# Patient Record
Sex: Male | Born: 1945
Health system: Southern US, Community
[De-identification: ages and names within clinical notes are randomized; demographics above are authoritative.]

## PROBLEM LIST (undated history)

## (undated) DIAGNOSIS — Z800 Family history of malignant neoplasm of digestive organs: Secondary | ICD-10-CM

## (undated) DIAGNOSIS — E785 Hyperlipidemia, unspecified: Secondary | ICD-10-CM

## (undated) DIAGNOSIS — Z8042 Family history of malignant neoplasm of prostate: Secondary | ICD-10-CM

## (undated) DIAGNOSIS — E119 Type 2 diabetes mellitus without complications: Secondary | ICD-10-CM

## (undated) DIAGNOSIS — G709 Myoneural disorder, unspecified: Secondary | ICD-10-CM

## (undated) DIAGNOSIS — N189 Chronic kidney disease, unspecified: Secondary | ICD-10-CM

## (undated) DIAGNOSIS — Z5189 Encounter for other specified aftercare: Secondary | ICD-10-CM

## (undated) DIAGNOSIS — J439 Emphysema, unspecified: Secondary | ICD-10-CM

## (undated) DIAGNOSIS — T7840XA Allergy, unspecified, initial encounter: Secondary | ICD-10-CM

## (undated) DIAGNOSIS — N400 Enlarged prostate without lower urinary tract symptoms: Secondary | ICD-10-CM

## (undated) DIAGNOSIS — M1990 Unspecified osteoarthritis, unspecified site: Secondary | ICD-10-CM

## (undated) DIAGNOSIS — F419 Anxiety disorder, unspecified: Secondary | ICD-10-CM

## (undated) DIAGNOSIS — N200 Calculus of kidney: Secondary | ICD-10-CM

## (undated) DIAGNOSIS — H269 Unspecified cataract: Secondary | ICD-10-CM

## (undated) DIAGNOSIS — I6501 Occlusion and stenosis of right vertebral artery: Secondary | ICD-10-CM

## (undated) DIAGNOSIS — H409 Unspecified glaucoma: Secondary | ICD-10-CM

## (undated) DIAGNOSIS — R011 Cardiac murmur, unspecified: Secondary | ICD-10-CM

## (undated) DIAGNOSIS — R569 Unspecified convulsions: Secondary | ICD-10-CM

## (undated) DIAGNOSIS — C44310 Basal cell carcinoma of skin of unspecified parts of face: Secondary | ICD-10-CM

## (undated) DIAGNOSIS — Z87442 Personal history of urinary calculi: Secondary | ICD-10-CM

## (undated) DIAGNOSIS — K219 Gastro-esophageal reflux disease without esophagitis: Secondary | ICD-10-CM

## (undated) DIAGNOSIS — I6529 Occlusion and stenosis of unspecified carotid artery: Secondary | ICD-10-CM

## (undated) HISTORY — DX: Unspecified glaucoma: H40.9

## (undated) HISTORY — DX: Hyperlipidemia, unspecified: E78.5

## (undated) HISTORY — DX: Type 2 diabetes mellitus without complications: E11.9

## (undated) HISTORY — DX: Family history of malignant neoplasm of digestive organs: Z80.0

## (undated) HISTORY — PX: MOHS SURGERY: SUR867

## (undated) HISTORY — DX: Unspecified osteoarthritis, unspecified site: M19.90

## (undated) HISTORY — DX: Family history of malignant neoplasm of prostate: Z80.42

## (undated) HISTORY — DX: Benign prostatic hyperplasia without lower urinary tract symptoms: N40.0

## (undated) HISTORY — DX: Unspecified convulsions: R56.9

## (undated) HISTORY — DX: Gastro-esophageal reflux disease without esophagitis: K21.9

## (undated) HISTORY — DX: Encounter for other specified aftercare: Z51.89

## (undated) HISTORY — DX: Unspecified cataract: H26.9

## (undated) HISTORY — DX: Calculus of kidney: N20.0

## (undated) HISTORY — DX: Allergy, unspecified, initial encounter: T78.40XA

## (undated) HISTORY — DX: Cardiac murmur, unspecified: R01.1

## (undated) HISTORY — DX: Basal cell carcinoma of skin of unspecified parts of face: C44.310

## (undated) HISTORY — DX: Chronic kidney disease, unspecified: N18.9

## (undated) HISTORY — DX: Anxiety disorder, unspecified: F41.9

## (undated) HISTORY — PX: COLONOSCOPY: SHX174

## (undated) HISTORY — DX: Myoneural disorder, unspecified: G70.9

## (undated) HISTORY — DX: Emphysema, unspecified: J43.9

---

## 2001-06-29 HISTORY — PX: OTHER SURGICAL HISTORY: SHX169

## 2005-11-27 HISTORY — PX: OTHER SURGICAL HISTORY: SHX169

## 2005-11-27 HISTORY — PX: US ECHOCARDIOGRAPHY: HXRAD669

## 2006-06-29 HISTORY — PX: CATARACT EXTRACTION: SUR2

## 2006-06-29 HISTORY — PX: HERNIA REPAIR: SHX51

## 2011-06-30 HISTORY — PX: CATARACT EXTRACTION: SUR2

## 2012-01-18 LAB — PSA: PSA: 1.41

## 2012-01-20 ENCOUNTER — Encounter: Payer: Self-pay | Admitting: Family Medicine

## 2012-01-26 ENCOUNTER — Ambulatory Visit (INDEPENDENT_AMBULATORY_CARE_PROVIDER_SITE_OTHER): Payer: Medicare Other | Admitting: Family Medicine

## 2012-01-26 ENCOUNTER — Encounter: Payer: Self-pay | Admitting: Family Medicine

## 2012-01-26 VITALS — BP 122/76 | HR 92 | Temp 98.0°F | Ht 69.0 in | Wt 196.2 lb

## 2012-01-26 DIAGNOSIS — Z8042 Family history of malignant neoplasm of prostate: Secondary | ICD-10-CM

## 2012-01-26 DIAGNOSIS — Z800 Family history of malignant neoplasm of digestive organs: Secondary | ICD-10-CM

## 2012-01-26 DIAGNOSIS — N400 Enlarged prostate without lower urinary tract symptoms: Secondary | ICD-10-CM

## 2012-01-26 DIAGNOSIS — E663 Overweight: Secondary | ICD-10-CM

## 2012-01-26 NOTE — Progress Notes (Signed)
Subjective:    Patient ID: Larry Kent, male    DOB: 1946/02/01, 66 y.o.   MRN: [id number]  HPI CC: new pt to establish  Prior saw [facility] as PCP.  Wanted change.  Sees dermatologist for skin sun damage and urologist for BPH.  Nocturia x2-3.  "Medium" stream.  Denies depression/anhedonia/sadness. No falls in last year.  Caffeine: 2-3 cups coffee/day Lives with wife and adult son, 1 dog Occupation: Warden/ranger Edu: college Activity: golf, no regular exercise Diet: good amt water, daily fruits/vegetables  Preventative: Last CPE was 4 yrs ago.  Due for this. Prostate screening - sees Eskridge at [facility].  Normal exam in past.  Earlier this month PSA 1.41. Colon screening - had colonoscopy 2010, told rpt in 5 yrs but no polyps?  [facility]. Tetanus 2002  Wt Readings from Last 3 Encounters:  01/26/12 196 lb 4 oz (89.018 kg)  Body mass index is 28.98 kg/(m^2).  Medications and allergies reviewed and updated in chart.  Past histories reviewed and updated if relevant as below. There is no problem list on file for this patient.  Past Medical History  Diagnosis Date  . BPH (benign prostatic hypertrophy)     Dr. Mena Goes @ [facility]  . FHx: prostate cancer    Past Surgical History  Procedure Date  . Hernia repair 2008    Right  . Testicular biopsy 2003    benign, varicocele  . Cataract extraction 2008    Left  . Cataract extraction 2013    Right   History  Substance Use Topics  . Smoking status: Never Smoker   . Smokeless tobacco: Never Used  . Alcohol Use: Yes     social   Family History  Problem Relation Age of Onset  . Cancer Father 16    prostate  . Cancer Paternal Grandfather 45    prostate  . Coronary artery disease Father 51    MI  . Stroke Mother   . Coronary artery disease Maternal Uncle   . Hypertension Mother   . Diabetes Neg Hx    No Known Allergies No current outpatient prescriptions on file prior to visit.     Review of Systems   Constitutional: Negative for fever, chills, activity change, appetite change, fatigue and unexpected weight change.  HENT: Negative for hearing loss and neck pain.   Eyes: Negative for visual disturbance (recent cataract surgery).  Respiratory: Negative for cough, chest tightness, shortness of breath and wheezing.   Cardiovascular: Negative for chest pain, palpitations and leg swelling.  Gastrointestinal: Negative for nausea, vomiting, abdominal pain, diarrhea, constipation, blood in stool and abdominal distention.  Genitourinary: Negative for hematuria and difficulty urinating.  Musculoskeletal: Negative for myalgias and arthralgias.  Skin: Negative for rash.  Neurological: Negative for dizziness, seizures, syncope and headaches.  Hematological: Does not bruise/bleed easily.  Psychiatric/Behavioral: Negative for dysphoric mood. The patient is not nervous/anxious.        Objective:   Physical Exam  Nursing note and vitals reviewed. Constitutional: Larry Kent is oriented to person, place, and time. Larry Kent appears well-developed and well-nourished. No distress.  HENT:  Head: Normocephalic and atraumatic.  Right Ear: Hearing, tympanic membrane, external ear and ear canal normal.  Left Ear: Hearing, tympanic membrane, external ear and ear canal normal.  Nose: Nose normal.  Mouth/Throat: Oropharynx is clear and moist. No oropharyngeal exudate.  Eyes: Conjunctivae and EOM are normal. Pupils are equal, round, and reactive to light. No scleral icterus.  Neck: Normal range  of motion. Neck supple. No JVD present. Carotid bruit is not present. No thyromegaly present.  Cardiovascular: Normal rate, regular rhythm, normal heart sounds and intact distal pulses.   No murmur heard. Pulses:      Radial pulses are 2+ on the right side, and 2+ on the left side.  Pulmonary/Chest: Effort normal and breath sounds normal. No respiratory distress. Larry Kent has no wheezes. Larry Kent has no rales.  Abdominal: Soft. Bowel sounds are  normal. Larry Kent exhibits no distension and no mass. There is no tenderness. There is no rebound and no guarding.  Musculoskeletal: Normal range of motion. Larry Kent exhibits no edema.  Lymphadenopathy:    Larry Kent has no cervical adenopathy.  Neurological: Larry Kent is alert and oriented to person, place, and time.       CN grossly intact, station and gait intact  Skin: Skin is warm and dry. No rash noted.  Psychiatric: Larry Kent has a normal mood and affect. His behavior is normal. Judgment and thought content normal.      Assessment & Plan:

## 2012-01-26 NOTE — Patient Instructions (Signed)
Call your insurace about the shingles shot to see if it is covered or how much it would cost and where is cheaper (here or pharmacy).  If you want to receive here, call for nurse visit. Return at your convenience fasting for blood work and afterwards for medicare wellness exam. I will request records from Dr. Tiburcio Pea at [facility] to see you today, call us with questions.

## 2012-01-26 NOTE — Assessment & Plan Note (Signed)
Followed by urology. fmhx prostate cancer, last PSA 12/2011 1.41.

## 2012-01-26 NOTE — Assessment & Plan Note (Signed)
Colonoscopy done 2010 per pt recollection, told normal but rpt in 5 yrs given fmhx. Will request records from prior PCP.

## 2012-02-09 ENCOUNTER — Other Ambulatory Visit (INDEPENDENT_AMBULATORY_CARE_PROVIDER_SITE_OTHER): Payer: Medicare Other

## 2012-02-09 DIAGNOSIS — E663 Overweight: Secondary | ICD-10-CM

## 2012-02-09 LAB — LIPID PANEL
Cholesterol: 211 mg/dL — ABNORMAL HIGH (ref 0–200)
HDL: 55.6 mg/dL (ref >39.00)
VLDL: 20.2 mg/dL (ref 0.0–40.0)

## 2012-02-09 LAB — COMPREHENSIVE METABOLIC PANEL
AST: 27 U/L (ref 0–37)
Alkaline Phosphatase: 53 U/L (ref 39–117)
BUN: 14 mg/dL (ref 6–23)
Creatinine, Ser: 0.9 mg/dL (ref 0.4–1.5)
Total Bilirubin: 0.7 mg/dL (ref 0.3–1.2)

## 2012-02-12 ENCOUNTER — Ambulatory Visit (INDEPENDENT_AMBULATORY_CARE_PROVIDER_SITE_OTHER): Payer: Medicare Other | Admitting: Family Medicine

## 2012-02-12 ENCOUNTER — Encounter: Payer: Self-pay | Admitting: Family Medicine

## 2012-02-12 VITALS — BP 130/78 | HR 72 | Temp 97.5°F | Ht 69.0 in | Wt 196.2 lb

## 2012-02-12 DIAGNOSIS — E785 Hyperlipidemia, unspecified: Secondary | ICD-10-CM | POA: Insufficient documentation

## 2012-02-12 DIAGNOSIS — Z23 Encounter for immunization: Secondary | ICD-10-CM

## 2012-02-12 DIAGNOSIS — N400 Enlarged prostate without lower urinary tract symptoms: Secondary | ICD-10-CM

## 2012-02-12 DIAGNOSIS — Z0000 Encounter for general adult medical examination without abnormal findings: Secondary | ICD-10-CM | POA: Insufficient documentation

## 2012-02-12 NOTE — Assessment & Plan Note (Signed)
Followed by urology.   

## 2012-02-12 NOTE — Patient Instructions (Addendum)
Good to see you today, call us with questions. Return in 1 year for next physical. Handout on advanced directives provided today. Tetanus and pneumonia shots today. Work on aerobic exercise and weight loss to help cholesterol levels.  If cholesterol continues creeping up, may discuss cholesterol medicine (statin).

## 2012-02-12 NOTE — Progress Notes (Signed)
Subjective:    Patient ID: Larry Kent, male    DOB: January 12, 1946, 66 y.o.   MRN: [id number]  HPI CC: medicare wellness visit  occasional back pain, resolved with ice. Body mass index is 28.98 kg/(m^2).  Wt Readings from Last 3 Encounters:  02/12/12 196 lb 4 oz (89.018 kg)  01/26/12 196 lb 4 oz (89.018 kg)    Normal hearing today. Vision - failed R eye.  S/p cataract extraction.  R eye - just had surgery.  Hasn't had corrective vision surgery.  R eye up close, L eye used for distance.  Sees Dr. Sherryle Lis ([place]).  Preventative:  No recent medicare wellness visit. Prostate screening - sees Eskridge at [facility]. Normal exam in past. Earlier this month PSA 1.41.  H/o BPH, fmhx prostate cancer. Colon screening - had colonoscopy 2010, told rpt in 5 yrs but no polyps? [facility].  Tetanus 2002 - would like today. Pneumovax - would like today. Shingles shot - would like to wait for this today. Advanced directives: has not discussed in past.  Thinks would want spouse to be HCPOA.  Falls - denies in last year. Denies depression/anhedonia.  Caffeine: 2-3 cups coffee/day Lives with wife and adult son, 1 dog Occupation: Warden/ranger Edu: college Activity: golf, no regular exercise Diet: good amt water, daily fruits/vegetables, fish several times a week  Medications and allergies reviewed and updated in chart.  Past histories reviewed and updated if relevant as below. Patient Active Problem List  Diagnosis  . BPH (benign prostatic hypertrophy)  . FHx: prostate cancer  . FHx: colon cancer   Past Medical History  Diagnosis Date  . BPH (benign prostatic hypertrophy)     Dr. Mena Goes @ [facility]  . FHx: prostate cancer   . FHx: colon cancer    Past Surgical History  Procedure Date  . Hernia repair 2008    Right  . Testicular biopsy 2003    benign, varicocele  . Cataract extraction 2008    Left  . Cataract extraction 2013    Right ([facility])  . Cardiovascular stress test  ~2009    WNL  . Colonoscopy ~2010    WNL per pt, rec rpt 5 yrs given fmhx ([facility])   History  Substance Use Topics  . Smoking status: Never Smoker   . Smokeless tobacco: Never Used  . Alcohol Use: Yes     social   Family History  Problem Relation Age of Onset  . Cancer Father 73    prostate  . Cancer Paternal Grandfather 27    prostate  . Coronary artery disease Father 44    MI  . Stroke Mother   . Coronary artery disease Maternal Uncle   . Hypertension Mother   . Diabetes Neg Hx   . Cancer Mother 82    colon  . Cancer Sister 40    breast   No Known Allergies Current Outpatient Prescriptions on File Prior to Visit  Medication Sig Dispense Refill  . aspirin 81 MG tablet Take 81 mg by mouth daily.      . Multiple Vitamin (MULTIVITAMIN) tablet Take 1 tablet by mouth daily.         Review of Systems  Constitutional: Negative for fever, chills, activity change, appetite change, fatigue and unexpected weight change.  HENT: Negative for hearing loss and neck pain.   Eyes: Negative for visual disturbance.  Respiratory: Negative for cough, chest tightness, shortness of breath and wheezing.   Cardiovascular: Negative for  chest pain, palpitations and leg swelling.  Gastrointestinal: Negative for nausea, vomiting, abdominal pain, diarrhea, constipation, blood in stool and abdominal distention.  Genitourinary: Negative for hematuria and difficulty urinating.  Musculoskeletal: Positive for back pain (lower, chronic). Negative for myalgias and arthralgias.  Skin: Negative for rash.  Neurological: Negative for dizziness, seizures, syncope and headaches.  Hematological: Does not bruise/bleed easily.  Psychiatric/Behavioral: Negative for dysphoric mood. The patient is not nervous/anxious.        Objective:   Physical Exam  Nursing note and vitals reviewed. Constitutional: He is oriented to person, place, and time. He appears well-developed and well-nourished. No distress.    HENT:  Head: Normocephalic and atraumatic.  Right Ear: Hearing, tympanic membrane, external ear and ear canal normal.  Left Ear: Hearing, tympanic membrane, external ear and ear canal normal.  Nose: Nose normal.  Mouth/Throat: Oropharynx is clear and moist. No oropharyngeal exudate.  Eyes: Conjunctivae and EOM are normal. Pupils are equal, round, and reactive to light. No scleral icterus.  Neck: Normal range of motion. Neck supple. Carotid bruit is not present.  Cardiovascular: Normal rate, regular rhythm, normal heart sounds and intact distal pulses.   No murmur heard. Pulses:      Radial pulses are 2+ on the right side, and 2+ on the left side.  Pulmonary/Chest: Effort normal and breath sounds normal. No respiratory distress. He has no wheezes. He has no rales.  Abdominal: Soft. Bowel sounds are normal. He exhibits no distension and no mass. There is no tenderness. There is no rebound and no guarding.  Genitourinary:       deferred  Musculoskeletal: Normal range of motion. He exhibits no edema.  Lymphadenopathy:    He has no cervical adenopathy.  Neurological: He is alert and oriented to person, place, and time.       CN grossly intact, station and gait intact  Neg tinel, phalen on left  Skin: Skin is warm and dry. No rash noted.  Psychiatric: He has a normal mood and affect. His behavior is normal. Judgment and thought content normal.       Assessment & Plan:

## 2012-02-12 NOTE — Assessment & Plan Note (Addendum)
LDL 140s, discussed goal <!30, ideally <100 given fmhx CAD. Discussed healthy diet and lifestyle changes to improve. Recommended increased aerobic exercise. Sounds like has good diet currently, declined low chol diet handout. framingham risk 12%.

## 2012-02-12 NOTE — Addendum Note (Signed)
Addended by: Josph Macho A on: 02/12/2012 09:48 AM   Modules accepted: Orders

## 2012-02-12 NOTE — Assessment & Plan Note (Signed)
I have personally reviewed the Medicare Annual Wellness questionnaire and have noted 1. The patient's medical and social history 2. Their use of alcohol, tobacco or illicit drugs 3. Their current medications and supplements 4. The patient's functional ability including ADL's, fall risks, home safety risks and hearing or visual impairment. 5. Diet and physical activity 6. Evidence for depression or mood disorders The patients weight, height, BMI have been recorded in the chart.  Hearing and vision has been addressed. I have made referrals, counseling and provided education to the patient based review of the above and I have provided the pt with a written personalized care plan for preventive services. See scanned questionairre. Advanced directives discussed: thinks would want wife as HCPOA.  Provided with packet of information this year.  Reviewed preventative protocols and updated unless pt declined.  UTD colonoscopy, prostate screening with uro yearly (last saw last month). Pneumovax and Td today. Followed regularly but ophthalmologist.

## 2012-03-20 ENCOUNTER — Encounter: Payer: Self-pay | Admitting: Family Medicine

## 2012-06-29 DIAGNOSIS — C44310 Basal cell carcinoma of skin of unspecified parts of face: Secondary | ICD-10-CM

## 2012-06-29 HISTORY — DX: Basal cell carcinoma of skin of unspecified parts of face: C44.310

## 2012-09-27 DIAGNOSIS — N200 Calculus of kidney: Secondary | ICD-10-CM

## 2012-09-27 HISTORY — DX: Calculus of kidney: N20.0

## 2012-12-27 ENCOUNTER — Encounter: Payer: Self-pay | Admitting: Family Medicine

## 2013-01-27 ENCOUNTER — Encounter: Payer: Self-pay | Admitting: Family Medicine

## 2013-04-21 ENCOUNTER — Encounter: Payer: Self-pay | Admitting: Family Medicine

## 2013-04-21 ENCOUNTER — Ambulatory Visit (INDEPENDENT_AMBULATORY_CARE_PROVIDER_SITE_OTHER): Payer: Medicare Other | Admitting: Family Medicine

## 2013-04-21 VITALS — BP 118/78 | HR 68 | Temp 97.7°F | Ht 69.0 in | Wt 193.8 lb

## 2013-04-21 DIAGNOSIS — N400 Enlarged prostate without lower urinary tract symptoms: Secondary | ICD-10-CM

## 2013-04-21 DIAGNOSIS — Z125 Encounter for screening for malignant neoplasm of prostate: Secondary | ICD-10-CM

## 2013-04-21 DIAGNOSIS — E785 Hyperlipidemia, unspecified: Secondary | ICD-10-CM

## 2013-04-21 DIAGNOSIS — Z0000 Encounter for general adult medical examination without abnormal findings: Secondary | ICD-10-CM

## 2013-04-21 LAB — BASIC METABOLIC PANEL
BUN: 16 mg/dL (ref 6–23)
CO2: 27 mEq/L (ref 19–32)
Chloride: 103 mEq/L (ref 96–112)
Creatinine, Ser: 1 mg/dL (ref 0.4–1.5)
Glucose, Bld: 97 mg/dL (ref 70–99)

## 2013-04-21 LAB — LIPID PANEL
Cholesterol: 176 mg/dL (ref 0–200)
LDL Cholesterol: 114 mg/dL — ABNORMAL HIGH (ref 0–99)
Triglycerides: 78 mg/dL (ref 0.0–149.0)

## 2013-04-21 LAB — PSA, MEDICARE: PSA: 1.46 ng/ml (ref 0.10–4.00)

## 2013-04-21 NOTE — Assessment & Plan Note (Signed)
I have personally reviewed the Medicare Annual Wellness questionnaire and have noted 1. The patient's medical and social history 2. Their use of alcohol, tobacco or illicit drugs 3. Their current medications and supplements 4. The patient's functional ability including ADL's, fall risks, home safety risks and hearing or visual impairment. 5. Diet and physical activity 6. Evidence for depression or mood disorders The patients weight, height, BMI have been recorded in the chart.  Hearing and vision has been addressed. I have made referrals, counseling and provided education to the patient based review of the above and I have provided the pt with a written personalized care plan for preventive services. See scanned questionairre. Advanced directives discussed: packet provided.  Would want wife to be HCPOA.  Reviewed preventative protocols and updated unless pt declined. UTD colonsocopy - will see urology for prostate screening. Declines flu shot today. Will check on zostavax.

## 2013-04-21 NOTE — Patient Instructions (Signed)
Call your insurance about the shingles shot to see if it is covered or how much it would cost and where is cheaper (here or pharmacy).  If you want to receive here, call for nurse visit.  Advanced directive packet today. Good to see you today, call us with questions Blood work today - we will fax results of prostate test to Dr. Mena Goes.

## 2013-04-21 NOTE — Progress Notes (Signed)
Subjective:    Patient ID: Larry Kent, male    DOB: 06/24/1946, 67 y.o.   MRN: [id number]  HPI CC: medicare wellness visit  H/o basal cell skin cancers. Taking vitamin E 300mg .  Normal hearing today.  Vision - failed R eye. S/p cataract extraction. R eye up close, L eye used for distance. Sees Dr. Sherryle Lis ([place]).  Falls - denies in last year.  Denies depression/anhedonia.   Preventative: Prostate screening - sees Eskridge yearly at [facility]. H/o BPH, fmhx prostate cancer. H/o kidney stones as well.  Colon screening - had colonoscopy 2010, told rpt in 5 yrs given fmhx - [facility].  Flu shot - declines Tetanus - 2013 Pneumovax - 2013 Shingles shot - would like to wait for this today.  Advanced directives: has not discussed in past. Thinks would want spouse to be HCPOA.   Caffeine: 2-3 cups coffee/day Lives with wife and adult son, 1 dog Occupation: Warden/ranger Edu: college Activity: golf, no regular exercise, occasionally walks with wife Diet: good amt water, daily fruits/vegetables, fish several times a week  Medications and allergies reviewed and updated in chart.  Past histories reviewed and updated if relevant as below. Patient Active Problem List   Diagnosis Date Noted  . Medicare annual wellness visit, initial 02/12/2012  . HLD (hyperlipidemia) 02/12/2012  . BPH (benign prostatic hypertrophy)   . FHx: prostate cancer   . FHx: colon cancer    Past Medical History  Diagnosis Date  . BPH (benign prostatic hypertrophy)     Dr. Mena Goes @ [facility]  . FHx: prostate cancer   . FHx: colon cancer   . BCC (basal cell carcinoma), face 2014    L preauricular s/p MOHS   Past Surgical History  Procedure Laterality Date  . Hernia repair  2008    Right  . Testicular biopsy  2003    benign, varicocele  . Cataract extraction  2008    Left  . Cataract extraction  2013    Right ([facility])  . Exercise treadmill  11/2005    WNL [facility])  . Colonoscopy  ~2010   medium int hemorrhoids, o/w WNL, rpt 5 yrs given fmhx (Dr. Randa Evens)  . US echocardiography  11/2005    aortic sclerosis, EF 55-60%, diastolic dysfunction   History  Substance Use Topics  . Smoking status: Never Smoker   . Smokeless tobacco: Never Used  . Alcohol Use: Yes     Comment: social   Family History  Problem Relation Age of Onset  . Cancer Father 32    prostate  . Cancer Paternal Grandfather 48    prostate  . Coronary artery disease Father 32    MI  . Stroke Mother   . Coronary artery disease Maternal Uncle   . Hypertension Mother   . Diabetes Neg Hx   . Cancer Mother 72    colon  . Cancer Sister 22    breast   No Known Allergies Current Outpatient Prescriptions on File Prior to Visit  Medication Sig Dispense Refill  . aspirin 81 MG tablet Take 81 mg by mouth daily.      . Multiple Vitamin (MULTIVITAMIN) tablet Take 1 tablet by mouth daily.       No current facility-administered medications on file prior to visit.      Review of Systems  Constitutional: Negative for fever, chills, activity change, appetite change, fatigue and unexpected weight change.  HENT: Negative for hearing loss.   Eyes: Negative for  visual disturbance.  Respiratory: Negative for cough, chest tightness, shortness of breath and wheezing.   Cardiovascular: Negative for chest pain, palpitations and leg swelling.  Gastrointestinal: Negative for nausea, vomiting, abdominal pain, diarrhea, constipation, blood in stool and abdominal distention.       Gi bug <24 hours 2 wks ago  Genitourinary: Negative for hematuria and difficulty urinating.  Musculoskeletal: Negative for arthralgias, myalgias and neck pain.  Skin: Negative for rash.  Neurological: Negative for dizziness, seizures, syncope and headaches.  Hematological: Negative for adenopathy. Does not bruise/bleed easily.  Psychiatric/Behavioral: Negative for dysphoric mood. The patient is not nervous/anxious.        Objective:   Physical  Exam  Nursing note and vitals reviewed. Constitutional: He is oriented to person, place, and time. He appears well-developed and well-nourished. No distress.  HENT:  Head: Normocephalic and atraumatic.  Right Ear: External ear normal.  Left Ear: External ear normal.  Nose: Nose normal.  Mouth/Throat: Oropharynx is clear and moist. No oropharyngeal exudate.  Cerumen bilaterally  Eyes: Conjunctivae and EOM are normal. Pupils are equal, round, and reactive to light. No scleral icterus.  Neck: Normal range of motion. Neck supple. No thyromegaly present.  Cardiovascular: Normal rate, regular rhythm and intact distal pulses.   Murmur (early 2/6 systolic murmur best at LUSB) heard. Pulses:      Radial pulses are 2+ on the right side, and 2+ on the left side.  Pulmonary/Chest: Effort normal and breath sounds normal. No respiratory distress. He has no wheezes. He has no rales.  Abdominal: Soft. Bowel sounds are normal. He exhibits no distension and no mass. There is no tenderness. There is no rebound and no guarding.  Genitourinary:  deferred  Musculoskeletal: Normal range of motion. He exhibits no edema.  Lymphadenopathy:    He has no cervical adenopathy.  Neurological: He is alert and oriented to person, place, and time.  CN grossly intact, station and gait intact  Skin: Skin is warm and dry. No rash noted.  Psychiatric: He has a normal mood and affect. His behavior is normal. Judgment and thought content normal.       Assessment & Plan:

## 2013-04-21 NOTE — Assessment & Plan Note (Signed)
Recheck FLP today as fasting. 

## 2013-04-21 NOTE — Assessment & Plan Note (Signed)
Followed by urology - pt declines check today - states will schedule appt with Dr. Mena Goes.  We will fax today's PSA reading to urology.

## 2013-04-24 ENCOUNTER — Encounter: Payer: Self-pay | Admitting: *Deleted

## 2013-06-20 ENCOUNTER — Encounter: Payer: Self-pay | Admitting: Family Medicine

## 2014-05-09 ENCOUNTER — Telehealth (HOSPITAL_COMMUNITY): Payer: Self-pay | Admitting: *Deleted

## 2014-05-09 NOTE — Telephone Encounter (Signed)
Spoke with pt and ok to schedule him for AWV due for this year 2015.

## 2014-05-11 NOTE — Telephone Encounter (Signed)
Message left for patient to call the office and schedule appt.

## 2014-05-16 ENCOUNTER — Other Ambulatory Visit: Payer: Self-pay | Admitting: Family Medicine

## 2014-05-16 ENCOUNTER — Other Ambulatory Visit (INDEPENDENT_AMBULATORY_CARE_PROVIDER_SITE_OTHER): Payer: Medicare Other

## 2014-05-16 DIAGNOSIS — E785 Hyperlipidemia, unspecified: Secondary | ICD-10-CM

## 2014-05-16 DIAGNOSIS — N400 Enlarged prostate without lower urinary tract symptoms: Secondary | ICD-10-CM

## 2014-05-16 LAB — BASIC METABOLIC PANEL
BUN: 15 mg/dL (ref 6–23)
CHLORIDE: 103 meq/L (ref 96–112)
CO2: 28 mEq/L (ref 19–32)
Calcium: 9.1 mg/dL (ref 8.4–10.5)
Creatinine, Ser: 1 mg/dL (ref 0.4–1.5)
GFR: 77.04 mL/min (ref >60.00)
GLUCOSE: 102 mg/dL — AB (ref 70–99)
Potassium: 4.5 mEq/L (ref 3.5–5.1)
Sodium: 138 mEq/L (ref 135–145)

## 2014-05-16 LAB — LIPID PANEL
CHOLESTEROL: 234 mg/dL — AB (ref 0–200)
HDL: 53.8 mg/dL (ref >39.00)
LDL Cholesterol: 157 mg/dL — ABNORMAL HIGH (ref 0–99)
NonHDL: 180.2
TRIGLYCERIDES: 114 mg/dL (ref 0.0–149.0)
Total CHOL/HDL Ratio: 4
VLDL: 22.8 mg/dL (ref 0.0–40.0)

## 2014-05-16 LAB — PSA: PSA: 1.19 ng/mL (ref 0.10–4.00)

## 2014-05-23 ENCOUNTER — Ambulatory Visit (INDEPENDENT_AMBULATORY_CARE_PROVIDER_SITE_OTHER): Payer: Medicare Other | Admitting: Family Medicine

## 2014-05-23 ENCOUNTER — Encounter: Payer: Self-pay | Admitting: Family Medicine

## 2014-05-23 VITALS — BP 118/74 | HR 72 | Temp 97.9°F | Ht 69.0 in | Wt 195.5 lb

## 2014-05-23 DIAGNOSIS — E785 Hyperlipidemia, unspecified: Secondary | ICD-10-CM

## 2014-05-23 DIAGNOSIS — M1711 Unilateral primary osteoarthritis, right knee: Secondary | ICD-10-CM | POA: Insufficient documentation

## 2014-05-23 DIAGNOSIS — Z7189 Other specified counseling: Secondary | ICD-10-CM | POA: Insufficient documentation

## 2014-05-23 DIAGNOSIS — R739 Hyperglycemia, unspecified: Secondary | ICD-10-CM

## 2014-05-23 DIAGNOSIS — Z23 Encounter for immunization: Secondary | ICD-10-CM

## 2014-05-23 DIAGNOSIS — R7303 Prediabetes: Secondary | ICD-10-CM | POA: Insufficient documentation

## 2014-05-23 DIAGNOSIS — M2550 Pain in unspecified joint: Secondary | ICD-10-CM

## 2014-05-23 DIAGNOSIS — Z0000 Encounter for general adult medical examination without abnormal findings: Secondary | ICD-10-CM

## 2014-05-23 NOTE — Patient Instructions (Addendum)
Flu shot today. prevnar today. Call your insurance about the shingles shot to see if it is covered or how much it would cost and where is cheaper (here or pharmacy).  If you want to receive here, call for nurse visit. Call to schedule colonoscopy ([phone] with [facility]). Advanced directive packet provided today. Low cholesterol diet handout. Look into mediterranean diet. Return in 6 months for recheck fasting labs.

## 2014-05-23 NOTE — Progress Notes (Signed)
Pre visit review using our clinic review tool, if applicable. No additional management support is needed unless otherwise documented below in the visit note. 

## 2014-05-23 NOTE — Assessment & Plan Note (Signed)
Reviewed FLP with patient and discussed dietary and lifestyle choices to improve cholesterol levels. Low chol diet handout provided. Recheck levels in 6 months and decide on statin at that time. LDL goal given fmhx likely <100.

## 2014-05-23 NOTE — Progress Notes (Signed)
BP 118/74 mmHg  Pulse 72  Temp(Src) 97.9 F (36.6 C) (Oral)  Ht 5\' 9"  (1.753 m)  Wt 195 lb 8 oz (88.678 kg)  BMI 28.86 kg/m2   CC: medicare wellness  Subjective:    Patient ID: Larry Kent, Larry Kent    DOB: [birth year], 68 y.o.   MRN: [id number]  HPI: Larry Kent is a 68 y.o. Larry Kent presenting on 05/23/2014 for Annual Exam   Occasional joint pains treated with naprosyn. Notes worse on days he's more active. Endorses R shoulder pain, lower back, knees. Has seen PT for this 10 yrs ago. Known h/o compacted vertebrae.   Normal hearing today. Vision screen with ophtho done last week S/p cataract surgery with lens implants. R eye for reading, L eye used for distance. Sees Dr. Arnoldo Morale in [place]. [place] - denies in last year. Denies depression/anhedonia.  Preventative: Prostate screening - sees Dr Junious Silk yearly at [place]. H/o BPH, fmhx prostate cancer. H/o kidney stones as well. COLONOSCOPY Date: ~2010 medium int hemorrhoids, o/w WNL, rpt 5 yrs given fmhx (Dr. Oletta Lamas). Flu shot - today Tetanus - 2013 Pneumovax - 2013, prevnar today Shingles shot - will check on this Advanced directives: packet provided today. Would want wife to be HCPOA.  Caffeine: 2-3 cups coffee/day Lives with wife and adult son, 1 dog Occupation: Counsellor Edu: college Activity: golf, no regular exercise, occasionally walks with wife Diet: good amt water, daily fruits/vegetables, fish several times a week  Relevant past medical, surgical, family and social history reviewed and updated as indicated.  Allergies and medications reviewed and updated. Current Outpatient Prescriptions on File Prior to Visit  Medication Sig  . aspirin 81 MG tablet Take 81 mg by mouth daily.  . Multiple Vitamin (MULTIVITAMIN) tablet Take 1 tablet by mouth daily.   No current facility-administered medications on file prior to visit.    Review of Systems  Constitutional: Negative for fever, chills, activity  change, appetite change, fatigue and unexpected weight change.  HENT: Negative for hearing loss.   Eyes: Negative for visual disturbance.  Respiratory: Negative for cough, chest tightness, shortness of breath and wheezing.   Cardiovascular: Negative for chest pain, palpitations and leg swelling.  Gastrointestinal: Negative for nausea, vomiting, abdominal pain, diarrhea, constipation, blood in stool and abdominal distention.  Genitourinary: Negative for hematuria and difficulty urinating.  Musculoskeletal: Negative for myalgias, arthralgias and neck pain.  Skin: Negative for rash.  Neurological: Negative for dizziness, seizures, syncope and headaches.  Hematological: Negative for adenopathy. Does not bruise/bleed easily.  Psychiatric/Behavioral: Negative for dysphoric mood. The patient is not nervous/anxious.    Per HPI unless specifically indicated above    Objective:    BP 118/74 mmHg  Pulse 72  Temp(Src) 97.9 F (36.6 C) (Oral)  Ht 5\' 9"  (1.753 m)  Wt 195 lb 8 oz (88.678 kg)  BMI 28.86 kg/m2  Physical Exam  Constitutional: He is oriented to person, place, and time. He appears well-developed and well-nourished. No distress.  HENT:  Head: Normocephalic and atraumatic.  Right Ear: Hearing, tympanic membrane, external ear and ear canal normal.  Left Ear: Hearing, tympanic membrane, external ear and ear canal normal.  Nose: Nose normal.  Mouth/Throat: Uvula is midline, oropharynx is clear and moist and mucous membranes are normal. No oropharyngeal exudate, posterior oropharyngeal edema or posterior oropharyngeal erythema.  Eyes: Conjunctivae and EOM are normal. Pupils are equal, round, and reactive to light. No scleral icterus.  Neck: Normal range of motion. Neck supple. Carotid  bruit is not present. No thyromegaly present.  Cardiovascular: Normal rate, regular rhythm, normal heart sounds and intact distal pulses.   No murmur heard. Pulses:      Radial pulses are 2+ on the right  side, and 2+ on the left side.  Pulmonary/Chest: Effort normal and breath sounds normal. No respiratory distress. He has no wheezes. He has no rales.  Abdominal: Soft. Bowel sounds are normal. He exhibits no distension and no mass. There is no tenderness. There is no rebound and no guarding.  Genitourinary:  Deferred - per urology  Musculoskeletal: Normal range of motion. He exhibits no edema.  Lymphadenopathy:    He has no cervical adenopathy.  Neurological: He is alert and oriented to person, place, and time.  CN grossly intact, station and gait intact Recall 3/3 Calculation 5/5 serial 7s   Skin: Skin is warm and dry. No rash noted.  Psychiatric: He has a normal mood and affect. His behavior is normal. Judgment and thought content normal.  Nursing note and vitals reviewed.  Results for orders placed or performed in visit on 05/16/14  Lipid panel  Result Value Ref Range   Cholesterol 234 (H) 0 - 200 mg/dL   Triglycerides 114.0 0.0 - 149.0 mg/dL   HDL 53.80 >39.00 mg/dL   VLDL 22.8 0.0 - 40.0 mg/dL   LDL Cholesterol 157 (H) 0 - 99 mg/dL   Total CHOL/HDL Ratio 4    NonHDL 782.95   Basic metabolic panel  Result Value Ref Range   Sodium 138 135 - 145 mEq/L   Potassium 4.5 3.5 - 5.1 mEq/L   Chloride 103 96 - 112 mEq/L   CO2 28 19 - 32 mEq/L   Glucose, Bld 102 (H) 70 - 99 mg/dL   BUN 15 6 - 23 mg/dL   Creatinine, Ser 1.0 0.4 - 1.5 mg/dL   Calcium 9.1 8.4 - 10.5 mg/dL   GFR 77.04 >60.00 mL/min  PSA  Result Value Ref Range   PSA 1.19 0.10 - 4.00 ng/mL      Assessment & Plan:   Problem List Items Addressed This Visit    Medicare annual wellness visit, subsequent - Primary    I have personally reviewed the Medicare Annual Wellness questionnaire and have noted 1. The patient's medical and social history 2. Their use of alcohol, tobacco or illicit drugs 3. Their current medications and supplements 4. The patient's functional ability including ADL's, fall risks, home safety  risks and hearing or visual impairment. 5. Diet and physical activity 6. Evidence for depression or mood disorders The patients weight, height, BMI have been recorded in the chart.  Hearing and vision has been addressed. I have made referrals, counseling and provided education to the patient based review of the above and I have provided the pt with a written personalized care plan for preventive services. Provider list updated - see scanned questionairre.  Reviewed preventative protocols and updated unless pt declined.    Hyperglycemia    Reviewed with patient. rec decreased added sugars.    HLD (hyperlipidemia)    Reviewed FLP with patient and discussed dietary and lifestyle choices to improve cholesterol levels. Low chol diet handout provided. Recheck levels in 6 months and decide on statin at that time. LDL goal given fmhx likely <100.    Health maintenance examination    Preventative protocols reviewed and updated unless pt declined. Discussed healthy diet and lifestyle.    Arthralgia    Discussed vit D and glucosamine use.  Advanced care planning/counseling discussion    Advanced directives: packet provided today. Would want wife to be HCPOA.     Other Visit Diagnoses    Need for influenza vaccination        Relevant Orders       Flu Vaccine QUAD 36+ mos PF IM (Fluarix Quad PF) (Completed)        Follow up plan: Return in about 1 year (around 05/24/2015), or as needed, for medicare wellness.

## 2014-05-23 NOTE — Assessment & Plan Note (Signed)

## 2014-05-23 NOTE — Assessment & Plan Note (Signed)
Reviewed with patient. rec decreased added sugars.

## 2014-05-23 NOTE — Addendum Note (Signed)
Addended by: Royann Shivers A on: 05/23/2014 12:00 PM   Modules accepted: Orders

## 2014-05-23 NOTE — Assessment & Plan Note (Signed)
Preventative protocols reviewed and updated unless pt declined. Discussed healthy diet and lifestyle.  

## 2014-05-23 NOTE — Assessment & Plan Note (Signed)
Advanced directives: packet provided today. Would want wife to be HCPOA.

## 2014-05-23 NOTE — Assessment & Plan Note (Signed)
Discussed vit D and glucosamine use.

## 2014-07-10 DIAGNOSIS — H26491 Other secondary cataract, right eye: Secondary | ICD-10-CM | POA: Diagnosis not present

## 2014-11-20 DIAGNOSIS — M65872 Other synovitis and tenosynovitis, left ankle and foot: Secondary | ICD-10-CM | POA: Diagnosis not present

## 2014-11-20 DIAGNOSIS — M7672 Peroneal tendinitis, left leg: Secondary | ICD-10-CM | POA: Diagnosis not present

## 2014-11-20 DIAGNOSIS — M722 Plantar fascial fibromatosis: Secondary | ICD-10-CM | POA: Diagnosis not present

## 2014-11-20 DIAGNOSIS — M79609 Pain in unspecified limb: Secondary | ICD-10-CM | POA: Diagnosis not present

## 2014-11-20 DIAGNOSIS — M7752 Other enthesopathy of left foot: Secondary | ICD-10-CM | POA: Diagnosis not present

## 2014-11-21 ENCOUNTER — Other Ambulatory Visit (INDEPENDENT_AMBULATORY_CARE_PROVIDER_SITE_OTHER): Payer: Medicare Other

## 2014-11-21 ENCOUNTER — Other Ambulatory Visit: Payer: Self-pay | Admitting: Family Medicine

## 2014-11-21 DIAGNOSIS — E785 Hyperlipidemia, unspecified: Secondary | ICD-10-CM

## 2014-11-21 LAB — LIPID PANEL
Cholesterol: 202 mg/dL — ABNORMAL HIGH (ref 0–200)
HDL: 47.8 mg/dL (ref >39.00)
LDL CALC: 127 mg/dL — AB (ref 0–99)
NonHDL: 154.2
Total CHOL/HDL Ratio: 4
Triglycerides: 135 mg/dL (ref 0.0–149.0)
VLDL: 27 mg/dL (ref 0.0–40.0)

## 2014-11-21 LAB — BASIC METABOLIC PANEL
BUN: 16 mg/dL (ref 6–23)
CO2: 28 mEq/L (ref 19–32)
Calcium: 8.9 mg/dL (ref 8.4–10.5)
Chloride: 104 mEq/L (ref 96–112)
Creatinine, Ser: 1.01 mg/dL (ref 0.40–1.50)
GFR: 77.8 mL/min (ref >60.00)
Glucose, Bld: 104 mg/dL — ABNORMAL HIGH (ref 70–99)
POTASSIUM: 4.1 meq/L (ref 3.5–5.1)
Sodium: 136 mEq/L (ref 135–145)

## 2014-11-21 LAB — TSH: TSH: 2.03 u[IU]/mL (ref 0.35–4.50)

## 2014-11-22 ENCOUNTER — Encounter: Payer: Self-pay | Admitting: *Deleted

## 2015-02-05 DIAGNOSIS — R3912 Poor urinary stream: Secondary | ICD-10-CM | POA: Diagnosis not present

## 2015-02-05 DIAGNOSIS — N138 Other obstructive and reflux uropathy: Secondary | ICD-10-CM | POA: Diagnosis not present

## 2015-02-05 DIAGNOSIS — N401 Enlarged prostate with lower urinary tract symptoms: Secondary | ICD-10-CM | POA: Diagnosis not present

## 2015-05-25 ENCOUNTER — Other Ambulatory Visit: Payer: Self-pay | Admitting: Family Medicine

## 2015-05-25 DIAGNOSIS — E785 Hyperlipidemia, unspecified: Secondary | ICD-10-CM

## 2015-05-25 DIAGNOSIS — Z1159 Encounter for screening for other viral diseases: Secondary | ICD-10-CM

## 2015-05-25 DIAGNOSIS — N400 Enlarged prostate without lower urinary tract symptoms: Secondary | ICD-10-CM

## 2015-05-27 ENCOUNTER — Other Ambulatory Visit: Payer: Self-pay | Admitting: Family Medicine

## 2015-05-27 ENCOUNTER — Other Ambulatory Visit (INDEPENDENT_AMBULATORY_CARE_PROVIDER_SITE_OTHER): Payer: Medicare Other

## 2015-05-27 DIAGNOSIS — Z1159 Encounter for screening for other viral diseases: Secondary | ICD-10-CM | POA: Diagnosis not present

## 2015-05-27 DIAGNOSIS — E785 Hyperlipidemia, unspecified: Secondary | ICD-10-CM | POA: Diagnosis not present

## 2015-05-27 DIAGNOSIS — N400 Enlarged prostate without lower urinary tract symptoms: Secondary | ICD-10-CM | POA: Diagnosis not present

## 2015-05-27 LAB — LIPID PANEL
CHOLESTEROL: 232 mg/dL — AB (ref 0–200)
HDL: 52.9 mg/dL (ref >39.00)
LDL Cholesterol: 152 mg/dL — ABNORMAL HIGH (ref 0–99)
NONHDL: 179.59
Total CHOL/HDL Ratio: 4
Triglycerides: 140 mg/dL (ref 0.0–149.0)
VLDL: 28 mg/dL (ref 0.0–40.0)

## 2015-05-27 LAB — BASIC METABOLIC PANEL
BUN: 14 mg/dL (ref 6–23)
CALCIUM: 9.3 mg/dL (ref 8.4–10.5)
CO2: 30 mEq/L (ref 19–32)
CREATININE: 1.02 mg/dL (ref 0.40–1.50)
Chloride: 104 mEq/L (ref 96–112)
GFR: 76.81 mL/min (ref >60.00)
GLUCOSE: 115 mg/dL — AB (ref 70–99)
POTASSIUM: 4.6 meq/L (ref 3.5–5.1)
Sodium: 139 mEq/L (ref 135–145)

## 2015-05-27 LAB — PSA: PSA: 1.11 ng/mL (ref 0.10–4.00)

## 2015-05-28 LAB — HEPATITIS C ANTIBODY: HCV Ab: NEGATIVE

## 2015-05-29 ENCOUNTER — Encounter: Payer: Medicare Other | Admitting: Family Medicine

## 2015-05-30 DIAGNOSIS — I6502 Occlusion and stenosis of left vertebral artery: Secondary | ICD-10-CM | POA: Insufficient documentation

## 2015-05-30 DIAGNOSIS — I6501 Occlusion and stenosis of right vertebral artery: Secondary | ICD-10-CM

## 2015-05-30 DIAGNOSIS — I6529 Occlusion and stenosis of unspecified carotid artery: Secondary | ICD-10-CM | POA: Insufficient documentation

## 2015-05-30 HISTORY — DX: Occlusion and stenosis of right vertebral artery: I65.01

## 2015-05-30 HISTORY — DX: Occlusion and stenosis of unspecified carotid artery: I65.29

## 2015-06-04 DIAGNOSIS — L812 Freckles: Secondary | ICD-10-CM | POA: Diagnosis not present

## 2015-06-04 DIAGNOSIS — L821 Other seborrheic keratosis: Secondary | ICD-10-CM | POA: Diagnosis not present

## 2015-06-04 DIAGNOSIS — D1801 Hemangioma of skin and subcutaneous tissue: Secondary | ICD-10-CM | POA: Diagnosis not present

## 2015-06-04 DIAGNOSIS — L570 Actinic keratosis: Secondary | ICD-10-CM | POA: Diagnosis not present

## 2015-06-10 ENCOUNTER — Encounter: Payer: Self-pay | Admitting: Family Medicine

## 2015-06-10 ENCOUNTER — Ambulatory Visit (INDEPENDENT_AMBULATORY_CARE_PROVIDER_SITE_OTHER): Payer: Medicare Other | Admitting: Family Medicine

## 2015-06-10 VITALS — BP 128/80 | HR 62 | Temp 97.8°F | Ht 67.5 in | Wt 198.0 lb

## 2015-06-10 DIAGNOSIS — R0989 Other specified symptoms and signs involving the circulatory and respiratory systems: Secondary | ICD-10-CM

## 2015-06-10 DIAGNOSIS — Z23 Encounter for immunization: Secondary | ICD-10-CM

## 2015-06-10 DIAGNOSIS — Z0000 Encounter for general adult medical examination without abnormal findings: Secondary | ICD-10-CM | POA: Diagnosis not present

## 2015-06-10 DIAGNOSIS — E785 Hyperlipidemia, unspecified: Secondary | ICD-10-CM

## 2015-06-10 DIAGNOSIS — M79672 Pain in left foot: Secondary | ICD-10-CM | POA: Insufficient documentation

## 2015-06-10 DIAGNOSIS — Z7189 Other specified counseling: Secondary | ICD-10-CM

## 2015-06-10 DIAGNOSIS — N400 Enlarged prostate without lower urinary tract symptoms: Secondary | ICD-10-CM

## 2015-06-10 DIAGNOSIS — Z1211 Encounter for screening for malignant neoplasm of colon: Secondary | ICD-10-CM

## 2015-06-10 DIAGNOSIS — E669 Obesity, unspecified: Secondary | ICD-10-CM

## 2015-06-10 DIAGNOSIS — R739 Hyperglycemia, unspecified: Secondary | ICD-10-CM

## 2015-06-10 DIAGNOSIS — E663 Overweight: Secondary | ICD-10-CM | POA: Insufficient documentation

## 2015-06-10 DIAGNOSIS — E66811 Obesity, class 1: Secondary | ICD-10-CM

## 2015-06-10 NOTE — Assessment & Plan Note (Signed)
Continue f/u with Dr Junious Silk urologist.

## 2015-06-10 NOTE — Patient Instructions (Addendum)
Flu shot today Pass by lab to pick up stool kit.  Pass by our referral coordinators for carotid ultrasound and sports medicine referral. We will continue to watch cholesterol levels. Return in 6 months for lab visit only to recheck cholesterol levels.  Call your insurance about the shingles shot to see if it is covered or how much it would cost and where is cheaper (here or pharmacy).  If you want to receive here, call for nurse visit.  I recommend mediterranean diet (see below).        Mediterranean Diet  Why follow it? Research shows. . Those who follow the Mediterranean diet have a reduced risk of heart disease  . The diet is associated with a reduced incidence of Parkinson's and Alzheimer's diseases . People following the diet may have longer life expectancies and lower rates of chronic diseases  . The Dietary Guidelines for Americans recommends the Mediterranean diet as an eating plan to promote health and prevent disease  What Is the Mediterranean Diet?  . Healthy eating plan based on typical foods and recipes of Mediterranean-style cooking . The diet is primarily a plant based diet; these foods should make up a majority of meals   Starches - Plant based foods should make up a majority of meals - They are an important sources of vitamins, minerals, energy, antioxidants, and fiber - Choose whole grains, foods high in fiber and minimally processed items  - Typical grain sources include wheat, oats, barley, corn, brown rice, bulgar, farro, millet, polenta, couscous  - Various types of beans include chickpeas, lentils, fava beans, black beans, white beans   Fruits  Veggies - Large quantities of antioxidant rich fruits & veggies; 6 or more servings  - Vegetables can be eaten raw or lightly drizzled with oil and cooked  - Vegetables common to the traditional Mediterranean Diet include: artichokes, arugula, beets, broccoli, brussel sprouts, cabbage, carrots, celery, collard greens,  cucumbers, eggplant, kale, leeks, lemons, lettuce, mushrooms, okra, onions, peas, peppers, potatoes, pumpkin, radishes, rutabaga, shallots, spinach, sweet potatoes, turnips, zucchini - Fruits common to the Mediterranean Diet include: apples, apricots, avocados, cherries, clementines, dates, figs, grapefruits, grapes, melons, nectarines, oranges, peaches, pears, pomegranates, strawberries, tangerines  Fats - Replace butter and margarine with healthy oils, such as olive oil, canola oil, and tahini  - Limit nuts to no more than a handful a day  - Nuts include walnuts, almonds, pecans, pistachios, pine nuts  - Limit or avoid candied, honey roasted or heavily salted nuts - Olives are central to the [employer] - can be eaten whole or used in a variety of dishes   Meats Protein - Limiting red meat: no more than a few times a month - When eating red meat: choose lean cuts and keep the portion to the size of deck of cards - Eggs: approx. 0 to 4 times a week  - Fish and lean poultry: at least 2 a week  - Healthy protein sources include, chicken, [place], lean beef, lamb - Increase intake of seafood such as tuna, salmon, trout, mackerel, shrimp, scallops - Avoid or limit high fat processed meats such as sausage and bacon  Dairy - Include moderate amounts of low fat dairy products  - Focus on healthy dairy such as fat free yogurt, skim milk, low or reduced fat cheese - Limit dairy products higher in fat such as whole or 2% milk, cheese, ice cream  Alcohol - Moderate amounts of red wine is ok  - No  more than 5 oz daily for women (all ages) and men older than age 28  - No more than 10 oz of wine daily for men younger than 78  Other - Limit sweets and other desserts  - Use herbs and spices instead of salt to flavor foods  - Herbs and spices common to the traditional Mediterranean Diet include: basil, bay leaves, chives, cloves, cumin, fennel, garlic, lavender, marjoram, mint, oregano, parsley, pepper,  rosemary, sage, savory, sumac, tarragon, thyme   It's not just a diet, it's a lifestyle:  . The Mediterranean diet includes lifestyle factors typical of those in the region  . Foods, drinks and meals are best eaten with others and savored . Daily physical activity is important for overall good health . This could be strenuous exercise like running and aerobics . This could also be more leisurely activities such as walking, housework, yard-work, or taking the stairs . Moderation is the key; a balanced and healthy diet accommodates most foods and drinks . Consider portion sizes and frequency of consumption of certain foods   Meal Ideas & Options:  . Breakfast:  o Whole wheat toast or whole wheat English muffins with peanut butter & hard boiled egg o Steel cut oats topped with apples & cinnamon and skim milk  o Fresh fruit: banana, strawberries, melon, berries, peaches  o Smoothies: strawberries, bananas, greek yogurt, peanut butter o Low fat greek yogurt with blueberries and granola  o Egg white omelet with spinach and mushrooms o Breakfast couscous: whole wheat couscous, apricots, skim milk, cranberries  . Sandwiches:  o Hummus and grilled vegetables (peppers, zucchini, squash) on whole wheat bread   o Grilled chicken on whole wheat pita with lettuce, tomatoes, cucumbers or tzatziki  o Tuna salad on whole wheat bread: tuna salad made with greek yogurt, olives, red peppers, capers, green onions o Garlic rosemary lamb pita: lamb sauted with garlic, rosemary, salt & pepper; add lettuce, cucumber, greek yogurt to pita - flavor with lemon juice and black pepper  . Seafood:  o Mediterranean grilled salmon, seasoned with garlic, basil, parsley, lemon juice and black pepper o Shrimp, lemon, and spinach whole-grain pasta salad made with low fat greek yogurt  o Seared scallops with lemon orzo  o Seared tuna steaks seasoned salt, pepper, coriander topped with tomato mixture of olives, tomatoes,  olive oil, minced garlic, parsley, green onions and cappers  . Meats:  o Herbed greek chicken salad with kalamata olives, cucumber, feta  o Red bell peppers stuffed with spinach, bulgur, lean ground beef (or lentils) & topped with feta   o Kebabs: skewers of chicken, tomatoes, onions, zucchini, squash  o [place] burgers: made with red onions, mint, dill, lemon juice, feta cheese topped with roasted red peppers . Vegetarian o Cucumber salad: cucumbers, artichoke hearts, celery, red onion, feta cheese, tossed in olive oil & lemon juice  o Hummus and whole grain pita points with a greek salad (lettuce, tomato, feta, olives, cucumbers, red onion) o Lentil soup with celery, carrots made with vegetable broth, garlic, salt and pepper  o Tabouli salad: parsley, bulgur, mint, scallions, cucumbers, tomato, radishes, lemon juice, olive oil, salt and pepper.

## 2015-06-10 NOTE — Assessment & Plan Note (Signed)
Pt states has been diagnosed by podiatrist with foot tendonitis and treated with custom insoles. Requests referral to [facility] for second opinion.

## 2015-06-10 NOTE — Assessment & Plan Note (Signed)
Discussed healthy diet changes to help control this.

## 2015-06-10 NOTE — Progress Notes (Signed)
BP 128/80 mmHg  Pulse 62  Temp(Src) 97.8 F (36.6 C) (Oral)  Ht 5' 7.5" (1.715 m)  Wt 198 lb (89.812 kg)  BMI 30.54 kg/m2  SpO2 97%   CC: medicare wellness visit  Subjective:    Patient ID: Larry Kent, male    DOB: [birth year], 69 y.o.   MRN: [id number]  HPI: Larry Kent is a 69 y.o. male presenting on 06/10/2015 for Annual Exam   Suffering with L foot tendonitis over last 6+ months. Saw podiatrist, recommended temporary insole which helped some but not fully effective. No significant knee pain. Points to lateral foot. Requests SM referral.  Normal hearing today. Vision screen with ophtho done last week  Falls - denies in last year. Denies depression/anhedonia/sadness.  Preventative: Prostate screening - sees Dr Junious Silk yearly at [place]. H/o BPH, fmhx prostate cancer. H/o kidney stones as well. COLONOSCOPY Date: ~2010 medium int hemorrhoids, o/w WNL, rpt 5 yrs given fmhx (Dr. Oletta Lamas) - discussed. Pt requests stool kit today.  Flu shot - today Tetanus - 2013 Pneumovax - 2013, prevnar 2015 Shingles shot - will check on this Advanced directives: packet provided today. Would want wife to be HCPOA.  Seat belt use discussed Sunscreen use discussed. No changing moles on skin. Sees derm.  Caffeine: 2-3 cups coffee/day Lives with wife and adult son, 1 dog Occupation: Counsellor Edu: college Activity: golf, no regular exercise, occasionally walks with wife Diet: good amt water, daily fruits/vegetables, fish several times a week  Relevant past medical, surgical, family and social history reviewed and updated as indicated. Interim medical history since our last visit reviewed. Allergies and medications reviewed and updated. Current Outpatient Prescriptions on File Prior to Visit  Medication Sig  . aspirin 81 MG tablet Take 81 mg by mouth daily.  . Multiple Vitamin (MULTIVITAMIN) tablet Take 1 tablet by mouth daily.  . Omega-3 Fatty Acids (FISH OIL) 1000  MG CAPS Take 1,000 mg by mouth daily.   No current facility-administered medications on file prior to visit.    Review of Systems  Constitutional: Negative for fever, chills, activity change, appetite change, fatigue and unexpected weight change.  HENT: Negative for hearing loss.   Eyes: Negative for visual disturbance.  Respiratory: Negative for cough, chest tightness, shortness of breath and wheezing.   Cardiovascular: Negative for chest pain, palpitations and leg swelling.  Gastrointestinal: Negative for nausea, vomiting, abdominal pain, diarrhea, constipation, blood in stool and abdominal distention.  Genitourinary: Negative for hematuria and difficulty urinating.  Musculoskeletal: Negative for myalgias, arthralgias and neck pain.  Skin: Negative for rash.  Neurological: Negative for dizziness, seizures, syncope and headaches.  Hematological: Negative for adenopathy. Does not bruise/bleed easily.  Psychiatric/Behavioral: Negative for dysphoric mood. The patient is not nervous/anxious.    Per HPI unless specifically indicated in ROS section     Objective:    BP 128/80 mmHg  Pulse 62  Temp(Src) 97.8 F (36.6 C) (Oral)  Ht 5' 7.5" (1.715 m)  Wt 198 lb (89.812 kg)  BMI 30.54 kg/m2  SpO2 97%  Wt Readings from Last 3 Encounters:  06/10/15 198 lb (89.812 kg)  05/23/14 195 lb 8 oz (88.678 kg)  04/21/13 193 lb 12 oz (87.884 kg)    Physical Exam  Constitutional: He is oriented to person, place, and time. He appears well-developed and well-nourished. No distress.  HENT:  Head: Normocephalic and atraumatic.  Right Ear: Hearing, tympanic membrane, external ear and ear canal normal.  Left Ear: Hearing, tympanic  membrane, external ear and ear canal normal.  Nose: Nose normal.  Mouth/Throat: Uvula is midline, oropharynx is clear and moist and mucous membranes are normal. No oropharyngeal exudate, posterior oropharyngeal edema or posterior oropharyngeal erythema.  Eyes: Conjunctivae  and EOM are normal. Pupils are equal, round, and reactive to light. No scleral icterus.  Neck: Normal range of motion. Neck supple. Carotid bruit is present (?faint L carotid bruit). No thyromegaly present.  Cardiovascular: Normal rate, regular rhythm, normal heart sounds and intact distal pulses.   No murmur heard. Pulses:      Radial pulses are 2+ on the right side, and 2+ on the left side.  Pulmonary/Chest: Effort normal and breath sounds normal. No respiratory distress. He has no wheezes. He has no rales.  Abdominal: Soft. Bowel sounds are normal. He exhibits no distension and no mass. There is no tenderness. There is no rebound and no guarding.  Musculoskeletal: Normal range of motion. He exhibits no edema.  2+ DP on left No significant tenderness to palpation lateral left foot. Mild bony prominence lateral left foot.   Lymphadenopathy:    He has no cervical adenopathy.  Neurological: He is alert and oriented to person, place, and time.  CN grossly intact, station and gait intact Recall 3/3  Calculation 5/5 serial 7s  Skin: Skin is warm and dry. No rash noted.  Psychiatric: He has a normal mood and affect. His behavior is normal. Judgment and thought content normal.  Nursing note and vitals reviewed.  Results for orders placed or performed in visit on 05/27/15  PSA  Result Value Ref Range   PSA 1.11 0.10 - 4.00 ng/mL  Basic metabolic panel  Result Value Ref Range   Sodium 139 135 - 145 mEq/L   Potassium 4.6 3.5 - 5.1 mEq/L   Chloride 104 96 - 112 mEq/L   CO2 30 19 - 32 mEq/L   Glucose, Bld 115 (H) 70 - 99 mg/dL   BUN 14 6 - 23 mg/dL   Creatinine, Ser 1.02 0.40 - 1.50 mg/dL   Calcium 9.3 8.4 - 10.5 mg/dL   GFR 76.81 >60.00 mL/min  Lipid panel  Result Value Ref Range   Cholesterol 232 (H) 0 - 200 mg/dL   Triglycerides 140.0 0.0 - 149.0 mg/dL   HDL 52.90 >39.00 mg/dL   VLDL 28.0 0.0 - 40.0 mg/dL   LDL Cholesterol 152 (H) 0 - 99 mg/dL   Total CHOL/HDL Ratio 4    NonHDL  179.59       Assessment & Plan:   Problem List Items Addressed This Visit    Obesity, Class I, BMI 30-34.9    Discussed healthy diet and lifestyle changes to affect sustainable weight loss.      Medicare annual wellness visit, subsequent - Primary    I have personally reviewed the Medicare Annual Wellness questionnaire and have noted 1. The patient's medical and social history 2. Their use of alcohol, tobacco or illicit drugs 3. Their current medications and supplements 4. The patient's functional ability including ADL's, fall risks, home safety risks and hearing or visual impairment. Cognitive function has been assessed and addressed as indicated.  5. Diet and physical activity 6. Evidence for depression or mood disorders The patients weight, height, BMI have been recorded in the chart. I have made referrals, counseling and provided education to the patient based on review of the above and I have provided the pt with a written personalized care plan for preventive services. Provider list updated.Marland Kitchen See  scanned questionairre as needed for further documentation. Reviewed preventative protocols and updated unless pt declined.       Left foot pain    Pt states has been diagnosed by podiatrist with foot tendonitis and treated with custom insoles. Requests referral to [facility] for second opinion.      Relevant Orders   Ambulatory referral to Sports Medicine   Left carotid bruit   Relevant Orders   Carotid   Hyperglycemia    Discussed healthy diet changes to help control this.      HLD (hyperlipidemia)    Discussed. Given fmhx rec goal LDL <100. Continue fish oil. rec mediterranean diet. Discussed possibility of statin. Return in 6 mo for recheck FLP then decide on further chol treatment.      Health maintenance examination    Preventative protocols reviewed and updated unless pt declined. Discussed healthy diet and lifestyle.       BPH (benign prostatic hypertrophy)    Continue  f/u with Dr Junious Silk urologist.       Advanced care planning/counseling discussion    Advanced directives: packet provided again today. Would want wife to be HCPOA.        Other Visit Diagnoses    Special screening for malignant neoplasms, colon        Relevant Orders    Fecal occult blood, imunochemical        Follow up plan: Return in about 1 year (around 06/09/2016), or as needed, for medicare wellness visit.

## 2015-06-10 NOTE — Assessment & Plan Note (Addendum)
Discussed. Given fmhx rec goal LDL <100. Continue fish oil. rec mediterranean diet. Discussed possibility of statin. Return in 6 mo for recheck FLP then decide on further chol treatment.

## 2015-06-10 NOTE — Assessment & Plan Note (Signed)
Discussed healthy diet and lifestyle changes to affect sustainable weight loss  

## 2015-06-10 NOTE — Addendum Note (Signed)
Addended by: Lurlean Nanny on: 06/10/2015 12:55 PM   Modules accepted: Orders

## 2015-06-10 NOTE — Assessment & Plan Note (Signed)
Preventative protocols reviewed and updated unless pt declined. Discussed healthy diet and lifestyle.  

## 2015-06-10 NOTE — Assessment & Plan Note (Signed)
Advanced directives: packet provided again today. Would want wife to be HCPOA.

## 2015-06-10 NOTE — Assessment & Plan Note (Signed)

## 2015-06-10 NOTE — Progress Notes (Signed)
Pre visit review using our clinic review tool, if applicable. No additional management support is needed unless otherwise documented below in the visit note. 

## 2015-06-13 ENCOUNTER — Ambulatory Visit (HOSPITAL_COMMUNITY)
Admission: RE | Admit: 2015-06-13 | Discharge: 2015-06-13 | Disposition: A | Discharge status: Home / Self Care | Payer: Medicare Other | Source: Ambulatory Visit | Attending: Family Medicine | Admitting: Family Medicine

## 2015-06-13 DIAGNOSIS — I6502 Occlusion and stenosis of left vertebral artery: Secondary | ICD-10-CM | POA: Insufficient documentation

## 2015-06-13 DIAGNOSIS — I6523 Occlusion and stenosis of bilateral carotid arteries: Secondary | ICD-10-CM | POA: Insufficient documentation

## 2015-06-13 DIAGNOSIS — R0989 Other specified symptoms and signs involving the circulatory and respiratory systems: Secondary | ICD-10-CM | POA: Diagnosis not present

## 2015-06-13 HISTORY — DX: Occlusion and stenosis of right vertebral artery: I65.01

## 2015-06-13 HISTORY — DX: Occlusion and stenosis of unspecified carotid artery: I65.29

## 2015-06-16 ENCOUNTER — Encounter (HOSPITAL_COMMUNITY): Payer: Self-pay

## 2015-06-17 ENCOUNTER — Ambulatory Visit (INDEPENDENT_AMBULATORY_CARE_PROVIDER_SITE_OTHER): Payer: Medicare Other | Admitting: Family Medicine

## 2015-06-17 ENCOUNTER — Encounter: Payer: Self-pay | Admitting: Family Medicine

## 2015-06-17 ENCOUNTER — Encounter: Payer: Self-pay | Admitting: *Deleted

## 2015-06-17 VITALS — BP 137/80 | HR 63 | Temp 97.5°F | Ht 67.5 in | Wt 199.0 lb

## 2015-06-17 DIAGNOSIS — M7672 Peroneal tendinitis, left leg: Secondary | ICD-10-CM

## 2015-06-17 DIAGNOSIS — M79672 Pain in left foot: Secondary | ICD-10-CM

## 2015-06-17 DIAGNOSIS — M6788 Other specified disorders of synovium and tendon, other site: Secondary | ICD-10-CM

## 2015-06-17 NOTE — Progress Notes (Signed)
Dr. Frederico Hamman T. Abbagayle Zaragoza, MD, [facility] [place], [address] Phone: [phone] Fax: [phone]  06/17/2015  Patient: Larry Kent, MRN: [id number], DOB: 1945/09/22, 69 y.o.  Primary Physician:  Ria Bush, MD   Chief Complaint  Patient presents with  . Foot Pain    Left   Subjective:   Larry Kent is a 69 y.o. very pleasant male patient who presents with the following:  Peroneal tendinopathy on the left: L foot pain: lateral tendon will get inflammed and hurt, and it will hurt more the more that he is on it. No known injury. This has been ongoing for many months. He has not had any specific trauma that he can recall, but this is been bothering him for many months.  It is always on the lateral aspect.  Has improved somewhat since he got some custom orthotics, but he still has quite a bit of pain when he is up on his foot a lot.  He has not been doing any specific peroneal tendinopathy rehabilitation.  Podiatrist - saw him wanted to make sure that there was no bone injury. Made some custom insoles.   Past Medical History, Surgical History, Social History, Family History, Problem List, Medications, and Allergies have been reviewed and updated if relevant.  Patient Active Problem List   Diagnosis Date Noted  . Left foot pain 06/10/2015  . Obesity, Class I, BMI 30-34.9 06/10/2015  . Carotid stenosis 05/30/2015  . Occlusion of right vertebral artery 05/30/2015  . Advanced care planning/counseling discussion 05/23/2014  . Hyperglycemia 05/23/2014  . Arthralgia 05/23/2014  . Health maintenance examination 05/23/2014  . Medicare annual wellness visit, subsequent 02/12/2012  . HLD (hyperlipidemia) 02/12/2012  . BPH (benign prostatic hypertrophy)   . FHx: prostate cancer   . FHx: colon cancer     Past Medical History  Diagnosis Date  . BPH (benign prostatic hypertrophy)     Dr. Junious Silk @ [facility]  .  FHx: prostate cancer   . FHx: colon cancer   . BCC (basal cell carcinoma), face 2014    L preauricular s/p MOHS  . Nephrolithiasis 09/2102  . Carotid stenosis 05/2015    RICA 96-75%, LICA 9-16%, L vertebral occlusion, rpt 1 yr  . Occlusion of right vertebral artery 05/2015    Past Surgical History  Procedure Laterality Date  . Hernia repair  2008    Right  . Testicular biopsy  2003    benign, varicocele  . Cataract extraction  2008    Left  . Cataract extraction  2013    Right ([facility])  . Exercise treadmill  11/2005    WNL [facility])  . Colonoscopy  ~2010    medium int hemorrhoids, o/w WNL, rpt 5 yrs given fmhx (Dr. Oletta Lamas)  . US echocardiography  11/2005    aortic sclerosis, EF 38-46%, diastolic dysfunction  . Mohs surgery Left spring 2014    basal cell face    Social History   Social History  . Marital Status: Married    Spouse Name: N/A  . Number of Children: N/A  . Years of Education: N/A   Occupational History  . Not on file.   Social History Main Topics  . Smoking status: Never Smoker   . Smokeless tobacco: Never Used  . Alcohol Use: 0.0 oz/week    0 Standard drinks or equivalent per week     Comment: rare  . Drug Use: No  .  Sexual Activity: Not on file   Other Topics Concern  . Not on file   Social History Narrative   Caffeine: 2-3 cups coffee/day   Lives with wife and adult son, 1 dog   Occupation: Counsellor   Edu: college   Activity: golf, no regular exercise   Diet: good amt water, daily fruits/vegetables    Family History  Problem Relation Age of Onset  . Cancer Father 84    prostate  . Cancer Paternal Grandfather 30    prostate  . Coronary artery disease Father 35    MI  . Stroke Mother   . Coronary artery disease Maternal Uncle   . Hypertension Mother   . Diabetes Neg Hx   . Cancer Mother 63    colon  . Cancer Sister 39    breast    No Known Allergies  Medication list reviewed and updated in full in [place].  GEN: No fevers, chills. Nontoxic. Primarily MSK c/o today. MSK: Detailed in the HPI GI: tolerating PO intake without difficulty Neuro: No numbness, parasthesias, or tingling associated. Otherwise the pertinent positives of the ROS are noted above.   Objective:   BP 137/80 mmHg  Pulse 63  Temp(Src) 97.5 F (36.4 C) (Oral)  Ht 5' 7.5" (1.715 m)  Wt 199 lb (90.266 kg)  BMI 30.69 kg/m2   GEN: WDWN, NAD, Non-toxic, Alert & Oriented x 3 HEENT: Atraumatic, Normocephalic.  Ears and Nose: No external deformity. EXTR: No clubbing/cyanosis/edema NEURO: Normal gait.  PSYCH: Normally interactive. Conversant. Not depressed or anxious appearing.  Calm demeanor.   FEET: L Echymosis: no Edema: no ROM: full LE B Gait: heel toe, non-antalgic MT pain: no Callus pattern: none Lateral Mall: NT Medial Mall: NT Talus: NT Navicular: NT Cuboid: NT Calcaneous: NT Metatarsals: NT 5th MT: NT Phalanges: NT Achilles: NT Plantar Fascia: NT Fat Pad: NT Peroneals: TTP L at insertion and pain with plantarflexion Post Tib: NT Great Toe: Nml motion Ant Drawer: neg ATFL: NT CFL: NT Deltoid: NT Other foot breakdown: none Long arch: preserved - cavus foot Transverse arch: preserved Hindfoot breakdown: none Sensation: intact   Radiology:  Assessment and Plan:   Peroneal tendinosis, left  Left foot pain  >25 minutes spent in face to face time with patient, >50% spent in counselling or coordination of care  Chronic tendinopathy. I like his orthotics - would add some rehab of this region, which normally helps.   I reviewed with the patient the structures involved and how they related to their diagnosis. The patient indicated that they understood our discussion and the anatomy involved.   Ice massage 2-3 times a day Rehab as described in p/i, toe raises, pidgeon toed and walking pidgeon toed  The patient would benefit from an ASO ankle brace type to unload the PT tendon - he can  use on job sites and if he needs to go to [employer] or [employer] Given one.  Patient Instructions  Peroneal Tendonitis Rehab:  Begin with easy walking, heel, toe and backwards * Try to pick an easy location like a hallway or a room in your house and do one of these each time that you go through this area.  Towel "Scrunch Ups" Use a hand towel or a moderate size towel Foot flat down on the towel Use toes to "scrunch up the towel" straight up and down, and going to the right and left.  3 sets of 20 * Can be done  watching TV, reading, or sitting and relaxing.   Balance Exercises:  While brushing teeth, practice standing on 1 foot. Do for as long as you can, ideally 30 seconds to 1 minute each foot.     DO NOT DO THESE UNTIL YOU ARE MOSTLY BETTER Calf raises on a step - Pidgeon Toes, with toes turned inward Try to do most days of the week If pain persists at 3 sets of 30 - add backpack with 5 lbs Increase by 5 lbs per week to max of 30 lbs      Follow-up: No Follow-up on file.  Signed,  Maud Deed. Sobia Karger, MD   Patient's Medications  New Prescriptions   No medications on file  Previous Medications   ASPIRIN 81 MG TABLET    Take 81 mg by mouth daily.   MULTIPLE VITAMIN (MULTIVITAMIN) TABLET    Take 1 tablet by mouth daily.   OMEGA-3 FATTY ACIDS (FISH OIL) 1000 MG CAPS    Take 1,000 mg by mouth daily.  Modified Medications   No medications on file  Discontinued Medications   No medications on file

## 2015-06-17 NOTE — Progress Notes (Signed)
Pre visit review using our clinic review tool, if applicable. No additional management support is needed unless otherwise documented below in the visit note. 

## 2015-06-17 NOTE — Patient Instructions (Signed)
Peroneal Tendonitis Rehab:  Begin with easy walking, heel, toe and backwards * Try to pick an easy location like a hallway or a room in your house and do one of these each time that you go through this area.  Towel "Scrunch Ups" Use a hand towel or a moderate size towel Foot flat down on the towel Use toes to "scrunch up the towel" straight up and down, and going to the right and left.  3 sets of 20 * Can be done watching TV, reading, or sitting and relaxing.   Balance Exercises:  While brushing teeth, practice standing on 1 foot. Do for as long as you can, ideally 30 seconds to 1 minute each foot.     DO NOT DO THESE UNTIL YOU ARE MOSTLY BETTER Calf raises on a step - Pidgeon Toes, with toes turned inward Try to do most days of the week If pain persists at 3 sets of 30 - add backpack with 5 lbs Increase by 5 lbs per week to max of 30 lbs

## 2015-06-26 ENCOUNTER — Encounter (INDEPENDENT_AMBULATORY_CARE_PROVIDER_SITE_OTHER): Payer: Self-pay

## 2015-07-25 ENCOUNTER — Other Ambulatory Visit: Payer: Medicare Other

## 2015-07-25 DIAGNOSIS — Z1211 Encounter for screening for malignant neoplasm of colon: Secondary | ICD-10-CM

## 2015-07-25 LAB — FECAL OCCULT BLOOD, IMMUNOCHEMICAL: Fecal Occult Bld: NEGATIVE

## 2015-07-25 LAB — FECAL OCCULT BLOOD, GUAIAC: FECAL OCCULT BLD: NEGATIVE

## 2015-07-26 ENCOUNTER — Encounter: Payer: Self-pay | Admitting: *Deleted

## 2015-08-02 ENCOUNTER — Telehealth: Payer: Self-pay | Admitting: Family Medicine

## 2015-08-02 NOTE — Telephone Encounter (Signed)
Spoke with patient and scheduled follow up

## 2015-08-02 NOTE — Telephone Encounter (Signed)
Patient received a letter about the results of his carotid US.  Patient has some questions about the results. Please call patient.

## 2015-08-06 ENCOUNTER — Ambulatory Visit: Payer: Medicare Other | Admitting: Family Medicine

## 2015-08-12 ENCOUNTER — Ambulatory Visit (INDEPENDENT_AMBULATORY_CARE_PROVIDER_SITE_OTHER): Payer: Medicare Other | Admitting: Family Medicine

## 2015-08-12 ENCOUNTER — Encounter: Payer: Self-pay | Admitting: Family Medicine

## 2015-08-12 VITALS — BP 140/84 | HR 84 | Temp 98.1°F | Wt 202.5 lb

## 2015-08-12 DIAGNOSIS — W540XXA Bitten by dog, initial encounter: Secondary | ICD-10-CM | POA: Diagnosis not present

## 2015-08-12 DIAGNOSIS — S61452A Open bite of left hand, initial encounter: Secondary | ICD-10-CM | POA: Diagnosis not present

## 2015-08-12 DIAGNOSIS — I6523 Occlusion and stenosis of bilateral carotid arteries: Secondary | ICD-10-CM

## 2015-08-12 DIAGNOSIS — I6502 Occlusion and stenosis of left vertebral artery: Secondary | ICD-10-CM | POA: Diagnosis not present

## 2015-08-12 DIAGNOSIS — E785 Hyperlipidemia, unspecified: Secondary | ICD-10-CM | POA: Diagnosis not present

## 2015-08-12 DIAGNOSIS — R208 Other disturbances of skin sensation: Secondary | ICD-10-CM

## 2015-08-12 DIAGNOSIS — R200 Anesthesia of skin: Secondary | ICD-10-CM

## 2015-08-12 MED ORDER — CEFTRIAXONE SODIUM 1 G IJ SOLR
1.0000 g | Freq: Once | INTRAMUSCULAR | Status: AC
  Administered 2015-08-12: 1 g via INTRAMUSCULAR

## 2015-08-12 MED ORDER — ATORVASTATIN CALCIUM 40 MG PO TABS: 40.0000 mg | ORAL_TABLET | Freq: Every day | ORAL | Status: DC

## 2015-08-12 MED ORDER — AMOXICILLIN-POT CLAVULANATE 875-125 MG PO TABS: 1.0000 | ORAL_TABLET | Freq: Two times a day (BID) | ORAL | Status: AC

## 2015-08-12 NOTE — Addendum Note (Signed)
Addended by: Royann Shivers A on: 08/12/2015 11:03 AM   Modules accepted: Orders

## 2015-08-12 NOTE — Assessment & Plan Note (Signed)
Reviewed with patient. rec rpt [place] 1 yr.

## 2015-08-12 NOTE — Patient Instructions (Addendum)
Stop red yeast rice. Start lipitor (atorvastatin) '40mg'$  daily. For dog bite - you're up to date on tetanus shot. Shot of ceftriaxone today then start augmentin antibiotic twice daily for 10 days. Return on Wed or Thurs for recheck hand. Elevate hand.  Animal Bite Animal bites can range from mild to serious. An animal bite can result in a scratch on the skin, a deep open cut, a puncture of the skin, a crush injury, or tearing away of the skin or a body part. A small bite from a house pet will usually not cause serious problems. However, some animal bites can become infected or injure a bone or other tissue.  Bites from certain animals can be more dangerous because of the risk of spreading rabies, which is a serious viral infection. This risk is higher with bites from stray animals or wild animals, such as raccoons, foxes, skunks, and bats. Dogs are responsible for most animal bites. Children are bitten more often than adults. SYMPTOMS  Common symptoms of an animal bite include:   Pain.   Bleeding.   Swelling.   Bruising.  DIAGNOSIS  This condition may be diagnosed based on a physical exam and medical history. Your health care provider will examine the wound and ask for details about the animal and how the bite happened. You may also have tests, such as:   Blood tests to check for infection or to determine if surgery is needed.  X-rays to check for damage to bones or joints.  Culture test. This uses a sample of fluid from the wound to check for infection. TREATMENT  Treatment varies depending on the location and type of animal bite and your medical history. Treatment may include:   Wound care. This often includes cleaning the wound, flushing the wound with saline solution, and applying a bandage (dressing). Sometimes, the wound is left open to heal because of the high risk of infection. However, in some cases, the wound may be closed with stitches (sutures), staples, skin glue, or  adhesive strips.   Antibiotic medicine.   Tetanus shot.   Rabies treatment if the animal could have rabies.  In some cases, bites that have become infected may require IV antibiotics and surgical treatment in the hospital.  [place]  Follow instructions from your health care provider about how to take care of your wound. Make sure you:  Wash your hands with soap and water before you change your dressing. If soap and water are not available, use hand sanitizer.  Change your dressing as told by your health care provider.  Leave sutures, skin glue, or adhesive strips in place. These skin closures may need to be in place for 2 weeks or longer. If adhesive strip edges start to loosen and curl up, you may trim the loose edges. Do not remove adhesive strips completely unless your health care provider tells you to do that.  Check your wound every day for signs of infection. Watch for:   Increasing redness, swelling, or pain.   Fluid, blood, or pus.  General Instructions  Take or apply over-the-counter and prescription medicines only as told by your health care provider.   If you were prescribed an antibiotic, take or apply it as told by your health care provider. Do not stop using the antibiotic even if your condition improves.   Keep the injured area raised (elevated) above the level of your heart while you are sitting or lying down, if this is possible.  If directed, apply ice to the injured area.   Put ice in a plastic bag.   Place a towel between your skin and the bag.   Leave the ice on for 20 minutes, 2-3 times per day.   Keep all follow-up visits as told by your health care provider. This is important.  SEEK MEDICAL CARE IF:  You have increasing redness, swelling, or pain at the site of your wound.   You have a general feeling of sickness (malaise).   You feel nauseous or you vomit.   You have pain that does not get  better.  SEEK IMMEDIATE MEDICAL CARE IF:  You have a red streak extending away from your wound.   You have fluid, blood, or pus coming from your wound.   You have a fever or chills.   You have trouble moving your injured area.   You have numbness or tingling extending beyond the wound.   This information is not intended to replace advice given to you by your health care provider. Make sure you discuss any questions you have with your health care provider.   Document Released: 03/03/2011 Document Revised: 03/06/2015 Document Reviewed: 10/31/2014 Elsevier Interactive Patient Education [employer].

## 2015-08-12 NOTE — Assessment & Plan Note (Signed)
Occurred 2 d ago, marked dorsal swelling without significant erythema or warmth. Will cover with 1gm CTX today and then 10d oral augmentin course. Red flags to seek care discussed. Given high risk location of bite, RTC 2-3d f/u visit.  Pt agrees with plan.

## 2015-08-12 NOTE — Progress Notes (Signed)
BP 140/84 mmHg  Pulse 84  Temp(Src) 98.1 F (36.7 C) (Oral)  Wt 202 lb 8 oz (91.853 kg)   CC: f/u visit, wound check  Subjective:    Patient ID: Larry Kent, male    DOB: Jun 08, 1946, 70 y.o.   MRN: [id number]  HPI: Larry Kent is a 70 y.o. male presenting on 08/12/2015 for Follow-up and Wound Check   Recent carotid US showing 41-66% RICA and 0-63% LICA stenosis. Endorses infrequent dizziness, blurry vision with standing, and some intermittent numbness to right face.   Strong fmhx CAD goal LDL <100. rec mediterranean diet.   Also recent dog bite to L hand while cuting his hair - several puncture wounds to L dorsal hand Collene Gobble). Dog is inside dog, UTD on shots. Pt denies chills, nausea, streaking redness. Initially feverish. Dressing changes with neosporin. Has iced hand, taking tylenol for discomfort. Currently taking PCN QID for dental infection (over last 10d).   Td UTD 2013.   Relevant past medical, surgical, family and social history reviewed and updated as indicated. Interim medical history since our last visit reviewed. Allergies and medications reviewed and updated. Current Outpatient Prescriptions on File Prior to Visit  Medication Sig  . aspirin 81 MG tablet Take 81 mg by mouth daily.  . Multiple Vitamin (MULTIVITAMIN) tablet Take 1 tablet by mouth daily.  . Omega-3 Fatty Acids (FISH OIL) 1000 MG CAPS Take 1,000 mg by mouth daily.   No current facility-administered medications on file prior to visit.    Review of Systems Per HPI unless specifically indicated in ROS section     Objective:    BP 140/84 mmHg  Pulse 84  Temp(Src) 98.1 F (36.7 C) (Oral)  Wt 202 lb 8 oz (91.853 kg)  Wt Readings from Last 3 Encounters:  08/12/15 202 lb 8 oz (91.853 kg)  06/17/15 199 lb (90.266 kg)  06/10/15 198 lb (89.812 kg)    Physical Exam  Constitutional: He appears well-developed and well-nourished. No distress.  Musculoskeletal: He exhibits edema (L dorsal  hand).  L dorsal hand edema with 3 small lacerations without streaking redness FROM at wrist without pain to palpation at wrist or at MCP/IP joints Smaller dry abrasions R digits  Skin: Skin is warm and dry. No rash noted. There is erythema (minimal L dorsal hand).  Psychiatric: He has a normal mood and affect.  Nursing note and vitals reviewed.     Assessment & Plan:   Problem List Items Addressed This Visit    Occlusion of left vertebral artery    Reviewed with patient. Given facial numbness - check baseline head CT r/o prior stroke.      Relevant Medications   atorvastatin (LIPITOR) 40 MG tablet   Other Relevant Orders   CT Head Wo Contrast   HLD (hyperlipidemia)    Reviewed recent cardiovascular disease diagnosis. Start atorvastatin '40mg'$  daily. Pt agrees.      Relevant Medications   atorvastatin (LIPITOR) 40 MG tablet   Other Relevant Orders   CT Head Wo Contrast   Dog bite of left hand - Primary    Occurred 2 d ago, marked dorsal swelling without significant erythema or warmth. Will cover with 1gm CTX today and then 10d oral augmentin course. Red flags to seek care discussed. Given high risk location of bite, RTC 2-3d f/u visit.  Pt agrees with plan.       Carotid stenosis    Reviewed with patient. rec rpt [place] 1 yr.  Relevant Medications   atorvastatin (LIPITOR) 40 MG tablet   Other Relevant Orders   CT Head Wo Contrast    Other Visit Diagnoses    Right facial numbness        Relevant Orders    CT Head Wo Contrast        Follow up plan: Return in about 3 days (around 08/15/2015), or if symptoms worsen or fail to improve, for follow up visit.

## 2015-08-12 NOTE — Progress Notes (Signed)
Pre visit review using our clinic review tool, if applicable. No additional management support is needed unless otherwise documented below in the visit note. 

## 2015-08-12 NOTE — Assessment & Plan Note (Signed)
Reviewed recent cardiovascular disease diagnosis. Start atorvastatin '40mg'$  daily. Pt agrees.

## 2015-08-12 NOTE — Assessment & Plan Note (Signed)
Reviewed with patient. Given facial numbness - check baseline head CT r/o prior stroke.

## 2015-08-13 ENCOUNTER — Ambulatory Visit (INDEPENDENT_AMBULATORY_CARE_PROVIDER_SITE_OTHER)
Admission: RE | Admit: 2015-08-13 | Discharge: 2015-08-13 | Disposition: A | Discharge status: Home / Self Care | Payer: Medicare Other | Source: Ambulatory Visit | Attending: Family Medicine | Admitting: Family Medicine

## 2015-08-13 DIAGNOSIS — R200 Anesthesia of skin: Secondary | ICD-10-CM

## 2015-08-13 DIAGNOSIS — E785 Hyperlipidemia, unspecified: Secondary | ICD-10-CM

## 2015-08-13 DIAGNOSIS — R208 Other disturbances of skin sensation: Secondary | ICD-10-CM | POA: Diagnosis not present

## 2015-08-13 DIAGNOSIS — I6523 Occlusion and stenosis of bilateral carotid arteries: Secondary | ICD-10-CM | POA: Diagnosis not present

## 2015-08-13 DIAGNOSIS — I6502 Occlusion and stenosis of left vertebral artery: Secondary | ICD-10-CM

## 2015-08-15 ENCOUNTER — Encounter: Payer: Self-pay | Admitting: Family Medicine

## 2015-08-15 ENCOUNTER — Ambulatory Visit (INDEPENDENT_AMBULATORY_CARE_PROVIDER_SITE_OTHER): Payer: Medicare Other | Admitting: Family Medicine

## 2015-08-15 VITALS — BP 124/80 | HR 60 | Temp 97.6°F | Wt 200.0 lb

## 2015-08-15 DIAGNOSIS — W540XXA Bitten by dog, initial encounter: Secondary | ICD-10-CM

## 2015-08-15 DIAGNOSIS — S61452A Open bite of left hand, initial encounter: Secondary | ICD-10-CM

## 2015-08-15 DIAGNOSIS — W540XXD Bitten by dog, subsequent encounter: Principal | ICD-10-CM

## 2015-08-15 DIAGNOSIS — S61452D Open bite of left hand, subsequent encounter: Secondary | ICD-10-CM

## 2015-08-15 NOTE — Patient Instructions (Signed)
Return Monday afternoon to recheck swelling. If markedly better over weekend, may cancel appointment.  Watch for worsening pain or streaking redness or fever - if this happens seek urgent over weekend.  Finish augmentin antibiotic.  Take aleve 1-2 pills daily until next week.

## 2015-08-15 NOTE — Progress Notes (Signed)
Pre visit review using our clinic review tool, if applicable. No additional management support is needed unless otherwise documented below in the visit note. 

## 2015-08-15 NOTE — Progress Notes (Signed)
   BP 124/80 mmHg  Pulse 60  Temp(Src) 97.6 F (36.4 C) (Oral)  Wt 200 lb (90.719 kg)  SpO2 96%   CC: recheck dog bite Subjective:    Patient ID: Larry Kent, male    DOB: [birth year], 70 y.o.   MRN: [id number]  HPI: Larry Kent is a 70 y.o. male presenting on 08/15/2015 for Follow-up   See prior note for details. Seen here 08/12/2015 after [place] dog bite to L dorsal hand. Treated with 1gm IM CTX + augmentin course. Here for f/u.   Feels slightly better.  Persistent dorsal swelling without erythema. No fevers/chills, nausea. Not needing pain medication. Elevating hand. No more drainage from wound.  Relevant past medical, surgical, family and social history reviewed and updated as indicated. Interim medical history since our last visit reviewed. Allergies and medications reviewed and updated. Current Outpatient Prescriptions on File Prior to Visit  Medication Sig  . amoxicillin-clavulanate (AUGMENTIN) 875-125 MG tablet Take 1 tablet by mouth 2 (two) times daily.  Marland Kitchen aspirin 81 MG tablet Take 81 mg by mouth daily.  Marland Kitchen atorvastatin (LIPITOR) 40 MG tablet Take 1 tablet (40 mg total) by mouth daily.  . Multiple Vitamin (MULTIVITAMIN) tablet Take 1 tablet by mouth daily.  . Omega-3 Fatty Acids (FISH OIL) 1000 MG CAPS Take 1,000 mg by mouth daily.   No current facility-administered medications on file prior to visit.    Review of Systems Per HPI unless specifically indicated in ROS section     Objective:    BP 124/80 mmHg  Pulse 60  Temp(Src) 97.6 F (36.4 C) (Oral)  Wt 200 lb (90.719 kg)  SpO2 96%  Wt Readings from Last 3 Encounters:  08/15/15 200 lb (90.719 kg)  08/12/15 202 lb 8 oz (91.853 kg)  06/17/15 199 lb (90.266 kg)    Physical Exam  Constitutional: He appears well-developed and well-nourished. No distress.  Musculoskeletal: He exhibits edema (L dorsal hand).  L dorsal hand edema with 3 small lacerations without streaking redness or drainage FROM at  wrist without pain to palpation at wrist or at MCP/IP joints  Skin: Skin is warm and dry. No rash noted. No erythema.  Psychiatric: He has a normal mood and affect.  Nursing note and vitals reviewed.      Assessment & Plan:   Problem List Items Addressed This Visit    Dog bite of left hand - Primary    Slowed improvement noted. Continue augmentin course.  Discussed red flags to seek urgent care.  RTC Monday afternoon if not improving as expected. Pt agrees.          Follow up plan: Return in about 4 days (around 08/19/2015), or if symptoms worsen or fail to improve, for follow up visit.

## 2015-08-15 NOTE — Assessment & Plan Note (Signed)
Slowed improvement noted. Continue augmentin course.  Discussed red flags to seek urgent care.  RTC Monday afternoon if not improving as expected. Pt agrees.

## 2015-08-19 ENCOUNTER — Ambulatory Visit (INDEPENDENT_AMBULATORY_CARE_PROVIDER_SITE_OTHER): Payer: Medicare Other | Admitting: Family Medicine

## 2015-08-19 ENCOUNTER — Encounter: Payer: Self-pay | Admitting: Family Medicine

## 2015-08-19 VITALS — BP 122/76 | HR 56 | Temp 97.7°F | Wt 201.0 lb

## 2015-08-19 DIAGNOSIS — S61452A Open bite of left hand, initial encounter: Secondary | ICD-10-CM | POA: Diagnosis not present

## 2015-08-19 DIAGNOSIS — W540XXD Bitten by dog, subsequent encounter: Principal | ICD-10-CM

## 2015-08-19 DIAGNOSIS — S61452D Open bite of left hand, subsequent encounter: Secondary | ICD-10-CM

## 2015-08-19 DIAGNOSIS — W540XXA Bitten by dog, initial encounter: Secondary | ICD-10-CM | POA: Diagnosis not present

## 2015-08-19 NOTE — Patient Instructions (Signed)
Keep wound covered with neosporin until healed. Ok to continue showering then pat dry.

## 2015-08-19 NOTE — Progress Notes (Signed)
Pre visit review using our clinic review tool, if applicable. No additional management support is needed unless otherwise documented below in the visit note. 

## 2015-08-19 NOTE — Assessment & Plan Note (Signed)
Continues slowly healing. Reviewed red flags to seek care. Last day of abx is today. F/u PRN.

## 2015-08-19 NOTE — Progress Notes (Signed)
   BP 122/76 mmHg  Pulse 56  Temp(Src) 97.7 F (36.5 C) (Oral)  Wt 201 lb (91.173 kg)  SpO2 97%   CC: f/u visit  Subjective:    Patient ID: Larry Kent, male    DOB: [birth year], 70 y.o.   MRN: [id number]  HPI: Larry Kent is a 70 y.o. male presenting on 08/19/2015 for Follow-up   See prior note for details.  Continues healing slowly, swelling decreasing.  Relevant past medical, surgical, family and social history reviewed and updated as indicated. Interim medical history since our last visit reviewed. Allergies and medications reviewed and updated. Current Outpatient Prescriptions on File Prior to Visit  Medication Sig  . amoxicillin-clavulanate (AUGMENTIN) 875-125 MG tablet Take 1 tablet by mouth 2 (two) times daily.  Marland Kitchen aspirin 81 MG tablet Take 81 mg by mouth daily.  Marland Kitchen atorvastatin (LIPITOR) 40 MG tablet Take 1 tablet (40 mg total) by mouth daily.  . Multiple Vitamin (MULTIVITAMIN) tablet Take 1 tablet by mouth daily.  . Omega-3 Fatty Acids (FISH OIL) 1000 MG CAPS Take 1,000 mg by mouth daily.   No current facility-administered medications on file prior to visit.    Review of Systems Per HPI unless specifically indicated in ROS section     Objective:    BP 122/76 mmHg  Pulse 56  Temp(Src) 97.7 F (36.5 C) (Oral)  Wt 201 lb (91.173 kg)  SpO2 97%  Wt Readings from Last 3 Encounters:  08/19/15 201 lb (91.173 kg)  08/15/15 200 lb (90.719 kg)  08/12/15 202 lb 8 oz (91.853 kg)    Physical Exam  Constitutional: He appears well-developed and well-nourished. No distress.  Musculoskeletal: He exhibits edema (L dorsal hand).  L dorsal hand swelling remains present but improved from last week.  No streaking or drainage. FROM at wrist without pain to palpation at wrist or at MCP/IP joints  Skin: Skin is warm and dry. No rash noted. No erythema.  Psychiatric: He has a normal mood and affect.  Nursing note and vitals reviewed.  Results for orders placed or  performed in visit on 07/26/15  Fecal Occult Blood, Guaiac  Result Value Ref Range   Fecal Occult Blood Negative       Assessment & Plan:   Problem List Items Addressed This Visit    Dog bite of left hand - Primary    Continues slowly healing. Reviewed red flags to seek care. Last day of abx is today. F/u PRN.          Follow up plan: No Follow-up on file.

## 2015-10-15 ENCOUNTER — Telehealth: Payer: Self-pay

## 2015-10-15 NOTE — Telephone Encounter (Signed)
Pt has been taking atorvastatin and for the last month pt is feeling tired and muscle aches all over body; by 5 pm pt is ready to go to sleep. Pt thinks atorvastatin causing these feelings. Pt wants to know if can try different statin or could atorvastatin dosage be too high.[facility].pt request cb.

## 2015-10-15 NOTE — Telephone Encounter (Signed)
Lets try taking atorvastatin every other day to see if any improvement. If no better, update [place] after 1-2 wks to try lower potency statin.

## 2015-10-16 NOTE — Telephone Encounter (Signed)
Patient notified and verbalized understanding. 

## 2015-12-12 ENCOUNTER — Other Ambulatory Visit (INDEPENDENT_AMBULATORY_CARE_PROVIDER_SITE_OTHER): Payer: Medicare Other

## 2015-12-12 DIAGNOSIS — E785 Hyperlipidemia, unspecified: Secondary | ICD-10-CM | POA: Diagnosis not present

## 2015-12-12 LAB — LIPID PANEL
CHOL/HDL RATIO: 3
CHOLESTEROL: 147 mg/dL (ref 0–200)
HDL: 51.8 mg/dL (ref >39.00)
LDL Cholesterol: 77 mg/dL (ref 0–99)
NonHDL: 94.96
TRIGLYCERIDES: 88 mg/dL (ref 0.0–149.0)
VLDL: 17.6 mg/dL (ref 0.0–40.0)

## 2015-12-16 ENCOUNTER — Encounter: Payer: Self-pay | Admitting: *Deleted

## 2016-03-05 ENCOUNTER — Ambulatory Visit: Payer: Medicare Other | Admitting: Family Medicine

## 2016-03-25 ENCOUNTER — Encounter: Payer: Self-pay | Admitting: Family Medicine

## 2016-03-25 ENCOUNTER — Ambulatory Visit (INDEPENDENT_AMBULATORY_CARE_PROVIDER_SITE_OTHER): Payer: Medicare Other | Admitting: Family Medicine

## 2016-03-25 VITALS — BP 120/78 | HR 63 | Temp 98.5°F | Ht 67.5 in | Wt 189.5 lb

## 2016-03-25 DIAGNOSIS — M25561 Pain in right knee: Secondary | ICD-10-CM | POA: Diagnosis not present

## 2016-03-25 NOTE — Progress Notes (Signed)
Pre visit review using our clinic review tool, if applicable. No additional management support is needed unless otherwise documented below in the visit note. 

## 2016-03-25 NOTE — Progress Notes (Signed)
Dr. Frederico Hamman T. Bellina Tokarczyk, MD, [facility] [place], [address] Phone: [phone] Fax: [phone]  03/25/2016  Patient: Larry Kent, MRN: [id number], DOB: Mar 27, 1946, 70 y.o.  Primary Physician:  Ria Bush, MD   Chief Complaint  Patient presents with  . Knee Pain    Right-Radiates down to ankle   Subjective:   Larry Kent is a 70 y.o. very pleasant male patient who presents with the following:  Off and on for the last 6 months - bothering him towards the end of the day. Working up and down a ladder 4 or 5 days in a row. Back of knee and radiates down in to the ankle / leg. Alternating ice and heat.   R knee Generally all of his symptoms got quite a bit worse and last for 5 days when he was on a ladder, and he also took a trip to the beach. This is all been posterior. It does radiate somewhat down the lateral aspect of his leg, but much of's in the posterior lateral corner region of his knee.  Gradually building over the last couple of weeks.     Past Medical History, Surgical History, Social History, Family History, Problem List, Medications, and Allergies have been reviewed and updated if relevant.  Patient Active Problem List   Diagnosis Date Noted  . Dog bite of left hand 08/12/2015  . Left foot pain 06/10/2015  . Obesity, Class I, BMI 30-34.9 06/10/2015  . Carotid stenosis 05/30/2015  . Occlusion of left vertebral artery 05/30/2015  . Advanced care planning/counseling discussion 05/23/2014  . Hyperglycemia 05/23/2014  . Arthralgia 05/23/2014  . Health maintenance examination 05/23/2014  . Medicare annual wellness visit, subsequent 02/12/2012  . HLD (hyperlipidemia) 02/12/2012  . BPH (benign prostatic hypertrophy)   . FHx: prostate cancer   . FHx: colon cancer     Past Medical History:  Diagnosis Date  . BCC (basal cell carcinoma), face 2014   L preauricular s/p MOHS  . BPH (benign  prostatic hypertrophy)    Dr. Junious Silk @ [facility]  . Carotid stenosis 86/7672   RICA 09-47%, LICA 0-96%, L vertebral occlusion, rpt 1 yr  . FHx: colon cancer   . FHx: prostate cancer   . Nephrolithiasis 09/2102  . Occlusion of right vertebral artery 05/2015    Past Surgical History:  Procedure Laterality Date  . CATARACT EXTRACTION  2008   Left  . CATARACT EXTRACTION  2013   Right ([facility])  . COLONOSCOPY  ~2010   medium int hemorrhoids, o/w WNL, rpt 5 yrs given fmhx (Dr. Oletta Lamas)  . exercise treadmill  11/2005   WNL [facility])  . HERNIA REPAIR  2008   Right  . MOHS SURGERY Left spring 2014   basal cell face  . Testicular Biopsy  2003   benign, varicocele  . US ECHOCARDIOGRAPHY  11/2005   aortic sclerosis, EF 28-36%, diastolic dysfunction    Social History   Social History  . Marital status: Married    Spouse name: N/A  . Number of children: N/A  . Years of education: N/A   Occupational History  . Not on file.   Social History Main Topics  . Smoking status: Never Smoker  . Smokeless tobacco: Never Used  . Alcohol use 0.0 oz/week     Comment: rare  . Drug use: No  . Sexual activity: Not on file   Other Topics Concern  . Not on  file   Social History Narrative   Caffeine: 2-3 cups coffee/day   Lives with wife and adult son, 1 dog   Occupation: Counsellor   Edu: college   Activity: golf, no regular exercise   Diet: good amt water, daily fruits/vegetables    Family History  Problem Relation Age of Onset  . Cancer Father 32    prostate  . Cancer Paternal Grandfather 59    prostate  . Coronary artery disease Father 75    MI  . Stroke Mother   . Coronary artery disease Maternal Uncle   . Hypertension Mother   . Diabetes Neg Hx   . Cancer Mother 24    colon  . Cancer Sister 60    breast    No Known Allergies  Medication list reviewed and updated in full in [place].  GEN: No fevers, chills. Nontoxic. Primarily MSK c/o  today. MSK: Detailed in the HPI GI: tolerating PO intake without difficulty Neuro: No numbness, parasthesias, or tingling associated. Otherwise the pertinent positives of the ROS are noted above.   Objective:   BP 120/78   Pulse 63   Temp 98.5 F (36.9 C) (Oral)   Ht 5' 7.5" (1.715 m)   Wt 189 lb 8 oz (86 kg)   BMI 29.24 kg/m    GEN: WDWN, NAD, Non-toxic, Alert & Oriented x 3 HEENT: Atraumatic, Normocephalic.  Ears and Nose: No external deformity. EXTR: No clubbing/cyanosis/edema NEURO: Normal gait.  PSYCH: Normally interactive. Conversant. Not depressed or anxious appearing.  Calm demeanor.   Knee:  R Gait: Normal heel toe pattern ROM: 0-125 Effusion: neg Echymosis or edema: none Patellar tendon NT Painful PLICA: neg Patellar grind: negative Medial and lateral patellar facet loading: negative medial and lateral joint lines:NT Mcmurray's neg Flexion-pinch neg Varus and valgus stress: stable Lachman: neg Ant and Post drawer: neg Hip abduction, IR, ER: WNL Hip flexion str: 5/5 Hip abd: 5/5 Quad: 5/5 VMO atrophy:No Hamstring concentric and eccentric: 5/5   Radiology: No results found.  Assessment and Plan:   Right knee pain   Right knee pain, by history more likely posterior lateral corner such as lateral gastroc tendon, popliteus, or other structure on the posterior lateral corner. Distal hamstring tendon also possible. Now these are not very aggravated, and difficult to elicit pain.  Reviewed basic rehabilitation with him with a walking protocol on heels, and also recommended scheduled NSAIDs over the next 2 weeks. Continued ice when necessary.   Follow-up: No Follow-up on file.  Signed,  Maud Deed. Mcadoo Muzquiz, MD   Patient's Medications  New Prescriptions   No medications on file  Previous Medications   ASPIRIN 81 MG TABLET    Take 81 mg by mouth daily.   ATORVASTATIN (LIPITOR) 40 MG TABLET    Take 1 tablet (40 mg total) by mouth every other day.    MULTIPLE VITAMIN (MULTIVITAMIN) TABLET    Take 1 tablet by mouth daily.   OMEGA-3 FATTY ACIDS (FISH OIL) 1000 MG CAPS    Take 1,000 mg by mouth daily.  Modified Medications   No medications on file  Discontinued Medications   No medications on file

## 2016-06-02 ENCOUNTER — Other Ambulatory Visit: Payer: Self-pay | Admitting: Family Medicine

## 2016-06-02 DIAGNOSIS — E785 Hyperlipidemia, unspecified: Secondary | ICD-10-CM

## 2016-06-02 DIAGNOSIS — N400 Enlarged prostate without lower urinary tract symptoms: Secondary | ICD-10-CM

## 2016-06-02 DIAGNOSIS — N401 Enlarged prostate with lower urinary tract symptoms: Secondary | ICD-10-CM | POA: Diagnosis not present

## 2016-06-02 DIAGNOSIS — R3912 Poor urinary stream: Secondary | ICD-10-CM | POA: Diagnosis not present

## 2016-06-04 ENCOUNTER — Other Ambulatory Visit (INDEPENDENT_AMBULATORY_CARE_PROVIDER_SITE_OTHER): Payer: Medicare Other

## 2016-06-04 DIAGNOSIS — L578 Other skin changes due to chronic exposure to nonionizing radiation: Secondary | ICD-10-CM | POA: Diagnosis not present

## 2016-06-04 DIAGNOSIS — L820 Inflamed seborrheic keratosis: Secondary | ICD-10-CM | POA: Diagnosis not present

## 2016-06-04 DIAGNOSIS — L814 Other melanin hyperpigmentation: Secondary | ICD-10-CM | POA: Diagnosis not present

## 2016-06-04 DIAGNOSIS — E785 Hyperlipidemia, unspecified: Secondary | ICD-10-CM

## 2016-06-04 DIAGNOSIS — N400 Enlarged prostate without lower urinary tract symptoms: Secondary | ICD-10-CM

## 2016-06-04 DIAGNOSIS — L821 Other seborrheic keratosis: Secondary | ICD-10-CM | POA: Diagnosis not present

## 2016-06-04 DIAGNOSIS — L570 Actinic keratosis: Secondary | ICD-10-CM | POA: Diagnosis not present

## 2016-06-04 DIAGNOSIS — D225 Melanocytic nevi of trunk: Secondary | ICD-10-CM | POA: Diagnosis not present

## 2016-06-04 LAB — COMPREHENSIVE METABOLIC PANEL
ALK PHOS: 64 U/L (ref 39–117)
ALT: 21 U/L (ref 0–53)
AST: 22 U/L (ref 0–37)
Albumin: 3.9 g/dL (ref 3.5–5.2)
BUN: 19 mg/dL (ref 6–23)
CO2: 29 meq/L (ref 19–32)
Calcium: 9 mg/dL (ref 8.4–10.5)
Chloride: 105 mEq/L (ref 96–112)
Creatinine, Ser: 0.93 mg/dL (ref 0.40–1.50)
GFR: 85.19 mL/min (ref >60.00)
GLUCOSE: 96 mg/dL (ref 70–99)
POTASSIUM: 4.4 meq/L (ref 3.5–5.1)
SODIUM: 140 meq/L (ref 135–145)
TOTAL PROTEIN: 6.9 g/dL (ref 6.0–8.3)
Total Bilirubin: 0.8 mg/dL (ref 0.2–1.2)

## 2016-06-04 LAB — LIPID PANEL
CHOL/HDL RATIO: 3
Cholesterol: 165 mg/dL (ref 0–200)
HDL: 53.7 mg/dL (ref >39.00)
LDL Cholesterol: 95 mg/dL (ref 0–99)
NONHDL: 111.08
Triglycerides: 80 mg/dL (ref 0.0–149.0)
VLDL: 16 mg/dL (ref 0.0–40.0)

## 2016-06-04 LAB — PSA: PSA: 1.25 ng/mL (ref 0.10–4.00)

## 2016-06-11 ENCOUNTER — Encounter: Payer: Medicare Other | Admitting: Family Medicine

## 2016-06-12 ENCOUNTER — Other Ambulatory Visit: Payer: Self-pay | Admitting: Family Medicine

## 2016-06-12 DIAGNOSIS — I6523 Occlusion and stenosis of bilateral carotid arteries: Secondary | ICD-10-CM

## 2016-06-25 ENCOUNTER — Ambulatory Visit (HOSPITAL_COMMUNITY)
Admission: RE | Admit: 2016-06-25 | Discharge: 2016-06-25 | Disposition: A | Discharge status: Home / Self Care | Payer: Medicare Other | Source: Ambulatory Visit | Attending: Cardiology | Admitting: Cardiology

## 2016-06-25 DIAGNOSIS — I6523 Occlusion and stenosis of bilateral carotid arteries: Secondary | ICD-10-CM | POA: Insufficient documentation

## 2016-06-25 DIAGNOSIS — E785 Hyperlipidemia, unspecified: Secondary | ICD-10-CM | POA: Insufficient documentation

## 2016-06-26 ENCOUNTER — Encounter: Payer: Self-pay | Admitting: *Deleted

## 2016-06-30 DIAGNOSIS — M25561 Pain in right knee: Secondary | ICD-10-CM | POA: Diagnosis not present

## 2016-07-01 ENCOUNTER — Encounter: Payer: Self-pay | Admitting: Family Medicine

## 2016-07-01 ENCOUNTER — Ambulatory Visit (INDEPENDENT_AMBULATORY_CARE_PROVIDER_SITE_OTHER): Payer: Medicare Other | Admitting: Family Medicine

## 2016-07-01 VITALS — BP 124/80 | HR 72 | Temp 97.8°F | Ht 67.5 in | Wt 194.5 lb

## 2016-07-01 DIAGNOSIS — R011 Cardiac murmur, unspecified: Secondary | ICD-10-CM | POA: Diagnosis not present

## 2016-07-01 DIAGNOSIS — E66811 Obesity, class 1: Secondary | ICD-10-CM

## 2016-07-01 DIAGNOSIS — I6523 Occlusion and stenosis of bilateral carotid arteries: Secondary | ICD-10-CM

## 2016-07-01 DIAGNOSIS — Z0000 Encounter for general adult medical examination without abnormal findings: Secondary | ICD-10-CM

## 2016-07-01 DIAGNOSIS — E669 Obesity, unspecified: Secondary | ICD-10-CM

## 2016-07-01 DIAGNOSIS — I358 Other nonrheumatic aortic valve disorders: Secondary | ICD-10-CM | POA: Insufficient documentation

## 2016-07-01 DIAGNOSIS — Z8249 Family history of ischemic heart disease and other diseases of the circulatory system: Secondary | ICD-10-CM

## 2016-07-01 DIAGNOSIS — R739 Hyperglycemia, unspecified: Secondary | ICD-10-CM

## 2016-07-01 DIAGNOSIS — Z8042 Family history of malignant neoplasm of prostate: Secondary | ICD-10-CM

## 2016-07-01 DIAGNOSIS — Z800 Family history of malignant neoplasm of digestive organs: Secondary | ICD-10-CM

## 2016-07-01 DIAGNOSIS — Z23 Encounter for immunization: Secondary | ICD-10-CM | POA: Diagnosis not present

## 2016-07-01 DIAGNOSIS — N400 Enlarged prostate without lower urinary tract symptoms: Secondary | ICD-10-CM

## 2016-07-01 DIAGNOSIS — I350 Nonrheumatic aortic (valve) stenosis: Secondary | ICD-10-CM | POA: Insufficient documentation

## 2016-07-01 DIAGNOSIS — Z7189 Other specified counseling: Secondary | ICD-10-CM

## 2016-07-01 DIAGNOSIS — R9431 Abnormal electrocardiogram [ECG] [EKG]: Secondary | ICD-10-CM

## 2016-07-01 DIAGNOSIS — I6502 Occlusion and stenosis of left vertebral artery: Secondary | ICD-10-CM

## 2016-07-01 DIAGNOSIS — E785 Hyperlipidemia, unspecified: Secondary | ICD-10-CM

## 2016-07-01 NOTE — Assessment & Plan Note (Signed)
improved

## 2016-07-01 NOTE — Assessment & Plan Note (Signed)
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

## 2016-07-01 NOTE — Assessment & Plan Note (Signed)
Agrees to cologuard today.

## 2016-07-01 NOTE — Assessment & Plan Note (Signed)
Stable, followed by Dr Junious Silk

## 2016-07-01 NOTE — Assessment & Plan Note (Addendum)
Advanced directives: packet provided today. Would want wife to be HCPOA. Will work on this.

## 2016-07-01 NOTE — Assessment & Plan Note (Signed)
Discussed healthy diet and lifestyle changes to affect sustainable weight loss  

## 2016-07-01 NOTE — Assessment & Plan Note (Signed)
Preventative protocols reviewed and updated unless pt declined. Discussed healthy diet and lifestyle.  

## 2016-07-01 NOTE — Assessment & Plan Note (Signed)
Chronic, stable on lipitor and fish oil.

## 2016-07-01 NOTE — Assessment & Plan Note (Signed)
Chronic, stable. Continue to monitor.

## 2016-07-01 NOTE — Patient Instructions (Addendum)
Flu shot today We will sign you up for cologuard. EKG today Call your insurance about the current shingles shot (zostavax) to see if it is covered or how much it would cost and where is cheaper (here or pharmacy).  If you want to receive here, call for nurse visit. Or call us in 3-6 months to ask about new shingles shot (shingrix).  Look into 5 wishes form online.  Aquatic exercise is easy on joints and a good source of aerobic exercise Return as needed or in 1 year for next medicare wellness visit  Health Maintenance, Male A healthy lifestyle and preventative care can promote health and wellness.  Maintain regular health, dental, and eye exams.  Eat a healthy diet. Foods like vegetables, fruits, whole grains, low-fat dairy products, and lean protein foods contain the nutrients you need and are low in calories. Decrease your intake of foods high in solid fats, added sugars, and salt. Get information about a proper diet from your health care provider, if necessary.  Regular physical exercise is one of the most important things you can do for your health. Most adults should get at least 150 minutes of moderate-intensity exercise (any activity that increases your heart rate and causes you to sweat) each week. In addition, most adults need muscle-strengthening exercises on 2 or more days a week.   Maintain a healthy weight. The body mass index (BMI) is a screening tool to identify possible weight problems. It provides an estimate of body fat based on height and weight. Your health care provider can find your BMI and can help you achieve or maintain a healthy weight. For males 20 years and older:  A BMI below 18.5 is considered underweight.  A BMI of 18.5 to 24.9 is normal.  A BMI of 25 to 29.9 is considered overweight.  A BMI of 30 and above is considered obese.  Maintain normal blood lipids and cholesterol by exercising and minimizing your intake of saturated fat. Eat a balanced diet with  plenty of fruits and vegetables. Blood tests for lipids and cholesterol should begin at age 47 and be repeated every 5 years. If your lipid or cholesterol levels are high, you are over age 7, or you are at high risk for heart disease, you may need your cholesterol levels checked more frequently.Ongoing high lipid and cholesterol levels should be treated with medicines if diet and exercise are not working.  If you smoke, find out from your health care provider how to quit. If you do not use tobacco, do not start.  Lung cancer screening is recommended for adults aged 87-80 years who are at high risk for developing lung cancer because of a history of smoking. A yearly low-dose CT scan of the lungs is recommended for people who have at least a 30-pack-year history of smoking and are current smokers or have quit within the past 15 years. A pack year of smoking is smoking an average of 1 pack of cigarettes a day for 1 year (for example, a 30-pack-year history of smoking could mean smoking 1 pack a day for 30 years or 2 packs a day for 15 years). Yearly screening should continue until the smoker has stopped smoking for at least 15 years. Yearly screening should be stopped for people who develop a health problem that would prevent them from having lung cancer treatment.  If you choose to drink alcohol, do not have more than 2 drinks per day. One drink is considered to be 12  oz (360 mL) of beer, 5 oz (150 mL) of wine, or 1.5 oz (45 mL) of liquor.  Avoid the use of street drugs. Do not share needles with anyone. Ask for help if you need support or instructions about stopping the use of drugs.  High blood pressure causes heart disease and increases the risk of stroke. High blood pressure is more likely to develop in:  People who have blood pressure in the end of the normal range (100-139/85-89 mm Hg).  People who are overweight or obese.  People who are African American.  If you are 13-93 years of age, have  your blood pressure checked every 3-5 years. If you are 54 years of age or older, have your blood pressure checked every year. You should have your blood pressure measured twice-once when you are at a hospital or clinic, and once when you are not at a hospital or clinic. Record the average of the two measurements. To check your blood pressure when you are not at a hospital or clinic, you can use:  An automated blood pressure machine at a pharmacy.  A home blood pressure monitor.  If you are 67-33 years old, ask your health care provider if you should take aspirin to prevent heart disease.  Diabetes screening involves taking a blood sample to check your fasting blood sugar level. This should be done once every 3 years after age 28 if you are at a normal weight and without risk factors for diabetes. Testing should be considered at a younger age or be carried out more frequently if you are overweight and have at least 1 risk factor for diabetes.  Colorectal cancer can be detected and often prevented. Most routine colorectal cancer screening begins at the age of 26 and continues through age 24. However, your health care provider may recommend screening at an earlier age if you have risk factors for colon cancer. On a yearly basis, your health care provider may provide home test kits to check for hidden blood in the stool. A small camera at the end of a tube may be used to directly examine the colon (sigmoidoscopy or colonoscopy) to detect the earliest forms of colorectal cancer. Talk to your health care provider about this at age 22 when routine screening begins. A direct exam of the colon should be repeated every 5-10 years through age 34, unless early forms of precancerous polyps or small growths are found.  People who are at an increased risk for hepatitis B should be screened for this virus. You are considered at high risk for hepatitis B if:  You were born in a country where hepatitis B occurs often.  Talk with your health care provider about which countries are considered high risk.  Your parents were born in a high-risk country and you have not received a shot to protect against hepatitis B (hepatitis B vaccine).  You have HIV or AIDS.  You use needles to inject street drugs.  You live with, or have sex with, someone who has hepatitis B.  You are a man who has sex with other men (MSM).  You get hemodialysis treatment.  You take certain medicines for conditions like cancer, organ transplantation, and autoimmune conditions.  Hepatitis C blood testing is recommended for all people born from [birth year] through 1965 and any individual with known risk factors for hepatitis C.  Healthy men should no longer receive prostate-specific antigen (PSA) blood tests as part of routine cancer screening. Talk to your health  care provider about prostate cancer screening.  Testicular cancer screening is not recommended for adolescents or adult males who have no symptoms. Screening includes self-exam, a health care provider exam, and other screening tests. Consult with your health care provider about any symptoms you have or any concerns you have about testicular cancer.  Practice safe sex. Use condoms and avoid high-risk sexual practices to reduce the spread of sexually transmitted infections (STIs).  You should be screened for STIs, including gonorrhea and chlamydia if:  You are sexually active and are younger than 24 years.  You are older than 24 years, and your health care provider tells you that you are at risk for this type of infection.  Your sexual activity has changed since you were last screened, and you are at an increased risk for chlamydia or gonorrhea. Ask your health care provider if you are at risk.  If you are at risk of being infected with HIV, it is recommended that you take a prescription medicine daily to prevent HIV infection. This is called pre-exposure prophylaxis (PrEP). You are  considered at risk if:  You are a man who has sex with other men (MSM).  You are a heterosexual man who is sexually active with multiple partners.  You take drugs by injection.  You are sexually active with a partner who has HIV.  Talk with your health care provider about whether you are at high risk of being infected with HIV. If you choose to begin PrEP, you should first be tested for HIV. You should then be tested every 3 months for as long as you are taking PrEP.  Use sunscreen. Apply sunscreen liberally and repeatedly throughout the day. You should seek shade when your shadow is shorter than you. Protect yourself by wearing long sleeves, pants, a wide-brimmed hat, and sunglasses year round whenever you are outdoors.  Tell your health care provider of new moles or changes in moles, especially if there is a change in shape or color. Also, tell your health care provider if a mole is larger than the size of a pencil eraser.  A one-time screening for abdominal aortic aneurysm (AAA) and surgical repair of large AAAs by ultrasound is recommended for men aged 63-75 years who are current or former smokers.  Stay current with your vaccines (immunizations). This information is not intended to replace advice given to you by your health care provider. Make sure you discuss any questions you have with your health care provider. Document Released: 12/12/2007 Document Revised: 07/06/2014 Document Reviewed: 03/19/2015 Elsevier Interactive Patient Education  2017 [employer].

## 2016-07-01 NOTE — Assessment & Plan Note (Addendum)

## 2016-07-01 NOTE — Assessment & Plan Note (Signed)
Sees urology

## 2016-07-01 NOTE — Progress Notes (Signed)
Pre visit review using our clinic review tool, if applicable. No additional management support is needed unless otherwise documented below in the visit note. 

## 2016-07-01 NOTE — Assessment & Plan Note (Signed)
Reviewed with patient

## 2016-07-01 NOTE — Progress Notes (Addendum)
BP 124/80   Pulse 72   Temp 97.8 F (36.6 C) (Oral)   Ht 5' 7.5" (1.715 m)   Wt 194 lb 8 oz (88.2 kg)   BMI 30.01 kg/m    CC: medicare wellness visit Subjective:    Patient ID: Larry Kent, male    DOB: 1946/06/17, 71 y.o.   MRN: [id number]  HPI: Larry Kent is a 71 y.o. male presenting on 07/01/2016 for Annual Exam   Carotid stenosis - stable by [place] 05/2016.  Recent AKs frozen by Dr Allyson Sabal.  Saw [place] ortho yesterday with xray - told arthritis and baker's cyst.   Normal hearing today. Vision screen - failed [facility] (20/200). Uses [facility] for reading. S/p cataract surgery.  Falls - denies in last year. Denies depression/anhedonia/sadness.  Preventative: COLONOSCOPY Date: ~2010 medium int hemorrhoids, o/w WNL, rpt 5 yrs given fmhx (Dr. Oletta Lamas) - discussed. Requests cologuard. ifob normal last year.  Prostate screening - sees Dr Junious Silk yearly at [facility]. H/o BPH, fmhx prostate cancer. H/o kidney stones as well.  Flu shot - today Td - 2013 Pneumovax - 2013, prevnar 2015 Shingles shot - will check on this  Advanced directives: packet provided today. Would want wife to be HCPOA. Will work on this.  Seat belt use discussed Sunscreen use discussed. No changing moles on skin. Sees Dr Allyson Sabal.  Non smoker  Alcohol - rare  Caffeine: 2-3 cups coffee/day Lives with wife and adult son, 1 dog Occupation: Counsellor Edu: college Activity: golf, no regular exercise, occasionally walks with wife Diet: good amt water, daily fruits/vegetables, fish several times a week  Relevant past medical, surgical, family and social history reviewed and updated as indicated. Interim medical history since our last visit reviewed. Allergies and medications reviewed and updated. Current Outpatient Prescriptions on File Prior to Visit  Medication Sig  . aspirin 81 MG tablet Take 81 mg by mouth daily.  Marland Kitchen atorvastatin (LIPITOR) 40 MG tablet Take 1 tablet (40 mg total) by mouth every  other day.  . Multiple Vitamin (MULTIVITAMIN) tablet Take 1 tablet by mouth daily.  . Omega-3 Fatty Acids (FISH OIL) 1000 MG CAPS Take 1,000 mg by mouth daily.   No current facility-administered medications on file prior to visit.     Review of Systems  Constitutional: Negative for activity change, appetite change, chills, fatigue, fever and unexpected weight change.  HENT: Negative for hearing loss.   Eyes: Negative for visual disturbance.  Respiratory: Negative for cough, chest tightness, shortness of breath and wheezing.   Cardiovascular: Negative for chest pain, palpitations and leg swelling.  Gastrointestinal: Negative for abdominal distention, abdominal pain, blood in stool, constipation, diarrhea, nausea and vomiting.  Genitourinary: Negative for difficulty urinating and hematuria.  Musculoskeletal: Negative for arthralgias, myalgias and neck pain.  Skin: Negative for rash.  Neurological: Negative for dizziness, seizures, syncope and headaches.  Hematological: Negative for adenopathy. Does not bruise/bleed easily.  Psychiatric/Behavioral: Negative for dysphoric mood. The patient is not nervous/anxious.    Per HPI unless specifically indicated in ROS section     Objective:    BP 124/80   Pulse 72   Temp 97.8 F (36.6 C) (Oral)   Ht 5' 7.5" (1.715 m)   Wt 194 lb 8 oz (88.2 kg)   BMI 30.01 kg/m   Wt Readings from Last 3 Encounters:  07/01/16 194 lb 8 oz (88.2 kg)  03/25/16 189 lb 8 oz (86 kg)  08/19/15 201 lb (91.2 kg)  Physical Exam  Constitutional: He is oriented to person, place, and time. He appears well-developed and well-nourished. No distress.  HENT:  Head: Normocephalic and atraumatic.  Right Ear: Hearing, tympanic membrane, external ear and ear canal normal.  Left Ear: Hearing, tympanic membrane, external ear and ear canal normal.  Nose: Nose normal.  Mouth/Throat: Uvula is midline, oropharynx is clear and moist and mucous membranes are normal. No  oropharyngeal exudate, posterior oropharyngeal edema or posterior oropharyngeal erythema.  Eyes: Conjunctivae and EOM are normal. Pupils are equal, round, and reactive to light. No scleral icterus.  Neck: Normal range of motion. Neck supple. Carotid bruit is present (mild bilateral). No thyromegaly present.  Cardiovascular: Normal rate, regular rhythm and intact distal pulses.   Murmur (3/6) heard. Pulses:      Radial pulses are 2+ on the right side, and 2+ on the left side.  Pulmonary/Chest: Effort normal and breath sounds normal. No respiratory distress. He has no wheezes. He has no rales.  Abdominal: Soft. Bowel sounds are normal. He exhibits no distension and no mass. There is no tenderness. There is no rebound and no guarding.  Musculoskeletal: Normal range of motion. He exhibits no edema.  Lymphadenopathy:    He has no cervical adenopathy.  Neurological: He is alert and oriented to person, place, and time.  CN grossly intact, station and gait intact Recall 3/3 Calculation 5/5 serial 7s  Skin: Skin is warm and dry. No rash noted.  Psychiatric: He has a normal mood and affect. His behavior is normal. Judgment and thought content normal.  Nursing note and vitals reviewed.  Results for orders placed or performed in visit on 06/04/16  Lipid panel  Result Value Ref Range   Cholesterol 165 0 - 200 mg/dL   Triglycerides 80.0 0.0 - 149.0 mg/dL   HDL 53.70 >39.00 mg/dL   VLDL 16.0 0.0 - 40.0 mg/dL   LDL Cholesterol 95 0 - 99 mg/dL   Total CHOL/HDL Ratio 3    NonHDL 111.08   Comprehensive metabolic panel  Result Value Ref Range   Sodium 140 135 - 145 mEq/L   Potassium 4.4 3.5 - 5.1 mEq/L   Chloride 105 96 - 112 mEq/L   CO2 29 19 - 32 mEq/L   Glucose, Bld 96 70 - 99 mg/dL   BUN 19 6 - 23 mg/dL   Creatinine, Ser 0.93 0.40 - 1.50 mg/dL   Total Bilirubin 0.8 0.2 - 1.2 mg/dL   Alkaline Phosphatase 64 39 - 117 U/L   AST 22 0 - 37 U/L   ALT 21 0 - 53 U/L   Total Protein 6.9 6.0 - 8.3  g/dL   Albumin 3.9 3.5 - 5.2 g/dL   Calcium 9.0 8.4 - 10.5 mg/dL   GFR 85.19 >60.00 mL/min  PSA  Result Value Ref Range   PSA 1.25 0.10 - 4.00 ng/mL   EKG - NSR rate 60s, normal axis, intervals, no acute ST/T changes, Q waves inferiorly unchanged from prior EKG 2007    Assessment & Plan:   Problem List Items Addressed This Visit    Advanced care planning/counseling discussion    Advanced directives: packet provided today. Would want wife to be HCPOA. Will work on this.      Benign prostatic hyperplasia    Stable, followed by Dr Junious Silk      Carotid stenosis    Chronic, stable.       FHx: colon cancer    Agrees to cologuard today.  FHx: prostate cancer    Sees urology      Health maintenance examination    Preventative protocols reviewed and updated unless pt declined. Discussed healthy diet and lifestyle.       HLD (hyperlipidemia)    Chronic, stable on lipitor and fish oil.       Hyperglycemia    improved      Medicare annual wellness visit, subsequent - Primary    I have personally reviewed the Medicare Annual Wellness questionnaire and have noted 1. The patient's medical and social history 2. Their use of alcohol, tobacco or illicit drugs 3. Their current medications and supplements 4. The patient's functional ability including ADL's, fall risks, home safety risks and hearing or visual impairment. Cognitive function has been assessed and addressed as indicated.  5. Diet and physical activity 6. Evidence for depression or mood disorders The patients weight, height, BMI have been recorded in the chart. I have made referrals, counseling and provided education to the patient based on review of the above and I have provided the pt with a written personalized care plan for preventive services. Provider list updated.. See scanned questionairre as needed for further documentation. Reviewed preventative protocols and updated unless pt declined.         Nonspecific abnormal electrocardiogram (ECG) (EKG)    Signs of old inferior infarct, unchanged from prior EKG dated 2007, s/p normal stress test 2007.       Obesity, Class I, BMI 30-34.9    Discussed healthy diet and lifestyle changes to affect sustainable weight loss.       Occlusion of left vertebral artery    Reviewed with patient.       Systolic murmur    Chronic, stable. Continue to monitor.       Other Visit Diagnoses    Need for influenza vaccination       Relevant Orders   Flu Vaccine QUAD 36+ mos PF IM (Fluarix & Fluzone Quad PF) (Completed)   Family history of coronary artery disease       Relevant Orders   EKG 12-Lead (Completed)       Follow up plan: Return in about 1 year (around 07/01/2017) for medicare wellness visit.  Ria Bush, MD

## 2016-07-01 NOTE — Assessment & Plan Note (Signed)
Chronic, stable 

## 2016-07-17 DIAGNOSIS — H5213 Myopia, bilateral: Secondary | ICD-10-CM | POA: Diagnosis not present

## 2016-07-17 DIAGNOSIS — H04123 Dry eye syndrome of bilateral lacrimal glands: Secondary | ICD-10-CM | POA: Diagnosis not present

## 2016-07-17 DIAGNOSIS — H02831 Dermatochalasis of right upper eyelid: Secondary | ICD-10-CM | POA: Diagnosis not present

## 2016-07-17 DIAGNOSIS — H02834 Dermatochalasis of left upper eyelid: Secondary | ICD-10-CM | POA: Diagnosis not present

## 2016-07-29 DIAGNOSIS — M1711 Unilateral primary osteoarthritis, right knee: Secondary | ICD-10-CM | POA: Diagnosis not present

## 2016-07-29 DIAGNOSIS — M25561 Pain in right knee: Secondary | ICD-10-CM | POA: Diagnosis not present

## 2016-07-31 DIAGNOSIS — Z1212 Encounter for screening for malignant neoplasm of rectum: Secondary | ICD-10-CM | POA: Diagnosis not present

## 2016-07-31 DIAGNOSIS — Z1211 Encounter for screening for malignant neoplasm of colon: Secondary | ICD-10-CM | POA: Diagnosis not present

## 2016-08-05 DIAGNOSIS — H02413 Mechanical ptosis of bilateral eyelids: Secondary | ICD-10-CM | POA: Diagnosis not present

## 2016-08-07 LAB — COLOGUARD: Cologuard: NEGATIVE

## 2016-08-12 ENCOUNTER — Encounter: Payer: Self-pay | Admitting: *Deleted

## 2016-08-17 DIAGNOSIS — L570 Actinic keratosis: Secondary | ICD-10-CM | POA: Diagnosis not present

## 2016-09-23 DIAGNOSIS — H02834 Dermatochalasis of left upper eyelid: Secondary | ICD-10-CM | POA: Diagnosis not present

## 2016-09-23 DIAGNOSIS — H02413 Mechanical ptosis of bilateral eyelids: Secondary | ICD-10-CM | POA: Diagnosis not present

## 2016-09-23 DIAGNOSIS — H02831 Dermatochalasis of right upper eyelid: Secondary | ICD-10-CM | POA: Diagnosis not present

## 2016-09-29 DIAGNOSIS — M25561 Pain in right knee: Secondary | ICD-10-CM | POA: Diagnosis not present

## 2016-09-29 DIAGNOSIS — M1711 Unilateral primary osteoarthritis, right knee: Secondary | ICD-10-CM | POA: Diagnosis not present

## 2016-10-08 DIAGNOSIS — M25561 Pain in right knee: Secondary | ICD-10-CM | POA: Diagnosis not present

## 2016-10-08 DIAGNOSIS — M1711 Unilateral primary osteoarthritis, right knee: Secondary | ICD-10-CM | POA: Diagnosis not present

## 2016-10-16 DIAGNOSIS — M1711 Unilateral primary osteoarthritis, right knee: Secondary | ICD-10-CM | POA: Diagnosis not present

## 2016-10-23 DIAGNOSIS — M1711 Unilateral primary osteoarthritis, right knee: Secondary | ICD-10-CM | POA: Diagnosis not present

## 2016-11-10 ENCOUNTER — Other Ambulatory Visit: Payer: Self-pay | Admitting: Family Medicine

## 2016-11-22 ENCOUNTER — Encounter: Payer: Self-pay | Admitting: Medical Oncology

## 2016-11-22 ENCOUNTER — Emergency Department
Admission: EM | Admit: 2016-11-22 | Discharge: 2016-11-22 | Disposition: A | Discharge status: Home / Self Care | Payer: Medicare Other | Attending: Emergency Medicine | Admitting: Emergency Medicine

## 2016-11-22 DIAGNOSIS — M791 Myalgia: Secondary | ICD-10-CM | POA: Diagnosis not present

## 2016-11-22 DIAGNOSIS — B349 Viral infection, unspecified: Secondary | ICD-10-CM | POA: Diagnosis not present

## 2016-11-22 DIAGNOSIS — Z7982 Long term (current) use of aspirin: Secondary | ICD-10-CM | POA: Insufficient documentation

## 2016-11-22 DIAGNOSIS — R509 Fever, unspecified: Secondary | ICD-10-CM | POA: Diagnosis present

## 2016-11-22 DIAGNOSIS — Z7902 Long term (current) use of antithrombotics/antiplatelets: Secondary | ICD-10-CM | POA: Insufficient documentation

## 2016-11-22 DIAGNOSIS — R001 Bradycardia, unspecified: Secondary | ICD-10-CM | POA: Diagnosis not present

## 2016-11-22 LAB — URINALYSIS, COMPLETE (UACMP) WITH MICROSCOPIC
BACTERIA UA: NONE SEEN
Bilirubin Urine: NEGATIVE
Glucose, UA: NEGATIVE mg/dL
KETONES UR: NEGATIVE mg/dL
LEUKOCYTES UA: NEGATIVE
Nitrite: NEGATIVE
PROTEIN: NEGATIVE mg/dL
Specific Gravity, Urine: 1.009 (ref 1.005–1.030)
Squamous Epithelial / LPF: NONE SEEN
pH: 5 (ref 5.0–8.0)

## 2016-11-22 LAB — CBC
HCT: 43.2 % (ref 40.0–52.0)
HEMOGLOBIN: 14.8 g/dL (ref 13.0–18.0)
MCH: 34 pg (ref 26.0–34.0)
MCHC: 34.1 g/dL (ref 32.0–36.0)
MCV: 99.7 fL (ref 80.0–100.0)
Platelets: 167 10*3/uL (ref 150–440)
RBC: 4.34 MIL/uL — ABNORMAL LOW (ref 4.40–5.90)
RDW: 13.5 % (ref 11.5–14.5)
WBC: 4.8 10*3/uL (ref 3.8–10.6)

## 2016-11-22 LAB — COMPREHENSIVE METABOLIC PANEL
ALK PHOS: 56 U/L (ref 38–126)
ALT: 28 U/L (ref 17–63)
AST: 34 U/L (ref 15–41)
Albumin: 3.8 g/dL (ref 3.5–5.0)
Anion gap: 8 (ref 5–15)
BUN: 13 mg/dL (ref 6–20)
CALCIUM: 9 mg/dL (ref 8.9–10.3)
CO2: 25 mmol/L (ref 22–32)
Chloride: 102 mmol/L (ref 101–111)
Creatinine, Ser: 1 mg/dL (ref 0.61–1.24)
GFR calc non Af Amer: >60 mL/min (ref >60)
Glucose, Bld: 125 mg/dL — ABNORMAL HIGH (ref 65–99)
POTASSIUM: 3.9 mmol/L (ref 3.5–5.1)
SODIUM: 135 mmol/L (ref 135–145)
TOTAL PROTEIN: 7.1 g/dL (ref 6.5–8.1)
Total Bilirubin: 0.8 mg/dL (ref 0.3–1.2)

## 2016-11-22 LAB — INFLUENZA PANEL BY PCR (TYPE A & B)
INFLAPCR: NEGATIVE
INFLBPCR: NEGATIVE

## 2016-11-22 MED ORDER — IBUPROFEN 800 MG PO TABS
800.0000 mg | ORAL_TABLET | Freq: Once | ORAL | Status: AC
  Administered 2016-11-22: 800 mg via ORAL
  Filled 2016-11-22: qty 1

## 2016-11-22 MED ORDER — IBUPROFEN 800 MG PO TABS: 800.0000 mg | ORAL_TABLET | Freq: Three times a day (TID) | ORAL | 0 refills | Status: DC | PRN

## 2016-11-22 NOTE — ED Notes (Signed)
Called lab re: blood work sent around 1615 and is not showing in process.  Lab states they have the blood in the lab.

## 2016-11-22 NOTE — Discharge Instructions (Signed)
Please alternate Tylenol and ibuprofen as needed for fevers, body aches, chills. Make sure you drinking lots of fluids. If any fevers above 101.1 but not responding to Tylenol and ibuprofen return to the ER. Return to the ER for any worsening symptoms urgent changes in her health. Follow-up with primary care provider in 3-5 days if no improvement of symptoms.

## 2016-11-22 NOTE — ED Provider Notes (Signed)
EKG reviewed by me at 1625 Ventricular rate 75 QRS 100 QTc 420 Normal sinus rhythm with  frequent PVCs. There is no noted evidence of ischemia, old Q waves are noted in inferior distribution. In review of EKG from 07/01/2017, Q waves are old. PVCs are now frequent   Larry Kitten, MD 11/22/16 [phone]

## 2016-11-22 NOTE — ED Triage Notes (Signed)
Pt reports last night having body aches and low grade fever. Pt denies others sx's. Reports frequent yard work but no ticks that he is aware of.

## 2016-11-22 NOTE — ED Provider Notes (Signed)
[facility] Provider Note   CSN: [id number] Arrival date & time: 11/22/16  1505     History   Chief Complaint Chief Complaint  Patient presents with  . Fever  . Generalized Body Aches    HPI Larry Kent is a 71 y.o. male presents to the emergency department for evaluation of low-grade fever, body aches. Symptoms have been present times one day. He's had low-grade fevers up to 100.4. He's been taking Aleve and occasional Tylenol which is not helping with fevers and body aches. He denies any coughing, congestion, runny nose, abdominal pain, chest pain, shortness of breath, rashes. He has generalized body aches that are mild. He denies any insect or tick bites. Did have a recent injection into his right knee but knee is feeling significantly better. No chest pain, shortness of breath.  HPI  Past Medical History:  Diagnosis Date  . BCC (basal cell carcinoma), face 2014   L preauricular s/p MOHS  . BPH (benign prostatic hypertrophy)    Dr. Junious Silk @ [facility]  . Carotid stenosis 33/8250   RICA 53-97%, LICA 6-73%, L vertebral occlusion, rpt 1 yr  . FHx: colon cancer   . FHx: prostate cancer   . Nephrolithiasis 09/2102  . Occlusion of right vertebral artery 05/2015    Patient Active Problem List   Diagnosis Date Noted  . Systolic murmur 41/93/7902  . Nonspecific abnormal electrocardiogram (ECG) (EKG) 07/01/2016  . Left foot pain 06/10/2015  . Obesity, Class I, BMI 30-34.9 06/10/2015  . Carotid stenosis 05/30/2015  . Occlusion of left vertebral artery 05/30/2015  . Advanced care planning/counseling discussion 05/23/2014  . Hyperglycemia 05/23/2014  . Arthralgia 05/23/2014  . Health maintenance examination 05/23/2014  . Medicare annual wellness visit, subsequent 02/12/2012  . HLD (hyperlipidemia) 02/12/2012  . Benign prostatic hyperplasia   . FHx: prostate cancer   . FHx: colon cancer     Past Surgical History:  Procedure Laterality Date  . CATARACT  EXTRACTION  2008   Left  . CATARACT EXTRACTION  2013   Right ([facility])  . COLONOSCOPY  ~2010   medium int hemorrhoids, o/w WNL, rpt 5 yrs given fmhx (Dr. Oletta Lamas)  . exercise treadmill  11/2005   WNL [facility])  . HERNIA REPAIR  2008   Right  . MOHS SURGERY Left spring 2014   basal cell face  . Testicular Biopsy  2003   benign, varicocele  . US ECHOCARDIOGRAPHY  11/2005   aortic sclerosis, EF 40-97%, diastolic dysfunction       Home Medications    Prior to Admission medications   Medication Sig Start Date End Date Taking? Authorizing Provider  aspirin 81 MG tablet Take 81 mg by mouth daily.    [provider]  atorvastatin (LIPITOR) 40 MG tablet TAKE 1 TABLET (40 MG TOTAL) BY MOUTH DAILY. 11/10/16   Ria Bush, MD  ibuprofen (ADVIL,MOTRIN) 800 MG tablet Take 1 tablet (800 mg total) by mouth every 8 (eight) hours as needed. 11/22/16   Duanne Guess, PA-C  Multiple Vitamin (MULTIVITAMIN) tablet Take 1 tablet by mouth daily.    [provider]  Omega-3 Fatty Acids (FISH OIL) 1000 MG CAPS Take 1,000 mg by mouth daily.    [provider]    Family History Family History  Problem Relation Age of Onset  . Cancer Father 55       prostate  . Coronary artery disease Father 73       MI  . Cancer Paternal  Grandfather 54       prostate  . Stroke Mother   . Hypertension Mother   . Cancer Mother 47       colon  . Coronary artery disease Maternal Uncle   . Cancer Sister 38       breast  . Diabetes Neg Hx     Social History Social History  Substance Use Topics  . Smoking status: Never Smoker  . Smokeless tobacco: Never Used  . Alcohol use 0.0 oz/week     Comment: rare     Allergies   Patient has no known allergies.   Review of Systems Review of Systems  Constitutional: Positive for fever. Negative for activity change, appetite change and chills.  HENT: Negative for congestion, ear pain, mouth sores, rhinorrhea, sinus pressure, sore  throat and trouble swallowing.   Eyes: Negative for photophobia, pain and discharge.  Respiratory: Negative for cough, chest tightness and shortness of breath.   Cardiovascular: Negative for chest pain and leg swelling.  Gastrointestinal: Negative for abdominal distention, abdominal pain, diarrhea, nausea and vomiting.  Genitourinary: Negative for difficulty urinating and dysuria.  Musculoskeletal: Positive for myalgias. Negative for arthralgias, back pain, gait problem and neck stiffness.  Skin: Negative for color change and rash.  Neurological: Negative for dizziness and headaches.  Hematological: Negative for adenopathy.  Psychiatric/Behavioral: Negative for agitation and behavioral problems.     Physical Exam Updated Vital Signs BP (!) 147/75 (BP Location: Left Arm)   Pulse 62   Temp 99.3 F (37.4 C) (Oral)   Resp 18   Ht 5\' 9"  (1.753 m)   Wt 86.2 kg (190 lb)   SpO2 97%   BMI 28.06 kg/m   Physical Exam  Constitutional: He appears well-developed and well-nourished.  HENT:  Head: Normocephalic and atraumatic.  Right Ear: External ear normal.  Left Ear: External ear normal.  Nose: Nose normal.  Mouth/Throat: Oropharynx is clear and moist. No oropharyngeal exudate.  Eyes: Conjunctivae and EOM are normal. Right eye exhibits no discharge. Left eye exhibits no discharge.  Neck: Normal range of motion. Neck supple.  Cardiovascular: Normal rate, regular rhythm and normal heart sounds.   No murmur heard. Pulmonary/Chest: Effort normal and breath sounds normal. No respiratory distress. He has no wheezes. He has no rales. He exhibits no tenderness.  Abdominal: Soft. Bowel sounds are normal. He exhibits no mass. There is no tenderness. There is no rebound and no guarding.  Musculoskeletal: Normal range of motion. He exhibits no edema.  Lymphadenopathy:    He has cervical adenopathy (mild left posterior).  Neurological: He is alert.  Skin: Skin is warm and dry. No erythema.    Psychiatric: He has a normal mood and affect. His behavior is normal. Judgment and thought content normal.  Nursing note and vitals reviewed.    ED Treatments / Results  Labs (all labs ordered are listed, but only abnormal results are displayed) Labs Reviewed  CBC - Abnormal; Notable for the following:       Result Value   RBC 4.34 (*)    All other components within normal limits  COMPREHENSIVE METABOLIC PANEL - Abnormal; Notable for the following:    Glucose, Bld 125 (*)    All other components within normal limits  URINALYSIS, COMPLETE (UACMP) WITH MICROSCOPIC - Abnormal; Notable for the following:    Color, Urine YELLOW (*)    APPearance CLEAR (*)    Hgb urine dipstick SMALL (*)    All other components within normal  limits  INFLUENZA PANEL BY PCR (TYPE A & B)    EKG  EKG Interpretation None       Radiology No results found.  Procedures Procedures (including critical care time)  Medications Ordered in ED Medications  ibuprofen (ADVIL,MOTRIN) tablet 800 mg (800 mg Oral Given 11/22/16 1609)     Initial Impression / Assessment and Plan / ED Course  I have reviewed the triage vital signs and the nursing notes.  Pertinent labs & imaging results that were available during my care of the patient were reviewed by me and considered in my medical decision making (see chart for details).     71 year old male with low-grade fever, chills, body aches times one day. Concerned about possible tickborne illness but no skin rashes or history of tick bites. He denies any cough cold congestion or runny nose. Symptoms have been improving over the last 24 hours. Ibuprofen here in the emergency department has given the patient additional improvement of his symptoms. Patient will alternate Tylenol and ibuprofen. He is educated on signs and symptoms to return to the ED for. We'll follow up PCP in 3-5 days for recheck.  Final Clinical Impressions(s) / ED Diagnoses   Final diagnoses:   Viral syndrome    New Prescriptions New Prescriptions   IBUPROFEN (ADVIL,MOTRIN) 800 MG TABLET    Take 1 tablet (800 mg total) by mouth every 8 (eight) hours as needed.     Duanne Guess, PA-C 11/22/16 Ether Griffins, MD 11/29/16 [phone]

## 2016-11-22 NOTE — ED Notes (Signed)
Patient denies pain and is resting comfortably.  

## 2017-04-19 ENCOUNTER — Other Ambulatory Visit: Payer: Self-pay | Admitting: *Deleted

## 2017-04-19 DIAGNOSIS — I6523 Occlusion and stenosis of bilateral carotid arteries: Secondary | ICD-10-CM

## 2017-05-31 DIAGNOSIS — M1711 Unilateral primary osteoarthritis, right knee: Secondary | ICD-10-CM | POA: Diagnosis not present

## 2017-05-31 DIAGNOSIS — M25561 Pain in right knee: Secondary | ICD-10-CM | POA: Diagnosis not present

## 2017-06-09 DIAGNOSIS — M25561 Pain in right knee: Secondary | ICD-10-CM | POA: Diagnosis not present

## 2017-06-09 DIAGNOSIS — M1711 Unilateral primary osteoarthritis, right knee: Secondary | ICD-10-CM | POA: Diagnosis not present

## 2017-06-14 DIAGNOSIS — M1711 Unilateral primary osteoarthritis, right knee: Secondary | ICD-10-CM | POA: Diagnosis not present

## 2017-07-04 ENCOUNTER — Other Ambulatory Visit: Payer: Self-pay | Admitting: Family Medicine

## 2017-07-04 DIAGNOSIS — E785 Hyperlipidemia, unspecified: Secondary | ICD-10-CM

## 2017-07-04 DIAGNOSIS — R739 Hyperglycemia, unspecified: Secondary | ICD-10-CM

## 2017-07-04 DIAGNOSIS — N400 Enlarged prostate without lower urinary tract symptoms: Secondary | ICD-10-CM

## 2017-07-06 ENCOUNTER — Ambulatory Visit (INDEPENDENT_AMBULATORY_CARE_PROVIDER_SITE_OTHER): Payer: Medicare Other

## 2017-07-06 VITALS — BP 120/86 | HR 44 | Temp 98.0°F | Ht 67.5 in | Wt 198.0 lb

## 2017-07-06 DIAGNOSIS — Z23 Encounter for immunization: Secondary | ICD-10-CM | POA: Diagnosis not present

## 2017-07-06 DIAGNOSIS — R739 Hyperglycemia, unspecified: Secondary | ICD-10-CM | POA: Diagnosis not present

## 2017-07-06 DIAGNOSIS — E785 Hyperlipidemia, unspecified: Secondary | ICD-10-CM | POA: Diagnosis not present

## 2017-07-06 DIAGNOSIS — N400 Enlarged prostate without lower urinary tract symptoms: Secondary | ICD-10-CM

## 2017-07-06 DIAGNOSIS — Z0000 Encounter for general adult medical examination without abnormal findings: Secondary | ICD-10-CM

## 2017-07-06 LAB — COMPREHENSIVE METABOLIC PANEL
ALBUMIN: 4 g/dL (ref 3.5–5.2)
ALK PHOS: 62 U/L (ref 39–117)
ALT: 21 U/L (ref 0–53)
AST: 21 U/L (ref 0–37)
BUN: 15 mg/dL (ref 6–23)
CALCIUM: 8.9 mg/dL (ref 8.4–10.5)
CHLORIDE: 105 meq/L (ref 96–112)
CO2: 28 mEq/L (ref 19–32)
Creatinine, Ser: 0.99 mg/dL (ref 0.40–1.50)
GFR: 79.02 mL/min (ref >60.00)
Glucose, Bld: 107 mg/dL — ABNORMAL HIGH (ref 70–99)
POTASSIUM: 4.2 meq/L (ref 3.5–5.1)
Sodium: 139 mEq/L (ref 135–145)
TOTAL PROTEIN: 6.7 g/dL (ref 6.0–8.3)
Total Bilirubin: 0.9 mg/dL (ref 0.2–1.2)

## 2017-07-06 LAB — LIPID PANEL
Cholesterol: 144 mg/dL (ref 0–200)
HDL: 54.6 mg/dL (ref >39.00)
LDL Cholesterol: 73 mg/dL (ref 0–99)
NonHDL: 89.7
TRIGLYCERIDES: 83 mg/dL (ref 0.0–149.0)
Total CHOL/HDL Ratio: 3
VLDL: 16.6 mg/dL (ref 0.0–40.0)

## 2017-07-06 LAB — PSA: PSA: 1.03 ng/mL (ref 0.10–4.00)

## 2017-07-06 LAB — HEMOGLOBIN A1C: Hgb A1c MFr Bld: 6.1 % (ref 4.6–6.5)

## 2017-07-06 NOTE — Progress Notes (Signed)
Pre visit review using our clinic review tool, if applicable. No additional management support is needed unless otherwise documented below in the visit note. 

## 2017-07-06 NOTE — Patient Instructions (Signed)
Larry Kent , Thank you for taking time to come for your Medicare Wellness Visit. I appreciate your ongoing commitment to your health goals. Please review the following plan we discussed and let me know if I can assist you in the future.   These are the goals we discussed: Goals    . Increase physical activity     Starting 07/06/2017, I will continue to exercise for 60 minutes 2-3 days per week and to play golf at least once monthly.        This is a list of the screening recommended for you and due dates:  Health Maintenance  Topic Date Due  . DTaP/Tdap/Td vaccine (1 - Tdap) 02/11/2022*  . Cologuard (Stool DNA test)  08/08/2019  . Tetanus Vaccine  02/11/2022  . Flu Shot  Completed  .  Hepatitis C: One time screening is recommended by Center for Disease Control  (CDC) for  adults born from [birth year] through 1965.   Completed  . Pneumonia vaccines  Completed  *Topic was postponed. The date shown is not the original due date.   Preventive Care for Adults  A healthy lifestyle and preventive care can promote health and wellness. Preventive health guidelines for adults include the following key practices.  . A routine yearly physical is a good way to check with your health care provider about your health and preventive screening. It is a chance to share any concerns and updates on your health and to receive a thorough exam.  . Visit your dentist for a routine exam and preventive care every 6 months. Brush your teeth twice a day and floss once a day. Good oral hygiene prevents tooth decay and gum disease.  . The frequency of eye exams is based on your age, health, family medical history, use  of contact lenses, and other factors. Follow your health care provider's recommendations for frequency of eye exams.  . Eat a healthy diet. Foods like vegetables, fruits, whole grains, low-fat dairy products, and lean protein foods contain the nutrients you need without too many calories. Decrease your  intake of foods high in solid fats, added sugars, and salt. Eat the right amount of calories for you. Get information about a proper diet from your health care provider, if necessary.  . Regular physical exercise is one of the most important things you can do for your health. Most adults should get at least 150 minutes of moderate-intensity exercise (any activity that increases your heart rate and causes you to sweat) each week. In addition, most adults need muscle-strengthening exercises on 2 or more days a week.  Silver Sneakers may be a benefit available to you. To determine eligibility, you may visit the website: www.silversneakers.com or contact program at 909-868-1999 Mon-Fri between 8AM-8PM.   . Maintain a healthy weight. The body mass index (BMI) is a screening tool to identify possible weight problems. It provides an estimate of body fat based on height and weight. Your health care provider can find your BMI and can help you achieve or maintain a healthy weight.   For adults 20 years and older: ? A BMI below 18.5 is considered underweight. ? A BMI of 18.5 to 24.9 is normal. ? A BMI of 25 to 29.9 is considered overweight. ? A BMI of 30 and above is considered obese.   . Maintain normal blood lipids and cholesterol levels by exercising and minimizing your intake of saturated fat. Eat a balanced diet with plenty of fruit and vegetables. Blood  tests for lipids and cholesterol should begin at age 51 and be repeated every 5 years. If your lipid or cholesterol levels are high, you are over 50, or you are at high risk for heart disease, you may need your cholesterol levels checked more frequently. Ongoing high lipid and cholesterol levels should be treated with medicines if diet and exercise are not working.  . If you smoke, find out from your health care provider how to quit. If you do not use tobacco, please do not start.  . If you choose to drink alcohol, please do not consume more than 2  drinks per day. One drink is considered to be 12 ounces (355 mL) of beer, 5 ounces (148 mL) of wine, or 1.5 ounces (44 mL) of liquor.  . If you are 49-67 years old, ask your health care provider if you should take aspirin to prevent strokes.  . Use sunscreen. Apply sunscreen liberally and repeatedly throughout the day. You should seek shade when your shadow is shorter than you. Protect yourself by wearing long sleeves, pants, a wide-brimmed hat, and sunglasses year round, whenever you are outdoors.  . Once a month, do a whole body skin exam, using a mirror to look at the skin on your back. Tell your health care provider of new moles, moles that have irregular borders, moles that are larger than a pencil eraser, or moles that have changed in shape or color.

## 2017-07-06 NOTE — Progress Notes (Signed)
PCP notes:   Health maintenance:  Flu vaccine - administered  Abnormal screenings:   None  Patient concerns:   None  Nurse concerns:  None  Next PCP appt:   07/08/17 @ 0930

## 2017-07-06 NOTE — Progress Notes (Signed)
Subjective:   Larry Kent is a 72 y.o. male who presents for Medicare Annual/Subsequent preventive examination.  Review of Systems:  N/A Cardiac Risk Factors include: advanced age (>14men, >64 women);male gender;obesity (BMI >30kg/m2);dyslipidemia     Objective:    Vitals: BP 120/86 (BP Location: Right Arm, Patient Position: Sitting, Cuff Size: Normal)   Pulse (!) 44   Temp 98 F (36.7 C) (Oral)   Ht 5' 7.5" (1.715 m) Comment: NO SHOES  Wt 198 lb (89.8 kg)   SpO2 97%   BMI 30.55 kg/m   Body mass index is 30.55 kg/m.  Advanced Directives 07/06/2017  Does Patient Have a Medical Advance Directive? No  Would patient like information on creating a medical advance directive? No - Patient declined    Tobacco Social History   Tobacco Use  Smoking Status Never Smoker  Smokeless Tobacco Never Used     Counseling given: No   Clinical Intake:  Pre-visit preparation completed: Yes  Pain : No/denies pain Pain Score: 0-No pain     Nutritional Status: BMI > 30  Obese Nutritional Risks: None Diabetes: No  How often do you need to have someone help you when you read instructions, pamphlets, or other written materials from your doctor or pharmacy?: 1 - Never What is the last grade level you completed in school?: Bachelor degree  Interpreter Needed?: No  Comments: pt lives with spouse Information entered by :: LPinson, LPN  Past Medical History:  Diagnosis Date  . BCC (basal cell carcinoma), face 2014   L preauricular s/p MOHS  . BPH (benign prostatic hypertrophy)    Dr. Junious Kent @ [facility]  . Carotid stenosis 99/8338   RICA 25-05%, LICA 3-97%, L vertebral occlusion, rpt 1 yr  . FHx: colon cancer   . FHx: prostate cancer   . Nephrolithiasis 09/2102  . Occlusion of right vertebral artery 05/2015   Past Surgical History:  Procedure Laterality Date  . CATARACT EXTRACTION  2008   Left  . CATARACT EXTRACTION  2013   Right (Larry Kent)  . COLONOSCOPY  ~2010   medium  int hemorrhoids, o/w WNL, rpt 5 yrs given fmhx (Dr. Oletta Lamas)  . exercise treadmill  11/2005   WNL [facility])  . HERNIA REPAIR  2008   Right  . MOHS SURGERY Left spring 2014   basal cell face  . Testicular Biopsy  2003   benign, varicocele  . US ECHOCARDIOGRAPHY  11/2005   aortic sclerosis, EF 67-34%, diastolic dysfunction   Family History  Problem Relation Age of Onset  . Cancer Father 81       prostate  . Coronary artery disease Father 78       MI  . Cancer Paternal Grandfather 76       prostate  . Stroke Mother   . Hypertension Mother   . Cancer Mother 36       colon  . Coronary artery disease Maternal Uncle   . Cancer Sister 74       breast  . Diabetes Neg Hx    Social History   Socioeconomic History  . Marital status: Married    Spouse name: None  . Number of children: None  . Years of education: None  . Highest education level: None  Social Needs  . Financial resource strain: None  . Food insecurity - worry: None  . Food insecurity - inability: None  . Transportation needs - medical: None  . Transportation needs - non-medical: None  Occupational  History  . None  Tobacco Use  . Smoking status: Never Smoker  . Smokeless tobacco: Never Used  Substance and Sexual Activity  . Alcohol use: Yes    Alcohol/week: 0.0 oz    Comment: rare  . Drug use: No  . Sexual activity: None  Other Topics Concern  . None  Social History Narrative   Caffeine: 2-3 cups coffee/day   Lives with wife and adult son, 1 dog   Occupation: Counsellor   Edu: college   Activity: golf, no regular exercise, occasionally walks with wife   Diet: good amt water, daily fruits/vegetables, fish several times a week    Outpatient Encounter Medications as of 07/06/2017  Medication Sig  . aspirin 81 MG tablet Take 81 mg by mouth daily.  Marland Kitchen atorvastatin (LIPITOR) 40 MG tablet TAKE 1 TABLET (40 MG TOTAL) BY MOUTH DAILY. (Patient taking differently: Take 40 mg by mouth every other day.  )  . ibuprofen (ADVIL,MOTRIN) 800 MG tablet Take 1 tablet (800 mg total) by mouth every 8 (eight) hours as needed.  . Multiple Vitamin (MULTIVITAMIN) tablet Take 1 tablet by mouth daily.  . [DISCONTINUED] Omega-3 Fatty Acids (FISH OIL) 1000 MG CAPS Take 1,000 mg by mouth daily.   No facility-administered encounter medications on file as of 07/06/2017.     Activities of Daily Living In your present state of health, do you have any difficulty performing the following activities: 07/06/2017  Hearing? N  Vision? N  Difficulty concentrating or making decisions? N  Walking or climbing stairs? N  Dressing or bathing? N  Doing errands, shopping? N  Preparing Food and eating ? N  Using the Toilet? N  In the past six months, have you accidently leaked urine? N  Do you have problems with loss of bowel control? N  Managing your Medications? N  Managing your Finances? N  Housekeeping or managing your Housekeeping? N  Some recent data might be hidden    Patient Care Team: Larry Bush, MD as PCP - General (Family Medicine)   Assessment:   This is a routine wellness examination for Larry Kent.  Exercise Activities and Dietary recommendations Current Exercise Habits: Home exercise routine, Type of exercise: stretching;strength training/weights, Time (Minutes): 60, Frequency (Times/Week): 3, Weekly Exercise (Minutes/Week): 180, Intensity: Moderate, Exercise limited by: None identified  Goals    . Increase physical activity     Starting 07/06/2017, I will continue to exercise for 60 minutes 2-3 days per week and to play golf at least once monthly.        Fall Risk Fall Risk  07/06/2017 07/01/2016 06/10/2015 05/23/2014 04/21/2013  Falls in the past year? No No No No No    Depression Screen PHQ 2/9 Scores 07/06/2017 07/01/2016 06/10/2015 05/23/2014  PHQ - 2 Score 0 0 0 0  PHQ- 9 Score 0 - - -    Cognitive Function MMSE - Mini Mental State Exam 07/06/2017  Orientation to time 5  Orientation to  Place 5  Registration 3  Attention/ Calculation 0  Recall 3  Language- name 2 objects 0  Language- repeat 1  Language- follow 3 step command 3  Language- read & follow direction 0  Write a sentence 0  Copy design 0  Total score 20     PLEASE NOTE: A Mini-Cog screen was completed. Maximum score is 20. A value of 0 denotes this part of Folstein MMSE was not completed or the patient failed this part of the Mini-Cog screening.  Mini-Cog Screening Orientation to Time - Max 5 pts Orientation to Place - Max 5 pts Registration - Max 3 pts Recall - Max 3 pts Language Repeat - Max 1 pts Language Follow 3 Step Command - Max 3 pts     Immunization History  Administered Date(s) Administered  . Influenza,inj,Quad PF,6+ Mos 05/23/2014, 06/10/2015, 07/01/2016, 07/06/2017  . Pneumococcal Conjugate-13 05/23/2014  . Pneumococcal Polysaccharide-23 02/12/2012  . Td 02/12/2012    Screening Tests Health Maintenance  Topic Date Due  . DTaP/Tdap/Td (1 - Tdap) 02/11/2022 (Originally 02/13/2012)  . Fecal DNA (Cologuard)  08/08/2019  . TETANUS/TDAP  02/11/2022  . INFLUENZA VACCINE  Completed  . Hepatitis C Screening  Completed  . PNA vac Low Risk Adult  Completed     Plan:     I have personally reviewed, addressed, and noted the following in the patient's chart:  A. Medical and social history B. Use of alcohol, tobacco or illicit drugs  C. Current medications and supplements D. Functional ability and status E.  Nutritional status F.  Physical activity G. Advance directives H. List of other physicians I.  Hospitalizations, surgeries, and ER visits in previous 12 months J.  [place] to include hearing, vision, cognitive, depression L. Referrals and appointments - none  In addition, I have reviewed and discussed with patient certain preventive protocols, quality metrics, and best practice recommendations. A written personalized care plan for preventive services as well as  general preventive health recommendations were provided to patient.  See attached scanned questionnaire for additional information.   Signed,   Lindell Noe, MHA, BS, LPN Health Coach

## 2017-07-07 NOTE — Progress Notes (Signed)
I reviewed health advisor's note, was available for consultation, and agree with documentation and plan.  

## 2017-07-08 ENCOUNTER — Ambulatory Visit (INDEPENDENT_AMBULATORY_CARE_PROVIDER_SITE_OTHER): Payer: Medicare Other | Admitting: Family Medicine

## 2017-07-08 ENCOUNTER — Encounter: Payer: Self-pay | Admitting: Family Medicine

## 2017-07-08 VITALS — BP 120/78 | HR 63 | Temp 97.9°F | Ht 68.0 in | Wt 196.0 lb

## 2017-07-08 DIAGNOSIS — Z7189 Other specified counseling: Secondary | ICD-10-CM

## 2017-07-08 DIAGNOSIS — R7303 Prediabetes: Secondary | ICD-10-CM | POA: Diagnosis not present

## 2017-07-08 DIAGNOSIS — I358 Other nonrheumatic aortic valve disorders: Secondary | ICD-10-CM | POA: Diagnosis not present

## 2017-07-08 DIAGNOSIS — E785 Hyperlipidemia, unspecified: Secondary | ICD-10-CM | POA: Diagnosis not present

## 2017-07-08 DIAGNOSIS — I6502 Occlusion and stenosis of left vertebral artery: Secondary | ICD-10-CM

## 2017-07-08 DIAGNOSIS — Z0000 Encounter for general adult medical examination without abnormal findings: Secondary | ICD-10-CM

## 2017-07-08 DIAGNOSIS — I6523 Occlusion and stenosis of bilateral carotid arteries: Secondary | ICD-10-CM

## 2017-07-08 MED ORDER — ATORVASTATIN CALCIUM 40 MG PO TABS: 40.0000 mg | ORAL_TABLET | ORAL | 3 refills | Status: DC

## 2017-07-08 MED ORDER — ACETAMINOPHEN 500 MG PO TABS: 500.0000 mg | ORAL_TABLET | Freq: Four times a day (QID) | ORAL | Status: DC | PRN

## 2017-07-08 NOTE — Assessment & Plan Note (Addendum)
Pending carotid US later this week.

## 2017-07-08 NOTE — Assessment & Plan Note (Signed)
Discussed avoiding added sugars, sweetened beverages.

## 2017-07-08 NOTE — Progress Notes (Signed)
BP 120/78 (BP Location: Left Arm, Patient Position: Sitting, Cuff Size: Normal)   Pulse 63   Temp 97.9 F (36.6 C) (Oral)   Ht 5\' 8"  (1.727 m)   Wt 196 lb (88.9 kg)   SpO2 94%   BMI 29.80 kg/m    CC: CPE Subjective:    Patient ID: Larry Kent, male    DOB: 04-12-1946, 72 y.o.   MRN: [id number]  HPI: Larry Kent is a 72 y.o. male presenting on 07/08/2017 for Annual Exam (Pt 2. Wants to discuss atorvastatin rx.  Since he is only taking 40 mg every other day, should it change to 20 mg tab daily?)   Saw Lesia Tuesday for medicare wellness visit. Note reviewed.   Appt tomorrow for carotid US.  Some ongoing R knee arthritis pain - treats with tylenol PRN. Receiving euflexa shots into knee. Last seen 05/2017.   Preventative: COLONOSCOPY Date: ~2010 medium int hemorrhoids, o/w WNL, rpt 5 yrs given fmhx (Dr. Oletta Lamas) - discussed. cologuard normal 2018.  Prostate screening - sees Dr Junious Silk yearly at [facility]. H/o BPH, fmhx prostate cancer. H/o kidney stones as well.  Flu shot yearly  Td - 2013 Pneumovax - 2013, prevnar 2015 shingrix - discussed Advanced directives: packet provided today. Would want wife to be HCPOA. has at home - needs notarized and will bring [place] copy.  Seat belt use discussed Sunscreen use discussed. No changing moles on skin. Sees Dr Allyson Sabal yearly. Non smoker  Alcohol - rare   Caffeine: 2-3 cups coffee/day Lives with wife and adult son, 1 dog Occupation: Counsellor - still works part time Edu: college Activity: golf, no regular exercise, occasionally walks with wife  Diet: good amt water, daily fruits/vegetables, fish several times a week  Relevant past medical, surgical, family and social history reviewed and updated as indicated. Interim medical history since our last visit reviewed. Allergies and medications reviewed and updated. Outpatient Medications Prior to Visit  Medication Sig Dispense Refill  . aspirin 81 MG tablet Take 81 mg  by mouth daily.    . Multiple Vitamin (MULTIVITAMIN) tablet Take 1 tablet by mouth daily.    Marland Kitchen atorvastatin (LIPITOR) 40 MG tablet TAKE 1 TABLET (40 MG TOTAL) BY MOUTH DAILY. (Patient taking differently: Take 40 mg by mouth every other day. ) 30 tablet 6  . ibuprofen (ADVIL,MOTRIN) 800 MG tablet Take 1 tablet (800 mg total) by mouth every 8 (eight) hours as needed. 30 tablet 0   No facility-administered medications prior to visit.      Per HPI unless specifically indicated in ROS section below Review of Systems  Constitutional: Negative for activity change, appetite change, chills, fatigue, fever and unexpected weight change.  HENT: Negative for hearing loss.   Eyes: Negative for visual disturbance.  Respiratory: Negative for cough, chest tightness, shortness of breath and wheezing.   Cardiovascular: Negative for chest pain, palpitations and leg swelling.  Gastrointestinal: Negative for abdominal distention, abdominal pain, blood in stool, constipation, diarrhea, nausea and vomiting.  Genitourinary: Negative for difficulty urinating and hematuria.  Musculoskeletal: Negative for arthralgias, myalgias and neck pain.  Skin: Negative for rash.  Neurological: Negative for dizziness, seizures, syncope and headaches.  Hematological: Negative for adenopathy. Does not bruise/bleed easily.  Psychiatric/Behavioral: Negative for dysphoric mood. The patient is not nervous/anxious.        Objective:    BP 120/78 (BP Location: Left Arm, Patient Position: Sitting, Cuff Size: Normal)   Pulse 63   Temp 97.9  F (36.6 C) (Oral)   Ht 5\' 8"  (1.727 m)   Wt 196 lb (88.9 kg)   SpO2 94%   BMI 29.80 kg/m   Wt Readings from Last 3 Encounters:  07/08/17 196 lb (88.9 kg)  07/06/17 198 lb (89.8 kg)  11/22/16 190 lb (86.2 kg)    Physical Exam  Constitutional: He is oriented to person, place, and time. He appears well-developed and well-nourished. No distress.  HENT:  Head: Normocephalic and atraumatic.    Right Ear: Hearing, tympanic membrane, external ear and ear canal normal.  Left Ear: Hearing, tympanic membrane, external ear and ear canal normal.  Nose: Nose normal.  Mouth/Throat: Uvula is midline, oropharynx is clear and moist and mucous membranes are normal. No oropharyngeal exudate, posterior oropharyngeal edema or posterior oropharyngeal erythema.  Eyes: Conjunctivae and EOM are normal. Pupils are equal, round, and reactive to light. No scleral icterus.  Neck: Normal range of motion. Neck supple. Carotid bruit is present (L>R). Erythema present. No thyromegaly present.  Cardiovascular: Normal rate, regular rhythm and intact distal pulses.  Murmur (3/6 USB) heard. Pulses:      Radial pulses are 2+ on the right side, and 2+ on the left side.  Pulmonary/Chest: Effort normal and breath sounds normal. No respiratory distress. He has no wheezes. He has no rales.  Abdominal: Soft. Bowel sounds are normal. He exhibits no distension and no mass. There is no tenderness. There is no rebound and no guarding.  Musculoskeletal: Normal range of motion. He exhibits no edema.  Lymphadenopathy:    He has no cervical adenopathy.  Neurological: He is alert and oriented to person, place, and time.  CN grossly intact, station and gait intact  Skin: Skin is warm and dry. No rash noted.  Psychiatric: He has a normal mood and affect. His behavior is normal. Judgment and thought content normal.  Nursing note and vitals reviewed.  Results for orders placed or performed in visit on 07/06/17  Hemoglobin A1c  Result Value Ref Range   Hgb A1c MFr Bld 6.1 4.6 - 6.5 %  PSA  Result Value Ref Range   PSA 1.03 0.10 - 4.00 ng/mL  Comprehensive metabolic panel  Result Value Ref Range   Sodium 139 135 - 145 mEq/L   Potassium 4.2 3.5 - 5.1 mEq/L   Chloride 105 96 - 112 mEq/L   CO2 28 19 - 32 mEq/L   Glucose, Bld 107 (H) 70 - 99 mg/dL   BUN 15 6 - 23 mg/dL   Creatinine, Ser 0.99 0.40 - 1.50 mg/dL   Total  Bilirubin 0.9 0.2 - 1.2 mg/dL   Alkaline Phosphatase 62 39 - 117 U/L   AST 21 0 - 37 U/L   ALT 21 0 - 53 U/L   Total Protein 6.7 6.0 - 8.3 g/dL   Albumin 4.0 3.5 - 5.2 g/dL   Calcium 8.9 8.4 - 10.5 mg/dL   GFR 79.02 >60.00 mL/min  Lipid panel  Result Value Ref Range   Cholesterol 144 0 - 200 mg/dL   Triglycerides 83.0 0.0 - 149.0 mg/dL   HDL 54.60 >39.00 mg/dL   VLDL 16.6 0.0 - 40.0 mg/dL   LDL Cholesterol 73 0 - 99 mg/dL   Total CHOL/HDL Ratio 3    NonHDL 89.70       Assessment & Plan:   Problem List Items Addressed This Visit    Advanced care planning/counseling discussion    Advanced directives: packet provided today. Would want wife to be  HCPOA. has at home - needs notarized and will bring [place] copy.       Aortic valve sclerosis    Chronic, stable. Aortic sclerosis by echo 2007      Relevant Medications   atorvastatin (LIPITOR) 40 MG tablet   Carotid stenosis    Pending carotid US later this week.      Relevant Medications   atorvastatin (LIPITOR) 40 MG tablet   Health maintenance examination - Primary    Preventative protocols reviewed and updated unless pt declined. Discussed healthy diet and lifestyle.       HLD (hyperlipidemia)    Chronic, stable. Continue lipitor, fish oil. He takes lipitor 40mg  QOD.       Relevant Medications   atorvastatin (LIPITOR) 40 MG tablet   Occlusion of left vertebral artery   Relevant Medications   atorvastatin (LIPITOR) 40 MG tablet   Prediabetes    Discussed avoiding added sugars, sweetened beverages.           Follow up plan: Return in about 1 year (around 07/08/2018) for annual exam, prior fasting for blood work, medicare wellness visit.  Ria Bush, MD

## 2017-07-08 NOTE — Assessment & Plan Note (Signed)
Chronic, stable. Continue lipitor, fish oil. He takes lipitor 40mg  QOD.

## 2017-07-08 NOTE — Assessment & Plan Note (Addendum)
Chronic, stable. Aortic sclerosis by echo 2007

## 2017-07-08 NOTE — Patient Instructions (Addendum)
If interested, check with pharmacy about new 2 shot shingles series (shingrix).  Bring [place] copy of your advanced directives at your convenience.  Decrease added sugar in diet.  You are doing well today  We will await carotid ultrasound.  Return as needed or in 1 year for next medicare wellness with Katha Cabal and physical with me.  Health Maintenance, Male A healthy lifestyle and preventive care is important for your health and wellness. Ask your health care provider about what schedule of regular examinations is right for you. What should I know about weight and diet? Eat a Healthy Diet  Eat plenty of vegetables, fruits, whole grains, low-fat dairy products, and lean protein.  Do not eat a lot of foods high in solid fats, added sugars, or salt.  Maintain a Healthy Weight Regular exercise can help you achieve or maintain a healthy weight. You should:  Do at least 150 minutes of exercise each week. The exercise should increase your heart rate and make you sweat (moderate-intensity exercise).  Do strength-training exercises at least twice a week.  Watch Your Levels of Cholesterol and Blood Lipids  Have your blood tested for lipids and cholesterol every 5 years starting at 72 years of age. If you are at high risk for heart disease, you should start having your blood tested when you are 72 years old. You may need to have your cholesterol levels checked more often if: ? Your lipid or cholesterol levels are high. ? You are older than 72 years of age. ? You are at high risk for heart disease.  What should I know about cancer screening? Many types of cancers can be detected early and may often be prevented. Lung Cancer  You should be screened every year for lung cancer if: ? You are a current smoker who has smoked for at least 30 years. ? You are a former smoker who has quit within the past 15 years.  Talk to your health care provider about your screening options, when you should start  screening, and how often you should be screened.  Colorectal Cancer  Routine colorectal cancer screening usually begins at 72 years of age and should be repeated every 5-10 years until you are 72 years old. You may need to be screened more often if early forms of precancerous polyps or small growths are found. Your health care provider may recommend screening at an earlier age if you have risk factors for colon cancer.  Your health care provider may recommend using home test kits to check for hidden blood in the stool.  A small camera at the end of a tube can be used to examine your colon (sigmoidoscopy or colonoscopy). This checks for the earliest forms of colorectal cancer.  Prostate and Testicular Cancer  Depending on your age and overall health, your health care provider may do certain tests to screen for prostate and testicular cancer.  Talk to your health care provider about any symptoms or concerns you have about testicular or prostate cancer.  Skin Cancer  Check your skin from head to toe regularly.  Tell your health care provider about any new moles or changes in moles, especially if: ? There is a change in a mole's size, shape, or color. ? You have a mole that is larger than a pencil eraser.  Always use sunscreen. Apply sunscreen liberally and repeat throughout the day.  Protect yourself by wearing long sleeves, pants, a wide-brimmed hat, and sunglasses when outside.  What should I  know about heart disease, diabetes, and high blood pressure?  If you are 75-55 years of age, have your blood pressure checked every 3-5 years. If you are 39 years of age or older, have your blood pressure checked every year. You should have your blood pressure measured twice-once when you are at a hospital or clinic, and once when you are not at a hospital or clinic. Record the average of the two measurements. To check your blood pressure when you are not at a hospital or clinic, you can use: ? An  automated blood pressure machine at a pharmacy. ? A home blood pressure monitor.  Talk to your health care provider about your target blood pressure.  If you are between 56-96 years old, ask your health care provider if you should take aspirin to prevent heart disease.  Have regular diabetes screenings by checking your fasting blood sugar level. ? If you are at a normal weight and have a low risk for diabetes, have this test once every three years after the age of 41. ? If you are overweight and have a high risk for diabetes, consider being tested at a younger age or more often.  A one-time screening for abdominal aortic aneurysm (AAA) by ultrasound is recommended for men aged 61-75 years who are current or former smokers. What should I know about preventing infection? Hepatitis B If you have a higher risk for hepatitis B, you should be screened for this virus. Talk with your health care provider to find out if you are at risk for hepatitis B infection. Hepatitis C Blood testing is recommended for:  Everyone born from [birth year] through 1965.  Anyone with known risk factors for hepatitis C.  Sexually Transmitted Diseases (STDs)  You should be screened each year for STDs including gonorrhea and chlamydia if: ? You are sexually active and are younger than 72 years of age. ? You are older than 72 years of age and your health care provider tells you that you are at risk for this type of infection. ? Your sexual activity has changed since you were last screened and you are at an increased risk for chlamydia or gonorrhea. Ask your health care provider if you are at risk.  Talk with your health care provider about whether you are at high risk of being infected with HIV. Your health care provider may recommend a prescription medicine to help prevent HIV infection.  What else can I do?  Schedule regular health, dental, and eye exams.  Stay current with your vaccines (immunizations).  Do not use  any tobacco products, such as cigarettes, chewing tobacco, and e-cigarettes. If you need help quitting, ask your health care provider.  Limit alcohol intake to no more than 2 drinks per day. One drink equals 12 ounces of beer, 5 ounces of wine, or 1 ounces of hard liquor.  Do not use street drugs.  Do not share needles.  Ask your health care provider for help if you need support or information about quitting drugs.  Tell your health care provider if you often feel depressed.  Tell your health care provider if you have ever been abused or do not feel safe at home. This information is not intended to replace advice given to you by your health care provider. Make sure you discuss any questions you have with your health care provider. Document Released: 12/12/2007 Document Revised: 02/12/2016 Document Reviewed: 03/19/2015 Elsevier Interactive Patient Education  [employer].

## 2017-07-08 NOTE — Assessment & Plan Note (Signed)
Advanced directives: packet provided today. Would want wife to be HCPOA. has at home - needs notarized and will bring [place] copy.

## 2017-07-08 NOTE — Assessment & Plan Note (Signed)
Preventative protocols reviewed and updated unless pt declined. Discussed healthy diet and lifestyle.  

## 2017-07-09 ENCOUNTER — Ambulatory Visit (HOSPITAL_COMMUNITY)
Admission: RE | Admit: 2017-07-09 | Discharge: 2017-07-09 | Disposition: A | Discharge status: Home / Self Care | Payer: Medicare Other | Source: Ambulatory Visit | Attending: Cardiovascular Disease | Admitting: Cardiovascular Disease

## 2017-07-09 DIAGNOSIS — I6523 Occlusion and stenosis of bilateral carotid arteries: Secondary | ICD-10-CM | POA: Diagnosis not present

## 2017-07-27 DIAGNOSIS — H04123 Dry eye syndrome of bilateral lacrimal glands: Secondary | ICD-10-CM | POA: Diagnosis not present

## 2017-07-28 DIAGNOSIS — N401 Enlarged prostate with lower urinary tract symptoms: Secondary | ICD-10-CM | POA: Diagnosis not present

## 2017-07-30 DIAGNOSIS — D485 Neoplasm of uncertain behavior of skin: Secondary | ICD-10-CM | POA: Diagnosis not present

## 2017-07-30 DIAGNOSIS — L578 Other skin changes due to chronic exposure to nonionizing radiation: Secondary | ICD-10-CM | POA: Diagnosis not present

## 2017-07-30 DIAGNOSIS — L821 Other seborrheic keratosis: Secondary | ICD-10-CM | POA: Diagnosis not present

## 2017-07-30 DIAGNOSIS — L570 Actinic keratosis: Secondary | ICD-10-CM | POA: Diagnosis not present

## 2017-07-30 DIAGNOSIS — D225 Melanocytic nevi of trunk: Secondary | ICD-10-CM | POA: Diagnosis not present

## 2017-09-07 DIAGNOSIS — M722 Plantar fascial fibromatosis: Secondary | ICD-10-CM | POA: Diagnosis not present

## 2017-10-06 DIAGNOSIS — L570 Actinic keratosis: Secondary | ICD-10-CM | POA: Diagnosis not present

## 2017-11-01 ENCOUNTER — Ambulatory Visit: Payer: Self-pay

## 2017-11-01 NOTE — Telephone Encounter (Signed)
Pt. Reports he has had numbness to his left pinky and ring fingers for "a long time, but it would come and go." Over the past week it has "been more constant." Denies any pain, weakness. Appointment made - no availability with Dr. Danise Mina.  Reason for Disposition . [1] Numbness or tingling in one or both hands AND [2] is a chronic symptom (recurrent or ongoing AND present > 4 weeks)  Answer Assessment - Initial Assessment Questions 1. SYMPTOM: "What is the main symptom you are concerned about?" (e.g., weakness, numbness)     Numbness left hand and finger - just pinky and ring finger 2. ONSET: "When did this start?" (minutes, hours, days; while sleeping)     Started on and off 5 months 3. LAST NORMAL: "When was the last time you were normal (no symptoms)?"     6 months ago 4. PATTERN "Does this come and go, or has it been constant since it started?"  "Is it present now?"     Pretty constant 5. CARDIAC SYMPTOMS: "Have you had any of the following symptoms: chest pain, difficulty breathing, palpitations?"     No 6. NEUROLOGIC SYMPTOMS: "Have you had any of the following symptoms: headache, dizziness, vision loss, double vision, changes in speech, unsteady on your feet?"     No 7. OTHER SYMPTOMS: "Do you have any other symptoms?"     No 8. PREGNANCY: "Is there any chance you are pregnant?" "When was your last menstrual period?"     No  Protocols used: NEUROLOGIC DEFICIT-A-AH

## 2017-11-05 ENCOUNTER — Ambulatory Visit (INDEPENDENT_AMBULATORY_CARE_PROVIDER_SITE_OTHER): Payer: Medicare Other | Admitting: Family Medicine

## 2017-11-05 ENCOUNTER — Encounter: Payer: Self-pay | Admitting: Family Medicine

## 2017-11-05 VITALS — BP 132/86 | HR 64 | Temp 97.8°F | Ht 68.0 in | Wt 198.8 lb

## 2017-11-05 DIAGNOSIS — R200 Anesthesia of skin: Secondary | ICD-10-CM | POA: Diagnosis not present

## 2017-11-05 NOTE — Patient Instructions (Addendum)
I think your numbness is caused by inflammation, I would like you to try a course of over the counter naproxyn- take two tablets twice a day for 5-7 days then daily as needed  If not better in 1-2 weeks or if worse, please follow up  Neck Exercises Neck exercises can be important for many reasons:  They can help you to improve and maintain flexibility in your neck. This can be especially important as you age.  They can help to make your neck stronger. This can make movement easier.  They can reduce or prevent neck pain.  They may help your upper back.  Ask your health care provider which neck exercises would be best for you. Exercises Neck Press Repeat this exercise 10 times. Do it first thing in the morning and right before bed or as told by your health care provider. 1. Lie on your back on a firm bed or on the floor with a pillow under your head. 2. Use your neck muscles to push your head down on the pillow and straighten your spine. 3. Hold the position as well as you can. Keep your head facing up and your chin tucked. 4. Slowly count to 5 while holding this position. 5. Relax for a few seconds. Then repeat.  Isometric Strengthening Do a full set of these exercises 2 times a day or as told by your health care provider. 1. Sit in a supportive chair and place your hand on your forehead. 2. Push forward with your head and neck while pushing back with your hand. Hold for 10 seconds. 3. Relax. Then repeat the exercise 3 times. 4. Next, do thesequence again, this time putting your hand against the back of your head. Use your head and neck to push backward against the hand pressure. 5. Finally, do the same exercise on either side of your head, pushing sideways against the pressure of your hand.  Prone Head Lifts Repeat this exercise 5 times. Do this 2 times a day or as told by your health care provider. 1. Lie face-down, resting on your elbows so that your chest and upper back are  raised. 2. Start with your head facing downward, near your chest. Position your chin either on or near your chest. 3. Slowly lift your head upward. Lift until you are looking straight ahead. Then continue lifting your head as far back as you can stretch. 4. Hold your head up for 5 seconds. Then slowly lower it to your starting position.  Supine Head Lifts Repeat this exercise 8-10 times. Do this 2 times a day or as told by your health care provider. 1. Lie on your back, bending your knees to point to the ceiling and keeping your feet flat on the floor. 2. Lift your head slowly off the floor, raising your chin toward your chest. 3. Hold for 5 seconds. 4. Relax and repeat.  Scapular Retraction Repeat this exercise 5 times. Do this 2 times a day or as told by your health care provider. 1. Stand with your arms at your sides. Look straight ahead. 2. Slowly pull both shoulders backward and downward until you feel a stretch between your shoulder blades in your upper back. 3. Hold for 10-30 seconds. 4. Relax and repeat.  Contact a health care provider if:  Your neck pain or discomfort gets much worse when you do an exercise.  Your neck pain or discomfort does not improve within 2 hours after you exercise. If you have any of  these problems, stop exercising right away. Do not do the exercises again unless your health care provider says that you can. Get help right away if:  You develop sudden, severe neck pain. If this happens, stop exercising right away. Do not do the exercises again unless your health care provider says that you can. Exercises Neck Stretch  Repeat this exercise 3-5 times. 1. Do this exercise while standing or while sitting in a chair. 2. Place your feet flat on the floor, shoulder-width apart. 3. Slowly turn your head to the right. Turn it all the way to the right so you can look over your right shoulder. Do not tilt or tip your head. 4. Hold this position for 10-30  seconds. 5. Slowly turn your head to the left, to look over your left shoulder. 6. Hold this position for 10-30 seconds.  Neck Retraction Repeat this exercise 8-10 times. Do this 3-4 times a day or as told by your health care provider. 1. Do this exercise while standing or while sitting in a sturdy chair. 2. Look straight ahead. Do not bend your neck. 3. Use your fingers to push your chin backward. Do not bend your neck for this movement. Continue to face straight ahead. If you are doing the exercise properly, you will feel a slight sensation in your throat and a stretch at the back of your neck. 4. Hold the stretch for 1-2 seconds. Relax and repeat.  This information is not intended to replace advice given to you by your health care provider. Make sure you discuss any questions you have with your health care provider. Document Released: 05/27/2015 Document Revised: 11/21/2015 Document Reviewed: 12/24/2014 Elsevier Interactive Patient Education  [employer].

## 2017-11-05 NOTE — Progress Notes (Signed)
Subjective:    Patient ID: Larry Kent, male    DOB: 1945/11/12, 72 y.o.   MRN: [id number]  HPI This is a 72 yo male who presents today with numbness of his left ring and pinky finger worse for a couple of weeks, has been off and on for about a year. 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 Symptoms also with holding a cell phone to his ear. No radiation of pain into arm. No change in temperature. Numbness has been constant. Does not feel weak. Both hands feel stiff from time to time.   Has had some recent neck stiffness. Changed his pillow with improvement. Has occasional pain and stiffness in left shoulder.   Past Medical History:  Diagnosis Date  . BCC (basal cell carcinoma), face 2014   L preauricular s/p MOHS  . BPH (benign prostatic hypertrophy)    Dr. Junious Silk @ [facility]  . Carotid stenosis 42/6834   RICA 19-62%, LICA 2-29%, L vertebral occlusion, rpt 1 yr  . FHx: colon cancer   . FHx: prostate cancer   . Nephrolithiasis 09/2102  . Occlusion of right vertebral artery 05/2015   Past Surgical History:  Procedure Laterality Date  . CATARACT EXTRACTION  2008   Left  . CATARACT EXTRACTION  2013   Right ([facility])  . COLONOSCOPY  ~2010   medium int hemorrhoids, o/w WNL, rpt 5 yrs given fmhx (Dr. Oletta Lamas)  . exercise treadmill  11/2005   WNL [facility])  . HERNIA REPAIR  2008   Right  . MOHS SURGERY Left spring 2014   basal cell face  . Testicular Biopsy  2003   benign, varicocele  . US ECHOCARDIOGRAPHY  11/2005   aortic sclerosis, EF 79-89%, diastolic dysfunction   Family History  Problem Relation Age of Onset  . Cancer Father 24       prostate  . Coronary artery disease Father 21       MI  . Cancer Paternal Grandfather 25       prostate  . Stroke Mother   . Hypertension Mother   . Cancer Mother 64       colon  . Coronary artery disease Maternal Uncle   . Cancer Sister 39       breast  . Diabetes Neg Hx    Social History    Tobacco Use  . Smoking status: Never Smoker  . Smokeless tobacco: Never Used  Substance Use Topics  . Alcohol use: Yes    Alcohol/week: 0.0 oz    Comment: rare  . Drug use: No      Review of Systems Per HPI    Objective:   Physical Exam  Constitutional: He is oriented to person, place, and time. He appears well-developed and well-nourished.  HENT:  Head: Normocephalic and atraumatic.  Neck: Normal range of motion. Neck supple.  Cardiovascular: Normal rate.  Pulmonary/Chest: Effort normal.  Musculoskeletal:  Neck with full range of motion.  No point tenderness.  Left shoulder with full range of motion no tenderness to palpation.  Left hand/fingers with full range of motion, normal color, 3+ radial pulse, full strength in all fingers.  Sensation to touch and vibration intact.  Neurological: He is alert and oriented to person, place, and time.  Skin: Skin is warm and dry.  Vitals reviewed.     BP 132/86 (BP Location: Right Arm, Patient Position: Sitting, Cuff Size: Normal)   Pulse 64  Temp 97.8 F (36.6 C) (Oral)   Ht 5\' 8"  (1.727 m)   Wt 198 lb 12 oz (90.2 kg)   SpO2 95%   BMI 30.22 kg/m  Wt Readings from Last 3 Encounters:  11/05/17 198 lb 12 oz (90.2 kg)  07/08/17 196 lb (88.9 kg)  07/06/17 198 lb (89.8 kg)        Assessment & Plan:  1. Numbness of fingers - Provided written and verbal information regarding diagnosis and treatment. -Discussed possible causes with patient, will try a course of over-the-counter NSAIDs and neck exercises -Return to clinic precautions reviewed -Follow-up if no improvement in 1 to 2 weeks or sooner if symptoms worsen   Clarene Reamer, FNP-BC  [facility], [place]  11/05/2017 8:36 AM

## 2017-11-10 DIAGNOSIS — L570 Actinic keratosis: Secondary | ICD-10-CM | POA: Diagnosis not present

## 2017-11-12 ENCOUNTER — Other Ambulatory Visit: Payer: Self-pay | Admitting: Family Medicine

## 2017-12-22 DIAGNOSIS — Z85828 Personal history of other malignant neoplasm of skin: Secondary | ICD-10-CM | POA: Diagnosis not present

## 2017-12-22 DIAGNOSIS — L820 Inflamed seborrheic keratosis: Secondary | ICD-10-CM | POA: Diagnosis not present

## 2017-12-22 DIAGNOSIS — L570 Actinic keratosis: Secondary | ICD-10-CM | POA: Diagnosis not present

## 2017-12-22 DIAGNOSIS — D225 Melanocytic nevi of trunk: Secondary | ICD-10-CM | POA: Diagnosis not present

## 2017-12-22 DIAGNOSIS — L821 Other seborrheic keratosis: Secondary | ICD-10-CM | POA: Diagnosis not present

## 2018-01-03 DIAGNOSIS — M5030 Other cervical disc degeneration, unspecified cervical region: Secondary | ICD-10-CM | POA: Diagnosis not present

## 2018-01-03 DIAGNOSIS — M1711 Unilateral primary osteoarthritis, right knee: Secondary | ICD-10-CM | POA: Diagnosis not present

## 2018-01-12 DIAGNOSIS — M5412 Radiculopathy, cervical region: Secondary | ICD-10-CM | POA: Diagnosis not present

## 2018-01-20 DIAGNOSIS — M5412 Radiculopathy, cervical region: Secondary | ICD-10-CM | POA: Diagnosis not present

## 2018-01-24 DIAGNOSIS — M5412 Radiculopathy, cervical region: Secondary | ICD-10-CM | POA: Diagnosis not present

## 2018-01-31 DIAGNOSIS — M5412 Radiculopathy, cervical region: Secondary | ICD-10-CM | POA: Diagnosis not present

## 2018-02-02 DIAGNOSIS — M5412 Radiculopathy, cervical region: Secondary | ICD-10-CM | POA: Diagnosis not present

## 2018-02-07 DIAGNOSIS — M5412 Radiculopathy, cervical region: Secondary | ICD-10-CM | POA: Diagnosis not present

## 2018-02-07 DIAGNOSIS — M5030 Other cervical disc degeneration, unspecified cervical region: Secondary | ICD-10-CM | POA: Diagnosis not present

## 2018-02-08 ENCOUNTER — Encounter: Payer: Self-pay | Admitting: Family Medicine

## 2018-02-08 DIAGNOSIS — M5412 Radiculopathy, cervical region: Secondary | ICD-10-CM | POA: Insufficient documentation

## 2018-02-22 DIAGNOSIS — L570 Actinic keratosis: Secondary | ICD-10-CM | POA: Diagnosis not present

## 2018-02-22 DIAGNOSIS — D485 Neoplasm of uncertain behavior of skin: Secondary | ICD-10-CM | POA: Diagnosis not present

## 2018-02-22 DIAGNOSIS — C4401 Basal cell carcinoma of skin of lip: Secondary | ICD-10-CM | POA: Diagnosis not present

## 2018-02-22 DIAGNOSIS — L820 Inflamed seborrheic keratosis: Secondary | ICD-10-CM | POA: Diagnosis not present

## 2018-03-29 ENCOUNTER — Encounter: Payer: Self-pay | Admitting: Family Medicine

## 2018-05-29 HISTORY — PX: BASAL CELL CARCINOMA EXCISION: SHX1214

## 2018-06-14 DIAGNOSIS — C4401 Basal cell carcinoma of skin of lip: Secondary | ICD-10-CM | POA: Diagnosis not present

## 2018-07-05 ENCOUNTER — Other Ambulatory Visit: Payer: Self-pay | Admitting: Family Medicine

## 2018-07-05 DIAGNOSIS — E785 Hyperlipidemia, unspecified: Secondary | ICD-10-CM

## 2018-07-05 DIAGNOSIS — N400 Enlarged prostate without lower urinary tract symptoms: Secondary | ICD-10-CM

## 2018-07-05 DIAGNOSIS — R7303 Prediabetes: Secondary | ICD-10-CM

## 2018-07-06 ENCOUNTER — Ambulatory Visit (INDEPENDENT_AMBULATORY_CARE_PROVIDER_SITE_OTHER): Payer: Medicare Other

## 2018-07-06 VITALS — BP 122/86 | HR 59 | Temp 98.5°F | Ht 68.5 in | Wt 199.0 lb

## 2018-07-06 DIAGNOSIS — N400 Enlarged prostate without lower urinary tract symptoms: Secondary | ICD-10-CM | POA: Diagnosis not present

## 2018-07-06 DIAGNOSIS — R7303 Prediabetes: Secondary | ICD-10-CM | POA: Diagnosis not present

## 2018-07-06 DIAGNOSIS — E785 Hyperlipidemia, unspecified: Secondary | ICD-10-CM | POA: Diagnosis not present

## 2018-07-06 DIAGNOSIS — Z0000 Encounter for general adult medical examination without abnormal findings: Secondary | ICD-10-CM

## 2018-07-06 LAB — LIPID PANEL
Cholesterol: 159 mg/dL (ref 0–200)
HDL: 53.9 mg/dL (ref >39.00)
LDL Cholesterol: 88 mg/dL (ref 0–99)
NONHDL: 104.72
Total CHOL/HDL Ratio: 3
Triglycerides: 82 mg/dL (ref 0.0–149.0)
VLDL: 16.4 mg/dL (ref 0.0–40.0)

## 2018-07-06 LAB — COMPREHENSIVE METABOLIC PANEL
ALT: 20 U/L (ref 0–53)
AST: 22 U/L (ref 0–37)
Albumin: 3.9 g/dL (ref 3.5–5.2)
Alkaline Phosphatase: 70 U/L (ref 39–117)
BILIRUBIN TOTAL: 0.8 mg/dL (ref 0.2–1.2)
BUN: 17 mg/dL (ref 6–23)
CO2: 30 mEq/L (ref 19–32)
Calcium: 9.1 mg/dL (ref 8.4–10.5)
Chloride: 102 mEq/L (ref 96–112)
Creatinine, Ser: 0.98 mg/dL (ref 0.40–1.50)
GFR: 79.73 mL/min (ref >60.00)
GLUCOSE: 98 mg/dL (ref 70–99)
POTASSIUM: 4.7 meq/L (ref 3.5–5.1)
SODIUM: 137 meq/L (ref 135–145)
TOTAL PROTEIN: 6.9 g/dL (ref 6.0–8.3)

## 2018-07-06 LAB — PSA: PSA: 1.47 ng/mL (ref 0.10–4.00)

## 2018-07-06 LAB — HEMOGLOBIN A1C: HEMOGLOBIN A1C: 6 % (ref 4.6–6.5)

## 2018-07-06 NOTE — Patient Instructions (Signed)
Larry Kent , Thank you for taking time to come for your Medicare Wellness Visit. I appreciate your ongoing commitment to your health goals. Please review the following plan we discussed and let me know if I can assist you in the future.   These are the goals we discussed: Goals    . Increase physical activity     Starting 07/06/2017, I will continue to exercise for 60 minutes 1 day per week and to play golf at least once monthly.        This is a list of the screening recommended for you and due dates:  Health Maintenance  Topic Date Due  . Flu Shot  07/18/2018*  . DTaP/Tdap/Td vaccine (1 - Tdap) 02/11/2022*  . Cologuard (Stool DNA test)  08/08/2019  . Tetanus Vaccine  02/11/2022  .  Hepatitis C: One time screening is recommended by Center for Disease Control  (CDC) for  adults born from [birth year] through 1965.   Completed  . Pneumonia vaccines  Completed  *Topic was postponed. The date shown is not the original due date.   Preventive Care for Adults  A healthy lifestyle and preventive care can promote health and wellness. Preventive health guidelines for adults include the following key practices.  . A routine yearly physical is a good way to check with your health care provider about your health and preventive screening. It is a chance to share any concerns and updates on your health and to receive a thorough exam.  . Visit your dentist for a routine exam and preventive care every 6 months. Brush your teeth twice a day and floss once a day. Good oral hygiene prevents tooth decay and gum disease.  . The frequency of eye exams is based on your age, health, family medical history, use  of contact lenses, and other factors. Follow your health care provider's recommendations for frequency of eye exams.  . Eat a healthy diet. Foods like vegetables, fruits, whole grains, low-fat dairy products, and lean protein foods contain the nutrients you need without too many calories. Decrease your  intake of foods high in solid fats, added sugars, and salt. Eat the right amount of calories for you. Get information about a proper diet from your health care provider, if necessary.  . Regular physical exercise is one of the most important things you can do for your health. Most adults should get at least 150 minutes of moderate-intensity exercise (any activity that increases your heart rate and causes you to sweat) each week. In addition, most adults need muscle-strengthening exercises on 2 or more days a week.  Silver Sneakers may be a benefit available to you. To determine eligibility, you may visit the website: www.silversneakers.com or contact program at (807)201-3034 Mon-Fri between 8AM-8PM.   . Maintain a healthy weight. The body mass index (BMI) is a screening tool to identify possible weight problems. It provides an estimate of body fat based on height and weight. Your health care provider can find your BMI and can help you achieve or maintain a healthy weight.   For adults 20 years and older: ? A BMI below 18.5 is considered underweight. ? A BMI of 18.5 to 24.9 is normal. ? A BMI of 25 to 29.9 is considered overweight. ? A BMI of 30 and above is considered obese.   . Maintain normal blood lipids and cholesterol levels by exercising and minimizing your intake of saturated fat. Eat a balanced diet with plenty of fruit and vegetables. Blood  tests for lipids and cholesterol should begin at age 51 and be repeated every 5 years. If your lipid or cholesterol levels are high, you are over 50, or you are at high risk for heart disease, you may need your cholesterol levels checked more frequently. Ongoing high lipid and cholesterol levels should be treated with medicines if diet and exercise are not working.  . If you smoke, find out from your health care provider how to quit. If you do not use tobacco, please do not start.  . If you choose to drink alcohol, please do not consume more than 2  drinks per day. One drink is considered to be 12 ounces (355 mL) of beer, 5 ounces (148 mL) of wine, or 1.5 ounces (44 mL) of liquor.  . If you are 67-50 years old, ask your health care provider if you should take aspirin to prevent strokes.  . Use sunscreen. Apply sunscreen liberally and repeatedly throughout the day. You should seek shade when your shadow is shorter than you. Protect yourself by wearing long sleeves, pants, a wide-brimmed hat, and sunglasses year round, whenever you are outdoors.  . Once a month, do a whole body skin exam, using a mirror to look at the skin on your back. Tell your health care provider of new moles, moles that have irregular borders, moles that are larger than a pencil eraser, or moles that have changed in shape or color.

## 2018-07-06 NOTE — Progress Notes (Signed)
Subjective:   Larry Kent is a 73 y.o. male who presents for Medicare Annual/Subsequent preventive examination.  Review of Systems:  N/A Cardiac Risk Factors include: advanced age (>46men, >71 women);male gender     Objective:    Vitals: BP 122/86 (BP Location: Right Arm, Patient Position: Sitting, Cuff Size: Normal)   Pulse (!) 59   Temp 98.5 F (36.9 C) (Oral)   Ht 5' 8.5" (1.74 m) Comment: boots  Wt 199 lb (90.3 kg)   SpO2 100%   BMI 29.82 kg/m   Body mass index is 29.82 kg/m.  Advanced Directives 07/06/2018 07/06/2017  Does Patient Have a Medical Advance Directive? No No  Would patient like information on creating a medical advance directive? No - Patient declined No - Patient declined    Tobacco Social History   Tobacco Use  Smoking Status Never Smoker  Smokeless Tobacco Never Used     Counseling given: No   Clinical Intake:  Pre-visit preparation completed: Yes  Pain : No/denies pain Pain Score: 0-No pain     Nutritional Status: BMI 25 -29 Overweight Nutritional Risks: None Diabetes: No  How often do you need to have someone help you when you read instructions, pamphlets, or other written materials from your doctor or pharmacy?: 1 - Never What is the last grade level you completed in school?: Bachelor degree  Interpreter Needed?: No  Comments: pt lives with spouse Information entered by :: LPinson, LPN  Past Medical History:  Diagnosis Date  . BCC (basal cell carcinoma), face 2014   L preauricular s/p MOHS  . BCC (basal cell carcinoma), face 02/2018   L upper lip  . BPH (benign prostatic hypertrophy)    Dr. Junious Silk @ [facility]  . Carotid stenosis 85/0277   RICA 41-28%, LICA 7-86%, L vertebral occlusion, rpt 1 yr  . FHx: colon cancer   . FHx: prostate cancer   . Nephrolithiasis 09/2102  . Occlusion of right vertebral artery 05/2015   Past Surgical History:  Procedure Laterality Date  . BASAL CELL CARCINOMA EXCISION  05/2018   lip  .  CATARACT EXTRACTION  2008   Left  . CATARACT EXTRACTION  2013   Right ([facility])  . COLONOSCOPY  ~2010   medium int hemorrhoids, o/w WNL, rpt 5 yrs given fmhx (Dr. Oletta Lamas)  . exercise treadmill  11/2005   WNL [facility])  . HERNIA REPAIR  2008   Right  . MOHS SURGERY Left spring 2014   basal cell face  . Testicular Biopsy  2003   benign, varicocele  . US ECHOCARDIOGRAPHY  11/2005   aortic sclerosis, EF 76-72%, diastolic dysfunction   Family History  Problem Relation Age of Onset  . Cancer Father 34       prostate  . Coronary artery disease Father 65       MI  . Cancer Paternal Grandfather 49       prostate  . Stroke Mother   . Hypertension Mother   . Cancer Mother 13       colon  . Coronary artery disease Maternal Uncle   . Cancer Sister 76       breast  . Diabetes Neg Hx    Social History   Socioeconomic History  . Marital status: Married    Spouse name: Not on file  . Number of children: Not on file  . Years of education: Not on file  . Highest education level: Not on file  Occupational History  .  Not on file  Social Needs  . Financial resource strain: Not on file  . Food insecurity:    Worry: Not on file    Inability: Not on file  . Transportation needs:    Medical: Not on file    Non-medical: Not on file  Tobacco Use  . Smoking status: Never Smoker  . Smokeless tobacco: Never Used  Substance and Sexual Activity  . Alcohol use: Yes    Alcohol/week: 0.0 standard drinks    Comment: rare  . Drug use: No  . Sexual activity: Not on file  Lifestyle  . Physical activity:    Days per week: Not on file    Minutes per session: Not on file  . Stress: Not on file  Relationships  . Social connections:    Talks on phone: Not on file    Gets together: Not on file    Attends religious service: Not on file    Active member of club or organization: Not on file    Attends meetings of clubs or organizations: Not on file    Relationship status: Not on file  Other  Topics Concern  . Not on file  Social History Narrative   Caffeine: 2-3 cups coffee/day   Lives with wife and adult son, 1 dog   Occupation: Counsellor   Edu: college   Activity: golf, no regular exercise, occasionally walks with wife   Diet: good amt water, daily fruits/vegetables, fish several times a week    Outpatient Encounter Medications as of 07/06/2018  Medication Sig  . atorvastatin (LIPITOR) 40 MG tablet TAKE 1 TABLET (40 MG TOTAL) BY MOUTH DAILY. (Patient taking differently: every other day. )  . Multiple Vitamin (MULTIVITAMIN) tablet Take 1 tablet by mouth daily.  . [DISCONTINUED] acetaminophen (TYLENOL) 500 MG tablet Take 1 tablet (500 mg total) by mouth every 6 (six) hours as needed.  . [DISCONTINUED] aspirin 81 MG tablet Take 81 mg by mouth daily.   No facility-administered encounter medications on file as of 07/06/2018.     Activities of Daily Living In your present state of health, do you have any difficulty performing the following activities: 07/06/2018 07/06/2017  Hearing? N N  Vision? N N  Difficulty concentrating or making decisions? N N  Walking or climbing stairs? N N  Dressing or bathing? N N  Doing errands, shopping? N N  Preparing Food and eating ? N N  Using the Toilet? N N  In the past six months, have you accidently leaked urine? N N  Do you have problems with loss of bowel control? N N  Managing your Medications? N N  Managing your Finances? N N  Housekeeping or managing your Housekeeping? N N  Some recent data might be hidden    Patient Care Team: Ria Bush, MD as PCP - General (Family Medicine)   Assessment:   This is a routine wellness examination for Nikai.   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  Increase physical activity     Starting 07/06/2017, I will continue to exercise for 60 minutes 1 day per week and to play golf at least once monthly.        Fall Risk Fall Risk  07/06/2018 07/06/2017 07/01/2016 06/10/2015 05/23/2014  Falls in the past year? 0 No No No No   Depression Screen PHQ 2/9 Scores 07/06/2018 07/06/2017 07/01/2016 06/10/2015  PHQ - 2 Score 0 0 0 0  PHQ- 9 Score 0 0 - -    Cognitive Function MMSE - Mini Mental State Exam 07/06/2018 07/06/2017  Orientation to time 5 5  Orientation to Place 5 5  Registration 3 3  Attention/ Calculation 0 0  Recall 2 3  Recall-comments unable to recall 1 of 3 words -  Language- name 2 objects 0 0  Language- repeat 1 1  Language- follow 3 step command 3 3  Language- read & follow direction 0 0  Write a sentence 0 0  Copy design 0 0  Total score 19 20     PLEASE NOTE: A Mini-Cog screen was completed. Maximum score is 20. A value of 0 denotes this part of Folstein MMSE was not completed or the patient failed this part of the Mini-Cog screening.   Mini-Cog Screening Orientation to Time - Max 5 pts Orientation to Place - Max 5 pts Registration - Max 3 pts Recall - Max 3 pts Language Repeat - Max 1 pts Language Follow 3 Step Command - Max 3 pts     Immunization History  Administered Date(s) Administered  . Influenza,inj,Quad PF,6+ Mos 05/23/2014, 06/10/2015, 07/01/2016, 07/06/2017  . Pneumococcal Conjugate-13 05/23/2014  . Pneumococcal Polysaccharide-23 02/12/2012  . Td 02/12/2012    Screening Tests Health Maintenance  Topic Date Due  . INFLUENZA VACCINE  07/18/2018 (Originally 01/27/2018)  . DTaP/Tdap/Td (1 - Tdap) 02/11/2022 (Originally 09/09/1956)  .  Fecal DNA (Cologuard)  08/08/2019  . TETANUS/TDAP  02/11/2022  . Hepatitis C Screening  Completed  . PNA vac Low Risk Adult  Completed     Plan:     I have personally reviewed, addressed, and noted the following in the patient's chart:  A. Medical and social history B. Use of alcohol, tobacco or illicit drugs  C. Current medications and supplements D. Functional ability and status E.  Nutritional status F.  Physical activity G. Advance directives H. List of other physicians I.  Hospitalizations, surgeries, and ER visits in previous 12 months J.  [place] to include hearing, vision, cognitive, depression L. Referrals and appointments - none  In addition, I have reviewed and discussed with patient certain preventive protocols, quality metrics, and best practice recommendations. A written personalized care plan for preventive services as well as general preventive health recommendations were provided to patient.  See attached scanned questionnaire for additional information.   Signed,   Lindell Noe, MHA, BS, LPN Health Coach

## 2018-07-06 NOTE — Progress Notes (Signed)
PCP notes:   Health maintenance:  Flu vaccine - patient requests vaccine at CPE  Abnormal screenings:   Mini-Cog score: 19/20 MMSE - Mini Mental State Exam 07/06/2018 07/06/2017  Orientation to time 5 5  Orientation to Place 5 5  Registration 3 3  Attention/ Calculation 0 0  Recall 2 3  Recall-comments unable to recall 1 of 3 words -  Language- name 2 objects 0 0  Language- repeat 1 1  Language- follow 3 step command 3 3  Language- read & follow direction 0 0  Write a sentence 0 0  Copy design 0 0  Total score 19 20    Patient concerns:   None  Nurse concerns:  None  Next PCP appt:   07/18/18 @ 1415

## 2018-07-07 ENCOUNTER — Ambulatory Visit: Payer: Medicare Other

## 2018-07-11 ENCOUNTER — Encounter: Payer: Medicare Other | Admitting: Family Medicine

## 2018-07-11 DIAGNOSIS — M1711 Unilateral primary osteoarthritis, right knee: Secondary | ICD-10-CM | POA: Diagnosis not present

## 2018-07-18 ENCOUNTER — Encounter: Payer: Self-pay | Admitting: Family Medicine

## 2018-07-18 ENCOUNTER — Ambulatory Visit (INDEPENDENT_AMBULATORY_CARE_PROVIDER_SITE_OTHER): Payer: Medicare Other | Admitting: Family Medicine

## 2018-07-18 VITALS — BP 120/80 | HR 71 | Temp 98.0°F | Ht 68.5 in | Wt 198.0 lb

## 2018-07-18 DIAGNOSIS — I358 Other nonrheumatic aortic valve disorders: Secondary | ICD-10-CM

## 2018-07-18 DIAGNOSIS — M1711 Unilateral primary osteoarthritis, right knee: Secondary | ICD-10-CM

## 2018-07-18 DIAGNOSIS — Z0000 Encounter for general adult medical examination without abnormal findings: Secondary | ICD-10-CM

## 2018-07-18 DIAGNOSIS — N401 Enlarged prostate with lower urinary tract symptoms: Secondary | ICD-10-CM

## 2018-07-18 DIAGNOSIS — E669 Obesity, unspecified: Secondary | ICD-10-CM

## 2018-07-18 DIAGNOSIS — I6523 Occlusion and stenosis of bilateral carotid arteries: Secondary | ICD-10-CM

## 2018-07-18 DIAGNOSIS — I6502 Occlusion and stenosis of left vertebral artery: Secondary | ICD-10-CM

## 2018-07-18 DIAGNOSIS — Z7189 Other specified counseling: Secondary | ICD-10-CM | POA: Diagnosis not present

## 2018-07-18 DIAGNOSIS — R351 Nocturia: Secondary | ICD-10-CM

## 2018-07-18 DIAGNOSIS — Z23 Encounter for immunization: Secondary | ICD-10-CM | POA: Diagnosis not present

## 2018-07-18 DIAGNOSIS — R7303 Prediabetes: Secondary | ICD-10-CM

## 2018-07-18 DIAGNOSIS — E785 Hyperlipidemia, unspecified: Secondary | ICD-10-CM | POA: Diagnosis not present

## 2018-07-18 DIAGNOSIS — E66811 Obesity, class 1: Secondary | ICD-10-CM

## 2018-07-18 MED ORDER — ATORVASTATIN CALCIUM 40 MG PO TABS: 40.0000 mg | ORAL_TABLET | ORAL | 3 refills | Status: DC

## 2018-07-18 NOTE — Assessment & Plan Note (Signed)
Followed by urology.   

## 2018-07-18 NOTE — Assessment & Plan Note (Signed)
Mild, stable. [place] from 2007 showing aortic sclerosis. Will continue to watch.

## 2018-07-18 NOTE — Assessment & Plan Note (Signed)
Preventative protocols reviewed and updated unless pt declined. Discussed healthy diet and lifestyle.  

## 2018-07-18 NOTE — Assessment & Plan Note (Signed)
Chronic, stable on lipitor 40mg  qod - continue. The 10-year ASCVD risk score Mikey Bussing DC Brooke Bonito., et al., 2013) is: 16.4%   Values used to calculate the score:     Age: 73 years     Sex: Male     Is Non-Hispanic African American: No     Diabetic: No     Tobacco smoker: No     Systolic Blood Pressure: 974 mmHg     Is BP treated: No     HDL Cholesterol: 53.9 mg/dL     Total Cholesterol: 159 mg/dL

## 2018-07-18 NOTE — Assessment & Plan Note (Signed)
Advanced directives: packet provided today. Would want wife to be HCPOA.has at home - needs notarized and will bring [place] copy.

## 2018-07-18 NOTE — Patient Instructions (Addendum)
Flu shot today If interested, check with pharmacy about new 2 shot shingles series (shingrix).  Work on Scientist, physiological.  We will refer you for carotid ultrasound.  You are doing well today. Return as needed or in 1 year for next physical.  Health Maintenance After Age 73 After age 33, you are at a higher risk for certain long-term diseases and infections as well as injuries from falls. Falls are a major cause of broken bones and head injuries in people who are older than age 76. Getting regular preventive care can help to keep you healthy and well. Preventive care includes getting regular testing and making lifestyle changes as recommended by your health care provider. Talk with your health care provider about:  Which screenings and tests you should have. A screening is a test that checks for a disease when you have no symptoms.  A diet and exercise plan that is right for you. What should I know about screenings and tests to prevent falls? Screening and testing are the best ways to find a health problem early. Early diagnosis and treatment give you the best chance of managing medical conditions that are common after age 44. Certain conditions and lifestyle choices may make you more likely to have a fall. Your health care provider may recommend:  Regular vision checks. Poor vision and conditions such as cataracts can make you more likely to have a fall. If you wear glasses, make sure to get your prescription updated if your vision changes.  Medicine review. Work with your health care provider to regularly review all of the medicines you are taking, including over-the-counter medicines. Ask your health care provider about any side effects that may make you more likely to have a fall. Tell your health care provider if any medicines that you take make you feel dizzy or sleepy.  Osteoporosis screening. Osteoporosis is a condition that causes the bones to get weaker. This can make the bones weak and  cause them to break more easily.  Blood pressure screening. Blood pressure changes and medicines to control blood pressure can make you feel dizzy.  Strength and balance checks. Your health care provider may recommend certain tests to check your strength and balance while standing, walking, or changing positions.  Foot health exam. Foot pain and numbness, as well as not wearing proper footwear, can make you more likely to have a fall.  Depression screening. You may be more likely to have a fall if you have a fear of falling, feel emotionally low, or feel unable to do activities that you used to do.  Alcohol use screening. Using too much alcohol can affect your balance and may make you more likely to have a fall. What actions can I take to lower my risk of falls? General instructions  Talk with your health care provider about your risks for falling. Tell your health care provider if: ? You fall. Be sure to tell your health care provider about all falls, even ones that seem minor. ? You feel dizzy, sleepy, or off-balance.  Take over-the-counter and prescription medicines only as told by your health care provider. These include any supplements.  Eat a healthy diet and maintain a healthy weight. A healthy diet includes low-fat dairy products, low-fat (lean) meats, and fiber from whole grains, beans, and lots of fruits and vegetables. Home safety  Remove any tripping hazards, such as rugs, cords, and clutter.  Install safety equipment such as grab bars in bathrooms and safety rails on  stairs.  Keep rooms and walkways well-lit. Activity   Follow a regular exercise program to stay fit. This will help you maintain your balance. Ask your health care provider what types of exercise are appropriate for you.  If you need a cane or walker, use it as recommended by your health care provider.  Wear supportive shoes that have nonskid soles. Lifestyle  Do not drink alcohol if your health care  provider tells you not to drink.  If you drink alcohol, limit how much you have: ? 0-1 drink a day for women. ? 0-2 drinks a day for men.  Be aware of how much alcohol is in your drink. In the [place] one drink equals one typical bottle of beer (12 oz), one-half glass of wine (5 oz), or one shot of hard liquor (1 oz).  Do not use any products that contain nicotine or tobacco, such as cigarettes and e-cigarettes. If you need help quitting, ask your health care provider. Summary  Having a healthy lifestyle and getting preventive care can help to protect your health and wellness after age 20.  Screening and testing are the best way to find a health problem early and help you avoid having a fall. Early diagnosis and treatment give you the best chance for managing medical conditions that are more common for people who are older than age 58.  Falls are a major cause of broken bones and head injuries in people who are older than age 70. Take precautions to prevent a fall at home.  Work with your health care provider to learn what changes you can make to improve your health and wellness and to prevent falls. This information is not intended to replace advice given to you by your health care provider. Make sure you discuss any questions you have with your health care provider. Document Released: 04/28/2017 Document Revised: 04/28/2017 Document Reviewed: 04/28/2017 Elsevier Interactive Patient Education  2019 [employer].

## 2018-07-18 NOTE — Assessment & Plan Note (Signed)
Anticipate murmur coming from this.

## 2018-07-18 NOTE — Assessment & Plan Note (Addendum)
Followed by [facility]. Receiving Euflexa

## 2018-07-18 NOTE — Progress Notes (Signed)
BP 120/80 (BP Location: Left Arm, Patient Position: Sitting, Cuff Size: Normal)   Pulse 71   Temp 98 F (36.7 C) (Oral)   Ht 5' 8.5" (1.74 m)   Wt 198 lb (89.8 kg)   SpO2 94%   BMI 29.67 kg/m    CC: CPE Subjective:    Patient ID: Larry Kent, male    DOB: 22-Mar-1946, 73 y.o.   MRN: [id number]  HPI: Larry Kent is a 72 y.o. male presenting on 07/18/2018 for Annual Exam (Pt 2. Pt needs new rx for atorvastatin. Takes 40 mg every other day. )   Saw Larry Kent last week for medicare wellness visit. Note reviewed.   HLD - tolerating lipitor without myalgias. Had Mohs surgery this year - lip BCC removal. Receives blue light therapy. Sees derm q6 mo.   Preventative: COLONOSCOPY Date: ~2010 medium int hemorrhoids, o/w WNL, rpt 5 yrs given fmhx (Dr. Oletta Kent) - discussed.cologuard normal 2018.  Prostate screening - sees Dr Larry Kent yearly at [facility]. H/o BPH, fmhx prostate cancer. H/o kidney stones as well.  Flu shot yearly  Td- 2013 Pneumovax - 2013, prevnar 2015 shingrix - discussed  Advanced directives: packet provided today. Would want wife to be HCPOA.has at home - needs notarized and will bring [place] copy.  Seat belt use discussed Sunscreen use discussed. No changing moles on skin. Sees Larry Kent yearly. Non smoker  Alcohol - rare  Dentist yearly  Eye exam yearly  Caffeine: 2-3 cups coffee/day Lives with wife and adult son, 1 dog Occupation: Counsellor - still works part time Edu: college Activity: golf, no regular exercise, occasionally walks with wife  Diet: good amt water, daily fruits/vegetables, fish several times a week     Relevant past medical, surgical, family and social history reviewed and updated as indicated. Interim medical history since our last visit reviewed. Allergies and medications reviewed and updated. Outpatient Medications Prior to Visit  Medication Sig Dispense Refill  . Multiple Vitamin (MULTIVITAMIN) tablet Take 1 tablet by  mouth daily.    Marland Kitchen atorvastatin (LIPITOR) 40 MG tablet TAKE 1 TABLET (40 MG TOTAL) BY MOUTH DAILY. (Patient taking differently: every other day. ) 30 tablet 8   No facility-administered medications prior to visit.      Per HPI unless specifically indicated in ROS section below Review of Systems  Constitutional: Negative for activity change, appetite change, chills, fatigue, fever and unexpected weight change.  HENT: Positive for congestion. Negative for hearing loss.   Eyes: Negative for visual disturbance.  Respiratory: Negative for cough, chest tightness, shortness of breath and wheezing.   Cardiovascular: Negative for chest pain, palpitations and leg swelling.  Gastrointestinal: Negative for abdominal distention, abdominal pain, blood in stool, constipation, diarrhea, nausea and vomiting.  Genitourinary: Negative for difficulty urinating and hematuria.  Musculoskeletal: Negative for arthralgias, myalgias and neck pain.  Skin: Negative for rash.  Neurological: Negative for dizziness, seizures, syncope and headaches.  Hematological: Negative for adenopathy. Does not bruise/bleed easily.  Psychiatric/Behavioral: Negative for dysphoric mood. The patient is not nervous/anxious.    Objective:    BP 120/80 (BP Location: Left Arm, Patient Position: Sitting, Cuff Size: Normal)   Pulse 71   Temp 98 F (36.7 C) (Oral)   Ht 5' 8.5" (1.74 m)   Wt 198 lb (89.8 kg)   SpO2 94%   BMI 29.67 kg/m   Wt Readings from Last 3 Encounters:  07/18/18 198 lb (89.8 kg)  07/06/18 199 lb (90.3 kg)  11/05/17  198 lb 12 oz (90.2 kg)    Physical Exam Vitals signs and nursing note reviewed.  Constitutional:      General: He is not in acute distress.    Appearance: He is well-developed.  HENT:     Head: Normocephalic and atraumatic.     Right Ear: Hearing, tympanic membrane, ear canal and external ear normal.     Left Ear: Hearing, tympanic membrane, ear canal and external ear normal.     Nose: Nose  normal.     Mouth/Throat:     Mouth: Mucous membranes are moist.     Pharynx: Oropharynx is clear. Uvula midline. No oropharyngeal exudate or posterior oropharyngeal erythema.  Eyes:     General: No scleral icterus.    Conjunctiva/sclera: Conjunctivae normal.     Pupils: Pupils are equal, round, and reactive to light.  Neck:     Musculoskeletal: Normal range of motion and neck supple.     Thyroid: No thyromegaly.     Vascular: No carotid bruit.  Cardiovascular:     Rate and Rhythm: Normal rate and regular rhythm.     Pulses: Normal pulses.          Radial pulses are 2+ on the right side and 2+ on the left side.     Heart sounds: Murmur (2/6 systolic best USB) present.  Pulmonary:     Effort: Pulmonary effort is normal. No respiratory distress.     Breath sounds: Normal breath sounds. No wheezing or rales.  Abdominal:     General: Bowel sounds are normal. There is no distension.     Palpations: Abdomen is soft. There is no mass.     Tenderness: There is no abdominal tenderness. There is no guarding or rebound.  Musculoskeletal: Normal range of motion.  Lymphadenopathy:     Cervical: No cervical adenopathy.  Skin:    General: Skin is warm and dry.     Findings: No rash.  Neurological:     Mental Status: He is alert and oriented to person, place, and time.     Comments: CN grossly intact, station and gait intact  Psychiatric:        Behavior: Behavior normal.        Thought Content: Thought content normal.        Judgment: Judgment normal.       Results for orders placed or performed in visit on 07/06/18  PSA  Result Value Ref Range   PSA 1.47 0.10 - 4.00 ng/mL  Hemoglobin A1c  Result Value Ref Range   Hgb A1c MFr Bld 6.0 4.6 - 6.5 %  Comprehensive metabolic panel  Result Value Ref Range   Sodium 137 135 - 145 mEq/L   Potassium 4.7 3.5 - 5.1 mEq/L   Chloride 102 96 - 112 mEq/L   CO2 30 19 - 32 mEq/L   Glucose, Bld 98 70 - 99 mg/dL   BUN 17 6 - 23 mg/dL    Creatinine, Ser 0.98 0.40 - 1.50 mg/dL   Total Bilirubin 0.8 0.2 - 1.2 mg/dL   Alkaline Phosphatase 70 39 - 117 U/L   AST 22 0 - 37 U/L   ALT 20 0 - 53 U/L   Total Protein 6.9 6.0 - 8.3 g/dL   Albumin 3.9 3.5 - 5.2 g/dL   Calcium 9.1 8.4 - 10.5 mg/dL   GFR 79.73 >60.00 mL/min  Lipid panel  Result Value Ref Range   Cholesterol 159 0 - 200 mg/dL  Triglycerides 82.0 0.0 - 149.0 mg/dL   HDL 53.90 >39.00 mg/dL   VLDL 16.4 0.0 - 40.0 mg/dL   LDL Cholesterol 88 0 - 99 mg/dL   Total CHOL/HDL Ratio 3    NonHDL 104.72    Assessment & Plan:   Problem List Items Addressed This Visit    Prediabetes    Encouraged avoiding added sugars in diet.       Osteoarthritis of right knee    Followed by [facility]. Receiving Euflexa      Occlusion of left vertebral artery    Chronic, stable.       Relevant Medications   atorvastatin (LIPITOR) 40 MG tablet   Other Relevant Orders   VAS US CAROTID   Obesity, Class I, BMI 30-34.9    Encouraged healthy diet and lifestyle changes to affect sustainable weight loss.       HLD (hyperlipidemia)    Chronic, stable on lipitor 40mg  qod - continue. The 10-year ASCVD risk score Mikey Bussing DC Brooke Bonito., et al., 2013) is: 16.4%   Values used to calculate the score:     Age: 28 years     Sex: Male     Is Non-Hispanic African American: No     Diabetic: No     Tobacco smoker: No     Systolic Blood Pressure: 737 mmHg     Is BP treated: No     HDL Cholesterol: 53.9 mg/dL     Total Cholesterol: 159 mg/dL       Relevant Medications   atorvastatin (LIPITOR) 40 MG tablet   Health maintenance examination - Primary    Preventative protocols reviewed and updated unless pt declined. Discussed healthy diet and lifestyle.       Carotid stenosis    Update carotid US.       Relevant Medications   atorvastatin (LIPITOR) 40 MG tablet   Other Relevant Orders   VAS US CAROTID   Benign prostatic hyperplasia    Followed by urology.       Aortic valve sclerosis     Anticipate murmur coming from this.       Relevant Medications   atorvastatin (LIPITOR) 40 MG tablet   Advanced care planning/counseling discussion    Advanced directives: packet provided today. Would want wife to be HCPOA.has at home - needs notarized and will bring [place] copy.        Other Visit Diagnoses    Need for influenza vaccination       Relevant Orders   Flu Vaccine QUAD 36+ mos IM (Completed)       Meds ordered this encounter  Medications  . atorvastatin (LIPITOR) 40 MG tablet    Sig: Take 1 tablet (40 mg total) by mouth every other day.    Dispense:  45 tablet    Refill:  3   Orders Placed This Encounter  Procedures  . Flu Vaccine QUAD 36+ mos IM    Follow up plan: Return in about 1 year (around 07/19/2019) for annual exam, prior fasting for blood work, medicare wellness visit.  Ria Bush, MD

## 2018-07-18 NOTE — Assessment & Plan Note (Signed)
Chronic, stable 

## 2018-07-18 NOTE — Assessment & Plan Note (Signed)
Encouraged healthy diet and lifestyle changes to affect sustainable weight loss.  

## 2018-07-18 NOTE — Assessment & Plan Note (Signed)
Update carotid US.  

## 2018-07-18 NOTE — Assessment & Plan Note (Signed)
Encouraged avoiding added sugars in diet.

## 2018-07-22 ENCOUNTER — Ambulatory Visit (HOSPITAL_COMMUNITY)
Admission: RE | Admit: 2018-07-22 | Discharge: 2018-07-22 | Disposition: A | Discharge status: Home / Self Care | Payer: Medicare Other | Source: Ambulatory Visit | Attending: Cardiovascular Disease | Admitting: Cardiovascular Disease

## 2018-07-22 DIAGNOSIS — I6502 Occlusion and stenosis of left vertebral artery: Secondary | ICD-10-CM | POA: Diagnosis not present

## 2018-07-22 DIAGNOSIS — I6523 Occlusion and stenosis of bilateral carotid arteries: Secondary | ICD-10-CM | POA: Diagnosis not present

## 2018-07-23 ENCOUNTER — Other Ambulatory Visit: Payer: Self-pay | Admitting: Family Medicine

## 2018-07-23 DIAGNOSIS — I6523 Occlusion and stenosis of bilateral carotid arteries: Secondary | ICD-10-CM

## 2018-07-28 DIAGNOSIS — M1711 Unilateral primary osteoarthritis, right knee: Secondary | ICD-10-CM | POA: Diagnosis not present

## 2018-07-31 ENCOUNTER — Other Ambulatory Visit: Payer: Self-pay | Admitting: Family Medicine

## 2018-08-02 ENCOUNTER — Ambulatory Visit: Payer: Medicare Other | Admitting: Cardiovascular Disease

## 2018-08-02 ENCOUNTER — Encounter: Payer: Self-pay | Admitting: Cardiovascular Disease

## 2018-08-02 VITALS — BP 140/78 | HR 64 | Ht 68.0 in | Wt 197.2 lb

## 2018-08-02 DIAGNOSIS — I6521 Occlusion and stenosis of right carotid artery: Secondary | ICD-10-CM | POA: Diagnosis not present

## 2018-08-02 DIAGNOSIS — R011 Cardiac murmur, unspecified: Secondary | ICD-10-CM | POA: Diagnosis not present

## 2018-08-02 DIAGNOSIS — E785 Hyperlipidemia, unspecified: Secondary | ICD-10-CM

## 2018-08-02 MED ORDER — ROSUVASTATIN CALCIUM 20 MG PO TABS: 20.0000 mg | ORAL_TABLET | Freq: Every day | ORAL | 3 refills | Status: DC

## 2018-08-02 MED ORDER — ASPIRIN EC 81 MG PO TBEC: 81.0000 mg | DELAYED_RELEASE_TABLET | Freq: Every day | ORAL | 3 refills | Status: DC

## 2018-08-02 NOTE — Progress Notes (Signed)
Cardiology Office Note   Date:  08/02/2018   ID:  Larry Kent, DOB Dec 09, 1945, MRN [id number]  PCP:  Ria Bush, MD  Cardiologist:   Kathlyn Sacramento, MD   Chief Complaint  Patient presents with  . New Patient (Initial Visit)    abnormal carotid doppler, pt denied chest pain      History of Present Illness: Larry Kent is a 73 y.o. male who was referred by Dr. Danise Mina for evaluation of carotid stenosis.  The patient has known history of hyperlipidemia but no previous cardiac history.  He is not a smoker and does not have diabetes.  There is no family history of premature coronary artery disease although his father did have myocardial infarction at the age of 28. The patient was found to have moderate 40 to 59% stenosis in the right carotid artery in 2016.  This has been followed yearly and it has progressed slowly to 60 to 79% range on most recent Doppler.  The patient never had any stroke or TIA.  No vision change. He denies any chest pain or shortness of breath.  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    Past Medical History:  Diagnosis Date  . BCC (basal cell carcinoma), face 2014   L preauricular s/p MOHS  . BCC (basal cell carcinoma), face 02/2018   L upper lip  . BPH (benign prostatic hypertrophy)    Dr. Junious Silk @ [facility]  . Carotid stenosis 03/1750   RICA 02-58%, LICA 5-27%, L vertebral occlusion, rpt 1 yr  . FHx: colon cancer   . FHx: prostate cancer   . Nephrolithiasis 09/2102  . Occlusion of right vertebral artery 05/2015    Past Surgical History:  Procedure Laterality Date  . BASAL CELL CARCINOMA EXCISION  05/2018   lip  . CATARACT EXTRACTION  2008   Left  . CATARACT EXTRACTION  2013   Right ([facility])  . COLONOSCOPY  ~2010   medium int hemorrhoids, o/w WNL, rpt 5 yrs given fmhx (Dr. Oletta Lamas)  . exercise treadmill  11/2005   WNL  [facility])  . HERNIA REPAIR  2008   Right  . MOHS SURGERY Left spring 2014   basal  cell face  . Testicular Biopsy  2003   benign, varicocele  . US ECHOCARDIOGRAPHY  11/2005   aortic sclerosis, EF 78-24%, diastolic dysfunction     Current Outpatient Medications  Medication Sig Dispense Refill  . atorvastatin (LIPITOR) 40 MG tablet Take 40 mg by mouth every other day.    . Multiple Vitamin (MULTIVITAMIN) tablet Take 1 tablet by mouth daily.     No current facility-administered medications for this visit.     Allergies:   Patient has no known allergies.    Social History:  The patient  reports that he has never smoked. He has never used smokeless tobacco. He reports current alcohol use. He reports that he does not use drugs.   Family History:  The patient's family history includes Cancer (age of onset: 28) in his sister; Cancer (age of onset: 27) in his paternal grandfather; Cancer (age of onset: 53) in his father; Cancer (age of onset: 88) in his mother; Coronary artery disease in his maternal uncle; Coronary artery disease (age of onset: 74) in his father; Hypertension in his mother; Stroke in his mother.    ROS:  Please see the history of present illness.   Otherwise, review of systems are positive for none.   All  other systems are reviewed and negative.    PHYSICAL EXAM: VS:  BP 140/78   Pulse 64   Ht 5\' 8"  (1.727 m)   Wt 197 lb 3.2 oz (89.4 kg)   BMI 29.98 kg/m  , BMI Body mass index is 29.98 kg/m. GEN: Well nourished, well developed, in no acute distress  HEENT: normal  Neck: no JVD or masses.  Left carotid bruit Cardiac: RRR; no  rubs, or gallops,no edema .  2 out of 6 systolic murmur in the aortic area. Respiratory:  clear to auscultation bilaterally, normal work of breathing GI: soft, nontender, nondistended, + BS MS: no deformity or atrophy  Skin: warm and dry, no rash Neuro:  Strength and sensation are intact Psych: euthymic mood, full affect   EKG:  EKG is ordered today. The ekg ordered today demonstrates normal sinus rhythm with possible old  inferior infarct.  No significant ST or T wave changes.   Recent Labs: 07/06/2018: ALT 20; BUN 17; Creatinine, Ser 0.98; Potassium 4.7; Sodium 137    Lipid Panel    Component Value Date/Time   CHOL 159 07/06/2018 1031   TRIG 82.0 07/06/2018 1031   HDL 53.90 07/06/2018 1031   CHOLHDL 3 07/06/2018 1031   VLDL 16.4 07/06/2018 1031   LDLCALC 88 07/06/2018 1031   LDLDIRECT 144.8 02/09/2012 0928      Wt Readings from Last 3 Encounters:  08/02/18 197 lb 3.2 oz (89.4 kg)  07/18/18 198 lb (89.8 kg)  07/06/18 199 lb (90.3 kg)       PAD Screen 08/02/2018  Previous PAD dx? No  Previous surgical procedure? No  Pain with walking? No  Feet/toe relief with dangling? No  Painful, non-healing ulcers? No  Extremities discolored? No      ASSESSMENT AND PLAN:  1.  Asymptomatic right carotid stenosis: I explained to him the natural history of carotid disease and the need for aggressive medical therapy.  No indication for revascularization given that he is asymptomatic and the stenosis is less than 80%.  We are going to continue to monitor this on a yearly basis. I advised him to start taking aspirin 81 mg once daily.  2.  Hyperlipidemia: Given his progressive carotid disease, I think we have to be more aggressive with his lipid management.  He has been taking atorvastatin 40 mg every other day mostly due to some fatigue at the end of the day.  I elected to switch him to rosuvastatin 20 mg daily.  Repeat lipid and liver profile in 6 weeks.  If LDL remains above 70, I recommend adding Zetia.  3.  Cardiac murmur: Suggestive of aortic sclerosis or mild aortic stenosis.  I requested an echocardiogram especially that his EKG has inferior Q waves.  The patient never had myocardial infarction in the past and has no anginal symptoms.    Disposition:   FU with me in 6 months  Signed,  Kathlyn Sacramento, MD  08/02/2018 9:12 AM    [place]

## 2018-08-02 NOTE — Patient Instructions (Signed)
Medication Instructions:  START Aspirin 81 mg tablet daily STOP the Atorvastatin START Rosuvastatin 20 mg daily  If you need a refill on your cardiac medications before your next appointment, please call your pharmacy.   Lab work: Your provider would like for you to return in 6 weeks to have the following labs drawn: FASTING Lipid and Liver. You do not need an appointment for the lab. Once in our office lobby there is a podium where you can sign in and ring the doorbell to alert [place] that you are here. The lab is open from 8:00 am to 4:30 pm; closed for lunch from 12:45pm-1:45pm.  If you have labs (blood work) drawn today and your tests are completely normal, you will receive your results only by: Marland Kitchen MyChart Message (if you have MyChart) OR . A paper copy in the mail If you have any lab test that is abnormal or we need to change your treatment, we will call you to review the results.  Testing/Procedures: Your physician has requested that you have an echocardiogram. Echocardiography is a painless test that uses sound waves to create images of your heart. It provides your doctor with information about the size and shape of your heart and how well your heart's chambers and valves are working. You may receive an ultrasound enhancing agent through an IV if needed to better visualize your heart during the echo.This procedure takes approximately one hour. There are no restrictions for this procedure.  Follow-Up: At [facility], you and your health needs are our priority.  As part of our continuing mission to provide you with exceptional heart care, we have created designated Provider Care Teams.  These Care Teams include your primary Cardiologist (physician) and Advanced Practice Providers (APPs -  Physician Assistants and Nurse Practitioners) who all work together to provide you with the care you need, when you need it. You will need a follow up appointment in 6 months.  Please call our office 2 months  in advance to schedule this appointment.  You may see Dr. Fletcher Anon or one of the following Advanced Practice Providers on your designated Care Team:   Kerin Ransom, PA-C Roby Lofts, [place] . Sande Rives, PA-C

## 2018-08-03 NOTE — Addendum Note (Signed)
Addended by: Vennie Homans on: 08/03/2018 11:08 AM   Modules accepted: Orders

## 2018-08-12 ENCOUNTER — Ambulatory Visit (HOSPITAL_COMMUNITY): Payer: Medicare Other | Attending: Cardiology

## 2018-08-12 DIAGNOSIS — R011 Cardiac murmur, unspecified: Secondary | ICD-10-CM | POA: Diagnosis not present

## 2018-08-20 NOTE — Progress Notes (Signed)
I reviewed health advisor's note, was available for consultation, and agree with documentation and plan.  

## 2018-09-12 ENCOUNTER — Other Ambulatory Visit: Payer: Self-pay

## 2018-09-12 DIAGNOSIS — E785 Hyperlipidemia, unspecified: Secondary | ICD-10-CM

## 2018-09-12 LAB — HEPATIC FUNCTION PANEL
ALT: 26 IU/L (ref 0–44)
AST: 32 IU/L (ref 0–40)
Albumin: 4 g/dL (ref 3.7–4.7)
Alkaline Phosphatase: 73 IU/L (ref 39–117)
Bilirubin Total: 0.7 mg/dL (ref 0.0–1.2)
Bilirubin, Direct: 0.24 mg/dL (ref 0.00–0.40)
Total Protein: 6.5 g/dL (ref 6.0–8.5)

## 2018-09-12 LAB — LIPID PANEL
Chol/HDL Ratio: 2.2 ratio (ref 0.0–5.0)
Cholesterol, Total: 128 mg/dL (ref 100–199)
HDL: 57 mg/dL (ref >39)
LDL CALC: 57 mg/dL (ref 0–99)
Triglycerides: 70 mg/dL (ref 0–149)
VLDL Cholesterol Cal: 14 mg/dL (ref 5–40)

## 2018-11-16 ENCOUNTER — Other Ambulatory Visit: Payer: Self-pay | Admitting: Family Medicine

## 2018-11-18 DIAGNOSIS — C44329 Squamous cell carcinoma of skin of other parts of face: Secondary | ICD-10-CM | POA: Diagnosis not present

## 2018-11-18 DIAGNOSIS — D485 Neoplasm of uncertain behavior of skin: Secondary | ICD-10-CM | POA: Diagnosis not present

## 2018-11-18 DIAGNOSIS — D229 Melanocytic nevi, unspecified: Secondary | ICD-10-CM | POA: Diagnosis not present

## 2018-11-18 DIAGNOSIS — L814 Other melanin hyperpigmentation: Secondary | ICD-10-CM | POA: Diagnosis not present

## 2018-11-18 DIAGNOSIS — C4442 Squamous cell carcinoma of skin of scalp and neck: Secondary | ICD-10-CM | POA: Diagnosis not present

## 2018-11-18 DIAGNOSIS — C44319 Basal cell carcinoma of skin of other parts of face: Secondary | ICD-10-CM | POA: Diagnosis not present

## 2018-12-14 DIAGNOSIS — C44319 Basal cell carcinoma of skin of other parts of face: Secondary | ICD-10-CM | POA: Diagnosis not present

## 2018-12-22 ENCOUNTER — Other Ambulatory Visit (HOSPITAL_COMMUNITY): Payer: Self-pay | Admitting: Cardiovascular Disease

## 2018-12-22 DIAGNOSIS — I6523 Occlusion and stenosis of bilateral carotid arteries: Secondary | ICD-10-CM

## 2018-12-27 DIAGNOSIS — C44329 Squamous cell carcinoma of skin of other parts of face: Secondary | ICD-10-CM | POA: Diagnosis not present

## 2019-02-21 ENCOUNTER — Encounter: Payer: Self-pay | Admitting: Cardiovascular Disease

## 2019-02-21 ENCOUNTER — Ambulatory Visit (INDEPENDENT_AMBULATORY_CARE_PROVIDER_SITE_OTHER): Payer: Medicare Other | Admitting: Cardiovascular Disease

## 2019-02-21 ENCOUNTER — Other Ambulatory Visit: Payer: Self-pay

## 2019-02-21 VITALS — BP 118/70 | HR 69 | Ht 68.0 in | Wt 191.0 lb

## 2019-02-21 DIAGNOSIS — E785 Hyperlipidemia, unspecified: Secondary | ICD-10-CM

## 2019-02-21 DIAGNOSIS — I350 Nonrheumatic aortic (valve) stenosis: Secondary | ICD-10-CM

## 2019-02-21 DIAGNOSIS — I6521 Occlusion and stenosis of right carotid artery: Secondary | ICD-10-CM | POA: Diagnosis not present

## 2019-02-21 NOTE — Patient Instructions (Addendum)
Medication Instructions:  Your physician recommends that you continue on your current medications as directed. Please refer to the Current Medication list given to you today.  If you need a refill on your cardiac medications before your next appointment, please call your pharmacy.   Lab work: None ordered If you have labs (blood work) drawn today and your tests are completely normal, you will receive your results only by: [place] (if you have MyChart) OR A paper copy in the mail If you have any lab test that is abnormal or we need to change your treatment, we will call you to review the results.  Testing/Procedures: Carotid Duplex in January 2021. We will call to schedule it.   Follow-Up: At [facility], you and your health needs are our priority.  As part of our continuing mission to provide you with exceptional heart care, we have created designated Provider Care Teams.  These Care Teams include your primary Cardiologist (physician) and Advanced Practice Providers (APPs -  Physician Assistants and Nurse Practitioners) who all work together to provide you with the care you need, when you need it. You will need a follow up appointment in 12 months.  Please call our office 2 months in advance to schedule this appointment.  You may see Kathlyn Sacramento, MD or one of the following Advanced Practice Providers on your designated Care Team:   Kerin Ransom, PA-C [address], PA-C [place], [place]

## 2019-02-21 NOTE — Progress Notes (Signed)
Cardiology Office Note   Date:  02/21/2019   ID:  Abdulrahman, Bracey May 17, 1946, MRN [id number]  PCP:  Ria Bush, MD  Cardiologist:   Kathlyn Sacramento, MD   No chief complaint on file.     History of Present Illness: TIP ATIENZA is a 73 y.o. male who is here today for a follow-up visit regarding asymptomatic carotid stenosis and mild aortic stenosis.    The patient has known history of hyperlipidemia but no previous cardiac history.  He is not a smoker and does not have diabetes.  There is no family history of premature coronary artery disease although his father did have myocardial infarction at the age of 22. The patient was found to have moderate 40 to 59% stenosis in the right carotid artery in 2016.  This has been followed yearly and it has progressed slowly to 60 to 79% range on most recent Doppler.  The patient never had any stroke or TIA.  No vision change.  During initial evaluation, he was found to have a murmur.  An echocardiogram was done which showed normal LV systolic function.  There was mild aortic stenosis with a mean gradient of 8 mmHg.  The valve was tricuspid.  He was switched from atorvastatin to rosuvastatin 20 mg daily.  He has been doing well with no recent chest pain, shortness of breath or palpitations.  No side effects with medications.  Past Medical History:  Diagnosis Date  . BCC (basal cell carcinoma), face 2014   L preauricular s/p MOHS  . BCC (basal cell carcinoma), face 02/2018   L upper lip  . BPH (benign prostatic hypertrophy)    Dr. Junious Silk @ [facility]  . Carotid stenosis 22/6333   RICA 54-56%, LICA 2-56%, L vertebral occlusion, rpt 1 yr  . FHx: colon cancer   . FHx: prostate cancer   . Nephrolithiasis 09/2102  . Occlusion of right vertebral artery 05/2015    Past Surgical History:  Procedure Laterality Date  . BASAL CELL CARCINOMA EXCISION  05/2018   lip  . CATARACT EXTRACTION  2008   Left  . CATARACT EXTRACTION  2013    Right ([facility])  . COLONOSCOPY  ~2010   medium int hemorrhoids, o/w WNL, rpt 5 yrs given fmhx (Dr. Oletta Lamas)  . exercise treadmill  11/2005   WNL [facility])  . HERNIA REPAIR  2008   Right  . MOHS SURGERY Left spring 2014   basal cell face  . Testicular Biopsy  2003   benign, varicocele  . US ECHOCARDIOGRAPHY  11/2005   aortic sclerosis, EF 38-93%, diastolic dysfunction     Current Outpatient Medications  Medication Sig Dispense Refill  . aspirin EC 81 MG tablet Take 1 tablet (81 mg total) by mouth daily. 90 tablet 3  . Multiple Vitamin (MULTIVITAMIN) tablet Take 1 tablet by mouth daily.    . tamsulosin (FLOMAX) 0.4 MG CAPS capsule Take 1 capsule (0.4 mg total) by mouth daily.    . rosuvastatin (CRESTOR) 20 MG tablet Take 1 tablet (20 mg total) by mouth daily. 90 tablet 3   No current facility-administered medications for this visit.     Allergies:   Patient has no known allergies.    Social History:  The patient  reports that he has never smoked. He has never used smokeless tobacco. He reports current alcohol use. He reports that he does not use drugs.   Family History:  The patient's family history includes Cancer (age of  onset: 63) in his sister; Cancer (age of onset: 22) in his paternal grandfather; Cancer (age of onset: 41) in his father; Cancer (age of onset: 73) in his mother; Coronary artery disease in his maternal uncle; Coronary artery disease (age of onset: 35) in his father; Hypertension in his mother; Stroke in his mother.    ROS:  Please see the history of present illness.   Otherwise, review of systems are positive for none.   All other systems are reviewed and negative.    PHYSICAL EXAM: VS:  BP 118/70   Pulse 69   Ht 5\' 8"  (1.727 m)   Wt 191 lb (86.6 kg)   SpO2 98%   BMI 29.04 kg/m  , BMI Body mass index is 29.04 kg/m. GEN: Well nourished, well developed, in no acute distress  HEENT: normal  Neck: no JVD or masses.  Left carotid bruit Cardiac: RRR; no   rubs, or gallops,no edema .  2 / 6 systolic murmur in the aortic area. Respiratory:  clear to auscultation bilaterally, normal work of breathing GI: soft, nontender, nondistended, + BS MS: no deformity or atrophy  Skin: warm and dry, no rash Neuro:  Strength and sensation are intact Psych: euthymic mood, full affect   EKG:  EKG is not ordered today.  Recent Labs: 07/06/2018: BUN 17; Creatinine, Ser 0.98; Potassium 4.7; Sodium 137 09/12/2018: ALT 26    Lipid Panel    Component Value Date/Time   CHOL 128 09/12/2018 1118   TRIG 70 09/12/2018 1118   HDL 57 09/12/2018 1118   CHOLHDL 2.2 09/12/2018 1118   CHOLHDL 3 07/06/2018 1031   VLDL 16.4 07/06/2018 1031   LDLCALC 57 09/12/2018 1118   LDLDIRECT 144.8 02/09/2012 0928      Wt Readings from Last 3 Encounters:  02/21/19 191 lb (86.6 kg)  08/02/18 197 lb 3.2 oz (89.4 kg)  07/18/18 198 lb (89.8 kg)       PAD Screen 08/02/2018  Previous PAD dx? No  Previous surgical procedure? No  Pain with walking? No  Feet/toe relief with dangling? No  Painful, non-healing ulcers? No  Extremities discolored? No      ASSESSMENT AND PLAN:  1.  Asymptomatic right carotid stenosis: Continue low-dose aspirin and treatment of risk factors.  Recommend a follow-up carotid Doppler in January 2021.   2.  Hyperlipidemia: Significant improvement since he was switched to rosuvastatin 20 mg daily.  Repeat lipid profile showed improvement in LDL to 57.  Triglyceride was 70 and HDL was 57.  3.  Aortic stenosis: This was mild on echocardiogram this year.  Repeat echocardiogram in 2 to 3 years.   Disposition:   FU with me in 12 months  Signed,  Kathlyn Sacramento, MD  02/21/2019 9:46 AM    [place]

## 2019-02-24 DIAGNOSIS — Z85828 Personal history of other malignant neoplasm of skin: Secondary | ICD-10-CM | POA: Diagnosis not present

## 2019-02-24 DIAGNOSIS — L814 Other melanin hyperpigmentation: Secondary | ICD-10-CM | POA: Diagnosis not present

## 2019-02-24 DIAGNOSIS — D485 Neoplasm of uncertain behavior of skin: Secondary | ICD-10-CM | POA: Diagnosis not present

## 2019-02-24 DIAGNOSIS — L570 Actinic keratosis: Secondary | ICD-10-CM | POA: Diagnosis not present

## 2019-02-24 DIAGNOSIS — L821 Other seborrheic keratosis: Secondary | ICD-10-CM | POA: Diagnosis not present

## 2019-02-24 DIAGNOSIS — C4441 Basal cell carcinoma of skin of scalp and neck: Secondary | ICD-10-CM | POA: Diagnosis not present

## 2019-03-30 DIAGNOSIS — C4442 Squamous cell carcinoma of skin of scalp and neck: Secondary | ICD-10-CM | POA: Diagnosis not present

## 2019-06-29 DIAGNOSIS — C4441 Basal cell carcinoma of skin of scalp and neck: Secondary | ICD-10-CM | POA: Diagnosis not present

## 2019-07-12 ENCOUNTER — Other Ambulatory Visit: Payer: Self-pay | Admitting: Family Medicine

## 2019-07-12 DIAGNOSIS — E785 Hyperlipidemia, unspecified: Secondary | ICD-10-CM

## 2019-07-12 DIAGNOSIS — N401 Enlarged prostate with lower urinary tract symptoms: Secondary | ICD-10-CM

## 2019-07-12 DIAGNOSIS — R7303 Prediabetes: Secondary | ICD-10-CM

## 2019-07-14 ENCOUNTER — Other Ambulatory Visit (INDEPENDENT_AMBULATORY_CARE_PROVIDER_SITE_OTHER): Payer: Medicare Other

## 2019-07-14 ENCOUNTER — Ambulatory Visit (INDEPENDENT_AMBULATORY_CARE_PROVIDER_SITE_OTHER): Payer: Medicare Other

## 2019-07-14 ENCOUNTER — Ambulatory Visit: Payer: Medicare Other

## 2019-07-14 ENCOUNTER — Other Ambulatory Visit: Payer: Self-pay

## 2019-07-14 DIAGNOSIS — R7303 Prediabetes: Secondary | ICD-10-CM

## 2019-07-14 DIAGNOSIS — N401 Enlarged prostate with lower urinary tract symptoms: Secondary | ICD-10-CM | POA: Diagnosis not present

## 2019-07-14 DIAGNOSIS — R351 Nocturia: Secondary | ICD-10-CM

## 2019-07-14 DIAGNOSIS — E785 Hyperlipidemia, unspecified: Secondary | ICD-10-CM

## 2019-07-14 DIAGNOSIS — Z0000 Encounter for general adult medical examination without abnormal findings: Secondary | ICD-10-CM

## 2019-07-14 LAB — COMPREHENSIVE METABOLIC PANEL
ALT: 20 U/L (ref 0–53)
AST: 25 U/L (ref 0–37)
Albumin: 3.9 g/dL (ref 3.5–5.2)
Alkaline Phosphatase: 67 U/L (ref 39–117)
BUN: 16 mg/dL (ref 6–23)
CO2: 27 mEq/L (ref 19–32)
Calcium: 8.8 mg/dL (ref 8.4–10.5)
Chloride: 105 mEq/L (ref 96–112)
Creatinine, Ser: 0.91 mg/dL (ref 0.40–1.50)
GFR: 81.48 mL/min (ref >60.00)
Glucose, Bld: 94 mg/dL (ref 70–99)
Potassium: 4.4 mEq/L (ref 3.5–5.1)
Sodium: 137 mEq/L (ref 135–145)
Total Bilirubin: 0.8 mg/dL (ref 0.2–1.2)
Total Protein: 6.5 g/dL (ref 6.0–8.3)

## 2019-07-14 LAB — LIPID PANEL
Cholesterol: 126 mg/dL (ref 0–200)
HDL: 56.3 mg/dL (ref >39.00)
LDL Cholesterol: 56 mg/dL (ref 0–99)
NonHDL: 69.33
Total CHOL/HDL Ratio: 2
Triglycerides: 65 mg/dL (ref 0.0–149.0)
VLDL: 13 mg/dL (ref 0.0–40.0)

## 2019-07-14 LAB — PSA: PSA: 1.2 ng/mL (ref 0.10–4.00)

## 2019-07-14 LAB — HEMOGLOBIN A1C: Hgb A1c MFr Bld: 5.9 % (ref 4.6–6.5)

## 2019-07-14 NOTE — Patient Instructions (Signed)
[name] , Thank you for taking time to come for your Medicare Wellness Visit. I appreciate your ongoing commitment to your health goals. Please review the following plan we discussed and let me know if I can assist you in the future.   Screening recommendations/referrals: Colonoscopy: Cologuard completed 08/07/2016 Recommended yearly ophthalmology/optometry visit for glaucoma screening and checkup Recommended yearly dental visit for hygiene and checkup  Vaccinations: Influenza vaccine: will get at physical Pneumococcal vaccine: Completed series Tdap vaccine: Up to date, completed 02/12/2012 Shingles vaccine: discussed    Advanced directives: Advance directive discussed with you today. Even though you declined this today please call our office should you change your mind and we can give you the proper paperwork for you to fill out.  Conditions/risks identified: hyperlipidemia  Next appointment: 07/21/2019 @ 2:15 pm   Preventive Care 65 Years and Older, Male Preventive care refers to lifestyle choices and visits with your health care provider that can promote health and wellness. What does preventive care include?  A yearly physical exam. This is also called an annual well check.  Dental exams once or twice a year.  Routine eye exams. Ask your health care provider how often you should have your eyes checked.  Personal lifestyle choices, including:  Daily care of your teeth and gums.  Regular physical activity.  Eating a healthy diet.  Avoiding tobacco and drug use.  Limiting alcohol use.  Practicing safe sex.  Taking low doses of aspirin every day.  Taking vitamin and mineral supplements as recommended by your health care provider. What happens during an annual well check? The services and screenings done by your health care provider during your annual well check will depend on your age, overall health, lifestyle risk factors, and family history of disease. Counseling    Your health care provider may ask you questions about your:  Alcohol use.  Tobacco use.  Drug use.  Emotional well-being.  Home and relationship well-being.  Sexual activity.  Eating habits.  History of falls.  Memory and ability to understand (cognition).  Work and work Statistician. Screening  You may have the following tests or measurements:  Height, weight, and BMI.  Blood pressure.  Lipid and cholesterol levels. These may be checked every 5 years, or more frequently if you are over 39 years old.  Skin check.  Lung cancer screening. You may have this screening every year starting at age 65 if you have a 30-pack-year history of smoking and currently smoke or have quit within the past 15 years.  Fecal occult blood test (FOBT) of the stool. You may have this test every year starting at age 11.  Flexible sigmoidoscopy or colonoscopy. You may have a sigmoidoscopy every 5 years or a colonoscopy every 10 years starting at age 90.  Prostate cancer screening. Recommendations will vary depending on your family history and other risks.  Hepatitis C blood test.  Hepatitis B blood test.  Sexually transmitted disease (STD) testing.  Diabetes screening. This is done by checking your blood sugar (glucose) after you have not eaten for a while (fasting). You may have this done every 1-3 years.  Abdominal aortic aneurysm (AAA) screening. You may need this if you are a current or former smoker.  Osteoporosis. You may be screened starting at age 3 if you are at high risk. Talk with your health care provider about your test results, treatment options, and if necessary, the need for more tests. Vaccines  Your health care provider may recommend  certain vaccines, such as:  Influenza vaccine. This is recommended every year.  Tetanus, diphtheria, and acellular pertussis (Tdap, Td) vaccine. You may need a Td booster every 10 years.  Zoster vaccine. You may need this after age  82.  Pneumococcal 13-valent conjugate (PCV13) vaccine. One dose is recommended after age 39.  Pneumococcal polysaccharide (PPSV23) vaccine. One dose is recommended after age 45. Talk to your health care provider about which screenings and vaccines you need and how often you need them. This information is not intended to replace advice given to you by your health care provider. Make sure you discuss any questions you have with your health care provider. Document Released: 07/12/2015 Document Revised: 03/04/2016 Document Reviewed: 04/16/2015 Elsevier Interactive Patient Education  2017 [place] Prevention in the Home Falls can cause injuries. They can happen to people of all ages. There are many things you can do to make your home safe and to help prevent falls. What can I do on the outside of my home?  Regularly fix the edges of walkways and driveways and fix any cracks.  Remove anything that might make you trip as you walk through a door, such as a raised step or threshold.  Trim any bushes or trees on the path to your home.  Use bright outdoor lighting.  Clear any walking paths of anything that might make someone trip, such as rocks or tools.  Regularly check to see if handrails are loose or broken. Make sure that both sides of any steps have handrails.  Any raised decks and porches should have guardrails on the edges.  Have any leaves, snow, or ice cleared regularly.  Use sand or salt on walking paths during winter.  Clean up any spills in your garage right away. This includes oil or grease spills. What can I do in the bathroom?  Use night lights.  Install grab bars by the toilet and in the tub and shower. Do not use towel bars as grab bars.  Use non-skid mats or decals in the tub or shower.  If you need to sit down in the shower, use a plastic, non-slip stool.  Keep the floor dry. Clean up any water that spills on the floor as soon as it happens.  Remove  soap buildup in the tub or shower regularly.  Attach bath mats securely with double-sided non-slip rug tape.  Do not have throw rugs and other things on the floor that can make you trip. What can I do in the bedroom?  Use night lights.  Make sure that you have a light by your bed that is easy to reach.  Do not use any sheets or blankets that are too big for your bed. They should not hang down onto the floor.  Have a firm chair that has side arms. You can use this for support while you get dressed.  Do not have throw rugs and other things on the floor that can make you trip. What can I do in the kitchen?  Clean up any spills right away.  Avoid walking on wet floors.  Keep items that you use a lot in easy-to-reach places.  If you need to reach something above you, use a strong step stool that has a grab bar.  Keep electrical cords out of the way.  Do not use floor polish or wax that makes floors slippery. If you must use wax, use non-skid floor wax.  Do not have throw rugs and other  things on the floor that can make you trip. What can I do with my stairs?  Do not leave any items on the stairs.  Make sure that there are handrails on both sides of the stairs and use them. Fix handrails that are broken or loose. Make sure that handrails are as long as the stairways.  Check any carpeting to make sure that it is firmly attached to the stairs. Fix any carpet that is loose or worn.  Avoid having throw rugs at the top or bottom of the stairs. If you do have throw rugs, attach them to the floor with carpet tape.  Make sure that you have a light switch at the top of the stairs and the bottom of the stairs. If you do not have them, ask someone to add them for you. What else can I do to help prevent falls?  Wear shoes that:  Do not have high heels.  Have rubber bottoms.  Are comfortable and fit you well.  Are closed at the toe. Do not wear sandals.  If you use a  stepladder:  Make sure that it is fully opened. Do not climb a closed stepladder.  Make sure that both sides of the stepladder are locked into place.  Ask someone to hold it for you, if possible.  Clearly mark and make sure that you can see:  Any grab bars or handrails.  First and last steps.  Where the edge of each step is.  Use tools that help you move around (mobility aids) if they are needed. These include:  Canes.  Walkers.  Scooters.  Crutches.  Turn on the lights when you go into a dark area. Replace any light bulbs as soon as they burn out.  Set up your furniture so you have a clear path. Avoid moving your furniture around.  If any of your floors are uneven, fix them.  If there are any pets around you, be aware of where they are.  Review your medicines with your doctor. Some medicines can make you feel dizzy. This can increase your chance of falling. Ask your doctor what other things that you can do to help prevent falls. This information is not intended to replace advice given to you by your health care provider. Make sure you discuss any questions you have with your health care provider. Document Released: 04/11/2009 Document Revised: 11/21/2015 Document Reviewed: 07/20/2014 Elsevier Interactive Patient Education  2017 [employer].

## 2019-07-14 NOTE — Progress Notes (Signed)
Subjective:   Larry Kent is a 74 y.o. male who presents for Medicare Annual/Subsequent preventive examination.  Review of Systems: N/A   This visit is being conducted through telemedicine via telephone at the nurse health advisor's home address due to the COVID-19 pandemic. This patient has given me verbal consent via doximity to conduct this visit, patient states they are participating from their home address. Patient and myself are on the telephone call. There is no referral for this visit. Some vital signs may be absent or patient reported.    Patient identification: identified by name, DOB, and current address   Cardiac Risk Factors include: advanced age (>81men, >41 women);dyslipidemia;male gender     Objective:    Vitals: There were no vitals taken for this visit.  There is no height or weight on file to calculate BMI.  Advanced Directives 07/14/2019 07/06/2018 07/06/2017  Does Patient Have a Medical Advance Directive? No No No  Would patient like information on creating a medical advance directive? No - Patient declined No - Patient declined No - Patient declined    Tobacco Social History   Tobacco Use  Smoking Status Never Smoker  Smokeless Tobacco Never Used     Counseling given: Not Answered   Clinical Intake:  Pre-visit preparation completed: Yes  Pain : No/denies pain     Nutritional Risks: None Diabetes: No  How often do you need to have someone help you when you read instructions, pamphlets, or other written materials from your doctor or pharmacy?: 1 - Never What is the last grade level you completed in school?: Bachelors  Interpreter Needed?: No  Information entered by :: CJohnson, LPN  Past Medical History:  Diagnosis Date  . BCC (basal cell carcinoma), face 2014   L preauricular s/p MOHS  . BCC (basal cell carcinoma), face 02/2018   L upper lip  . BPH (benign prostatic hypertrophy)    Dr. Junious Silk @ [facility]  . Carotid stenosis 19/3790   RICA 24-09%, LICA 7-35%, L vertebral occlusion, rpt 1 yr  . FHx: colon cancer   . FHx: prostate cancer   . Nephrolithiasis 09/2102  . Occlusion of right vertebral artery 05/2015   Past Surgical History:  Procedure Laterality Date  . BASAL CELL CARCINOMA EXCISION  05/2018   lip  . CATARACT EXTRACTION  2008   Left  . CATARACT EXTRACTION  2013   Right ([facility])  . COLONOSCOPY  ~2010   medium int hemorrhoids, o/w WNL, rpt 5 yrs given fmhx (Dr. Oletta Lamas)  . exercise treadmill  11/2005   WNL [facility])  . HERNIA REPAIR  2008   Right  . MOHS SURGERY Left spring 2014   basal cell face  . Testicular Biopsy  2003   benign, varicocele  . US ECHOCARDIOGRAPHY  11/2005   aortic sclerosis, EF 32-99%, diastolic dysfunction   Family History  Problem Relation Age of Onset  . Cancer Father 28       prostate  . Coronary artery disease Father 43       MI  . Cancer Paternal Grandfather 51       prostate  . Stroke Mother   . Hypertension Mother   . Cancer Mother 72       colon  . Coronary artery disease Maternal Uncle   . Cancer Sister 26       breast  . Diabetes Neg Hx    Social History   Socioeconomic History  . Marital status: Married  Spouse name: Not on file  . Number of children: Not on file  . Years of education: Not on file  . Highest education level: Not on file  Occupational History  . Not on file  Tobacco Use  . Smoking status: Never Smoker  . Smokeless tobacco: Never Used  Substance and Sexual Activity  . Alcohol use: Yes    Alcohol/week: 0.0 standard drinks    Comment: rare  . Drug use: No  . Sexual activity: Not on file  Other Topics Concern  . Not on file  Social History Narrative   Caffeine: 2-3 cups coffee/day   Lives with wife and adult son, 1 dog   Occupation: Counsellor   Edu: college   Activity: golf, no regular exercise, occasionally walks with wife   Diet: good amt water, daily fruits/vegetables, fish several times a week   Social  Determinants of Health   Financial Resource Strain: Low Risk   . Difficulty of Paying Living Expenses: Not hard at all  Food Insecurity: No Food Insecurity  . Worried About Charity fundraiser in the Last Year: Never true  . Ran Out of Food in the Last Year: Never true  Transportation Needs: No Transportation Needs  . Lack of Transportation (Medical): No  . Lack of Transportation (Non-Medical): No  Physical Activity: Inactive  . Days of Exercise per Week: 0 days  . Minutes of Exercise per Session: 0 min  Stress: No Stress Concern Present  . Feeling of Stress : Not at all  Social Connections:   . Frequency of Communication with Friends and Family: Not on file  . Frequency of Social Gatherings with Friends and Family: Not on file  . Attends Religious Services: Not on file  . Active Member of Clubs or Organizations: Not on file  . Attends Archivist Meetings: Not on file  . Marital Status: Not on file    Outpatient Encounter Medications as of 07/14/2019  Medication Sig  . aspirin EC 81 MG tablet Take 1 tablet (81 mg total) by mouth daily.  . Multiple Vitamin (MULTIVITAMIN) tablet Take 1 tablet by mouth daily.  . tamsulosin (FLOMAX) 0.4 MG CAPS capsule Take 1 capsule (0.4 mg total) by mouth daily.  . rosuvastatin (CRESTOR) 20 MG tablet Take 1 tablet (20 mg total) by mouth daily.   No facility-administered encounter medications on file as of 07/14/2019.    Activities of Daily Living In your present state of health, do you have any difficulty performing the following activities: 07/14/2019  Hearing? Y  Comment ringing in ears  Vision? N  Difficulty concentrating or making decisions? N  Walking or climbing stairs? N  Dressing or bathing? N  Doing errands, shopping? N  Preparing Food and eating ? N  Using the Toilet? N  In the past six months, have you accidently leaked urine? N  Do you have problems with loss of bowel control? N  Managing your Medications? N  Managing  your Finances? N  Housekeeping or managing your Housekeeping? N  Some recent data might be hidden    Patient Care Team: Ria Bush, MD as PCP - General (Family Medicine)   Assessment:   This is a routine wellness examination for Larry Kent.  Exercise Activities and Dietary recommendations Current Exercise Habits: The patient does not participate in regular exercise at present, Exercise limited by: None identified  Goals    . Increase physical activity     Starting 07/06/2017, I will continue  to exercise for 60 minutes 1 day per week and to play golf at least once monthly.     . Patient Stated     07/14/2019, I will maintain and continue medications as prescribed.        Fall Risk Fall Risk  07/14/2019 07/06/2018 07/06/2017 07/01/2016 06/10/2015  Falls in the past year? 0 0 No No No  Number falls in past yr: 0 - - - -  Injury with Fall? 0 - - - -  Risk for fall due to : No Fall Risks - - - -  Follow up Falls evaluation completed;Falls prevention discussed - - - -    Is the patient's home free of loose throw rugs in walkways, pet beds, electrical cords, etc?   yes      Grab bars in the bathroom? no      Handrails on the stairs?   yes      Adequate lighting?   yes  Timed Get Up and Go Performed: N/A  Depression Screen PHQ 2/9 Scores 07/14/2019 07/06/2018 07/06/2017 07/01/2016  PHQ - 2 Score 0 0 0 0  PHQ- 9 Score 0 0 0 -    Cognitive Function MMSE - Mini Mental State Exam 07/14/2019 07/06/2018 07/06/2017  Orientation to time 5 5 5   Orientation to Place 5 5 5   Registration 3 3 3   Attention/ Calculation 5 0 0  Recall 3 2 3   Recall-comments - unable to recall 1 of 3 words -  Language- name 2 objects - 0 0  Language- repeat 1 1 1   Language- follow 3 step command - 3 3  Language- read & follow direction - 0 0  Write a sentence - 0 0  Copy design - 0 0  Total score - 19 20  Mini Cog  Mini-Cog screen was completed. Maximum score is 22. A value of 0 denotes this part of the MMSE was not  completed or the patient failed this part of the Mini-Cog screening.       Immunization History  Administered Date(s) Administered  . Influenza,inj,Quad PF,6+ Mos 05/23/2014, 06/10/2015, 07/01/2016, 07/06/2017, 07/18/2018  . Pneumococcal Conjugate-13 05/23/2014  . Pneumococcal Polysaccharide-23 02/12/2012  . Td 02/12/2012    Qualifies for Shingles Vaccine? Yes  Screening Tests Health Maintenance  Topic Date Due  . DTAP VACCINES (1) 11/09/1945  . INFLUENZA VACCINE  01/28/2019  . Fecal DNA (Cologuard)  08/08/2019  . DTaP/Tdap/Td (2 - Tdap) 02/11/2022  . TETANUS/TDAP  02/11/2022  . Hepatitis C Screening  Completed  . PNA vac Low Risk Adult  Completed   Cancer Screenings: Lung: Low Dose CT Chest recommended if Age 69-80 years, 30 pack-year currently smoking OR have quit w/in 15 years. Patient does not qualify. Colorectal: Cologuard completed 08/07/2016  Additional Screenings:  Hepatitis C Screening: 05/27/2015      Plan:   Patient will maintain and continue medications as prescribed.   I have personally reviewed and noted the following in the patient's chart:   . Medical and social history . Use of alcohol, tobacco or illicit drugs  . Current medications and supplements . Functional ability and status . Nutritional status . Physical activity . Advanced directives . List of other physicians . Hospitalizations, surgeries, and ER visits in previous 12 months . Vitals . Screenings to include cognitive, depression, and falls . Referrals and appointments  In addition, I have reviewed and discussed with patient certain preventive protocols, quality metrics, and best practice recommendations. A written personalized care  plan for preventive services as well as general preventive health recommendations were provided to patient.     Andrez Grime, LPN  10/21/9561

## 2019-07-14 NOTE — Progress Notes (Signed)
PCP notes:  Health Maintenance: Need flu vaccine   Abnormal Screenings: none   Patient concerns: none   Nurse concerns: none   Next PCP appt.: 07/21/2019 @ 2:15pm

## 2019-07-21 ENCOUNTER — Ambulatory Visit (INDEPENDENT_AMBULATORY_CARE_PROVIDER_SITE_OTHER): Payer: Medicare Other | Admitting: Family Medicine

## 2019-07-21 ENCOUNTER — Other Ambulatory Visit: Payer: Self-pay

## 2019-07-21 ENCOUNTER — Encounter: Payer: Self-pay | Admitting: Family Medicine

## 2019-07-21 VITALS — BP 128/62 | HR 74 | Temp 98.0°F | Ht 66.0 in | Wt 187.6 lb

## 2019-07-21 DIAGNOSIS — E785 Hyperlipidemia, unspecified: Secondary | ICD-10-CM

## 2019-07-21 DIAGNOSIS — E66811 Obesity, class 1: Secondary | ICD-10-CM

## 2019-07-21 DIAGNOSIS — Z0000 Encounter for general adult medical examination without abnormal findings: Secondary | ICD-10-CM

## 2019-07-21 DIAGNOSIS — I6523 Occlusion and stenosis of bilateral carotid arteries: Secondary | ICD-10-CM

## 2019-07-21 DIAGNOSIS — I6502 Occlusion and stenosis of left vertebral artery: Secondary | ICD-10-CM

## 2019-07-21 DIAGNOSIS — E669 Obesity, unspecified: Secondary | ICD-10-CM

## 2019-07-21 DIAGNOSIS — R7303 Prediabetes: Secondary | ICD-10-CM

## 2019-07-21 DIAGNOSIS — N401 Enlarged prostate with lower urinary tract symptoms: Secondary | ICD-10-CM

## 2019-07-21 DIAGNOSIS — Z1211 Encounter for screening for malignant neoplasm of colon: Secondary | ICD-10-CM

## 2019-07-21 DIAGNOSIS — Z23 Encounter for immunization: Secondary | ICD-10-CM

## 2019-07-21 DIAGNOSIS — Z7189 Other specified counseling: Secondary | ICD-10-CM | POA: Diagnosis not present

## 2019-07-21 DIAGNOSIS — I358 Other nonrheumatic aortic valve disorders: Secondary | ICD-10-CM

## 2019-07-21 DIAGNOSIS — R351 Nocturia: Secondary | ICD-10-CM

## 2019-07-21 NOTE — Assessment & Plan Note (Addendum)
Advanced directives: packet provided today. Would want wife to be HCPOA. Has at home - needs notarized and will bring [place] copy.Ok with CPR and temporary life support, would want family to to help decide if prolonged illness. Would be ok with feeding tube.

## 2019-07-21 NOTE — Assessment & Plan Note (Signed)
Preventative protocols reviewed and updated unless pt declined. Discussed healthy diet and lifestyle.  

## 2019-07-21 NOTE — Patient Instructions (Addendum)
Flu shot today If interested, check with pharmacy about new 2 shot shingles series (shingrix).  Work on Scientist, research (life sciences).  Touch base with Dr Tyrell Antonio office about carotid ultrasound.  You are doing well today. Return as needed or in 1 year for next physical.   [email or web address]  Health Maintenance After Age 74 After age 77, you are at a higher risk for certain long-term diseases and infections as well as injuries from falls. Falls are a major cause of broken bones and head injuries in people who are older than age 55. Getting regular preventive care can help to keep you healthy and well. Preventive care includes getting regular testing and making lifestyle changes as recommended by your health care provider. Talk with your health care provider about:  Which screenings and tests you should have. A screening is a test that checks for a disease when you have no symptoms.  A diet and exercise plan that is right for you. What should I know about screenings and tests to prevent falls? Screening and testing are the best ways to find a health problem early. Early diagnosis and treatment give you the best chance of managing medical conditions that are common after age 79. Certain conditions and lifestyle choices may make you more likely to have a fall. Your health care provider may recommend:  Regular vision checks. Poor vision and conditions such as cataracts can make you more likely to have a fall. If you wear glasses, make sure to get your prescription updated if your vision changes.  Medicine review. Work with your health care provider to regularly review all of the medicines you are taking, including over-the-counter medicines. Ask your health care provider about any side effects that may make you more likely to have a fall. Tell your health care provider if any medicines that you take make you feel dizzy or  sleepy.  Osteoporosis screening. Osteoporosis is a condition that causes the bones to get weaker. This can make the bones weak and cause them to break more easily.  Blood pressure screening. Blood pressure changes and medicines to control blood pressure can make you feel dizzy.  Strength and balance checks. Your health care provider may recommend certain tests to check your strength and balance while standing, walking, or changing positions.  Foot health exam. Foot pain and numbness, as well as not wearing proper footwear, can make you more likely to have a fall.  Depression screening. You may be more likely to have a fall if you have a fear of falling, feel emotionally low, or feel unable to do activities that you used to do.  Alcohol use screening. Using too much alcohol can affect your balance and may make you more likely to have a fall. What actions can I take to lower my risk of falls? General instructions  Talk with your health care provider about your risks for falling. Tell your health care provider if: ? You fall. Be sure to tell your health care provider about all falls, even ones that seem minor. ? You feel dizzy, sleepy, or off-balance.  Take over-the-counter and prescription medicines only as told by your health care provider. These include any supplements.  Eat a healthy diet and maintain a healthy weight. A healthy diet includes low-fat dairy products, low-fat (lean) meats, and fiber from whole grains, beans, and lots of fruits and vegetables. Home safety  Remove any tripping hazards, such as rugs, cords, and clutter.  Install safety equipment such as grab bars  in bathrooms and safety rails on stairs.  Keep rooms and walkways well-lit. Activity   Follow a regular exercise program to stay fit. This will help you maintain your balance. Ask your health care provider what types of exercise are appropriate for you.  If you need a cane or walker, use it as recommended by  your health care provider.  Wear supportive shoes that have nonskid soles. Lifestyle  Do not drink alcohol if your health care provider tells you not to drink.  If you drink alcohol, limit how much you have: ? 0-1 drink a day for women. ? 0-2 drinks a day for men.  Be aware of how much alcohol is in your drink. In the [place] one drink equals one typical bottle of beer (12 oz), one-half glass of wine (5 oz), or one shot of hard liquor (1 oz).  Do not use any products that contain nicotine or tobacco, such as cigarettes and e-cigarettes. If you need help quitting, ask your health care provider. Summary  Having a healthy lifestyle and getting preventive care can help to protect your health and wellness after age 78.  Screening and testing are the best way to find a health problem early and help you avoid having a fall. Early diagnosis and treatment give you the best chance for managing medical conditions that are more common for people who are older than age 48.  Falls are a major cause of broken bones and head injuries in people who are older than age 46. Take precautions to prevent a fall at home.  Work with your health care provider to learn what changes you can make to improve your health and wellness and to prevent falls. This information is not intended to replace advice given to you by your health care provider. Make sure you discuss any questions you have with your health care provider. Document Revised: 10/06/2018 Document Reviewed: 04/28/2017 Elsevier Patient Education  2020 [employer].

## 2019-07-21 NOTE — Progress Notes (Signed)
This visit was conducted in person.  BP 128/62 (BP Location: Left Arm, Patient Position: Sitting, Cuff Size: Normal)   Pulse 74   Temp 98 F (36.7 C) (Temporal)   Ht 5\' 6"  (1.676 m)   Wt 187 lb 9 oz (85.1 kg)   SpO2 99%   BMI 30.27 kg/m    CC: CPE Subjective:    Patient ID: Larry Kent, male    DOB: Oct 20, 1945, 74 y.o.   MRN: [id number]  HPI: Larry Kent is a 73 y.o. male presenting on 07/21/2019 for Annual Exam (Prt 2. )   Saw health advisor last week for medicare wellness visit. Note reviewed.   No exam data present    Clinical Support from 07/14/2019 in [place] [facility] Total Score  0      Fall Risk  07/14/2019 07/06/2018 07/06/2017 07/01/2016 06/10/2015  Falls in the past year? 0 0 No No No  Number falls in past yr: 0 - - - -  Injury with Fall? 0 - - - -  Risk for fall due to : No Fall Risks - - - -  Follow up Falls evaluation completed;Falls prevention discussed - - - -     Preventative: COLONOSCOPY Date: ~2010 medium int hemorrhoids, o/w WNL, rpt 5 yrs given fmhx (Dr. Oletta Lamas) - discussed.cologuard normal 2018. Discussed. Agrees to return to GI.  Prostate screening - sees Dr Junious Silk yearly at [facility]. H/o BPH on flomax, fmhx prostate cancer. H/o kidney stones as well.  Flu shotyearly Td- 2013  Pneumovax - 2013, prevnar 2015  shingrix - discussed  Advanced directives: packet provided today. Would want wife to be HCPOA. Has at home - needs notarized and will bring [place] copy.Ok with CPR and temporary life support, would want family to to help decide if prolonged illness. Would be ok with feeding tube.  Seat belt use discussed Sunscreen use discussed. No changing moles on skin. Sees derm.  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 Interim medical history since our last visit reviewed. Allergies and medications reviewed and updated. Outpatient Medications Prior to Visit  Medication Sig Dispense Refill  . aspirin EC 81 MG tablet Take 1 tablet (81 mg total) by mouth daily. 90 tablet 3  . Multiple Vitamin (MULTIVITAMIN) tablet Take 1 tablet by mouth daily.    . tamsulosin (FLOMAX) 0.4 MG CAPS capsule Take 1 capsule (0.4 mg total) by mouth daily.    . rosuvastatin (CRESTOR) 20 MG tablet Take 1 tablet (20 mg total) by mouth daily. 90 tablet 3   No facility-administered medications prior to visit.     Per HPI unless specifically indicated in ROS section below Review of Systems  Constitutional: Negative for activity change, appetite change, chills, fatigue, fever and unexpected weight change.  HENT: Negative for hearing loss.        Cold early January - ?Covid   Eyes: Negative for visual disturbance.  Respiratory: Negative for cough, chest tightness, shortness of breath and wheezing.   Cardiovascular:  Negative for chest pain, palpitations and leg swelling.  Gastrointestinal: Negative for abdominal distention, abdominal pain, blood in stool, constipation, diarrhea, nausea and vomiting.  Genitourinary: Negative for difficulty urinating and hematuria.  Musculoskeletal: Negative for arthralgias, myalgias and neck pain.  Skin: Negative for rash.  Neurological: Negative for dizziness, seizures, syncope and headaches.  Hematological: Negative for adenopathy. Does not bruise/bleed easily.  Psychiatric/Behavioral: Negative for dysphoric mood. The patient is not nervous/anxious.    Objective:    BP 128/62 (BP Location: Left Arm, Patient Position: Sitting, Cuff  Size: Normal)   Pulse 74   Temp 98 F (36.7 C) (Temporal)   Ht 5\' 6"  (1.676 m)   Wt 187 lb 9 oz (85.1 kg)   SpO2 99%   BMI 30.27 kg/m   Wt Readings from Last 3 Encounters:  07/21/19 187 lb 9 oz (85.1 kg)  02/21/19 191 lb (86.6 kg)  08/02/18 197 lb 3.2 oz (89.4 kg)    Physical Exam Vitals and nursing note reviewed.  Constitutional:      General: He is not in acute distress.    Appearance: Normal appearance. He is well-developed. He is not ill-appearing.  HENT:     Head: Normocephalic and atraumatic.     Right Ear: Hearing, tympanic membrane, ear canal and external ear normal.     Left Ear: Hearing, tympanic membrane, ear canal and external ear normal.     Mouth/Throat:     Pharynx: Uvula midline.  Eyes:     General: No scleral icterus.    Extraocular Movements: Extraocular movements intact.     Conjunctiva/sclera: Conjunctivae normal.     Pupils: Pupils are equal, round, and reactive to light.  Neck:     Thyroid: No thyromegaly or thyroid tenderness.     Vascular: No carotid bruit.  Cardiovascular:     Rate and Rhythm: Normal rate and regular rhythm.     Pulses: Normal pulses.          Radial pulses are 2+ on the right side and 2+ on the left side.     Heart sounds: Normal heart sounds. No murmur.  Pulmonary:     Effort: Pulmonary effort is normal. No respiratory distress.     Breath sounds: Normal breath sounds. No wheezing, rhonchi or rales.  Abdominal:     General: Abdomen is flat. Bowel sounds are normal. There is no distension.     Palpations: Abdomen is soft. There is no mass.     Tenderness: There is no abdominal tenderness. There is no guarding or rebound.     Hernia: No hernia is present.  Musculoskeletal:        General: Normal range of motion.     Cervical back: Normal range of motion and neck supple.     Right lower leg: No edema.     Left lower leg: No edema.  Lymphadenopathy:     Cervical: No cervical adenopathy.  Skin:    General: Skin is warm and  dry.     Findings: No rash.  Neurological:     General: No focal deficit present.     Mental Status: He is alert and oriented to person, place, and time.     Comments: CN grossly intact, station and gait intact  Psychiatric:        Mood and Affect: Mood normal.        Behavior: Behavior normal.        Thought Content: Thought content normal.  Judgment: Judgment normal.       Results for orders placed or performed in visit on 07/14/19  PSA  Result Value Ref Range   PSA 1.20 0.10 - 4.00 ng/mL  Hemoglobin A1c  Result Value Ref Range   Hgb A1c MFr Bld 5.9 4.6 - 6.5 %  Comprehensive metabolic panel  Result Value Ref Range   Sodium 137 135 - 145 mEq/L   Potassium 4.4 3.5 - 5.1 mEq/L   Chloride 105 96 - 112 mEq/L   CO2 27 19 - 32 mEq/L   Glucose, Bld 94 70 - 99 mg/dL   BUN 16 6 - 23 mg/dL   Creatinine, Ser 0.91 0.40 - 1.50 mg/dL   Total Bilirubin 0.8 0.2 - 1.2 mg/dL   Alkaline Phosphatase 67 39 - 117 U/L   AST 25 0 - 37 U/L   ALT 20 0 - 53 U/L   Total Protein 6.5 6.0 - 8.3 g/dL   Albumin 3.9 3.5 - 5.2 g/dL   GFR 81.48 >60.00 mL/min   Calcium 8.8 8.4 - 10.5 mg/dL  Lipid panel  Result Value Ref Range   Cholesterol 126 0 - 200 mg/dL   Triglycerides 65.0 0.0 - 149.0 mg/dL   HDL 56.30 >39.00 mg/dL   VLDL 13.0 0.0 - 40.0 mg/dL   LDL Cholesterol 56 0 - 99 mg/dL   Total CHOL/HDL Ratio 2    NonHDL 69.33    Assessment & Plan:  This visit occurred during the SARS-CoV-2 public health emergency.  Safety protocols were in place, including screening questions prior to the visit, additional usage of staff PPE, and extensive cleaning of exam room while observing appropriate contact time as indicated for disinfecting solutions.   Problem List Items Addressed This Visit    Prediabetes    Encouraged avoiding added sugars in diet.       Occlusion of left vertebral artery   Relevant Medications   rosuvastatin (CRESTOR) 20 MG tablet   Obesity, Class I, BMI 30-34.9    Weight loss  noted - continue healthy diet and lifestyle.       HLD (hyperlipidemia)    Chronic, improved readings since switch to crestor -continue. The ASCVD Risk score Mikey Bussing DC Jr., et al., 2013) failed to calculate for the following reasons:   The valid total cholesterol range is 130 to 320 mg/dL       Relevant Medications   rosuvastatin (CRESTOR) 20 MG tablet   Health maintenance examination - Primary    Preventative protocols reviewed and updated unless pt declined. Discussed healthy diet and lifestyle.       Carotid stenosis    Due for rpt carotid. Looks like cardiology has ordered this. I suggested he contact their office for scheduling, if unable to will let me know and I will order.       Relevant Medications   rosuvastatin (CRESTOR) 20 MG tablet   Benign prostatic hyperplasia    Chronic, stable followed by urology Junious Silk) on flomax - continue.       Aortic valve sclerosis   Relevant Medications   rosuvastatin (CRESTOR) 20 MG tablet   Advanced care planning/counseling discussion    Advanced directives: packet provided today. Would want wife to be HCPOA. Has at home - needs notarized and will bring [place] copy.Ok with CPR and temporary life support, would want family to to help decide if prolonged illness. Would be ok with feeding tube.        Other Visit Diagnoses  Need for influenza vaccination       Relevant Orders   Flu Vaccine QUAD High Dose(Fluad) (Completed)   Special screening for malignant neoplasms, colon       Relevant Orders   Ambulatory referral to Gastroenterology       Meds ordered this encounter  Medications  . rosuvastatin (CRESTOR) 20 MG tablet    Sig: Take 1 tablet (20 mg total) by mouth daily.    Dispense:  90 tablet    Refill:  3   Orders Placed This Encounter  Procedures  . Flu Vaccine QUAD High Dose(Fluad)  . Ambulatory referral to Gastroenterology    Referral Priority:   Routine    Referral Type:   Consultation    Referral Reason:    Specialty Services Required    Number of Visits Requested:   1    Patient instructions: Flu shot today If interested, check with pharmacy about new 2 shot shingles series (shingrix).  Work on Scientist, research (life sciences).  Touch base with Dr Tyrell Antonio office about carotid ultrasound.  You are doing well today. Return as needed or in 1 year for next physical.   [email or web address]  Follow up plan: Return in about 1 year (around 07/20/2020) for annual exam, prior fasting for blood work, medicare wellness visit.  Ria Bush, MD

## 2019-07-22 MED ORDER — ROSUVASTATIN CALCIUM 20 MG PO TABS: 20.0000 mg | ORAL_TABLET | Freq: Every day | ORAL | 3 refills | Status: DC

## 2019-07-22 NOTE — Assessment & Plan Note (Signed)
Weight loss noted - continue healthy diet and lifestyle.

## 2019-07-22 NOTE — Assessment & Plan Note (Signed)
Chronic, stable followed by urology Junious Silk) on flomax - continue.

## 2019-07-22 NOTE — Assessment & Plan Note (Signed)
Encouraged avoiding added sugars in diet.

## 2019-07-22 NOTE — Assessment & Plan Note (Signed)
Due for rpt carotid. Looks like cardiology has ordered this. I suggested he contact their office for scheduling, if unable to will let me know and I will order.

## 2019-07-22 NOTE — Assessment & Plan Note (Addendum)
Chronic, improved readings since switch to crestor -continue. The ASCVD Risk score Mikey Bussing DC Jr., et al., 2013) failed to calculate for the following reasons:   The valid total cholesterol range is 130 to 320 mg/dL

## 2019-08-02 DIAGNOSIS — M1711 Unilateral primary osteoarthritis, right knee: Secondary | ICD-10-CM | POA: Diagnosis not present

## 2019-08-09 DIAGNOSIS — M1711 Unilateral primary osteoarthritis, right knee: Secondary | ICD-10-CM | POA: Diagnosis not present

## 2019-08-16 DIAGNOSIS — M1711 Unilateral primary osteoarthritis, right knee: Secondary | ICD-10-CM | POA: Diagnosis not present

## 2019-08-28 HISTORY — PX: COLONOSCOPY: SHX174

## 2019-09-05 ENCOUNTER — Other Ambulatory Visit: Payer: Self-pay

## 2019-09-05 ENCOUNTER — Ambulatory Visit (AMBULATORY_SURGERY_CENTER): Payer: Self-pay | Admitting: *Deleted

## 2019-09-05 VITALS — Temp 97.3°F | Ht 66.0 in | Wt 194.0 lb

## 2019-09-05 DIAGNOSIS — Z800 Family history of malignant neoplasm of digestive organs: Secondary | ICD-10-CM

## 2019-09-05 MED ORDER — SUPREP BOWEL PREP KIT 17.5-3.13-1.6 GM/177ML PO SOLN: 1.0000 | Freq: Once | ORAL | 0 refills | Status: AC

## 2019-09-05 NOTE — Progress Notes (Signed)
No egg or soy allergy known to patient  No issues with past sedation with any surgeries  or procedures, no intubation problems  No diet pills per patient No home 02 use per patient  No blood thinners per patient  Pt denies issues with constipation  No A fib or A flutter  EMMI video sent to pt's e mail   covid test [facility] 3-19 Friday   Due to the COVID-19 pandemic we are asking patients to follow these guidelines. Please only bring one care partner. Please be aware that your care partner may wait in the car in the parking lot or if they feel like they will be too hot to wait in the car, they may wait in the lobby on the 4th floor. All care partners are required to wear a mask the entire time (we do not have any that we can provide them), they need to practice social distancing, and we will do a Covid check for all patient's and care partners when you arrive. Also we will check their temperature and your temperature. If the care partner waits in their car they need to stay in the parking lot the entire time and we will call them on their cell phone when the patient is ready for discharge so they can bring the car to the front of the building. Also all patient's will need to wear a mask into building.

## 2019-09-06 ENCOUNTER — Encounter: Payer: Self-pay | Admitting: Gastroenterology

## 2019-09-08 ENCOUNTER — Encounter: Payer: Self-pay | Admitting: Certified Nurse Midwife

## 2019-09-08 DIAGNOSIS — Z85828 Personal history of other malignant neoplasm of skin: Secondary | ICD-10-CM | POA: Diagnosis not present

## 2019-09-08 DIAGNOSIS — C44612 Basal cell carcinoma of skin of right upper limb, including shoulder: Secondary | ICD-10-CM | POA: Diagnosis not present

## 2019-09-08 DIAGNOSIS — L821 Other seborrheic keratosis: Secondary | ICD-10-CM | POA: Diagnosis not present

## 2019-09-08 DIAGNOSIS — L570 Actinic keratosis: Secondary | ICD-10-CM | POA: Diagnosis not present

## 2019-09-08 DIAGNOSIS — D485 Neoplasm of uncertain behavior of skin: Secondary | ICD-10-CM | POA: Diagnosis not present

## 2019-09-08 DIAGNOSIS — L905 Scar conditions and fibrosis of skin: Secondary | ICD-10-CM | POA: Diagnosis not present

## 2019-09-11 ENCOUNTER — Ambulatory Visit (INDEPENDENT_AMBULATORY_CARE_PROVIDER_SITE_OTHER): Payer: Medicare Other

## 2019-09-11 ENCOUNTER — Other Ambulatory Visit: Payer: Self-pay

## 2019-09-11 DIAGNOSIS — I6523 Occlusion and stenosis of bilateral carotid arteries: Secondary | ICD-10-CM | POA: Diagnosis not present

## 2019-09-14 ENCOUNTER — Other Ambulatory Visit: Payer: Self-pay | Admitting: *Deleted

## 2019-09-14 DIAGNOSIS — I6521 Occlusion and stenosis of right carotid artery: Secondary | ICD-10-CM

## 2019-09-15 ENCOUNTER — Ambulatory Visit: Payer: Medicare Other | Admitting: Family

## 2019-09-15 ENCOUNTER — Other Ambulatory Visit: Payer: Self-pay

## 2019-09-15 ENCOUNTER — Other Ambulatory Visit
Admission: RE | Admit: 2019-09-15 | Discharge: 2019-09-15 | Disposition: A | Discharge status: Home / Self Care | Payer: Medicare Other | Source: Ambulatory Visit | Attending: Gastroenterology | Admitting: Gastroenterology

## 2019-09-15 DIAGNOSIS — Z01812 Encounter for preprocedural laboratory examination: Secondary | ICD-10-CM | POA: Diagnosis not present

## 2019-09-15 DIAGNOSIS — Z20822 Contact with and (suspected) exposure to covid-19: Secondary | ICD-10-CM | POA: Diagnosis not present

## 2019-09-15 LAB — SARS CORONAVIRUS 2 (TAT 6-24 HRS): SARS Coronavirus 2: NEGATIVE

## 2019-09-18 ENCOUNTER — Ambulatory Visit: Payer: Medicare Other | Admitting: Family

## 2019-09-19 ENCOUNTER — Ambulatory Visit (AMBULATORY_SURGERY_CENTER): Payer: Medicare Other | Admitting: Gastroenterology

## 2019-09-19 ENCOUNTER — Other Ambulatory Visit: Payer: Self-pay

## 2019-09-19 ENCOUNTER — Encounter: Payer: Self-pay | Admitting: Gastroenterology

## 2019-09-19 VITALS — BP 114/68 | HR 58 | Temp 96.6°F | Resp 12 | Ht 66.0 in | Wt 194.0 lb

## 2019-09-19 DIAGNOSIS — D123 Benign neoplasm of transverse colon: Secondary | ICD-10-CM

## 2019-09-19 DIAGNOSIS — Z1211 Encounter for screening for malignant neoplasm of colon: Secondary | ICD-10-CM

## 2019-09-19 DIAGNOSIS — Z800 Family history of malignant neoplasm of digestive organs: Secondary | ICD-10-CM | POA: Diagnosis not present

## 2019-09-19 DIAGNOSIS — D120 Benign neoplasm of cecum: Secondary | ICD-10-CM | POA: Diagnosis not present

## 2019-09-19 MED ORDER — SODIUM CHLORIDE 0.9 % IV SOLN: 500.0000 mL | Freq: Once | INTRAVENOUS | Status: DC

## 2019-09-19 NOTE — Progress Notes (Signed)
Pt's states no medical or surgical changes since previsit or office visit.  Temp- June Vitals- Donna 

## 2019-09-19 NOTE — Patient Instructions (Signed)
HANDOUTS PROVIDED ON: POLYPS, DIVERTICULOSIS, & HEMORRHOIDS  The polyps removed today have been sent for pathology.  The results can take 1-3 weeks to receive.  When your next colonoscopy should occur will be based on the pathology results.    You may resume your previous diet and medication schedule.  Thank you for allowing [place] to care for you today!!!   YOU HAD AN ENDOSCOPIC PROCEDURE TODAY AT [facility]:   Refer to the procedure report that was given to you for any specific questions about what was found during the examination.  If the procedure report does not answer your questions, please call your gastroenterologist to clarify.  If you requested that your care partner not be given the details of your procedure findings, then the procedure report has been included in a sealed envelope for you to review at your convenience later.  YOU SHOULD EXPECT: Some feelings of bloating in the abdomen. Passage of more gas than usual.  Walking can help get rid of the air that was put into your GI tract during the procedure and reduce the bloating. If you had a lower endoscopy (such as a colonoscopy or flexible sigmoidoscopy) you may notice spotting of blood in your stool or on the toilet paper. If you underwent a bowel prep for your procedure, you may not have a normal bowel movement for a few days.  Please Note:  You might notice some irritation and congestion in your nose or some drainage.  This is from the oxygen used during your procedure.  There is no need for concern and it should clear up in a day or so.  SYMPTOMS TO REPORT IMMEDIATELY:   Following lower endoscopy (colonoscopy or flexible sigmoidoscopy):  Excessive amounts of blood in the stool  Significant tenderness or worsening of abdominal pains  Swelling of the abdomen that is new, acute  Fever of 100F or higher  For urgent or emergent issues, a gastroenterologist can be reached at any hour by calling [phone]. Do  not use MyChart messaging for urgent concerns.    DIET:  We do recommend a small meal at first, but then you may proceed to your regular diet.  Drink plenty of fluids but you should avoid alcoholic beverages for 24 hours.  ACTIVITY:  You should plan to take it easy for the rest of today and you should NOT DRIVE or use heavy machinery until tomorrow (because of the sedation medicines used during the test).    FOLLOW UP: Our staff will call the number listed on your records 48-72 hours following your procedure to check on you and address any questions or concerns that you may have regarding the information given to you following your procedure. If we do not reach you, we will leave a message.  We will attempt to reach you two times.  During this call, we will ask if you have developed any symptoms of COVID 19. If you develop any symptoms (ie: fever, flu-like symptoms, shortness of breath, cough etc.) before then, please call [phone].  If you test positive for Covid 19 in the 2 weeks post procedure, please call and report this information to [place].    If any biopsies were taken you will be contacted by phone or by letter within the next 1-3 weeks.  Please call us at [phone] if you have not heard about the biopsies in 3 weeks.    SIGNATURES/CONFIDENTIALITY: You and/or your care partner have signed paperwork which will  be entered into your electronic medical record.  These signatures attest to the fact that that the information above on your After Visit Summary has been reviewed and is understood.  Full responsibility of the confidentiality of this discharge information lies with you and/or your care-partner.

## 2019-09-19 NOTE — Progress Notes (Signed)
Called to room to assist during endoscopic procedure.  Patient ID and intended procedure confirmed with present staff. Received instructions for my participation in the procedure from the performing physician.  

## 2019-09-19 NOTE — Op Note (Signed)
[facility] Patient Name: Larry Kent Procedure Date: 09/19/2019 9:44 AM MRN: [id number] Endoscopist: Remo Lipps P. Havery Moros , MD Age: 74 Referring MD:  Date of Birth: [birth year] Gender: Male Account #: [id number] Procedure:                Colonoscopy Indications:              Screening patient at increased risk: Family history                            of 1st-degree relative with colorectal cancer                            (mother diagnosed age 56s) Medicines:                Monitored Anesthesia Care Procedure:                Pre-Anesthesia Assessment:                           - Prior to the procedure, a History and Physical                            was performed, and patient medications and                            allergies were reviewed. The patient's tolerance of                            previous anesthesia was also reviewed. The risks                            and benefits of the procedure and the sedation                            options and risks were discussed with the patient.                            All questions were answered, and informed consent                            was obtained. Prior Anticoagulants: The patient has                            taken no previous anticoagulant or antiplatelet                            agents. ASA Grade Assessment: III - A patient with                            severe systemic disease. After reviewing the risks                            and benefits, the patient was deemed in  satisfactory condition to undergo the procedure.                           After obtaining informed consent, the colonoscope                            was passed under direct vision. Throughout the                            procedure, the patient's blood pressure, pulse, and                            oxygen saturations were monitored continuously. The                            Colonoscope was introduced  through the anus and                            advanced to the the cecum, identified by                            appendiceal orifice and ileocecal valve. The                            colonoscopy was performed without difficulty. The                            patient tolerated the procedure well. The quality                            of the bowel preparation was good. The ileocecal                            valve, appendiceal orifice, and rectum were                            photographed. Scope In: 9:48:06 AM Scope Out: 10:05:43 AM Scope Withdrawal Time: 0 hours 14 minutes 17 seconds  Total Procedure Duration: 0 hours 17 minutes 37 seconds  Findings:                 The perianal and digital rectal examinations were                            normal.                           A 4 mm polyp was found in the cecum. The polyp was                            sessile. The polyp was removed with a cold snare.                            Resection and retrieval were complete.  Four sessile polyps were found in the transverse                            colon. The polyps were 3 to 4 mm in size. These                            polyps were removed with a cold snare. Resection                            and retrieval were complete.                           A few small-mouthed diverticula were found in the                            sigmoid colon.                           Internal hemorrhoids were found during retroflexion.                           The exam was otherwise without abnormality. Complications:            No immediate complications. Estimated blood loss:                            Minimal. Estimated Blood Loss:     Estimated blood loss was minimal. Impression:               - One 4 mm polyp in the cecum, removed with a cold                            snare. Resected and retrieved.                           - Four 3 to 4 mm polyps in the transverse colon,                             removed with a cold snare. Resected and retrieved.                           - Diverticulosis in the sigmoid colon.                           - Internal hemorrhoids.                           - The examination was otherwise normal. Recommendation:           - Patient has a contact number available for                            emergencies. The signs and symptoms of potential  delayed complications were discussed with the                            patient. Return to normal activities tomorrow.                            Written discharge instructions were provided to the                            patient.                           - Resume previous diet.                           - Continue present medications.                           - Await pathology results. Remo Lipps P. Havery Moros, MD 09/19/2019 10:09:49 AM This report has been signed electronically.

## 2019-09-19 NOTE — Progress Notes (Signed)
PT taken to PACU. Monitors in place. VSS. Report given to RN. 

## 2019-09-21 ENCOUNTER — Telehealth: Payer: Self-pay | Admitting: *Deleted

## 2019-09-21 ENCOUNTER — Encounter: Payer: Self-pay | Admitting: Gastroenterology

## 2019-09-21 NOTE — Telephone Encounter (Signed)
  Follow up Call-  Call back number 09/19/2019  Post procedure Call Back phone  # [phone]  Permission to leave phone message Yes  Some recent data might be hidden     Patient questions:  Do you have a fever, pain , or abdominal swelling? No. Pain Score  0 *  Have you tolerated food without any problems? Yes.    Have you been able to return to your normal activities? Yes.    Do you have any questions about your discharge instructions: Diet   No. Medications  No. Follow up visit  No.  Do you have questions or concerns about your Care? No.  Actions: * If pain score is 4 or above: No action needed, pain <4.  1. Have you developed a fever since your procedure? no  2.   Have you had an respiratory symptoms (SOB or cough) since your procedure? no  3.   Have you tested positive for COVID 19 since your procedure no  4.   Have you had any family members/close contacts diagnosed with the COVID 19 since your procedure?  no   If yes to any of these questions please route to Joylene John, RN and Erenest Rasher, RN

## 2019-09-27 DIAGNOSIS — R3912 Poor urinary stream: Secondary | ICD-10-CM | POA: Diagnosis not present

## 2019-09-28 DIAGNOSIS — C44612 Basal cell carcinoma of skin of right upper limb, including shoulder: Secondary | ICD-10-CM

## 2019-09-28 HISTORY — DX: Basal cell carcinoma of skin of right upper limb, including shoulder: C44.612

## 2019-10-05 ENCOUNTER — Other Ambulatory Visit: Payer: Self-pay | Admitting: *Deleted

## 2019-10-05 DIAGNOSIS — I6529 Occlusion and stenosis of unspecified carotid artery: Secondary | ICD-10-CM

## 2019-10-10 ENCOUNTER — Other Ambulatory Visit: Payer: Self-pay

## 2019-10-10 ENCOUNTER — Ambulatory Visit (HOSPITAL_COMMUNITY)
Admission: RE | Admit: 2019-10-10 | Discharge: 2019-10-10 | Disposition: A | Discharge status: Home / Self Care | Payer: Medicare Other | Source: Ambulatory Visit | Attending: Vascular Surgery | Admitting: Vascular Surgery

## 2019-10-10 ENCOUNTER — Ambulatory Visit (INDEPENDENT_AMBULATORY_CARE_PROVIDER_SITE_OTHER): Payer: Medicare Other | Admitting: Vascular Surgery

## 2019-10-10 ENCOUNTER — Encounter: Payer: Self-pay | Admitting: Vascular Surgery

## 2019-10-10 VITALS — BP 132/77 | HR 63 | Temp 98.1°F | Resp 18 | Ht 69.0 in | Wt 193.0 lb

## 2019-10-10 DIAGNOSIS — I6529 Occlusion and stenosis of unspecified carotid artery: Secondary | ICD-10-CM

## 2019-10-10 DIAGNOSIS — I6523 Occlusion and stenosis of bilateral carotid arteries: Secondary | ICD-10-CM

## 2019-10-10 NOTE — Progress Notes (Signed)
Patient name: Larry Kent MRN: [id number] DOB: 1946-04-23 Sex: male  REASON FOR CONSULT: Evaluate for high-grade right carotid stenosis  HPI: Larry Kent is a 74 y.o. male, with history hyperlipidemia that presents as a referral from cardiology for evaluation of a high-grade right internal carotid artery stenosis.  Patient denies any previous history of strokes or TIAs.  He's had no neck surgery or neck radiation.  He has been followed by Dr. Fletcher Anon with cardiology and has no cardiac history but was initially found to have a 40 to 59% right ICA stenosis in 2016.  On recent surveillance from 09/01/2019 had a velocity of 366/138 in the right ICA with progression to high grade disease and then was sent to vascular surgery for further evaluation.  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  Past Medical History:  Diagnosis Date  . Allergy   . Arthritis    both knees   . BCC (basal cell carcinoma), face 2014   L preauricular s/p MOHS  . BCC (basal cell carcinoma), face 02/2018   L upper lip  . BPH (benign prostatic hypertrophy)    Dr. Junious Silk @ [facility]  . Carotid stenosis 37/9024   RICA 09-73%, LICA 5-32%, L vertebral occlusion, rpt 1 yr  . Cataract    removed both eyes   . FHx: colon cancer   . FHx: prostate cancer   . Heart murmur    mild aortic stenosis   . Hyperlipidemia    borderline- on rosuvastatin now normal   . Nephrolithiasis 09/2102  . Occlusion of right vertebral artery 05/2015    Past Surgical History:  Procedure Laterality Date  . BASAL CELL CARCINOMA EXCISION  05/2018   lip, 2020 x3  basal cells removed   . CATARACT EXTRACTION  2008   Left  . CATARACT EXTRACTION  2013   Right ([facility])  . COLONOSCOPY  ~2010   medium int hemorrhoids, o/w WNL, rpt 5 yrs given fmhx (Dr. Oletta Lamas)  . exercise treadmill  11/2005   WNL [facility])  . HERNIA REPAIR  2008   Right  . MOHS SURGERY Left spring 2014   basal cell face  . Testicular Biopsy  2003   benign, varicocele  .  US ECHOCARDIOGRAPHY  11/2005   aortic sclerosis, EF 99-24%, diastolic dysfunction    Family History  Problem Relation Age of Onset  . Cancer Father 64       prostate  . Coronary artery disease Father 3       MI  . Prostate cancer Father   . Cancer Paternal Grandfather 14       prostate  . Prostate cancer Paternal Grandfather   . Stroke Mother   . Hypertension Mother   . Cancer Mother 46       colon  . Colon cancer Mother   . Coronary artery disease Maternal Uncle   . Cancer Sister 72       breast  . Breast cancer Sister   . Colon cancer Maternal Grandmother   . Diabetes Neg Hx   . Colon polyps Neg Hx   . Esophageal cancer Neg Hx   . Rectal cancer Neg Hx   . Stomach cancer Neg Hx     SOCIAL HISTORY: Social History   Socioeconomic History  . Marital status: Married    Spouse name: Not on file  . Number of children: Not on file  . Years of education: Not on file  . Highest education level: Not  on file  Occupational History  . Not on file  Tobacco Use  . Smoking status: Never Smoker  . Smokeless tobacco: Never Used  Substance and Sexual Activity  . Alcohol use: Yes    Alcohol/week: 0.0 standard drinks    Comment: rare  . Drug use: No  . Sexual activity: Not on file  Other Topics Concern  . Not on file  Social History Narrative   Caffeine: 2-3 cups coffee/day   Lives with wife and adult son, 1 dog   Occupation: Counsellor   Edu: college   Activity: golf, no regular exercise, occasionally walks with wife   Diet: good amt water, daily fruits/vegetables, fish several times a week   Social Determinants of Health   Financial Resource Strain: Low Risk   . Difficulty of Paying Living Expenses: Not hard at all  Food Insecurity: No Food Insecurity  . Worried About Charity fundraiser in the Last Year: Never true  . Ran Out of Food in the Last Year: Never true  Transportation Needs: No Transportation Needs  . Lack of Transportation (Medical): No  .  Lack of Transportation (Non-Medical): No  Physical Activity: Inactive  . Days of Exercise per Week: 0 days  . Minutes of Exercise per Session: 0 min  Stress: No Stress Concern Present  . Feeling of Stress : Not at all  Social Connections:   . Frequency of Communication with Friends and Family:   . Frequency of Social Gatherings with Friends and Family:   . Attends Religious Services:   . Active Member of Clubs or Organizations:   . Attends Archivist Meetings:   Marland Kitchen Marital Status:   Intimate Partner Violence: Not At Risk  . Fear of Current or Ex-Partner: No  . Emotionally Abused: No  . Physically Abused: No  . Sexually Abused: No    No Known Allergies  Current Outpatient Medications  Medication Sig Dispense Refill  . aspirin EC 81 MG tablet Take 1 tablet (81 mg total) by mouth daily. 90 tablet 3  . Multiple Vitamin (MULTIVITAMIN) tablet Take 1 tablet by mouth daily.    . rosuvastatin (CRESTOR) 20 MG tablet Take 1 tablet (20 mg total) by mouth daily. 90 tablet 3  . tamsulosin (FLOMAX) 0.4 MG CAPS capsule Take 1 capsule (0.4 mg total) by mouth daily.     No current facility-administered medications for this visit.    REVIEW OF SYSTEMS:  [X]  denotes positive finding, [ ]  denotes negative finding Cardiac  Comments:  Chest pain or chest pressure:    Shortness of breath upon exertion:    Short of breath when lying flat:    Irregular heart rhythm:        Vascular    Pain in calf, thigh, or hip brought on by ambulation:    Pain in feet at night that wakes you up from your sleep:     Blood clot in your veins:    Leg swelling:         Pulmonary    Oxygen at home:    Productive cough:     Wheezing:         Neurologic    Sudden weakness in arms or legs:     Sudden numbness in arms or legs:     Sudden onset of difficulty speaking or slurred speech:    Temporary loss of vision in one eye:     Problems with dizziness:  Gastrointestinal    Blood in stool:      Vomited blood:         Genitourinary    Burning when urinating:     Blood in urine:        Psychiatric    Major depression:         Hematologic    Bleeding problems:    Problems with blood clotting too easily:        Skin    Rashes or ulcers:        Constitutional    Fever or chills:      PHYSICAL EXAM: Vitals:   10/10/19 1156 10/10/19 1159  BP: 123/67 132/77  Pulse: 63 63  Resp: 18   Temp: 98.1 F (36.7 C)   TempSrc: Temporal   SpO2: 99%   Weight: 193 lb (87.5 kg)   Height: 5\' 9"  (1.753 m)     GENERAL: The patient is a well-nourished male, in no acute distress. The vital signs are documented above. CARDIAC: There is a regular rate and rhythm.  VASCULAR:  Palpable radial pulses bilaterally No neck incisions Good ROM of neck PULMONARY: There is good air exchange bilaterally without wheezing or rales. ABDOMEN: Soft and non-tender with normal pitched bowel sounds.  MUSCULOSKELETAL: There are no major deformities or cyanosis. NEUROLOGIC: No focal weakness or paresthesias are detected.  CN II-XII grossly intact. SKIN: There are no ulcers or rashes noted. PSYCHIATRIC: The patient has a normal affect.  DATA:   I independently reviewed his carotid duplex from today and agree he has a greater than 80% high-grade stenosis in the right proximal to mid ICA.  There is no significant disease on the left with stenosis ranging from 1 to 39%.  Antegrade flow right vert, retrograde left vert.  Assessment/Plan:  74 year old male with history of hyperlipidemia that presents for evaluation of a high-grade asymptomatic right internal carotid artery stenosis.  This has been followed since 2016 and has had progression on recent surveillance imaging.  I discussed with him that given progression to more than 80% stenosis in the setting of asymptomatic disease we would recommend carotid endarterectomy.  We discussed risks and benefits in detail including risk of bleeding, infection,  hematoma requiring return to the OR, cranial nerve injury, 1% perioperative stroke risk.  I would like to get a CTA neck for operative planning given that his duplex suggest that the lesion extends into the mid ICA and I need to ensure that there is a safe clamp site distally in order to perform endarterectomy.  We will go and get him scheduled today and ensure the CTA is done prior to surgery.   [name], MD [facility] Office: [place]

## 2019-10-11 ENCOUNTER — Encounter: Payer: Self-pay | Admitting: Family Medicine

## 2019-10-11 DIAGNOSIS — C44612 Basal cell carcinoma of skin of right upper limb, including shoulder: Secondary | ICD-10-CM | POA: Diagnosis not present

## 2019-10-16 ENCOUNTER — Ambulatory Visit
Admission: RE | Admit: 2019-10-16 | Discharge: 2019-10-16 | Disposition: A | Discharge status: Home / Self Care | Payer: Medicare Other | Source: Ambulatory Visit | Attending: Vascular Surgery | Admitting: Vascular Surgery

## 2019-10-16 DIAGNOSIS — I6529 Occlusion and stenosis of unspecified carotid artery: Secondary | ICD-10-CM

## 2019-10-16 DIAGNOSIS — I6523 Occlusion and stenosis of bilateral carotid arteries: Secondary | ICD-10-CM | POA: Diagnosis not present

## 2019-10-16 MED ORDER — IOPAMIDOL (ISOVUE-370) INJECTION 76%
75.0000 mL | Freq: Once | INTRAVENOUS | Status: AC | PRN
  Administered 2019-10-16: 75 mL via INTRAVENOUS

## 2019-10-25 ENCOUNTER — Other Ambulatory Visit: Payer: Self-pay

## 2019-11-03 ENCOUNTER — Encounter: Payer: Self-pay | Admitting: Family Medicine

## 2019-11-21 NOTE — Progress Notes (Addendum)
Your procedure is scheduled on Friday June 6.  Report to [facility] Main Entrance "A" at 05:30 A.M., and check in at the Admitting office.  Call this number if you have problems the morning of surgery: [phone]  Call [phone] if you have any questions prior to your surgery date Monday-Friday 8am-4pm   Remember: Do not eat or drink after midnight the night before your surgery   Take these medicines the morning of surgery with A SIP OF WATER: rosuvastatin (CRESTOR)   Follow your surgeon's instructions on when to stop Aspirin.  If no instructions were given by your surgeon then you will need to call the office to get those instructions.     7 days prior to surgery STOP taking any Aspirin (unless otherwise instructed by your surgeon), Aleve, Naproxen, Ibuprofen, Motrin, Advil, Goody's, BC's, all herbal medications, fish oil, and all vitamins.    The Morning of Surgery  Do not wear jewelry  Do not wear lotions, powders, colognes, or deodorant Men may shave face and neck.  Do not bring valuables to the hospital.  [facility] is not responsible for any belongings or valuables.  If you are a smoker, DO NOT Smoke 24 hours prior to surgery  If you wear a CPAP at night please bring your mask the morning of surgery   Remember that you must have someone to transport you home after your surgery, and remain with you for 24 hours if you are discharged the same day.   Please bring cases for contacts, glasses, hearing aids, dentures or bridgework because it cannot be worn into surgery.    Leave your suitcase in the car.  After surgery it may be brought to your room.  For patients admitted to the hospital, discharge time will be determined by your treatment team.  Patients discharged the day of surgery will not be allowed to drive home.    Special instructions:   [place]- Preparing For Surgery  Before surgery, you can play an important role. Because skin is not sterile,  your skin needs to be as free of germs as possible. You can reduce the number of germs on your skin by washing with CHG (chlorahexidine gluconate) Soap before surgery.  CHG is an antiseptic cleaner which kills germs and bonds with the skin to continue killing germs even after washing.    Oral Hygiene is also important to reduce your risk of infection.  Remember - BRUSH YOUR TEETH THE MORNING OF SURGERY WITH YOUR REGULAR TOOTHPASTE  Please do not use if you have an allergy to CHG or antibacterial soaps. If your skin becomes reddened/irritated stop using the CHG.  Do not shave (including legs and underarms) for at least 48 hours prior to first CHG shower. It is OK to shave your face.  Please follow these instructions carefully.   1. Shower the NIGHT BEFORE SURGERY and the MORNING OF SURGERY with CHG Soap.   2. If you chose to wash your hair and body, wash as usual with your normal shampoo and body-wash/soap.  3. Rinse your hair and body thoroughly to remove the shampoo and soap.  4. Apply CHG directly to the skin (ONLY FROM THE NECK DOWN) and wash gently with a scrungie or a clean washcloth.   5. Do not use on open wounds or open sores. Avoid contact with your eyes, ears, mouth and genitals (private parts). Wash Face and genitals (private parts)  with your normal soap.   6. Wash thoroughly,  paying special attention to the area where your surgery will be performed.  7. Thoroughly rinse your body with warm water from the neck down.  8. DO NOT shower/wash with your normal soap after using and rinsing off the CHG Soap.  9. Pat yourself dry with a CLEAN TOWEL.  10. Wear CLEAN PAJAMAS to bed the night before surgery  11. Place CLEAN SHEETS on your bed the night of your first shower and DO NOT SLEEP WITH PETS.  12. Wear comfortable clothes the morning of surgery.     Day of Surgery:  Please shower the morning of surgery with the CHG soap Do not apply any deodorants/lotions. Please wear  clean clothes to the hospital/surgery center.   Remember to brush your teeth WITH YOUR REGULAR TOOTHPASTE.   Please read over the following fact sheets that you were given.

## 2019-11-22 ENCOUNTER — Encounter (HOSPITAL_COMMUNITY): Payer: Self-pay

## 2019-11-22 ENCOUNTER — Encounter (HOSPITAL_COMMUNITY)
Admission: RE | Admit: 2019-11-22 | Discharge: 2019-11-22 | Disposition: A | Discharge status: Home / Self Care | Payer: Medicare Other | Source: Ambulatory Visit | Attending: Vascular Surgery | Admitting: Vascular Surgery

## 2019-11-22 ENCOUNTER — Other Ambulatory Visit: Payer: Self-pay

## 2019-11-22 DIAGNOSIS — Z01818 Encounter for other preprocedural examination: Secondary | ICD-10-CM | POA: Diagnosis not present

## 2019-11-22 LAB — COMPREHENSIVE METABOLIC PANEL
ALT: 30 U/L (ref 0–44)
AST: 31 U/L (ref 15–41)
Albumin: 3.8 g/dL (ref 3.5–5.0)
Alkaline Phosphatase: 68 U/L (ref 38–126)
Anion gap: 5 (ref 5–15)
BUN: 13 mg/dL (ref 8–23)
CO2: 26 mmol/L (ref 22–32)
Calcium: 9 mg/dL (ref 8.9–10.3)
Chloride: 105 mmol/L (ref 98–111)
Creatinine, Ser: 0.95 mg/dL (ref 0.61–1.24)
GFR calc Af Amer: >60 mL/min (ref >60)
GFR calc non Af Amer: >60 mL/min (ref >60)
Glucose, Bld: 105 mg/dL — ABNORMAL HIGH (ref 70–99)
Potassium: 4.2 mmol/L (ref 3.5–5.1)
Sodium: 136 mmol/L (ref 135–145)
Total Bilirubin: 0.9 mg/dL (ref 0.3–1.2)
Total Protein: 7.3 g/dL (ref 6.5–8.1)

## 2019-11-22 LAB — TYPE AND SCREEN
ABO/RH(D): O POS
Antibody Screen: NEGATIVE

## 2019-11-22 LAB — HEMOGLOBIN A1C
Hgb A1c MFr Bld: 6 % — ABNORMAL HIGH (ref 4.8–5.6)
Mean Plasma Glucose: 125.5 mg/dL

## 2019-11-22 LAB — CBC
HCT: 41.9 % (ref 39.0–52.0)
Hemoglobin: 14 g/dL (ref 13.0–17.0)
MCH: 33.8 pg (ref 26.0–34.0)
MCHC: 33.4 g/dL (ref 30.0–36.0)
MCV: 101.2 fL — ABNORMAL HIGH (ref 80.0–100.0)
Platelets: 195 10*3/uL (ref 150–400)
RBC: 4.14 MIL/uL — ABNORMAL LOW (ref 4.22–5.81)
RDW: 13 % (ref 11.5–15.5)
WBC: 4.4 10*3/uL (ref 4.0–10.5)
nRBC: 0 % (ref 0.0–0.2)

## 2019-11-22 LAB — PROTIME-INR
INR: 1 (ref 0.8–1.2)
Prothrombin Time: 12.8 seconds (ref 11.4–15.2)

## 2019-11-22 LAB — URINALYSIS, ROUTINE W REFLEX MICROSCOPIC
Bilirubin Urine: NEGATIVE
Glucose, UA: NEGATIVE mg/dL
Hgb urine dipstick: NEGATIVE
Ketones, ur: NEGATIVE mg/dL
Leukocytes,Ua: NEGATIVE
Nitrite: NEGATIVE
Protein, ur: NEGATIVE mg/dL
Specific Gravity, Urine: 1.011 (ref 1.005–1.030)
pH: 6 (ref 5.0–8.0)

## 2019-11-22 LAB — SURGICAL PCR SCREEN
MRSA, PCR: NEGATIVE
Staphylococcus aureus: POSITIVE — AB

## 2019-11-22 LAB — ABO/RH: ABO/RH(D): O POS

## 2019-11-22 LAB — APTT: aPTT: 35 seconds (ref 24–36)

## 2019-11-22 NOTE — Progress Notes (Signed)
PCP - Leo Grosser, MD  Cardiologist - Fletcher Anon, MD  PPM/ICD - n/a Device Orders -n/a  Rep Notified - n/a  Chest x-ray - n/a EKG - 11/22/19 Stress Test - 11/30/05 ECHO - 08/12/18 Cardiac Cath - pt denies  Sleep Study - n/a CPAP - n/a   Blood Thinner Instructions:n/a Aspirin Instructions: Follow your surgeon's instructions on when to stop Aspirin.  If no instructions were given by your surgeon then you will need to call the office to get those instructions.   Per pt, he will call Dr. Ainsley Spinner office to get instructions for his ASA.   ERAS Protcol - n/a PRE-SURGERY Ensure or G2- n/a  COVID TEST- 11/30/19 at [place] Scre[facility]  Have you experienced the following symptoms:  Cough yes/no: No Fever (>100.40F)  yes/no: No Runny nose yes/no: No Sore throat yes/no: No Difficulty breathing/shortness of breath  yes/no: No  Have you or a family member traveled in the last 14 days and where? yes/no: No   If the patient indicates "YES" to the above questions, their PAT will be rescheduled to limit the exposure to others and, the surgeon will be notified. THE PATIENT WILL NEED TO BE ASYMPTOMATIC FOR 14 DAYS.   If the patient is not experiencing any of these symptoms, the PAT nurse will instruct them to NOT bring anyone with them to their appointment since they may have these symptoms or traveled as well.   Please remind your patients and families that hospital visitation restrictions are in effect and the importance of the restrictions.     Anesthesia review: yes cardiac hx  Patient denies shortness of breath, fever, cough and chest pain at PAT appointment   All instructions explained to the patient, with a verbal understanding of the material. Patient agrees to go over the instructions while at home for a better understanding. Patient also instructed to self quarantine after being tested for COVID-19. The opportunity to ask questions was provided.

## 2019-11-23 NOTE — Progress Notes (Signed)
Anesthesia Chart Review:   Case: [id number] Date/Time: 12/01/19 0715   Procedure: ENDARTERECTOMY CAROTID (Right )   Anesthesia type: General   Pre-op diagnosis: RIGHT CAROTID STENOSIS   Location: [facility] OR ROOM 12 / [place] OR   Surgeons: [name], MD      DISCUSSION:  - Pt is a 74 year old with hx carotid stenosis, mild aortic stenosis (on 08/12/18 echo)   VS: BP 124/71   Pulse (!) 58   Temp 36.7 C (Oral)   Resp 18   Ht 5\' 8"  (1.727 m)   Wt 85.1 kg   SpO2 97%   BMI 28.52 kg/m    PROVIDERS: - PCP is Ria Bush, MD - Sees cardiologist Kathlyn Sacramento, MD for carotid stenosis; Dr. Fletcher Anon referred pt to vascular surgery   LABS: Labs reviewed: Acceptable for surgery. (all labs ordered are listed, but only abnormal results are displayed)  Labs Reviewed  SURGICAL PCR SCREEN - Abnormal; Notable for the following components:      Result Value   Staphylococcus aureus POSITIVE (*)    All other components within normal limits  CBC - Abnormal; Notable for the following components:   RBC 4.14 (*)    MCV 101.2 (*)    All other components within normal limits  COMPREHENSIVE METABOLIC PANEL - Abnormal; Notable for the following components:   Glucose, Bld 105 (*)    All other components within normal limits  HEMOGLOBIN A1C - Abnormal; Notable for the following components:   Hgb A1c MFr Bld 6.0 (*)    All other components within normal limits  APTT  PROTIME-INR  URINALYSIS, ROUTINE W REFLEX MICROSCOPIC  TYPE AND SCREEN  ABO/RH    EKG 11/22/19: NSR   CV:  Carotid duplex 10/10/19:  - Right Carotid: Velocities in the right proximal and mid ICA are consistent with a 80-99% stenosis.  - Left Carotid: Velocities in the left ICA are consistent with a 1-39% stenosis.  - Vertebrals: Right vertebral artery demonstrates antegrade flow. Left vertebral artery demonstrates retrograde flow.  - Subclavians: Normal flow hemodynamics were seen in bilateral subclavian arteries.    Echo 08/12/18:  1. The left ventricle has a visually estimated ejection fraction of of 55%. The cavity size was normal. Left ventricular diastolic Doppler parameters are consistent with impaired relaxation No evidence of left ventricular regional wall motion abnormalities.  2. The right ventricle has normal systolic function. The cavity was normal. There is no increase in right ventricular wall thickness.  3. The mitral valve is normal in structure. Mild calcification of the mitral valve leaflet. There is moderate mitral annular calcification present. No evidence of mitral valve stenosis. No significant regurgitation.  4. The tricuspid valve is normal in structure.  5. The aortic valve is tricuspid Moderate calcification of the aortic valve, especially the noncoronary cusp. Aortic valve regurgitation is trivial by color flow Doppler. Visually, there appeared to be at least mild aortic stenosis but mean gradient only 8 mmHg.  6. The pulmonic valve was normal in structure.  7. The aortic root is normal in size and structure.  8. There is mild dilatation of the ascending aorta measuring 37 mm.  9. No evidence of left ventricular regional wall motion abnormalities.  10. Right atrial pressure is estimated at 3 mmHg.  11. PA systolic pressure 24 mmHg.     Past Medical History:  Diagnosis Date  . Allergy   . Arthritis    both knees   . BCC (basal cell  carcinoma), arm, right 09/2019   MOHS ([place])  . BCC (basal cell carcinoma), face 2014   L preauricular s/p MOHS  . BCC (basal cell carcinoma), face 02/2018   L upper lip  . BPH (benign prostatic hypertrophy)    Dr. Junious Silk @ [facility]  . Carotid stenosis 03/2584   RICA 27-78%, LICA 2-42%, L vertebral occlusion, rpt 1 yr  . Cataract    removed both eyes   . FHx: colon cancer   . FHx: prostate cancer   . Heart murmur    mild aortic stenosis   . Hyperlipidemia    borderline- on rosuvastatin now normal   . Nephrolithiasis 09/2102   . Occlusion of right vertebral artery 05/2015    Past Surgical History:  Procedure Laterality Date  . BASAL CELL CARCINOMA EXCISION  05/2018   lip, 2020 x3  basal cells removed   . CATARACT EXTRACTION  2008   Left  . CATARACT EXTRACTION  2013   Right ([facility])  . COLONOSCOPY  ~2010   medium int hemorrhoids, o/w WNL, rpt 5 yrs given fmhx (Dr. Oletta Lamas)  . exercise treadmill  11/2005   WNL [facility])  . HERNIA REPAIR  2008   Right  . MOHS SURGERY Left spring 2014   basal cell face  . Testicular Biopsy  2003   benign, varicocele  . US ECHOCARDIOGRAPHY  11/2005   aortic sclerosis, EF 35-36%, diastolic dysfunction    MEDICATIONS: . aspirin EC 81 MG tablet  . Multiple Vitamin (MULTIVITAMIN) tablet  . rosuvastatin (CRESTOR) 20 MG tablet  . tamsulosin (FLOMAX) 0.4 MG CAPS capsule   No current facility-administered medications for this encounter.    If no changes, I anticipate pt can proceed with surgery as scheduled.   Willeen Cass, FNP-BC [facility] Short Stay Surgical Center/Anesthesiology Phone: [phone] 11/23/2019 10:21 AM

## 2019-11-30 ENCOUNTER — Other Ambulatory Visit
Admission: RE | Admit: 2019-11-30 | Discharge: 2019-11-30 | Disposition: A | Discharge status: Home / Self Care | Payer: Medicare Other | Source: Ambulatory Visit | Attending: Vascular Surgery | Admitting: Vascular Surgery

## 2019-11-30 ENCOUNTER — Other Ambulatory Visit: Payer: Self-pay

## 2019-11-30 DIAGNOSIS — Z01812 Encounter for preprocedural laboratory examination: Secondary | ICD-10-CM | POA: Diagnosis not present

## 2019-11-30 DIAGNOSIS — Z20822 Contact with and (suspected) exposure to covid-19: Secondary | ICD-10-CM | POA: Diagnosis not present

## 2019-11-30 NOTE — Anesthesia Preprocedure Evaluation (Addendum)
Anesthesia Evaluation  Patient identified by MRN, date of birth, ID band Patient awake    Reviewed: Allergy & Precautions, NPO status , Patient's Chart, lab work & pertinent test results  History of Anesthesia Complications Negative for: history of anesthetic complications  Airway Mallampati: II  TM Distance: >3 FB Neck ROM: Full    Dental  (+) Dental Advisory Given, Teeth Intact   Pulmonary neg pulmonary ROS,    Pulmonary exam normal        Cardiovascular (-) angina+ Peripheral Vascular Disease  + Valvular Problems/Murmurs AS  Rhythm:Regular Rate:Normal + Systolic murmurs  '21 Carotid US - 80-99% right ICAS, 1-39% left ICAS  '20 TTE - EF 55%. Diastolic Doppler parameters are consistent with impaired relaxation.Trivial AI. Visually, there appeared to be at least mild aortic stenosis but mean gradient only 8 mmHg. There is mild dilatation of the ascending aorta measuring 37 mm.     Neuro/Psych negative neurological ROS  negative psych ROS   GI/Hepatic negative GI ROS, Neg liver ROS,   Endo/Other  negative endocrine ROS  Renal/GU negative Renal ROS     Musculoskeletal  (+) Arthritis ,   Abdominal   Peds  Hematology negative hematology ROS (+)   Anesthesia Other Findings Covid neg 6/3  Reproductive/Obstetrics                            Anesthesia Physical Anesthesia Plan  ASA: III  Anesthesia Plan: General   Post-op Pain Management:    Induction: Intravenous  PONV Risk Score and Plan: 3 and Treatment may vary due to age or medical condition, Ondansetron and Dexamethasone  Airway Management Planned: Oral ETT  Additional Equipment: Arterial line  Intra-op Plan:   Post-operative Plan: Extubation in OR  Informed Consent: I have reviewed the patients History and Physical, chart, labs and discussed the procedure including the risks, benefits and alternatives for the proposed  anesthesia with the patient or authorized representative who has indicated his/her understanding and acceptance.     Dental advisory given  Plan Discussed with: CRNA and Anesthesiologist  Anesthesia Plan Comments:        Anesthesia Quick Evaluation

## 2019-12-01 ENCOUNTER — Inpatient Hospital Stay (HOSPITAL_COMMUNITY): Payer: Medicare Other | Admitting: Anesthesiology

## 2019-12-01 ENCOUNTER — Encounter (HOSPITAL_COMMUNITY): Payer: Self-pay | Admitting: Vascular Surgery

## 2019-12-01 ENCOUNTER — Inpatient Hospital Stay (HOSPITAL_COMMUNITY)
Admission: RE | Admit: 2019-12-01 | Discharge: 2019-12-02 | DRG: 039 | Disposition: A | Discharge status: Home / Self Care | Payer: Medicare Other | Attending: Vascular Surgery | Admitting: Vascular Surgery

## 2019-12-01 ENCOUNTER — Encounter (HOSPITAL_COMMUNITY)
Admission: RE | Disposition: A | Discharge status: Home / Self Care | Payer: Self-pay | Source: Home / Self Care | Attending: Vascular Surgery

## 2019-12-01 ENCOUNTER — Inpatient Hospital Stay (HOSPITAL_COMMUNITY): Payer: Medicare Other | Admitting: Emergency Medicine

## 2019-12-01 DIAGNOSIS — N400 Enlarged prostate without lower urinary tract symptoms: Secondary | ICD-10-CM | POA: Diagnosis present

## 2019-12-01 DIAGNOSIS — E785 Hyperlipidemia, unspecified: Secondary | ICD-10-CM | POA: Diagnosis present

## 2019-12-01 DIAGNOSIS — Z7982 Long term (current) use of aspirin: Secondary | ICD-10-CM

## 2019-12-01 DIAGNOSIS — Z823 Family history of stroke: Secondary | ICD-10-CM | POA: Diagnosis not present

## 2019-12-01 DIAGNOSIS — Z8249 Family history of ischemic heart disease and other diseases of the circulatory system: Secondary | ICD-10-CM | POA: Diagnosis not present

## 2019-12-01 DIAGNOSIS — I350 Nonrheumatic aortic (valve) stenosis: Secondary | ICD-10-CM | POA: Diagnosis not present

## 2019-12-01 DIAGNOSIS — Z800 Family history of malignant neoplasm of digestive organs: Secondary | ICD-10-CM | POA: Diagnosis not present

## 2019-12-01 DIAGNOSIS — Z85828 Personal history of other malignant neoplasm of skin: Secondary | ICD-10-CM | POA: Diagnosis not present

## 2019-12-01 DIAGNOSIS — Z803 Family history of malignant neoplasm of breast: Secondary | ICD-10-CM

## 2019-12-01 DIAGNOSIS — Z8042 Family history of malignant neoplasm of prostate: Secondary | ICD-10-CM

## 2019-12-01 DIAGNOSIS — I6521 Occlusion and stenosis of right carotid artery: Principal | ICD-10-CM | POA: Diagnosis present

## 2019-12-01 HISTORY — PX: PATCH ANGIOPLASTY: SHX6230

## 2019-12-01 HISTORY — PX: ENDARTERECTOMY: SHX5162

## 2019-12-01 LAB — CREATININE, SERUM
Creatinine, Ser: 1.1 mg/dL (ref 0.61–1.24)
GFR calc Af Amer: >60 mL/min (ref >60)
GFR calc non Af Amer: >60 mL/min (ref >60)

## 2019-12-01 LAB — CBC
HCT: 37.9 % — ABNORMAL LOW (ref 39.0–52.0)
Hemoglobin: 12.9 g/dL — ABNORMAL LOW (ref 13.0–17.0)
MCH: 33.9 pg (ref 26.0–34.0)
MCHC: 34 g/dL (ref 30.0–36.0)
MCV: 99.5 fL (ref 80.0–100.0)
Platelets: 164 10*3/uL (ref 150–400)
RBC: 3.81 MIL/uL — ABNORMAL LOW (ref 4.22–5.81)
RDW: 13.1 % (ref 11.5–15.5)
WBC: 7.4 10*3/uL (ref 4.0–10.5)
nRBC: 0 % (ref 0.0–0.2)

## 2019-12-01 LAB — POCT ACTIVATED CLOTTING TIME: Activated Clotting Time: 285 seconds

## 2019-12-01 LAB — SARS CORONAVIRUS 2 (TAT 6-24 HRS): SARS Coronavirus 2: NEGATIVE

## 2019-12-01 SURGERY — ENDARTERECTOMY, CAROTID
Anesthesia: General | Site: Neck | Laterality: Right

## 2019-12-01 MED ORDER — PROPOFOL 10 MG/ML IV BOLUS
INTRAVENOUS | Status: DC | PRN
  Administered 2019-12-01: 130 mg via INTRAVENOUS

## 2019-12-01 MED ORDER — FENTANYL CITRATE (PF) 100 MCG/2ML IJ SOLN
25.0000 ug | INTRAMUSCULAR | Status: DC | PRN
  Administered 2019-12-01: 25 ug via INTRAVENOUS

## 2019-12-01 MED ORDER — ROSUVASTATIN CALCIUM 20 MG PO TABS
20.0000 mg | ORAL_TABLET | Freq: Every day | ORAL | Status: DC
  Administered 2019-12-01 – 2019-12-02 (×2): 20 mg via ORAL
  Filled 2019-12-01 (×2): qty 1

## 2019-12-01 MED ORDER — ALUM & MAG HYDROXIDE-SIMETH 200-200-20 MG/5ML PO SUSP: 15.0000 mL | ORAL | Status: DC | PRN

## 2019-12-01 MED ORDER — SODIUM CHLORIDE 0.9 % IV SOLN
INTRAVENOUS | Status: AC
  Filled 2019-12-01: qty 1.2

## 2019-12-01 MED ORDER — HEMOSTATIC AGENTS (NO CHARGE) OPTIME
TOPICAL | Status: DC | PRN
  Administered 2019-12-01: 1 via TOPICAL

## 2019-12-01 MED ORDER — SODIUM CHLORIDE 0.9 % IV SOLN
INTRAVENOUS | Status: DC | PRN
  Administered 2019-12-01: 500 mL

## 2019-12-01 MED ORDER — SODIUM CHLORIDE 0.9 % IV SOLN: INTRAVENOUS | Status: DC

## 2019-12-01 MED ORDER — HEPARIN SODIUM (PORCINE) 5000 UNIT/ML IJ SOLN
5000.0000 [IU] | Freq: Three times a day (TID) | INTRAMUSCULAR | Status: DC
  Administered 2019-12-02: 5000 [IU] via SUBCUTANEOUS
  Filled 2019-12-01: qty 1

## 2019-12-01 MED ORDER — ASPIRIN EC 81 MG PO TBEC
81.0000 mg | DELAYED_RELEASE_TABLET | Freq: Every day | ORAL | Status: DC
  Administered 2019-12-01 – 2019-12-02 (×2): 81 mg via ORAL
  Filled 2019-12-01 (×2): qty 1

## 2019-12-01 MED ORDER — PHENYLEPHRINE HCL-NACL 10-0.9 MG/250ML-% IV SOLN
INTRAVENOUS | Status: DC | PRN
  Administered 2019-12-01: 75 ug/min via INTRAVENOUS

## 2019-12-01 MED ORDER — EPHEDRINE 5 MG/ML INJ
INTRAVENOUS | Status: AC
  Filled 2019-12-01: qty 10

## 2019-12-01 MED ORDER — CHLORHEXIDINE GLUCONATE 0.12 % MT SOLN: 15.0000 mL | Freq: Once | OROMUCOSAL | Status: AC

## 2019-12-01 MED ORDER — SUCCINYLCHOLINE CHLORIDE 200 MG/10ML IV SOSY
PREFILLED_SYRINGE | INTRAVENOUS | Status: AC
  Filled 2019-12-01: qty 10

## 2019-12-01 MED ORDER — LIDOCAINE 2% (20 MG/ML) 5 ML SYRINGE
INTRAMUSCULAR | Status: AC
  Filled 2019-12-01: qty 5

## 2019-12-01 MED ORDER — MAGNESIUM SULFATE 2 GM/50ML IV SOLN: 2.0000 g | Freq: Every day | INTRAVENOUS | Status: DC | PRN

## 2019-12-01 MED ORDER — GUAIFENESIN-DM 100-10 MG/5ML PO SYRP: 15.0000 mL | ORAL_SOLUTION | ORAL | Status: DC | PRN

## 2019-12-01 MED ORDER — CHLORHEXIDINE GLUCONATE 0.12 % MT SOLN
OROMUCOSAL | Status: AC
  Administered 2019-12-01: 15 mL via OROMUCOSAL
  Filled 2019-12-01: qty 15

## 2019-12-01 MED ORDER — LABETALOL HCL 5 MG/ML IV SOLN: 10.0000 mg | INTRAVENOUS | Status: DC | PRN

## 2019-12-01 MED ORDER — MORPHINE SULFATE (PF) 2 MG/ML IV SOLN: 2.0000 mg | INTRAVENOUS | Status: DC | PRN

## 2019-12-01 MED ORDER — PHENYLEPHRINE 40 MCG/ML (10ML) SYRINGE FOR IV PUSH (FOR BLOOD PRESSURE SUPPORT)
PREFILLED_SYRINGE | INTRAVENOUS | Status: DC | PRN
  Administered 2019-12-01: 120 ug via INTRAVENOUS

## 2019-12-01 MED ORDER — PROPOFOL 10 MG/ML IV BOLUS
INTRAVENOUS | Status: AC
  Filled 2019-12-01: qty 20

## 2019-12-01 MED ORDER — PANTOPRAZOLE SODIUM 40 MG PO TBEC
40.0000 mg | DELAYED_RELEASE_TABLET | Freq: Every day | ORAL | Status: DC
  Administered 2019-12-01 – 2019-12-02 (×2): 40 mg via ORAL
  Filled 2019-12-01 (×2): qty 1

## 2019-12-01 MED ORDER — SODIUM CHLORIDE 0.9 % IV SOLN: 500.0000 mL | Freq: Once | INTRAVENOUS | Status: DC | PRN

## 2019-12-01 MED ORDER — ACETAMINOPHEN 650 MG RE SUPP: 325.0000 mg | RECTAL | Status: DC | PRN

## 2019-12-01 MED ORDER — CEFAZOLIN SODIUM-DEXTROSE 2-4 GM/100ML-% IV SOLN
2.0000 g | INTRAVENOUS | Status: AC
  Administered 2019-12-01: 2 g via INTRAVENOUS

## 2019-12-01 MED ORDER — OXYCODONE HCL 5 MG/5ML PO SOLN: 5.0000 mg | Freq: Once | ORAL | Status: DC | PRN

## 2019-12-01 MED ORDER — HYDRALAZINE HCL 20 MG/ML IJ SOLN: 5.0000 mg | INTRAMUSCULAR | Status: DC | PRN

## 2019-12-01 MED ORDER — GLYCOPYRROLATE PF 0.2 MG/ML IJ SOSY
PREFILLED_SYRINGE | INTRAMUSCULAR | Status: DC | PRN
  Administered 2019-12-01: .2 mg via INTRAVENOUS

## 2019-12-01 MED ORDER — CHLORHEXIDINE GLUCONATE CLOTH 2 % EX PADS: 6.0000 | MEDICATED_PAD | Freq: Once | CUTANEOUS | Status: DC

## 2019-12-01 MED ORDER — ACETAMINOPHEN 325 MG PO TABS
325.0000 mg | ORAL_TABLET | ORAL | Status: DC | PRN
  Administered 2019-12-01: 650 mg via ORAL
  Filled 2019-12-01: qty 2

## 2019-12-01 MED ORDER — LIDOCAINE 2% (20 MG/ML) 5 ML SYRINGE
INTRAMUSCULAR | Status: DC | PRN
  Administered 2019-12-01: 60 mg via INTRAVENOUS

## 2019-12-01 MED ORDER — 0.9 % SODIUM CHLORIDE (POUR BTL) OPTIME
TOPICAL | Status: DC | PRN
  Administered 2019-12-01: 2000 mL

## 2019-12-01 MED ORDER — ONDANSETRON HCL 4 MG/2ML IJ SOLN: 4.0000 mg | Freq: Four times a day (QID) | INTRAMUSCULAR | Status: DC | PRN

## 2019-12-01 MED ORDER — EPHEDRINE SULFATE-NACL 50-0.9 MG/10ML-% IV SOSY
PREFILLED_SYRINGE | INTRAVENOUS | Status: DC | PRN
  Administered 2019-12-01 (×3): 10 mg via INTRAVENOUS
  Administered 2019-12-01 (×3): 5 mg via INTRAVENOUS

## 2019-12-01 MED ORDER — HEPARIN SODIUM (PORCINE) 1000 UNIT/ML IJ SOLN
INTRAMUSCULAR | Status: AC
  Filled 2019-12-01: qty 1

## 2019-12-01 MED ORDER — SODIUM CHLORIDE 0.9 % IV SOLN
0.0125 ug/kg/min | INTRAVENOUS | Status: DC
  Administered 2019-12-01: .05 ug/kg/min via INTRAVENOUS
  Filled 2019-12-01: qty 2000

## 2019-12-01 MED ORDER — CEFAZOLIN SODIUM-DEXTROSE 2-4 GM/100ML-% IV SOLN
INTRAVENOUS | Status: AC
  Administered 2019-12-02: 2 g via INTRAVENOUS
  Filled 2019-12-01: qty 100

## 2019-12-01 MED ORDER — DEXAMETHASONE SODIUM PHOSPHATE 10 MG/ML IJ SOLN
INTRAMUSCULAR | Status: AC
  Filled 2019-12-01: qty 1

## 2019-12-01 MED ORDER — ONDANSETRON HCL 4 MG/2ML IJ SOLN
INTRAMUSCULAR | Status: DC | PRN
  Administered 2019-12-01: 4 mg via INTRAVENOUS

## 2019-12-01 MED ORDER — HEPARIN SODIUM (PORCINE) 1000 UNIT/ML IJ SOLN
INTRAMUSCULAR | Status: DC | PRN
Start: 2019-12-01 — End: 2019-12-01
  Administered 2019-12-01: 9000 [IU] via INTRAVENOUS

## 2019-12-01 MED ORDER — SENNOSIDES-DOCUSATE SODIUM 8.6-50 MG PO TABS: 1.0000 | ORAL_TABLET | Freq: Every evening | ORAL | Status: DC | PRN

## 2019-12-01 MED ORDER — FENTANYL CITRATE (PF) 250 MCG/5ML IJ SOLN
INTRAMUSCULAR | Status: DC | PRN
  Administered 2019-12-01 (×2): 50 ug via INTRAVENOUS

## 2019-12-01 MED ORDER — LACTATED RINGERS IV SOLN: INTRAVENOUS | Status: DC | PRN

## 2019-12-01 MED ORDER — CEFAZOLIN SODIUM-DEXTROSE 2-4 GM/100ML-% IV SOLN
2.0000 g | Freq: Three times a day (TID) | INTRAVENOUS | Status: AC
  Administered 2019-12-01: 2 g via INTRAVENOUS
  Filled 2019-12-01 (×2): qty 100

## 2019-12-01 MED ORDER — SUGAMMADEX SODIUM 200 MG/2ML IV SOLN
INTRAVENOUS | Status: DC | PRN
  Administered 2019-12-01: 200 mg via INTRAVENOUS

## 2019-12-01 MED ORDER — FENTANYL CITRATE (PF) 100 MCG/2ML IJ SOLN
INTRAMUSCULAR | Status: AC
  Filled 2019-12-01: qty 2

## 2019-12-01 MED ORDER — ROCURONIUM BROMIDE 10 MG/ML (PF) SYRINGE
PREFILLED_SYRINGE | INTRAVENOUS | Status: DC | PRN
  Administered 2019-12-01: 50 mg via INTRAVENOUS

## 2019-12-01 MED ORDER — ZOLPIDEM TARTRATE 5 MG PO TABS: 5.0000 mg | ORAL_TABLET | Freq: Every evening | ORAL | Status: DC | PRN

## 2019-12-01 MED ORDER — OXYCODONE HCL 5 MG PO TABS: 5.0000 mg | ORAL_TABLET | Freq: Once | ORAL | Status: DC | PRN

## 2019-12-01 MED ORDER — FENTANYL CITRATE (PF) 250 MCG/5ML IJ SOLN
INTRAMUSCULAR | Status: AC
  Filled 2019-12-01: qty 5

## 2019-12-01 MED ORDER — OXYCODONE HCL 5 MG PO TABS
5.0000 mg | ORAL_TABLET | ORAL | Status: DC | PRN
  Administered 2019-12-02: 5 mg via ORAL
  Filled 2019-12-01: qty 1

## 2019-12-01 MED ORDER — LIDOCAINE HCL (PF) 1 % IJ SOLN
INTRAMUSCULAR | Status: AC
  Filled 2019-12-01: qty 5

## 2019-12-01 MED ORDER — PHENOL 1.4 % MT LIQD: 1.0000 | OROMUCOSAL | Status: DC | PRN

## 2019-12-01 MED ORDER — ORAL CARE MOUTH RINSE: 15.0000 mL | Freq: Once | OROMUCOSAL | Status: AC

## 2019-12-01 MED ORDER — DOCUSATE SODIUM 100 MG PO CAPS
100.0000 mg | ORAL_CAPSULE | Freq: Every day | ORAL | Status: DC
  Administered 2019-12-02: 100 mg via ORAL
  Filled 2019-12-01: qty 1

## 2019-12-01 MED ORDER — TAMSULOSIN HCL 0.4 MG PO CAPS
0.4000 mg | ORAL_CAPSULE | Freq: Every day | ORAL | Status: DC
  Administered 2019-12-01 – 2019-12-02 (×2): 0.4 mg via ORAL
  Filled 2019-12-01 (×2): qty 1

## 2019-12-01 MED ORDER — PHENYLEPHRINE 40 MCG/ML (10ML) SYRINGE FOR IV PUSH (FOR BLOOD PRESSURE SUPPORT)
PREFILLED_SYRINGE | INTRAVENOUS | Status: AC
  Filled 2019-12-01: qty 10

## 2019-12-01 MED ORDER — ONDANSETRON HCL 4 MG/2ML IJ SOLN: 4.0000 mg | Freq: Once | INTRAMUSCULAR | Status: DC | PRN

## 2019-12-01 MED ORDER — METOPROLOL TARTRATE 5 MG/5ML IV SOLN: 2.0000 mg | INTRAVENOUS | Status: DC | PRN

## 2019-12-01 MED ORDER — PROTAMINE SULFATE 10 MG/ML IV SOLN
INTRAVENOUS | Status: DC | PRN
Start: 2019-12-01 — End: 2019-12-01
  Administered 2019-12-01: 10 mg via INTRAVENOUS
  Administered 2019-12-01 (×2): 20 mg via INTRAVENOUS

## 2019-12-01 MED ORDER — ROCURONIUM BROMIDE 10 MG/ML (PF) SYRINGE
PREFILLED_SYRINGE | INTRAVENOUS | Status: AC
  Filled 2019-12-01: qty 10

## 2019-12-01 MED ORDER — DEXAMETHASONE SODIUM PHOSPHATE 10 MG/ML IJ SOLN
INTRAMUSCULAR | Status: DC | PRN
  Administered 2019-12-01: 5 mg via INTRAVENOUS

## 2019-12-01 MED ORDER — POTASSIUM CHLORIDE CRYS ER 20 MEQ PO TBCR: 20.0000 meq | EXTENDED_RELEASE_TABLET | Freq: Every day | ORAL | Status: DC | PRN

## 2019-12-01 SURGICAL SUPPLY — 60 items
ADH SKN CLS APL DERMABOND .7 (GAUZE/BANDAGES/DRESSINGS) ×1
ADPR TBG 2 MALE LL ART (MISCELLANEOUS)
CANISTER SUCT 3000ML PPV (MISCELLANEOUS) ×3 IMPLANT
CATH ROBINSON RED A/P 18FR (CATHETERS) ×3 IMPLANT
CLIP VESOCCLUDE MED 24/CT (CLIP) ×3 IMPLANT
CLIP VESOCCLUDE SM WIDE 24/CT (CLIP) ×3 IMPLANT
COVER PROBE W GEL 5X96 (DRAPES) ×3 IMPLANT
COVER SURGICAL LIGHT HANDLE (MISCELLANEOUS) ×3 IMPLANT
COVER TRANSDUCER ULTRASND GEL (DISPOSABLE) ×3 IMPLANT
COVER WAND RF STERILE (DRAPES) IMPLANT
DERMABOND ADVANCED (GAUZE/BANDAGES/DRESSINGS) ×2
DERMABOND ADVANCED .7 DNX12 (GAUZE/BANDAGES/DRESSINGS) ×1 IMPLANT
DRAIN CHANNEL 15F RND FF W/TCR (WOUND CARE) IMPLANT
DRAPE ORTHO SPLIT 77X108 STRL (DRAPES) ×3
DRAPE SURG ORHT 6 SPLT 77X108 (DRAPES) ×1 IMPLANT
ELECT REM PT RETURN 9FT ADLT (ELECTROSURGICAL) ×3
ELECTRODE REM PT RTRN 9FT ADLT (ELECTROSURGICAL) ×1 IMPLANT
EVACUATOR SILICONE 100CC (DRAIN) IMPLANT
GLOVE BIO SURGEON STRL SZ7.5 (GLOVE) ×3 IMPLANT
GLOVE BIOGEL PI IND STRL 6.5 (GLOVE) ×4 IMPLANT
GLOVE BIOGEL PI IND STRL 8 (GLOVE) ×1 IMPLANT
GLOVE BIOGEL PI INDICATOR 6.5 (GLOVE) ×8
GLOVE BIOGEL PI INDICATOR 8 (GLOVE) ×2
GLOVE ECLIPSE 6.5 STRL STRAW (GLOVE) ×3 IMPLANT
GLOVE SURG SS PI 6.5 STRL IVOR (GLOVE) ×6 IMPLANT
GOWN STRL REUS W/ TWL LRG LVL3 (GOWN DISPOSABLE) ×3 IMPLANT
GOWN STRL REUS W/ TWL XL LVL3 (GOWN DISPOSABLE) ×4 IMPLANT
GOWN STRL REUS W/TWL LRG LVL3 (GOWN DISPOSABLE) ×9
GOWN STRL REUS W/TWL XL LVL3 (GOWN DISPOSABLE) ×12
HEMOSTAT SNOW SURGICEL 2X4 (HEMOSTASIS) ×3 IMPLANT
IV ADAPTER SYR DOUBLE MALE LL (MISCELLANEOUS) IMPLANT
KIT BASIN OR (CUSTOM PROCEDURE TRAY) ×3 IMPLANT
KIT SHUNT ARGYLE CAROTID ART 6 (VASCULAR PRODUCTS) IMPLANT
KIT TURNOVER KIT B (KITS) ×3 IMPLANT
LOOP VESSEL MINI RED (MISCELLANEOUS) ×3 IMPLANT
MARKER SKIN DUAL TIP RULER LAB (MISCELLANEOUS) ×3 IMPLANT
NEEDLE HYPO 25GX1X1/2 BEV (NEEDLE) IMPLANT
NEEDLE SPNL 20GX3.5 QUINCKE YW (NEEDLE) IMPLANT
NS IRRIG 1000ML POUR BTL (IV SOLUTION) ×9 IMPLANT
PACK CAROTID (CUSTOM PROCEDURE TRAY) ×3 IMPLANT
PAD ARMBOARD 7.5X6 YLW CONV (MISCELLANEOUS) ×6 IMPLANT
PATCH VASC XENOSURE 1CMX6CM (Vascular Products) ×3 IMPLANT
PATCH VASC XENOSURE 1X6 (Vascular Products) ×1 IMPLANT
POSITIONER HEAD DONUT 9IN (MISCELLANEOUS) ×3 IMPLANT
SHUNT CAROTID BYPASS 10 (VASCULAR PRODUCTS) ×3 IMPLANT
SHUNT CAROTID BYPASS 12FRX15.5 (VASCULAR PRODUCTS) IMPLANT
SPONGE SURGIFOAM ABS GEL 100 (HEMOSTASIS) IMPLANT
STOPCOCK 4 WAY LG BORE MALE ST (IV SETS) IMPLANT
SUT ETHILON 3 0 PS 1 (SUTURE) IMPLANT
SUT MNCRL AB 4-0 PS2 18 (SUTURE) ×3 IMPLANT
SUT PROLENE 5 0 C 1 24 (SUTURE) ×6 IMPLANT
SUT PROLENE 6 0 BV (SUTURE) ×12 IMPLANT
SUT SILK 3 0 (SUTURE)
SUT SILK 3-0 18XBRD TIE 12 (SUTURE) IMPLANT
SUT VIC AB 3-0 SH 27 (SUTURE) ×3
SUT VIC AB 3-0 SH 27X BRD (SUTURE) ×1 IMPLANT
SYR CONTROL 10ML LL (SYRINGE) IMPLANT
TOWEL GREEN STERILE (TOWEL DISPOSABLE) ×3 IMPLANT
TUBING ART PRESS 48 MALE/FEM (TUBING) IMPLANT
WATER STERILE IRR 1000ML POUR (IV SOLUTION) ×3 IMPLANT

## 2019-12-01 NOTE — Progress Notes (Signed)
Mobility Specialist - Progress Note   12/01/19 1543  Mobility  Activity Ambulated in hall  Level of Assistance Independent  Assistive Device None  Distance Ambulated (ft) 1000 ft  Mobility Response Tolerated well  Mobility performed by Mobility specialist  $Mobility charge 1 Mobility    Pre-mobility: 62 HR,105/69 BP, 94% SpO2 During mobility: 97 HR, 97% SpO2 Post-mobility: 81 HR, 92/68 BP, 94% SpO2  Pt endorses mild lightheadedness towards the end of his walk as well as some mild neck incision site soreness.   [place] Specialist

## 2019-12-01 NOTE — Progress Notes (Signed)
  Progress Note    12/01/2019 1:49 PM Day of Surgery  Subjective:  No complaints says he is feeling well. Denies any trouble talking or swallowing, no headache, numbness or weakness of extremities   Vitals:   12/01/19 1257 12/01/19 1316  BP: 103/60 101/65  Pulse: 60 67  Resp: 14 15  Temp: 98 F (36.7 C) 97.9 F (36.6 C)  SpO2: 96% 97%   Physical Exam: Cardiac: regular Lungs:  Non labored Incisions: right neck incision is clean, dry and intact. No hematoma or swelling Extremities: moving all extremities without deficits Neurologic: alert and oriented, CN intact, tongue midline, face symmetric, speech coherent  CBC    Component Value Date/Time   WBC 4.4 11/22/2019 1000   RBC 4.14 (L) 11/22/2019 1000   HGB 14.0 11/22/2019 1000   HCT 41.9 11/22/2019 1000   PLT 195 11/22/2019 1000   MCV 101.2 (H) 11/22/2019 1000   MCH 33.8 11/22/2019 1000   MCHC 33.4 11/22/2019 1000   RDW 13.0 11/22/2019 1000    BMET    Component Value Date/Time   NA 136 11/22/2019 1000   K 4.2 11/22/2019 1000   CL 105 11/22/2019 1000   CO2 26 11/22/2019 1000   GLUCOSE 105 (H) 11/22/2019 1000   BUN 13 11/22/2019 1000   CREATININE 0.95 11/22/2019 1000   CALCIUM 9.0 11/22/2019 1000   GFRNONAA >60 11/22/2019 1000   GFRAA >60 11/22/2019 1000    INR    Component Value Date/Time   INR 1.0 11/22/2019 1000     Intake/Output Summary (Last 24 hours) at 12/01/2019 1349 Last data filed at 12/01/2019 1046 Gross per 24 hour  Intake 1400 ml  Output 450 ml  Net 950 ml     Assessment/Plan:  74 y.o. male is s/p right carotid endarterectomy Day of Surgery. Patient doing well post op. Neurologically intact. Right neck incision c/d/i without hematoma. Just got to 4E. Ambulate as tolerated. Advance diet. Anticipate discharge tomorrow if he does well overnight    Karoline Caldwell, [place] [facility] [phone] 12/01/2019 1:49 PM

## 2019-12-01 NOTE — Anesthesia Postprocedure Evaluation (Signed)
Anesthesia Post Note  Patient: Larry Kent  Procedure(s) Performed: RIGHT ENDARTERECTOMY CAROTID (Right Neck) PATCH ANGIOPLASTY USING XENOSURE BIOLOGIC PATCH (Right Neck)     Patient location during evaluation: PACU Anesthesia Type: General Level of consciousness: awake and alert Pain management: pain level controlled Vital Signs Assessment: post-procedure vital signs reviewed and stable Respiratory status: spontaneous breathing, nonlabored ventilation and respiratory function stable Cardiovascular status: blood pressure returned to baseline and stable Postop Assessment: no apparent nausea or vomiting Anesthetic complications: no    Last Vitals:  Vitals:   12/01/19 1430 12/01/19 1500  BP: 113/71   Pulse: 76 66  Resp: 17 16  Temp:    SpO2: 97% 93%    Last Pain:  Vitals:   12/01/19 1430  TempSrc:   PainSc: 0-No pain                 Audry Pili

## 2019-12-01 NOTE — Discharge Instructions (Signed)
° °  [facility] ° °Discharge Instructions °  °Carotid Endarterectomy (CEA) ° °Please refer to the following instructions for your post-procedure care. Your surgeon or physician assistant will discuss any changes with you. ° °Activity ° °You are encouraged to walk as much as you can. You can slowly return to normal activities but must avoid strenuous activity and heavy lifting until your doctor tell you it's okay. Avoid activities such as vacuuming or swinging a golf club. You can drive after one week if you are comfortable and you are no longer taking prescription pain medications. It is normal to feel tired for serval weeks after your surgery. It is also normal to have difficulty with sleep habits, eating, and bowel movements after surgery. These will go away with time. ° °Bathing/Showering ° °Shower daily after you go home. Do not soak in a bathtub, hot tub, or swim until the incision heals completely. ° °Incision Care ° °Shower every day. Clean your incision with mild soap and water. Pat the area dry with a clean towel. You do not need a bandage unless otherwise instructed. Do not apply any ointments or creams to your incision. You may have skin glue on your incision. Do not peel it off. It will come off on its own in about one week. Your incision may feel thickened and raised for several weeks after your surgery. This is normal and the skin will soften over time.  ° °For Men Only: It's okay to shave around the incision but do not shave the incision itself for 2 weeks. It is common to have numbness under your chin that could last for several months. ° °Diet ° °Resume your normal diet. There are no special food restrictions following this procedure. A low fat/low cholesterol diet is recommended for all patients with vascular disease. In order to heal from your surgery, it is CRITICAL to get adequate nutrition. Your body requires vitamins, minerals, and protein. Vegetables are the  best source of vitamins and minerals. Vegetables also provide the perfect balance of protein. Processed food has little nutritional value, so try to avoid this. ° °Medications ° °Resume taking all of your medications unless your doctor or physician assistant tells you not to. If your incision is causing pain, you may take over-the- counter pain relievers such as acetaminophen (Tylenol). If you were prescribed a stronger pain medication, please be aware these medications can cause nausea and constipation. Prevent nausea by taking the medication with a snack or meal. Avoid constipation by drinking plenty of fluids and eating foods with a high amount of fiber, such as fruits, vegetables, and grains.  °Do not take Tylenol if you are taking prescription pain medications. ° °Follow Up ° °Our office will schedule a follow up appointment 2-3 weeks following discharge. ° °Please call us immediately for any of the following conditions ° °Increased pain, redness, drainage (pus) from your incision site. °Fever of 101 degrees or higher. °If you should develop stroke (slurred speech, difficulty swallowing, weakness on one side of your body, loss of vision) you should call 911 and go to the nearest emergency room. ° °Reduce your risk of vascular disease: ° °Stop smoking. If you would like help call [facility] at 1-800-QUIT-NOW (1-800-784-8669) or [facility] at 336-586-4000. °Manage your cholesterol °Maintain a desired weight °Control your diabetes °Keep your blood pressure down ° °If you have any questions, please call the office at [phone]. ° °

## 2019-12-01 NOTE — Op Note (Signed)
OPERATIVE NOTE  PROCEDURE:  right carotid endarterectomy with bovine patch angioplasty  PRE-OPERATIVE DIAGNOSIS: right asymptomatic high grade carotid stenosis  POST-OPERATIVE DIAGNOSIS: same as above   SURGEON: [name], MD  ASSISTANT(S): Paulo Fruit, PA  ANESTHESIA: general  ESTIMATED BLOOD LOSS: minimal   FINDING(S): 1.  High grade ulcerated right carotid plaque. 2.  Continuous Doppler audible flow signatures are appropriate for each carotid artery after endarterectomy and patch angioplasty.  SPECIMEN(S):  Carotid plaque (sent to Pathology)  INDICATIONS:   Larry Kent is a 74 y.o. male who presents with right asymptomatic high grade carotid stenosis with likely ulcerated plaque.  I discussed with the patient the risks, benefits, and alternatives to carotid endarterectomy.  I discussed the procedural details of carotid endarterectomy with the patient.  The patient is aware that the risks of carotid endarterectomy include but are not limited to: bleeding, infection, stroke, myocardial infarction, death, cranial nerve injuries both temporary and permanent, neck hematoma, possible airway compromise, labile blood pressure post-operatively, cerebral hyperperfusion syndrome, and possible need for additional interventions in the future. The patient is aware of the risks and agrees to proceed forward with the procedure.  DESCRIPTION: After full informed written consent was obtained from the patient, the patient was brought back to the operating room and placed supine upon the operating table.  Prior to induction, the patient received IV antibiotics.  After obtaining adequate anesthesia, the patient was placed into semi-Fowler position with a shoulder roll in place and the patient's neck slightly hyperextended and rotated away from the surgical site.  The patient was prepped in the standard fashion for a right carotid endarterectomy.  I made an incision anterior to the  sternocleidomastoid muscle and dissected down through the subcutaneous tissue.  The platysma was opened with electrocautery.  I then used Bovie cautery and blunt dissection to dissect through the underlying platysma and to mobilize the anterior border of the sternocleidomastoid as well as the internal jugular vein laterally.  The facial vein was ligated with 3-0 silk and surgical clips and divided.  After identifying the carotid artery I used Metzenbaum scissors to bluntly dissect the common carotid artery and then controlled this with a umbilical tape.  At this point in time the patient was given 100 units/kg of IV heparin and we checked an ACT to ensure it was greater than 250.  I then carried my dissection cephalad and mobilized the external carotid artery and superior thyroid artery and controlled each of these with a vessel loop.  I then dissected out the internal carotid artery well past the distal plaque.  The internal carotid artery was then controlled with a umbilical tape as well. I was careful to identify the vagus nerve between the internal jugular and common carotid and this was presereved.  I was also careful to identify and preserve the hypoglossal nerve and this was preserved.    Once our ACT was confirmed, I proceeded by clamping the internal carotid artery with a angled bulldog clamp first.  The proximal common carotid artery was controlled with a angled debakey clamp.  The external carotid was controlled with a vessel loop.  I subsequently opened the common carotid artery with an 11 blade scalpel in longitudinal fashion and extended the arteriotomy with Potts scissors onto the ICA past the distal plaque.   I then used a Garment/textile technologist and performed a endarterectomy starting in the common carotid artery.  The external carotid artery was endarterectomized with an eversion  technique and I was careful to feather the distal ICA plaque.  The specimen was passed off the field.  At this point a 10  [place] shunt was brought to the field and then initially placed distally into the ICA after removing the clamp and controlled with a umbilical tape. The shunt was back bleed with good flow.  I then placed the proximal end of the shunt in the common carotid artery and controlled this with a Rummel tourniquet.  The endarterectomy site was then flushed with heparinized saline and I was careful to ensure there were no flaps in the endarterectomy site.  I then brought a bovine carotid patch on the field and this was sewn in place with a running anastomosis using a 6-0 Prolene distally and a 5-0 proximal.  The bovine patch was trimmed accordingly.  The shunt was removed just before completion of the patch.  The artery was flushed antegrade and retrograde prior to completion of the patch.  Once the patch was complete, I flushed up the external carotid artery first prior to releasing the internal carotid artery clamp.  An intraoperative doppler was performed with good flow signatures throughout the common, external, and internal carotid arteries.  Once I was happy with the intraoperative doppler the patient was given protamine for reversal.  I used surgicel snow to get hemostasis around the patch.  Ultimately the platysma was closed in running fashion with 3-0 Vicryl.  The skin was closed with a running 4-0 Monocryl.  Dermabond was applied with a dry sterile dressing.  The patient was awakened from anesthesia with no new neurological deficit and taken to PACU in stable condition.    COMPLICATIONS: None  CONDITION: Stable  [name], MD [facility]: [phone]  12/01/2019, 9:58 AM

## 2019-12-01 NOTE — Anesthesia Procedure Notes (Signed)
Procedure Name: Intubation Date/Time: 12/01/2019 7:52 AM Performed by: Myna Bright, CRNA Pre-anesthesia Checklist: Patient identified, Emergency Drugs available, Suction available and Patient being monitored Patient Re-evaluated:Patient Re-evaluated prior to induction Oxygen Delivery Method: Circle system utilized Preoxygenation: Pre-oxygenation with 100% oxygen Induction Type: IV induction Ventilation: Mask ventilation without difficulty and Oral airway inserted - appropriate to patient size Laryngoscope Size: Mac and 4 Grade View: Grade II Tube type: Oral Tube size: 7.5 mm Number of attempts: 1 Airway Equipment and Method: Stylet Placement Confirmation: ETT inserted through vocal cords under direct vision,  positive ETCO2 and breath sounds checked- equal and bilateral Secured at: 22 cm Tube secured with: Tape Dental Injury: Teeth and Oropharynx as per pre-operative assessment

## 2019-12-01 NOTE — Transfer of Care (Signed)
Immediate Anesthesia Transfer of Care Note  Patient: Larry Kent  Procedure(s) Performed: RIGHT ENDARTERECTOMY CAROTID (Right Neck) PATCH ANGIOPLASTY USING XENOSURE BIOLOGIC PATCH (Right Neck)  Patient Location: PACU  Anesthesia Type:General  Level of Consciousness: awake, alert , oriented and patient cooperative  Airway & Oxygen Therapy: Patient Spontanous Breathing and Patient connected to face mask oxygen  Post-op Assessment: Report given to RN, Post -op Vital signs reviewed and stable and Patient moving all extremities  Post vital signs: Reviewed and stable  Last Vitals:  Vitals Value Taken Time  BP 134/81 12/01/19 1001  Temp 37.1 C 12/01/19 1000  Pulse 73 12/01/19 1008  Resp 12 12/01/19 1008  SpO2 94 % 12/01/19 1008  Vitals shown include unvalidated device data.  Last Pain:  Vitals:   12/01/19 1000  TempSrc:   PainSc: 1          Complications: No apparent anesthesia complications

## 2019-12-01 NOTE — Progress Notes (Addendum)
12/01/2019 1400 Received pt from PACU S/P carotid endarterectomy.   Pt is A&O, no C/O voiced.  Neuro intact.  Tele monitor applied and CCMD notified.  CHG bath given.  Oriented to room, call light and bed.  Call bell in reach, family at bedside. [place]

## 2019-12-01 NOTE — H&P (Signed)
History and Physical Interval Note:  12/01/2019 7:34 AM  Larry Kent  has presented today for surgery, with the diagnosis of RIGHT CAROTID STENOSIS.  The various methods of treatment have been discussed with the patient and family. After consideration of risks, benefits and other options for treatment, the patient has consented to  Procedure(s): RIGHT ENDARTERECTOMY CAROTID (Right) as a surgical intervention.  The patient's history has been reviewed, patient examined, no change in status, stable for surgery.  I have reviewed the patient's chart and labs.  Questions were answered to the patient's satisfaction.    Right CEA.    Larry Kent  Patient name: Larry Kent MRN: [id number] DOB: [birth year] Sex: male  REASON FOR CONSULT: Evaluate for high-grade right carotid stenosis  HPI:  Larry Kent is a 74 y.o. male, with history hyperlipidemia that presents as a referral from cardiology for evaluation of a high-grade right internal carotid artery stenosis. Patient denies any previous history of strokes or TIAs. He's had no neck surgery or neck radiation. He has been followed by Dr. Fletcher Anon with cardiology and has no cardiac history but was initially found to have a 40 to 59% right ICA stenosis in 2016. On recent surveillance from 09/01/2019 had a velocity of 366/138 in the right ICA with progression to high grade disease and then was sent to vascular surgery for further evaluation. 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      Past Medical History:  Diagnosis Date  . Allergy   . Arthritis    both knees   . BCC (basal cell carcinoma), face 2014   L preauricular s/p MOHS  . BCC (basal cell carcinoma), face 02/2018   L upper lip  . BPH (benign prostatic hypertrophy)    Dr. Junious Silk @ [facility]  . Carotid stenosis 29/5284   RICA 13-24%, LICA 4-01%, L vertebral occlusion, rpt 1 yr  . Cataract    removed both eyes   . FHx: colon cancer   . FHx: prostate cancer   . Heart murmur    mild  aortic stenosis   . Hyperlipidemia    borderline- on rosuvastatin now normal   . Nephrolithiasis 09/2102  . Occlusion of right vertebral artery 05/2015        Past Surgical History:  Procedure Laterality Date  . BASAL CELL CARCINOMA EXCISION  05/2018   lip, 2020 x3 basal cells removed   . CATARACT EXTRACTION  2008   Left  . CATARACT EXTRACTION  2013   Right ([facility])  . COLONOSCOPY  ~2010   medium int hemorrhoids, o/w WNL, rpt 5 yrs given fmhx (Dr. Oletta Lamas)  . exercise treadmill  11/2005   WNL [facility])  . HERNIA REPAIR  2008   Right  . MOHS SURGERY Left spring 2014   basal cell face  . Testicular Biopsy  2003   benign, varicocele  . US ECHOCARDIOGRAPHY  11/2005   aortic sclerosis, EF 02-72%, diastolic dysfunction        Family History  Problem Relation Age of Onset  . Cancer Father 33   prostate  . Coronary artery disease Father 74   MI  . Prostate cancer Father   . Cancer Paternal Grandfather 45   prostate  . Prostate cancer Paternal Grandfather   . Stroke Mother   . Hypertension Mother   . Cancer Mother 73   colon  . Colon cancer Mother   . Coronary artery disease Maternal Uncle   . Cancer Sister 47  breast  . Breast cancer Sister   . Colon cancer Maternal Grandmother   . Diabetes Neg Hx   . Colon polyps Neg Hx   . Esophageal cancer Neg Hx   . Rectal cancer Neg Hx   . Stomach cancer Neg Hx    SOCIAL HISTORY:  Social History        Socioeconomic History  . Marital status: Married    Spouse name: Not on file  . Number of children: Not on file  . Years of education: Not on file  . Highest education level: Not on file  Occupational History  . Not on file  Tobacco Use  . Smoking status: Never Smoker  . Smokeless tobacco: Never Used  Substance and Sexual Activity  . Alcohol use: Yes    Alcohol/week: 0.0 standard drinks    Comment: rare  . Drug use: No  . Sexual activity: Not on file  Other Topics Concern  . Not on file  Social History Narrative    Caffeine: 2-3 cups coffee/day   Lives with wife and adult son, 1 dog   Occupation: Counsellor   Edu: college   Activity: golf, no regular exercise, occasionally walks with wife   Diet: good amt water, daily fruits/vegetables, fish several times a week   Social Determinants of Health      Financial Resource Strain: Low Risk   . Difficulty of Paying Living Expenses: Not hard at all  Food Insecurity: No Food Insecurity  . Worried About Charity fundraiser in the Last Year: Never true  . Ran Out of Food in the Last Year: Never true  Transportation Needs: No Transportation Needs  . Lack of Transportation (Medical): No  . Lack of Transportation (Non-Medical): No  Physical Activity: Inactive  . Days of Exercise per Week: 0 days  . Minutes of Exercise per Session: 0 min  Stress: No Stress Concern Present  . Feeling of Stress : Not at all  Social Connections:   . Frequency of Communication with Friends and Family:   . Frequency of Social Gatherings with Friends and Family:   . Attends Religious Services:   . Active Member of Clubs or Organizations:   . Attends Archivist Meetings:   Marland Kitchen Marital Status:   Intimate Partner Violence: Not At Risk  . Fear of Current or Ex-Partner: No  . Emotionally Abused: No  . Physically Abused: No  . Sexually Abused: No   No Known Allergies        Current Outpatient Medications  Medication Sig Dispense Refill  . aspirin EC 81 MG tablet Take 1 tablet (81 mg total) by mouth daily. 90 tablet 3  . Multiple Vitamin (MULTIVITAMIN) tablet Take 1 tablet by mouth daily.    . rosuvastatin (CRESTOR) 20 MG tablet Take 1 tablet (20 mg total) by mouth daily. 90 tablet 3  . tamsulosin (FLOMAX) 0.4 MG CAPS capsule Take 1 capsule (0.4 mg total) by mouth daily.     No current facility-administered medications for this visit.   REVIEW OF SYSTEMS:  [X]  denotes positive finding, [ ]  denotes negative finding  Cardiac  Comments:  Chest pain or  chest pressure:    Shortness of breath upon exertion:    Short of breath when lying flat:    Irregular heart rhythm:        Vascular    Pain in calf, thigh, or hip brought on by ambulation:    Pain in feet at night  that wakes you up from your sleep:     Blood clot in your veins:    Leg swelling:         Pulmonary    Oxygen at home:    Productive cough:     Wheezing:         Neurologic    Sudden weakness in arms or legs:     Sudden numbness in arms or legs:     Sudden onset of difficulty speaking or slurred speech:    Temporary loss of vision in one eye:     Problems with dizziness:         Gastrointestinal    Blood in stool:     Vomited blood:         Genitourinary    Burning when urinating:     Blood in urine:        Psychiatric    Major depression:         Hematologic    Bleeding problems:    Problems with blood clotting too easily:        Skin    Rashes or ulcers:        Constitutional    Fever or chills:    PHYSICAL EXAM:      Vitals:   10/10/19 1156 10/10/19 1159  BP: 123/67 132/77  Pulse: 63 63  Resp: 18   Temp: 98.1 F (36.7 C)   TempSrc: Temporal   SpO2: 99%   Weight: 193 lb (87.5 kg)   Height: 5\' 9"  (1.753 m)    GENERAL: The patient is a well-nourished male, in no acute distress. The vital signs are documented above.  CARDIAC: There is a regular rate and rhythm.  VASCULAR:  Palpable radial pulses bilaterally  No neck incisions  Good ROM of neck  PULMONARY: There is good air exchange bilaterally without wheezing or rales.  ABDOMEN: Soft and non-tender with normal pitched bowel sounds.  MUSCULOSKELETAL: There are no major deformities or cyanosis.  NEUROLOGIC: No focal weakness or paresthesias are detected. CN II-XII grossly intact.  SKIN: There are no ulcers or rashes noted.  PSYCHIATRIC: The patient has a normal affect.  DATA:  I independently reviewed his carotid duplex from today and agree he has a greater than 80% high-grade stenosis in  the right proximal to mid ICA. There is no significant disease on the left with stenosis ranging from 1 to 39%. Antegrade flow right vert, retrograde left vert.  Assessment/Plan:  74 year old male with history of hyperlipidemia that presents for evaluation of a high-grade asymptomatic right internal carotid artery stenosis. This has been followed since 2016 and has had progression on recent surveillance imaging. I discussed with him that given progression to more than 80% stenosis in the setting of asymptomatic disease we would recommend carotid endarterectomy. We discussed risks and benefits in detail including risk of bleeding, infection, hematoma requiring return to the OR, cranial nerve injury, 1% perioperative stroke risk. I would like to get a CTA neck for operative planning given that his duplex suggest that the lesion extends into the mid ICA and I need to ensure that there is a safe clamp site distally in order to perform endarterectomy. We will go and get him scheduled today and ensure the CTA is done prior to surgery.  Larry Heck, MD  [facility]  Office: [phone]

## 2019-12-01 NOTE — Anesthesia Procedure Notes (Signed)
Arterial Line Insertion Start/End6/09/2019 7:05 AM, 12/01/2019 7:10 AM Performed by: Wilburn Cornelia, CRNA, CRNA  Patient location: Pre-op. Preanesthetic checklist: patient identified, IV checked, risks and benefits discussed, surgical consent, monitors and equipment checked, pre-op evaluation and timeout performed Lidocaine 1% used for infiltration Left, radial was placed Catheter size: 20 G Hand hygiene performed  and maximum sterile barriers used  Allen's test indicative of satisfactory collateral circulation Attempts: 1 Procedure performed without using ultrasound guided technique. Following insertion, dressing applied and Biopatch. Post procedure assessment: normal  Patient tolerated the procedure well with no immediate complications.

## 2019-12-02 LAB — CBC
HCT: 32.3 % — ABNORMAL LOW (ref 39.0–52.0)
Hemoglobin: 11.1 g/dL — ABNORMAL LOW (ref 13.0–17.0)
MCH: 34.6 pg — ABNORMAL HIGH (ref 26.0–34.0)
MCHC: 34.4 g/dL (ref 30.0–36.0)
MCV: 100.6 fL — ABNORMAL HIGH (ref 80.0–100.0)
Platelets: 154 10*3/uL (ref 150–400)
RBC: 3.21 MIL/uL — ABNORMAL LOW (ref 4.22–5.81)
RDW: 13.2 % (ref 11.5–15.5)
WBC: 11.1 10*3/uL — ABNORMAL HIGH (ref 4.0–10.5)
nRBC: 0 % (ref 0.0–0.2)

## 2019-12-02 LAB — BASIC METABOLIC PANEL
Anion gap: 6 (ref 5–15)
BUN: 21 mg/dL (ref 8–23)
CO2: 21 mmol/L — ABNORMAL LOW (ref 22–32)
Calcium: 8.1 mg/dL — ABNORMAL LOW (ref 8.9–10.3)
Chloride: 108 mmol/L (ref 98–111)
Creatinine, Ser: 1 mg/dL (ref 0.61–1.24)
GFR calc Af Amer: >60 mL/min (ref >60)
GFR calc non Af Amer: >60 mL/min (ref >60)
Glucose, Bld: 120 mg/dL — ABNORMAL HIGH (ref 70–99)
Potassium: 4.1 mmol/L (ref 3.5–5.1)
Sodium: 135 mmol/L (ref 135–145)

## 2019-12-02 MED ORDER — HYDROCODONE-ACETAMINOPHEN 5-325 MG PO TABS: 1.0000 | ORAL_TABLET | ORAL | 0 refills | Status: DC | PRN

## 2019-12-02 NOTE — Progress Notes (Addendum)
  Progress Note    12/02/2019 7:05 AM 1 Day Post-Op  Subjective: No complaints this morning.  He reports chronic hoarseness.  He is ambulating well and voiding spontaneously.  Tolerating diet   Vitals:   12/01/19 2345 12/02/19 0456  BP: (!) 108/56 (!) 110/55  Pulse: (!) 58 (!) 59  Resp: 20 16  Temp: 98.8 F (37.1 C) 98.5 F (36.9 C)  SpO2: 93% 93%    Physical Exam: Cardiac: Heart rate and rhythm are regular Lungs: Clear to auscultation bilaterally Incisions: Right neck incision well approximated.  No edema or oozing Extremities: 5 out of 5 upper and lower extremity strength. Abdomen: Soft nondistended Neurologic: Alert and oriented x4.  Tongue is midline.  Face is symmetric.  Speech is fluent CBC    Component Value Date/Time   WBC 11.1 (H) 12/02/2019 0614   RBC 3.21 (L) 12/02/2019 0614   HGB 11.1 (L) 12/02/2019 0614   HCT 32.3 (L) 12/02/2019 0614   PLT 154 12/02/2019 0614   MCV 100.6 (H) 12/02/2019 0614   MCH 34.6 (H) 12/02/2019 0614   MCHC 34.4 12/02/2019 0614   RDW 13.2 12/02/2019 0614    BMET    Component Value Date/Time   NA 136 11/22/2019 1000   K 4.2 11/22/2019 1000   CL 105 11/22/2019 1000   CO2 26 11/22/2019 1000   GLUCOSE 105 (H) 11/22/2019 1000   BUN 13 11/22/2019 1000   CREATININE 1.10 12/01/2019 1338   CALCIUM 9.0 11/22/2019 1000   GFRNONAA >60 12/01/2019 1338   GFRAA >60 12/01/2019 1338     Intake/Output Summary (Last 24 hours) at 12/02/2019 0705 Last data filed at 12/02/2019 0457 Gross per 24 hour  Intake 1400 ml  Output 650 ml  Net 750 ml    HOSPITAL MEDICATIONS Scheduled Meds: . aspirin EC  81 mg Oral Daily  . docusate sodium  100 mg Oral Daily  . heparin  5,000 Units Subcutaneous Q8H  . pantoprazole  40 mg Oral Daily  . rosuvastatin  20 mg Oral Daily  . tamsulosin  0.4 mg Oral Daily   Continuous Infusions: . sodium chloride    . sodium chloride 50 mL/hr at 12/01/19 1445  . magnesium sulfate bolus IVPB     PRN Meds:.sodium  chloride, acetaminophen **OR** acetaminophen, alum & mag hydroxide-simeth, guaiFENesin-dextromethorphan, hydrALAZINE, labetalol, magnesium sulfate bolus IVPB, metoprolol tartrate, morphine injection, ondansetron, oxyCODONE, phenol, potassium chloride, senna-docusate, zolpidem  Assessment: Status post right carotid endarterectomy   history of asymptomatic high-grade carotid stenosis.  Hemodynamically stable.  Neurologically intact    Plan: -Discharge home -DVT prophylaxis:  Heparin Mayo   Risa Grill, PA-C [facility] [phone] 12/02/2019  7:05 AM   Agree with above. No neck hematoma.  Cranial nerves intact. UELE motor intact no facial asymmetry  D/c home  Ruta Hinds, MD [facility] [place] Office: [phone]

## 2019-12-02 NOTE — Discharge Summary (Signed)
Discharge Summary     Larry Kent 1946/02/24 74 y.o. male  [id number]  Admission Date: 12/01/2019  Discharge Date: 12/02/2019 Physician: [name], MD  Admission Diagnosis: Carotid stenosis, asymptomatic, right [I65.21]   HPI:   This is a 74 y.o. male who presents with right asymptomatic high grade carotid stenosis with likely ulcerated plaque.  He was counseled regarding need for right carotid endarterectomy.  Hospital Course:  The patient was admitted to the hospital and taken to the operating room on 12/01/2019 and underwent right carotid endarterectomy.  He tolerated the procedure well  Findings: 1.  High grade ulcerated right carotid plaque. 2.  Continuous Doppler audible flow signatures are appropriate for each carotid artery after endarterectomy and patch angioplasty.  The pt tolerated the procedure well and was transported to the PACU in excellent condition.   By POD 1, the pt neuro status intact.  His vital signs remained stable.  Cranial nerves intact.  Right neck incision without hematoma.  No extremity weakness, swallowing without difficulty.  Speech fluent  The remainder of the hospital course consisted of increasing mobilization and increasing intake of solids without difficulty.   Recent Labs    12/02/19 0614  NA 135  K 4.1  CL 108  CO2 21*  GLUCOSE 120*  BUN 21  CALCIUM 8.1*   Recent Labs    12/01/19 1338 12/02/19 0614  WBC 7.4 11.1*  HGB 12.9* 11.1*  HCT 37.9* 32.3*  PLT 164 154   No results for input(s): INR in the last 72 hours.   Discharge Instructions    Discharge patient   Complete by: As directed    Discharge disposition: 01-Home or Self Care   Discharge patient date: 12/01/2019      Discharge Diagnosis:  Carotid stenosis, asymptomatic, right [I65.21]  Secondary Diagnosis: Patient Active Problem List   Diagnosis Date Noted  . Carotid stenosis, asymptomatic, right 12/01/2019  . Cervical radiculopathy 02/08/2018    . Aortic valve sclerosis 07/01/2016  . Nonspecific abnormal electrocardiogram (ECG) (EKG) 07/01/2016  . Obesity, Class I, BMI 30-34.9 06/10/2015  . Carotid stenosis 05/30/2015  . Occlusion of left vertebral artery 05/30/2015  . Advanced care planning/counseling discussion 05/23/2014  . Prediabetes 05/23/2014  . Osteoarthritis of right knee 05/23/2014  . Health maintenance examination 05/23/2014  . Medicare annual wellness visit, subsequent 02/12/2012  . HLD (hyperlipidemia) 02/12/2012  . Benign prostatic hyperplasia   . FHx: prostate cancer   . FHx: colon cancer    Past Medical History:  Diagnosis Date  . Allergy   . Arthritis    both knees   . BCC (basal cell carcinoma), arm, right 09/2019   MOHS ([facility])  . BCC (basal cell carcinoma), face 2014   L preauricular s/p MOHS  . BCC (basal cell carcinoma), face 02/2018   L upper lip  . BPH (benign prostatic hypertrophy)    Dr. Junious Silk @ [facility]  . Carotid stenosis 82/5053   RICA 97-67%, LICA 3-41%, L vertebral occlusion, rpt 1 yr  . Cataract    removed both eyes   . FHx: colon cancer   . FHx: prostate cancer   . Heart murmur    mild aortic stenosis   . Hyperlipidemia    borderline- on rosuvastatin now normal   . Nephrolithiasis 09/2012  . Occlusion of right vertebral artery 05/2015    Allergies as of 12/02/2019   No Known Allergies     Medication List    TAKE these medications  aspirin EC 81 MG tablet Take 1 tablet (81 mg total) by mouth daily.   HYDROcodone-acetaminophen 5-325 MG tablet Commonly known as: NORCO/VICODIN Take 1 tablet by mouth every 4 (four) hours as needed for moderate pain.   multivitamin tablet Take 1 tablet by mouth daily.   rosuvastatin 20 MG tablet Commonly known as: CRESTOR Take 1 tablet (20 mg total) by mouth daily.   tamsulosin 0.4 MG Caps capsule Commonly known as: FLOMAX Take 1 capsule (0.4 mg total) by mouth daily.        [facility] Discharge Instructions Carotid Endarterectomy (CEA)  Please refer to the following instructions for your post-procedure care. Your surgeon or physician assistant will discuss any changes with you.  Activity  You are encouraged to walk as much as you can. You can slowly return to normal activities but must avoid strenuous activity and heavy lifting until your doctor tell you it's OK. Avoid activities such as vacuuming or swinging a golf club. You can drive after one week if you are comfortable and you are no longer taking prescription pain medications. It is normal to feel tired for serval weeks after your surgery. It is also normal to have difficulty with sleep habits, eating, and bowel movements after surgery. These will go away with time.  Bathing/Showering  You may shower after you come home. Do not soak in a bathtub, hot tub, or swim until the incision heals completely.  Incision Care  Shower every day. Clean your incision with mild soap and water. Pat the area dry with a clean towel. You do not need a bandage unless otherwise instructed. Do not apply any ointments or creams to your incision. You may have skin glue on your incision. Do not peel it off. It will come off on its own in about one week. Your incision may feel thickened and raised for several weeks after your surgery. This is normal and the skin will soften over time. For Men Only: It's OK to shave around the incision but do not shave the incision itself for 2 weeks. It is common to have numbness under your chin that could last for several months.  Diet  Resume your normal diet. There are no special food restrictions following this procedure. A low fat/low cholesterol diet is recommended for all patients with vascular disease. In order to heal from your surgery, it is CRITICAL to get adequate nutrition. Your body requires vitamins, minerals, and protein. Vegetables are the best source of vitamins and minerals. Vegetables  also provide the perfect balance of protein. Processed food has little nutritional value, so try to avoid this.  Medications  Resume taking all of your medications unless your doctor or physician assistant tells you not to.  If your incision is causing pain, you may take over-the- counter pain relievers such as acetaminophen (Tylenol). If you were prescribed a stronger pain medication, please be aware these medications can cause nausea and constipation.  Prevent nausea by taking the medication with a snack or meal. Avoid constipation by drinking plenty of fluids and eating foods with a high amount of fiber, such as fruits, vegetables, and grains.  Do not take Tylenol if you are taking prescription pain medications.  Follow Up  Our office will schedule a follow up appointment 2-3 weeks following discharge.  Please call us immediately for any of the following conditions  . Increased pain, redness, drainage (pus) from your incision site. . Fever of 101 degrees or higher. Marland Kitchen  If you should develop stroke (slurred speech, difficulty swallowing, weakness on one side of your body, loss of vision) you should call 911 and go to the nearest emergency room. .  Reduce your risk of vascular disease:  . Stop smoking. If you would like help call [facility] at 1-800-QUIT-NOW 8171563418) or [place] at 2510684808. . Manage your cholesterol . Maintain a desired weight . Control your diabetes . Keep your blood pressure down .  If you have any questions, please call the office at [phone].  Prescriptions given: 1.   Roxicet #10 No Refill  Disposition: Home  Patient's condition: is Excellent  Follow up: 1. Dr. Carlis Abbott in 2 weeks.   Risa Grill, PA-C [facility] [phone]   --- For [facility] use ---   Modified Rankin score at D/C (0-6): 0  IV medication needed for:  1. Hypertension: No 2. Hypotension: No  Post-op Complications: No  1. Post-op CVA or  TIA: No  If yes: Event classification (right eye, left eye, right cortical, left cortical, verterobasilar, other):   If yes: Timing of event (intra-op, <6 hrs post-op, >=6 hrs post-op, unknown):   2. CN injury: No  If yes: CN n/a injuried   3. Myocardial infarction: No  If yes: Dx by (EKG or clinical, Troponin):   4.  CHF: No  5.  Dysrhythmia (new): No  6. Wound infection: No  7. Reperfusion symptoms: No  8. Return to OR: No  If yes: return to OR for (bleeding, neurologic, other CEA incision, other):   Discharge medications: Statin use:  Yes ASA use:  Yes   Beta blocker use:  No ACE-Inhibitor use:  No  ARB use:  No CCB use: No P2Y12 Antagonist use: No, [ ]  Plavix, [ ]  Plasugrel, [ ]  Ticlopinine, [ ]  Ticagrelor, [ ]  Other, [ ]  No for medical reason, [ ]  Non-compliant, [ ]  Not-indicated Anti-coagulant use:  No, [ ]  Warfarin, [ ]  Rivaroxaban, [ ]  Dabigatran,

## 2019-12-02 NOTE — Progress Notes (Signed)
Discharged to home with family office visits in place teaching done  

## 2019-12-04 ENCOUNTER — Encounter: Payer: Self-pay | Admitting: *Deleted

## 2019-12-05 ENCOUNTER — Other Ambulatory Visit: Payer: Self-pay | Admitting: *Deleted

## 2019-12-05 ENCOUNTER — Encounter: Payer: Self-pay | Admitting: *Deleted

## 2019-12-05 NOTE — Patient Outreach (Addendum)
[facility]) Care Management [facility] CM Telephone Outreach, EMMI red Alert notification/ General discharge PCP completes Transition of Care follow up post-hospital discharge Post-hospital discharge day # 3  12/05/2019  Larry Kent Oct 01, 1945 [id number]  EMMI Red-Alert notification/ General Discharge EMMI call date/ day #: Monday December 02, 2019; day # 1 Red-Alert reason(s):  "No scheduled follow up"  Unsuccessful initial outreach attempt to Maree [place], 74 y/o male referred to [facility] RN CM this morning by [facility] CMA for EMMI red-alert notification as above.  Patient had recent planned/ elective surgery for (R) carotid stenosis/ carotid endarterectomy and was hospitalized June 4-5, 2021.  He was discharged home to self-care without home health services in place.  Patient has history including, but not limited to, HLD; BPH; carotid stenosis.  Left patient HIPAA compliant voice message and he immediately returned my call; HIPAA/ identity verified and purpose of call/ [facility] CM services/ program was discussed with patient.  Patient declines ongoing [facility] CM follow up stating no needs; he agrees to complete EMMI screening call; no concerns were identified as a result of screening call today.  Patient reports doing well post-hospital discharge and confirms that after he accepted EMMI automated phone call yesterday, he received a call form his surgeon, scheduling post-hospital follow up office visit; he verbalizes accurate report of scheduled appointment with plans to attend as scheduled.  Patient continues to work part time and drives self; he is out of work temporarily as he recuperates from surgery but plans to resume work as soon as he receives clearance to do so.  He denies pain, new/ recent falls and sounds to be in no distress throughout call today.  Patient further reports: -- self-manages medications and has no medication concerns; verbalizes a good general understanding of the purpose,  dosing and scheduling of all medications and denies issues with affording medications; reports has not needed to take narcotic pain medication post- recent surgery; states occasionally using tylenol for pain, which "helps"  ---- Patient was recently discharged from hospital and all medications were reviewed with patient during phone call today  -- Denies community resource needs stating independent in ADL's and iADL's; reports strong family support network; wife and adult son are able to assist in care needs as/ if indicated ---- SDOH completed for: depression/ transportation/ food insecurity and no concerns were identified -- has Advanced Directives for HCPOA only- declines desire to make changes in same/ create Living Will; endorses self as full code  Patient denies further issues, concerns, or problems today.  I provided/ confirmed patient with my direct phone number, the main [facility] CM office phone number, and the [facility] CM 24-hour nurse advice phone number should he wish to participate in [facility] CM services/ program in the future.  Plan:  Will make patient inactive with [facility] CM program and make patient's PCP aware of same, as no care management/ coordination or disease management needs were identified during screening call today and patient has declined participation in program  Oneta Rack, [place], BSN, [employer] Coordinator [facility] Care Management  [phone]

## 2019-12-14 ENCOUNTER — Telehealth: Payer: Self-pay

## 2019-12-14 DIAGNOSIS — H9319 Tinnitus, unspecified ear: Secondary | ICD-10-CM

## 2019-12-14 NOTE — Telephone Encounter (Signed)
I spoke with pt and for several months pt has had a ringing in ears and has not seen Dr Darnell Level about this. Pt scheduled appt with Dr Darnell Level on 12/15/19 at 10:15.

## 2019-12-14 NOTE — Telephone Encounter (Signed)
Doesn't need appt - ENT referral placed.

## 2019-12-14 NOTE — Telephone Encounter (Signed)
[facility] Night - Client Nonclinical Telephone Record AccessNurse Client [place] [facility] - Client Client Site [place] Physician Ria Bush - MD Contact Type Call Who Is Calling Patient / Member / Family / Caregiver Caller Name [place] Phone Number [phone] Patient Name Larry Kent Patient DOB 05-Aug-1945 Call Type Message Only Information Provided Reason for Call Request for General Office Information Initial Comment Caller states he having ringing in the ears. He would like a referral for an ent. Additional Comment Disp. Time Disposition Final User 12/13/2019 5:01:31 PM General Information Provided Yes Paulita Cradle Call Closed By: Paulita Cradle Transaction Date/Time: 12/13/2019 4:58:12 PM (ET)

## 2019-12-14 NOTE — Telephone Encounter (Addendum)
Lvm asking pt to call back.  Need to relay Dr. Synthia Innocent message. C/x OV.

## 2019-12-15 ENCOUNTER — Ambulatory Visit: Payer: Medicare Other | Admitting: Family Medicine

## 2019-12-15 NOTE — Telephone Encounter (Signed)
Patient returned your call  Advised of Dr Synthia Innocent message and he voiced understanding.

## 2019-12-19 ENCOUNTER — Encounter: Payer: Self-pay | Admitting: Vascular Surgery

## 2019-12-19 ENCOUNTER — Ambulatory Visit (INDEPENDENT_AMBULATORY_CARE_PROVIDER_SITE_OTHER): Payer: Self-pay | Admitting: Vascular Surgery

## 2019-12-19 ENCOUNTER — Other Ambulatory Visit: Payer: Self-pay

## 2019-12-19 VITALS — BP 132/79 | HR 66 | Temp 97.5°F | Resp 20 | Ht 69.0 in | Wt 188.8 lb

## 2019-12-19 DIAGNOSIS — I6521 Occlusion and stenosis of right carotid artery: Secondary | ICD-10-CM

## 2019-12-19 NOTE — Progress Notes (Signed)
Patient name: Larry Kent MRN: [id number] DOB: [birth year] Sex: male  REASON FOR VISIT: Postop check after right carotid endarterectomy  HPI: Larry Kent is a 74 y.o. male that presents for postop check after right carotid endarterectomy on 12/01/2019 for an asymptomatic high-grade stenosis.  He has recovered well at home has had no new neurologic events since discharge.  Has a little bit of tenderness at the neck incision but no other swelling redness drainage etc.  He is back to work and driving.  No other specific concerns.  Past Medical History:  Diagnosis Date  . Allergy   . Arthritis    both knees   . BCC (basal cell carcinoma), arm, right 09/2019   MOHS ([facility])  . BCC (basal cell carcinoma), face 2014   L preauricular s/p MOHS  . BCC (basal cell carcinoma), face 02/2018   L upper lip  . BPH (benign prostatic hypertrophy)    Dr. Junious Silk @ [facility]  . Carotid stenosis 49/6759   RICA 16-38%, LICA 4-66%, L vertebral occlusion, rpt 1 yr  . Cataract    removed both eyes   . FHx: colon cancer   . FHx: prostate cancer   . Heart murmur    mild aortic stenosis   . Hyperlipidemia    borderline- on rosuvastatin now normal   . Nephrolithiasis 09/2012  . Occlusion of right vertebral artery 05/2015    Past Surgical History:  Procedure Laterality Date  . BASAL CELL CARCINOMA EXCISION  05/2018   lip, 2020 x3  basal cells removed   . CATARACT EXTRACTION  2008   Left  . CATARACT EXTRACTION  2013   Right ([place])  . COLONOSCOPY  ~2010   medium int hemorrhoids, o/w WNL, rpt 5 yrs given fmhx (Dr. Oletta Lamas)  . ENDARTERECTOMY Right 12/01/2019   Procedure: RIGHT ENDARTERECTOMY CAROTID;  Surgeon: [name], MD;  Location: [facility];  Service: Vascular;  Laterality: Right;  . exercise treadmill  11/2005   WNL [place])  . HERNIA REPAIR  2008   Right  . MOHS SURGERY Left spring 2014   basal cell face  . PATCH ANGIOPLASTY Right 12/01/2019   Procedure: PATCH ANGIOPLASTY USING  [place];  Surgeon: [name], MD;  Location: [facility];  Service: Vascular;  Laterality: Right;  . Testicular Biopsy  2003   benign, varicocele  . US ECHOCARDIOGRAPHY  11/2005   aortic sclerosis, EF 59-93%, diastolic dysfunction    Family History  Problem Relation Age of Onset  . Cancer Father 25       prostate  . Coronary artery disease Father 46       MI  . Prostate cancer Father   . Cancer Paternal Grandfather 88       prostate  . Prostate cancer Paternal Grandfather   . Stroke Mother   . Hypertension Mother   . Cancer Mother 66       colon  . Colon cancer Mother   . Coronary artery disease Maternal Uncle   . Cancer Sister 65       breast  . Breast cancer Sister   . Colon cancer Maternal Grandmother   . Diabetes Neg Hx   . Colon polyps Neg Hx   . Esophageal cancer Neg Hx   . Rectal cancer Neg Hx   . Stomach cancer Neg Hx     SOCIAL HISTORY: Social History   Tobacco Use  . Smoking status: Never Smoker  . Smokeless tobacco:  Never Used  Substance Use Topics  . Alcohol use: Yes    Alcohol/week: 0.0 standard drinks    Comment: rare    No Known Allergies  Current Outpatient Medications  Medication Sig Dispense Refill  . aspirin EC 81 MG tablet Take 1 tablet (81 mg total) by mouth daily. 90 tablet 3  . Multiple Vitamin (MULTIVITAMIN) tablet Take 1 tablet by mouth daily.    . tamsulosin (FLOMAX) 0.4 MG CAPS capsule Take 1 capsule (0.4 mg total) by mouth daily.    . rosuvastatin (CRESTOR) 20 MG tablet Take 1 tablet (20 mg total) by mouth daily. 90 tablet 3   No current facility-administered medications for this visit.    REVIEW OF SYSTEMS:  [X]  denotes positive finding, [ ]  denotes negative finding Cardiac  Comments:  Chest pain or chest pressure:    Shortness of breath upon exertion:    Short of breath when lying flat:    Irregular heart rhythm:        Vascular    Pain in calf, thigh, or hip brought on by ambulation:    Pain in feet  at night that wakes you up from your sleep:     Blood clot in your veins:    Leg swelling:         Pulmonary    Oxygen at home:    Productive cough:     Wheezing:         Neurologic    Sudden weakness in arms or legs:     Sudden numbness in arms or legs:     Sudden onset of difficulty speaking or slurred speech:    Temporary loss of vision in one eye:     Problems with dizziness:         Gastrointestinal    Blood in stool:     Vomited blood:         Genitourinary    Burning when urinating:     Blood in urine:        Psychiatric    Major depression:         Hematologic    Bleeding problems:    Problems with blood clotting too easily:        Skin    Rashes or ulcers:        Constitutional    Fever or chills:      PHYSICAL EXAM: Vitals:   12/19/19 1553  BP: 132/79  Pulse: 66  Resp: 20  Temp: (!) 97.5 F (36.4 C)  TempSrc: Temporal  SpO2: 99%  Weight: 188 lb 12.8 oz (85.6 kg)  Height: 5\' 9"  (1.753 m)    GENERAL: The patient is a well-nourished male, in no acute distress. The vital signs are documented above. CARDIAC: There is a regular rate and rhythm.  VASCULAR:  Right neck incision well-healed, no hematoma MUSCULOSKELETAL: There are no major deformities or cyanosis. NEUROLOGIC: No focal weakness or paresthesias are detected.  Cranial nerves II through XII grossly intact.   DATA:   None  Assessment/Plan:  74 year old male status post right carotid endarterectomy on 12/01/2019 for an asymptomatic high-grade stenosis.  He has recovered well from surgery and his incision is healing without issue.  Instructed that he stay on aspirin and statin.  He has no significant contralateral carotid disease at this time and only 1-39% stenosis on duplex.  Would recommend follow-up again in 9 months with carotid duplex for ongoing surveillance.   [name], MD [facility] [place] Office: [phone]

## 2019-12-21 ENCOUNTER — Other Ambulatory Visit: Payer: Self-pay | Admitting: *Deleted

## 2019-12-21 DIAGNOSIS — I6529 Occlusion and stenosis of unspecified carotid artery: Secondary | ICD-10-CM

## 2019-12-26 DIAGNOSIS — H9319 Tinnitus, unspecified ear: Secondary | ICD-10-CM | POA: Diagnosis not present

## 2019-12-26 DIAGNOSIS — H903 Sensorineural hearing loss, bilateral: Secondary | ICD-10-CM | POA: Diagnosis not present

## 2019-12-26 DIAGNOSIS — J301 Allergic rhinitis due to pollen: Secondary | ICD-10-CM | POA: Diagnosis not present

## 2019-12-26 DIAGNOSIS — H6980 Other specified disorders of Eustachian tube, unspecified ear: Secondary | ICD-10-CM | POA: Diagnosis not present

## 2019-12-26 DIAGNOSIS — H90A32 Mixed conductive and sensorineural hearing loss, unilateral, left ear with restricted hearing on the contralateral side: Secondary | ICD-10-CM | POA: Diagnosis not present

## 2020-01-23 ENCOUNTER — Other Ambulatory Visit: Payer: Self-pay | Admitting: Physician Assistant

## 2020-01-23 DIAGNOSIS — H9319 Tinnitus, unspecified ear: Secondary | ICD-10-CM | POA: Diagnosis not present

## 2020-01-23 DIAGNOSIS — IMO0001 Reserved for inherently not codable concepts without codable children: Secondary | ICD-10-CM

## 2020-01-23 DIAGNOSIS — H6980 Other specified disorders of Eustachian tube, unspecified ear: Secondary | ICD-10-CM | POA: Diagnosis not present

## 2020-01-23 DIAGNOSIS — H90A22 Sensorineural hearing loss, unilateral, left ear, with restricted hearing on the contralateral side: Secondary | ICD-10-CM | POA: Diagnosis not present

## 2020-01-23 DIAGNOSIS — J301 Allergic rhinitis due to pollen: Secondary | ICD-10-CM | POA: Diagnosis not present

## 2020-02-08 ENCOUNTER — Other Ambulatory Visit: Payer: Self-pay

## 2020-02-08 ENCOUNTER — Ambulatory Visit
Admission: RE | Admit: 2020-02-08 | Discharge: 2020-02-08 | Disposition: A | Discharge status: Home / Self Care | Payer: Medicare Other | Source: Ambulatory Visit | Attending: Physician Assistant | Admitting: Physician Assistant

## 2020-02-08 DIAGNOSIS — I616 Nontraumatic intracerebral hemorrhage, multiple localized: Secondary | ICD-10-CM | POA: Diagnosis not present

## 2020-02-08 DIAGNOSIS — I6782 Cerebral ischemia: Secondary | ICD-10-CM | POA: Diagnosis not present

## 2020-02-08 DIAGNOSIS — G9389 Other specified disorders of brain: Secondary | ICD-10-CM | POA: Diagnosis not present

## 2020-02-08 DIAGNOSIS — IMO0001 Reserved for inherently not codable concepts without codable children: Secondary | ICD-10-CM

## 2020-02-08 DIAGNOSIS — H9042 Sensorineural hearing loss, unilateral, left ear, with unrestricted hearing on the contralateral side: Secondary | ICD-10-CM

## 2020-02-08 DIAGNOSIS — J323 Chronic sphenoidal sinusitis: Secondary | ICD-10-CM | POA: Diagnosis not present

## 2020-02-08 MED ORDER — GADOBUTROL 1 MMOL/ML IV SOLN
7.5000 mL | Freq: Once | INTRAVENOUS | Status: AC | PRN
  Administered 2020-02-08: 7.5 mL via INTRAVENOUS

## 2020-02-20 DIAGNOSIS — I6782 Cerebral ischemia: Secondary | ICD-10-CM | POA: Diagnosis not present

## 2020-02-20 DIAGNOSIS — J320 Chronic maxillary sinusitis: Secondary | ICD-10-CM | POA: Diagnosis not present

## 2020-02-20 DIAGNOSIS — H90A22 Sensorineural hearing loss, unilateral, left ear, with restricted hearing on the contralateral side: Secondary | ICD-10-CM | POA: Diagnosis not present

## 2020-02-22 ENCOUNTER — Encounter: Payer: Self-pay | Admitting: Family Medicine

## 2020-02-22 DIAGNOSIS — I679 Cerebrovascular disease, unspecified: Secondary | ICD-10-CM | POA: Insufficient documentation

## 2020-02-22 DIAGNOSIS — H90A22 Sensorineural hearing loss, unilateral, left ear, with restricted hearing on the contralateral side: Secondary | ICD-10-CM | POA: Insufficient documentation

## 2020-02-27 ENCOUNTER — Ambulatory Visit: Payer: Medicare Other | Admitting: Cardiovascular Disease

## 2020-03-05 ENCOUNTER — Ambulatory Visit: Payer: Medicare Other | Admitting: Cardiovascular Disease

## 2020-03-05 ENCOUNTER — Encounter: Payer: Self-pay | Admitting: Cardiovascular Disease

## 2020-03-05 ENCOUNTER — Other Ambulatory Visit: Payer: Self-pay

## 2020-03-05 VITALS — BP 106/80 | HR 62 | Ht 69.0 in | Wt 183.0 lb

## 2020-03-05 DIAGNOSIS — I779 Disorder of arteries and arterioles, unspecified: Secondary | ICD-10-CM

## 2020-03-05 DIAGNOSIS — I359 Nonrheumatic aortic valve disorder, unspecified: Secondary | ICD-10-CM | POA: Diagnosis not present

## 2020-03-05 DIAGNOSIS — E785 Hyperlipidemia, unspecified: Secondary | ICD-10-CM | POA: Diagnosis not present

## 2020-03-05 NOTE — Progress Notes (Signed)
Cardiology Office Note   Date:  03/05/2020   ID:  Larry Kent, DOB February 08, 1946, MRN [id number]  PCP:  Ria Bush, MD  Cardiologist:   Kathlyn Sacramento, MD   No chief complaint on file.     History of Present Illness: Larry Kent is a 74 y.o. male who is here today for a follow-up visit regarding carotid disease and  mild aortic stenosis.    The patient has known history of hyperlipidemia but no previous cardiac history.  He is not a smoker and does not have diabetes.  There is no family history of premature coronary artery disease although his father did have myocardial infarction at the age of 31. The patient has been followed for right carotid stenosis that has progressed gradually since 2016.  Most recent Doppler in March of this year showed significant progression to greater than 80% and thus he was referred to vascular surgery.  He underwent right carotid endarterectomy in June without complications. Patient has known history of mild aortic stenosis.  Echo in February 2020 showed normal LV systolic function, moderately calcified aortic valve with mild stenosis and mean gradient of 8 mmHg.  The ascending aorta was mildly dilated at 37 mm.  He has been doing well with no recent chest pain, shortness of breath or palpitations.  He does complain of problems with memory and he is supposed to see neurology in the near future for evaluation.  MRI of the brain did show small vessel disease.   Past Medical History:  Diagnosis Date  . Allergy   . Arthritis    both knees   . BCC (basal cell carcinoma), arm, right 09/2019   MOHS ([place])  . BCC (basal cell carcinoma), face 2014   L preauricular s/p MOHS  . BCC (basal cell carcinoma), face 02/2018   L upper lip  . BPH (benign prostatic hypertrophy)    Dr. Junious Silk @ [facility]  . Carotid stenosis 20/3559   RICA 74-16%, LICA 3-84%, L vertebral occlusion, rpt 1 yr  . Cataract    removed both eyes   . FHx: colon cancer   .  FHx: prostate cancer   . Heart murmur    mild aortic stenosis   . Hyperlipidemia    borderline- on rosuvastatin now normal   . Nephrolithiasis 09/2012  . Occlusion of right vertebral artery 05/2015    Past Surgical History:  Procedure Laterality Date  . BASAL CELL CARCINOMA EXCISION  05/2018   lip, 2020 x3  basal cells removed   . CATARACT EXTRACTION  2008   Left  . CATARACT EXTRACTION  2013   Right ([place])  . COLONOSCOPY  ~2010   medium int hemorrhoids, o/w WNL, rpt 5 yrs given fmhx (Dr. Oletta Lamas)  . ENDARTERECTOMY Right 12/01/2019   Procedure: RIGHT ENDARTERECTOMY CAROTID;  Surgeon: [name], MD;  Location: [place];  Service: Vascular;  Laterality: Right;  . exercise treadmill  11/2005   WNL [place])  . HERNIA REPAIR  2008   Right  . MOHS SURGERY Left spring 2014   basal cell face  . PATCH ANGIOPLASTY Right 12/01/2019   Procedure: PATCH ANGIOPLASTY USING [place];  Surgeon: [name], MD;  Location: [place];  Service: Vascular;  Laterality: Right;  . Testicular Biopsy  2003   benign, varicocele  . US ECHOCARDIOGRAPHY  11/2005   aortic sclerosis, EF 53-64%, diastolic dysfunction     Current Outpatient Medications  Medication Sig Dispense Refill  .  aspirin EC 81 MG tablet Take 1 tablet (81 mg total) by mouth daily. 90 tablet 3  . fluticasone (FLONASE) 50 MCG/ACT nasal spray Place 1 spray into both nostrils 2 (two) times daily.    . Multiple Vitamin (MULTIVITAMIN) tablet Take 1 tablet by mouth daily.    . tamsulosin (FLOMAX) 0.4 MG CAPS capsule Take 1 capsule (0.4 mg total) by mouth daily.    . rosuvastatin (CRESTOR) 20 MG tablet Take 1 tablet (20 mg total) by mouth daily. 90 tablet 3   No current facility-administered medications for this visit.    Allergies:   Patient has no known allergies.    Social History:  The patient  reports that he has never smoked. He has never used smokeless tobacco. He reports current alcohol use. He reports  that he does not use drugs.   Family History:  The patient's family history includes Breast cancer in his sister; Cancer (age of onset: 55) in his sister; Cancer (age of onset: 34) in his paternal grandfather; Cancer (age of onset: 7) in his father; Cancer (age of onset: 95) in his mother; Colon cancer in his maternal grandmother and mother; Coronary artery disease in his maternal uncle; Coronary artery disease (age of onset: 96) in his father; Hypertension in his mother; Prostate cancer in his father and paternal grandfather; Stroke in his mother.    ROS:  Please see the history of present illness.   Otherwise, review of systems are positive for none.   All other systems are reviewed and negative.    PHYSICAL EXAM: VS:  BP 106/80   Pulse 62   Ht 5\' 9"  (1.753 m)   Wt 183 lb (83 kg)   SpO2 97%   BMI 27.02 kg/m  , BMI Body mass index is 27.02 kg/m. GEN: Well nourished, well developed, in no acute distress  HEENT: normal  Neck: no JVD or masses.  Left carotid bruit.  Right carotid endarterectomy scar is well-healed. Cardiac: RRR; no  rubs, or gallops,no edema .  2 / 6 systolic murmur in the aortic area. Respiratory:  clear to auscultation bilaterally, normal work of breathing GI: soft, nontender, nondistended, + BS MS: no deformity or atrophy  Skin: warm and dry, no rash Neuro:  Strength and sensation are intact Psych: euthymic mood, full affect   EKG:  EKG is not ordered today.  Recent Labs: 11/22/2019: ALT 30 12/02/2019: BUN 21; Creatinine, Ser 1.00; Hemoglobin 11.1; Platelets 154; Potassium 4.1; Sodium 135    Lipid Panel    Component Value Date/Time   CHOL 126 07/14/2019 0844   CHOL 128 09/12/2018 1118   TRIG 65.0 07/14/2019 0844   HDL 56.30 07/14/2019 0844   HDL 57 09/12/2018 1118   CHOLHDL 2 07/14/2019 0844   VLDL 13.0 07/14/2019 0844   LDLCALC 56 07/14/2019 0844   LDLCALC 57 09/12/2018 1118   LDLDIRECT 144.8 02/09/2012 0928      Wt Readings from Last 3 Encounters:   03/05/20 183 lb (83 kg)  12/19/19 188 lb 12.8 oz (85.6 kg)  12/01/19 190 lb (86.2 kg)       PAD Screen 08/02/2018  Previous PAD dx? No  Previous surgical procedure? No  Pain with walking? No  Feet/toe relief with dangling? No  Painful, non-healing ulcers? No  Extremities discolored? No      ASSESSMENT AND PLAN:  1.  Carotid artery disease: Status post right carotid endarterectomy June of this year.  We will plan on repeat carotid Doppler early  next year.  No significant disease affecting the left side.  2.  Hyperlipidemia: Lipid profile has been well controlled since he was switched to rosuvastatin with most recent LDL of 56 and triglyceride of 65.  3.  Aortic stenosis: This was mild on echocardiogram last year.  The heart murmur does not seem to be different.  I plan a repeat echocardiogram in 2022.    4.  Early memory loss: I agree with neurology evaluation.   Disposition:   FU with me in 6 months  Signed,  Kathlyn Sacramento, MD  03/05/2020 5:29 PM    [facility]

## 2020-03-05 NOTE — Patient Instructions (Signed)
Medication Instructions:  The current medical regimen is effective;  continue present plan and medications.  *If you need a refill on your cardiac medications before your next appointment, please call your pharmacy*   Follow-Up: At [facility], you and your health needs are our priority.  As part of our continuing mission to provide you with exceptional heart care, we have created designated Provider Care Teams.  These Care Teams include your primary Cardiologist (physician) and Advanced Practice Providers (APPs -  Physician Assistants and Nurse Practitioners) who all work together to provide you with the care you need, when you need it.  We recommend signing up for the patient portal called "MyChart".  Sign up information is provided on this After Visit Summary.  MyChart is used to connect with patients for Virtual Visits (Telemedicine).  Patients are able to view lab/test results, encounter notes, upcoming appointments, etc.  Non-urgent messages can be sent to your provider as well.   To learn more about what you can do with MyChart, go to [email or web address].    Your next appointment:   6 month(s)  The format for your next appointment:   In Person  Provider:   Kathlyn Sacramento, MD

## 2020-04-11 DIAGNOSIS — H9192 Unspecified hearing loss, left ear: Secondary | ICD-10-CM | POA: Diagnosis not present

## 2020-04-11 DIAGNOSIS — I6521 Occlusion and stenosis of right carotid artery: Secondary | ICD-10-CM | POA: Diagnosis not present

## 2020-04-11 DIAGNOSIS — H9312 Tinnitus, left ear: Secondary | ICD-10-CM | POA: Diagnosis not present

## 2020-05-03 DIAGNOSIS — H04123 Dry eye syndrome of bilateral lacrimal glands: Secondary | ICD-10-CM | POA: Diagnosis not present

## 2020-06-14 ENCOUNTER — Other Ambulatory Visit: Payer: Self-pay | Admitting: Family Medicine

## 2020-07-28 ENCOUNTER — Encounter: Payer: Self-pay | Admitting: Family Medicine

## 2020-08-28 ENCOUNTER — Other Ambulatory Visit: Payer: Self-pay | Admitting: Family Medicine

## 2020-09-08 ENCOUNTER — Other Ambulatory Visit: Payer: Self-pay | Admitting: Family Medicine

## 2020-09-08 DIAGNOSIS — R7303 Prediabetes: Secondary | ICD-10-CM

## 2020-09-08 DIAGNOSIS — E785 Hyperlipidemia, unspecified: Secondary | ICD-10-CM

## 2020-09-08 DIAGNOSIS — D649 Anemia, unspecified: Secondary | ICD-10-CM

## 2020-09-08 DIAGNOSIS — N401 Enlarged prostate with lower urinary tract symptoms: Secondary | ICD-10-CM

## 2020-09-08 DIAGNOSIS — R351 Nocturia: Secondary | ICD-10-CM

## 2020-09-09 ENCOUNTER — Other Ambulatory Visit: Payer: Self-pay

## 2020-09-09 ENCOUNTER — Other Ambulatory Visit (INDEPENDENT_AMBULATORY_CARE_PROVIDER_SITE_OTHER): Payer: Medicare Other

## 2020-09-09 DIAGNOSIS — N401 Enlarged prostate with lower urinary tract symptoms: Secondary | ICD-10-CM

## 2020-09-09 DIAGNOSIS — R7303 Prediabetes: Secondary | ICD-10-CM

## 2020-09-09 DIAGNOSIS — E785 Hyperlipidemia, unspecified: Secondary | ICD-10-CM | POA: Diagnosis not present

## 2020-09-09 DIAGNOSIS — D649 Anemia, unspecified: Secondary | ICD-10-CM | POA: Diagnosis not present

## 2020-09-09 DIAGNOSIS — R351 Nocturia: Secondary | ICD-10-CM

## 2020-09-09 DIAGNOSIS — R3912 Poor urinary stream: Secondary | ICD-10-CM | POA: Diagnosis not present

## 2020-09-09 LAB — FOLATE: Folate: >23.6 ng/mL (ref >5.9)

## 2020-09-09 LAB — CBC WITH DIFFERENTIAL/PLATELET
Basophils Absolute: 0.1 10*3/uL (ref 0.0–0.1)
Basophils Relative: 1.6 % (ref 0.0–3.0)
Eosinophils Absolute: 0.2 10*3/uL (ref 0.0–0.7)
Eosinophils Relative: 4.9 % (ref 0.0–5.0)
HCT: 41.3 % (ref 39.0–52.0)
Hemoglobin: 14.1 g/dL (ref 13.0–17.0)
Lymphocytes Relative: 31.9 % (ref 12.0–46.0)
Lymphs Abs: 1.3 10*3/uL (ref 0.7–4.0)
MCHC: 34.1 g/dL (ref 30.0–36.0)
MCV: 101.6 fl — ABNORMAL HIGH (ref 78.0–100.0)
Monocytes Absolute: 0.5 10*3/uL (ref 0.1–1.0)
Monocytes Relative: 12.5 % — ABNORMAL HIGH (ref 3.0–12.0)
Neutro Abs: 2 10*3/uL (ref 1.4–7.7)
Neutrophils Relative %: 49.1 % (ref 43.0–77.0)
Platelets: 203 10*3/uL (ref 150.0–400.0)
RBC: 4.07 Mil/uL — ABNORMAL LOW (ref 4.22–5.81)
RDW: 13.4 % (ref 11.5–15.5)
WBC: 4.1 10*3/uL (ref 4.0–10.5)

## 2020-09-09 LAB — LIPID PANEL
Cholesterol: 136 mg/dL (ref 0–200)
HDL: 58.4 mg/dL (ref >39.00)
LDL Cholesterol: 61 mg/dL (ref 0–99)
NonHDL: 77.94
Total CHOL/HDL Ratio: 2
Triglycerides: 86 mg/dL (ref 0.0–149.0)
VLDL: 17.2 mg/dL (ref 0.0–40.0)

## 2020-09-09 LAB — COMPREHENSIVE METABOLIC PANEL
ALT: 22 U/L (ref 0–53)
AST: 23 U/L (ref 0–37)
Albumin: 3.9 g/dL (ref 3.5–5.2)
Alkaline Phosphatase: 72 U/L (ref 39–117)
BUN: 14 mg/dL (ref 6–23)
CO2: 31 mEq/L (ref 19–32)
Calcium: 9 mg/dL (ref 8.4–10.5)
Chloride: 104 mEq/L (ref 96–112)
Creatinine, Ser: 0.97 mg/dL (ref 0.40–1.50)
GFR: 76.64 mL/min (ref >60.00)
Glucose, Bld: 88 mg/dL (ref 70–99)
Potassium: 4.1 mEq/L (ref 3.5–5.1)
Sodium: 140 mEq/L (ref 135–145)
Total Bilirubin: 0.7 mg/dL (ref 0.2–1.2)
Total Protein: 6.9 g/dL (ref 6.0–8.3)

## 2020-09-09 LAB — HEMOGLOBIN A1C: Hgb A1c MFr Bld: 6 % (ref 4.6–6.5)

## 2020-09-09 LAB — VITAMIN B12: Vitamin B-12: 801 pg/mL (ref 211–911)

## 2020-09-09 LAB — PSA: PSA: 1.18 ng/mL (ref 0.10–4.00)

## 2020-09-13 ENCOUNTER — Other Ambulatory Visit: Payer: Self-pay

## 2020-09-13 ENCOUNTER — Ambulatory Visit: Payer: Medicare Other

## 2020-09-13 ENCOUNTER — Telehealth: Payer: Self-pay

## 2020-09-13 ENCOUNTER — Other Ambulatory Visit: Payer: Self-pay | Admitting: *Deleted

## 2020-09-13 DIAGNOSIS — I6529 Occlusion and stenosis of unspecified carotid artery: Secondary | ICD-10-CM

## 2020-09-13 NOTE — Telephone Encounter (Signed)
Called patient 3 times trying to complete AWV. Patient never answered. Left message on voicemail letting patient know I called, appointment was cancelled, he can reschedule appointment or provider might complete at his upcoming physical.

## 2020-09-16 ENCOUNTER — Other Ambulatory Visit: Payer: Self-pay

## 2020-09-16 ENCOUNTER — Ambulatory Visit (INDEPENDENT_AMBULATORY_CARE_PROVIDER_SITE_OTHER): Payer: Medicare Other | Admitting: Family Medicine

## 2020-09-16 ENCOUNTER — Encounter: Payer: Self-pay | Admitting: Family Medicine

## 2020-09-16 VITALS — BP 110/70 | HR 66 | Temp 96.9°F | Ht 66.25 in | Wt 186.2 lb

## 2020-09-16 DIAGNOSIS — Z7189 Other specified counseling: Secondary | ICD-10-CM

## 2020-09-16 DIAGNOSIS — Z0000 Encounter for general adult medical examination without abnormal findings: Secondary | ICD-10-CM

## 2020-09-16 DIAGNOSIS — I358 Other nonrheumatic aortic valve disorders: Secondary | ICD-10-CM

## 2020-09-16 DIAGNOSIS — I6502 Occlusion and stenosis of left vertebral artery: Secondary | ICD-10-CM

## 2020-09-16 DIAGNOSIS — E785 Hyperlipidemia, unspecified: Secondary | ICD-10-CM

## 2020-09-16 DIAGNOSIS — N401 Enlarged prostate with lower urinary tract symptoms: Secondary | ICD-10-CM

## 2020-09-16 DIAGNOSIS — I6523 Occlusion and stenosis of bilateral carotid arteries: Secondary | ICD-10-CM

## 2020-09-16 DIAGNOSIS — R7303 Prediabetes: Secondary | ICD-10-CM

## 2020-09-16 MED ORDER — ROSUVASTATIN CALCIUM 20 MG PO TABS: 20.0000 mg | ORAL_TABLET | Freq: Every day | ORAL | 3 refills | Status: DC

## 2020-09-16 NOTE — Assessment & Plan Note (Signed)
Preventative protocols reviewed and updated unless pt declined. Discussed healthy diet and lifestyle.  

## 2020-09-16 NOTE — Progress Notes (Signed)
Patient ID: Larry Kent, male    DOB: 1945-11-30, 75 y.o.   MRN: [id number]  This visit was conducted in person.  BP 110/70 (BP Location: Left Arm, Patient Position: Sitting, Cuff Size: Normal)   Pulse 66   Temp (!) 96.9 F (36.1 C) (Temporal)   Ht 5' 6.25" (1.683 m)   Wt 186 lb 3 oz (84.5 kg)   SpO2 98%   BMI 29.83 kg/m    CC: AMW Subjective:   HPI: Larry Kent is a 75 y.o. male presenting on 09/16/2020 for Medicare Wellness   Did not see health advisor this year.   Hearing Screening   125Hz  250Hz  500Hz  1000Hz  2000Hz  3000Hz  4000Hz  6000Hz  8000Hz   Right ear:   40 40 40  0    Left ear:   40 40 40  0    Vision Screening Comments: Eye exam at [facility] in Jan 2022  [place] Visit from 09/16/2020 in [place] at [facility] Total Score 0    Ongoing tinnitus, has seen ENT.  Fall Risk  09/16/2020 12/05/2019 07/14/2019 07/06/2018 07/06/2017  Falls in the past year? 0 0 0 0 No  Number falls in past yr: - 0 0 - -  Injury with Fall? - 0 0 - -  Comment - N/A- no falls reported - - -  Risk for fall due to : - No Fall Risks No Fall Risks - -  Follow up - Falls prevention discussed Falls evaluation completed;Falls prevention discussed - -   Aortic stenosis monitored by cardiology. Carotid stenosis s/p CEA (Dr Carlis Abbott [facility])  Recent viral stomach bug - recovering from this.   Preventative: COLONOSCOPY Date: ~2010 medium int hemorrhoids, o/w WNL, rpt 5 yrs given fmhx (Dr. Oletta Lamas) - discussed.cologuard normal 2018.  Colonoscopy 08/2019 - 5 TAs, diverticulosis, rpt 3 yrs ([place]) Prostate screening - sees Dr Junious Silk yearly at [place]. H/o BPH on flomax, fmhx prostate cancer. H/o kidney stones as well.  Lung cancer screening - not eligible  Flu shotyearly COVID vaccine Moderna 07/2019, 08/2019, 04/2020 Td- 2013  Pneumovax - 2013, prevnar 2015  shingrix - discussed  Advanced directives: packet provided today. Would want wife to be HCPOA.  Has at home - needs notarized and will bring [place] copy.Ok with CPR and temporary life support, would want family to to help decide if prolonged illness. Would be ok with feeding tube.  Seat belt use discussed Sunscreen use discussed. No changing moles on skin. Sees derm.  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 Interim medical history since our last visit reviewed. Allergies and medications reviewed and updated. Outpatient Medications Prior to Visit  Medication Sig Dispense Refill  . aspirin EC 81 MG tablet Take 1 tablet (81 mg total) by mouth daily. 90 tablet 3  . fluticasone (FLONASE) 50 MCG/ACT nasal spray Place 1 spray into both nostrils daily as needed.    . Multiple Vitamin (MULTIVITAMIN) tablet Take 1 tablet by mouth daily.    . tamsulosin (FLOMAX)  0.4 MG CAPS capsule Take 1 capsule (0.4 mg total) by mouth daily.    . Zinc 50 MG CAPS Take 1 capsule by mouth daily.    . rosuvastatin (CRESTOR) 20 MG tablet TAKE 1 TABLET BY MOUTH EVERY DAY 90 tablet 0   No facility-administered medications prior to visit.     Per HPI unless specifically indicated in ROS section below Review of Systems  Constitutional: Positive for appetite change. Negative for activity change, chills, fatigue, fever and unexpected weight change.  HENT: Negative for hearing loss.   Eyes: Negative for visual disturbance.  Respiratory: Negative for cough, chest tightness, shortness of breath and wheezing.   Cardiovascular: Negative for chest pain,  palpitations and leg swelling.  Gastrointestinal: Positive for diarrhea and nausea. Negative for abdominal distention, abdominal pain, blood in stool, constipation and vomiting.       Fullness feeling after recent presumed viral gastroenteritis  Genitourinary: Negative for difficulty urinating and hematuria.  Musculoskeletal: Negative for arthralgias, myalgias and neck pain.  Skin: Negative for rash.  Neurological: Negative for dizziness, seizures, syncope and headaches.  Hematological: Negative for adenopathy. Does not bruise/bleed easily.  Psychiatric/Behavioral: Negative for dysphoric mood. The patient is not nervous/anxious.   All other systems reviewed and are negative.  Objective:  BP 110/70 (BP Location: Left Arm, Patient Position: Sitting, Cuff Size: Normal)   Pulse 66   Temp (!) 96.9 F (36.1 C) (Temporal)   Ht 5' 6.25" (1.683 m)   Wt 186 lb 3 oz (84.5 kg)   SpO2 98%   BMI 29.83 kg/m   Wt Readings from Last 3 Encounters:  09/16/20 186 lb 3 oz (84.5 kg)  03/05/20 183 lb (83 kg)  12/19/19 188 lb 12.8 oz (85.6 kg)      Physical Exam Vitals and nursing note reviewed.  Constitutional:      General: He is not in acute distress.    Appearance: Normal appearance. He is well-developed. He is not diaphoretic.  HENT:     Head: Normocephalic and atraumatic.     Right Ear: Hearing, tympanic membrane, ear canal and external ear normal.     Left Ear: Hearing, tympanic membrane, ear canal and external ear normal.  Eyes:     General: No scleral icterus.    Extraocular Movements: Extraocular movements intact.     Conjunctiva/sclera: Conjunctivae normal.     Pupils: Pupils are equal, round, and reactive to light.  Neck:     Vascular: Carotid bruit (referred from murmur) present.  Cardiovascular:     Rate and Rhythm: Normal rate and regular rhythm.     Pulses: Normal pulses.          Radial pulses are 2+ on the right side and 2+ on the left side.     Heart sounds: Murmur (3/6  systolic USB) heard.    Pulmonary:     Effort: Pulmonary effort is normal. No respiratory distress.     Breath sounds: Normal breath sounds. No wheezing, rhonchi or rales.  Abdominal:     General: Abdomen is flat. Bowel sounds are normal. There is no distension.     Palpations: Abdomen is soft. There is no mass.     Tenderness: There is no abdominal tenderness. There is no guarding or rebound.     Hernia: No hernia is present.  Musculoskeletal:        General: Normal range of motion.     Cervical back: Normal range of motion and neck supple.  Right lower leg: No edema.     Left lower leg: No edema.  Lymphadenopathy:     Cervical: No cervical adenopathy.     Right cervical: No superficial cervical adenopathy.    Left cervical: No superficial cervical adenopathy.  Skin:    General: Skin is warm and dry.     Findings: No rash.  Neurological:     General: No focal deficit present.     Mental Status: He is alert and oriented to person, place, and time.     Comments:  CN grossly intact, station and gait intact Recall 3/3  Calculation 5/5 DLROW  Psychiatric:        Mood and Affect: Mood normal.        Behavior: Behavior normal.        Thought Content: Thought content normal.        Judgment: Judgment normal.       Results for orders placed or performed in visit on 09/09/20  Vitamin B12  Result Value Ref Range   Vitamin B-12 801 211 - 911 pg/mL  Folate  Result Value Ref Range   Folate >23.6 >5.9 ng/mL  CBC with Differential/Platelet  Result Value Ref Range   WBC 4.1 4.0 - 10.5 K/uL   RBC 4.07 (L) 4.22 - 5.81 Mil/uL   Hemoglobin 14.1 13.0 - 17.0 g/dL   HCT 41.3 39.0 - 52.0 %   MCV 101.6 (H) 78.0 - 100.0 fl   MCHC 34.1 30.0 - 36.0 g/dL   RDW 13.4 11.5 - 15.5 %   Platelets 203.0 150.0 - 400.0 K/uL   Neutrophils Relative % 49.1 43.0 - 77.0 %   Lymphocytes Relative 31.9 12.0 - 46.0 %   Monocytes Relative 12.5 (H) 3.0 - 12.0 %   Eosinophils Relative 4.9 0.0 - 5.0 %    Basophils Relative 1.6 0.0 - 3.0 %   Neutro Abs 2.0 1.4 - 7.7 K/uL   Lymphs Abs 1.3 0.7 - 4.0 K/uL   Monocytes Absolute 0.5 0.1 - 1.0 K/uL   Eosinophils Absolute 0.2 0.0 - 0.7 K/uL   Basophils Absolute 0.1 0.0 - 0.1 K/uL  PSA  Result Value Ref Range   PSA 1.18 0.10 - 4.00 ng/mL  Hemoglobin A1c  Result Value Ref Range   Hgb A1c MFr Bld 6.0 4.6 - 6.5 %  Comprehensive metabolic panel  Result Value Ref Range   Sodium 140 135 - 145 mEq/L   Potassium 4.1 3.5 - 5.1 mEq/L   Chloride 104 96 - 112 mEq/L   CO2 31 19 - 32 mEq/L   Glucose, Bld 88 70 - 99 mg/dL   BUN 14 6 - 23 mg/dL   Creatinine, Ser 0.97 0.40 - 1.50 mg/dL   Total Bilirubin 0.7 0.2 - 1.2 mg/dL   Alkaline Phosphatase 72 39 - 117 U/L   AST 23 0 - 37 U/L   ALT 22 0 - 53 U/L   Total Protein 6.9 6.0 - 8.3 g/dL   Albumin 3.9 3.5 - 5.2 g/dL   GFR 76.64 >60.00 mL/min   Calcium 9.0 8.4 - 10.5 mg/dL  Lipid panel  Result Value Ref Range   Cholesterol 136 0 - 200 mg/dL   Triglycerides 86.0 0.0 - 149.0 mg/dL   HDL 58.40 >39.00 mg/dL   VLDL 17.2 0.0 - 40.0 mg/dL   LDL Cholesterol 61 0 - 99 mg/dL   Total CHOL/HDL Ratio 2    NonHDL 77.94    Assessment & Plan:  This visit  occurred during the SARS-CoV-2 public health emergency.  Safety protocols were in place, including screening questions prior to the visit, additional usage of staff PPE, and extensive cleaning of exam room while observing appropriate contact time as indicated for disinfecting solutions.   Problem List Items Addressed This Visit    Benign prostatic hyperplasia    Chronic, stable on flomax daily.       Medicare annual wellness visit, subsequent - Primary    I have personally reviewed the Medicare Annual Wellness questionnaire and have noted 1. The patient's medical and social history 2. Their use of alcohol, tobacco or illicit drugs 3. Their current medications and supplements 4. The patient's functional ability including ADL's, fall risks, home safety risks and  hearing or visual impairment. Cognitive function has been assessed and addressed as indicated.  5. Diet and physical activity 6. Evidence for depression or mood disorders The patients weight, height, BMI have been recorded in the chart. I have made referrals, counseling and provided education to the patient based on review of the above and I have provided the pt with a written personalized care plan for preventive services. Provider list updated.. See scanned questionairre as needed for further documentation. Reviewed preventative protocols and updated unless pt declined.       HLD (hyperlipidemia)    Chronic, stable on crestor - continue. The 10-year ASCVD risk score Mikey Bussing DC Brooke Bonito., et al., 2013) is: 16.5%   Values used to calculate the score:     Age: 69 years     Sex: Male     Is Non-Hispanic African American: No     Diabetic: No     Tobacco smoker: No     Systolic Blood Pressure: 160 mmHg     Is BP treated: No     HDL Cholesterol: 58.4 mg/dL     Total Cholesterol: 136 mg/dL       Relevant Medications   rosuvastatin (CRESTOR) 20 MG tablet   Advanced care planning/counseling discussion    Advanced directives: packet provided today. Would want wife to be HCPOA. Has at home - needs notarized and will bring [place] copy.Ok with CPR and temporary life support, would want family to to help decide if prolonged illness. Would be ok with feeding tube.       Prediabetes    Encouraged limiting added sugar in diet.       Health maintenance examination    Preventative protocols reviewed and updated unless pt declined. Discussed healthy diet and lifestyle.       Carotid stenosis    S/p CEA, followed by [facility]      Relevant Medications   rosuvastatin (CRESTOR) 20 MG tablet   Occlusion of left vertebral artery   Relevant Medications   rosuvastatin (CRESTOR) 20 MG tablet   Aortic valve sclerosis    Ongoing murmur followed by cardiology.       Relevant Medications   rosuvastatin  (CRESTOR) 20 MG tablet       Meds ordered this encounter  Medications  . rosuvastatin (CRESTOR) 20 MG tablet    Sig: Take 1 tablet (20 mg total) by mouth daily.    Dispense:  90 tablet    Refill:  3   No orders of the defined types were placed in this encounter.   Patient instructions: If interested, check with pharmacy about new 2 shot shingles series (shingrix). Return as needed or in 1 year for next physical.  You are doing well today   Follow  up plan: Return in about 1 year (around 09/16/2021) for annual exam, prior fasting for blood work, medicare wellness visit.  Ria Bush, MD

## 2020-09-16 NOTE — Assessment & Plan Note (Signed)
S/p CEA, followed by [facility]

## 2020-09-16 NOTE — Assessment & Plan Note (Signed)

## 2020-09-16 NOTE — Assessment & Plan Note (Signed)
Advanced directives: packet provided today. Would want wife to be HCPOA. Has at home - needs notarized and will bring [place] copy.Ok with CPR and temporary life support, would want family to to help decide if prolonged illness. Would be ok with feeding tube.

## 2020-09-16 NOTE — Assessment & Plan Note (Signed)
Chronic, stable on flomax daily.

## 2020-09-16 NOTE — Assessment & Plan Note (Signed)
Ongoing murmur followed by cardiology.

## 2020-09-16 NOTE — Patient Instructions (Addendum)
If interested, check with pharmacy about new 2 shot shingles series (shingrix). Return as needed or in 1 year for next physical.  You are doing well today   Health Maintenance After Age 75 After age 16, you are at a higher risk for certain long-term diseases and infections as well as injuries from falls. Falls are a major cause of broken bones and head injuries in people who are older than age 62. Getting regular preventive care can help to keep you healthy and well. Preventive care includes getting regular testing and making lifestyle changes as recommended by your health care provider. Talk with your health care provider about:  Which screenings and tests you should have. A screening is a test that checks for a disease when you have no symptoms.  A diet and exercise plan that is right for you. What should I know about screenings and tests to prevent falls? Screening and testing are the best ways to find a health problem early. Early diagnosis and treatment give you the best chance of managing medical conditions that are common after age 5. Certain conditions and lifestyle choices may make you more likely to have a fall. Your health care provider may recommend:  Regular vision checks. Poor vision and conditions such as cataracts can make you more likely to have a fall. If you wear glasses, make sure to get your prescription updated if your vision changes.  Medicine review. Work with your health care provider to regularly review all of the medicines you are taking, including over-the-counter medicines. Ask your health care provider about any side effects that may make you more likely to have a fall. Tell your health care provider if any medicines that you take make you feel dizzy or sleepy.  Osteoporosis screening. Osteoporosis is a condition that causes the bones to get weaker. This can make the bones weak and cause them to break more easily.  Blood pressure screening. Blood pressure changes and  medicines to control blood pressure can make you feel dizzy.  Strength and balance checks. Your health care provider may recommend certain tests to check your strength and balance while standing, walking, or changing positions.  Foot health exam. Foot pain and numbness, as well as not wearing proper footwear, can make you more likely to have a fall.  Depression screening. You may be more likely to have a fall if you have a fear of falling, feel emotionally low, or feel unable to do activities that you used to do.  Alcohol use screening. Using too much alcohol can affect your balance and may make you more likely to have a fall. What actions can I take to lower my risk of falls? General instructions  Talk with your health care provider about your risks for falling. Tell your health care provider if: ? You fall. Be sure to tell your health care provider about all falls, even ones that seem minor. ? You feel dizzy, sleepy, or off-balance.  Take over-the-counter and prescription medicines only as told by your health care provider. These include any supplements.  Eat a healthy diet and maintain a healthy weight. A healthy diet includes low-fat dairy products, low-fat (lean) meats, and fiber from whole grains, beans, and lots of fruits and vegetables. Home safety  Remove any tripping hazards, such as rugs, cords, and clutter.  Install safety equipment such as grab bars in bathrooms and safety rails on stairs.  Keep rooms and walkways well-lit. Activity  Follow a regular exercise program to  stay fit. This will help you maintain your balance. Ask your health care provider what types of exercise are appropriate for you.  If you need a cane or walker, use it as recommended by your health care provider.  Wear supportive shoes that have nonskid soles.   Lifestyle  Do not drink alcohol if your health care provider tells you not to drink.  If you drink alcohol, limit how much you have: ? 0-1  drink a day for women. ? 0-2 drinks a day for men.  Be aware of how much alcohol is in your drink. In the [place] one drink equals one typical bottle of beer (12 oz), one-half glass of wine (5 oz), or one shot of hard liquor (1 oz).  Do not use any products that contain nicotine or tobacco, such as cigarettes and e-cigarettes. If you need help quitting, ask your health care provider. Summary  Having a healthy lifestyle and getting preventive care can help to protect your health and wellness after age 13.  Screening and testing are the best way to find a health problem early and help you avoid having a fall. Early diagnosis and treatment give you the best chance for managing medical conditions that are more common for people who are older than age 37.  Falls are a major cause of broken bones and head injuries in people who are older than age 13. Take precautions to prevent a fall at home.  Work with your health care provider to learn what changes you can make to improve your health and wellness and to prevent falls. This information is not intended to replace advice given to you by your health care provider. Make sure you discuss any questions you have with your health care provider. Document Revised: 10/06/2018 Document Reviewed: 04/28/2017 Elsevier Patient Education  2021 [employer].

## 2020-09-16 NOTE — Assessment & Plan Note (Signed)
Encouraged limiting added sugar in diet.

## 2020-09-16 NOTE — Assessment & Plan Note (Signed)
Chronic, stable on crestor - continue. The 10-year ASCVD risk score Larry Kent Larry Kent., et al., 2013) is: 16.5%   Values used to calculate the score:     Age: 75 years     Sex: Male     Is Non-Hispanic African American: No     Diabetic: No     Tobacco smoker: No     Systolic Blood Pressure: 929 mmHg     Is BP treated: No     HDL Cholesterol: 58.4 mg/dL     Total Cholesterol: 136 mg/dL

## 2020-09-17 ENCOUNTER — Ambulatory Visit (HOSPITAL_COMMUNITY)
Admission: RE | Admit: 2020-09-17 | Discharge: 2020-09-17 | Disposition: A | Discharge status: Home / Self Care | Payer: Medicare Other | Source: Ambulatory Visit | Attending: Vascular Surgery | Admitting: Vascular Surgery

## 2020-09-17 ENCOUNTER — Ambulatory Visit: Payer: Medicare Other | Admitting: Vascular Surgery

## 2020-09-17 ENCOUNTER — Other Ambulatory Visit: Payer: Self-pay

## 2020-09-17 ENCOUNTER — Encounter: Payer: Self-pay | Admitting: Vascular Surgery

## 2020-09-17 VITALS — BP 122/76 | HR 59 | Temp 97.6°F | Resp 16 | Ht 68.0 in | Wt 186.0 lb

## 2020-09-17 DIAGNOSIS — I6523 Occlusion and stenosis of bilateral carotid arteries: Secondary | ICD-10-CM

## 2020-09-17 DIAGNOSIS — I6529 Occlusion and stenosis of unspecified carotid artery: Secondary | ICD-10-CM | POA: Diagnosis not present

## 2020-09-17 NOTE — Progress Notes (Signed)
Patient name: Larry Kent MRN: [id number] DOB: [birth year] Sex: male  REASON FOR VISIT: 43-month follow-up for surveillance of carotid artery disease  HPI: Larry Kent is a 75 y.o. male that presents for 18-month follow-up for surveillance of carotid artery disease.  He reports no new neurologic events over the last 9 months.  He previously underwent a right carotid endarterectomy on 12/01/2019 for an asymptomatic high-grade stenosis.  He remains on aspirin statin.  He does not smoke.  He is still working about 3 days a week.  No new specific concerns today.  Past Medical History:  Diagnosis Date  . Allergy   . Arthritis    both knees   . BCC (basal cell carcinoma), arm, right 09/2019   MOHS ([facility])  . BCC (basal cell carcinoma), face 2014   L preauricular s/p MOHS  . BCC (basal cell carcinoma), face 02/2018   L upper lip  . BPH (benign prostatic hypertrophy)    Dr. Junious Silk @ [facility]  . Carotid stenosis 37/8588   RICA 50-27%, LICA 7-41%, L vertebral occlusion, rpt 1 yr  . Cataract    removed both eyes   . FHx: colon cancer   . FHx: prostate cancer   . Heart murmur    mild aortic stenosis   . Hyperlipidemia    borderline- on rosuvastatin now normal   . Nephrolithiasis 09/2012  . Occlusion of right vertebral artery 05/2015    Past Surgical History:  Procedure Laterality Date  . BASAL CELL CARCINOMA EXCISION  05/2018   lip, 2020 x3  basal cells removed   . CATARACT EXTRACTION  2008   Left  . CATARACT EXTRACTION  2013   Right (Eppes)  . COLONOSCOPY  ~2010   medium int hemorrhoids, o/w WNL, rpt 5 yrs given fmhx (Dr. Oletta Lamas)  . COLONOSCOPY  08/2019   TAs, diverticulosis, rpt 3 yrs (Armbruster)  . ENDARTERECTOMY Right 12/01/2019   Procedure: RIGHT ENDARTERECTOMY CAROTID;  Surgeon: [name], MD;  Location: [place];  Service: Vascular;  Laterality: Right;  . exercise treadmill  11/2005   WNL [place])  . HERNIA REPAIR  2008   Right  . MOHS SURGERY Left  spring 2014   basal cell face  . PATCH ANGIOPLASTY Right 12/01/2019   Procedure: PATCH ANGIOPLASTY USING [place];  Surgeon: [name], MD;  Location: [place];  Service: Vascular;  Laterality: Right;  . Testicular Biopsy  2003   benign, varicocele  . US ECHOCARDIOGRAPHY  11/2005   aortic sclerosis, EF 28-78%, diastolic dysfunction    Family History  Problem Relation Age of Onset  . Cancer Father 32       prostate  . Coronary artery disease Father 61       MI  . Prostate cancer Father   . Cancer Paternal Grandfather 15       prostate  . Prostate cancer Paternal Grandfather   . Stroke Mother   . Hypertension Mother   . Cancer Mother 40       colon  . Colon cancer Mother   . Coronary artery disease Maternal Uncle   . Cancer Sister 67       breast  . Breast cancer Sister   . Colon cancer Maternal Grandmother   . Diabetes Neg Hx   . Colon polyps Neg Hx   . Esophageal cancer Neg Hx   . Rectal cancer Neg Hx   . Stomach cancer Neg Hx  SOCIAL HISTORY: Social History   Tobacco Use  . Smoking status: Never Smoker  . Smokeless tobacco: Never Used  Substance Use Topics  . Alcohol use: Yes    Alcohol/week: 0.0 standard drinks    Comment: rare    No Known Allergies  Current Outpatient Medications  Medication Sig Dispense Refill  . aspirin EC 81 MG tablet Take 1 tablet (81 mg total) by mouth daily. 90 tablet 3  . fluticasone (FLONASE) 50 MCG/ACT nasal spray Place 1 spray into both nostrils daily as needed.    . Multiple Vitamin (MULTIVITAMIN) tablet Take 1 tablet by mouth daily.    . rosuvastatin (CRESTOR) 20 MG tablet Take 1 tablet (20 mg total) by mouth daily. 90 tablet 3  . tamsulosin (FLOMAX) 0.4 MG CAPS capsule Take 1 capsule (0.4 mg total) by mouth daily.    . Zinc 50 MG CAPS Take 1 capsule by mouth daily.     No current facility-administered medications for this visit.    REVIEW OF SYSTEMS:  [X]  denotes positive finding, [ ]  denotes  negative finding Cardiac  Comments:  Chest pain or chest pressure:    Shortness of breath upon exertion:    Short of breath when lying flat:    Irregular heart rhythm:        Vascular    Pain in calf, thigh, or hip brought on by ambulation:    Pain in feet at night that wakes you up from your sleep:     Blood clot in your veins:    Leg swelling:         Pulmonary    Oxygen at home:    Productive cough:     Wheezing:         Neurologic    Sudden weakness in arms or legs:     Sudden numbness in arms or legs:     Sudden onset of difficulty speaking or slurred speech:    Temporary loss of vision in one eye:     Problems with dizziness:         Gastrointestinal    Blood in stool:     Vomited blood:         Genitourinary    Burning when urinating:     Blood in urine:        Psychiatric    Major depression:         Hematologic    Bleeding problems:    Problems with blood clotting too easily:        Skin    Rashes or ulcers:        Constitutional    Fever or chills:      PHYSICAL EXAM: Vitals:   09/17/20 1145 09/17/20 1148  BP: 109/69 122/76  Pulse: (!) 59 (!) 59  Resp: 16   Temp: 97.6 F (36.4 C)   SpO2: 96%   Weight: 186 lb (84.4 kg)   Height: 5\' 8"  (1.727 m)     GENERAL: The patient is a well-nourished male, in no acute distress. The vital signs are documented above. CARDIAC: There is a regular rate and rhythm.  VASCULAR:  Right neck incision well-healed MUSCULOSKELETAL: There are no major deformities or cyanosis. NEUROLOGIC: No focal weakness or paresthesias are detected.  Cranial nerves II through XII grossly intact.   DATA:   Carotid duplex today shows a widely patent right carotid after previous endarterectomy and minimal 1 to 39% stenosis in the left ICA.  Assessment/Plan:  75 year old  male status post right carotid endarterectomy on 12/01/2019 for an asymptomatic high-grade stenosis presents for 38-month follow-up and surveillance of his carotid  artery disease.  Discussed with him today that his right carotid endarterectomy site is widely patent with no evidence of recurrent disease.  In addition he has minimal disease in the left ICA.  He needs to remain on aspirin statin for risk reduction as I discussed with him today.  Would continue surveillance with another carotid ultrasound in 1 year in our office.  Overall seems to be doing very well.   [name], MD [facility] Office: [phone]

## 2020-09-26 ENCOUNTER — Ambulatory Visit: Payer: Medicare Other | Admitting: Cardiovascular Disease

## 2020-09-26 ENCOUNTER — Encounter: Payer: Self-pay | Admitting: Cardiovascular Disease

## 2020-09-26 ENCOUNTER — Other Ambulatory Visit: Payer: Self-pay

## 2020-09-26 VITALS — BP 104/68 | HR 71 | Ht 69.0 in | Wt 189.0 lb

## 2020-09-26 DIAGNOSIS — R413 Other amnesia: Secondary | ICD-10-CM | POA: Diagnosis not present

## 2020-09-26 DIAGNOSIS — I779 Disorder of arteries and arterioles, unspecified: Secondary | ICD-10-CM | POA: Diagnosis not present

## 2020-09-26 DIAGNOSIS — E785 Hyperlipidemia, unspecified: Secondary | ICD-10-CM

## 2020-09-26 DIAGNOSIS — I358 Other nonrheumatic aortic valve disorders: Secondary | ICD-10-CM

## 2020-09-26 NOTE — Patient Instructions (Signed)
Medication Instructions:  Your physician recommends that you continue on your current medications as directed. Please refer to the Current Medication list given to you today.  *If you need a refill on your cardiac medications before your next appointment, please call your pharmacy*   Lab Work: None ordered If you have labs (blood work) drawn today and your tests are completely normal, you will receive your results only by: Marland Kitchen MyChart Message (if you have MyChart) OR . A paper copy in the mail If you have any lab test that is abnormal or we need to change your treatment, we will call you to review the results.   Testing/Procedures: Your physician has requested that you have an echocardiogram. Echocardiography is a painless test that uses sound waves to create images of your heart. It provides your doctor with information about the size and shape of your heart and how well your heart's chambers and valves are working. This procedure takes approximately one hour. There are no restrictions for this procedure.     Follow-Up: At [facility], you and your health needs are our priority.  As part of our continuing mission to provide you with exceptional heart care, we have created designated Provider Care Teams.  These Care Teams include your primary Cardiologist (physician) and Advanced Practice Providers (APPs -  Physician Assistants and Nurse Practitioners) who all work together to provide you with the care you need, when you need it.  We recommend signing up for the patient portal called "MyChart".  Sign up information is provided on this After Visit Summary.  MyChart is used to connect with patients for Virtual Visits (Telemedicine).  Patients are able to view lab/test results, encounter notes, upcoming appointments, etc.  Non-urgent messages can be sent to your provider as well.   To learn more about what you can do with MyChart, go to [email or web address].    Your next appointment:    Your physician wants you to follow-up in: 1 year You will receive a reminder letter in the mail two months in advance. If you don't receive a letter, please call our office to schedule the follow-up appointment.   The format for your next appointment:   In Person  Provider:   You may see Kathlyn Sacramento, MD or one of the following Advanced Practice Providers on your designated Care Team:    Murray Hodgkins, NP  Christell Faith, PA-C  Marrianne Mood, PA-C  Cadence Wallace, [place]  Laurann Montana, NP    Other Instructions N/A

## 2020-09-26 NOTE — Progress Notes (Signed)
Cardiology Office Note   Date:  09/26/2020   ID:  ELOISE MULA, DOB 06-03-1946, MRN [id number]  PCP:  Ria Bush, MD  Cardiologist:   Kathlyn Sacramento, MD   Chief Complaint  Patient presents with  . Follow-up    6 month F/U post carotid testing      History of Present Illness: Larry Kent is a 75 y.o. male who is here today for a follow-up visit regarding carotid disease and  mild aortic stenosis.    The patient has known history of hyperlipidemia but no previous cardiac history.  He is not a smoker and does not have diabetes.  There is no family history of premature coronary artery disease although his father did have myocardial infarction at the age of 71. The patient is status post right carotid endarterectomy in June 2021 for severe asymptomatic stenosis. Most recent echocardiogram in February 2020 showed normal LV systolic function with mild aortic stenosis with mean gradient of 8 mmHg.  He has been doing well with no recent chest pain, shortness of breath or palpitations.  He did have issues with early memory loss but work-up by neurology was negative.  Past Medical History:  Diagnosis Date  . Allergy   . Arthritis    both knees   . BCC (basal cell carcinoma), arm, right 09/2019   MOHS ([facility])  . BCC (basal cell carcinoma), face 2014   L preauricular s/p MOHS  . BCC (basal cell carcinoma), face 02/2018   L upper lip  . BPH (benign prostatic hypertrophy)    Dr. Junious Silk @ [facility]  . Carotid stenosis 83/3383   RICA 29-19%, LICA 1-66%, L vertebral occlusion, rpt 1 yr  . Cataract    removed both eyes   . FHx: colon cancer   . FHx: prostate cancer   . Heart murmur    mild aortic stenosis   . Hyperlipidemia    borderline- on rosuvastatin now normal   . Nephrolithiasis 09/2012  . Occlusion of right vertebral artery 05/2015    Past Surgical History:  Procedure Laterality Date  . BASAL CELL CARCINOMA EXCISION  05/2018   lip, 2020 x3  basal cells  removed   . CATARACT EXTRACTION  2008   Left  . CATARACT EXTRACTION  2013   Right (Eppes)  . COLONOSCOPY  ~2010   medium int hemorrhoids, o/w WNL, rpt 5 yrs given fmhx (Dr. Oletta Lamas)  . COLONOSCOPY  08/2019   TAs, diverticulosis, rpt 3 yrs (Armbruster)  . ENDARTERECTOMY Right 12/01/2019   Procedure: RIGHT ENDARTERECTOMY CAROTID;  Surgeon: [name], MD;  Location: [place];  Service: Vascular;  Laterality: Right;  . exercise treadmill  11/2005   WNL [place] [facility])  . HERNIA REPAIR  2008   Right  . MOHS SURGERY Left spring 2014   basal cell face  . PATCH ANGIOPLASTY Right 12/01/2019   Procedure: PATCH ANGIOPLASTY USING [employer];  Surgeon: [name], MD;  Location: [place];  Service: Vascular;  Laterality: Right;  . Testicular Biopsy  2003   benign, varicocele  . US ECHOCARDIOGRAPHY  11/2005   aortic sclerosis, EF 06-00%, diastolic dysfunction     Current Outpatient Medications  Medication Sig Dispense Refill  . aspirin EC 81 MG tablet Take 1 tablet (81 mg total) by mouth daily. 90 tablet 3  . fluticasone (FLONASE) 50 MCG/ACT nasal spray Place 1 spray into both nostrils daily as needed.    . Multiple Vitamin (MULTIVITAMIN) tablet Take  1 tablet by mouth daily.    . rosuvastatin (CRESTOR) 20 MG tablet Take 1 tablet (20 mg total) by mouth daily. 90 tablet 3  . tamsulosin (FLOMAX) 0.4 MG CAPS capsule Take 1 capsule (0.4 mg total) by mouth daily.    . Zinc 50 MG CAPS Take 1 capsule by mouth daily.     No current facility-administered medications for this visit.    Allergies:   Patient has no known allergies.    Social History:  The patient  reports that he has never smoked. He has never used smokeless tobacco. He reports current alcohol use. He reports that he does not use drugs.   Family History:  The patient's family history includes Breast cancer in his sister; Cancer (age of onset: 24) in his sister; Cancer (age of onset: 71) in his paternal grandfather;  Cancer (age of onset: 46) in his father; Cancer (age of onset: 17) in his mother; Colon cancer in his maternal grandmother and mother; Coronary artery disease in his maternal uncle; Coronary artery disease (age of onset: 51) in his father; Hypertension in his mother; Prostate cancer in his father and paternal grandfather; Stroke in his mother.    ROS:  Please see the history of present illness.   Otherwise, review of systems are positive for none.   All other systems are reviewed and negative.    PHYSICAL EXAM: VS:  BP 104/68 (BP Location: Left Arm, Patient Position: Sitting, Cuff Size: Normal)   Pulse 71   Ht 5\' 9"  (1.753 m)   Wt 189 lb (85.7 kg)   SpO2 96%   BMI 27.91 kg/m  , BMI Body mass index is 27.91 kg/m. GEN: Well nourished, well developed, in no acute distress  HEENT: normal  Neck: no JVD or masses.  Left carotid bruit.   Cardiac: RRR; no  rubs, or gallops,no edema .  2 / 6 systolic murmur in the aortic area which is early to mid peaking. Respiratory:  clear to auscultation bilaterally, normal work of breathing GI: soft, nontender, nondistended, + BS MS: no deformity or atrophy  Skin: warm and dry, no rash Neuro:  Strength and sensation are intact Psych: euthymic mood, full affect   EKG:  EKG is  ordered today. EKG showed normal sinus rhythm with small inferior Q waves likely not pathologic.  Recent Labs: 09/09/2020: ALT 22; BUN 14; Creatinine, Ser 0.97; Hemoglobin 14.1; Platelets 203.0; Potassium 4.1; Sodium 140    Lipid Panel    Component Value Date/Time   CHOL 136 09/09/2020 0820   CHOL 128 09/12/2018 1118   TRIG 86.0 09/09/2020 0820   HDL 58.40 09/09/2020 0820   HDL 57 09/12/2018 1118   CHOLHDL 2 09/09/2020 0820   VLDL 17.2 09/09/2020 0820   LDLCALC 61 09/09/2020 0820   LDLCALC 57 09/12/2018 1118   LDLDIRECT 144.8 02/09/2012 0928      Wt Readings from Last 3 Encounters:  09/26/20 189 lb (85.7 kg)  09/17/20 186 lb (84.4 kg)  09/16/20 186 lb 3 oz (84.5  kg)       PAD Screen 08/02/2018  Previous PAD dx? No  Previous surgical procedure? No  Pain with walking? No  Feet/toe relief with dangling? No  Painful, non-healing ulcers? No  Extremities discolored? No      ASSESSMENT AND PLAN:  1.  Carotid artery disease: Status post right carotid endarterectomy in 2021.  Recent carotid Doppler showed no evidence of stenosis on the right side and mild disease on the  left side.  2.  Hyperlipidemia: Continue treatment with rosuvastatin.  I reviewed his recent lipid profile which showed an LDL of 66.  3.  Aortic stenosis: This was mild on echocardiogram in 2020.  Recommend repeat echocardiogram which was ordered today.   4.  Early memory loss: Negative work-up by neurology.   Disposition:   FU with me in 12 months  Signed,  Kathlyn Sacramento, MD  09/26/2020 9:50 AM    [place]

## 2020-10-22 ENCOUNTER — Ambulatory Visit (INDEPENDENT_AMBULATORY_CARE_PROVIDER_SITE_OTHER): Payer: Medicare Other

## 2020-10-22 ENCOUNTER — Other Ambulatory Visit: Payer: Self-pay

## 2020-10-22 DIAGNOSIS — I358 Other nonrheumatic aortic valve disorders: Secondary | ICD-10-CM | POA: Diagnosis not present

## 2020-10-22 LAB — ECHOCARDIOGRAM COMPLETE
AR max vel: 2.2 cm2
AV Area VTI: 2.55 cm2
AV Area mean vel: 1.9 cm2
AV Mean grad: 12 mmHg
AV Peak grad: 21.5 mmHg
Ao pk vel: 2.32 m/s
Area-P 1/2: 2.56 cm2
Calc EF: 54.8 %
S' Lateral: 1.7 cm
Single Plane A2C EF: 57.6 %
Single Plane A4C EF: 53.7 %

## 2020-12-18 DIAGNOSIS — M1711 Unilateral primary osteoarthritis, right knee: Secondary | ICD-10-CM | POA: Diagnosis not present

## 2020-12-25 DIAGNOSIS — M1711 Unilateral primary osteoarthritis, right knee: Secondary | ICD-10-CM | POA: Diagnosis not present

## 2021-01-01 DIAGNOSIS — M1711 Unilateral primary osteoarthritis, right knee: Secondary | ICD-10-CM | POA: Diagnosis not present

## 2021-08-22 ENCOUNTER — Other Ambulatory Visit: Payer: Self-pay | Admitting: Family Medicine

## 2021-09-08 ENCOUNTER — Other Ambulatory Visit: Payer: Self-pay | Admitting: *Deleted

## 2021-09-08 DIAGNOSIS — I6523 Occlusion and stenosis of bilateral carotid arteries: Secondary | ICD-10-CM

## 2021-09-09 ENCOUNTER — Other Ambulatory Visit: Payer: Self-pay

## 2021-09-09 ENCOUNTER — Encounter: Payer: Self-pay | Admitting: Cardiovascular Disease

## 2021-09-09 ENCOUNTER — Ambulatory Visit (INDEPENDENT_AMBULATORY_CARE_PROVIDER_SITE_OTHER): Payer: Medicare Other | Admitting: Cardiovascular Disease

## 2021-09-09 VITALS — BP 122/64 | HR 62 | Ht 68.0 in | Wt 189.8 lb

## 2021-09-09 DIAGNOSIS — I358 Other nonrheumatic aortic valve disorders: Secondary | ICD-10-CM

## 2021-09-09 DIAGNOSIS — I779 Disorder of arteries and arterioles, unspecified: Secondary | ICD-10-CM | POA: Diagnosis not present

## 2021-09-09 DIAGNOSIS — E785 Hyperlipidemia, unspecified: Secondary | ICD-10-CM

## 2021-09-09 NOTE — Patient Instructions (Signed)
Medication Instructions:  ?No changes ?*If you need a refill on your cardiac medications before your next appointment, please call your pharmacy* ? ? ?Lab Work: ?None ordered ?If you have labs (blood work) drawn today and your tests are completely normal, you will receive your results only by: ?MyChart Message (if you have MyChart) OR ?A paper copy in the mail ?If you have any lab test that is abnormal or we need to change your treatment, we will call you to review the results. ? ? ?Testing/Procedures: ?Your physician has requested that you have an echocardiogram in 12 months. Echocardiography is a painless test that uses sound waves to create images of your heart. It provides your doctor with information about the size and shape of your heart and how well your heart?s chambers and valves are working. You may receive an ultrasound enhancing agent through an IV if needed to better visualize your heart during the echo.This procedure takes approximately one hour. There are no restrictions for this procedure. This will take place at the [address].  ? ?Follow-Up: ?At [facility], you and your health needs are our priority.  As part of our continuing mission to provide you with exceptional heart care, we have created designated Provider Care Teams.  These Care Teams include your primary Cardiologist (physician) and Advanced Practice Providers (APPs -  Physician Assistants and Nurse Practitioners) who all work together to provide you with the care you need, when you need it. ? ?We recommend signing up for the patient portal called "MyChart".  Sign up information is provided on this After Visit Summary.  MyChart is used to connect with patients for Virtual Visits (Telemedicine).  Patients are able to view lab/test results, encounter notes, upcoming appointments, etc.  Non-urgent messages can be sent to your provider as well.   ?To learn more about what you can do with MyChart, go to  [email or web address].   ? ?Your next appointment:   ?12 month(s) ? ?The format for your next appointment:   ?In Person ? ?Provider:   ?Dr. Fletcher Anon ? ? ?

## 2021-09-09 NOTE — Progress Notes (Signed)
?  ?Cardiology Office Note ? ? ?Date:  09/09/2021  ? ?ID:  Larry Kent, DOB Jan 29, 1946, MRN [id number] ? ?PCP:  Larry Bush, MD  ?Cardiologist:   Larry Sacramento, MD  ? ?No chief complaint on file. ? ? ?  ?History of Present Illness: ?Larry Kent is a 76 y.o. male who is here today for a follow-up visit regarding carotid disease and  mild aortic stenosis.   ? The patient has known history of hyperlipidemia but no previous cardiac history.  He is not a smoker and does not have diabetes.  There is no family history of premature coronary artery disease although his father did have myocardial infarction at the age of 76. ?The patient is status post right carotid endarterectomy in June 2021 for severe asymptomatic stenosis. ?Most recent echocardiogram in April 2022 showed normal LV systolic function with mild aortic stenosis with mean gradient of 12 mmHg. ? ?He has been doing very well with no chest pain, shortness of breath or palpitations. ? ?Past Medical History:  ?Diagnosis Date  ? Allergy   ? Arthritis   ? both knees   ? BCC (basal cell carcinoma), arm, right 09/2019  ? MOHS ([facility])  ? BCC (basal cell carcinoma), face 2014  ? L preauricular s/p MOHS  ? BCC (basal cell carcinoma), face 02/2018  ? L upper lip  ? BPH (benign prostatic hypertrophy)   ? Dr. Junious Silk @ [facility]  ? Carotid stenosis 05/2015  ? RICA 48-54%, LICA 6-27%, L vertebral occlusion, rpt 1 yr  ? Cataract   ? removed both eyes   ? FHx: colon cancer   ? FHx: prostate cancer   ? Heart murmur   ? mild aortic stenosis   ? Hyperlipidemia   ? borderline- on rosuvastatin now normal   ? Nephrolithiasis 09/2012  ? Occlusion of right vertebral artery 05/2015  ? ? ?Past Surgical History:  ?Procedure Laterality Date  ? BASAL CELL CARCINOMA EXCISION  05/2018  ? lip, 2020 x3  basal cells removed   ? CATARACT EXTRACTION  2008  ? Left  ? CATARACT EXTRACTION  2013  ? Right (Eppes)  ? COLONOSCOPY  ~2010  ? medium int hemorrhoids, o/w WNL, rpt 5 yrs given  fmhx (Dr. Oletta Lamas)  ? COLONOSCOPY  08/2019  ? TAs, diverticulosis, rpt 3 yrs ([facility]bruster)  ? ENDARTERECTOMY Right 12/01/2019  ? Procedure: RIGHT ENDARTERECTOMY CAROTID;  Surgeon: [name], MD;  Location: [place];  Service: Vascular;  Laterality: Right;  ? exercise treadmill  11/2005  ? WNL Irish Lack)  ? HERNIA REPAIR  2008  ? Right  ? MOHS SURGERY Left spring 2014  ? basal cell face  ? PATCH ANGIOPLASTY Right 12/01/2019  ? Procedure: PATCH ANGIOPLASTY USING Rueben Bash BIOLOGIC PATCH;  Surgeon: [name], MD;  Location: [place];  Service: Vascular;  Laterality: Right;  ? Testicular Biopsy  2003  ? benign, varicocele  ? US ECHOCARDIOGRAPHY  11/2005  ? aortic sclerosis, EF 03-50%, diastolic dysfunction  ? ? ? ?Current Outpatient Medications  ?Medication Sig Dispense Refill  ? aspirin EC 81 MG tablet Take 1 tablet (81 mg total) by mouth daily. 90 tablet 3  ? fluticasone (FLONASE) 50 MCG/ACT nasal spray Place 1 spray into both nostrils daily as needed.    ? Multiple Vitamin (MULTIVITAMIN) tablet Take 1 tablet by mouth daily.    ? rosuvastatin (CRESTOR) 20 MG tablet TAKE 1 TABLET BY MOUTH EVERY DAY 90 tablet 0  ? tamsulosin (FLOMAX) 0.4  MG CAPS capsule Take 1 capsule (0.4 mg total) by mouth daily.    ? Zinc 50 MG CAPS Take 1 capsule by mouth daily.    ? ?No current facility-administered medications for this visit.  ? ? ?Allergies:   Patient has no known allergies.  ? ? ?Social History:  The patient  reports that he has never smoked. He has never used smokeless tobacco. He reports current alcohol use. He reports that he does not use drugs.  ? ?Family History:  The patient's family history includes Breast cancer in his sister; Cancer (age of onset: 51) in his sister; Cancer (age of onset: 43) in his paternal grandfather; Cancer (age of onset: 70) in his father; Cancer (age of onset: 66) in his mother; Colon cancer in his maternal grandmother and mother; Coronary artery disease in his maternal uncle; Coronary  artery disease (age of onset: 44) in his father; Hypertension in his mother; Prostate cancer in his father and paternal grandfather; Stroke in his mother.  ? ? ?ROS:  Please see the history of present illness.   Otherwise, review of systems are positive for none.   All other systems are reviewed and negative.  ? ? ?PHYSICAL EXAM: ?VS:  BP 122/64   Pulse 62   Ht 5\' 8"  (1.727 m)   Wt 189 lb 12.8 oz (86.1 kg)   SpO2 98%   BMI 28.86 kg/m?  , BMI Body mass index is 28.86 kg/m?. ?GEN: Well nourished, well developed, in no acute distress  ?HEENT: normal  ?Neck: no JVD or masses.  Left carotid bruit.   ?Cardiac: RRR; no  rubs, or gallops,no edema .  2 / 6 systolic murmur in the aortic area which is early to mid peaking. ?Respiratory:  clear to auscultation bilaterally, normal work of breathing ?GI: soft, nontender, nondistended, + BS ?MS: no deformity or atrophy  ?Skin: warm and dry, no rash ?Neuro:  Strength and sensation are intact ?Psych: euthymic mood, full affect ? ? ?EKG:  EKG is  ordered today. ?EKG showed normal sinus rhythm with no significant ST or T wave changes. ? ?Recent Labs: ?No results found for requested labs within last 8760 hours.  ? ? ?Lipid Panel ?   ?Component Value Date/Time  ? CHOL 136 09/09/2020 0820  ? CHOL 128 09/12/2018 1118  ? TRIG 86.0 09/09/2020 0820  ? HDL 58.40 09/09/2020 0820  ? HDL 57 09/12/2018 1118  ? CHOLHDL 2 09/09/2020 0820  ? VLDL 17.2 09/09/2020 0820  ? [place] 61 09/09/2020 0820  ? [place] 57 09/12/2018 1118  ? LDLDIRECT 144.8 02/09/2012 0928  ? ?  ? ?Wt Readings from Last 3 Encounters:  ?09/09/21 189 lb 12.8 oz (86.1 kg)  ?09/26/20 189 lb (85.7 kg)  ?09/17/20 186 lb (84.4 kg)  ?  ? ? ? ?PAD Screen 08/02/2018  ?Previous PAD dx? No  ?Previous surgical procedure? No  ?Pain with walking? No  ?Feet/toe relief with dangling? No  ?Painful, non-healing ulcers? No  ?Extremities discolored? No  ? ? ? ? ?ASSESSMENT AND PLAN: ? ?1.  Carotid artery disease: Status post right carotid  endarterectomy in 2021.  He is followed by [facility]. ? ?2.  Hyperlipidemia: Continue treatment with rosuvastatin.  I reviewed his recent lipid profile which showed an LDL of 66.  He is going for labs done in the next few weeks for his physical. ? ?3.  Aortic stenosis: this was mild on most recent echocardiogram last year.  I requested a follow-up echocardiogram to  be done next year. ? ?4.  Early memory loss: Negative work-up by neurology. ? ? ?Disposition:   FU with me in 12 months ? ?Signed, ? ?Larry Sacramento, MD  ?09/09/2021 11:01 AM    ?[place] ?

## 2021-09-21 ENCOUNTER — Other Ambulatory Visit: Payer: Self-pay | Admitting: Family Medicine

## 2021-09-21 DIAGNOSIS — N401 Enlarged prostate with lower urinary tract symptoms: Secondary | ICD-10-CM

## 2021-09-21 DIAGNOSIS — R7303 Prediabetes: Secondary | ICD-10-CM

## 2021-09-21 DIAGNOSIS — E785 Hyperlipidemia, unspecified: Secondary | ICD-10-CM

## 2021-09-23 ENCOUNTER — Ambulatory Visit: Payer: Medicare Other | Admitting: Vascular Surgery

## 2021-09-23 ENCOUNTER — Other Ambulatory Visit: Payer: Self-pay

## 2021-09-23 ENCOUNTER — Ambulatory Visit (HOSPITAL_COMMUNITY)
Admission: RE | Admit: 2021-09-23 | Discharge: 2021-09-23 | Disposition: A | Discharge status: Home / Self Care | Payer: Medicare Other | Source: Ambulatory Visit | Attending: Vascular Surgery | Admitting: Vascular Surgery

## 2021-09-23 ENCOUNTER — Other Ambulatory Visit (INDEPENDENT_AMBULATORY_CARE_PROVIDER_SITE_OTHER): Payer: Medicare Other

## 2021-09-23 ENCOUNTER — Encounter: Payer: Self-pay | Admitting: Vascular Surgery

## 2021-09-23 VITALS — BP 127/71 | HR 68 | Temp 98.1°F | Ht 60.0 in | Wt 185.0 lb

## 2021-09-23 DIAGNOSIS — I6523 Occlusion and stenosis of bilateral carotid arteries: Secondary | ICD-10-CM | POA: Insufficient documentation

## 2021-09-23 DIAGNOSIS — E785 Hyperlipidemia, unspecified: Secondary | ICD-10-CM

## 2021-09-23 DIAGNOSIS — R351 Nocturia: Secondary | ICD-10-CM | POA: Diagnosis not present

## 2021-09-23 DIAGNOSIS — N401 Enlarged prostate with lower urinary tract symptoms: Secondary | ICD-10-CM | POA: Diagnosis not present

## 2021-09-23 DIAGNOSIS — R7303 Prediabetes: Secondary | ICD-10-CM | POA: Diagnosis not present

## 2021-09-23 LAB — COMPREHENSIVE METABOLIC PANEL
ALT: 16 U/L (ref 0–53)
AST: 21 U/L (ref 0–37)
Albumin: 4.1 g/dL (ref 3.5–5.2)
Alkaline Phosphatase: 62 U/L (ref 39–117)
BUN: 15 mg/dL (ref 6–23)
CO2: 29 mEq/L (ref 19–32)
Calcium: 8.9 mg/dL (ref 8.4–10.5)
Chloride: 103 mEq/L (ref 96–112)
Creatinine, Ser: 1.03 mg/dL (ref 0.40–1.50)
GFR: 70.79 mL/min (ref >60.00)
Glucose, Bld: 101 mg/dL — ABNORMAL HIGH (ref 70–99)
Potassium: 4.1 mEq/L (ref 3.5–5.1)
Sodium: 138 mEq/L (ref 135–145)
Total Bilirubin: 0.9 mg/dL (ref 0.2–1.2)
Total Protein: 6.9 g/dL (ref 6.0–8.3)

## 2021-09-23 LAB — LIPID PANEL
Cholesterol: 133 mg/dL (ref 0–200)
HDL: 55.3 mg/dL (ref >39.00)
LDL Cholesterol: 61 mg/dL (ref 0–99)
NonHDL: 77.48
Total CHOL/HDL Ratio: 2
Triglycerides: 83 mg/dL (ref 0.0–149.0)
VLDL: 16.6 mg/dL (ref 0.0–40.0)

## 2021-09-23 LAB — HEMOGLOBIN A1C: Hgb A1c MFr Bld: 6.1 % (ref 4.6–6.5)

## 2021-09-23 LAB — PSA: PSA: 1.59 ng/mL (ref 0.10–4.00)

## 2021-09-23 NOTE — Progress Notes (Signed)
? ?Patient name: Larry Kent MRN: [id number] DOB: Feb 09, 1946 Sex: male ? ?REASON FOR VISIT:1 year follow-up for surveillance of carotid artery disease ? ?HPI: ?Larry Kent is a 76 y.o. male that presents for 1 year  follow-up for surveillance of carotid artery disease.  He reports no new neurologic events or other issues over the past year.  He previously underwent a right carotid endarterectomy on 12/01/2019 for an asymptomatic high-grade stenosis.  He remains on aspirin statin.  He does not smoke.   ? ?Past Medical History:  ?Diagnosis Date  ? Allergy   ? Arthritis   ? both knees   ? BCC (basal cell carcinoma), arm, right 09/2019  ? MOHS ([facility])  ? BCC (basal cell carcinoma), face 2014  ? L preauricular s/p MOHS  ? BCC (basal cell carcinoma), face 02/2018  ? L upper lip  ? BPH (benign prostatic hypertrophy)   ? Dr. Junious Silk @ [facility]  ? Carotid stenosis 05/2015  ? RICA 25-05%, LICA 3-97%, L vertebral occlusion, rpt 1 yr  ? Cataract   ? removed both eyes   ? FHx: colon cancer   ? FHx: prostate cancer   ? Heart murmur   ? mild aortic stenosis   ? Hyperlipidemia   ? borderline- on rosuvastatin now normal   ? Nephrolithiasis 09/2012  ? Occlusion of right vertebral artery 05/2015  ? ? ?Past Surgical History:  ?Procedure Laterality Date  ? BASAL CELL CARCINOMA EXCISION  05/2018  ? lip, 2020 x3  basal cells removed   ? CATARACT EXTRACTION  2008  ? Left  ? CATARACT EXTRACTION  2013  ? Right ([facility]ppes)  ? COLONOSCOPY  ~2010  ? medium int hemorrhoids, o/w WNL, rpt 5 yrs given fmhx (Dr. Oletta Lamas)  ? COLONOSCOPY  08/2019  ? TAs, diverticulosis, rpt 3 yrs ([facility])  ? ENDARTERECTOMY Right 12/01/2019  ? Procedure: RIGHT ENDARTERECTOMY CAROTID;  Surgeon: [name], MD;  Location: [place];  Service: Vascular;  Laterality: Right;  ? exercise treadmill  11/2005  ? WNL Irish Lack)  ? HERNIA REPAIR  2008  ? Right  ? MOHS SURGERY Left spring 2014  ? basal cell face  ? PATCH ANGIOPLASTY Right 12/01/2019  ? Procedure: PATCH  ANGIOPLASTY USING Rueben Bash BIOLOGIC PATCH;  Surgeon: [name], MD;  Location: [place];  Service: Vascular;  Laterality: Right;  ? Testicular Biopsy  2003  ? benign, varicocele  ? US ECHOCARDIOGRAPHY  11/2005  ? aortic sclerosis, EF 67-34%, diastolic dysfunction  ? ? ?Family History  ?Problem Relation Age of Onset  ? Cancer Father 2  ?     prostate  ? Coronary artery disease Father 36  ?     MI  ? Prostate cancer Father   ? Cancer Paternal Grandfather 73  ?     prostate  ? Prostate cancer Paternal Grandfather   ? Stroke Mother   ? Hypertension Mother   ? Cancer Mother 57  ?     colon  ? Colon cancer Mother   ? Coronary artery disease Maternal Uncle   ? Cancer Sister 53  ?     breast  ? Breast cancer Sister   ? Colon cancer Maternal Grandmother   ? Diabetes Neg Hx   ? Colon polyps Neg Hx   ? Esophageal cancer Neg Hx   ? Rectal cancer Neg Hx   ? Stomach cancer Neg Hx   ? ? ?SOCIAL HISTORY: ?Social History  ? ?Tobacco Use  ?  Smoking status: Never  ? Smokeless tobacco: Never  ?Substance Use Topics  ? Alcohol use: Yes  ?  Alcohol/week: 0.0 standard drinks  ?  Comment: rare  ? ? ?No Known Allergies ? ?Current Outpatient Medications  ?Medication Sig Dispense Refill  ? aspirin EC 81 MG tablet Take 1 tablet (81 mg total) by mouth daily. 90 tablet 3  ? fluticasone (FLONASE) 50 MCG/ACT nasal spray Place 1 spray into both nostrils daily as needed.    ? Multiple Vitamin (MULTIVITAMIN) tablet Take 1 tablet by mouth daily.    ? rosuvastatin (CRESTOR) 20 MG tablet TAKE 1 TABLET BY MOUTH EVERY DAY 90 tablet 0  ? tamsulosin (FLOMAX) 0.4 MG CAPS capsule Take 1 capsule (0.4 mg total) by mouth daily.    ? Zinc 50 MG CAPS Take 1 capsule by mouth daily.    ? ?No current facility-administered medications for this visit.  ? ? ?REVIEW OF SYSTEMS:  ?[X]  denotes positive finding, [ ]  denotes negative finding ?Cardiac  Comments:  ?Chest pain or chest pressure:    ?Shortness of breath upon exertion:    ?Short of breath when lying flat:     ?Irregular heart rhythm:    ?    ?Vascular    ?Pain in calf, thigh, or hip brought on by ambulation:    ?Pain in feet at night that wakes you up from your sleep:     ?Blood clot in your veins:    ?Leg swelling:     ?    ?Pulmonary    ?Oxygen at home:    ?Productive cough:     ?Wheezing:     ?    ?Neurologic    ?Sudden weakness in arms or legs:     ?Sudden numbness in arms or legs:     ?Sudden onset of difficulty speaking or slurred speech:    ?Temporary loss of vision in one eye:     ?Problems with dizziness:     ?    ?Gastrointestinal    ?Blood in stool:     ?Vomited blood:     ?    ?Genitourinary    ?Burning when urinating:     ?Blood in urine:    ?    ?Psychiatric    ?Major depression:     ?    ?Hematologic    ?Bleeding problems:    ?Problems with blood clotting too easily:    ?    ?Skin    ?Rashes or ulcers:    ?    ?Constitutional    ?Fever or chills:    ? ? ?PHYSICAL EXAM: ?Vitals:  ? 09/23/21 1513  ?BP: 127/71  ?Pulse: 68  ?Temp: 98.1 ?F (36.7 ?C)  ?TempSrc: Temporal  ?SpO2: 95%  ?Weight: 185 lb (83.9 kg)  ?Height: 5' (1.524 m)  ? ? ?GENERAL: The patient is a well-nourished male, in no acute distress. The vital signs are documented above. ?CARDIAC: There is a regular rate and rhythm.  ?VASCULAR:  ?Right neck incision well-healed ?MUSCULOSKELETAL: There are no major deformities or cyanosis. ?NEUROLOGIC: No focal weakness or paresthesias are detected.  Cranial nerves II through XII grossly intact. ? ? ?DATA:  ? ?Carotid duplex today shows patent right carotid after previous endarterectomy and minimal 1 to 39% stenosis in the left ICA. ? ?Assessment/Plan: ? ?76 year old male status post right carotid endarterectomy on 12/01/2019 for an asymptomatic high-grade stenosis presents for 1 year follow-up for surveillance of his carotid artery disease.  Discussed with  him today that his right carotid endarterectomy site remains patent with no evidence of recurrent stenosis.  In addition he has minimal disease in the  left ICA 1-39% with no progression over the past year.  He needs to remain on aspirin statin for risk reduction.  Would continue surveillance with another carotid ultrasound in 1 year in our office.  Discussed we reserve surgical intervention for greater than 80% stenosis in the setting of asymptomatic disease. ? ? ?[name], MD ?[facility] ?Office: [phone] ? ? ? ? ?

## 2021-09-25 DIAGNOSIS — H04123 Dry eye syndrome of bilateral lacrimal glands: Secondary | ICD-10-CM | POA: Diagnosis not present

## 2021-09-30 ENCOUNTER — Ambulatory Visit (INDEPENDENT_AMBULATORY_CARE_PROVIDER_SITE_OTHER): Payer: Medicare Other | Admitting: Family Medicine

## 2021-09-30 ENCOUNTER — Encounter: Payer: Self-pay | Admitting: Family Medicine

## 2021-09-30 VITALS — BP 134/74 | HR 63 | Temp 97.5°F | Ht 65.75 in | Wt 185.4 lb

## 2021-09-30 DIAGNOSIS — Z0000 Encounter for general adult medical examination without abnormal findings: Secondary | ICD-10-CM | POA: Diagnosis not present

## 2021-09-30 DIAGNOSIS — I350 Nonrheumatic aortic (valve) stenosis: Secondary | ICD-10-CM

## 2021-09-30 DIAGNOSIS — E66811 Obesity, class 1: Secondary | ICD-10-CM

## 2021-09-30 DIAGNOSIS — I6523 Occlusion and stenosis of bilateral carotid arteries: Secondary | ICD-10-CM

## 2021-09-30 DIAGNOSIS — I679 Cerebrovascular disease, unspecified: Secondary | ICD-10-CM

## 2021-09-30 DIAGNOSIS — E785 Hyperlipidemia, unspecified: Secondary | ICD-10-CM

## 2021-09-30 DIAGNOSIS — I6502 Occlusion and stenosis of left vertebral artery: Secondary | ICD-10-CM

## 2021-09-30 DIAGNOSIS — N401 Enlarged prostate with lower urinary tract symptoms: Secondary | ICD-10-CM

## 2021-09-30 DIAGNOSIS — E669 Obesity, unspecified: Secondary | ICD-10-CM

## 2021-09-30 DIAGNOSIS — R7303 Prediabetes: Secondary | ICD-10-CM

## 2021-09-30 DIAGNOSIS — Z7189 Other specified counseling: Secondary | ICD-10-CM

## 2021-09-30 MED ORDER — ROSUVASTATIN CALCIUM 20 MG PO TABS: 20.0000 mg | ORAL_TABLET | Freq: Every day | ORAL | 3 refills | Status: DC

## 2021-09-30 NOTE — Assessment & Plan Note (Signed)
Chronic, continue to monitor.  ?

## 2021-09-30 NOTE — Progress Notes (Signed)
? ? Patient ID: Larry Kent, male    DOB: 30-Dec-1945, 76 y.o.   MRN: [id number] ? ?This visit was conducted in person. ? ?BP 134/74   Pulse 63   Temp (!) 97.5 ?F (36.4 ?C) (Temporal)   Ht 5' 5.75" (1.67 m)   Wt 185 lb 6 oz (84.1 kg)   SpO2 95%   BMI 30.15 kg/m?   ? ?CC: CPE ?Subjective:  ? ?HPI: ?Larry Kent is a 76 y.o. male presenting on 09/30/2021 for Medicare Wellness ? ? ?Did not see health advisor this year.  ? ?Hearing Screening  ? 500Hz  1000Hz  2000Hz  4000Hz   ?Right ear 20 20 20  0  ?Left ear 20 20 20  0  ?Vision Screening - Comments:: Last eye exam, 08/2021.  ?[place] Office Visit from 09/30/2021 in [facility] [place] at [facility]  ?PHQ-2 Total Score 0  ? ?  ?  ? ?  09/30/2021  ?  2:02 PM 09/16/2020  ?  3:27 PM 12/05/2019  ?  2:40 PM 07/14/2019  ?  2:01 PM 07/06/2018  ?  3:43 PM  ?Fall Risk   ?Falls in the past year? 0 0 0 0 0  ?Number falls in past yr:   0 0   ?Injury with Fall?   0 0   ?Comment   N/A- no falls reported    ?Risk for fall due to :   No Fall Risks No Fall Risks   ?Follow up   Falls prevention discussed Falls evaluation completed;Falls prevention discussed   ?Aortic stenosis monitored by cardiology with echo Q28yrs.  ?Carotid stenosis s/p R CEA 11/2019 (Dr Carlis Abbott [facility]).  ?Saw ENT and neurology Dr Melrose Nakayama for hearing loss - started on flonase. Also had reassuring memory evaluation.  ?  ?Preventative: ?COLONOSCOPY Date: ~2010 medium int hemorrhoids, o/w WNL, rpt 5 yrs given fmhx (Dr. Oletta Lamas) - discussed. cologuard normal 2018.  ?Colonoscopy 08/2019 - 5 TAs, diverticulosis, rpt 3 yrs ([facility]) ?Prostate screening - sees Dr Junious Silk yearly at [place]. H/o BPH on flomax, nocturia x3. Fmhx prostate cancer. H/o kidney stones as well.  ?Lung cancer screening - not eligible  ?Flu shot yearly  ?COVID vaccine Moderna 07/2019, 08/2019, booster 04/2020 ?Td - 2013  ?Pneumovax - 2013, prevnar-13 2015  ?Shingrix - discussed, declines  ?Advanced directives: packet provided today. Would want wife to be  HCPOA. Has at home - needs notarized and will bring [place] copy. Ok with CPR and temporary life support, would want family to to help decide if prolonged illness. Would be ok with feeding tube.  ?Seat belt use discussed ?Sunscreen use discussed. No changing moles on skin. Sees derm yearly.  ?Non smoker  ?Alcohol - rare  ?Sleep - averaging 8 hours/night ?Dentist yearly  ?Eye exam yearly  ?Bowel - no constipation  ?Bladder - no incontinence  ? ?Caffeine: 2-3 cups coffee/day ?Lives with wife and adult son, 1 dog ?Occupation: Editor, commissioning - still works part time ?Edu: college  ?Activity: golf, no regular exercise, occasionally walks with wife  ?Diet: good amt water, daily fruits/vegetables, fish several times a week  ?   ? ?Relevant past medical, surgical, family and social history reviewed and updated as indicated. Interim medical history since our last visit reviewed. ?Allergies and medications reviewed and updated. ?Outpatient Medications Prior to Visit  ?Medication Sig Dispense Refill  ? aspirin EC 81 MG tablet Take 1 tablet (81 mg total) by mouth daily. 90 tablet 3  ? fluticasone (FLONASE)  50 MCG/ACT nasal spray Place 1 spray into both nostrils daily as needed.    ? Multiple Vitamin (MULTIVITAMIN) tablet Take 1 tablet by mouth daily.    ? tamsulosin (FLOMAX) 0.4 MG CAPS capsule Take 1 capsule (0.4 mg total) by mouth daily.    ? Zinc 50 MG CAPS Take 1 capsule by mouth daily.    ? rosuvastatin (CRESTOR) 20 MG tablet TAKE 1 TABLET BY MOUTH EVERY DAY 90 tablet 0  ? ?No facility-administered medications prior to visit.  ?  ? ?Per HPI unless specifically indicated in ROS section below ?Review of Systems  ?Constitutional:  Negative for activity change, appetite change, chills, fatigue, fever and unexpected weight change.  ?HENT:  Negative for hearing loss.   ?Eyes:  Negative for visual disturbance.  ?Respiratory:  Positive for cough (allergy related). Negative for chest tightness, shortness of  breath and wheezing.   ?Cardiovascular:  Negative for chest pain, palpitations and leg swelling.  ?Gastrointestinal:  Negative for abdominal distention, abdominal pain, blood in stool, constipation, diarrhea, nausea and vomiting.  ?Genitourinary:  Negative for difficulty urinating and hematuria.  ?Musculoskeletal:  Negative for arthralgias, myalgias and neck pain.  ?Skin:  Negative for rash.  ?Neurological:  Negative for dizziness, seizures, syncope and headaches.  ?Hematological:  Negative for adenopathy. Does not bruise/bleed easily.  ?Psychiatric/Behavioral:  Negative for dysphoric mood. The patient is not nervous/anxious.   ? ?Objective:  ?BP 134/74   Pulse 63   Temp (!) 97.5 ?F (36.4 ?C) (Temporal)   Ht 5' 5.75" (1.67 m)   Wt 185 lb 6 oz (84.1 kg)   SpO2 95%   BMI 30.15 kg/m?   ?Wt Readings from Last 3 Encounters:  ?09/30/21 185 lb 6 oz (84.1 kg)  ?09/23/21 185 lb (83.9 kg)  ?09/09/21 189 lb 12.8 oz (86.1 kg)  ?  ?  ?Physical Exam ?Vitals and nursing note reviewed.  ?Constitutional:   ?   General: He is not in acute distress. ?   Appearance: Normal appearance. He is well-developed. He is not ill-appearing.  ?HENT:  ?   Head: Normocephalic and atraumatic.  ?   Right Ear: Hearing, tympanic membrane, ear canal and external ear normal.  ?   Left Ear: Hearing, tympanic membrane, ear canal and external ear normal.  ?Eyes:  ?   General: No scleral icterus. ?   Extraocular Movements: Extraocular movements intact.  ?   Conjunctiva/sclera: Conjunctivae normal.  ?   Pupils: Pupils are equal, round, and reactive to light.  ?Neck:  ?   Thyroid: No thyroid mass or thyromegaly.  ?   Vascular: Carotid bruit (mild s/p R CEA) present.  ?Cardiovascular:  ?   Rate and Rhythm: Normal rate and regular rhythm.  ?   Pulses: Normal pulses.     ?     Radial pulses are 2+ on the right side and 2+ on the left side.  ?   Heart sounds: Murmur (3/6 systolic throughout, heard best at USB) heard.  ?Pulmonary:  ?   Effort: Pulmonary  effort is normal. No respiratory distress.  ?   Breath sounds: Normal breath sounds. No wheezing, rhonchi or rales.  ?Abdominal:  ?   General: Bowel sounds are normal. There is no distension.  ?   Palpations: Abdomen is soft. There is no mass.  ?   Tenderness: There is no abdominal tenderness. There is no guarding or rebound.  ?   Hernia: No hernia is present.  ?Musculoskeletal:     ?  General: Normal range of motion.  ?   Cervical back: Normal range of motion and neck supple.  ?   Right lower leg: No edema.  ?   Left lower leg: No edema.  ?Lymphadenopathy:  ?   Cervical: No cervical adenopathy.  ?Skin: ?   General: Skin is warm and dry.  ?   Findings: No rash.  ?Neurological:  ?   General: No focal deficit present.  ?   Mental Status: He is alert and oriented to person, place, and time.  ?   Comments:  ?Recall 3/3 ?Calculation 5/5 DLROW   ?Psychiatric:     ?   Mood and Affect: Mood normal.     ?   Behavior: Behavior normal.     ?   Thought Content: Thought content normal.     ?   Judgment: Judgment normal.  ? ?   ?Results for orders placed or performed in visit on 09/23/21  ?PSA  ?Result Value Ref Range  ? PSA 1.59 0.10 - 4.00 ng/mL  ?Hemoglobin A1c  ?Result Value Ref Range  ? Hgb A1c MFr Bld 6.1 4.6 - 6.5 %  ?Comprehensive metabolic panel  ?Result Value Ref Range  ? Sodium 138 135 - 145 mEq/L  ? Potassium 4.1 3.5 - 5.1 mEq/L  ? Chloride 103 96 - 112 mEq/L  ? CO2 29 19 - 32 mEq/L  ? Glucose, Bld 101 (H) 70 - 99 mg/dL  ? BUN 15 6 - 23 mg/dL  ? Creatinine, Ser 1.03 0.40 - 1.50 mg/dL  ? Total Bilirubin 0.9 0.2 - 1.2 mg/dL  ? Alkaline Phosphatase 62 39 - 117 U/L  ? AST 21 0 - 37 U/L  ? ALT 16 0 - 53 U/L  ? Total Protein 6.9 6.0 - 8.3 g/dL  ? Albumin 4.1 3.5 - 5.2 g/dL  ? GFR 70.79 >60.00 mL/min  ? Calcium 8.9 8.4 - 10.5 mg/dL  ?Lipid panel  ?Result Value Ref Range  ? Cholesterol 133 0 - 200 mg/dL  ? Triglycerides 83.0 0.0 - 149.0 mg/dL  ? HDL 55.30 >39.00 mg/dL  ? VLDL 16.6 0.0 - 40.0 mg/dL  ? LDL Cholesterol 61 0 -  99 mg/dL  ? Total CHOL/HDL Ratio 2   ? NonHDL 77.48   ? ? ?Assessment & Plan:  ? ?Problem List Items Addressed This Visit   ? ? Medicare annual wellness visit, subsequent - Primary (Chronic)  ?  I have personally reviewed t

## 2021-09-30 NOTE — Patient Instructions (Addendum)
If interested, check with pharmacy about new 2 shot shingles series (shingrix).  ?Work on living will, bring [place] a copy when complete.  ?You are doing well today ?Continue current medicines. ?Return as needed or in 1 year for next physical.  ? ?Health Maintenance After Age 76 ?After age 68, you are at a higher risk for certain long-term diseases and infections as well as injuries from falls. Falls are a major cause of broken bones and head injuries in people who are older than age 15. Getting regular preventive care can help to keep you healthy and well. Preventive care includes getting regular testing and making lifestyle changes as recommended by your health care provider. Talk with your health care provider about: ?Which screenings and tests you should have. A screening is a test that checks for a disease when you have no symptoms. ?A diet and exercise plan that is right for you. ?What should I know about screenings and tests to prevent falls? ?Screening and testing are the best ways to find a health problem early. Early diagnosis and treatment give you the best chance of managing medical conditions that are common after age 61. Certain conditions and lifestyle choices may make you more likely to have a fall. Your health care provider may recommend: ?Regular vision checks. Poor vision and conditions such as cataracts can make you more likely to have a fall. If you wear glasses, make sure to get your prescription updated if your vision changes. ?Medicine review. Work with your health care provider to regularly review all of the medicines you are taking, including over-the-counter medicines. Ask your health care provider about any side effects that may make you more likely to have a fall. Tell your health care provider if any medicines that you take make you feel dizzy or sleepy. ?Strength and balance checks. Your health care provider may recommend certain tests to check your strength and balance while standing,  walking, or changing positions. ?Foot health exam. Foot pain and numbness, as well as not wearing proper footwear, can make you more likely to have a fall. ?Screenings, including: ?Osteoporosis screening. Osteoporosis is a condition that causes the bones to get weaker and break more easily. ?Blood pressure screening. Blood pressure changes and medicines to control blood pressure can make you feel dizzy. ?Depression screening. You may be more likely to have a fall if you have a fear of falling, feel depressed, or feel unable to do activities that you used to do. ?Alcohol use screening. Using too much alcohol can affect your balance and may make you more likely to have a fall. ?Follow these instructions at home: ?Lifestyle ?Do not drink alcohol if: ?Your health care provider tells you not to drink. ?If you drink alcohol: ?Limit how much you have to: ?0-1 drink a day for women. ?0-2 drinks a day for men. ?Know how much alcohol is in your drink. In the [place] one drink equals one 12 oz bottle of beer (355 mL), one 5 oz glass of wine (148 mL), or one 1? oz glass of hard liquor (44 mL). ?Do not use any products that contain nicotine or tobacco. These products include cigarettes, chewing tobacco, and vaping devices, such as e-cigarettes. If you need help quitting, ask your health care provider. ?Activity ? ?Follow a regular exercise program to stay fit. This will help you maintain your balance. Ask your health care provider what types of exercise are appropriate for you. ?If you need a cane or walker, use it  as recommended by your health care provider. ?Wear supportive shoes that have nonskid soles. ?Safety ? ?Remove any tripping hazards, such as rugs, cords, and clutter. ?Install safety equipment such as grab bars in bathrooms and safety rails on stairs. ?Keep rooms and walkways well-lit. ?General instructions ?Talk with your health care provider about your risks for falling. Tell your health care provider if: ?You fall.  Be sure to tell your health care provider about all falls, even ones that seem minor. ?You feel dizzy, tiredness (fatigue), or off-balance. ?Take over-the-counter and prescription medicines only as told by your health care provider. These include supplements. ?Eat a healthy diet and maintain a healthy weight. A healthy diet includes low-fat dairy products, low-fat (lean) meats, and fiber from whole grains, beans, and lots of fruits and vegetables. ?Stay current with your vaccines. ?Schedule regular health, dental, and eye exams. ?Summary ?Having a healthy lifestyle and getting preventive care can help to protect your health and wellness after age 35. ?Screening and testing are the best way to find a health problem early and help you avoid having a fall. Early diagnosis and treatment give you the best chance for managing medical conditions that are more common for people who are older than age 37. ?Falls are a major cause of broken bones and head injuries in people who are older than age 57. Take precautions to prevent a fall at home. ?Work with your health care provider to learn what changes you can make to improve your health and wellness and to prevent falls. ?This information is not intended to replace advice given to you by your health care provider. Make sure you discuss any questions you have with your health care provider. ?Document Revised: 11/04/2020 Document Reviewed: 11/04/2020 ?Elsevier Patient Education ? [place]. ? ?

## 2021-09-30 NOTE — Assessment & Plan Note (Signed)
Chronic, stable period on flomax daily followed by Dr Junious Silk urology  ?

## 2021-09-30 NOTE — Assessment & Plan Note (Addendum)
Ongoing murmur followed by cardiology, latest echo 2022 showed mild AS. Discussed symptoms that would suggest progression.  ?

## 2021-09-30 NOTE — Assessment & Plan Note (Addendum)
Chronic, stable. Continue crestor 20mg  daily. ?The 10-year ASCVD risk score (Arnett DK, et al., 2019) is: 24.5% ?  Values used to calculate the score: ?    Age: 76 years ?    Sex: Male ?    Is Non-Hispanic African American: No ?    Diabetic: No ?    Tobacco smoker: No ?    Systolic Blood Pressure: 762 mmHg ?    Is BP treated: No ?    HDL Cholesterol: 55.3 mg/dL ?    Total Cholesterol: 133 mg/dL  ?

## 2021-09-30 NOTE — Assessment & Plan Note (Signed)
Advanced directives: packet provided today. Would want wife to be HCPOA. Has at home - needs notarized and will bring [place] copy. Ok with CPR and temporary life support, would want family to to help decide if prolonged illness. Would be ok with feeding tube.  ?

## 2021-09-30 NOTE — Assessment & Plan Note (Addendum)
S/p R CEA6/2021  followed by [facility].  ?Minimal L sided blockage ?

## 2021-09-30 NOTE — Assessment & Plan Note (Signed)

## 2021-09-30 NOTE — Assessment & Plan Note (Signed)
Continue aspirin, statin.  

## 2021-09-30 NOTE — Assessment & Plan Note (Signed)
Stable period.  

## 2021-09-30 NOTE — Assessment & Plan Note (Signed)
Preventative protocols reviewed and updated unless pt declined. Discussed healthy diet and lifestyle.  

## 2021-10-13 DIAGNOSIS — Z08 Encounter for follow-up examination after completed treatment for malignant neoplasm: Secondary | ICD-10-CM | POA: Diagnosis not present

## 2021-10-13 DIAGNOSIS — D485 Neoplasm of uncertain behavior of skin: Secondary | ICD-10-CM | POA: Diagnosis not present

## 2021-10-13 DIAGNOSIS — R229 Localized swelling, mass and lump, unspecified: Secondary | ICD-10-CM | POA: Diagnosis not present

## 2021-10-13 DIAGNOSIS — Z85828 Personal history of other malignant neoplasm of skin: Secondary | ICD-10-CM | POA: Diagnosis not present

## 2021-10-13 DIAGNOSIS — C44329 Squamous cell carcinoma of skin of other parts of face: Secondary | ICD-10-CM | POA: Diagnosis not present

## 2021-10-13 DIAGNOSIS — C44319 Basal cell carcinoma of skin of other parts of face: Secondary | ICD-10-CM | POA: Diagnosis not present

## 2021-10-13 DIAGNOSIS — C44229 Squamous cell carcinoma of skin of left ear and external auricular canal: Secondary | ICD-10-CM | POA: Diagnosis not present

## 2021-12-15 DIAGNOSIS — C44229 Squamous cell carcinoma of skin of left ear and external auricular canal: Secondary | ICD-10-CM | POA: Diagnosis not present

## 2021-12-31 DIAGNOSIS — C44319 Basal cell carcinoma of skin of other parts of face: Secondary | ICD-10-CM | POA: Diagnosis not present

## 2022-01-07 DIAGNOSIS — M1711 Unilateral primary osteoarthritis, right knee: Secondary | ICD-10-CM | POA: Diagnosis not present

## 2022-01-14 DIAGNOSIS — M1711 Unilateral primary osteoarthritis, right knee: Secondary | ICD-10-CM | POA: Diagnosis not present

## 2022-01-21 DIAGNOSIS — M1711 Unilateral primary osteoarthritis, right knee: Secondary | ICD-10-CM | POA: Diagnosis not present

## 2022-03-18 DIAGNOSIS — C4441 Basal cell carcinoma of skin of scalp and neck: Secondary | ICD-10-CM | POA: Diagnosis not present

## 2022-03-18 DIAGNOSIS — D485 Neoplasm of uncertain behavior of skin: Secondary | ICD-10-CM | POA: Diagnosis not present

## 2022-03-18 DIAGNOSIS — C44329 Squamous cell carcinoma of skin of other parts of face: Secondary | ICD-10-CM | POA: Diagnosis not present

## 2022-04-27 ENCOUNTER — Other Ambulatory Visit: Payer: Self-pay | Admitting: Family Medicine

## 2022-04-29 DIAGNOSIS — C4441 Basal cell carcinoma of skin of scalp and neck: Secondary | ICD-10-CM | POA: Diagnosis not present

## 2022-05-08 IMAGING — MR MR BRAIN/IAC WO/W CM
9 of 13 series · 24 of 48 positions shown · IV contrast (7.5ml Gadavist)
Comparison: CTA neck 10/16/2019.  Head CT 08/13/2015.

CLINICAL DATA: 74-year-old male with dizziness, ringing in both
ears, decreased left side hearing. Chronic but progressive symptoms.

EXAM:
MRI HEAD WITHOUT AND WITH CONTRAST
TECHNIQUE: Multiplanar, multiecho pulse sequences of the brain and surrounding
structures were obtained without and with intravenous contrast.
CONTRAST:  7.5mL GADAVIST GADOBUTROL 1 MMOL/ML IV SOLN

[Series 5: T1 · sagittal · 5.0mm · 0.62mm/px · 2 of 25 slices shown (1 of 3)]
[im 1/25]
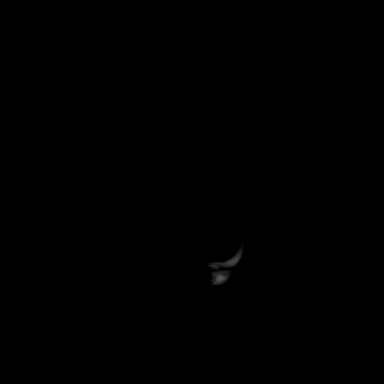
[im 25/25]
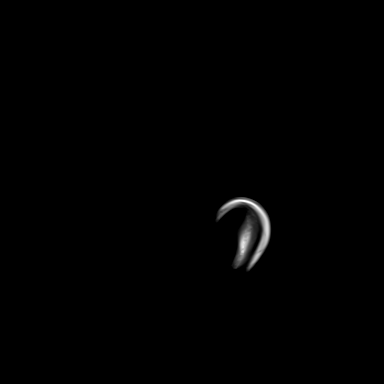

[Series 6: ax dwi_tracew · axial · 3.0mm · 0.60mm/px · z∈[-70,+84]mm · 4 of 48 slices shown]
[im 1/48]
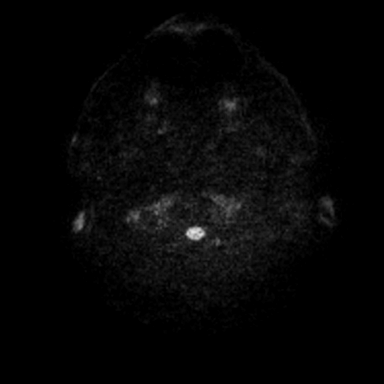
[im 16/48]
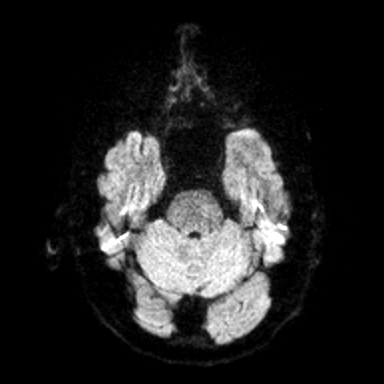
[im 32/48]
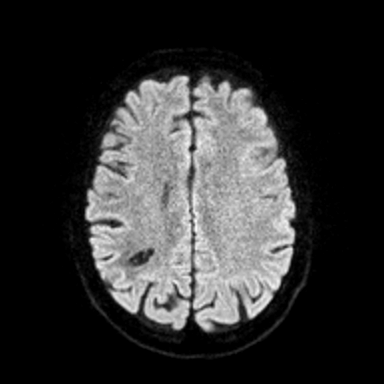
[im 48/48]
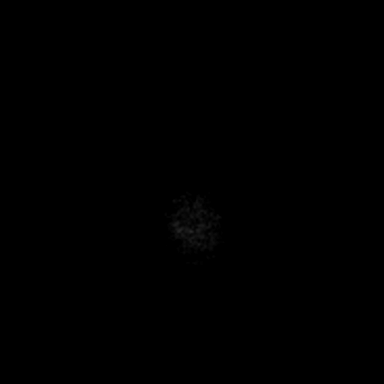

[Series 12: T2 · axial · 5.0mm · 0.53mm/px · z∈[-63,+80]mm · 2 of 25 slices shown]
[im 1/25]
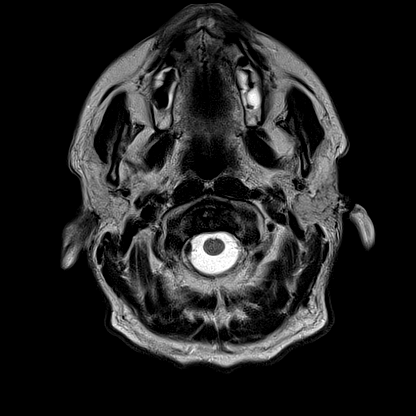
[im 25/25]
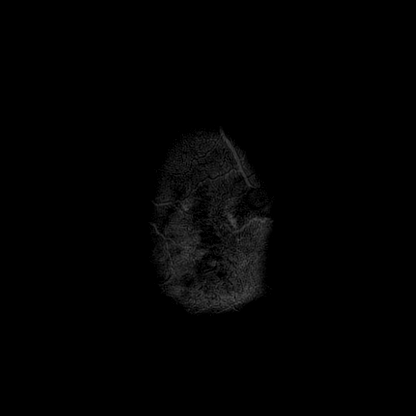

[Series 13: FLAIR · axial · 3.0mm · 0.53mm/px · z∈[-72,+89]mm · 4 of 55 slices shown]
[im 1/55]
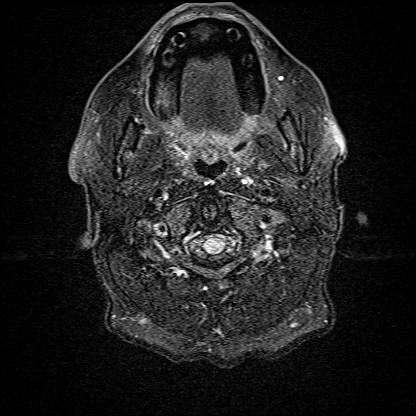
[im 19/55]
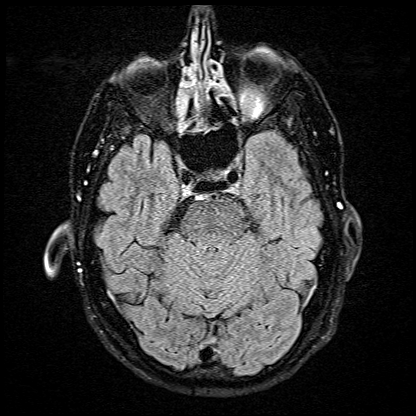
[im 37/55]
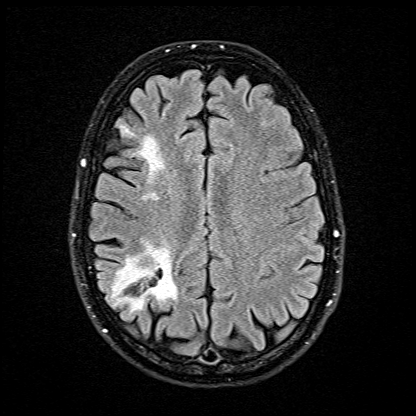
[im 55/55]
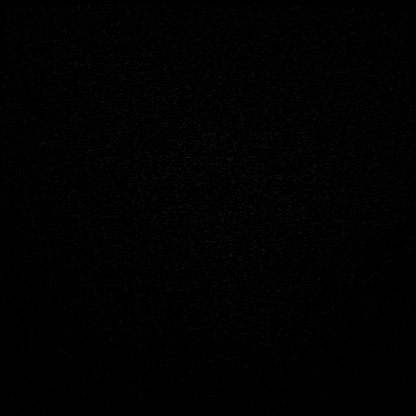

[Series 14: T1 · coronal · non-contrast · 3.0mm · 0.21mm/px · 1 of 13 slices shown (2 of 3)]
[im 1/13]
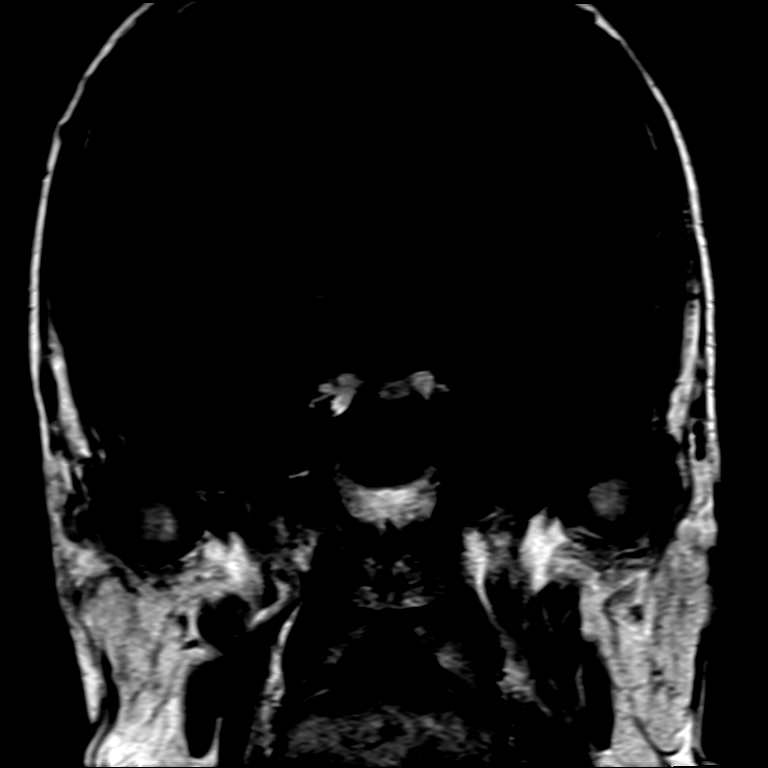

[Series 16: T1 · axial · non-contrast · 3.0mm · 0.21mm/px · 1 of 15 slices shown (3 of 3)]
[im 1/15]
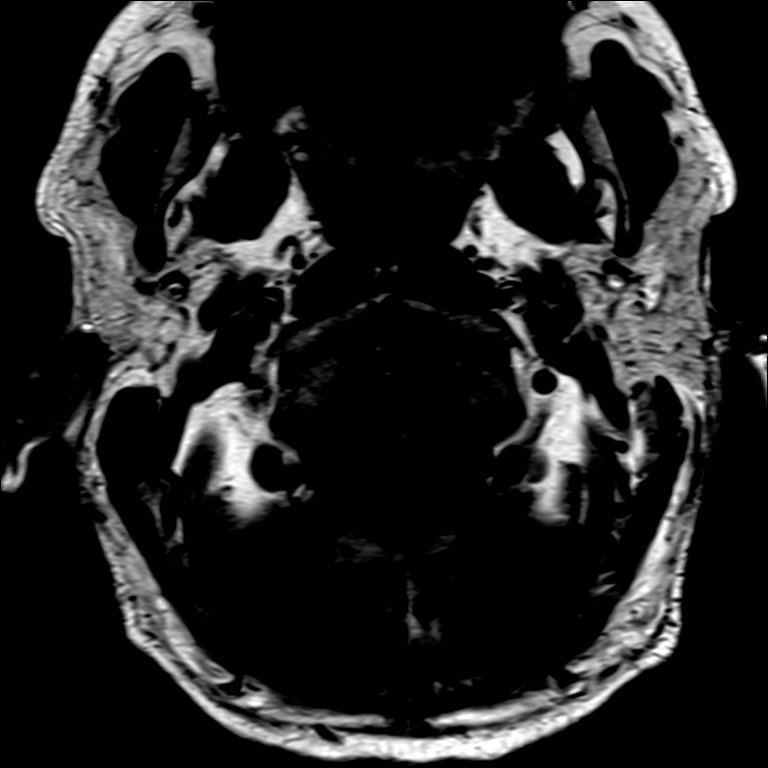

[Series 17: T1 post-contrast · axial · 3.0mm · 0.21mm/px · 1 of 15 slices shown (1 of 3)]
[im 1/15]
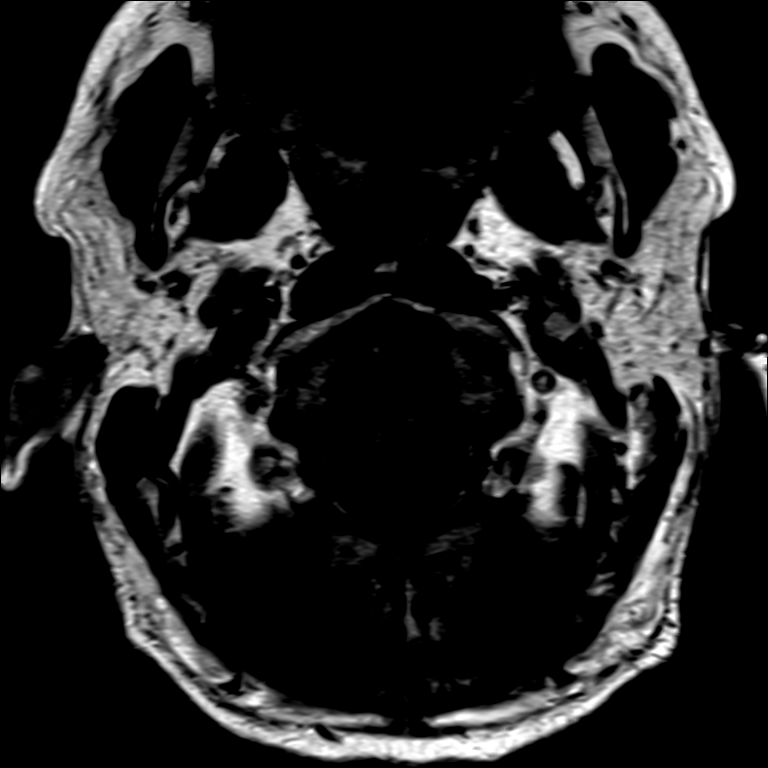

[Series 18: T1 post-contrast · coronal · 3.0mm · 0.21mm/px · 1 of 13 slices shown (2 of 3)]
[im 1/13]
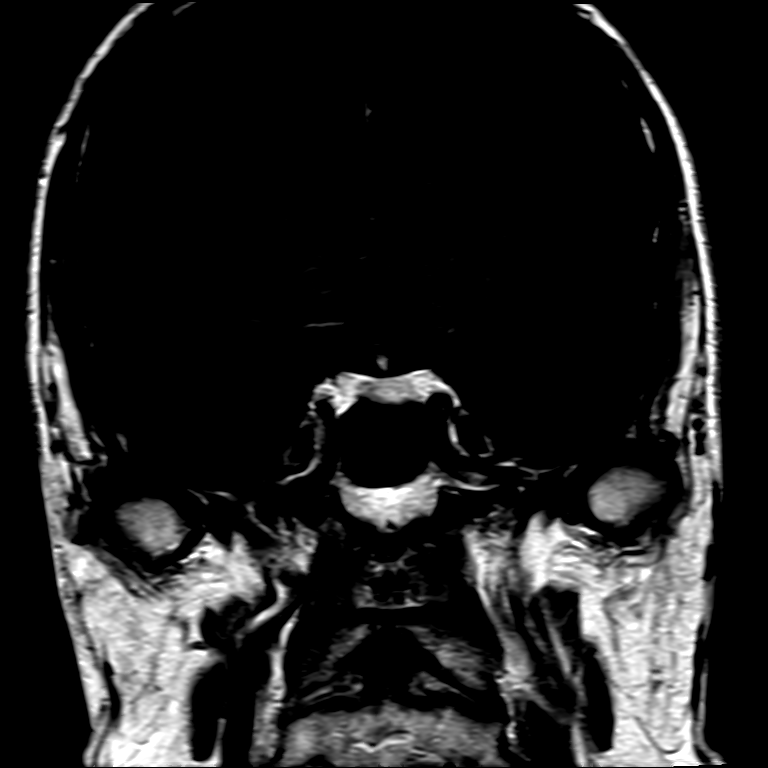

[Series 19: T1 post-contrast · axial · 1.0mm · 0.98mm/px · z∈[-79,+95]mm · 8 of 176 slices shown (3 of 3)]
[im 1/176]
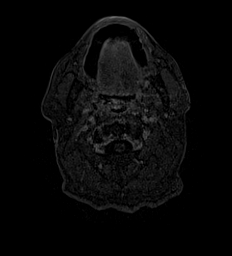
[im 27/176]
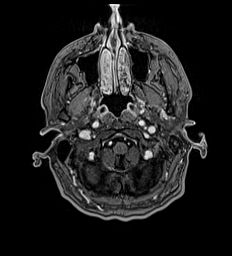
[im 54/176]
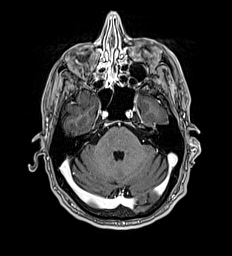
[im 81/176]
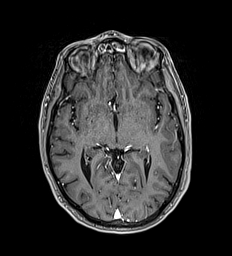
[im 95/176]
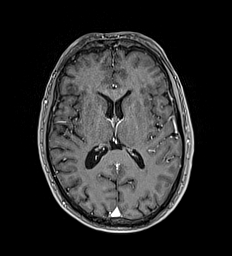
[im 122/176]
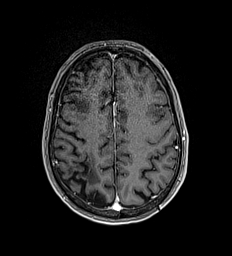
[im 149/176]
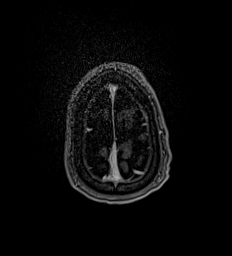
[im 176/176]
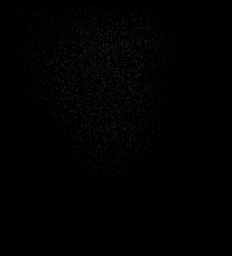

[24 of 48 positions shown; findings below may reference images not displayed]

FINDINGS: Brain: Multifocal cortical encephalomalacia in the right hemisphere
has substantially progressed from the 1566 head CT. Confluent
chronic encephalomalacia and white matter gliosis on series 13,
image 40, with mild to moderate associated hemosiderin on SWI
imaging. There is scattered, occasionally patchy (left periatrial)
additional nonspecific cerebral white matter T2 and FLAIR
hyperintensity. Evidence of a chronic microhemorrhage also in the
right occipital lobe on series 10, image 32.

No superimposed No restricted diffusion to suggest acute infarction.
No midline shift, mass effect, evidence of mass lesion,
ventriculomegaly, extra-axial collection or acute intracranial
hemorrhage. Cervicomedullary junction and pituitary are within
normal limits. The deep gray nuclei, brainstem and cerebellum remain
normal. No abnormal enhancement identified. No dural thickening
identified.

Vascular: Major intracranial vascular flow voids are preserved, with
dominant right vertebral artery stable from the CTA this year.

Skull and upper cervical spine: Negative for age visible cervical
spine. Visualized bone marrow signal is within normal limits.

Sinuses/Orbits: Postoperative changes to both globes, otherwise
negative orbits. Mild to moderate bilateral paranasal sinus mucosal
thickening, sparing the sphenoid sinuses. No sinus fluid level.

Other: Dedicated internal auditory imaging. Normal cerebellopontine
angles. Normal bilateral cisternal and intracanalicular 7th and 8th
cranial nerve segments. Symmetric T2 signal in the bilateral cochlea
and vestibular structures. Mastoids are clear. No abnormal
enhancement identified. Stylomastoid foramen appear normal. Parotid
glands appear normal. No skull base abnormality identified.
IMPRESSION: 1. Normal internal auditory imaging.
2. No acute intracranial abnormality, but progressed multifocal
chronic ischemia in the right MCA territory since 1566. Some
associated chronic cerebral blood products. Mild additional
bilateral white matter disease is nonspecific but most commonly due
to small vessel ischemia.
3. Mild to moderate new paranasal sinus disease since [DATE].

## 2022-06-11 DIAGNOSIS — L570 Actinic keratosis: Secondary | ICD-10-CM | POA: Diagnosis not present

## 2022-06-11 DIAGNOSIS — R229 Localized swelling, mass and lump, unspecified: Secondary | ICD-10-CM | POA: Diagnosis not present

## 2022-06-11 DIAGNOSIS — L928 Other granulomatous disorders of the skin and subcutaneous tissue: Secondary | ICD-10-CM | POA: Diagnosis not present

## 2022-06-11 DIAGNOSIS — C44329 Squamous cell carcinoma of skin of other parts of face: Secondary | ICD-10-CM | POA: Diagnosis not present

## 2022-06-11 DIAGNOSIS — L821 Other seborrheic keratosis: Secondary | ICD-10-CM | POA: Diagnosis not present

## 2022-06-11 DIAGNOSIS — Z85828 Personal history of other malignant neoplasm of skin: Secondary | ICD-10-CM | POA: Diagnosis not present

## 2022-06-11 DIAGNOSIS — D485 Neoplasm of uncertain behavior of skin: Secondary | ICD-10-CM | POA: Diagnosis not present

## 2022-06-11 DIAGNOSIS — C44622 Squamous cell carcinoma of skin of right upper limb, including shoulder: Secondary | ICD-10-CM | POA: Diagnosis not present

## 2022-06-11 DIAGNOSIS — Z08 Encounter for follow-up examination after completed treatment for malignant neoplasm: Secondary | ICD-10-CM | POA: Diagnosis not present

## 2022-06-11 DIAGNOSIS — L578 Other skin changes due to chronic exposure to nonionizing radiation: Secondary | ICD-10-CM | POA: Diagnosis not present

## 2022-06-11 DIAGNOSIS — Z4801 Encounter for change or removal of surgical wound dressing: Secondary | ICD-10-CM | POA: Diagnosis not present

## 2022-07-09 DIAGNOSIS — N320 Bladder-neck obstruction: Secondary | ICD-10-CM | POA: Diagnosis not present

## 2022-07-09 DIAGNOSIS — R3912 Poor urinary stream: Secondary | ICD-10-CM | POA: Diagnosis not present

## 2022-07-09 DIAGNOSIS — K5730 Diverticulosis of large intestine without perforation or abscess without bleeding: Secondary | ICD-10-CM | POA: Diagnosis not present

## 2022-07-09 DIAGNOSIS — R1084 Generalized abdominal pain: Secondary | ICD-10-CM | POA: Diagnosis not present

## 2022-07-09 DIAGNOSIS — N200 Calculus of kidney: Secondary | ICD-10-CM | POA: Diagnosis not present

## 2022-07-10 ENCOUNTER — Telehealth: Payer: Self-pay | Admitting: Pulmonary Disease

## 2022-07-10 ENCOUNTER — Encounter: Payer: Self-pay | Admitting: Adult Health

## 2022-07-10 DIAGNOSIS — R918 Other nonspecific abnormal finding of lung field: Secondary | ICD-10-CM

## 2022-07-10 NOTE — Telephone Encounter (Signed)
Hi Larry Kent,  I was called by Urology for his CT urogram which showed multiple bilateral pulmonary nodules in the lower lung fields concerning for metastatic cancer. He has history of skin cancers.   Please order a CT PET scan with super D protocol stat and schedule him follow up with Dr. Thora Lance, Dr. Delton Coombes or Dr. Tonia Brooms for further discussion on need for bronchoscopy.   His Urology team has notified him that we will be reaching out to him to schedule these scans and appointments.  Dr. Delton Coombes, Dr. Tonia Brooms and Dr. Thora Lance have been cc'd on this message.   Thanks Cletis Athens

## 2022-07-10 NOTE — Telephone Encounter (Signed)
Called and spoke with patient. I was able to get him scheduled with Dr. Tonia Brooms for 07/16/21 at 130pm as this was the best date and time for him. He verbalized understanding of our address and contact information.   He is also aware that he will receive another phone in regards to the CT scan.   Nothing further needed at time of call.

## 2022-07-15 ENCOUNTER — Encounter (HOSPITAL_COMMUNITY)
Admission: RE | Admit: 2022-07-15 | Discharge: 2022-07-15 | Disposition: A | Discharge status: Home / Self Care | Payer: Medicare Other | Source: Ambulatory Visit | Attending: Pulmonary Disease | Admitting: Pulmonary Disease

## 2022-07-15 DIAGNOSIS — R918 Other nonspecific abnormal finding of lung field: Secondary | ICD-10-CM | POA: Diagnosis not present

## 2022-07-15 DIAGNOSIS — J90 Pleural effusion, not elsewhere classified: Secondary | ICD-10-CM | POA: Diagnosis not present

## 2022-07-15 LAB — GLUCOSE, CAPILLARY: Glucose-Capillary: 111 mg/dL — ABNORMAL HIGH (ref 70–99)

## 2022-07-15 MED ORDER — FLUDEOXYGLUCOSE F - 18 (FDG) INJECTION
8.3000 | Freq: Once | INTRAVENOUS | Status: AC
  Administered 2022-07-15: 9.18 via INTRAVENOUS

## 2022-07-16 ENCOUNTER — Ambulatory Visit: Payer: Medicare Other | Admitting: Pulmonary Disease

## 2022-07-16 ENCOUNTER — Encounter: Payer: Self-pay | Admitting: Pulmonary Disease

## 2022-07-16 VITALS — BP 110/60 | HR 89 | Ht 68.0 in | Wt 186.0 lb

## 2022-07-16 DIAGNOSIS — R599 Enlarged lymph nodes, unspecified: Secondary | ICD-10-CM | POA: Diagnosis not present

## 2022-07-16 DIAGNOSIS — R911 Solitary pulmonary nodule: Secondary | ICD-10-CM

## 2022-07-16 NOTE — Progress Notes (Signed)
Synopsis: Referred in January 2024 for multiple pulmonary nodules by [name], MD  Subjective:   PATIENT ID: Larry Kent GENDER: male DOB: 1946/06/29, MRN: [id number]  Chief Complaint  Patient presents with   Consult    Lung mass    This is a 77 year old gentleman, past medical history of skin cancer, family history of colon cancer and prostate cancer.  Patient was having some constipation issues difficulty going to the restroom and also urination trouble had a urogram CT that showed pulmonary nodules.  He had workup completed with a CT chest as well as pet imaging that revealed innumerable number of pulmonary nodules concerning for widely metastatic disease and hypermetabolic adenopathy within the chest presumed metastatic disease with bony lesions.  Unknown primary.  Patient was referred today to discuss bronchoscopy and biopsy for confirmation of malignancy.    Past Medical History:  Diagnosis Date   Allergy    Arthritis    both knees    BCC (basal cell carcinoma), arm, right 09/2019   MOHS ([place])   BCC (basal cell carcinoma), face 2014   L preauricular s/p MOHS   BCC (basal cell carcinoma), face 02/2018   L upper lip   BPH (benign prostatic hypertrophy)    Dr. Mena Goes @ [facility]   Carotid stenosis 05/2015   RICA 40-59%, LICA 1-39%, L vertebral occlusion, rpt 1 yr   Cataract    removed both eyes    FHx: colon cancer    FHx: prostate cancer    Heart murmur    mild aortic stenosis    Hyperlipidemia    borderline- on rosuvastatin now normal    Nephrolithiasis 09/2012   Occlusion of right vertebral artery 05/2015     Family History  Problem Relation Age of Onset   Cancer Father 84       prostate   Coronary artery disease Father 47       MI   Prostate cancer Father    Cancer Paternal Grandfather 5       prostate   Prostate cancer Paternal Grandfather    Stroke Mother    Hypertension Mother    Cancer Mother 89       colon   Colon cancer Mother     Coronary artery disease Maternal Uncle    Cancer Sister 29       breast   Breast cancer Sister    Colon cancer Maternal Grandmother    Diabetes Neg Hx    Colon polyps Neg Hx    Esophageal cancer Neg Hx    Rectal cancer Neg Hx    Stomach cancer Neg Hx      Past Surgical History:  Procedure Laterality Date   BASAL CELL CARCINOMA EXCISION  05/2018   lip, 2020 x3  basal cells removed    CATARACT EXTRACTION  2008   Left   CATARACT EXTRACTION  2013   Right (Eppes)   COLONOSCOPY  ~2010   medium int hemorrhoids, o/w WNL, rpt 5 yrs given fmhx (Dr. Randa Evens)   COLONOSCOPY  08/2019   TAs, diverticulosis, rpt 3 yrs (Armbruster)   ENDARTERECTOMY Right 12/01/2019   Procedure: RIGHT ENDARTERECTOMY CAROTID;  Surgeon: Cephus Shelling, MD;  Location: [facility] OR;  Service: Vascular;  Laterality: Right;   exercise treadmill  11/2005   WNL Eldridge Dace)   HERNIA REPAIR  2008   Right   MOHS SURGERY Left spring 2014   basal cell face   PATCH ANGIOPLASTY Right 12/01/2019  Procedure: PATCH ANGIOPLASTY USING Livia Snellen BIOLOGIC PATCH;  Surgeon: Cephus Shelling, MD;  Location: [facility] OR;  Service: Vascular;  Laterality: Right;   Testicular Biopsy  2003   benign, varicocele   US ECHOCARDIOGRAPHY  11/2005   aortic sclerosis, EF 55-60%, diastolic dysfunction    Social History   Socioeconomic History   Marital status: Married    Spouse name: Not on file   Number of children: Not on file   Years of education: Not on file   Highest education level: Not on file  Occupational History   Not on file  Tobacco Use   Smoking status: Never   Smokeless tobacco: Never  Vaping Use   Vaping Use: Never used  Substance and Sexual Activity   Alcohol use: Yes    Alcohol/week: 0.0 standard drinks of alcohol    Comment: rare   Drug use: No   Sexual activity: Not on file  Other Topics Concern   Not on file  Social History Narrative   Caffeine: 2-3 cups coffee/day   Lives with wife and adult son, 1 dog    Occupation: Warden/ranger   Edu: college   Activity: golf, no regular exercise, occasionally walks with wife   Diet: good amt water, daily fruits/vegetables, fish several times a week   Social Determinants of Health   Financial Resource Strain: Low Risk  (07/14/2019)   Overall Financial Resource Strain (CARDIA)    Difficulty of Paying Living Expenses: Not hard at all  Food Insecurity: No Food Insecurity (12/05/2019)   Hunger Vital Sign    Worried About Running Out of Food in the Last Year: Never true    Ran Out of Food in the Last Year: Never true  Transportation Needs: No Transportation Needs (12/05/2019)   PRAPARE - Administrator, Civil Service (Medical): No    Lack of Transportation (Non-Medical): No  Physical Activity: Inactive (07/14/2019)   Exercise Vital Sign    Days of Exercise per Week: 0 days    Minutes of Exercise per Session: 0 min  Stress: No Stress Concern Present (07/14/2019)   [employer] of Occupational Health - Occupational Stress Questionnaire    Feeling of Stress : Not at all  Social Connections: Not on file  Intimate Partner Violence: Not At Risk (07/14/2019)   Humiliation, Afraid, Rape, and Kick questionnaire    Fear of Current or Ex-Partner: No    Emotionally Abused: No    Physically Abused: No    Sexually Abused: No     No Known Allergies   Outpatient Medications Prior to Visit  Medication Sig Dispense Refill   fluticasone (FLONASE) 50 MCG/ACT nasal spray Place 1 spray into both nostrils daily as needed.     aspirin EC 81 MG tablet Take 1 tablet (81 mg total) by mouth daily. 90 tablet 3   Multiple Vitamin (MULTIVITAMIN) tablet Take 1 tablet by mouth daily.     rosuvastatin (CRESTOR) 20 MG tablet TAKE 1 TABLET BY MOUTH EVERY DAY 90 tablet 1   tamsulosin (FLOMAX) 0.4 MG CAPS capsule Take 1 capsule (0.4 mg total) by mouth daily.     Zinc 50 MG CAPS Take 1 capsule by mouth daily.     No facility-administered medications prior to  visit.    Review of Systems  Constitutional:  Negative for chills, fever, malaise/fatigue and weight loss.  HENT:  Negative for hearing loss, sore throat and tinnitus.   Eyes:  Negative for blurred vision and double  vision.  Respiratory:  Negative for cough, hemoptysis, sputum production, shortness of breath, wheezing and stridor.   Cardiovascular:  Negative for chest pain, palpitations, orthopnea, leg swelling and PND.  Gastrointestinal:  Negative for abdominal pain, constipation, diarrhea, heartburn, nausea and vomiting.  Genitourinary:  Negative for dysuria, hematuria and urgency.  Musculoskeletal:  Negative for joint pain and myalgias.  Skin:  Negative for itching and rash.  Neurological:  Negative for dizziness, tingling, weakness and headaches.  Endo/Heme/Allergies:  Negative for environmental allergies. Does not bruise/bleed easily.  Psychiatric/Behavioral:  Negative for depression. The patient is not nervous/anxious and does not have insomnia.   All other systems reviewed and are negative.    Objective:  Physical Exam Vitals reviewed.  Constitutional:      General: He is not in acute distress.    Appearance: He is well-developed.  HENT:     Head: Normocephalic and atraumatic.  Eyes:     General: No scleral icterus.    Conjunctiva/sclera: Conjunctivae normal.     Pupils: Pupils are equal, round, and reactive to light.  Neck:     Vascular: No JVD.     Trachea: No tracheal deviation.  Cardiovascular:     Rate and Rhythm: Normal rate and regular rhythm.     Heart sounds: Normal heart sounds. No murmur heard. Pulmonary:     Effort: Pulmonary effort is normal. No tachypnea, accessory muscle usage or respiratory distress.     Breath sounds: No stridor. No wheezing, rhonchi or rales.  Abdominal:     General: There is no distension.     Palpations: Abdomen is soft.     Tenderness: There is no abdominal tenderness.  Musculoskeletal:        General: No tenderness.      Cervical back: Neck supple.  Lymphadenopathy:     Cervical: No cervical adenopathy.  Skin:    General: Skin is warm and dry.     Capillary Refill: Capillary refill takes less than 2 seconds.     Findings: No rash.  Neurological:     Mental Status: He is alert and oriented to person, place, and time.  Psychiatric:        Behavior: Behavior normal.      Vitals:   07/16/22 1337  BP: 110/60  Pulse: 89  SpO2: 96%  Weight: 186 lb (84.4 kg)  Height: 5\' 8"  (1.727 m)   96% on RA BMI Readings from Last 3 Encounters:  07/16/22 28.28 kg/m  09/30/21 30.15 kg/m  09/23/21 36.13 kg/m   Wt Readings from Last 3 Encounters:  07/16/22 186 lb (84.4 kg)  09/30/21 185 lb 6 oz (84.1 kg)  09/23/21 185 lb (83.9 kg)     CBC    Component Value Date/Time   WBC 4.1 09/09/2020 0820   RBC 4.07 (L) 09/09/2020 0820   HGB 14.1 09/09/2020 0820   HCT 41.3 09/09/2020 0820   PLT 203.0 09/09/2020 0820   MCV 101.6 (H) 09/09/2020 0820   MCH 34.6 (H) 12/02/2019 0614   MCHC 34.1 09/09/2020 0820   RDW 13.4 09/09/2020 0820   LYMPHSABS 1.3 09/09/2020 0820   MONOABS 0.5 09/09/2020 0820   EOSABS 0.2 09/09/2020 0820   BASOSABS 0.1 09/09/2020 0820     Chest Imaging:  Nuclear medicine pet imaging super D CT chest imaging: Innumerable number of pulmonary nodules and adenopathy concerning for widely metastatic disease. The patient's images have been independently reviewed by me.    Pulmonary Functions Testing Results:  No data to display          FeNO:   Pathology:   Echocardiogram:   Heart Catheterization:     Assessment & Plan:     ICD-10-CM   1. Lung nodule  R91.1 Ambulatory referral to Pulmonology    Procedural/ Surgical Case Request: VIDEO BRONCHOSCOPY WITH ENDOBRONCHIAL ULTRASOUND, ROBOTIC ASSISTED NAVIGATIONAL BRONCHOSCOPY    2. Adenopathy  R59.9 Ambulatory referral to Pulmonology    Procedural/ Surgical Case Request: VIDEO BRONCHOSCOPY WITH ENDOBRONCHIAL ULTRASOUND,  ROBOTIC ASSISTED NAVIGATIONAL BRONCHOSCOPY      Discussion:  This is a 77 year old gentleman no known primary cancers in the past except for skin cancer.  No family history of lung cancer patient is a lifelong non-smoker.  Recent PET scan with innumerable number of pulmonary nodules and hypermetabolic prostate gland.  Overall is concerning for widely metastatic disease to the chest as well as bony metastasis.  Plan: Today in the office we talked about the risk-benefit alternatives proceeding with bronchoscopy. I believe will contribute to getting answer with videobronchoscopy endobronchial ultrasound transbronchial needle aspiration of subcarinal nodes. Patient is agreeable to this. We talked about the risk of bleeding and pneumothorax. Tentative bronchoscopy date will 07/28/2022. He has already had a PET scan. Will get him set up with medical oncology as soon as we have a tissue diagnosis.    Current Outpatient Medications:    fluticasone (FLONASE) 50 MCG/ACT nasal spray, Place 1 spray into both nostrils daily as needed., Disp: , Rfl:    aspirin EC 81 MG tablet, Take 1 tablet (81 mg total) by mouth daily., Disp: 90 tablet, Rfl: 3   Multiple Vitamin (MULTIVITAMIN) tablet, Take 1 tablet by mouth daily., Disp: , Rfl:    rosuvastatin (CRESTOR) 20 MG tablet, TAKE 1 TABLET BY MOUTH EVERY DAY, Disp: 90 tablet, Rfl: 1   tamsulosin (FLOMAX) 0.4 MG CAPS capsule, Take 1 capsule (0.4 mg total) by mouth daily., Disp: , Rfl:    Zinc 50 MG CAPS, Take 1 capsule by mouth daily., Disp: , Rfl:   I spent 62 minutes dedicated to the care of this patient on the date of this encounter to include pre-visit review of records, face-to-face time with the patient discussing conditions above, post visit ordering of testing, clinical documentation with the electronic health record, making appropriate referrals as documented, and communicating necessary findings to members of the patients care team.   Josephine Igo,  DO [facility] 07/16/2022 2:15 PM

## 2022-07-16 NOTE — H&P (View-Only) (Signed)
Synopsis: Referred in January 2024 for multiple pulmonary nodules by [name], MD  Subjective:   PATIENT ID: Larry Kent GENDER: male DOB: 12/11/1945, MRN: [id number]  Chief Complaint  Patient presents with   Consult    Lung mass    This is a 77 year old gentleman, past medical history of skin cancer, family history of colon cancer and prostate cancer.  Patient was having some constipation issues difficulty going to the restroom and also urination trouble had a urogram CT that showed pulmonary nodules.  He had workup completed with a CT chest as well as pet imaging that revealed innumerable number of pulmonary nodules concerning for widely metastatic disease and hypermetabolic adenopathy within the chest presumed metastatic disease with bony lesions.  Unknown primary.  Patient was referred today to discuss bronchoscopy and biopsy for confirmation of malignancy.    Past Medical History:  Diagnosis Date   Allergy    Arthritis    both knees    BCC (basal cell carcinoma), arm, right 09/2019   MOHS ([place])   BCC (basal cell carcinoma), face 2014   L preauricular s/p MOHS   BCC (basal cell carcinoma), face 02/2018   L upper lip   BPH (benign prostatic hypertrophy)    Dr. Mena Goes @ [facility]   Carotid stenosis 05/2015   RICA 40-59%, LICA 1-39%, L vertebral occlusion, rpt 1 yr   Cataract    removed both eyes    FHx: colon cancer    FHx: prostate cancer    Heart murmur    mild aortic stenosis    Hyperlipidemia    borderline- on rosuvastatin now normal    Nephrolithiasis 09/2012   Occlusion of right vertebral artery 05/2015     Family History  Problem Relation Age of Onset   Cancer Father 22       prostate   Coronary artery disease Father 49       MI   Prostate cancer Father    Cancer Paternal Grandfather 6       prostate   Prostate cancer Paternal Grandfather    Stroke Mother    Hypertension Mother    Cancer Mother 74       colon   Colon cancer Mother     Coronary artery disease Maternal Uncle    Cancer Sister 78       breast   Breast cancer Sister    Colon cancer Maternal Grandmother    Diabetes Neg Hx    Colon polyps Neg Hx    Esophageal cancer Neg Hx    Rectal cancer Neg Hx    Stomach cancer Neg Hx      Past Surgical History:  Procedure Laterality Date   BASAL CELL CARCINOMA EXCISION  05/2018   lip, 2020 x3  basal cells removed    CATARACT EXTRACTION  2008   Left   CATARACT EXTRACTION  2013   Right (Eppes)   COLONOSCOPY  ~2010   medium int hemorrhoids, o/w WNL, rpt 5 yrs given fmhx (Dr. Randa Evens)   COLONOSCOPY  08/2019   TAs, diverticulosis, rpt 3 yrs (Armbruster)   ENDARTERECTOMY Right 12/01/2019   Procedure: RIGHT ENDARTERECTOMY CAROTID;  Surgeon: Cephus Shelling, MD;  Location: [facility] OR;  Service: Vascular;  Laterality: Right;   exercise treadmill  11/2005   WNL Eldridge Dace)   HERNIA REPAIR  2008   Right   MOHS SURGERY Left spring 2014   basal cell face   PATCH ANGIOPLASTY Right 12/01/2019  Procedure: PATCH ANGIOPLASTY USING Livia Snellen BIOLOGIC PATCH;  Surgeon: Cephus Shelling, MD;  Location: [facility] OR;  Service: Vascular;  Laterality: Right;   Testicular Biopsy  2003   benign, varicocele   US ECHOCARDIOGRAPHY  11/2005   aortic sclerosis, EF 55-60%, diastolic dysfunction    Social History   Socioeconomic History   Marital status: Married    Spouse name: Not on file   Number of children: Not on file   Years of education: Not on file   Highest education level: Not on file  Occupational History   Not on file  Tobacco Use   Smoking status: Never   Smokeless tobacco: Never  Vaping Use   Vaping Use: Never used  Substance and Sexual Activity   Alcohol use: Yes    Alcohol/week: 0.0 standard drinks of alcohol    Comment: rare   Drug use: No   Sexual activity: Not on file  Other Topics Concern   Not on file  Social History Narrative   Caffeine: 2-3 cups coffee/day   Lives with wife and adult son, 1 dog    Occupation: Warden/ranger   Edu: college   Activity: golf, no regular exercise, occasionally walks with wife   Diet: good amt water, daily fruits/vegetables, fish several times a week   Social Determinants of Health   Financial Resource Strain: Low Risk  (07/14/2019)   Overall Financial Resource Strain (CARDIA)    Difficulty of Paying Living Expenses: Not hard at all  Food Insecurity: No Food Insecurity (12/05/2019)   Hunger Vital Sign    Worried About Running Out of Food in the Last Year: Never true    Ran Out of Food in the Last Year: Never true  Transportation Needs: No Transportation Needs (12/05/2019)   PRAPARE - Administrator, Civil Service (Medical): No    Lack of Transportation (Non-Medical): No  Physical Activity: Inactive (07/14/2019)   Exercise Vital Sign    Days of Exercise per Week: 0 days    Minutes of Exercise per Session: 0 min  Stress: No Stress Concern Present (07/14/2019)   [employer] of Occupational Health - Occupational Stress Questionnaire    Feeling of Stress : Not at all  Social Connections: Not on file  Intimate Partner Violence: Not At Risk (07/14/2019)   Humiliation, Afraid, Rape, and Kick questionnaire    Fear of Current or Ex-Partner: No    Emotionally Abused: No    Physically Abused: No    Sexually Abused: No     No Known Allergies   Outpatient Medications Prior to Visit  Medication Sig Dispense Refill   fluticasone (FLONASE) 50 MCG/ACT nasal spray Place 1 spray into both nostrils daily as needed.     aspirin EC 81 MG tablet Take 1 tablet (81 mg total) by mouth daily. 90 tablet 3   Multiple Vitamin (MULTIVITAMIN) tablet Take 1 tablet by mouth daily.     rosuvastatin (CRESTOR) 20 MG tablet TAKE 1 TABLET BY MOUTH EVERY DAY 90 tablet 1   tamsulosin (FLOMAX) 0.4 MG CAPS capsule Take 1 capsule (0.4 mg total) by mouth daily.     Zinc 50 MG CAPS Take 1 capsule by mouth daily.     No facility-administered medications prior to  visit.    Review of Systems  Constitutional:  Negative for chills, fever, malaise/fatigue and weight loss.  HENT:  Negative for hearing loss, sore throat and tinnitus.   Eyes:  Negative for blurred vision and double  vision.  Respiratory:  Negative for cough, hemoptysis, sputum production, shortness of breath, wheezing and stridor.   Cardiovascular:  Negative for chest pain, palpitations, orthopnea, leg swelling and PND.  Gastrointestinal:  Negative for abdominal pain, constipation, diarrhea, heartburn, nausea and vomiting.  Genitourinary:  Negative for dysuria, hematuria and urgency.  Musculoskeletal:  Negative for joint pain and myalgias.  Skin:  Negative for itching and rash.  Neurological:  Negative for dizziness, tingling, weakness and headaches.  Endo/Heme/Allergies:  Negative for environmental allergies. Does not bruise/bleed easily.  Psychiatric/Behavioral:  Negative for depression. The patient is not nervous/anxious and does not have insomnia.   All other systems reviewed and are negative.    Objective:  Physical Exam Vitals reviewed.  Constitutional:      General: He is not in acute distress.    Appearance: He is well-developed.  HENT:     Head: Normocephalic and atraumatic.  Eyes:     General: No scleral icterus.    Conjunctiva/sclera: Conjunctivae normal.     Pupils: Pupils are equal, round, and reactive to light.  Neck:     Vascular: No JVD.     Trachea: No tracheal deviation.  Cardiovascular:     Rate and Rhythm: Normal rate and regular rhythm.     Heart sounds: Normal heart sounds. No murmur heard. Pulmonary:     Effort: Pulmonary effort is normal. No tachypnea, accessory muscle usage or respiratory distress.     Breath sounds: No stridor. No wheezing, rhonchi or rales.  Abdominal:     General: There is no distension.     Palpations: Abdomen is soft.     Tenderness: There is no abdominal tenderness.  Musculoskeletal:        General: No tenderness.      Cervical back: Neck supple.  Lymphadenopathy:     Cervical: No cervical adenopathy.  Skin:    General: Skin is warm and dry.     Capillary Refill: Capillary refill takes less than 2 seconds.     Findings: No rash.  Neurological:     Mental Status: He is alert and oriented to person, place, and time.  Psychiatric:        Behavior: Behavior normal.      Vitals:   07/16/22 1337  BP: 110/60  Pulse: 89  SpO2: 96%  Weight: 186 lb (84.4 kg)  Height: 5\' 8"  (1.727 m)   96% on RA BMI Readings from Last 3 Encounters:  07/16/22 28.28 kg/m  09/30/21 30.15 kg/m  09/23/21 36.13 kg/m   Wt Readings from Last 3 Encounters:  07/16/22 186 lb (84.4 kg)  09/30/21 185 lb 6 oz (84.1 kg)  09/23/21 185 lb (83.9 kg)     CBC    Component Value Date/Time   WBC 4.1 09/09/2020 0820   RBC 4.07 (L) 09/09/2020 0820   HGB 14.1 09/09/2020 0820   HCT 41.3 09/09/2020 0820   PLT 203.0 09/09/2020 0820   MCV 101.6 (H) 09/09/2020 0820   MCH 34.6 (H) 12/02/2019 0614   MCHC 34.1 09/09/2020 0820   RDW 13.4 09/09/2020 0820   LYMPHSABS 1.3 09/09/2020 0820   MONOABS 0.5 09/09/2020 0820   EOSABS 0.2 09/09/2020 0820   BASOSABS 0.1 09/09/2020 0820     Chest Imaging:  Nuclear medicine pet imaging super D CT chest imaging: Innumerable number of pulmonary nodules and adenopathy concerning for widely metastatic disease. The patient's images have been independently reviewed by me.    Pulmonary Functions Testing Results:  No data to display          FeNO:   Pathology:   Echocardiogram:   Heart Catheterization:     Assessment & Plan:     ICD-10-CM   1. Lung nodule  R91.1 Ambulatory referral to Pulmonology    Procedural/ Surgical Case Request: VIDEO BRONCHOSCOPY WITH ENDOBRONCHIAL ULTRASOUND, ROBOTIC ASSISTED NAVIGATIONAL BRONCHOSCOPY    2. Adenopathy  R59.9 Ambulatory referral to Pulmonology    Procedural/ Surgical Case Request: VIDEO BRONCHOSCOPY WITH ENDOBRONCHIAL ULTRASOUND,  ROBOTIC ASSISTED NAVIGATIONAL BRONCHOSCOPY      Discussion:  This is a 77 year old gentleman no known primary cancers in the past except for skin cancer.  No family history of lung cancer patient is a lifelong non-smoker.  Recent PET scan with innumerable number of pulmonary nodules and hypermetabolic prostate gland.  Overall is concerning for widely metastatic disease to the chest as well as bony metastasis.  Plan: Today in the office we talked about the risk-benefit alternatives proceeding with bronchoscopy. I believe will contribute to getting answer with videobronchoscopy endobronchial ultrasound transbronchial needle aspiration of subcarinal nodes. Patient is agreeable to this. We talked about the risk of bleeding and pneumothorax. Tentative bronchoscopy date will 07/28/2022. He has already had a PET scan. Will get him set up with medical oncology as soon as we have a tissue diagnosis.    Current Outpatient Medications:    fluticasone (FLONASE) 50 MCG/ACT nasal spray, Place 1 spray into both nostrils daily as needed., Disp: , Rfl:    aspirin EC 81 MG tablet, Take 1 tablet (81 mg total) by mouth daily., Disp: 90 tablet, Rfl: 3   Multiple Vitamin (MULTIVITAMIN) tablet, Take 1 tablet by mouth daily., Disp: , Rfl:    rosuvastatin (CRESTOR) 20 MG tablet, TAKE 1 TABLET BY MOUTH EVERY DAY, Disp: 90 tablet, Rfl: 1   tamsulosin (FLOMAX) 0.4 MG CAPS capsule, Take 1 capsule (0.4 mg total) by mouth daily., Disp: , Rfl:    Zinc 50 MG CAPS, Take 1 capsule by mouth daily., Disp: , Rfl:   I spent 62 minutes dedicated to the care of this patient on the date of this encounter to include pre-visit review of records, face-to-face time with the patient discussing conditions above, post visit ordering of testing, clinical documentation with the electronic health record, making appropriate referrals as documented, and communicating necessary findings to members of the patients care team.   Josephine Igo,  DO [facility] 07/16/2022 2:15 PM

## 2022-07-17 ENCOUNTER — Other Ambulatory Visit: Payer: Self-pay

## 2022-07-17 DIAGNOSIS — R911 Solitary pulmonary nodule: Secondary | ICD-10-CM

## 2022-07-17 NOTE — Progress Notes (Signed)
CBC and CMP ordered for new patient appointment on 1/24

## 2022-07-17 NOTE — Progress Notes (Signed)
I called the pt today to see what day and time he would be available for a consult appointment with Dr.Mohamed. I introduced myself and explained my role as a Statistician. I gave him Dr.Mohameds available appointments, and pt verified that 1/24 at 2:15 would work best. I explained that he would need a lab appointment prior to his appointment, as Dr.Mohamed likes to have baseline labs on all of his new patients. Pt verbalized understanding. Pt is familiar with the location of the cancer center and has a means of transportation to get to the appointment. Pt denied any questions or concerns at this time. I provided my office phone number in the event he needs to contact me prior to his appointment.

## 2022-07-21 ENCOUNTER — Telehealth (HOSPITAL_COMMUNITY): Payer: Self-pay

## 2022-07-22 ENCOUNTER — Ambulatory Visit: Payer: Medicare Other | Admitting: Internal Medicine

## 2022-07-22 ENCOUNTER — Other Ambulatory Visit: Payer: Medicare Other

## 2022-07-22 DIAGNOSIS — N3289 Other specified disorders of bladder: Secondary | ICD-10-CM | POA: Diagnosis not present

## 2022-07-24 ENCOUNTER — Other Ambulatory Visit: Payer: Medicare Other

## 2022-07-24 ENCOUNTER — Other Ambulatory Visit: Payer: Self-pay

## 2022-07-24 ENCOUNTER — Encounter (HOSPITAL_COMMUNITY): Payer: Self-pay | Admitting: Pulmonary Disease

## 2022-07-24 DIAGNOSIS — Z01812 Encounter for preprocedural laboratory examination: Secondary | ICD-10-CM

## 2022-07-24 NOTE — Progress Notes (Signed)
Spoke with pt for pre-op call. Pt states he sees Dr. Fletcher Anon for aortic stenosis. When asked if he has any recent chest pain, he states he has had some off and on in the past 2 days. Denies shortness of breath or nausea. He states it comes and goes and doesn't feel bad. He states he's stressed with this new diagnosis of lung cancer. I did instruct pt that if the chest pain worsens, he gets short of breath, nauseated, pain goes down his arm, becomes diaphoretic - he needs to call 911. Pt voiced understanding.   Pt states he had his Covid test done today.   Shower instructions given to pt and he voiced understanding.

## 2022-07-24 NOTE — Progress Notes (Signed)
Anesthesia Chart Review: Same day workup  Follows with cardiology for hx of HLD, aortic stenosis (mean gradient 12 mmHg by echo 09/2020).  Last seen by Dr. Fletcher Anon on 09/09/2021.  Noted to be doing well at that time from cardiac standpoint, no changes to management, 1 year follow-up recommended.  Follows with vascular surgery for history of carotid stenosis s/p R CEA 11/2019 for severe symptomatic disease.  Last seen by Dr. Carlis Abbott 09/23/2021 and at that time was noted to have a patent right carotid endarterectomy site with no evidence of recurrent stenosis and minimal disease on the left with ICA 1-39% with no progression over the past year.  1 year follow-up recommended.  Patient was recently evaluated for constipation as well as urinary difficulties.  On CT he was incidentally discovered to have innumerable pulmonary nodules concerning for widely metastatic disease and hypermetabolic adenopathy within the chest presumed metastatic disease with bony lesions.  Patient is noted to be a lifelong non-smoker.  He was referred to pulmonology and seen by Dr. Valeta Harms 07/16/2022 who recommended bronchoscopy with aspiration of subcarinal nodes.  Patient will need day of surgery labs and evaluation  EKG 09/09/2021: NSR.  Rate 62.  TTE 10/22/2020:  1. Left ventricular ejection fraction, by estimation, is 55 to 60%. The  left ventricle has normal function. Left ventricular endocardial border  not optimally defined to evaluate regional wall motion. There is moderate  left ventricular hypertrophy. Left  ventricular diastolic parameters are consistent with Grade II diastolic  dysfunction (pseudonormalization). Elevated left atrial pressure.   2. Right ventricular systolic function is normal. The right ventricular  size is mildly enlarged.   3. The mitral valve is abnormal. No evidence of mitral valve  regurgitation. No evidence of mitral stenosis.   4. The aortic valve has an indeterminant number of cusps. There is  mild  calcification of the aortic valve. There is mild thickening of the aortic  valve. Aortic valve regurgitation is trivial. Mild aortic valve stenosis.  Aortic valve mean gradient  measures 12.0 mmHg.   5. There is mild dilatation of the ascending aorta, measuring 37 mm.   6. The inferior vena cava is normal in size with greater than 50%  respiratory variability, suggesting right atrial pressure of 3 mmHg.   Carotid duplex 09/23/2021: Summary:  Right Carotid: There is no evidence of stenosis in the right ICA.  Left Carotid: Velocities in the left ICA are consistent with a 1-39% stenosis.  Vertebrals: Right vertebral artery demonstrates antegrade flow. Left vertebral artery demonstrates high resistant flow.  Subclavians: Normal flow hemodynamics were seen in bilateral subclavian arteries.    Wynonia Musty [facility] Short Stay Center/Anesthesiology Phone [phone] 07/24/2022 1:17 PM

## 2022-07-24 NOTE — Anesthesia Preprocedure Evaluation (Signed)
Anesthesia Evaluation  Patient identified by MRN, date of birth, ID band Patient awake    Reviewed: Allergy & Precautions, NPO status , Patient's Chart, lab work & pertinent test results  History of Anesthesia Complications (+) PONV and history of anesthetic complications  Airway Mallampati: II  TM Distance: >3 FB Neck ROM: Full    Dental  (+) Dental Advisory Given, Upper Dentures   Pulmonary neg shortness of breath lung nodules, adenopathy   Pulmonary exam normal breath sounds clear to auscultation       Cardiovascular (-) angina + Peripheral Vascular Disease  + Valvular Problems/Murmurs AS  Rhythm:Regular Rate:Normal + Systolic murmurs    Neuro/Psych  Neuromuscular disease  negative psych ROS   GI/Hepatic negative GI ROS, Neg liver ROS,,,  Endo/Other  negative endocrine ROS    Renal/GU negative Renal ROS     Musculoskeletal  (+) Arthritis ,    Abdominal   Peds  Hematology negative hematology ROS (+)   Anesthesia Other Findings Day of surgery medications reviewed with the patient.  Reproductive/Obstetrics                             Anesthesia Physical Anesthesia Plan  ASA: 4  Anesthesia Plan: General   Post-op Pain Management: Tylenol PO (pre-op)*   Induction: Intravenous  PONV Risk Score and Plan: 3 and TIVA, Dexamethasone and Ondansetron  Airway Management Planned: Oral ETT  Additional Equipment:   Intra-op Plan:   Post-operative Plan: Extubation in OR  Informed Consent: I have reviewed the patients History and Physical, chart, labs and discussed the procedure including the risks, benefits and alternatives for the proposed anesthesia with the patient or authorized representative who has indicated his/her understanding and acceptance.     Dental advisory given  Plan Discussed with: CRNA  Anesthesia Plan Comments: (PAT note by Karoline Caldwell, PA-C: Follows with  vascular surgery for history of carotid stenosis s/p R CEA 11/2019 for severe symptomatic disease.  Last seen by Dr. Carlis Abbott 09/23/2021 and at that time was noted to have a patent right carotid endarterectomy site with no evidence of recurrent stenosis and minimal disease on the left with ICA 1-39% with no progression over the past year.  1 year follow-up recommended.  Patient was recently evaluated for constipation as well as urinary difficulties.  On CT he was incidentally discovered to have innumerable pulmonary nodules concerning for widely metastatic disease and hypermetabolic adenopathy within the chest presumed metastatic disease with bony lesions.  Patient is noted to be a lifelong non-smoker.  He was referred to pulmonology and seen by Dr. Valeta Harms 07/16/2022 who recommended bronchoscopy with aspiration of subcarinal nodes.  Patient will need day of surgery labs and evaluation  EKG 09/09/2021: NSR.  Rate 62.  TTE 10/22/2020: 1. Left ventricular ejection fraction, by estimation, is 55 to 60%. The  left ventricle has normal function. Left ventricular endocardial border  not optimally defined to evaluate regional wall motion. There is moderate  left ventricular hypertrophy. Left  ventricular diastolic parameters are consistent with Grade II diastolic  dysfunction (pseudonormalization). Elevated left atrial pressure.  2. Right ventricular systolic function is normal. The right ventricular  size is mildly enlarged.  3. The mitral valve is abnormal. No evidence of mitral valve  regurgitation. No evidence of mitral stenosis.  4. The aortic valve has an indeterminant number of cusps. There is mild  calcification of the aortic valve. There is mild thickening of the aortic  valve. Aortic valve regurgitation is trivial. Mild aortic valve stenosis.  Aortic valve mean gradient  measures 12.0 mmHg.  5. There is mild dilatation of the ascending aorta, measuring 37 mm.  6. The inferior vena cava is  normal in size with greater than 50%  respiratory variability, suggesting right atrial pressure of 3 mmHg.   Carotid duplex 09/23/2021: Summary:  Right Carotid: There is no evidence of stenosis in the right ICA.  Left Carotid: Velocities in the left ICA are consistent with a 1-39% stenosis.  Vertebrals:Right vertebral artery demonstrates antegrade flow. Left vertebral artery demonstrates high resistant flow.  Subclavians: Normal flow hemodynamics were seen in bilateral subclavian arteries.    )        Anesthesia Quick Evaluation

## 2022-07-26 LAB — NOVEL CORONAVIRUS, NAA: SARS-CoV-2, NAA: NOT DETECTED

## 2022-07-28 ENCOUNTER — Ambulatory Visit (HOSPITAL_BASED_OUTPATIENT_CLINIC_OR_DEPARTMENT_OTHER): Payer: Medicare Other | Admitting: Physician Assistant

## 2022-07-28 ENCOUNTER — Encounter (HOSPITAL_COMMUNITY): Payer: Self-pay | Admitting: Pulmonary Disease

## 2022-07-28 ENCOUNTER — Ambulatory Visit (HOSPITAL_COMMUNITY): Payer: Medicare Other | Admitting: Physician Assistant

## 2022-07-28 ENCOUNTER — Encounter (HOSPITAL_COMMUNITY)
Admission: RE | Disposition: A | Discharge status: Home / Self Care | Payer: Self-pay | Source: Ambulatory Visit | Attending: Pulmonary Disease

## 2022-07-28 ENCOUNTER — Ambulatory Visit (HOSPITAL_COMMUNITY)
Admission: RE | Admit: 2022-07-28 | Discharge: 2022-07-28 | Disposition: A | Discharge status: Home / Self Care | Payer: Medicare Other | Source: Ambulatory Visit | Attending: Pulmonary Disease | Admitting: Pulmonary Disease

## 2022-07-28 DIAGNOSIS — R590 Localized enlarged lymph nodes: Secondary | ICD-10-CM

## 2022-07-28 DIAGNOSIS — I350 Nonrheumatic aortic (valve) stenosis: Secondary | ICD-10-CM | POA: Diagnosis not present

## 2022-07-28 DIAGNOSIS — C801 Malignant (primary) neoplasm, unspecified: Secondary | ICD-10-CM | POA: Diagnosis not present

## 2022-07-28 DIAGNOSIS — C771 Secondary and unspecified malignant neoplasm of intrathoracic lymph nodes: Secondary | ICD-10-CM | POA: Diagnosis not present

## 2022-07-28 DIAGNOSIS — R911 Solitary pulmonary nodule: Secondary | ICD-10-CM | POA: Insufficient documentation

## 2022-07-28 DIAGNOSIS — R599 Enlarged lymph nodes, unspecified: Secondary | ICD-10-CM | POA: Diagnosis not present

## 2022-07-28 DIAGNOSIS — I739 Peripheral vascular disease, unspecified: Secondary | ICD-10-CM | POA: Insufficient documentation

## 2022-07-28 DIAGNOSIS — M1990 Unspecified osteoarthritis, unspecified site: Secondary | ICD-10-CM

## 2022-07-28 DIAGNOSIS — R918 Other nonspecific abnormal finding of lung field: Secondary | ICD-10-CM

## 2022-07-28 DIAGNOSIS — Z85828 Personal history of other malignant neoplasm of skin: Secondary | ICD-10-CM | POA: Insufficient documentation

## 2022-07-28 HISTORY — PX: FINE NEEDLE ASPIRATION: SHX5430

## 2022-07-28 HISTORY — PX: VIDEO BRONCHOSCOPY WITH ENDOBRONCHIAL ULTRASOUND: SHX6177

## 2022-07-28 HISTORY — DX: Personal history of urinary calculi: Z87.442

## 2022-07-28 LAB — POCT I-STAT, CHEM 8
BUN: 18 mg/dL (ref 8–23)
Calcium, Ion: 1.19 mmol/L (ref 1.15–1.40)
Chloride: 103 mmol/L (ref 98–111)
Creatinine, Ser: 0.9 mg/dL (ref 0.61–1.24)
Glucose, Bld: 109 mg/dL — ABNORMAL HIGH (ref 70–99)
HCT: 40 % (ref 39.0–52.0)
Hemoglobin: 13.6 g/dL (ref 13.0–17.0)
Potassium: 4.2 mmol/L (ref 3.5–5.1)
Sodium: 139 mmol/L (ref 135–145)
TCO2: 25 mmol/L (ref 22–32)

## 2022-07-28 SURGERY — BRONCHOSCOPY, WITH EBUS
Anesthesia: General | Laterality: Bilateral

## 2022-07-28 MED ORDER — ONDANSETRON HCL 4 MG/2ML IJ SOLN
INTRAMUSCULAR | Status: DC | PRN
  Administered 2022-07-28: 4 mg via INTRAVENOUS

## 2022-07-28 MED ORDER — FENTANYL CITRATE (PF) 100 MCG/2ML IJ SOLN
INTRAMUSCULAR | Status: AC
  Filled 2022-07-28: qty 2

## 2022-07-28 MED ORDER — PROPOFOL 10 MG/ML IV BOLUS
INTRAVENOUS | Status: DC | PRN
  Administered 2022-07-28: 150 mg via INTRAVENOUS

## 2022-07-28 MED ORDER — LIDOCAINE 2% (20 MG/ML) 5 ML SYRINGE
INTRAMUSCULAR | Status: DC | PRN
  Administered 2022-07-28: 50 mg via INTRAVENOUS

## 2022-07-28 MED ORDER — PROPOFOL 500 MG/50ML IV EMUL
INTRAVENOUS | Status: DC | PRN
  Administered 2022-07-28: 125 ug/kg/min via INTRAVENOUS

## 2022-07-28 MED ORDER — ROCURONIUM BROMIDE 10 MG/ML (PF) SYRINGE
PREFILLED_SYRINGE | INTRAVENOUS | Status: DC | PRN
  Administered 2022-07-28: 50 mg via INTRAVENOUS

## 2022-07-28 MED ORDER — EPHEDRINE SULFATE-NACL 50-0.9 MG/10ML-% IV SOSY
PREFILLED_SYRINGE | INTRAVENOUS | Status: DC | PRN
  Administered 2022-07-28: 5 mg via INTRAVENOUS

## 2022-07-28 MED ORDER — FENTANYL CITRATE (PF) 100 MCG/2ML IJ SOLN
INTRAMUSCULAR | Status: DC | PRN
  Administered 2022-07-28 (×2): 50 ug via INTRAVENOUS

## 2022-07-28 MED ORDER — LACTATED RINGERS IV SOLN: INTRAVENOUS | Status: DC

## 2022-07-28 MED ORDER — DEXAMETHASONE SODIUM PHOSPHATE 10 MG/ML IJ SOLN
INTRAMUSCULAR | Status: DC | PRN
  Administered 2022-07-28: 10 mg via INTRAVENOUS

## 2022-07-28 MED ORDER — CHLORHEXIDINE GLUCONATE 0.12 % MT SOLN
15.0000 mL | OROMUCOSAL | Status: AC
  Filled 2022-07-28: qty 15

## 2022-07-28 MED ORDER — PHENYLEPHRINE HCL-NACL 20-0.9 MG/250ML-% IV SOLN
INTRAVENOUS | Status: DC | PRN
  Administered 2022-07-28: 25 ug/min via INTRAVENOUS

## 2022-07-28 MED ORDER — CHLORHEXIDINE GLUCONATE 0.12 % MT SOLN
OROMUCOSAL | Status: AC
  Administered 2022-07-28: 15 mL via OROMUCOSAL
  Filled 2022-07-28: qty 15

## 2022-07-28 MED ORDER — ACETAMINOPHEN 500 MG PO TABS
1000.0000 mg | ORAL_TABLET | Freq: Once | ORAL | Status: AC
  Administered 2022-07-28: 1000 mg via ORAL
  Filled 2022-07-28: qty 2

## 2022-07-28 SURGICAL SUPPLY — 29 items

## 2022-07-28 NOTE — Interval H&P Note (Signed)
History and Physical Interval Note:  07/28/2022 12:19 PM  Larry Kent  has presented today for surgery, with the diagnosis of lung nodules, adenopathy.  The various methods of treatment have been discussed with the patient and family. After consideration of risks, benefits and other options for treatment, the patient has consented to  Procedure(s) with comments: ROBOTIC ASSISTED NAVIGATIONAL BRONCHOSCOPY (Bilateral) - just have equipment available. I dont think we will need the robotic system. VIDEO BRONCHOSCOPY WITH ENDOBRONCHIAL ULTRASOUND (Bilateral) as a surgical intervention.  The patient's history has been reviewed, patient examined, no change in status, stable for surgery.  I have reviewed the patient's chart and labs.  Questions were answered to the patient's satisfaction.     H[place]

## 2022-07-28 NOTE — Transfer of Care (Signed)
Immediate Anesthesia Transfer of Care Note  Patient: Larry Kent  Procedure(s) Performed: VIDEO BRONCHOSCOPY WITH ENDOBRONCHIAL ULTRASOUND (Bilateral) FINE NEEDLE ASPIRATION (FNA) LINEAR  Patient Location: PACU  Anesthesia Type:General  Level of Consciousness: awake, alert , and oriented  Airway & Oxygen Therapy: Patient Spontanous Breathing and Patient connected to nasal cannula oxygen  Post-op Assessment: Report given to RN, Post -op Vital signs reviewed and stable, and Patient moving all extremities X 4  Post vital signs: Reviewed and stable  Last Vitals:  Vitals Value Taken Time  BP 110/75 07/28/22 1545  Temp 36.3 C 07/28/22 1541  Pulse 68 07/28/22 1551  Resp 17 07/28/22 1551  SpO2 98 % 07/28/22 1551  Vitals shown include unvalidated device data.  Last Pain:  Vitals:   07/28/22 1541  TempSrc:   PainSc: 0-No pain      Patients Stated Pain Goal: 0 (65/68/12 7517)  Complications: No notable events documented.

## 2022-07-28 NOTE — Discharge Instructions (Signed)

## 2022-07-28 NOTE — Anesthesia Procedure Notes (Signed)
Procedure Name: Intubation Date/Time: 07/28/2022 3:02 PM  Performed by: Mariea Clonts, CRNAPre-anesthesia Checklist: Patient identified, Emergency Drugs available, Suction available and Patient being monitored Patient Re-evaluated:Patient Re-evaluated prior to induction Oxygen Delivery Method: Circle System Utilized Preoxygenation: Pre-oxygenation with 100% oxygen Induction Type: IV induction Ventilation: Mask ventilation without difficulty Laryngoscope Size: Mac and 4 Grade View: Grade II Tube type: Oral Tube size: 8.5 mm Number of attempts: 1 Airway Equipment and Method: Stylet and Oral airway Placement Confirmation: ETT inserted through vocal cords under direct vision, positive ETCO2 and breath sounds checked- equal and bilateral Tube secured with: Tape Dental Injury: Teeth and Oropharynx as per pre-operative assessment

## 2022-07-28 NOTE — Op Note (Signed)
Video Bronchoscopy with Endobronchial Ultrasound Procedure Note  Date of Operation: 07/28/2022  Pre-op Diagnosis: Adenopathy   Post-op Diagnosis: Adenopathy   Surgeon: Garner Nash, DO   Assistants: None   Anesthesia: General endotracheal anesthesia  Operation: Flexible video fiberoptic bronchoscopy with endobronchial ultrasound and biopsies.  Estimated Blood Loss: Minimal  Complications: None   Indications and History: Larry Kent is a 77 y.o. male with multiple pulmonary nodules and adenopathy.  The risks, benefits, complications, treatment options and expected outcomes were discussed with the patient.  The possibilities of pneumothorax, pneumonia, reaction to medication, pulmonary aspiration, perforation of a viscus, bleeding, failure to diagnose a condition and creating a complication requiring transfusion or operation were discussed with the patient who freely signed the consent.    Description of Procedure: The patient was examined in the preoperative area and history and data from the preprocedure consultation were reviewed. It was deemed appropriate to proceed.  The patient was taken to [facility] endo 3, identified as Larry Kent and the procedure verified as Flexible Video Fiberoptic Bronchoscopy.  A Time Out was held and the above information confirmed. After being taken to the operating room general anesthesia was initiated and the patient  was orally intubated. The video fiberoptic bronchoscope was introduced via the endotracheal tube and a general inspection was performed which showed normal right and left lung anatomy. The standard scope was then withdrawn and the endobronchial ultrasound was used to identify and characterize the peritracheal, hilar and bronchial lymph nodes. Inspection showed enlarged subcarinal node . Using real-time ultrasound guidance Wang needle biopsies were take from Station 7 nodes and were sent for cytology. The patient tolerated the procedure well  without apparent complications. There was no significant blood loss. The bronchoscope was withdrawn. Anesthesia was reversed and the patient was taken to the PACU for recovery.   Samples: 1. Wang needle biopsies from Station 7 node  Plans:  The patient will be discharged from the PACU to home when recovered from anesthesia. We will review the cytology, pathology results with the patient when they become available. Outpatient followup will be with Dr. Julien Nordmann and Dr.Margeaux Swantek.   Garner Nash, DO [facility] 07/28/2022 3:42 PM

## 2022-07-29 ENCOUNTER — Encounter (HOSPITAL_COMMUNITY): Payer: Self-pay | Admitting: Pulmonary Disease

## 2022-07-29 NOTE — Anesthesia Postprocedure Evaluation (Signed)
Anesthesia Post Note  Patient: Larry Kent  Procedure(s) Performed: VIDEO BRONCHOSCOPY WITH ENDOBRONCHIAL ULTRASOUND (Bilateral) FINE NEEDLE ASPIRATION (FNA) LINEAR     Patient location during evaluation: PACU Anesthesia Type: General Level of consciousness: awake and alert Pain management: pain level controlled Vital Signs Assessment: post-procedure vital signs reviewed and stable Respiratory status: spontaneous breathing, nonlabored ventilation, respiratory function stable and patient connected to nasal cannula oxygen Cardiovascular status: blood pressure returned to baseline and stable Postop Assessment: no apparent nausea or vomiting Anesthetic complications: no   No notable events documented.  Last Vitals:  Vitals:   07/28/22 1615 07/28/22 1624  BP: 136/70 (!) 145/79  Pulse: (!) 59 73  Resp: 14 15  Temp:  36.7 C  SpO2: 95% 97%    Last Pain:  Vitals:   07/28/22 1624  TempSrc:   PainSc: 0-No pain                 Nigil Braman S

## 2022-07-30 DIAGNOSIS — C3490 Malignant neoplasm of unspecified part of unspecified bronchus or lung: Secondary | ICD-10-CM

## 2022-07-30 DIAGNOSIS — C61 Malignant neoplasm of prostate: Secondary | ICD-10-CM

## 2022-07-30 HISTORY — DX: Malignant neoplasm of unspecified part of unspecified bronchus or lung: C34.90

## 2022-07-30 HISTORY — DX: Malignant neoplasm of prostate: C61

## 2022-07-31 LAB — CYTOLOGY - NON PAP

## 2022-08-03 ENCOUNTER — Inpatient Hospital Stay (HOSPITAL_BASED_OUTPATIENT_CLINIC_OR_DEPARTMENT_OTHER): Payer: Medicare Other | Admitting: Internal Medicine

## 2022-08-03 ENCOUNTER — Inpatient Hospital Stay: Payer: Medicare Other | Attending: Nurse Practitioner

## 2022-08-03 ENCOUNTER — Encounter: Payer: Self-pay | Admitting: *Deleted

## 2022-08-03 VITALS — BP 125/70 | HR 81 | Temp 98.4°F | Resp 16 | Wt 180.8 lb

## 2022-08-03 DIAGNOSIS — C7A1 Malignant poorly differentiated neuroendocrine tumors: Secondary | ICD-10-CM | POA: Diagnosis not present

## 2022-08-03 DIAGNOSIS — R590 Localized enlarged lymph nodes: Secondary | ICD-10-CM | POA: Insufficient documentation

## 2022-08-03 DIAGNOSIS — Z79899 Other long term (current) drug therapy: Secondary | ICD-10-CM | POA: Insufficient documentation

## 2022-08-03 DIAGNOSIS — C61 Malignant neoplasm of prostate: Secondary | ICD-10-CM | POA: Diagnosis present

## 2022-08-03 DIAGNOSIS — C3490 Malignant neoplasm of unspecified part of unspecified bronchus or lung: Secondary | ICD-10-CM | POA: Diagnosis not present

## 2022-08-03 DIAGNOSIS — C7951 Secondary malignant neoplasm of bone: Secondary | ICD-10-CM | POA: Diagnosis not present

## 2022-08-03 DIAGNOSIS — Z5111 Encounter for antineoplastic chemotherapy: Secondary | ICD-10-CM | POA: Insufficient documentation

## 2022-08-03 DIAGNOSIS — R918 Other nonspecific abnormal finding of lung field: Secondary | ICD-10-CM | POA: Insufficient documentation

## 2022-08-03 DIAGNOSIS — R911 Solitary pulmonary nodule: Secondary | ICD-10-CM

## 2022-08-03 LAB — CBC WITH DIFFERENTIAL (CANCER CENTER ONLY)
Abs Immature Granulocytes: 0.01 10*3/uL (ref 0.00–0.07)
Basophils Absolute: 0 10*3/uL (ref 0.0–0.1)
Basophils Relative: 1 %
Eosinophils Absolute: 0.1 10*3/uL (ref 0.0–0.5)
Eosinophils Relative: 2 %
HCT: 36.8 % — ABNORMAL LOW (ref 39.0–52.0)
Hemoglobin: 12.7 g/dL — ABNORMAL LOW (ref 13.0–17.0)
Immature Granulocytes: 0 %
Lymphocytes Relative: 15 %
Lymphs Abs: 0.9 10*3/uL (ref 0.7–4.0)
MCH: 34.9 pg — ABNORMAL HIGH (ref 26.0–34.0)
MCHC: 34.5 g/dL (ref 30.0–36.0)
MCV: 101.1 fL — ABNORMAL HIGH (ref 80.0–100.0)
Monocytes Absolute: 0.5 10*3/uL (ref 0.1–1.0)
Monocytes Relative: 8 %
Neutro Abs: 4.5 10*3/uL (ref 1.7–7.7)
Neutrophils Relative %: 74 %
Platelet Count: 191 10*3/uL (ref 150–400)
RBC: 3.64 MIL/uL — ABNORMAL LOW (ref 4.22–5.81)
RDW: 13 % (ref 11.5–15.5)
WBC Count: 5.9 10*3/uL (ref 4.0–10.5)
nRBC: 0 % (ref 0.0–0.2)

## 2022-08-03 LAB — CMP (CANCER CENTER ONLY)
ALT: 23 U/L (ref 0–44)
AST: 27 U/L (ref 15–41)
Albumin: 3.8 g/dL (ref 3.5–5.0)
Alkaline Phosphatase: 109 U/L (ref 38–126)
Anion gap: 5 (ref 5–15)
BUN: 25 mg/dL — ABNORMAL HIGH (ref 8–23)
CO2: 27 mmol/L (ref 22–32)
Calcium: 9.1 mg/dL (ref 8.9–10.3)
Chloride: 105 mmol/L (ref 98–111)
Creatinine: 0.99 mg/dL (ref 0.61–1.24)
GFR, Estimated: >60 mL/min (ref >60)
Glucose, Bld: 162 mg/dL — ABNORMAL HIGH (ref 70–99)
Potassium: 4.4 mmol/L (ref 3.5–5.1)
Sodium: 137 mmol/L (ref 135–145)
Total Bilirubin: 0.5 mg/dL (ref 0.3–1.2)
Total Protein: 6.4 g/dL — ABNORMAL LOW (ref 6.5–8.1)

## 2022-08-03 MED ORDER — PROCHLORPERAZINE MALEATE 10 MG PO TABS: 10.0000 mg | ORAL_TABLET | Freq: Four times a day (QID) | ORAL | 0 refills | Status: DC | PRN

## 2022-08-03 MED ORDER — LIDOCAINE-PRILOCAINE 2.5-2.5 % EX CREA: TOPICAL_CREAM | CUTANEOUS | 0 refills | Status: DC

## 2022-08-03 NOTE — Progress Notes (Signed)
Today was the pt's initial MedOnc consult. Pt was accompanied by his wife, Larry Kent, and daughter, Larry Kent. Pt was informed today that he has Stage IV disease that is present in his lungs,bone, and potentially his prostate as well. The patient has a prostate biopsy on 2/14 and Dr Julien Nordmann encouraged the pt to keep that appointment.  The plan for the pt is start chemo on Monday 2/12. He will need an MRI which I will schedule for him. Dr.Mohamed discussed the possibility of the patient getting a port, so I explained its benefits and how it's placed. He understands he will not have it prior to his first treatment and a peripheral IV will be used. Dr.Mohamed will place a referral for radiation oncology for the areas of metastatic dz in his hips.  The pts wife was overwhelmed as to be expected, stating that "he's always been healthy".  The pt has a son who lives locally and his daughter lives near [place]. I answered their questions to satisfaction, however I provided a card to [place] with my direct number and encouraged them to call me if either of them have any questions.

## 2022-08-03 NOTE — Progress Notes (Signed)
[name] [phone]   Fax:([phone]) [phone]  CONSULT NOTE  REFERRING PHYSICIAN: Dr. Leory Plowman Kent  REASON FOR CONSULTATION:  77 years old white male recently diagnosed with lung cancer.  HPI Larry Kent is a 77 y.o. male with past medical history significant for benign prostatic hypertrophy, allergy, osteoarthritis, basal cell carcinoma of the skin, dyslipidemia, kidney stones, aortic stenosis as well as prediabetes.  The patient is a never smoker.  He mentioned that in November 2021 he has been complaining of increased urinary frequency.  He was seen by his urologist but at that time he was complaining also of flulike symptoms.  He has constipation.  He has no improvement in his condition and he was seen by urology again and they decided to proceed with CT urogram on July 10, 2022 and that showed 3 mm right renal stone with mildly enlarged prostate but there was numerous solid pulmonary nodules bilaterally.  The patient had a PET scan on July 15, 2022 and that showed innumerable tracer avid pulmonary nodules scattered throughout both lungs compatible with metastatic disease.  There was FDG avid right hilar, subcarinal and right external iliac lymph nodes compatible with nodal metastasis in addition to multifocal tracer avid bone metastasis including mildly sclerotic lesion within the left anterior pubic rami, mildly sclerotic lesion within the right lower sacrum as well as tracer avid lesion involving the right posterior T12 vertebral body.  The scan also showed enlarged prostate gland with asymmetric increased uptake along the posterior and left side of the gland and underlying prostate gland malignancy cannot be excluded.  On July 28, 2022 the patient underwent video bronchoscopy with endobronchial ultrasound procedure under the care of Dr. Valeta Kent.  The final pathology ([id number]) showed poorly differentiated carcinoma. The tumor cells are positive for  TTF-1, synaptophysin and prostein.  They are negative for CK20, CD56, chromogranin, Napsin A, CK7, p40, Melan-A, S100, PSA and CDX2.  TTF-1 positivity is typical of primary lung carcinoma and synaptophysin  supports neuroendocrine differentiation.  However, the prostein  positivity raises the possibility of metastatic prostate carcinoma.  TTF-1 can be positive in prostate carcinoma with neuroendocrine  differentiation.  The patient was referred to me today for evaluation and recommendation regarding treatment of his condition. When seen today he continues to complain of fatigue and Kent of energy as well as increased urinary frequency and he is followed by Dr. Junious Kent and expected to have prostate biopsy on 08/12/2022.  He also has shortness of breath with exertion with occasional dizzy spells and cough mainly clearing his throat.  He denied having any nausea, vomiting, diarrhea but has intermittent constipation.  He has no headache or visual changes. Family history significant for father with prostate cancer and heart disease.  Mother had colon cancer and stroke.  Sister had breast cancer.  Paternal grandfather had prostate cancer and paternal grandmother had colon cancer. The patient is married and has 2 children.  He was accompanied today by his wife Larry Kent and his daughter Larry Kent.  He also has a son named Larry Kent.  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  He has no history for smoking and drinks alcohol occasionally with no history of drug abuse. HPI  Past Medical History:  Diagnosis Date   Allergy    Arthritis    both knees    BCC (basal cell carcinoma), arm, right 09/2019   MOHS ([place])   BCC (basal cell carcinoma), face 2014  L preauricular s/p MOHS   BCC (basal cell carcinoma), face 02/2018   L upper lip   BPH (benign prostatic hypertrophy)    Dr. Junious Kent @ [facility]   Carotid stenosis 51/7001   RICA 74-94%, LICA 4-96%, L vertebral occlusion, rpt 1 yr    Cataract    removed both eyes    FHx: colon cancer    FHx: prostate cancer    Heart murmur    mild aortic stenosis    History of kidney stones    Hyperlipidemia    borderline- on rosuvastatin now normal    Occlusion of right vertebral artery 05/2015    Past Surgical History:  Procedure Laterality Date   BASAL CELL CARCINOMA EXCISION  05/2018   lip, 2020 x3  basal cells removed    CATARACT EXTRACTION  2008   Left   CATARACT EXTRACTION  2013   Right ([facility])   COLONOSCOPY  ~2010   medium int hemorrhoids, o/w WNL, rpt 5 yrs given fmhx (Dr. Oletta Kent)   COLONOSCOPY  08/2019   TAs, diverticulosis, rpt 3 yrs (Larry Kent)   ENDARTERECTOMY Right 12/01/2019   Procedure: RIGHT ENDARTERECTOMY CAROTID;  Surgeon: Larry Heck, MD;  Location: [facility];  Service: Vascular;  Laterality: Right;   exercise treadmill  11/2005   WNL Larry Kent)   FINE NEEDLE ASPIRATION  07/28/2022   Procedure: FINE NEEDLE ASPIRATION (FNA) LINEAR;  Surgeon: Larry Nash, DO;  Location: [facility];  Service: Pulmonary;;   HERNIA REPAIR  2008   Right   MOHS SURGERY Left spring 2014   basal cell face   PATCH ANGIOPLASTY Right 12/01/2019   Procedure: [place];  Surgeon: Larry Heck, MD;  Location: [facility];  Service: Vascular;  Laterality: Right;   Testicular Biopsy  2003   benign, varicocele   US ECHOCARDIOGRAPHY  11/2005   aortic sclerosis, EF 75-91%, diastolic dysfunction   VIDEO BRONCHOSCOPY WITH ENDOBRONCHIAL ULTRASOUND Bilateral 07/28/2022   Procedure: VIDEO BRONCHOSCOPY WITH ENDOBRONCHIAL ULTRASOUND;  Surgeon: Larry Nash, DO;  Location: [facility];  Service: Pulmonary;  Laterality: Bilateral;    Family History  Problem Relation Age of Onset   Cancer Father 46       prostate   Coronary artery disease Father 49       MI   Prostate cancer Father    Cancer Paternal Grandfather 50       prostate   Prostate cancer Paternal Grandfather    Stroke Mother     Hypertension Mother    Cancer Mother 85       colon   Colon cancer Mother    Coronary artery disease Maternal Uncle    Cancer Sister 46       breast   Breast cancer Sister    Colon cancer Maternal Grandmother    Diabetes Neg Hx    Colon polyps Neg Hx    Esophageal cancer Neg Hx    Rectal cancer Neg Hx    Stomach cancer Neg Hx     Social History Social History   Tobacco Use   Smoking status: Never   Smokeless tobacco: Never  Vaping Use   Vaping Use: Never used  Substance Use Topics   Alcohol use: Yes    Alcohol/week: 0.0 standard drinks of alcohol    Comment: rare   Drug use: No    No Known Allergies  Current Outpatient Medications  Medication Sig Dispense Refill   acetaminophen (TYLENOL) 500 MG tablet  Take 500 mg by mouth every 6 (six) hours as needed for moderate pain.     aspirin EC 81 MG tablet Take 1 tablet (81 mg total) by mouth daily. 90 tablet 3   Carboxymethylcellul-Glycerin (LUBRICATING EYE DROPS OP) Place 1 drop into both eyes daily as needed (dry eyes).     clobetasol cream (TEMOVATE) 5.36 % Apply 1 Application topically daily.     finasteride (PROSCAR) 5 MG tablet Take 5 mg by mouth daily.     fluticasone (FLONASE) 50 MCG/ACT nasal spray Place 1 spray into both nostrils daily as needed.     ibuprofen (ADVIL) 200 MG tablet Take 400 mg by mouth every 6 (six) hours as needed for moderate pain.     Multiple Vitamin (MULTIVITAMIN) tablet Take 1 tablet by mouth daily.     rosuvastatin (CRESTOR) 20 MG tablet TAKE 1 TABLET BY MOUTH EVERY DAY 90 tablet 1   tamsulosin (FLOMAX) 0.4 MG CAPS capsule Take 0.4 mg by mouth 2 (two) times daily.     Zinc 25 MG TABS Take 25 mg by mouth daily.     No current facility-administered medications for this visit.    Review of Systems  Constitutional: positive for fatigue Eyes: negative Ears, nose, mouth, throat, and face: negative Respiratory: positive for cough and dyspnea on exertion Cardiovascular:  negative Gastrointestinal: positive for constipation Genitourinary:positive for frequency Integument/breast: negative Hematologic/lymphatic: negative Musculoskeletal:negative Neurological: negative Behavioral/Psych: negative Endocrine: negative Allergic/Immunologic: negative  Physical Exam  RWE:RXVQM, healthy, no distress, well nourished, and well developed SKIN: skin color, texture, turgor are normal, no rashes or significant lesions HEAD: Normocephalic, No masses, lesions, tenderness or abnormalities EYES: normal, PERRLA, Conjunctiva are pink and non-injected EARS: External ears normal, Canals clear OROPHARYNX:no exudate, no erythema, and lips, buccal mucosa, and tongue normal  NECK: supple, no adenopathy, no JVD LYMPH:  no palpable lymphadenopathy, no hepatosplenomegaly LUNGS: clear to auscultation , and palpation HEART: regular rate & rhythm, no murmurs, and no gallops ABDOMEN:abdomen soft, non-tender, normal bowel sounds, and no masses or organomegaly BACK: Back symmetric, no curvature., No CVA tenderness EXTREMITIES:no joint deformities, effusion, or inflammation, no edema  NEURO: alert & oriented x 3 with fluent speech, no focal motor/sensory deficits  PERFORMANCE STATUS: ECOG 1  LABORATORY DATA: Lab Results  Component Value Date   WBC 5.9 08/03/2022   HGB 12.7 (L) 08/03/2022   HCT 36.8 (L) 08/03/2022   MCV 101.1 (H) 08/03/2022   PLT 191 08/03/2022      Chemistry      Component Value Date/Time   NA 137 08/03/2022 1352   K 4.4 08/03/2022 1352   CL 105 08/03/2022 1352   CO2 27 08/03/2022 1352   BUN 25 (H) 08/03/2022 1352   CREATININE 0.99 08/03/2022 1352      Component Value Date/Time   CALCIUM 9.1 08/03/2022 1352   ALKPHOS 109 08/03/2022 1352   AST 27 08/03/2022 1352   ALT 23 08/03/2022 1352   BILITOT 0.5 08/03/2022 1352       RADIOGRAPHIC STUDIES: NM PET SUPER D CT  Result Date: 07/15/2022 CLINICAL DATA:  Multiple pulmonary nodules. * Tracking  Code: BO * EXAM: CT CHEST WITHOUT CONTRAST TECHNIQUE: Multidetector CT imaging of the chest was performed using thin slice collimation for electromagnetic bronchoscopy planning purposes, without intravenous contrast. RADIATION DOSE REDUCTION: This exam was performed according to the departmental dose-optimization program which includes automated exposure control, adjustment of the mA and/or kV according to patient size and/or use of iterative reconstruction technique.  COMPARISON:  PET-CT performed earlier today FINDINGS: Cardiovascular: Heart size is normal. No pericardial effusion. Aortic atherosclerosis and 3 vessel coronary artery calcifications. Mediastinum/Nodes: Thyroid gland, trachea and esophagus are unremarkable. No enlarged axillary lymph nodes. Subcarinal lymph node is enlarged measuring 1.8 cm and exhibits increased FDG uptake on PET scan from earlier today, image 28/2 the hilar lymph nodes are suboptimally evaluated due to Kent of IV contrast. Lungs/Pleura: Trace left pleural effusion. No airspace disease. Innumerable pulmonary nodules are scattered throughout both lungs. Index lesions include: -dominant nodule is in the right middle lobe measuring 2.4 cm, image 88/4. -posterior left upper lobe lung nodule measures 1.4 cm, image 59/4. -central right upper lobe lung nodule measures 1.1 cm, image 39/4. -nodule at the anterior left lung base measures 2 cm, image 112/4. -posteromedial right lower lobe lung nodule measures 1.1 cm, image 106/4. Upper Abdomen: No acute abnormality. Musculoskeletal: Lucent lesion within the posterior right second rib adjacent to the costovertebral junction measures 1 cm. Increased tracer uptake is identified within this area on the PET-CT from earlier today, image 5/2. IMPRESSION: 1. Innumerable pulmonary nodules are scattered throughout both lungs compatible with metastatic disease. The dominant nodule is in the right middle lobe measuring 2.4 cm. 2. Enlarged subcarinal lymph  node exhibits increased FDG uptake on PET scan from earlier today compatible with metastatic adenopathy. 3. Lucent lesion within the posterior right second rib adjacent to the costovertebral junction measures 1 cm. Increased tracer uptake is identified within this area on the PET-CT from earlier today. Findings are concerning for metastatic disease. 4. Trace left pleural effusion. 5. Coronary artery calcifications. 6.  Aortic Atherosclerosis (ICD10-I70.0). Electronically Signed   By: Kerby Moors M.D.   On: 07/15/2022 12:49   NM PET Image Initial (PI) Skull Base To Thigh  Result Date: 07/15/2022 CLINICAL DATA:  Initial treatment strategy for lung nodules. EXAM: NUCLEAR MEDICINE PET SKULL BASE TO THIGH TECHNIQUE: 9.18 mCi F-18 FDG was injected intravenously. Full-ring PET imaging was performed from the skull base to thigh after the radiotracer. CT data was obtained and used for attenuation correction and anatomic localization. Fasting blood glucose: 111 mg/dl COMPARISON:  CT AP 07/09/2022 FINDINGS: Mediastinal blood pool activity: SUV max 2.37 Liver activity: SUV max NA NECK: No hypermetabolic lymph nodes in the neck. Incidental CT findings: None. CHEST: There are innumerable, tracer avid pulmonary nodules scattered throughout both lungs, including: -left upper lobe lung nodule measures 1.4 cm with SUV max of 6.13, image 29/9. -right upper lobe lung nodule measures 1.2 cm with SUV max 4.48, image 21/9. -right middle lobe lung nodule measures 2.4 cm with SUV max of 13.2, image 43/9. Tracer avid subcarinal lymph node measures 1.6 cm with SUV max of 10.49, image 75/4. Tracer avid right hilar lymph node has an SUV max of 6.90, image 76/4 Incidental CT findings: Small left pleural effusion. Aortic atherosclerosis and coronary artery calcifications. ABDOMEN/PELVIS: Tracer avid right external iliac lymph node measures 1.2 cm with SUV max of 8.57, image 161/4. The prostate gland is enlarged. There is asymmetric  increased uptake along the posterior and left side of the gland with SUV max 7.90. Incidental CT findings: Aortic atherosclerosis. Nonobstructing right renal calculi. MULTIFOCAL TRACER AVID BONE METASTASES ARE NOTED, INCLUDING: MULTIFOCAL TRACER AVID BONE METASTASES ARE NOTED, INCLUDING -Mildly sclerotic lesion within the left inferior pubic rami has an SUV max of 9.47, image 196/4 -Mildly sclerotic lesion within the right lower sacrum has an SUV max of 6.29, image 170/4. -Tracer avid lesion involving  the right posterior T12 vertebral body (without significant CT changes) has an SUV max 5.90, image 111/4. Incidental CT findings: None. IMPRESSION: 1. There are innumerable tracer avid pulmonary nodules scattered throughout both lungs compatible with metastatic disease. 2. FDG avid right hilar, subcarinal, and right external iliac lymph nodes compatible with nodal metastasis. 3. Multifocal tracer avid bone metastases. 4. Enlarged prostate gland with asymmetric increased uptake along the posterior and left side of the gland. Cannot exclude underlying prostate gland malignancy. 5.  Aortic Atherosclerosis (ICD10-I70.0). Electronically Signed   By: Kerby Moors M.D.   On: 07/15/2022 10:31    ASSESSMENT: This is a very pleasant 77 years old white male recently diagnosed with metastatic poorly differentiated carcinoma, neuroendocrine carcinoma of suspicious prostate primary versus primary lung cancer presented with innumerable and bilateral pulmonary nodules in addition to right hilar and mediastinal lymphadenopathy and metastatic disease to several areas of the bone in addition to hypermetabolic activity in the prostate gland diagnosed in January 2024.   PLAN: I had a lengthy discussion with the patient and his family today about his current disease stage, prognosis and treatment options. I personally and independently reviewed the scan images and discussed the results and showed the images to the patient and his  family today. I recommended for the patient to complete the staging workup by ordering MRI of the brain to rule out brain metastasis. I also recommended for the patient to proceed with the prostate biopsy as recommended by Dr. Junious Kent next week. I explained to the patient that he has incurable condition and all the treatment will be of palliative nature.  He is not a good candidate for any surgical resection because of the widely metastatic disease. I gave the patient the option of palliative care and hospice referral versus consideration of palliative systemic chemotherapy with small cell regimens at would be appropriate for neuroendocrine carcinoma regardless of the origin whether in the lung or the prostate. The patient is interested in treatment and I recommended for him regimen consisting of carboplatin for AUC of 5 and Imfinzi 1500 Mg IV on day 1 as well as etoposide 100 Mg/M2 on days 1, 2 and 3 with Neulasta support on day 5. I discussed with the patient the adverse effect of this treatment including but not limited to alopecia, myelosuppression, nausea and vomiting, peripheral neuropathy, liver or renal dysfunction as well as immunotherapy adverse effects. I will arrange for the patient to have a chemotherapy education class before the first dose of his treatment. I will refer the patient to interventional etiology for consideration of Port-A-Cath placement. Also refer the patient to radiation oncology for consideration of palliative radiotherapy to the painful bone lesions. I will call his pharmacy with prescription for Compazine 10 mg p.o. every 6 hours as needed for nausea in addition to Emla cream to be applied to the Port-A-Cath site before treatment. The patient is expected to start the first dose of this treatment next week. I will see him back for follow-up visit in 2 weeks for evaluation and management of any adverse effect of his treatment. The patient was advised to call immediately  if he has any other concerning symptoms in the interval. The patient voices understanding of current disease status and treatment options and is in agreement with the current care plan.  All questions were answered. The patient knows to call the clinic with any problems, questions or concerns. We can certainly see the patient much sooner if necessary.  Thank you so  much for allowing me to participate in the care of Larry Kent. I will continue to follow up the patient with you and assist in his care.  The total time spent in the appointment was 90 minutes.  Disclaimer: This note was dictated with voice recognition software. Similar sounding words can inadvertently be transcribed and may not be corrected upon review.   Eilleen Kempf August 03, 2022, 2:27 PM

## 2022-08-03 NOTE — Progress Notes (Signed)
Erroneous encounter

## 2022-08-03 NOTE — Progress Notes (Signed)
START OFF PATHWAY REGIMEN - Non-Small Cell Lung   [id number]:Carboplatin IV + Durvalumab 1,500 mg IV + Etoposide IV q21 Days (C1-4) Followed by Durvalumab 1,500 mg IV q28 Days:   Cycles 1 through 4: A cycle is every 21 days:     Durvalumab      Carboplatin      Etoposide    Cycles 5 and beyond: A cycle is every 28 days:     Durvalumab   **Always confirm dose/schedule in your pharmacy ordering system**  Patient Characteristics: Stage IV Metastatic, Nonsquamous, Awaiting Molecular Test Results and Need to Start Chemotherapy, PS = 0, 1 Therapeutic Status: Stage IV Metastatic Histology: Nonsquamous Cell Broad Molecular Profiling Status: Awaiting Molecular Test Results and Need to Start Chemotherapy ECOG Performance Status: 1 Intent of Therapy: Non-Curative / Palliative Intent, Discussed with Patient

## 2022-08-04 ENCOUNTER — Other Ambulatory Visit: Payer: Self-pay

## 2022-08-04 ENCOUNTER — Telehealth: Payer: Self-pay | Admitting: Medical Oncology

## 2022-08-04 NOTE — Telephone Encounter (Signed)
Wife given information about pt upcoming port a cath appt and new chemo start date.

## 2022-08-05 ENCOUNTER — Encounter: Payer: Self-pay | Admitting: Internal Medicine

## 2022-08-05 ENCOUNTER — Telehealth: Payer: Self-pay | Admitting: Internal Medicine

## 2022-08-05 NOTE — Telephone Encounter (Signed)
Scheduled per 02/05 los, patient has been called and voicemail was left regarding upcoming appointments.

## 2022-08-06 ENCOUNTER — Ambulatory Visit
Admission: RE | Admit: 2022-08-06 | Discharge: 2022-08-06 | Disposition: A | Discharge status: Home / Self Care | Payer: Medicare Other | Source: Ambulatory Visit | Attending: Radiation Oncology | Admitting: Radiation Oncology

## 2022-08-06 ENCOUNTER — Encounter: Payer: Self-pay | Admitting: Radiation Oncology

## 2022-08-06 ENCOUNTER — Ambulatory Visit: Payer: Medicare Other | Admitting: Radiation Oncology

## 2022-08-06 ENCOUNTER — Other Ambulatory Visit: Payer: Self-pay

## 2022-08-06 VITALS — BP 127/68 | HR 62 | Temp 98.4°F | Resp 20 | Ht 68.0 in | Wt 180.0 lb

## 2022-08-06 DIAGNOSIS — Z800 Family history of malignant neoplasm of digestive organs: Secondary | ICD-10-CM | POA: Diagnosis not present

## 2022-08-06 DIAGNOSIS — Z85828 Personal history of other malignant neoplasm of skin: Secondary | ICD-10-CM | POA: Diagnosis not present

## 2022-08-06 DIAGNOSIS — I2510 Atherosclerotic heart disease of native coronary artery without angina pectoris: Secondary | ICD-10-CM | POA: Diagnosis not present

## 2022-08-06 DIAGNOSIS — E785 Hyperlipidemia, unspecified: Secondary | ICD-10-CM | POA: Diagnosis not present

## 2022-08-06 DIAGNOSIS — C801 Malignant (primary) neoplasm, unspecified: Secondary | ICD-10-CM | POA: Diagnosis not present

## 2022-08-06 DIAGNOSIS — N400 Enlarged prostate without lower urinary tract symptoms: Secondary | ICD-10-CM | POA: Insufficient documentation

## 2022-08-06 DIAGNOSIS — Z803 Family history of malignant neoplasm of breast: Secondary | ICD-10-CM | POA: Insufficient documentation

## 2022-08-06 DIAGNOSIS — J90 Pleural effusion, not elsewhere classified: Secondary | ICD-10-CM | POA: Insufficient documentation

## 2022-08-06 DIAGNOSIS — C7951 Secondary malignant neoplasm of bone: Secondary | ICD-10-CM | POA: Insufficient documentation

## 2022-08-06 DIAGNOSIS — Z87442 Personal history of urinary calculi: Secondary | ICD-10-CM | POA: Insufficient documentation

## 2022-08-06 DIAGNOSIS — C7A1 Malignant poorly differentiated neuroendocrine tumors: Secondary | ICD-10-CM

## 2022-08-06 DIAGNOSIS — Z79899 Other long term (current) drug therapy: Secondary | ICD-10-CM | POA: Diagnosis not present

## 2022-08-06 DIAGNOSIS — C7410 Malignant neoplasm of medulla of unspecified adrenal gland: Secondary | ICD-10-CM | POA: Diagnosis not present

## 2022-08-06 DIAGNOSIS — Z7982 Long term (current) use of aspirin: Secondary | ICD-10-CM | POA: Diagnosis not present

## 2022-08-06 DIAGNOSIS — R918 Other nonspecific abnormal finding of lung field: Secondary | ICD-10-CM | POA: Insufficient documentation

## 2022-08-06 DIAGNOSIS — Z8042 Family history of malignant neoplasm of prostate: Secondary | ICD-10-CM | POA: Diagnosis not present

## 2022-08-06 DIAGNOSIS — I700 Atherosclerosis of aorta: Secondary | ICD-10-CM | POA: Insufficient documentation

## 2022-08-06 NOTE — Progress Notes (Signed)
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   HISTORY OF PRESENT ILLNESS: Larry Kent is a 77 y.o. male with a newly diagnosed metastatic neuroendocrine carcinoma of suspicious prostate primary versus primary lung cancer seen at the request of Dr. Julien Nordmann for consideration of radiation therapy.   Patient is a never smoker. He first noticed increased urinary frequency in November 2024. He proceeded with CT urogram on 07/10/22 which showed a 3 mm right renal stone with a mildly enlarged prostate. Numerous solid pulmonary nodules were also seen bilaterally. PET on 07/15/22 showed innumerable tracer avid pulmonary nodules scattered throughout both lungs, consistent with metastatic disease. There was FDG avid right hilar, subcarinal and right external iliac lymph nodes, along with multifocal tracer avid bone metastasis including mildly sclerotic lesion within the left anterior pubic rami, mildly sclerotic lesion within the right lower sacrum as well as tracer avid lesion involving the right posterior T12 vertebral body. The scan also showed an enlarged prostate gland with asymmetric increased uptake along the posterior and left side of the gland and underlying prostate gland malignancy cannot be excluded. Bronchoscopy on 07/28/22 with Dr. Valeta Harms revealed poorly differentiated carcinoma. Tumor cells were positive for TTF-1, synaptophysin, and prostein. They are negative for CK20, CD56, chromogranin, Napsin A, CK7, p40, Melan-A, S100, PSA and CDX2. TTF-1 positivity is typical of primary lung carcinoma and synaptophysin supports neuroendocrine differentiation.  However, the prostein positivity raises the possibility  of metastatic prostate carcinoma. TTF-1 can be positive in prostate carcinoma with neuroendocrine differentiation.   Patient met with medical oncologist, Dr. Julien Nordmann, on 08/03/22. He discussed at length the prognosis of patient's disease. He recommended further imaging for a complete staging workup. Brain MRI is scheduled for 08/07/22. He gave the patient the option of palliative care versus palliative systemic chemotherapy with small cell regimens appropriate for a neuroendocrine carcinoma. The patient was interested in proceeding with chemotherapy treatment. Dr. Julien Nordmann recommended carboplatin for AUC of 5 and Imfinzi 1500 Mg IV on day 1 as well as etoposide 100 Mg/M2 on days 1, 2 and 3 with Neulasta support on day 5 as this is appropriate for neuroendocrine carcinoma regardless of the origin. Patient is scheduled to begin his first regimen on 08/11/22.   Patient is scheduled for a prostate biopsy on 08/12/22 with Dr. Junious Silk.   Patient states he is not having any new or worsening pain. He denies any pain to his lower back, hip, groin, or buttock region. He states he does have mild lower back pain when he is sitting for long periods of time but that has been present for 20 years. He denies any lower extremity numbness, tingling, or weakness. He denies incontinence. He states his breathing is okay at this time. He experiences shortness of breath with exertion and warm weather. He denies any cough or hemoptysis.   Patient is a never smoker and drinks alcohol occasionally. 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 He is married with two children. His son lives at home and his daughter is living in [place].   PREVIOUS RADIATION THERAPY: No   PAST MEDICAL HISTORY:  Past  Medical History:  Diagnosis Date   Allergy    Arthritis    both knees    BCC (basal cell carcinoma), arm, right 09/2019   MOHS ([place])   BCC (basal cell carcinoma), face 2014   L  preauricular s/p MOHS   BCC (basal cell carcinoma), face 02/2018   L upper lip   BPH (benign prostatic hypertrophy)    Dr. Junious Silk @ [facility]   Carotid stenosis 03/9322   RICA 55-73%, LICA 2-20%, L vertebral occlusion, rpt 1 yr   Cataract    removed both eyes    FHx: colon cancer    FHx: prostate cancer    Heart murmur    mild aortic stenosis    History of kidney stones    Hyperlipidemia    borderline- on rosuvastatin now normal    Occlusion of right vertebral artery 05/2015       PAST SURGICAL HISTORY: Past Surgical History:  Procedure Laterality Date   BASAL CELL CARCINOMA EXCISION  05/2018   lip, 2020 x3  basal cells removed    CATARACT EXTRACTION  2008   Left   CATARACT EXTRACTION  2013   Right ([place])   COLONOSCOPY  ~2010   medium int hemorrhoids, o/w WNL, rpt 5 yrs given fmhx (Dr. Oletta Lamas)   COLONOSCOPY  08/2019   TAs, diverticulosis, rpt 3 yrs ([place])   ENDARTERECTOMY Right 12/01/2019   Procedure: RIGHT ENDARTERECTOMY CAROTID;  Surgeon: [name], MD;  Location: [facility];  Service: Vascular;  Laterality: Right;   exercise treadmill  11/2005   WNL [place])   FINE NEEDLE ASPIRATION  07/28/2022   Procedure: FINE NEEDLE ASPIRATION (FNA) LINEAR;  Surgeon: Garner Nash, DO;  Location: [facility];  Service: Pulmonary;;   HERNIA REPAIR  2008   Right   MOHS SURGERY Left spring 2014   basal cell face   PATCH ANGIOPLASTY Right 12/01/2019   Procedure: [place];  Surgeon: [name], MD;  Location: [facility];  Service: Vascular;  Laterality: Right;   Testicular Biopsy  2003   benign, varicocele   US ECHOCARDIOGRAPHY  11/2005   aortic sclerosis, EF 25-42%, diastolic dysfunction   VIDEO BRONCHOSCOPY WITH ENDOBRONCHIAL ULTRASOUND Bilateral 07/28/2022   Procedure: VIDEO BRONCHOSCOPY WITH ENDOBRONCHIAL ULTRASOUND;  Surgeon: Garner Nash, DO;  Location: [facility];  Service: Pulmonary;  Laterality: Bilateral;      FAMILY HISTORY:  Family History  Problem Relation Age of Onset   Cancer Father 57       prostate   Coronary artery disease Father 72       MI   Prostate cancer Father    Cancer Paternal Grandfather 46       prostate   Prostate cancer Paternal Grandfather    Stroke Mother    Hypertension Mother    Cancer Mother 56       colon   Colon cancer Mother    Coronary artery disease Maternal Uncle    Cancer Sister 52       breast   Breast cancer Sister    Colon cancer Maternal Grandmother    Diabetes Neg Hx    Colon polyps Neg Hx    Esophageal cancer Neg Hx    Rectal cancer Neg Hx    Stomach cancer Neg Hx      SOCIAL HISTORY:  reports that he has never smoked. He has never used smokeless tobacco. He reports current alcohol use. He reports that he does  not use drugs.   ALLERGIES: Patient has no known allergies.   MEDICATIONS:  Current Outpatient Medications  Medication Sig Dispense Refill   acetaminophen (TYLENOL) 500 MG tablet Take 500 mg by mouth every 6 (six) hours as needed for moderate pain.     aspirin EC 81 MG tablet Take 1 tablet (81 mg total) by mouth daily. 90 tablet 3   finasteride (PROSCAR) 5 MG tablet Take 5 mg by mouth daily.     fluticasone (FLONASE) 50 MCG/ACT nasal spray Place 1 spray into both nostrils daily as needed.     ibuprofen (ADVIL) 200 MG tablet Take 400 mg by mouth every 6 (six) hours as needed for moderate pain.     lidocaine-prilocaine (EMLA) cream Apply to the Port-A-Cath site 30-60 minutes before chemotherapy treatment 30 g 0   Multiple Vitamin (MULTIVITAMIN) tablet Take 1 tablet by mouth daily.     prochlorperazine (COMPAZINE) 10 MG tablet Take 1 tablet (10 mg total) by mouth every 6 (six) hours as needed for nausea or vomiting. 30 tablet 0   rosuvastatin (CRESTOR) 20 MG tablet TAKE 1 TABLET BY MOUTH EVERY DAY 90 tablet 1   tamsulosin (FLOMAX) 0.4 MG CAPS capsule Take 0.4 mg by mouth 2 (two) times daily.     Zinc 25 MG TABS Take 25 mg by  mouth daily.     No current facility-administered medications for this encounter.     REVIEW OF SYSTEMS: As per HPI     PHYSICAL EXAM:  Wt Readings from Last 3 Encounters:  08/06/22 180 lb (81.6 kg)  08/03/22 180 lb 12.8 oz (82 kg)  07/28/22 186 lb (84.4 kg)   Temp Readings from Last 3 Encounters:  08/06/22 98.4 F (36.9 C) (Oral)  08/03/22 98.4 F (36.9 C) (Oral)  07/28/22 98 F (36.7 C)   BP Readings from Last 3 Encounters:  08/06/22 127/68  08/03/22 125/70  07/28/22 (!) 145/79   Pulse Readings from Last 3 Encounters:  08/06/22 62  08/03/22 81  07/28/22 73   Pain Assessment Pain Score: 0-No pain/10  In general this is a well appearing male in no acute distress. He's alert and oriented x4 and appropriate throughout the examination. Cardiopulmonary assessment is negative for acute distress and he exhibits normal effort.     ECOG = 1  0 - Asymptomatic (Fully active, able to carry on all predisease activities without restriction)  1 - Symptomatic but completely ambulatory (Restricted in physically strenuous activity but ambulatory and able to carry out work of a light or sedentary nature. For example, light housework, office work)  2 - Symptomatic, <50% in bed during the day (Ambulatory and capable of all self care but unable to carry out any work activities. Up and about more than 50% of waking hours)  3 - Symptomatic, >50% in bed, but not bedbound (Capable of only limited self-care, confined to bed or chair 50% or more of waking hours)  4 - Bedbound (Completely disabled. Cannot carry on any self-care. Totally confined to bed or chair)  5 - Death   Eustace Pen MM, Creech RH, Tormey DC, et al. [phone]). "Toxicity and response criteria of the [facility] Group". [place] Oncol. 5 (6): 649-55    LABORATORY DATA:  Lab Results  Component Value Date   WBC 5.9 08/03/2022   HGB 12.7 (L) 08/03/2022   HCT 36.8 (L) 08/03/2022   MCV 101.1 (H) 08/03/2022    PLT 191 08/03/2022   Lab Results  Component Value Date   NA 137 08/03/2022   K 4.4 08/03/2022   CL 105 08/03/2022   CO2 27 08/03/2022   Lab Results  Component Value Date   ALT 23 08/03/2022   AST 27 08/03/2022   ALKPHOS 109 08/03/2022   BILITOT 0.5 08/03/2022      RADIOGRAPHY: NM PET SUPER D CT  Result Date: 07/15/2022 CLINICAL DATA:  Multiple pulmonary nodules. * Tracking Code: BO * EXAM: CT CHEST WITHOUT CONTRAST TECHNIQUE: Multidetector CT imaging of the chest was performed using thin slice collimation for electromagnetic bronchoscopy planning purposes, without intravenous contrast. RADIATION DOSE REDUCTION: This exam was performed according to the departmental dose-optimization program which includes automated exposure control, adjustment of the mA and/or kV according to patient size and/or use of iterative reconstruction technique. COMPARISON:  PET-CT performed earlier today FINDINGS: Cardiovascular: Heart size is normal. No pericardial effusion. Aortic atherosclerosis and 3 vessel coronary artery calcifications. Mediastinum/Nodes: Thyroid gland, trachea and esophagus are unremarkable. No enlarged axillary lymph nodes. Subcarinal lymph node is enlarged measuring 1.8 cm and exhibits increased FDG uptake on PET scan from earlier today, image 28/2 the hilar lymph nodes are suboptimally evaluated due to lack of IV contrast. Lungs/Pleura: Trace left pleural effusion. No airspace disease. Innumerable pulmonary nodules are scattered throughout both lungs. Index lesions include: -dominant nodule is in the right middle lobe measuring 2.4 cm, image 88/4. -posterior left upper lobe lung nodule measures 1.4 cm, image 59/4. -central right upper lobe lung nodule measures 1.1 cm, image 39/4. -nodule at the anterior left lung base measures 2 cm, image 112/4. -posteromedial right lower lobe lung nodule measures 1.1 cm, image 106/4. Upper Abdomen: No acute abnormality. Musculoskeletal: Lucent lesion within  the posterior right second rib adjacent to the costovertebral junction measures 1 cm. Increased tracer uptake is identified within this area on the PET-CT from earlier today, image 5/2. IMPRESSION: 1. Innumerable pulmonary nodules are scattered throughout both lungs compatible with metastatic disease. The dominant nodule is in the right middle lobe measuring 2.4 cm. 2. Enlarged subcarinal lymph node exhibits increased FDG uptake on PET scan from earlier today compatible with metastatic adenopathy. 3. Lucent lesion within the posterior right second rib adjacent to the costovertebral junction measures 1 cm. Increased tracer uptake is identified within this area on the PET-CT from earlier today. Findings are concerning for metastatic disease. 4. Trace left pleural effusion. 5. Coronary artery calcifications. 6.  Aortic Atherosclerosis (ICD10-I70.0). Electronically Signed   By: Kerby Moors M.D.   On: 07/15/2022 12:49   NM PET Image Initial (PI) Skull Base To Thigh  Result Date: 07/15/2022 CLINICAL DATA:  Initial treatment strategy for lung nodules. EXAM: NUCLEAR MEDICINE PET SKULL BASE TO THIGH TECHNIQUE: 9.18 mCi F-18 FDG was injected intravenously. Full-ring PET imaging was performed from the skull base to thigh after the radiotracer. CT data was obtained and used for attenuation correction and anatomic localization. Fasting blood glucose: 111 mg/dl COMPARISON:  CT AP 07/09/2022 FINDINGS: Mediastinal blood pool activity: SUV max 2.37 Liver activity: SUV max NA NECK: No hypermetabolic lymph nodes in the neck. Incidental CT findings: None. CHEST: There are innumerable, tracer avid pulmonary nodules scattered throughout both lungs, including: -left upper lobe lung nodule measures 1.4 cm with SUV max of 6.13, image 29/9. -right upper lobe lung nodule measures 1.2 cm with SUV max 4.48, image 21/9. -right middle lobe lung nodule measures 2.4 cm with SUV max of 13.2, image 43/9. Tracer avid subcarinal lymph node  measures 1.6 cm  with SUV max of 10.49, image 75/4. Tracer avid right hilar lymph node has an SUV max of 6.90, image 76/4 Incidental CT findings: Small left pleural effusion. Aortic atherosclerosis and coronary artery calcifications. ABDOMEN/PELVIS: Tracer avid right external iliac lymph node measures 1.2 cm with SUV max of 8.57, image 161/4. The prostate gland is enlarged. There is asymmetric increased uptake along the posterior and left side of the gland with SUV max 7.90. Incidental CT findings: Aortic atherosclerosis. Nonobstructing right renal calculi. MULTIFOCAL TRACER AVID BONE METASTASES ARE NOTED, INCLUDING: MULTIFOCAL TRACER AVID BONE METASTASES ARE NOTED, INCLUDING -Mildly sclerotic lesion within the left inferior pubic rami has an SUV max of 9.47, image 196/4 -Mildly sclerotic lesion within the right lower sacrum has an SUV max of 6.29, image 170/4. -Tracer avid lesion involving the right posterior T12 vertebral body (without significant CT changes) has an SUV max 5.90, image 111/4. Incidental CT findings: None. IMPRESSION: 1. There are innumerable tracer avid pulmonary nodules scattered throughout both lungs compatible with metastatic disease. 2. FDG avid right hilar, subcarinal, and right external iliac lymph nodes compatible with nodal metastasis. 3. Multifocal tracer avid bone metastases. 4. Enlarged prostate gland with asymmetric increased uptake along the posterior and left side of the gland. Cannot exclude underlying prostate gland malignancy. 5.  Aortic Atherosclerosis (ICD10-I70.0). Electronically Signed   By: Kerby Moors M.D.   On: 07/15/2022 10:31       IMPRESSION/PLAN: 1.  Metastatic poorly differentiated carcinoma, neuroendocrine carcinoma of suspicious prostate primary versus primary lung cancer presented with innumerable and bilateral pulmonary nodules in addition to right hilar and mediastinal lymphadenopathy and metastatic disease to several areas of the bone in addition to  hypermetabolic activity prostate gland.  Patient is not experiencing any pain consistent with the bone lesions visualized on PET at this time. Bone lesions do not pose a risk for spinal cord compression or bone fracture. Today, we discussed the nature of metastatic bone lesions and the role of radiation therapy in the treating them. We discussed the details of radiation therapy and the possible side effects. We discussed the risks and benefits of continuing to monitor the bone lesions vs treating them with radiation at this time. At the conclusion of the conversation the patient decided to forego radiation therapy since he is not symptomatic at this time. We educated the patient on what pain from metastatic disease to the bone typically feels like and where he could expect to experience it. Patient knows to inform us if he does start to experience any new or worsening pain. He knows he can call back with questions or concerns at any time.  In a visit lasting 60 minutes, greater than 50% of the time was spent face to face discussing the patient's condition, in preparation for the discussion, and coordinating the patient's care.   The above documentation reflects my direct findings during this shared patient visit. Please see the separate note by Dr. Lisbeth Renshaw on this date for the remainder of the patient's plan of care.    Leona Singleton, [place]    **Disclaimer: This note was dictated with voice recognition software. Similar sounding words can inadvertently be transcribed and this note may contain transcription errors which may not have been corrected upon publication of note.**

## 2022-08-06 NOTE — Progress Notes (Signed)
Pt had called requesting clarification of his schedule next week. I checked his chart and reviewed his upcoming appointments with him. I told him that he starts treatment on Tuesday morning 2/13 @ 7am for his pre-op prior to his port placement at 930, followed by his lab appt and chemotherapy. He also has chemothearpy on 2/14 and 2/15 and he is aware of those appointments. The pt inquired about the appt on Saturday and I explained that he will need to get an injection with a medication to help his white blood cells keep from dropping too low which may reduce his risk of getting an infection. Reviewed precautions to take while on chemotherapy, handwashing, masking, avoiding those who are sick, to help protect himself. Also reminded him of his chemo education appointment tomorrow 2/9 at 11am and his brain MRI at 6pm. Pt verbalized confirmation of those appointments.

## 2022-08-06 NOTE — Progress Notes (Signed)
Nursing interview for Malignant neoplasm of medulla of unspecified adrenal gland.  Patient identity verified. No issues reported at this time.  Meaningful use complete.  BP 127/68 (BP Location: Right Arm, Patient Position: Sitting, Cuff Size: Normal)   Pulse 62   Temp 98.4 F (36.9 C) (Oral)   Resp 20   Ht 5\' 8"  (1.727 m)   Wt 180 lb (81.6 kg)   SpO2 100%   BMI 27.37 kg/m   This concludes the interview.   Leandra Kern, LPN

## 2022-08-06 NOTE — Progress Notes (Signed)
Pharmacist Chemotherapy Monitoring - Initial Assessment    Anticipated start date: 08/11/22   The following has been reviewed per standard work regarding the patient's treatment regimen: The patient's diagnosis, treatment plan and drug doses, and organ/hematologic function Lab orders and baseline tests specific to treatment regimen  The treatment plan start date, drug sequencing, and pre-medications Prior authorization status  Patient's documented medication list, including drug-drug interaction screen and prescriptions for anti-emetics and supportive care specific to the treatment regimen The drug concentrations, fluid compatibility, administration routes, and timing of the medications to be used The patient's access for treatment and lifetime cumulative dose history, if applicable  The patient's medication allergies and previous infusion related reactions, if applicable   Changes made to treatment plan:  N/A  Follow up needed:  N/A   Kennith Center, Pharm.D., CPP 08/06/2022@12 :48 PM

## 2022-08-07 ENCOUNTER — Ambulatory Visit (HOSPITAL_COMMUNITY): Admission: RE | Admit: 2022-08-07 | Payer: Medicare Other | Source: Ambulatory Visit

## 2022-08-07 ENCOUNTER — Ambulatory Visit (HOSPITAL_COMMUNITY)
Admission: RE | Admit: 2022-08-07 | Discharge: 2022-08-07 | Disposition: A | Discharge status: Home / Self Care | Payer: Medicare Other | Source: Ambulatory Visit | Attending: Internal Medicine | Admitting: Internal Medicine

## 2022-08-07 ENCOUNTER — Inpatient Hospital Stay: Payer: Medicare Other

## 2022-08-07 DIAGNOSIS — C3490 Malignant neoplasm of unspecified part of unspecified bronchus or lung: Secondary | ICD-10-CM | POA: Diagnosis not present

## 2022-08-07 DIAGNOSIS — I619 Nontraumatic intracerebral hemorrhage, unspecified: Secondary | ICD-10-CM | POA: Diagnosis not present

## 2022-08-07 MED ORDER — GADOBUTROL 1 MMOL/ML IV SOLN
8.0000 mL | Freq: Once | INTRAVENOUS | Status: AC | PRN
  Administered 2022-08-07: 8 mL via INTRAVENOUS

## 2022-08-09 ENCOUNTER — Other Ambulatory Visit: Payer: Self-pay | Admitting: Radiology

## 2022-08-10 ENCOUNTER — Other Ambulatory Visit: Payer: Medicare Other

## 2022-08-10 ENCOUNTER — Ambulatory Visit: Payer: Medicare Other

## 2022-08-10 MED FILL — Dexamethasone Sodium Phosphate Inj 100 MG/10ML: INTRAMUSCULAR | Qty: 1 | Status: AC

## 2022-08-10 MED FILL — Fosaprepitant Dimeglumine For IV Infusion 150 MG (Base Eq): INTRAVENOUS | Qty: 5 | Status: AC

## 2022-08-11 ENCOUNTER — Ambulatory Visit: Payer: Medicare Other

## 2022-08-11 ENCOUNTER — Ambulatory Visit (HOSPITAL_COMMUNITY)
Admission: RE | Admit: 2022-08-11 | Discharge: 2022-08-11 | Disposition: A | Discharge status: Home / Self Care | Payer: Medicare Other | Source: Ambulatory Visit | Attending: Internal Medicine | Admitting: Internal Medicine

## 2022-08-11 ENCOUNTER — Inpatient Hospital Stay: Payer: Medicare Other

## 2022-08-11 ENCOUNTER — Encounter (HOSPITAL_COMMUNITY): Payer: Self-pay

## 2022-08-11 ENCOUNTER — Other Ambulatory Visit: Payer: Self-pay | Admitting: Internal Medicine

## 2022-08-11 ENCOUNTER — Other Ambulatory Visit: Payer: Self-pay

## 2022-08-11 VITALS — BP 126/75 | HR 67 | Temp 98.0°F | Resp 16 | Wt 179.2 lb

## 2022-08-11 DIAGNOSIS — C7A1 Malignant poorly differentiated neuroendocrine tumors: Secondary | ICD-10-CM

## 2022-08-11 DIAGNOSIS — Z79899 Other long term (current) drug therapy: Secondary | ICD-10-CM | POA: Diagnosis not present

## 2022-08-11 DIAGNOSIS — Z452 Encounter for adjustment and management of vascular access device: Secondary | ICD-10-CM | POA: Diagnosis not present

## 2022-08-11 DIAGNOSIS — Z95828 Presence of other vascular implants and grafts: Secondary | ICD-10-CM

## 2022-08-11 DIAGNOSIS — R590 Localized enlarged lymph nodes: Secondary | ICD-10-CM | POA: Diagnosis not present

## 2022-08-11 DIAGNOSIS — C3490 Malignant neoplasm of unspecified part of unspecified bronchus or lung: Secondary | ICD-10-CM | POA: Diagnosis not present

## 2022-08-11 DIAGNOSIS — C7951 Secondary malignant neoplasm of bone: Secondary | ICD-10-CM | POA: Diagnosis not present

## 2022-08-11 DIAGNOSIS — Z5111 Encounter for antineoplastic chemotherapy: Secondary | ICD-10-CM | POA: Diagnosis not present

## 2022-08-11 DIAGNOSIS — R918 Other nonspecific abnormal finding of lung field: Secondary | ICD-10-CM | POA: Diagnosis not present

## 2022-08-11 DIAGNOSIS — Z960 Presence of urogenital implants: Secondary | ICD-10-CM | POA: Insufficient documentation

## 2022-08-11 HISTORY — PX: IR IMAGING GUIDED PORT INSERTION: IMG5740

## 2022-08-11 LAB — CBC WITH DIFFERENTIAL (CANCER CENTER ONLY)
Abs Immature Granulocytes: 0.01 10*3/uL (ref 0.00–0.07)
Basophils Absolute: 0 10*3/uL (ref 0.0–0.1)
Basophils Relative: 1 %
Eosinophils Absolute: 0.1 10*3/uL (ref 0.0–0.5)
Eosinophils Relative: 1 %
HCT: 34.6 % — ABNORMAL LOW (ref 39.0–52.0)
Hemoglobin: 12.1 g/dL — ABNORMAL LOW (ref 13.0–17.0)
Immature Granulocytes: 0 %
Lymphocytes Relative: 17 %
Lymphs Abs: 0.6 10*3/uL — ABNORMAL LOW (ref 0.7–4.0)
MCH: 35.2 pg — ABNORMAL HIGH (ref 26.0–34.0)
MCHC: 35 g/dL (ref 30.0–36.0)
MCV: 100.6 fL — ABNORMAL HIGH (ref 80.0–100.0)
Monocytes Absolute: 0.3 10*3/uL (ref 0.1–1.0)
Monocytes Relative: 8 %
Neutro Abs: 2.7 10*3/uL (ref 1.7–7.7)
Neutrophils Relative %: 73 %
Platelet Count: 155 10*3/uL (ref 150–400)
RBC: 3.44 MIL/uL — ABNORMAL LOW (ref 4.22–5.81)
RDW: 13.2 % (ref 11.5–15.5)
WBC Count: 3.7 10*3/uL — ABNORMAL LOW (ref 4.0–10.5)
nRBC: 0 % (ref 0.0–0.2)

## 2022-08-11 LAB — CMP (CANCER CENTER ONLY)
ALT: 21 U/L (ref 0–44)
AST: 26 U/L (ref 15–41)
Albumin: 3.8 g/dL (ref 3.5–5.0)
Alkaline Phosphatase: 113 U/L (ref 38–126)
Anion gap: 4 — ABNORMAL LOW (ref 5–15)
BUN: 18 mg/dL (ref 8–23)
CO2: 27 mmol/L (ref 22–32)
Calcium: 8.9 mg/dL (ref 8.9–10.3)
Chloride: 106 mmol/L (ref 98–111)
Creatinine: 0.82 mg/dL (ref 0.61–1.24)
GFR, Estimated: >60 mL/min (ref >60)
Glucose, Bld: 114 mg/dL — ABNORMAL HIGH (ref 70–99)
Potassium: 3.8 mmol/L (ref 3.5–5.1)
Sodium: 137 mmol/L (ref 135–145)
Total Bilirubin: 0.5 mg/dL (ref 0.3–1.2)
Total Protein: 6.7 g/dL (ref 6.5–8.1)

## 2022-08-11 LAB — GLUCOSE, CAPILLARY: Glucose-Capillary: 103 mg/dL — ABNORMAL HIGH (ref 70–99)

## 2022-08-11 LAB — TSH: TSH: 1.634 u[IU]/mL (ref 0.350–4.500)

## 2022-08-11 MED ORDER — FENTANYL CITRATE (PF) 100 MCG/2ML IJ SOLN
INTRAMUSCULAR | Status: AC
  Filled 2022-08-11: qty 2

## 2022-08-11 MED ORDER — MIDAZOLAM HCL 2 MG/2ML IJ SOLN
INTRAMUSCULAR | Status: AC | PRN
  Administered 2022-08-11 (×2): 1 mg via INTRAVENOUS

## 2022-08-11 MED ORDER — SODIUM CHLORIDE 0.9 % IV SOLN
150.0000 mg | Freq: Once | INTRAVENOUS | Status: AC
  Administered 2022-08-11: 150 mg via INTRAVENOUS
  Filled 2022-08-11: qty 150

## 2022-08-11 MED ORDER — LIDOCAINE-EPINEPHRINE 1 %-1:100000 IJ SOLN
INTRAMUSCULAR | Status: AC | PRN
  Administered 2022-08-11: 20 mL

## 2022-08-11 MED ORDER — FENTANYL CITRATE (PF) 100 MCG/2ML IJ SOLN
INTRAMUSCULAR | Status: AC | PRN
  Administered 2022-08-11 (×2): 50 ug via INTRAVENOUS

## 2022-08-11 MED ORDER — SODIUM CHLORIDE 0.9 % IV SOLN: Freq: Once | INTRAVENOUS | Status: AC

## 2022-08-11 MED ORDER — SODIUM CHLORIDE 0.9 % IV SOLN
489.5000 mg | Freq: Once | INTRAVENOUS | Status: AC
  Administered 2022-08-11: 500 mg via INTRAVENOUS
  Filled 2022-08-11: qty 50

## 2022-08-11 MED ORDER — SODIUM CHLORIDE 0.9 % IV SOLN
1500.0000 mg | Freq: Once | INTRAVENOUS | Status: AC
  Administered 2022-08-11: 1500 mg via INTRAVENOUS
  Filled 2022-08-11: qty 30

## 2022-08-11 MED ORDER — MIDAZOLAM HCL 2 MG/2ML IJ SOLN
INTRAMUSCULAR | Status: AC
  Filled 2022-08-11: qty 2

## 2022-08-11 MED ORDER — SODIUM CHLORIDE 0.9% FLUSH: 10.0000 mL | INTRAVENOUS | Status: DC | PRN

## 2022-08-11 MED ORDER — SODIUM CHLORIDE 0.9 % IV SOLN
100.0000 mg/m2 | Freq: Once | INTRAVENOUS | Status: AC
  Administered 2022-08-11: 200 mg via INTRAVENOUS
  Filled 2022-08-11: qty 10

## 2022-08-11 MED ORDER — SODIUM CHLORIDE 0.9% FLUSH
10.0000 mL | Freq: Once | INTRAVENOUS | Status: AC
  Administered 2022-08-11: 10 mL

## 2022-08-11 MED ORDER — LIDOCAINE-EPINEPHRINE 1 %-1:100000 IJ SOLN
INTRAMUSCULAR | Status: AC
  Filled 2022-08-11: qty 1

## 2022-08-11 MED ORDER — PALONOSETRON HCL INJECTION 0.25 MG/5ML
0.2500 mg | Freq: Once | INTRAVENOUS | Status: AC
  Administered 2022-08-11: 0.25 mg via INTRAVENOUS
  Filled 2022-08-11: qty 5

## 2022-08-11 MED ORDER — SODIUM CHLORIDE 0.9 % IV SOLN
10.0000 mg | Freq: Once | INTRAVENOUS | Status: AC
  Administered 2022-08-11: 10 mg via INTRAVENOUS
  Filled 2022-08-11: qty 10

## 2022-08-11 MED ORDER — HEPARIN SOD (PORK) LOCK FLUSH 100 UNIT/ML IV SOLN: 500.0000 [IU] | Freq: Once | INTRAVENOUS | Status: DC | PRN

## 2022-08-11 MED ORDER — SODIUM CHLORIDE 0.9 % IV SOLN: INTRAVENOUS | Status: DC

## 2022-08-11 MED FILL — Dexamethasone Sodium Phosphate Inj 100 MG/10ML: INTRAMUSCULAR | Qty: 1 | Status: AC

## 2022-08-11 NOTE — Patient Instructions (Addendum)
[place]  Discharge Instructions: Thank you for choosing [facility] to provide your oncology and hematology care.   If you have a lab appointment with the [facility], please go directly to the [facility] and check in at the registration area.   Wear comfortable clothing and clothing appropriate for easy access to any Portacath or PICC line.   We strive to give you quality time with your provider. You may need to reschedule your appointment if you arrive late (15 or more minutes).  Arriving late affects you and other patients whose appointments are after yours.  Also, if you miss three or more appointments without notifying the office, you may be dismissed from the clinic at the provider's discretion.      For prescription refill requests, have your pharmacy contact our office and allow 72 hours for refills to be completed.    Today you received the following chemotherapy and/or immunotherapy agents: Durvalumab (Imfinzi), Carboplatin, Etoposide      To help prevent nausea and vomiting after your treatment, we encourage you to take your nausea medication as directed.  BELOW ARE SYMPTOMS THAT SHOULD BE REPORTED IMMEDIATELY: *FEVER GREATER THAN 100.4 F (38 C) OR HIGHER *CHILLS OR SWEATING *NAUSEA AND VOMITING THAT IS NOT CONTROLLED WITH YOUR NAUSEA MEDICATION *UNUSUAL SHORTNESS OF BREATH *UNUSUAL BRUISING OR BLEEDING *URINARY PROBLEMS (pain or burning when urinating, or frequent urination) *BOWEL PROBLEMS (unusual diarrhea, constipation, pain near the anus) TENDERNESS IN MOUTH AND THROAT WITH OR WITHOUT PRESENCE OF ULCERS (sore throat, sores in mouth, or a toothache) UNUSUAL RASH, SWELLING OR PAIN  UNUSUAL VAGINAL DISCHARGE OR ITCHING   Items with * indicate a potential emergency and should be followed up as soon as possible or go to the Emergency Department if any problems should occur.  Please show the CHEMOTHERAPY ALERT CARD  or IMMUNOTHERAPY ALERT CARD at check-in to the Emergency Department and triage nurse.  Should you have questions after your visit or need to cancel or reschedule your appointment, please contact [facility]  Dept: [phone]  and follow the prompts.  Office hours are 8:00 a.m. to 4:30 p.m. Monday - Friday. Please note that voicemails left after 4:00 p.m. may not be returned until the following business day.  We are closed weekends and major holidays. You have access to a nurse at all times for urgent questions. Please call the main number to the clinic Dept: [phone] and follow the prompts.   For any non-urgent questions, you may also contact your provider using MyChart. We now offer e-Visits for anyone 43 and older to request care online for non-urgent symptoms. For details visit [email or web address].   Also download the MyChart app! Go to the app store, search "MyChart", open the app, select [facility], and log in with your MyChart username and password.  Durvalumab Injection What is this medication? DURVALUMAB (dur VAL ue mab) treats some types of cancer. It works by helping your immune system slow or stop the spread of cancer cells. It is a monoclonal antibody. This medicine may be used for other purposes; ask your health care provider or pharmacist if you have questions. COMMON BRAND NAME(S): IMFINZI What should I tell my care team before I take this medication? They need to know if you have any of these conditions: Allogeneic stem cell transplant (uses someone else's stem cells) Autoimmune diseases, such as Crohn disease, ulcerative colitis, lupus History of chest  radiation Nervous system problems, such as Guillain-Barre syndrome, myasthenia gravis Organ transplant An unusual or allergic reaction to durvalumab, other medications, foods, dyes, or preservatives Pregnant or trying to get pregnant Breast-feeding How should I use this  medication? This medication is infused into a vein. It is given by your care team in a hospital or clinic setting. A special MedGuide will be given to you before each treatment. Be sure to read this information carefully each time. Talk to your care team about the use of this medication in children. Special care may be needed. Overdosage: If you think you have taken too much of this medicine contact a poison control center or emergency room at once. NOTE: This medicine is only for you. Do not share this medicine with others. What if I miss a dose? Keep appointments for follow-up doses. It is important not to miss your dose. Call your care team if you are unable to keep an appointment. What may interact with this medication? Interactions have not been studied. This list may not describe all possible interactions. Give your health care provider a list of all the medicines, herbs, non-prescription drugs, or dietary supplements you use. Also tell them if you smoke, drink alcohol, or use illegal drugs. Some items may interact with your medicine. What should I watch for while using this medication? Your condition will be monitored carefully while you are receiving this medication. You may need blood work while taking this medication. This medication may cause serious skin reactions. They can happen weeks to months after starting the medication. Contact your care team right away if you notice fevers or flu-like symptoms with a rash. The rash may be red or purple and then turn into blisters or peeling of the skin. You may also notice a red rash with swelling of the face, lips, or lymph nodes in your neck or under your arms. Tell your care team right away if you have any change in your eyesight. Talk to your care team if you may be pregnant. Serious birth defects can occur if you take this medication during pregnancy and for 3 months after the last dose. You will need a negative pregnancy test before starting  this medication. Contraception is recommended while taking this medication and for 3 months after the last dose. Your care team can help you find the option that works for you. Do not breastfeed while taking this medication and for 3 months after the last dose. What side effects may I notice from receiving this medication? Side effects that you should report to your care team as soon as possible: Allergic reactions--skin rash, itching, hives, swelling of the face, lips, tongue, or throat Dry cough, shortness of breath or trouble breathing Eye pain, redness, irritation, or discharge with blurry or decreased vision Heart muscle inflammation--unusual weakness or fatigue, shortness of breath, chest pain, fast or irregular heartbeat, dizziness, swelling of the ankles, feet, or hands Hormone gland problems--headache, sensitivity to light, unusual weakness or fatigue, dizziness, fast or irregular heartbeat, increased sensitivity to cold or heat, excessive sweating, constipation, hair loss, increased thirst or amount of urine, tremors or shaking, irritability Infusion reactions--chest pain, shortness of breath or trouble breathing, feeling faint or lightheaded Kidney injury (glomerulonephritis)--decrease in the amount of urine, red or dark brown urine, foamy or bubbly urine, swelling of the ankles, hands, or feet Liver injury--right upper belly pain, loss of appetite, nausea, light-colored stool, dark yellow or brown urine, yellowing skin or eyes, unusual weakness or fatigue Pain,  tingling, or numbness in the hands or feet, muscle weakness, change in vision, confusion or trouble speaking, loss of balance or coordination, trouble walking, seizures Rash, fever, and swollen lymph nodes Redness, blistering, peeling, or loosening of the skin, including inside the mouth Sudden or severe stomach pain, bloody diarrhea, fever, nausea, vomiting Side effects that usually do not require medical attention (report these  to your care team if they continue or are bothersome): Bone, joint, or muscle pain Diarrhea Fatigue Loss of appetite Nausea Skin rash This list may not describe all possible side effects. Call your doctor for medical advice about side effects. You may report side effects to FDA at 1-800-FDA-1088. Where should I keep my medication? This medication is given in a hospital or clinic. It will not be stored at home. NOTE: This sheet is a summary. It may not cover all possible information. If you have questions about this medicine, talk to your doctor, pharmacist, or health care provider.  2023 Elsevier/Gold Standard (2021-10-06 00:00:00)  Carboplatin Injection What is this medication? CARBOPLATIN (KAR boe pla tin) treats some types of cancer. It works by slowing down the growth of cancer cells. This medicine may be used for other purposes; ask your health care provider or pharmacist if you have questions. COMMON BRAND NAME(S): Paraplatin What should I tell my care team before I take this medication? They need to know if you have any of these conditions: Blood disorders Hearing problems Kidney disease Recent or ongoing radiation therapy An unusual or allergic reaction to carboplatin, cisplatin, other medications, foods, dyes, or preservatives Pregnant or trying to get pregnant Breast-feeding How should I use this medication? This medication is injected into a vein. It is given by your care team in a hospital or clinic setting. Talk to your care team about the use of this medication in children. Special care may be needed. Overdosage: If you think you have taken too much of this medicine contact a poison control center or emergency room at once. NOTE: This medicine is only for you. Do not share this medicine with others. What if I miss a dose? Keep appointments for follow-up doses. It is important not to miss your dose. Call your care team if you are unable to keep an appointment. What may  interact with this medication? Medications for seizures Some antibiotics, such as amikacin, gentamicin, neomycin, streptomycin, tobramycin Vaccines This list may not describe all possible interactions. Give your health care provider a list of all the medicines, herbs, non-prescription drugs, or dietary supplements you use. Also tell them if you smoke, drink alcohol, or use illegal drugs. Some items may interact with your medicine. What should I watch for while using this medication? Your condition will be monitored carefully while you are receiving this medication. You may need blood work while taking this medication. This medication may make you feel generally unwell. This is not uncommon, as chemotherapy can affect healthy cells as well as cancer cells. Report any side effects. Continue your course of treatment even though you feel ill unless your care team tells you to stop. In some cases, you may be given additional medications to help with side effects. Follow all directions for their use. This medication may increase your risk of getting an infection. Call your care team for advice if you get a fever, chills, sore throat, or other symptoms of a cold or flu. Do not treat yourself. Try to avoid being around people who are sick. Avoid taking medications that contain aspirin,  acetaminophen, ibuprofen, naproxen, or ketoprofen unless instructed by your care team. These medications may hide a fever. Be careful brushing or flossing your teeth or using a toothpick because you may get an infection or bleed more easily. If you have any dental work done, tell your dentist you are receiving this medication. Talk to your care team if you wish to become pregnant or think you might be pregnant. This medication can cause serious birth defects. Talk to your care team about effective forms of contraception. Do not breast-feed while taking this medication. What side effects may I notice from receiving this  medication? Side effects that you should report to your care team as soon as possible: Allergic reactions--skin rash, itching, hives, swelling of the face, lips, tongue, or throat Infection--fever, chills, cough, sore throat, wounds that don't heal, pain or trouble when passing urine, general feeling of discomfort or being unwell Low red blood cell level--unusual weakness or fatigue, dizziness, headache, trouble breathing Pain, tingling, or numbness in the hands or feet, muscle weakness, change in vision, confusion or trouble speaking, loss of balance or coordination, trouble walking, seizures Unusual bruising or bleeding Side effects that usually do not require medical attention (report to your care team if they continue or are bothersome): Hair loss Nausea Unusual weakness or fatigue Vomiting This list may not describe all possible side effects. Call your doctor for medical advice about side effects. You may report side effects to FDA at 1-800-FDA-1088. Where should I keep my medication? This medication is given in a hospital or clinic. It will not be stored at home. NOTE: This sheet is a summary. It may not cover all possible information. If you have questions about this medicine, talk to your doctor, pharmacist, or health care provider.  2023 Elsevier/Gold Standard (2021-09-29 00:00:00)  Etoposide Injection What is this medication? ETOPOSIDE (e toe POE side) treats some types of cancer. It works by slowing down the growth of cancer cells. This medicine may be used for other purposes; ask your health care provider or pharmacist if you have questions. COMMON BRAND NAME(S): Etopophos, Toposar, VePesid What should I tell my care team before I take this medication? They need to know if you have any of these conditions: Infection Kidney disease Liver disease Low blood counts, such as low white cell, platelet, red cell counts An unusual or allergic reaction to etoposide, other medications,  foods, dyes, or preservatives If you or your partner are pregnant or trying to get pregnant Breastfeeding How should I use this medication? This medication is injected into a vein. It is given by your care team in a hospital or clinic setting. Talk to your care team about the use of this medication in children. Special care may be needed. Overdosage: If you think you have taken too much of this medicine contact a poison control center or emergency room at once. NOTE: This medicine is only for you. Do not share this medicine with others. What if I miss a dose? Keep appointments for follow-up doses. It is important not to miss your dose. Call your care team if you are unable to keep an appointment. What may interact with this medication? Warfarin This list may not describe all possible interactions. Give your health care provider a list of all the medicines, herbs, non-prescription drugs, or dietary supplements you use. Also tell them if you smoke, drink alcohol, or use illegal drugs. Some items may interact with your medicine. What should I watch for while using this medication?  Your condition will be monitored carefully while you are receiving this medication. This medication may make you feel generally unwell. This is not uncommon as chemotherapy can affect healthy cells as well as cancer cells. Report any side effects. Continue your course of treatment even though you feel ill unless your care team tells you to stop. This medication can cause serious side effects. To reduce the risk, your care team may give you other medications to take before receiving this one. Be sure to follow the directions from your care team. This medication may increase your risk of getting an infection. Call your care team for advice if you get a fever, chills, sore throat, or other symptoms of a cold or flu. Do not treat yourself. Try to avoid being around people who are sick. This medication may increase your risk to  bruise or bleed. Call your care team if you notice any unusual bleeding. Talk to your care team about your risk of cancer. You may be more at risk for certain types of cancers if you take this medication. Talk to your care team if you may be pregnant. Serious birth defects can occur if you take this medication during pregnancy and for 6 months after the last dose. You will need a negative pregnancy test before starting this medication. Contraception is recommended while taking this medication and for 6 months after the last dose. Your care team can help you find the option that works for you. If your partner can get pregnant, use a condom during sex while taking this medication and for 4 months after the last dose. Do not breastfeed while taking this medication. This medication may cause infertility. Talk to your care team if you are concerned about your fertility. What side effects may I notice from receiving this medication? Side effects that you should report to your care team as soon as possible: Allergic reactions--skin rash, itching, hives, swelling of the face, lips, tongue, or throat Infection--fever, chills, cough, sore throat, wounds that don't heal, pain or trouble when passing urine, general feeling of discomfort or being unwell Low red blood cell level--unusual weakness or fatigue, dizziness, headache, trouble breathing Unusual bruising or bleeding Side effects that usually do not require medical attention (report to your care team if they continue or are bothersome): Diarrhea Fatigue Hair loss Loss of appetite Nausea Vomiting This list may not describe all possible side effects. Call your doctor for medical advice about side effects. You may report side effects to FDA at 1-800-FDA-1088. Where should I keep my medication? This medication is given in a hospital or clinic. It will not be stored at home. NOTE: This sheet is a summary. It may not cover all possible information. If you  have questions about this medicine, talk to your doctor, pharmacist, or health care provider.  2023 Elsevier/Gold Standard (2007-08-06 00:00:00)

## 2022-08-11 NOTE — H&P (Signed)
Chief Complaint: Patient was seen in consultation today for port placement   Referring Physician(s): Mohamed,Mohamed  Supervising Physician: Corrie Mckusick  Patient Status: Larry Kent - Out-pt  History of Present Illness: Larry Kent is a 77 y.o. male with a newly diagnosed metastatic neuroendocrine carcinoma of suspicious prostate primary versus primary lung cancer. He is to begin chemotherapy and is referred for port placement. PMHx, meds, labs, imaging, allergies reviewed. Feels well, no recent fevers, chills, illness. Has been NPO today as directed. Family at bedside    Past Medical History:  Diagnosis Date   Allergy    Arthritis    both knees    BCC (basal cell carcinoma), arm, right 09/2019   MOHS ([place])   BCC (basal cell carcinoma), face 2014   L preauricular s/p MOHS   BCC (basal cell carcinoma), face 02/2018   L upper lip   BPH (benign prostatic hypertrophy)    Dr. Junious Silk @ [facility]   Carotid stenosis 62/8366   RICA 29-47%, LICA 6-54%, L vertebral occlusion, rpt 1 yr   Cataract    removed both eyes    FHx: colon cancer    FHx: prostate cancer    Heart murmur    mild aortic stenosis    History of kidney stones    Hyperlipidemia    borderline- on rosuvastatin now normal    Occlusion of right vertebral artery 05/2015    Past Surgical History:  Procedure Laterality Date   BASAL CELL CARCINOMA EXCISION  05/2018   lip, 2020 x3  basal cells removed    CATARACT EXTRACTION  2008   Left   CATARACT EXTRACTION  2013   Right ([place])   COLONOSCOPY  ~2010   medium int hemorrhoids, o/w WNL, rpt 5 yrs given fmhx (Dr. Oletta Lamas)   COLONOSCOPY  08/2019   TAs, diverticulosis, rpt 3 yrs ([place])   ENDARTERECTOMY Right 12/01/2019   Procedure: RIGHT ENDARTERECTOMY CAROTID;  Surgeon: [name], MD;  Location: [place];  Service: Vascular;  Laterality: Right;   exercise treadmill  11/2005   WNL [place])   FINE NEEDLE ASPIRATION  07/28/2022   Procedure:  FINE NEEDLE ASPIRATION (FNA) LINEAR;  Surgeon: Garner Nash, DO;  Location: [facility];  Service: Pulmonary;;   HERNIA REPAIR  2008   Right   MOHS SURGERY Left spring 2014   basal cell face   PATCH ANGIOPLASTY Right 12/01/2019   Procedure: [place];  Surgeon: [name], MD;  Location: [place];  Service: Vascular;  Laterality: Right;   Testicular Biopsy  2003   benign, varicocele   US ECHOCARDIOGRAPHY  11/2005   aortic sclerosis, EF 65-03%, diastolic dysfunction   VIDEO BRONCHOSCOPY WITH ENDOBRONCHIAL ULTRASOUND Bilateral 07/28/2022   Procedure: VIDEO BRONCHOSCOPY WITH ENDOBRONCHIAL ULTRASOUND;  Surgeon: Garner Nash, DO;  Location: [facility];  Service: Pulmonary;  Laterality: Bilateral;    Allergies: Patient has no known allergies.  Medications: Prior to Admission medications   Medication Sig Start Date End Date Taking? Authorizing Provider  acetaminophen (TYLENOL) 500 MG tablet Take 500 mg by mouth every 6 (six) hours as needed for moderate pain.    [provider]  aspirin EC 81 MG tablet Take 1 tablet (81 mg total) by mouth daily. 08/02/18   Wellington Hampshire, MD  finasteride (PROSCAR) 5 MG tablet Take 5 mg by mouth daily. 07/22/22   [provider]  fluticasone (FLONASE) 50 MCG/ACT nasal spray Place 1 spray into both nostrils  daily as needed.    [provider]  ibuprofen (ADVIL) 200 MG tablet Take 400 mg by mouth every 6 (six) hours as needed for moderate pain.    [provider]  lidocaine-prilocaine (EMLA) cream Apply to the Port-A-Cath site 30-60 minutes before chemotherapy treatment 08/03/22   Curt Bears, MD  Multiple Vitamin (MULTIVITAMIN) tablet Take 1 tablet by mouth daily.    [provider]  prochlorperazine (COMPAZINE) 10 MG tablet Take 1 tablet (10 mg total) by mouth every 6 (six) hours as needed for nausea or vomiting. 08/03/22   Curt Bears, MD  rosuvastatin  (CRESTOR) 20 MG tablet TAKE 1 TABLET BY MOUTH EVERY DAY 04/28/22   Ria Bush, MD  tamsulosin (FLOMAX) 0.4 MG CAPS capsule Take 0.4 mg by mouth 2 (two) times daily. 11/16/18   Ria Bush, MD  Zinc 25 MG TABS Take 25 mg by mouth daily.    [provider]     Family History  Problem Relation Age of Onset   Cancer Father 42       prostate   Coronary artery disease Father 55       MI   Prostate cancer Father    Cancer Paternal Grandfather 2       prostate   Prostate cancer Paternal Grandfather    Stroke Mother    Hypertension Mother    Cancer Mother 31       colon   Colon cancer Mother    Coronary artery disease Maternal Uncle    Cancer Sister 75       breast   Breast cancer Sister    Colon cancer Maternal Grandmother    Diabetes Neg Hx    Colon polyps Neg Hx    Esophageal cancer Neg Hx    Rectal cancer Neg Hx    Stomach cancer Neg Hx     Social History   Socioeconomic History   Marital status: Married    Spouse name: Not on file   Number of children: Not on file   Years of education: Not on file   Highest education level: Not on file  Occupational History   Not on file  Tobacco Use   Smoking status: Never   Smokeless tobacco: Never  Vaping Use   Vaping Use: Never used  Substance and Sexual Activity   Alcohol use: Yes    Alcohol/week: 0.0 standard drinks of alcohol    Comment: rare   Drug use: No   Sexual activity: Not on file  Other Topics Concern   Not on file  Social History Narrative   Caffeine: 2-3 cups coffee/day   Lives with wife and adult son, 1 dog   Occupation: Counsellor   Edu: college   Activity: golf, no regular exercise, occasionally walks with wife   Diet: good amt water, daily fruits/vegetables, fish several times a week   Social Determinants of Health   Financial Resource Strain: Low Risk  (07/14/2019)   Overall Financial Resource Strain (CARDIA)    Difficulty of Paying Living Expenses: Not hard at all   Food Insecurity: No Food Insecurity (12/05/2019)   Hunger Vital Sign    Worried About Running Out of Food in the Last Year: Never true    [place] in the Last Year: Never true  Transportation Needs: No Transportation Needs (12/05/2019)   PRAPARE - Hydrologist (Medical): No    Lack of Transportation (Non-Medical): No  Physical  Activity: Inactive (07/14/2019)   Exercise Vital Sign    Days of Exercise per Week: 0 days    Minutes of Exercise per Session: 0 min  Stress: No Stress Concern Present (07/14/2019)   [place]    Feeling of Stress : Not at all  Social Connections: Not on file    Review of Systems: A 12 point ROS discussed and pertinent positives are indicated in the HPI above.  All other systems are negative.  Review of Systems  Vital Signs: BP (!) 159/82   Pulse 67   Temp 97.9 F (36.6 C) (Oral)   Resp 18   Ht 5\' 8"  (1.727 m)   Wt 179 lb 14.3 oz (81.6 kg)   SpO2 98%   BMI 27.35 kg/m   Physical Exam Constitutional:      Appearance: He is not ill-appearing.  HENT:     Mouth/Throat:     Mouth: Mucous membranes are moist.     Pharynx: Oropharynx is clear.  Cardiovascular:     Rate and Rhythm: Normal rate and regular rhythm.     Heart sounds: Normal heart sounds.  Pulmonary:     Effort: Pulmonary effort is normal. No respiratory distress.     Breath sounds: Normal breath sounds.  Skin:    General: Skin is warm and dry.  Neurological:     General: No focal deficit present.     Mental Status: He is alert and oriented to person, place, and time.  Psychiatric:        Mood and Affect: Mood normal.        Thought Content: Thought content normal.        Judgment: Judgment normal.     Imaging: MR BRAIN W WO CONTRAST  Result Date: 08/10/2022 CLINICAL DATA:  Non-small-cell lung cancer, staging. EXAM: MRI HEAD WITHOUT AND WITH CONTRAST TECHNIQUE: Multiplanar,  multiecho pulse sequences of the brain and surrounding structures were obtained without and with intravenous contrast. CONTRAST:  57mL GADAVIST GADOBUTROL 1 MMOL/ML IV SOLN COMPARISON:  MRI brain 02/08/2020. FINDINGS: Brain: Unchanged encephalomalacia in the right middle frontal gyrus and right parietal lobe, favored to reflect old infarcts. No acute infarct or intracranial hemorrhage. Single microhemorrhage in the left basal ganglia. No mass or abnormal enhancement. No hydrocephalus or extra-axial collection. Vascular: Normal flow voids. Skull and upper cervical spine: Normal marrow signal and enhancement. Sinuses/Orbits: Unremarkable. Other: None. IMPRESSION: No evidence of metastatic disease in the brain. Electronically Signed   By: Emmit Alexanders M.D.   On: 08/10/2022 10:46   NM PET SUPER D CT  Result Date: 07/15/2022 CLINICAL DATA:  Multiple pulmonary nodules. * Tracking Code: BO * EXAM: CT CHEST WITHOUT CONTRAST TECHNIQUE: Multidetector CT imaging of the chest was performed using thin slice collimation for electromagnetic bronchoscopy planning purposes, without intravenous contrast. RADIATION DOSE REDUCTION: This exam was performed according to the departmental dose-optimization program which includes automated exposure control, adjustment of the mA and/or kV according to patient size and/or use of iterative reconstruction technique. COMPARISON:  PET-CT performed earlier today FINDINGS: Cardiovascular: Heart size is normal. No pericardial effusion. Aortic atherosclerosis and 3 vessel coronary artery calcifications. Mediastinum/Nodes: Thyroid gland, trachea and esophagus are unremarkable. No enlarged axillary lymph nodes. Subcarinal lymph node is enlarged measuring 1.8 cm and exhibits increased FDG uptake on PET scan from earlier today, image 28/2 the hilar lymph nodes are suboptimally evaluated due to lack of IV contrast. Lungs/Pleura: Trace left pleural effusion. No  airspace disease. Innumerable pulmonary  nodules are scattered throughout both lungs. Index lesions include: -dominant nodule is in the right middle lobe measuring 2.4 cm, image 88/4. -posterior left upper lobe lung nodule measures 1.4 cm, image 59/4. -central right upper lobe lung nodule measures 1.1 cm, image 39/4. -nodule at the anterior left lung base measures 2 cm, image 112/4. -posteromedial right lower lobe lung nodule measures 1.1 cm, image 106/4. Upper Abdomen: No acute abnormality. Musculoskeletal: Lucent lesion within the posterior right second rib adjacent to the costovertebral junction measures 1 cm. Increased tracer uptake is identified within this area on the PET-CT from earlier today, image 5/2. IMPRESSION: 1. Innumerable pulmonary nodules are scattered throughout both lungs compatible with metastatic disease. The dominant nodule is in the right middle lobe measuring 2.4 cm. 2. Enlarged subcarinal lymph node exhibits increased FDG uptake on PET scan from earlier today compatible with metastatic adenopathy. 3. Lucent lesion within the posterior right second rib adjacent to the costovertebral junction measures 1 cm. Increased tracer uptake is identified within this area on the PET-CT from earlier today. Findings are concerning for metastatic disease. 4. Trace left pleural effusion. 5. Coronary artery calcifications. 6.  Aortic Atherosclerosis (ICD10-I70.0). Electronically Signed   By: Kerby Moors M.D.   On: 07/15/2022 12:49   NM PET Image Initial (PI) Skull Base To Thigh  Result Date: 07/15/2022 CLINICAL DATA:  Initial treatment strategy for lung nodules. EXAM: NUCLEAR MEDICINE PET SKULL BASE TO THIGH TECHNIQUE: 9.18 mCi F-18 FDG was injected intravenously. Full-ring PET imaging was performed from the skull base to thigh after the radiotracer. CT data was obtained and used for attenuation correction and anatomic localization. Fasting blood glucose: 111 mg/dl COMPARISON:  CT AP 07/09/2022 FINDINGS: Mediastinal blood pool activity: SUV  max 2.37 Liver activity: SUV max NA NECK: No hypermetabolic lymph nodes in the neck. Incidental CT findings: None. CHEST: There are innumerable, tracer avid pulmonary nodules scattered throughout both lungs, including: -left upper lobe lung nodule measures 1.4 cm with SUV max of 6.13, image 29/9. -right upper lobe lung nodule measures 1.2 cm with SUV max 4.48, image 21/9. -right middle lobe lung nodule measures 2.4 cm with SUV max of 13.2, image 43/9. Tracer avid subcarinal lymph node measures 1.6 cm with SUV max of 10.49, image 75/4. Tracer avid right hilar lymph node has an SUV max of 6.90, image 76/4 Incidental CT findings: Small left pleural effusion. Aortic atherosclerosis and coronary artery calcifications. ABDOMEN/PELVIS: Tracer avid right external iliac lymph node measures 1.2 cm with SUV max of 8.57, image 161/4. The prostate gland is enlarged. There is asymmetric increased uptake along the posterior and left side of the gland with SUV max 7.90. Incidental CT findings: Aortic atherosclerosis. Nonobstructing right renal calculi. MULTIFOCAL TRACER AVID BONE METASTASES ARE NOTED, INCLUDING: MULTIFOCAL TRACER AVID BONE METASTASES ARE NOTED, INCLUDING -Mildly sclerotic lesion within the left inferior pubic rami has an SUV max of 9.47, image 196/4 -Mildly sclerotic lesion within the right lower sacrum has an SUV max of 6.29, image 170/4. -Tracer avid lesion involving the right posterior T12 vertebral body (without significant CT changes) has an SUV max 5.90, image 111/4. Incidental CT findings: None. IMPRESSION: 1. There are innumerable tracer avid pulmonary nodules scattered throughout both lungs compatible with metastatic disease. 2. FDG avid right hilar, subcarinal, and right external iliac lymph nodes compatible with nodal metastasis. 3. Multifocal tracer avid bone metastases. 4. Enlarged prostate gland with asymmetric increased uptake along the posterior and left side of the  gland. Cannot exclude underlying  prostate gland malignancy. 5.  Aortic Atherosclerosis (ICD10-I70.0). Electronically Signed   By: Kerby Moors M.D.   On: 07/15/2022 10:31    Labs:  CBC: Recent Labs    07/28/22 1312 08/03/22 1352  WBC  --  5.9  HGB 13.6 12.7*  HCT 40.0 36.8*  PLT  --  191    COAGS: No results for input(s): "INR", "APTT" in the last 8760 hours.  BMP: Recent Labs    09/23/21 0905 07/28/22 1312 08/03/22 1352  NA 138 139 137  K 4.1 4.2 4.4  CL 103 103 105  CO2 29  --  27  GLUCOSE 101* 109* 162*  BUN 15 18 25*  CALCIUM 8.9  --  9.1  CREATININE 1.03 0.90 0.99  GFRNONAA  --   --  >60    LIVER FUNCTION TESTS: Recent Labs    09/23/21 0905 08/03/22 1352  BILITOT 0.9 0.5  AST 21 27  ALT 16 23  ALKPHOS 62 109  PROT 6.9 6.4*  ALBUMIN 4.1 3.8     Assessment and Plan: Newly diagnosed metastatic neuroendocrine carcinoma, suspicious for prostate primary versus primary lung cancer For port placement. Risks and benefits of image guided port-a-catheter placement was discussed with the patient including, but not limited to bleeding, infection, pneumothorax, or fibrin sheath development and need for additional procedures.  All of the patient's questions were answered, patient is agreeable to proceed. Consent signed and in chart.    Electronically Signed: Ascencion Dike, PA-C 08/11/2022, 9:05 AM   I spent a total of 20 minutes in face to face in clinical consultation, greater than 50% of which was counseling/coordinating care for port

## 2022-08-11 NOTE — Procedures (Signed)
Interventional Radiology Procedure Note  Procedure: Placement of a right IJ approach single lumen PowerPort.  Tip is positioned at the superior cavoatrial junction and catheter is ready for immediate use.  Complications: None Recommendations:  - Ok to shower tomorrow - Do not submerge for 7 days - Routine line care  - access remains for immediate appt with [facility]  Signed,  Dulcy Fanny. Earleen Newport, DO

## 2022-08-11 NOTE — Discharge Instructions (Signed)
Discharge Instructions:   Please call Interventional Radiology clinic [phone] with any questions or concerns.  You may remove your dressing and shower tomorrow.  Do not use EMLA / Lidocaine cream for 2 weeks post Port Insertion this will remove the surgical glue.  Moderate Conscious Sedation, Adult, Care After This sheet gives you information about how to care for yourself after your procedure. Your health care provider may also give you more specific instructions. If you have problems or questions, contact your health care provider. What can I expect after the procedure? After the procedure, it is common to have: Sleepiness for several hours. Impaired judgment for several hours. Difficulty with balance. Vomiting if you eat too soon. Follow these instructions at home: For the time period you were told by your health care provider: Rest. Do not participate in activities where you could fall or become injured. Do not drive or use machinery. Do not drink alcohol. Do not take sleeping pills or medicines that cause drowsiness. Do not make important decisions or sign legal documents. Do not take care of children on your own. Eating and drinking  Follow the diet recommended by your health care provider. Drink enough fluid to keep your urine pale yellow. If you vomit: Drink water, juice, or soup when you can drink without vomiting. Make sure you have little or no nausea before eating solid foods. General instructions Take over-the-counter and prescription medicines only as told by your health care provider. Have a responsible adult stay with you for the time you are told. It is important to have someone help care for you until you are awake and alert. Do not smoke. Keep all follow-up visits as told by your health care provider. This is important. Contact a health care provider if: You are still sleepy or having trouble with balance after 24 hours. You feel light-headed. You keep  feeling nauseous or you keep vomiting. You develop a rash. You have a fever. You have redness or swelling around the IV site. Get help right away if: You have trouble breathing. You have new-onset confusion at home. Summary After the procedure, it is common to feel sleepy, have impaired judgment, or feel nauseous if you eat too soon. Rest after you get home. Know the things you should not do after the procedure. Follow the diet recommended by your health care provider and drink enough fluid to keep your urine pale yellow. Get help right away if you have trouble breathing or new-onset confusion at home. This information is not intended to replace advice given to you by your health care provider. Make sure you discuss any questions you have with your health care provider. Document Revised: 10/13/2019 Document Reviewed: 05/11/2019 Elsevier Patient Education  [place] Insertion, Care After The following information offers guidance on how to care for yourself after your procedure. Your health care provider may also give you more specific instructions. If you have problems or questions, contact your health care provider. What can I expect after the procedure? After the procedure, it is common to have: Discomfort at the port insertion site. Bruising on the skin over the port. This should improve over 3-4 days. Follow these instructions at home: [facility] care After your port is placed, you will get a manufacturer's information card. The card has information about your port. Keep this card with you at all times. Take care of the port as told by your health care provider. Ask your health care provider if you or  a family member can get training for taking care of the port at home. A home health care nurse will be be available to help care for the port. Make sure to remember what type of port you have. Incision care     Follow instructions from your health care provider  about how to take care of your port insertion site. Make sure you: Wash your hands with soap and water for at least 20 seconds before and after you change your bandage (dressing). If soap and water are not available, use hand sanitizer. Change your dressing as told by your health care provider. Leave stitches (sutures), skin glue, or adhesive strips in place. These skin closures may need to stay in place for 2 weeks or longer. If adhesive strip edges start to loosen and curl up, you may trim the loose edges. Do not remove adhesive strips completely unless your health care provider tells you to do that. Check your port insertion site every day for signs of infection. Check for: Redness, swelling, or pain. Fluid or blood. Warmth. Pus or a bad smell. Activity Return to your normal activities as told by your health care provider. Ask your health care provider what activities are safe for you. You may have to avoid lifting. Ask your health care provider how much you can safely lift. General instructions Take over-the-counter and prescription medicines only as told by your health care provider. Do not take baths, swim, or use a hot tub until your health care provider approves. Ask your health care provider if you may take showers. You may only be allowed to take sponge baths. If you were given a sedative during the procedure, it can affect you for several hours. Do not drive or operate machinery until your health care provider says that it is safe. Wear a medical alert bracelet in case of an emergency. This will tell any health care providers that you have a port. Keep all follow-up visits. This is important. Contact a health care provider if: You cannot flush your port with saline as directed, or you cannot draw blood from the port. You have a fever or chills. You have redness, swelling, or pain around your port insertion site. You have fluid or blood coming from your port insertion site. Your port  insertion site feels warm to the touch. You have pus or a bad smell coming from the port insertion site. Get help right away if: You have chest pain or shortness of breath. You have bleeding from your port that you cannot control. These symptoms may be an emergency. Get help right away. Call 911. Do not wait to see if the symptoms will go away. Do not drive yourself to the hospital. Summary Take care of the port as told by your health care provider. Keep the manufacturer's information card with you at all times. Change your dressing as told by your health care provider. Contact a health care provider if you have a fever or chills or if you have redness, swelling, or pain around your port insertion site. Keep all follow-up visits. This information is not intended to replace advice given to you by your health care provider. Make sure you discuss any questions you have with your health care provider. Document Revised: 12/17/2020 Document Reviewed: 12/17/2020 Elsevier Patient Education  [place].

## 2022-08-12 ENCOUNTER — Ambulatory Visit: Payer: Medicare Other

## 2022-08-12 ENCOUNTER — Inpatient Hospital Stay: Payer: Medicare Other

## 2022-08-12 ENCOUNTER — Other Ambulatory Visit: Payer: Self-pay

## 2022-08-12 VITALS — BP 127/66 | HR 77 | Temp 98.1°F | Resp 17

## 2022-08-12 DIAGNOSIS — C7951 Secondary malignant neoplasm of bone: Secondary | ICD-10-CM | POA: Diagnosis not present

## 2022-08-12 DIAGNOSIS — C7A1 Malignant poorly differentiated neuroendocrine tumors: Secondary | ICD-10-CM

## 2022-08-12 DIAGNOSIS — Z79899 Other long term (current) drug therapy: Secondary | ICD-10-CM | POA: Diagnosis not present

## 2022-08-12 DIAGNOSIS — R918 Other nonspecific abnormal finding of lung field: Secondary | ICD-10-CM | POA: Diagnosis not present

## 2022-08-12 DIAGNOSIS — Z5111 Encounter for antineoplastic chemotherapy: Secondary | ICD-10-CM | POA: Diagnosis not present

## 2022-08-12 DIAGNOSIS — R590 Localized enlarged lymph nodes: Secondary | ICD-10-CM | POA: Diagnosis not present

## 2022-08-12 LAB — PSA, TOTAL AND FREE
PSA, Free Pct: 34.4 %
PSA, Free: 5.6 ng/mL
Prostate Specific Ag, Serum: 16.3 ng/mL — ABNORMAL HIGH (ref 0.0–4.0)

## 2022-08-12 LAB — T4: T4, Total: 6.7 ug/dL (ref 4.5–12.0)

## 2022-08-12 MED ORDER — SODIUM CHLORIDE 0.9% FLUSH
10.0000 mL | INTRAVENOUS | Status: DC | PRN
  Administered 2022-08-12: 10 mL

## 2022-08-12 MED ORDER — SODIUM CHLORIDE 0.9 % IV SOLN
100.0000 mg/m2 | Freq: Once | INTRAVENOUS | Status: AC
  Administered 2022-08-12: 200 mg via INTRAVENOUS
  Filled 2022-08-12: qty 10

## 2022-08-12 MED ORDER — HEPARIN SOD (PORK) LOCK FLUSH 100 UNIT/ML IV SOLN
500.0000 [IU] | Freq: Once | INTRAVENOUS | Status: AC | PRN
  Administered 2022-08-12: 500 [IU]

## 2022-08-12 MED ORDER — SODIUM CHLORIDE 0.9 % IV SOLN: Freq: Once | INTRAVENOUS | Status: AC

## 2022-08-12 MED ORDER — SODIUM CHLORIDE 0.9 % IV SOLN
10.0000 mg | Freq: Once | INTRAVENOUS | Status: AC
  Administered 2022-08-12: 10 mg via INTRAVENOUS
  Filled 2022-08-12: qty 1

## 2022-08-12 MED FILL — Dexamethasone Sodium Phosphate Inj 100 MG/10ML: INTRAMUSCULAR | Qty: 1 | Status: AC

## 2022-08-12 NOTE — Patient Instructions (Signed)
[facility]  Discharge Instructions: Thank you for choosing [facility] to provide your oncology and hematology care.   If you have a lab appointment with the [facility], please go directly to the [facility] and check in at the registration area.   Wear comfortable clothing and clothing appropriate for easy access to any Portacath or PICC line.   We strive to give you quality time with your provider. You may need to reschedule your appointment if you arrive late (15 or more minutes).  Arriving late affects you and other patients whose appointments are after yours.  Also, if you miss three or more appointments without notifying the office, you may be dismissed from the clinic at the provider's discretion.      For prescription refill requests, have your pharmacy contact our office and allow 72 hours for refills to be completed.    Today you received the following chemotherapy and/or immunotherapy agents; Etoposide      To help prevent nausea and vomiting after your treatment, we encourage you to take your nausea medication as directed.  BELOW ARE SYMPTOMS THAT SHOULD BE REPORTED IMMEDIATELY: *FEVER GREATER THAN 100.4 F (38 C) OR HIGHER *CHILLS OR SWEATING *NAUSEA AND VOMITING THAT IS NOT CONTROLLED WITH YOUR NAUSEA MEDICATION *UNUSUAL SHORTNESS OF BREATH *UNUSUAL BRUISING OR BLEEDING *URINARY PROBLEMS (pain or burning when urinating, or frequent urination) *BOWEL PROBLEMS (unusual diarrhea, constipation, pain near the anus) TENDERNESS IN MOUTH AND THROAT WITH OR WITHOUT PRESENCE OF ULCERS (sore throat, sores in mouth, or a toothache) UNUSUAL RASH, SWELLING OR PAIN  UNUSUAL VAGINAL DISCHARGE OR ITCHING   Items with * indicate a potential emergency and should be followed up as soon as possible or go to the Emergency Department if any problems should occur.  Please show the CHEMOTHERAPY ALERT CARD or IMMUNOTHERAPY ALERT CARD at  check-in to the Emergency Department and triage nurse.  Should you have questions after your visit or need to cancel or reschedule your appointment, please contact [facility]  Dept: [phone]  and follow the prompts.  Office hours are 8:00 a.m. to 4:30 p.m. Monday - Friday. Please note that voicemails left after 4:00 p.m. may not be returned until the following business day.  We are closed weekends and major holidays. You have access to a nurse at all times for urgent questions. Please call the main number to the clinic Dept: [phone] and follow the prompts.   For any non-urgent questions, you may also contact your provider using MyChart. We now offer e-Visits for anyone 92 and older to request care online for non-urgent symptoms. For details visit [email or web address].   Also download the MyChart app! Go to the app store, search "MyChart", open the app, select , and log in with your MyChart username and password.

## 2022-08-13 ENCOUNTER — Inpatient Hospital Stay: Payer: Medicare Other

## 2022-08-13 VITALS — BP 154/77 | HR 64 | Temp 97.7°F | Resp 16

## 2022-08-13 DIAGNOSIS — Z79899 Other long term (current) drug therapy: Secondary | ICD-10-CM | POA: Diagnosis not present

## 2022-08-13 DIAGNOSIS — R918 Other nonspecific abnormal finding of lung field: Secondary | ICD-10-CM | POA: Diagnosis not present

## 2022-08-13 DIAGNOSIS — Z5111 Encounter for antineoplastic chemotherapy: Secondary | ICD-10-CM | POA: Diagnosis not present

## 2022-08-13 DIAGNOSIS — R590 Localized enlarged lymph nodes: Secondary | ICD-10-CM | POA: Diagnosis not present

## 2022-08-13 DIAGNOSIS — C7951 Secondary malignant neoplasm of bone: Secondary | ICD-10-CM | POA: Diagnosis not present

## 2022-08-13 DIAGNOSIS — C7A1 Malignant poorly differentiated neuroendocrine tumors: Secondary | ICD-10-CM

## 2022-08-13 MED ORDER — SODIUM CHLORIDE 0.9% FLUSH
10.0000 mL | INTRAVENOUS | Status: DC | PRN
  Administered 2022-08-13: 10 mL

## 2022-08-13 MED ORDER — HEPARIN SOD (PORK) LOCK FLUSH 100 UNIT/ML IV SOLN
500.0000 [IU] | Freq: Once | INTRAVENOUS | Status: AC | PRN
  Administered 2022-08-13: 500 [IU]

## 2022-08-13 MED ORDER — SODIUM CHLORIDE 0.9 % IV SOLN
10.0000 mg | Freq: Once | INTRAVENOUS | Status: AC
  Administered 2022-08-13: 10 mg via INTRAVENOUS
  Filled 2022-08-13: qty 10

## 2022-08-13 MED ORDER — SODIUM CHLORIDE 0.9 % IV SOLN: Freq: Once | INTRAVENOUS | Status: AC

## 2022-08-13 MED ORDER — SODIUM CHLORIDE 0.9 % IV SOLN
100.0000 mg/m2 | Freq: Once | INTRAVENOUS | Status: AC
  Administered 2022-08-13: 200 mg via INTRAVENOUS
  Filled 2022-08-13: qty 10

## 2022-08-13 NOTE — Patient Instructions (Addendum)
[facility]  Discharge Instructions: Thank you for choosing [facility] to provide your oncology and hematology care.   If you have a lab appointment with the [facility], please go directly to the [facility] and check in at the registration area.   Wear comfortable clothing and clothing appropriate for easy access to any Portacath or PICC line.   We strive to give you quality time with your provider. You may need to reschedule your appointment if you arrive late (15 or more minutes).  Arriving late affects you and other patients whose appointments are after yours.  Also, if you miss three or more appointments without notifying the office, you may be dismissed from the clinic at the provider's discretion.      For prescription refill requests, have your pharmacy contact our office and allow 72 hours for refills to be completed.    Today you received the following chemotherapy and/or immunotherapy agents; Etoposide      To help prevent nausea and vomiting after your treatment, we encourage you to take your nausea medication as directed.  BELOW ARE SYMPTOMS THAT SHOULD BE REPORTED IMMEDIATELY: *FEVER GREATER THAN 100.4 F (38 C) OR HIGHER *CHILLS OR SWEATING *NAUSEA AND VOMITING THAT IS NOT CONTROLLED WITH YOUR NAUSEA MEDICATION *UNUSUAL SHORTNESS OF BREATH *UNUSUAL BRUISING OR BLEEDING *URINARY PROBLEMS (pain or burning when urinating, or frequent urination) *BOWEL PROBLEMS (unusual diarrhea, constipation, pain near the anus) TENDERNESS IN MOUTH AND THROAT WITH OR WITHOUT PRESENCE OF ULCERS (sore throat, sores in mouth, or a toothache) UNUSUAL RASH, SWELLING OR PAIN  UNUSUAL VAGINAL DISCHARGE OR ITCHING   Items with * indicate a potential emergency and should be followed up as soon as possible or go to the Emergency Department if any problems should occur.  Please show the CHEMOTHERAPY ALERT CARD or IMMUNOTHERAPY ALERT CARD at  check-in to the Emergency Department and triage nurse.  Should you have questions after your visit or need to cancel or reschedule your appointment, please contact [facility]  Dept: [phone]  and follow the prompts.  Office hours are 8:00 a.m. to 4:30 p.m. Monday - Friday. Please note that voicemails left after 4:00 p.m. may not be returned until the following business day.  We are closed weekends and major holidays. You have access to a nurse at all times for urgent questions. Please call the main number to the clinic Dept: [phone] and follow the prompts.   For any non-urgent questions, you may also contact your provider using MyChart. We now offer e-Visits for anyone 80 and older to request care online for non-urgent symptoms. For details visit [email or web address].   Also download the MyChart app! Go to the app store, search "MyChart", open the app, select [facility], and log in with your MyChart username and password.  Implanted Port Insertion, Care After The following information offers guidance on how to care for yourself after your procedure. Your health care provider may also give you more specific instructions. If you have problems or questions, contact your health care provider. What can I expect after the procedure? After the procedure, it is common to have: Discomfort at the port insertion site. Bruising on the skin over the port. This should improve over 3-4 days. Follow these instructions at home: [facility] care After your port is placed, you will get a manufacturer's information card. The card has information about your port. Keep this card with you  at all times. Take care of the port as told by your health care provider. Ask your health care provider if you or a family member can get training for taking care of the port at home. A home health care nurse will be be available to help care for the port. Make sure to remember what type of  port you have. Incision care     Follow instructions from your health care provider about how to take care of your port insertion site. Make sure you: Wash your hands with soap and water for at least 20 seconds before and after you change your bandage (dressing). If soap and water are not available, use hand sanitizer. Change your dressing as told by your health care provider. Leave stitches (sutures), skin glue, or adhesive strips in place. These skin closures may need to stay in place for 2 weeks or longer. If adhesive strip edges start to loosen and curl up, you may trim the loose edges. Do not remove adhesive strips completely unless your health care provider tells you to do that. Check your port insertion site every day for signs of infection. Check for: Redness, swelling, or pain. Fluid or blood. Warmth. Pus or a bad smell. Activity Return to your normal activities as told by your health care provider. Ask your health care provider what activities are safe for you. You may have to avoid lifting. Ask your health care provider how much you can safely lift. General instructions Take over-the-counter and prescription medicines only as told by your health care provider. Do not take baths, swim, or use a hot tub until your health care provider approves. Ask your health care provider if you may take showers. You may only be allowed to take sponge baths. If you were given a sedative during the procedure, it can affect you for several hours. Do not drive or operate machinery until your health care provider says that it is safe. Wear a medical alert bracelet in case of an emergency. This will tell any health care providers that you have a port. Keep all follow-up visits. This is important. Contact a health care provider if: You cannot flush your port with saline as directed, or you cannot draw blood from the port. You have a fever or chills. You have redness, swelling, or pain around your port  insertion site. You have fluid or blood coming from your port insertion site. Your port insertion site feels warm to the touch. You have pus or a bad smell coming from the port insertion site. Get help right away if: You have chest pain or shortness of breath. You have bleeding from your port that you cannot control. These symptoms may be an emergency. Get help right away. Call 911. Do not wait to see if the symptoms will go away. Do not drive yourself to the hospital. Summary Take care of the port as told by your health care provider. Keep the manufacturer's information card with you at all times. Change your dressing as told by your health care provider. Contact a health care provider if you have a fever or chills or if you have redness, swelling, or pain around your port insertion site. Keep all follow-up visits. This information is not intended to replace advice given to you by your health care provider. Make sure you discuss any questions you have with your health care provider. Document Revised: 12/17/2020 Document Reviewed: 12/17/2020 Elsevier Patient Education  [place].

## 2022-08-14 ENCOUNTER — Telehealth: Payer: Self-pay | Admitting: *Deleted

## 2022-08-14 NOTE — Telephone Encounter (Signed)
-----   Message from Jesse Fall, [place] sent at 08/12/2022  2:40 PM EST ----- Moved to 2/16  ----- Message ----- From: Alvera Singh, RN Sent: 08/11/2022   5:37 PM EST To: [facility] Triage Nurse [facility]  08/11/22 New Imfinzi, carboplatin, etoposide. Lung CA- Dr. Julien Nordmann. Tolerated well.

## 2022-08-14 NOTE — Telephone Encounter (Signed)
Called pt to see how he did with his chemo.  He reports some dizziness & light headedness & feeling jittery.  He is drinking plenty of fluids & voiding frequently & states good amt but does burn a little but this isn't new.  He denies any other symptoms & knows his appts & how to reach [place] if needed.  He will check his BP & call us if too high or too low or with worsening symptoms. Message routed to Dr Mohamed/Pod RN

## 2022-08-15 ENCOUNTER — Inpatient Hospital Stay: Payer: Medicare Other

## 2022-08-15 ENCOUNTER — Ambulatory Visit: Payer: Medicare Other

## 2022-08-15 VITALS — BP 140/82 | HR 60 | Temp 97.9°F | Resp 16

## 2022-08-15 DIAGNOSIS — Z79899 Other long term (current) drug therapy: Secondary | ICD-10-CM | POA: Diagnosis not present

## 2022-08-15 DIAGNOSIS — Z5111 Encounter for antineoplastic chemotherapy: Secondary | ICD-10-CM | POA: Diagnosis not present

## 2022-08-15 DIAGNOSIS — C7A1 Malignant poorly differentiated neuroendocrine tumors: Secondary | ICD-10-CM

## 2022-08-15 DIAGNOSIS — C7951 Secondary malignant neoplasm of bone: Secondary | ICD-10-CM | POA: Diagnosis not present

## 2022-08-15 DIAGNOSIS — R590 Localized enlarged lymph nodes: Secondary | ICD-10-CM | POA: Diagnosis not present

## 2022-08-15 DIAGNOSIS — R918 Other nonspecific abnormal finding of lung field: Secondary | ICD-10-CM | POA: Diagnosis not present

## 2022-08-15 MED ORDER — PEGFILGRASTIM-CBQV 6 MG/0.6ML ~~LOC~~ SOSY
6.0000 mg | PREFILLED_SYRINGE | Freq: Once | SUBCUTANEOUS | Status: AC
  Administered 2022-08-15: 6 mg via SUBCUTANEOUS

## 2022-08-15 NOTE — Progress Notes (Unsigned)
Larry Kent  Ria Bush, MD [place] [address]  DIAGNOSIS: metastatic poorly differentiated carcinoma, neuroendocrine carcinoma of suspicious prostate primary versus primary lung cancer presented with innumerable and bilateral pulmonary nodules in addition to right hilar and mediastinal lymphadenopathy and metastatic disease to several areas of the bone in addition to hypermetabolic activity in the prostate gland diagnosed in January 2024 ***check prostate biopsy results.   PRIOR THERAPY: None  CURRENT THERAPY:  Palliative systemic chemotherapy with carboplatin for AUC of 5 and Imfinzi 1500 Mg IV on day 1 as well as etoposide 100 Mg/M2 on days 1, 2 and 3 with Neulasta support on day 5. Status post 1 cycle. First dose on 08/10/22  INTERVAL HISTORY: Larry Kent 77 y.o. male returns to clinic today for follow-up visit.  The patient was last seen by Dr. Julien Nordmann on 08/03/2022.  In the interval since last being seen, the patient had a prostate biopsy performed by Dr. Junious Silk from [facility]rology and the pathology showed ***.  He also was evaluated by radiation oncology to consider palliative radiation to some of the metastatic disease to the pelvic area.  The patient opted to hold off on radiation at this time but can be reconsidered in the future if needed.  He also underwent his first cycle of systemic chemotherapy and immunotherapy and he tolerated his first cycle well except he did have some jitteriness and dizziness/lightheadedness ***days after following treatment which ***.  Today he denies any fever, chills, night sweats, or unexplained weight loss.  Can continues to have fatigue?  Frequent urination?  Denies any chest pain, shortness of breath, cough, or hemoptysis.  Denies any nausea, vomiting, diarrhea, or constipation.  Denies any headache or visual changes.  Of Kent the patient staging brain MRI was negative for metastatic  disease.  The patient denies any rashes or skin changes.  He is here today for evaluation and a 1 week follow-up visit to manage any adverse side effects of treatment  MEDICAL HISTORY: Past Medical History:  Diagnosis Date   Allergy    Arthritis    both knees    BCC (basal cell carcinoma), arm, right 09/2019   MOHS ([place])   BCC (basal cell carcinoma), face 2014   L preauricular s/p MOHS   BCC (basal cell carcinoma), face 02/2018   L upper lip   BPH (benign prostatic hypertrophy)    Dr. Junious Silk @ [facility]   Carotid stenosis 37/6283   RICA 15-17%, LICA 6-16%, L vertebral occlusion, rpt 1 yr   Cataract    removed both eyes    FHx: colon cancer    FHx: prostate cancer    Heart murmur    mild aortic stenosis    History of kidney stones    Hyperlipidemia    borderline- on rosuvastatin now normal    Occlusion of right vertebral artery 05/2015    ALLERGIES:  has No Known Allergies.  MEDICATIONS:  Current Outpatient Medications  Medication Sig Dispense Refill   acetaminophen (TYLENOL) 500 MG tablet Take 500 mg by mouth every 6 (six) hours as needed for moderate pain.     aspirin EC 81 MG tablet Take 1 tablet (81 mg total) by mouth daily. 90 tablet 3   finasteride (PROSCAR) 5 MG tablet Take 5 mg by mouth daily.     fluticasone (FLONASE) 50 MCG/ACT nasal spray Place 1 spray into both nostrils daily as needed.     ibuprofen (  ADVIL) 200 MG tablet Take 400 mg by mouth every 6 (six) hours as needed for moderate pain.     lidocaine-prilocaine (EMLA) cream Apply to the Port-A-Cath site 30-60 minutes before chemotherapy treatment 30 g 0   Multiple Vitamin (MULTIVITAMIN) tablet Take 1 tablet by mouth daily.     prochlorperazine (COMPAZINE) 10 MG tablet Take 1 tablet (10 mg total) by mouth every 6 (six) hours as needed for nausea or vomiting. 30 tablet 0   rosuvastatin (CRESTOR) 20 MG tablet TAKE 1 TABLET BY MOUTH EVERY DAY 90 tablet 1   tamsulosin (FLOMAX) 0.4 MG CAPS capsule Take 0.4 mg  by mouth 2 (two) times daily.     Zinc 25 MG TABS Take 25 mg by mouth daily.     No current facility-administered medications for this visit.    SURGICAL HISTORY:  Past Surgical History:  Procedure Laterality Date   BASAL CELL CARCINOMA EXCISION  05/2018   lip, 2020 x3  basal cells removed    CATARACT EXTRACTION  2008   Left   CATARACT EXTRACTION  2013   Right ([place])   COLONOSCOPY  ~2010   medium int hemorrhoids, o/w WNL, rpt 5 yrs given fmhx (Dr. Oletta Lamas)   COLONOSCOPY  08/2019   TAs, diverticulosis, rpt 3 yrs ([place])   ENDARTERECTOMY Right 12/01/2019   Procedure: RIGHT ENDARTERECTOMY CAROTID;  Surgeon: [name], MD;  Location: [facility];  Service: Vascular;  Laterality: Right;   exercise treadmill  11/2005   WNL [place])   FINE NEEDLE ASPIRATION  07/28/2022   Procedure: FINE NEEDLE ASPIRATION (FNA) LINEAR;  Surgeon: Garner Nash, DO;  Location: [facility];  Service: Pulmonary;;   HERNIA REPAIR  2008   Right   IR IMAGING GUIDED PORT INSERTION  08/11/2022   MOHS SURGERY Left spring 2014   basal cell face   PATCH ANGIOPLASTY Right 12/01/2019   Procedure: [place];  Surgeon: [name], MD;  Location: [facility];  Service: Vascular;  Laterality: Right;   Testicular Biopsy  2003   benign, varicocele   US ECHOCARDIOGRAPHY  11/2005   aortic sclerosis, EF 50-38%, diastolic dysfunction   VIDEO BRONCHOSCOPY WITH ENDOBRONCHIAL ULTRASOUND Bilateral 07/28/2022   Procedure: VIDEO BRONCHOSCOPY WITH ENDOBRONCHIAL ULTRASOUND;  Surgeon: Garner Nash, DO;  Location: [facility];  Service: Pulmonary;  Laterality: Bilateral;    REVIEW OF SYSTEMS:   Review of Systems  Constitutional: Negative for appetite change, chills, fatigue, fever and unexpected weight change.  HENT:   Negative for mouth sores, nosebleeds, sore throat and trouble swallowing.   Eyes: Negative for eye problems and icterus.  Respiratory: Negative for cough,  hemoptysis, shortness of breath and wheezing.   Cardiovascular: Negative for chest pain and leg swelling.  Gastrointestinal: Negative for abdominal pain, constipation, diarrhea, nausea and vomiting.  Genitourinary: Negative for bladder incontinence, difficulty urinating, dysuria, frequency and hematuria.   Musculoskeletal: Negative for back pain, gait problem, neck pain and neck stiffness.  Skin: Negative for itching and rash.  Neurological: Negative for dizziness, extremity weakness, gait problem, headaches, light-headedness and seizures.  Hematological: Negative for adenopathy. Does not bruise/bleed easily.  Psychiatric/Behavioral: Negative for confusion, depression and sleep disturbance. The patient is not nervous/anxious.     PHYSICAL EXAMINATION:  There were no vitals taken for this visit.  ECOG PERFORMANCE STATUS: {CHL ONC ECOG [id number]  Physical Exam  Constitutional: Oriented to person, place, and time and well-developed, well-nourished, and in no distress. No distress.  HENT:  Head: Normocephalic and atraumatic.  Mouth/Throat: Oropharynx is clear and moist. No oropharyngeal exudate.  Eyes: Conjunctivae are normal. Right eye exhibits no discharge. Left eye exhibits no discharge. No scleral icterus.  Neck: Normal range of motion. Neck supple.  Cardiovascular: Normal rate, regular rhythm, normal heart sounds and intact distal pulses.   Pulmonary/Chest: Effort normal and breath sounds normal. No respiratory distress. No wheezes. No rales.  Abdominal: Soft. Bowel sounds are normal. Exhibits no distension and no mass. There is no tenderness.  Musculoskeletal: Normal range of motion. Exhibits no edema.  Lymphadenopathy:    No cervical adenopathy.  Neurological: Alert and oriented to person, place, and time. Exhibits normal muscle tone. Gait normal. Coordination normal.  Skin: Skin is warm and dry. No rash noted. Not diaphoretic. No erythema. No pallor.  Psychiatric: Mood, memory  and judgment normal.  Vitals reviewed.  LABORATORY DATA: Lab Results  Component Value Date   WBC 3.7 (L) 08/11/2022   HGB 12.1 (L) 08/11/2022   HCT 34.6 (L) 08/11/2022   MCV 100.6 (H) 08/11/2022   PLT 155 08/11/2022      Chemistry      Component Value Date/Time   NA 137 08/11/2022 1200   K 3.8 08/11/2022 1200   CL 106 08/11/2022 1200   CO2 27 08/11/2022 1200   BUN 18 08/11/2022 1200   CREATININE 0.82 08/11/2022 1200      Component Value Date/Time   CALCIUM 8.9 08/11/2022 1200   ALKPHOS 113 08/11/2022 1200   AST 26 08/11/2022 1200   ALT 21 08/11/2022 1200   BILITOT 0.5 08/11/2022 1200       RADIOGRAPHIC STUDIES:  IR IMAGING GUIDED PORT INSERTION  Result Date: 08/11/2022 INDICATION: 77 year old male referred for port catheter EXAM: IMAGE GUIDED PORT CATHETER MEDICATIONS: None ANESTHESIA/SEDATION: Moderate (conscious) sedation was employed during this procedure. A total of Versed 2.0 mg and Fentanyl 100 mcg was administered intravenously. Moderate Sedation Time: 18 minutes. The patient's level of consciousness and vital signs were monitored continuously by radiology nursing throughout the procedure under my direct supervision. FLUOROSCOPY TIME:  Fluoroscopy Time:   (1 mGy). COMPLICATIONS: None PROCEDURE: Informed written consent was obtained from the patient after a thorough discussion of the procedural risks, benefits and alternatives. All questions were addressed. Maximal Sterile Barrier Technique was utilized including caps, mask, sterile gowns, sterile gloves, sterile drape, hand hygiene and skin antiseptic. A timeout was performed prior to the initiation of the procedure. Ultrasound survey was performed with images stored and sent to PACs. Right IJ vein documented to be patent. The right neck and chest was prepped with chlorhexidine, and draped in the usual sterile fashion using maximum barrier technique (cap and mask, sterile gown, sterile gloves, large sterile sheet, hand  hygiene and cutaneous antiseptic). Local anesthesia was attained by infiltration with 1% lidocaine without epinephrine. Ultrasound demonstrated patency of the right internal jugular vein, and this was documented with an image. Under real-time ultrasound guidance, this vein was accessed with a 21 gauge micropuncture needle and image documentation was performed. A small dermatotomy was made at the access site with an 11 scalpel. A 0.018" wire was advanced into the SVC and used to estimate the length of the internal catheter. The access needle exchanged for a 18F micropuncture vascular sheath. The 0.018" wire was then removed and a 0.035" wire advanced into the IVC. An appropriate location for the subcutaneous reservoir was selected below the clavicle and an incision was made through the skin and underlying soft tissues. The  subcutaneous tissues were then dissected using a combination of blunt and sharp surgical technique and a pocket was formed. A single lumen power injectable portacatheter was then tunneled through the subcutaneous tissues from the pocket to the dermatotomy and the port reservoir placed within the subcutaneous pocket. The venous access site was then serially dilated and a peel away vascular sheath placed over the wire. The wire was removed and the port catheter advanced into position under fluoroscopic guidance. The catheter tip is positioned in the cavoatrial junction. This was documented with a spot image. The portacatheter was then tested and found to flush and aspirate well. The pocket was then closed in two layers using first subdermal inverted interrupted absorbable sutures followed by a running subcuticular suture. The epidermis was then sealed with Dermabond. Steri-Strips were placed. The dermatotomy at the venous access site was also seal with Dermabond. After completion, the [facility] needle was used to access the port and additional flush was performed to confirm positioning and function. The  patient has immediate appointment for use. Patient tolerated the procedure well and remained hemodynamically stable throughout. No complications encountered and no significant blood loss encountered IMPRESSION: Status post right IJ port catheter placement. Signed, Dulcy Fanny. Nadene Rubins, RPVI [facility] Electronically Signed   By: Corrie Mckusick D.O.   On: 08/11/2022 10:23   MR BRAIN W WO CONTRAST  Result Date: 08/10/2022 CLINICAL DATA:  Non-small-cell lung cancer, staging. EXAM: MRI HEAD WITHOUT AND WITH CONTRAST TECHNIQUE: Multiplanar, multiecho pulse sequences of the brain and surrounding structures were obtained without and with intravenous contrast. CONTRAST:  16mL GADAVIST GADOBUTROL 1 MMOL/ML IV SOLN COMPARISON:  MRI brain 02/08/2020. FINDINGS: Brain: Unchanged encephalomalacia in the right middle frontal gyrus and right parietal lobe, favored to reflect old infarcts. No acute infarct or intracranial hemorrhage. Single microhemorrhage in the left basal ganglia. No mass or abnormal enhancement. No hydrocephalus or extra-axial collection. Vascular: Normal flow voids. Skull and upper cervical spine: Normal marrow signal and enhancement. Sinuses/Orbits: Unremarkable. Other: None. IMPRESSION: No evidence of metastatic disease in the brain. Electronically Signed   By: Emmit Alexanders M.D.   On: 08/10/2022 10:46     ASSESSMENT/PLAN:  This is a very pleasant 77 year old Caucasian male recently diagnosed with metastatic poorly differentiated carcinoma, neuroendocrine carcinoma of suspicious prostate primary versus primary lung cancer presented with innumerable and bilateral pulmonary nodules in addition to right hilar and mediastinal lymphadenopathy and metastatic disease to several areas of the bone in addition to hypermetabolic activity in the prostate gland diagnosed in January 2024.   The patient was evaluated by radiation oncology but he is  can hold off any palliative radiation to the metastatic bone lesions at this time.  He also follows with Dr. Junious Silk who arranged for a prostate biopsy on 08/12/2022 and the final pathology showed ***.   Also the patient's staging brain MRI was negative for metastatic disease.  The patient is currently undergoing palliative systemic chemotherapy with carboplatin for an AUC of 5, etoposide 100 mg/m on days 1, 2, and 3, and Imfinzi 1500 mg IV with Neulasta support. He underwent his first cycle #08/10/22.  He is status post 1 cycle and he tolerated it fairly well except for fatigue.  The patient was seen with Dr. Julien Nordmann today.  Labs were reviewed.  Recommend that he ***  Hydrate well?  Prostate biopsy?  The patient was advised to call immediately if she has any concerning symptoms in the  interval. The patient voices understanding of current disease status and treatment options and is in agreement with the current care plan. All questions were answered. The patient knows to call the clinic with any problems, questions or concerns. We can certainly see the patient much sooner if necessary     No orders of the defined types were placed in this encounter.    I spent {CHL ONC TIME VISIT - QMGNO:[id number]} counseling the patient face to face. The total time spent in the appointment was {CHL ONC TIME VISIT - QXIHW:[id number]}.  Itai Barbian L Jesper Stirewalt, PA-C 08/15/22

## 2022-08-16 ENCOUNTER — Other Ambulatory Visit: Payer: Self-pay

## 2022-08-17 ENCOUNTER — Telehealth: Payer: Self-pay | Admitting: Medical Oncology

## 2022-08-17 ENCOUNTER — Other Ambulatory Visit: Payer: Self-pay | Admitting: Medical Oncology

## 2022-08-17 NOTE — Telephone Encounter (Signed)
Lvm  to return my call re thrush . What mouth wash is he using ?Reminded him to use baking soda rinses. He called on call service on Sunday re mouth -Directions were given to him .

## 2022-08-18 ENCOUNTER — Other Ambulatory Visit: Payer: Self-pay | Admitting: Physician Assistant

## 2022-08-18 ENCOUNTER — Emergency Department (HOSPITAL_COMMUNITY): Payer: Medicare Other

## 2022-08-18 ENCOUNTER — Other Ambulatory Visit: Payer: Self-pay

## 2022-08-18 ENCOUNTER — Encounter (HOSPITAL_COMMUNITY): Payer: Self-pay

## 2022-08-18 ENCOUNTER — Observation Stay (HOSPITAL_COMMUNITY)
Admission: EM | Admit: 2022-08-18 | Discharge: 2022-08-19 | Disposition: A | Discharge status: Home / Self Care | Payer: Medicare Other | Attending: Internal Medicine | Admitting: Internal Medicine

## 2022-08-18 ENCOUNTER — Inpatient Hospital Stay: Payer: Medicare Other

## 2022-08-18 ENCOUNTER — Inpatient Hospital Stay (HOSPITAL_BASED_OUTPATIENT_CLINIC_OR_DEPARTMENT_OTHER): Payer: Medicare Other | Admitting: Physician Assistant

## 2022-08-18 VITALS — BP 152/78 | HR 61 | Temp 98.1°F | Resp 16 | Wt 188.8 lb

## 2022-08-18 DIAGNOSIS — Z960 Presence of urogenital implants: Secondary | ICD-10-CM

## 2022-08-18 DIAGNOSIS — N139 Obstructive and reflux uropathy, unspecified: Secondary | ICD-10-CM | POA: Insufficient documentation

## 2022-08-18 DIAGNOSIS — N132 Hydronephrosis with renal and ureteral calculous obstruction: Secondary | ICD-10-CM | POA: Diagnosis not present

## 2022-08-18 DIAGNOSIS — N1330 Unspecified hydronephrosis: Secondary | ICD-10-CM | POA: Diagnosis present

## 2022-08-18 DIAGNOSIS — C7A1 Malignant poorly differentiated neuroendocrine tumors: Secondary | ICD-10-CM

## 2022-08-18 DIAGNOSIS — D709 Neutropenia, unspecified: Secondary | ICD-10-CM | POA: Diagnosis present

## 2022-08-18 DIAGNOSIS — D61810 Antineoplastic chemotherapy induced pancytopenia: Secondary | ICD-10-CM | POA: Diagnosis present

## 2022-08-18 DIAGNOSIS — C3490 Malignant neoplasm of unspecified part of unspecified bronchus or lung: Secondary | ICD-10-CM

## 2022-08-18 DIAGNOSIS — N400 Enlarged prostate without lower urinary tract symptoms: Secondary | ICD-10-CM | POA: Diagnosis present

## 2022-08-18 DIAGNOSIS — Z95828 Presence of other vascular implants and grafts: Secondary | ICD-10-CM

## 2022-08-18 DIAGNOSIS — R338 Other retention of urine: Secondary | ICD-10-CM | POA: Diagnosis present

## 2022-08-18 DIAGNOSIS — C61 Malignant neoplasm of prostate: Secondary | ICD-10-CM | POA: Diagnosis not present

## 2022-08-18 DIAGNOSIS — N179 Acute kidney failure, unspecified: Principal | ICD-10-CM | POA: Diagnosis present

## 2022-08-18 DIAGNOSIS — Z7982 Long term (current) use of aspirin: Secondary | ICD-10-CM | POA: Diagnosis not present

## 2022-08-18 DIAGNOSIS — Z7952 Long term (current) use of systemic steroids: Secondary | ICD-10-CM | POA: Diagnosis not present

## 2022-08-18 DIAGNOSIS — R7989 Other specified abnormal findings of blood chemistry: Secondary | ICD-10-CM

## 2022-08-18 DIAGNOSIS — R300 Dysuria: Secondary | ICD-10-CM | POA: Diagnosis present

## 2022-08-18 DIAGNOSIS — B370 Candidal stomatitis: Secondary | ICD-10-CM | POA: Diagnosis not present

## 2022-08-18 DIAGNOSIS — R972 Elevated prostate specific antigen [PSA]: Secondary | ICD-10-CM | POA: Diagnosis present

## 2022-08-18 DIAGNOSIS — R339 Retention of urine, unspecified: Secondary | ICD-10-CM | POA: Diagnosis not present

## 2022-08-18 DIAGNOSIS — K5730 Diverticulosis of large intestine without perforation or abscess without bleeding: Secondary | ICD-10-CM | POA: Diagnosis not present

## 2022-08-18 DIAGNOSIS — I6529 Occlusion and stenosis of unspecified carotid artery: Secondary | ICD-10-CM | POA: Diagnosis present

## 2022-08-18 DIAGNOSIS — Z85828 Personal history of other malignant neoplasm of skin: Secondary | ICD-10-CM | POA: Diagnosis not present

## 2022-08-18 DIAGNOSIS — K6389 Other specified diseases of intestine: Secondary | ICD-10-CM | POA: Diagnosis not present

## 2022-08-18 DIAGNOSIS — E875 Hyperkalemia: Secondary | ICD-10-CM | POA: Diagnosis present

## 2022-08-18 DIAGNOSIS — Z9889 Other specified postprocedural states: Secondary | ICD-10-CM

## 2022-08-18 DIAGNOSIS — I10 Essential (primary) hypertension: Secondary | ICD-10-CM | POA: Diagnosis not present

## 2022-08-18 DIAGNOSIS — E785 Hyperlipidemia, unspecified: Secondary | ICD-10-CM | POA: Diagnosis present

## 2022-08-18 DIAGNOSIS — T451X5A Adverse effect of antineoplastic and immunosuppressive drugs, initial encounter: Secondary | ICD-10-CM | POA: Diagnosis present

## 2022-08-18 LAB — URINALYSIS, ROUTINE W REFLEX MICROSCOPIC
Bacteria, UA: NONE SEEN
Bilirubin Urine: NEGATIVE
Glucose, UA: NEGATIVE mg/dL
Ketones, ur: NEGATIVE mg/dL
Leukocytes,Ua: NEGATIVE
Nitrite: NEGATIVE
Protein, ur: NEGATIVE mg/dL
Specific Gravity, Urine: 1.01 (ref 1.005–1.030)
pH: 6 (ref 5.0–8.0)

## 2022-08-18 LAB — COMPREHENSIVE METABOLIC PANEL
ALT: 47 U/L — ABNORMAL HIGH (ref 0–44)
AST: 29 U/L (ref 15–41)
Albumin: 3.2 g/dL — ABNORMAL LOW (ref 3.5–5.0)
Alkaline Phosphatase: 88 U/L (ref 38–126)
Anion gap: 12 (ref 5–15)
BUN: 75 mg/dL — ABNORMAL HIGH (ref 8–23)
CO2: 21 mmol/L — ABNORMAL LOW (ref 22–32)
Calcium: 8.3 mg/dL — ABNORMAL LOW (ref 8.9–10.3)
Chloride: 103 mmol/L (ref 98–111)
Creatinine, Ser: 4.89 mg/dL — ABNORMAL HIGH (ref 0.61–1.24)
GFR, Estimated: 12 mL/min — ABNORMAL LOW (ref >60)
Glucose, Bld: 88 mg/dL (ref 70–99)
Potassium: 5.2 mmol/L — ABNORMAL HIGH (ref 3.5–5.1)
Sodium: 136 mmol/L (ref 135–145)
Total Bilirubin: 1.2 mg/dL (ref 0.3–1.2)
Total Protein: 5.8 g/dL — ABNORMAL LOW (ref 6.5–8.1)

## 2022-08-18 LAB — CBC WITH DIFFERENTIAL (CANCER CENTER ONLY)
Abs Immature Granulocytes: 0 10*3/uL (ref 0.00–0.07)
Basophils Absolute: 0 10*3/uL (ref 0.0–0.1)
Basophils Relative: 0 %
Eosinophils Absolute: 0.1 10*3/uL (ref 0.0–0.5)
Eosinophils Relative: 10 %
HCT: 30.4 % — ABNORMAL LOW (ref 39.0–52.0)
Hemoglobin: 10.8 g/dL — ABNORMAL LOW (ref 13.0–17.0)
Immature Granulocytes: 0 %
Lymphocytes Relative: 83 %
Lymphs Abs: 0.6 10*3/uL — ABNORMAL LOW (ref 0.7–4.0)
MCH: 34.8 pg — ABNORMAL HIGH (ref 26.0–34.0)
MCHC: 35.5 g/dL (ref 30.0–36.0)
MCV: 98.1 fL (ref 80.0–100.0)
Monocytes Absolute: 0 10*3/uL — ABNORMAL LOW (ref 0.1–1.0)
Monocytes Relative: 2 %
Neutro Abs: 0 10*3/uL — CL (ref 1.7–7.7)
Neutrophils Relative %: 5 %
Platelet Count: 46 10*3/uL — ABNORMAL LOW (ref 150–400)
RBC: 3.1 MIL/uL — ABNORMAL LOW (ref 4.22–5.81)
RDW: 12.8 % (ref 11.5–15.5)
Smear Review: NORMAL
WBC Count: 0.7 10*3/uL — CL (ref 4.0–10.5)
nRBC: 0 % (ref 0.0–0.2)

## 2022-08-18 LAB — CMP (CANCER CENTER ONLY)
ALT: 44 U/L (ref 0–44)
AST: 25 U/L (ref 15–41)
Albumin: 3.4 g/dL — ABNORMAL LOW (ref 3.5–5.0)
Alkaline Phosphatase: 104 U/L (ref 38–126)
Anion gap: 9 (ref 5–15)
BUN: 81 mg/dL — ABNORMAL HIGH (ref 8–23)
CO2: 22 mmol/L (ref 22–32)
Calcium: 8.3 mg/dL — ABNORMAL LOW (ref 8.9–10.3)
Chloride: 103 mmol/L (ref 98–111)
Creatinine: 5.41 mg/dL — ABNORMAL HIGH (ref 0.61–1.24)
GFR, Estimated: 10 mL/min — ABNORMAL LOW (ref >60)
Glucose, Bld: 109 mg/dL — ABNORMAL HIGH (ref 70–99)
Potassium: 5.3 mmol/L — ABNORMAL HIGH (ref 3.5–5.1)
Sodium: 134 mmol/L — ABNORMAL LOW (ref 135–145)
Total Bilirubin: 0.8 mg/dL (ref 0.3–1.2)
Total Protein: 5.8 g/dL — ABNORMAL LOW (ref 6.5–8.1)

## 2022-08-18 LAB — CBC WITH DIFFERENTIAL/PLATELET
Abs Immature Granulocytes: 0 10*3/uL (ref 0.00–0.07)
Basophils Absolute: 0 10*3/uL (ref 0.0–0.1)
Basophils Relative: 1 %
Eosinophils Absolute: 0.1 10*3/uL (ref 0.0–0.5)
Eosinophils Relative: 10 %
HCT: 31.7 % — ABNORMAL LOW (ref 39.0–52.0)
Hemoglobin: 11 g/dL — ABNORMAL LOW (ref 13.0–17.0)
Immature Granulocytes: 0 %
Lymphocytes Relative: 86 %
Lymphs Abs: 0.6 10*3/uL — ABNORMAL LOW (ref 0.7–4.0)
MCH: 35.3 pg — ABNORMAL HIGH (ref 26.0–34.0)
MCHC: 34.7 g/dL (ref 30.0–36.0)
MCV: 101.6 fL — ABNORMAL HIGH (ref 80.0–100.0)
Monocytes Absolute: 0 10*3/uL — ABNORMAL LOW (ref 0.1–1.0)
Monocytes Relative: 0 %
Neutro Abs: 0 10*3/uL — CL (ref 1.7–7.7)
Neutrophils Relative %: 3 %
Platelets: 44 10*3/uL — ABNORMAL LOW (ref 150–400)
RBC: 3.12 MIL/uL — ABNORMAL LOW (ref 4.22–5.81)
RDW: 12.9 % (ref 11.5–15.5)
WBC: 0.7 10*3/uL — CL (ref 4.0–10.5)
nRBC: 0 % (ref 0.0–0.2)

## 2022-08-18 LAB — LACTATE DEHYDROGENASE: LDH: 196 U/L — ABNORMAL HIGH (ref 98–192)

## 2022-08-18 LAB — URIC ACID: Uric Acid, Serum: 6.3 mg/dL (ref 3.7–8.6)

## 2022-08-18 LAB — PHOSPHORUS: Phosphorus: 5.7 mg/dL — ABNORMAL HIGH (ref 2.5–4.6)

## 2022-08-18 MED ORDER — POLYETHYL GLYC-PROPYL GLYC PF 0.4-0.3 % OP SOLN: 1.0000 [drp] | Freq: Three times a day (TID) | OPHTHALMIC | Status: DC | PRN

## 2022-08-18 MED ORDER — FLUCONAZOLE 100 MG PO TABS
100.0000 mg | ORAL_TABLET | Freq: Every day | ORAL | Status: DC
  Administered 2022-08-18: 100 mg via ORAL
  Filled 2022-08-18: qty 1

## 2022-08-18 MED ORDER — SODIUM CHLORIDE 0.9 % IV SOLN: INTRAVENOUS | Status: DC

## 2022-08-18 MED ORDER — TAMSULOSIN HCL 0.4 MG PO CAPS: 0.4000 mg | ORAL_CAPSULE | Freq: Every day | ORAL | Status: DC

## 2022-08-18 MED ORDER — ONDANSETRON HCL 4 MG PO TABS: 4.0000 mg | ORAL_TABLET | Freq: Four times a day (QID) | ORAL | Status: DC | PRN

## 2022-08-18 MED ORDER — ONDANSETRON HCL 4 MG/2ML IJ SOLN: 4.0000 mg | Freq: Four times a day (QID) | INTRAMUSCULAR | Status: DC | PRN

## 2022-08-18 MED ORDER — ACETAMINOPHEN 500 MG PO TABS
1000.0000 mg | ORAL_TABLET | Freq: Once | ORAL | Status: AC
  Administered 2022-08-18: 1000 mg via ORAL
  Filled 2022-08-18: qty 2

## 2022-08-18 MED ORDER — HEPARIN SOD (PORK) LOCK FLUSH 100 UNIT/ML IV SOLN
500.0000 [IU] | Freq: Once | INTRAVENOUS | Status: AC
  Administered 2022-08-18: 500 [IU]

## 2022-08-18 MED ORDER — ROSUVASTATIN CALCIUM 20 MG PO TABS
20.0000 mg | ORAL_TABLET | Freq: Every day | ORAL | Status: DC
  Administered 2022-08-19: 20 mg via ORAL
  Filled 2022-08-18: qty 1

## 2022-08-18 MED ORDER — FLUCONAZOLE 100 MG PO TABS: 100.0000 mg | ORAL_TABLET | Freq: Every day | ORAL | 0 refills | Status: DC

## 2022-08-18 MED ORDER — SODIUM CHLORIDE 0.9 % IV BOLUS
1000.0000 mL | Freq: Once | INTRAVENOUS | Status: AC
  Administered 2022-08-18: 1000 mL via INTRAVENOUS

## 2022-08-18 MED ORDER — MELATONIN 3 MG PO TABS
3.0000 mg | ORAL_TABLET | Freq: Once | ORAL | Status: AC
  Administered 2022-08-18: 3 mg via ORAL
  Filled 2022-08-18: qty 1

## 2022-08-18 MED ORDER — SODIUM CHLORIDE 0.9% FLUSH
10.0000 mL | Freq: Once | INTRAVENOUS | Status: AC
  Administered 2022-08-18: 10 mL

## 2022-08-18 MED ORDER — POLYETHYLENE GLYCOL 3350 17 G PO PACK
17.0000 g | PACK | Freq: Every day | ORAL | Status: DC | PRN
  Administered 2022-08-19: 17 g via ORAL
  Filled 2022-08-18: qty 1

## 2022-08-18 MED ORDER — HYDRALAZINE HCL 20 MG/ML IJ SOLN: 10.0000 mg | INTRAMUSCULAR | Status: DC | PRN

## 2022-08-18 MED ORDER — ACETAMINOPHEN 650 MG RE SUPP: 650.0000 mg | Freq: Four times a day (QID) | RECTAL | Status: DC | PRN

## 2022-08-18 MED ORDER — POLYVINYL ALCOHOL 1.4 % OP SOLN: 1.0000 [drp] | Freq: Three times a day (TID) | OPHTHALMIC | Status: DC | PRN

## 2022-08-18 MED ORDER — FLUTICASONE PROPIONATE 50 MCG/ACT NA SUSP: 1.0000 | Freq: Every day | NASAL | Status: DC | PRN

## 2022-08-18 MED ORDER — FINASTERIDE 5 MG PO TABS
5.0000 mg | ORAL_TABLET | Freq: Every day | ORAL | Status: DC
  Administered 2022-08-19: 5 mg via ORAL
  Filled 2022-08-18: qty 1

## 2022-08-18 MED ORDER — ACETAMINOPHEN 325 MG PO TABS
650.0000 mg | ORAL_TABLET | Freq: Four times a day (QID) | ORAL | Status: DC | PRN
  Administered 2022-08-19: 650 mg via ORAL
  Filled 2022-08-18: qty 2

## 2022-08-18 NOTE — ED Triage Notes (Signed)
Pt coming from cancer center today and had his creatinine result at 5.41. Oncology team concerned about tumor lysis syndrome. Pt c/o feeling lightheaded. Patient was being seen today to follow up from chemo.

## 2022-08-18 NOTE — ED Notes (Signed)
Bladder scanner pt, pt is retaining 736 mL of urine in bladder.

## 2022-08-18 NOTE — Progress Notes (Signed)
This nurse transferred patient to ED room 24.  Report given to nurse.  Advised that patients creatinine is 5 and oncology provider is concerned of Tumor Lysis Syndrome. Advised that patient complained of loose stools and feeling lightheaded intermittently.  Nurse acknowledged understanding.

## 2022-08-18 NOTE — H&P (Signed)
History and Physical  Patient: Larry Kent:[id number] DOB: 1945-11-05 DOA: 08/18/2022 DOS: the patient was seen and examined on 08/18/2022 Patient coming from: Home  Chief Complaint:  Chief Complaint  Patient presents with   Abnormal Lab   HPI: Larry Kent is a 77 y.o. male with PMH significant of PAD, HLD, diastolic dysfunction, BPH, recently diagnosed neuroendocrine cancer suspecting lung primary, elevated PSA with recent prostate biopsy, basal cell carcinoma. The patient presented to hospital from cancer center due to abnormal serum creatinine. The patient is recently diagnosed with neuroendocrine tumor with lung primary. Underwent a Port-A-Cath placement on 2/13. Underwent further workup including a PET scan which showed some uptake in the prostate as well as with other nodules seen earlier. Patient follows up with Dr. Junious Silk for BPH.  His PSA was elevated in double digits and therefore he was sent for a prostate biopsy on 2/14. Since then he started having difficulty urination with dribbling urine and weak stream. For last 2 days patient has not been able to urinate at all and starts having more pain in his lower abdomen area as well as in bilateral flank area. Denies having any nausea or vomiting.  No diarrhea.  No constipation.  No fever no chills.  No chest pain.  No headache no dizziness no lightheadedness. Does not use any medications other than prescribed. No recent use of NSAIDs. Started on chemotherapy, first session was on 2/13 with Imfinzi, carboplatin and etoposide. He received his dose of pegfilgrastim on 2/17. Was started on oral mouthwash for oral thrush. He return to clinic for a follow-up and port flush as well as repeat labs at which time his creatinine was found to be 5.4.  He was sent to ER for concerns for tumor lysis syndrome. A Foley catheter was inserted as the bladder scan was showing 730 mL of urine in the bladder.  After inspection of the bladder  in ED patient has urinated 3 L so far. Abdominal pain is improved but does report some burning sensation secondary to Foley catheter at the time of my evaluation.  Review of Systems: As mentioned in the history of present illness. All other systems reviewed and are negative. Past Medical History:  Diagnosis Date   Allergy    Arthritis    both knees    BCC (basal cell carcinoma), arm, right 09/2019   MOHS ([place])   BCC (basal cell carcinoma), face 2014   L preauricular s/p MOHS   BCC (basal cell carcinoma), face 02/2018   L upper lip   BPH (benign prostatic hypertrophy)    Dr. Junious Silk @ [facility]   Carotid stenosis 81/1914   RICA 78-29%, LICA 5-62%, L vertebral occlusion, rpt 1 yr   Cataract    removed both eyes    FHx: colon cancer    FHx: prostate cancer    Heart murmur    mild aortic stenosis    History of kidney stones    Hyperlipidemia    borderline- on rosuvastatin now normal    Occlusion of right vertebral artery 05/2015   Past Surgical History:  Procedure Laterality Date   BASAL CELL CARCINOMA EXCISION  05/2018   lip, 2020 x3  basal cells removed    CATARACT EXTRACTION  2008   Left   CATARACT EXTRACTION  2013   Right ([place])   COLONOSCOPY  ~2010   medium int hemorrhoids, o/w WNL, rpt 5 yrs given fmhx (Dr. Oletta Lamas)   COLONOSCOPY  08/2019   TAs,  diverticulosis, rpt 3 yrs ([place])   ENDARTERECTOMY Right 12/01/2019   Procedure: RIGHT ENDARTERECTOMY CAROTID;  Surgeon: [name], MD;  Location: [facility];  Service: Vascular;  Laterality: Right;   exercise treadmill  11/2005   WNL [facility])   FINE NEEDLE ASPIRATION  07/28/2022   Procedure: FINE NEEDLE ASPIRATION (FNA) LINEAR;  Surgeon: Garner Nash, DO;  Location: [facility];  Service: Pulmonary;;   HERNIA REPAIR  2008   Right   IR IMAGING GUIDED PORT INSERTION  08/11/2022   MOHS SURGERY Left spring 2014   basal cell face   PATCH ANGIOPLASTY Right 12/01/2019   Procedure: [place];  Surgeon: [name], MD;  Location: [facility];  Service: Vascular;  Laterality: Right;   Testicular Biopsy  2003   benign, varicocele   US ECHOCARDIOGRAPHY  11/2005   aortic sclerosis, EF 95-63%, diastolic dysfunction   VIDEO BRONCHOSCOPY WITH ENDOBRONCHIAL ULTRASOUND Bilateral 07/28/2022   Procedure: VIDEO BRONCHOSCOPY WITH ENDOBRONCHIAL ULTRASOUND;  Surgeon: Garner Nash, DO;  Location: [facility];  Service: Pulmonary;  Laterality: Bilateral;   Social History:  reports that he has never smoked. He has never used smokeless tobacco. He reports that he does not currently use alcohol. He reports that he does not use drugs. No Known Allergies Family History  Problem Relation Age of Onset   Cancer Father 71       prostate   Coronary artery disease Father 36       MI   Prostate cancer Father    Cancer Paternal Grandfather 29       prostate   Prostate cancer Paternal Grandfather    Stroke Mother    Hypertension Mother    Cancer Mother 81       colon   Colon cancer Mother    Coronary artery disease Maternal Uncle    Cancer Sister 49       breast   Breast cancer Sister    Colon cancer Maternal Grandmother    Diabetes Neg Hx    Colon polyps Neg Hx    Esophageal cancer Neg Hx    Rectal cancer Neg Hx    Stomach cancer Neg Hx    Prior to Admission medications   Medication Sig Start Date End Date Taking? Authorizing Provider  acetaminophen (TYLENOL) 500 MG tablet Take 1,000 mg by mouth every 6 (six) hours as needed for mild pain or headache.   Yes [provider]  aspirin EC 81 MG tablet Take 1 tablet (81 mg total) by mouth daily. 08/02/18  Yes Wellington Hampshire, MD  finasteride (PROSCAR) 5 MG tablet Take 5 mg by mouth daily. 07/22/22  Yes [provider]  fluticasone (FLONASE) 50 MCG/ACT nasal spray Place 1 spray into both nostrils daily as needed for allergies or rhinitis.   Yes [provider]  lidocaine-prilocaine (EMLA)  cream Apply to the Port-A-Cath site 30-60 minutes before chemotherapy treatment Patient taking differently: Apply 1 Application topically See admin instructions. Apply to the Port-A-Cath site 30-60 minutes before chemotherapy treatment 08/03/22  Yes Curt Bears, MD  Multiple Vitamin (MULTIVITAMIN) tablet Take 1 tablet by mouth daily with breakfast.   Yes [provider]  prochlorperazine (COMPAZINE) 10 MG tablet Take 1 tablet (10 mg total) by mouth every 6 (six) hours as needed for nausea or vomiting. 08/03/22  Yes Curt Bears, MD  rosuvastatin (CRESTOR) 20 MG tablet TAKE 1 TABLET BY MOUTH EVERY DAY Patient taking differently: Take 20  mg by mouth in the morning. 04/28/22  Yes Ria Bush, MD  SYSTANE ULTRA PF 0.4-0.3 % SOLN Place 1 drop into both eyes 3 (three) times daily as needed (for dryness/irritation).   Yes [provider]  tamsulosin (FLOMAX) 0.4 MG CAPS capsule Take 0.4 mg by mouth in the morning and at bedtime. 11/16/18  Yes Ria Bush, MD  Zinc 25 MG TABS Take 25 mg by mouth daily.   Yes [provider]  fluconazole (DIFLUCAN) 100 MG tablet Take 1 tablet (100 mg total) by mouth daily. Patient not taking: Reported on 08/18/2022 08/18/22   Heilingoetter, Tobe Sos, PA-C   Physical Exam: Vitals:   08/18/22 1700 08/18/22 1715 08/18/22 1900 08/18/22 2026  BP: (!) 183/81 (!) 194/85  (!) 177/90  Pulse: 66 65  69  Resp:  14  18  Temp:   97.9 F (36.6 C) 98.3 F (36.8 C)  TempSrc:    Oral  SpO2: 99% 100%  97%  Weight:      Height:       General: Appear in mild distress; no visible Abnormal Neck Mass Or lumps, Conjunctiva normal Cardiovascular: S1 and S2 Present, mild aortic systolic Murmur, Respiratory: good respiratory effort, Bilateral Air entry present and CTA, no Crackles, no wheezes Abdomen: Bowel Sound present, Non tender  Extremities: no Pedal edema Neurology: alert and oriented to time, place, and person Gait not checked due to  patient safety concerns   Data Reviewed: I have reviewed ED notes, Vitals, Lab results and outpatient records. Since last encounter, pertinent lab results CBC and BMP   . I have ordered test including CBC and BMP  . I have discussed pt's care plan and test results with EDP  .   Assessment and Plan AKI (acute kidney injury) ([place]) Hyperkalemia Benign prostatic hyperplasia Hx of prostate biopsy Acute urinary retention Bilateral hydronephrosis PSA elevation Presents with complaints of lower abdominal pain and decreased urinary output since his prostate biopsy as well as fatigue and tiredness. Found to have serum creatinine of 5.4.  Baseline is 0.8. Mild potassium elevation of 5.3 as well. Bladder scan showed 700 mL in the bladder but patient urinated 3 L in ED after Foley insertion. Suspect his presentation is mostly secondary to AKI secondary to obstructive uropathy. Do not think the patient is suffering from a UTI.  His urinalysis is unremarkable. Suspect his recent prostate biopsy caused some minor swelling which might of caused retention. CT abdomen shows evidence of bilateral hydronephrosis. Currently we will continue with IV hydration and monitor renal function. Patient will most likely will be discharged with a Foley catheter given his bladder distention degree. Patient will require a follow-up with urology Dr. Junious Silk in 1 week for Foley catheter removal. Currently observing on telemetry due to hyperkalemia.  Suspect IV hydration will be enough for hyperkalemia collection rather than any other therapy.  Hx of prostate biopsy PSA elevation Performed on 2/14.  Patient has not heard the results.  Most likely cause some swelling which caused her retention and current presentation. Patient follows up with Dr. Junious Silk in the clinic for urological issues. Will continue Flomax and finasteride. May require oxybutynin for bladder spasm.  Large cell neuroendocrine carcinoma  (HCC) Malignant neoplasm of unspecified part of unspecified bronchus or lung (HCC) Neutropenia (HCC) Pancytopenia due to antineoplastic chemotherapy [facility]) Recently started on chemotherapy.  First session 2/13. Follows up with Dr. Julien Nordmann. Has developed absolute neutropenia with thrombocytopenia. Baseline hemoglobin around 13.  Currently around 11. Suspect  this might worsen further with IV hydration as well. Monitor for now. Avoiding chemical DVT prophylaxis as well as aspirin given his thrombocytopenia. Dr. Julien Nordmann recommended blood cultures.  Currently do not think the patient requires any IV antibiotics for now. There was initial concern for tumor lysis syndrome given the degree of hyperkalemia as well as AKI although suspect this is more related to urinary retention and therefore will monitor. Uric acid level is normal as well.  HLD (hyperlipidemia) Continue Crestor.  Carotid stenosis On aspirin currently on hold.  Port-A-Cath in place Currently being used for IV fluids.  Monitor.  Advance Care Planning:   Code Status: Full Code discussed with patient.  His wife will be his HCPOA. Consults: None.  Recommend urology consult in the morning for a follow-up arrangement. EDP discussed with Dr. Julien Nordmann Family Communication: Son and wife at bedside  Author: Berle Mull, MD 08/18/2022 8:30 PM For on call review [email or web address].

## 2022-08-18 NOTE — ED Provider Notes (Signed)
[facility] Provider Note  CSN: [id number] Arrival date & time: 08/18/22 1512  Chief Complaint(s) Abnormal Lab  HPI Larry Kent is a 77 y.o. male with history of metastatic neuroendocrine carcinoma presenting to the emergency department with abnormal labs.  Patient seen in clinic today, found to have creatinine of 5 from normal recently.  He was sent to the ER out of concern for possible tumor lysis syndrome.  Patient reports that otherwise he has felt okay, no fevers or chills.  No nausea, vomiting, diarrhea.  He reports some lower abdominal pain and dysuria over the past 2 to 3 days.  He also reports some dribbling urine.  No flank pain.  No syncope.  No chest pain or shortness of breath.  No coughing.  No headaches.   Past Medical History Past Medical History:  Diagnosis Date   Allergy    Arthritis    both knees    BCC (basal cell carcinoma), arm, right 09/2019   MOHS ([place])   BCC (basal cell carcinoma), face 2014   L preauricular s/p MOHS   BCC (basal cell carcinoma), face 02/2018   L upper lip   BPH (benign prostatic hypertrophy)    Dr. Junious Silk @ [facility]   Carotid stenosis 54/2706   RICA 23-76%, LICA 2-83%, L vertebral occlusion, rpt 1 yr   Cataract    removed both eyes    FHx: colon cancer    FHx: prostate cancer    Heart murmur    mild aortic stenosis    History of kidney stones    Hyperlipidemia    borderline- on rosuvastatin now normal    Occlusion of right vertebral artery 05/2015   Patient Active Problem List   Diagnosis Date Noted   Elevated serum creatinine 08/18/2022   Neutropenia ([place]) 08/18/2022   AKI (acute kidney injury) ([place]) 08/18/2022   Port-A-Cath in place 08/11/2022   Malignant neoplasm of unspecified part of unspecified bronchus or lung ([place]) 08/11/2022   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   Lung nodule 07/16/2022   Adenopathy 07/16/2022   Sensorineural hearing loss (SNHL)  of left ear with restricted hearing of right ear 02/22/2020   Cerebrovascular disease 02/22/2020   Carotid stenosis, asymptomatic, right 12/01/2019   Cervical radiculopathy 02/08/2018   Aortic stenosis 07/01/2016   Nonspecific abnormal electrocardiogram (ECG) (EKG) 07/01/2016   Obesity, Class I, BMI 30-34.9 06/10/2015   Carotid stenosis 05/30/2015   Occlusion of left vertebral artery 05/30/2015   Advanced care planning/counseling discussion 05/23/2014   Prediabetes 05/23/2014   Osteoarthritis of right knee 05/23/2014   Health maintenance examination 05/23/2014   Medicare annual wellness visit, subsequent 02/12/2012   HLD (hyperlipidemia) 02/12/2012   Benign prostatic hyperplasia    FHx: prostate cancer    FHx: colon cancer    Home Medication(s) Prior to Admission medications   Medication Sig Start Date End Date Taking? Authorizing Provider  acetaminophen (TYLENOL) 500 MG tablet Take 1,000 mg by mouth every 6 (six) hours as needed for mild pain or headache.   Yes [provider]  aspirin EC 81 MG tablet Take 1 tablet (81 mg total) by mouth daily. 08/02/18  Yes Wellington Hampshire, MD  finasteride (PROSCAR) 5 MG tablet Take 5 mg by mouth daily. 07/22/22  Yes [provider]  fluticasone (FLONASE) 50 MCG/ACT nasal spray Place 1 spray into both nostrils daily as needed for allergies or rhinitis.   Yes [provider]  lidocaine-prilocaine (EMLA)  cream Apply to the Port-A-Cath site 30-60 minutes before chemotherapy treatment Patient taking differently: Apply 1 Application topically See admin instructions. Apply to the Port-A-Cath site 30-60 minutes before chemotherapy treatment 08/03/22  Yes Curt Bears, MD  Multiple Vitamin (MULTIVITAMIN) tablet Take 1 tablet by mouth daily with breakfast.   Yes [provider]  prochlorperazine (COMPAZINE) 10 MG tablet Take 1 tablet (10 mg total) by mouth every 6 (six) hours as needed for nausea or vomiting. 08/03/22  Yes  Curt Bears, MD  rosuvastatin (CRESTOR) 20 MG tablet TAKE 1 TABLET BY MOUTH EVERY DAY Patient taking differently: Take 20 mg by mouth in the morning. 04/28/22  Yes Ria Bush, MD  SYSTANE ULTRA PF 0.4-0.3 % SOLN Place 1 drop into both eyes 3 (three) times daily as needed (for dryness/irritation).   Yes [provider]  tamsulosin (FLOMAX) 0.4 MG CAPS capsule Take 0.4 mg by mouth in the morning and at bedtime. 11/16/18  Yes Ria Bush, MD  Zinc 25 MG TABS Take 25 mg by mouth daily.   Yes [provider]  fluconazole (DIFLUCAN) 100 MG tablet Take 1 tablet (100 mg total) by mouth daily. Patient not taking: Reported on 08/18/2022 08/18/22   Heilingoetter, Tobe Sos, PA-C                                                                                                                                    Past Surgical History Past Surgical History:  Procedure Laterality Date   BASAL CELL CARCINOMA EXCISION  05/2018   lip, 2020 x3  basal cells removed    CATARACT EXTRACTION  2008   Left   CATARACT EXTRACTION  2013   Right ([place])   COLONOSCOPY  ~2010   medium int hemorrhoids, o/w WNL, rpt 5 yrs given fmhx (Dr. Oletta Lamas)   COLONOSCOPY  08/2019   TAs, diverticulosis, rpt 3 yrs ([place])   ENDARTERECTOMY Right 12/01/2019   Procedure: RIGHT ENDARTERECTOMY CAROTID;  Surgeon: [name], MD;  Location: [facility];  Service: Vascular;  Laterality: Right;   exercise treadmill  11/2005   WNL [place])   FINE NEEDLE ASPIRATION  07/28/2022   Procedure: FINE NEEDLE ASPIRATION (FNA) LINEAR;  Surgeon: Garner Nash, DO;  Location: [facility];  Service: Pulmonary;;   HERNIA REPAIR  2008   Right   IR IMAGING GUIDED PORT INSERTION  08/11/2022   MOHS SURGERY Left spring 2014   basal cell face   PATCH ANGIOPLASTY Right 12/01/2019   Procedure: [place];  Surgeon: [name], MD;  Location: [facility];  Service: Vascular;   Laterality: Right;   Testicular Biopsy  2003   benign, varicocele   US ECHOCARDIOGRAPHY  11/2005   aortic sclerosis, EF 57-84%, diastolic dysfunction   VIDEO BRONCHOSCOPY WITH ENDOBRONCHIAL ULTRASOUND Bilateral 07/28/2022   Procedure: VIDEO BRONCHOSCOPY WITH ENDOBRONCHIAL ULTRASOUND;  Surgeon: Garner Nash, DO;  Location: [facility];  Service: Pulmonary;  Laterality: Bilateral;   Family History Family History  Problem Relation Age of Onset   Cancer Father 98       prostate   Coronary artery disease Father 4       MI   Prostate cancer Father    Cancer Paternal Grandfather 75       prostate   Prostate cancer Paternal Grandfather    Stroke Mother    Hypertension Mother    Cancer Mother 71       colon   Colon cancer Mother    Coronary artery disease Maternal Uncle    Cancer Sister 9       breast   Breast cancer Sister    Colon cancer Maternal Grandmother    Diabetes Neg Hx    Colon polyps Neg Hx    Esophageal cancer Neg Hx    Rectal cancer Neg Hx    Stomach cancer Neg Hx     Social History Social History   Tobacco Use   Smoking status: Never   Smokeless tobacco: Never  Vaping Use   Vaping Use: Never used  Substance Use Topics   Alcohol use: Not Currently    Comment: rare   Drug use: No   Allergies Patient has no known allergies.  Review of Systems Review of Systems  All other systems reviewed and are negative.   Physical Exam Vital Signs  I have reviewed the triage vital signs BP (!) 194/85   Pulse 65   Temp 97.9 F (36.6 C) (Oral)   Resp 14   Ht 5\' 9"  (1.753 m)   Wt 83.5 kg   SpO2 100%   BMI 27.17 kg/m  Physical Exam Vitals and nursing note reviewed.  Constitutional:      General: He is not in acute distress.    Appearance: Normal appearance.  HENT:     Mouth/Throat:     Mouth: Mucous membranes are moist.  Eyes:     Conjunctiva/sclera: Conjunctivae normal.  Cardiovascular:     Rate and Rhythm: Normal rate and regular rhythm.   Pulmonary:     Effort: Pulmonary effort is normal. No respiratory distress.     Breath sounds: Normal breath sounds.  Abdominal:     General: Abdomen is flat.     Palpations: Abdomen is soft.     Tenderness: There is abdominal tenderness (lower abdominal).  Musculoskeletal:     Right lower leg: No edema.     Left lower leg: No edema.  Skin:    General: Skin is warm and dry.     Capillary Refill: Capillary refill takes less than 2 seconds.  Neurological:     Mental Status: He is alert and oriented to person, place, and time. Mental status is at baseline.  Psychiatric:        Mood and Affect: Mood normal.        Behavior: Behavior normal.     ED Results and Treatments Labs (all labs ordered are listed, but only abnormal results are displayed) Labs Reviewed  COMPREHENSIVE METABOLIC PANEL - Abnormal; Notable for the following components:      Result Value   Potassium 5.2 (*)    CO2 21 (*)    BUN 75 (*)    Creatinine, Ser 4.89 (*)    Calcium 8.3 (*)    Total Protein 5.8 (*)    Albumin 3.2 (*)    ALT 47 (*)    GFR, Estimated 12 (*)  All other components within normal limits  CBC WITH DIFFERENTIAL/PLATELET - Abnormal; Notable for the following components:   WBC 0.7 (*)    RBC 3.12 (*)    Hemoglobin 11.0 (*)    HCT 31.7 (*)    MCV 101.6 (*)    MCH 35.3 (*)    Platelets 44 (*)    Neutro Abs 0.0 (*)    Lymphs Abs 0.6 (*)    Monocytes Absolute 0.0 (*)    All other components within normal limits  LACTATE DEHYDROGENASE - Abnormal; Notable for the following components:   LDH 196 (*)    All other components within normal limits  PHOSPHORUS - Abnormal; Notable for the following components:   Phosphorus 5.7 (*)    All other components within normal limits  URINALYSIS, ROUTINE W REFLEX MICROSCOPIC - Abnormal; Notable for the following components:   Color, Urine STRAW (*)    Hgb urine dipstick SMALL (*)    All other components within normal limits  URINE CULTURE   CULTURE, BLOOD (ROUTINE X 2)  CULTURE, BLOOD (ROUTINE X 2)  URIC ACID                                                                                                                          Radiology CT ABDOMEN PELVIS WO CONTRAST  Result Date: 08/18/2022 CLINICAL DATA:  Abdominal pain, acute, nonlocalized EXAM: CT ABDOMEN AND PELVIS WITHOUT CONTRAST TECHNIQUE: Multidetector CT imaging of the abdomen and pelvis was performed following the standard protocol without IV contrast. RADIATION DOSE REDUCTION: This exam was performed according to the departmental dose-optimization program which includes automated exposure control, adjustment of the mA and/or kV according to patient size and/or use of iterative reconstruction technique. COMPARISON:  PET-CT 07/15/2022 FINDINGS: Lower chest: Stable small left pleural effusion. Coronary calcifications. Innumerable pulmonary nodules. Largest 2.8 cm in the right middle lobe, previously 2.4 cm. Largest left nodule in the anterior basal segment left lower lobe image 20/3 2.1 cm, previously 1.9. Hepatobiliary: No focal liver abnormality is seen. No gallstones, gallbladder wall thickening, or biliary dilatation. Pancreas: Diffuse parenchymal atrophy without mass or ductal dilatation. No regional inflammatory change. Spleen: Normal in size without focal abnormality. Adrenals/Urinary Tract: No adrenal mass. Left nephrolithiasis, largest stone 3 mm midpole. There is mild bilateral hydronephrosis right greater than left and ureterectasis down to thick-walled urinary bladder, partially decompressed by Foley catheter. Stomach/Bowel: Stomach is nondistended. Small bowel decompressed. Normal appendix. Colon is partially distended by gas and fecal material, with scattered distal descending and sigmoid diverticula; no adjacent inflammatory change. Vascular/Lymphatic: Moderate scattered calcified aortoiliac calcified plaque without aneurysm. Retroaortic left renal vein, anatomic  variant. No abdominal or pelvic adenopathy. Reproductive: Prostate enlargement with coarse calcifications Other: No ascites.  No free air. Musculoskeletal: Mild lumbar dextroscoliosis apex L3 with multilevel spondylitic change. Sclerotic lesions in bilateral inferior ischiopubic rami, left sacrum. For no fracture or acute process. IMPRESSION: 1. Mild bilateral hydronephrosis and ureterectasis down to thick-walled urinary bladder,  partially decompressed by Foley catheter. 2. Right nephrolithiasis. 3. Innumerable pulmonary nodules, some of which have increased in size. 4. Stable sacral and pelvic bone lesions. 5. Stable small left pleural effusion. 6. Descending and sigmoid diverticulosis. 7.  Aortic Atherosclerosis (ICD10-I70.0). Electronically Signed   By: Lucrezia Europe M.D.   On: 08/18/2022 17:52    Pertinent labs & imaging results that were available during my care of the patient were reviewed by me and considered in my medical decision making (see MDM for details).  Medications Ordered in ED Medications  sodium chloride 0.9 % bolus 1,000 mL (1,000 mLs Intravenous New Bag/Given 08/18/22 1645)  acetaminophen (TYLENOL) tablet 1,000 mg (1,000 mg Oral Given 08/18/22 1720)                                                                                                                                     Procedures Procedures  (including critical care time)  Medical Decision Making / ED Course   MDM:  77 year old male presenting to the emergency department with abnormal labs.  Patient overall well-appearing, exam notable for some mild lower abdominal tenderness.  No fever.  He was neutropenic on labs today.  Suspect most likely cause is acute urinary retention.  Bladder scan performed by nursing with 736 mL of urine in bladder.  Foley catheter placed.  Will check urine to evaluate for urinary infection.  Patient does have some mild lower abdominal tenderness, likely due to urinary retention but given  severe neutropenia will check CT scan to evaluate for other acute process such as typhlitis or other signs of intra-abdominal infection.  Will check tumor lysis labs although given alternative cause of acute renal failure suspect this is probably not the underlying cause, but will follow-up.  And LDH.  Patient will need admission given acute renal failure.  Clinical Course as of 08/18/22 1845  Tue Aug 18, 2022  1639 UA not concerning for infectious process. No bacteria/wbc. Will send culture given neutropenia.  [WS]  1742 Mild LDH elevation, mild elevation in phosphorus, urinalysis without crystals, uric acid normal. Lower concern for TLS.  [WS]  1753 Discussed with Dr. Earlie Server, agrees with admission.  He recommended add on blood cultures given severe neutropenia, defer antibiotics for now with no fever.  Urine culture also sent.  Pending CT scan. [WS]  1843 Patient admitted to hospitalist service by Dr. Posey Pronto.  [WS]    Clinical Course User Index [WS] Truett Mainland Livingston Diones, MD     Additional history obtained: -Additional history obtained from family -External records from outside source obtained and reviewed including: Chart review including previous notes, labs, imaging, consultation notes including oncology visit today   Lab Tests: -I ordered, reviewed, and interpreted labs.   The pertinent results include:   Labs Reviewed  COMPREHENSIVE METABOLIC PANEL - Abnormal; Notable for the following components:      Result Value   Potassium 5.2 (*)  CO2 21 (*)    BUN 75 (*)    Creatinine, Ser 4.89 (*)    Calcium 8.3 (*)    Total Protein 5.8 (*)    Albumin 3.2 (*)    ALT 47 (*)    GFR, Estimated 12 (*)    All other components within normal limits  CBC WITH DIFFERENTIAL/PLATELET - Abnormal; Notable for the following components:   WBC 0.7 (*)    RBC 3.12 (*)    Hemoglobin 11.0 (*)    HCT 31.7 (*)    MCV 101.6 (*)    MCH 35.3 (*)    Platelets 44 (*)    Neutro Abs 0.0 (*)    Lymphs  Abs 0.6 (*)    Monocytes Absolute 0.0 (*)    All other components within normal limits  LACTATE DEHYDROGENASE - Abnormal; Notable for the following components:   LDH 196 (*)    All other components within normal limits  PHOSPHORUS - Abnormal; Notable for the following components:   Phosphorus 5.7 (*)    All other components within normal limits  URINALYSIS, ROUTINE W REFLEX MICROSCOPIC - Abnormal; Notable for the following components:   Color, Urine STRAW (*)    Hgb urine dipstick SMALL (*)    All other components within normal limits  URINE CULTURE  CULTURE, BLOOD (ROUTINE X 2)  CULTURE, BLOOD (ROUTINE X 2)  URIC ACID    Notable for neutropenia, acute renal failure, normal uric acid  EKG   EKG Interpretation  Date/Time:  Tuesday August 18 2022 16:01:01 EST Ventricular Rate:  66 PR Interval:  172 QRS Duration: 100 QT Interval:  400 QTC Calculation: 420 R Axis:   78 Text Interpretation: Sinus rhythm Inferolateral infarct, old Confirmed by Garnette Gunner [phone]) on 08/18/2022 4:36:28 PM         Imaging Studies ordered: I ordered imaging studies including CT A/P On my interpretation imaging demonstrates secondary signs of obstructive uropathy I independently visualized and interpreted imaging. I agree with the radiologist interpretation   Medicines ordered and prescription drug management: Meds ordered this encounter  Medications   sodium chloride 0.9 % bolus 1,000 mL   acetaminophen (TYLENOL) tablet 1,000 mg    -I have reviewed the patients home medicines and have made adjustments as needed   Consultations Obtained: I requested consultation with the oncologist,  and discussed lab and imaging findings as well as pertinent plan - they recommend: admission   Cardiac Monitoring: The patient was maintained on a cardiac monitor.  I personally viewed and interpreted the cardiac monitored which showed an underlying rhythm of: NSR    Reevaluation: After the  interventions noted above, I reevaluated the patient and found that they have improved  Co morbidities that complicate the patient evaluation  Past Medical History:  Diagnosis Date   Allergy    Arthritis    both knees    BCC (basal cell carcinoma), arm, right 09/2019   MOHS ([place])   BCC (basal cell carcinoma), face 2014   L preauricular s/p MOHS   BCC (basal cell carcinoma), face 02/2018   L upper lip   BPH (benign prostatic hypertrophy)    Dr. Junious Silk @ [facility]   Carotid stenosis 45/8099   RICA 83-38%, LICA 2-50%, L vertebral occlusion, rpt 1 yr   Cataract    removed both eyes    FHx: colon cancer    FHx: prostate cancer    Heart murmur    mild aortic stenosis  History of kidney stones    Hyperlipidemia    borderline- on rosuvastatin now normal    Occlusion of right vertebral artery 05/2015      Dispostion: Disposition decision including need for hospitalization was considered, and patient admitted to the hospital.    Final Clinical Impression(s) / ED Diagnoses Final diagnoses:  Lower obstructive uropathy  Acute renal failure, unspecified acute renal failure type [facility])     This chart was dictated using voice recognition software.  Despite best efforts to proofread,  errors can occur which can change the documentation meaning.    Cristie Hem, MD 08/18/22 [phone]

## 2022-08-19 ENCOUNTER — Other Ambulatory Visit: Payer: Self-pay

## 2022-08-19 DIAGNOSIS — C7B8 Other secondary neuroendocrine tumors: Secondary | ICD-10-CM | POA: Diagnosis not present

## 2022-08-19 DIAGNOSIS — C3490 Malignant neoplasm of unspecified part of unspecified bronchus or lung: Secondary | ICD-10-CM | POA: Diagnosis not present

## 2022-08-19 DIAGNOSIS — C61 Malignant neoplasm of prostate: Secondary | ICD-10-CM | POA: Diagnosis present

## 2022-08-19 DIAGNOSIS — D709 Neutropenia, unspecified: Secondary | ICD-10-CM | POA: Diagnosis not present

## 2022-08-19 DIAGNOSIS — Z8249 Family history of ischemic heart disease and other diseases of the circulatory system: Secondary | ICD-10-CM | POA: Diagnosis not present

## 2022-08-19 DIAGNOSIS — D61810 Antineoplastic chemotherapy induced pancytopenia: Secondary | ICD-10-CM | POA: Diagnosis not present

## 2022-08-19 DIAGNOSIS — R339 Retention of urine, unspecified: Secondary | ICD-10-CM | POA: Diagnosis not present

## 2022-08-19 DIAGNOSIS — Z803 Family history of malignant neoplasm of breast: Secondary | ICD-10-CM | POA: Diagnosis not present

## 2022-08-19 DIAGNOSIS — Z87442 Personal history of urinary calculi: Secondary | ICD-10-CM | POA: Diagnosis not present

## 2022-08-19 DIAGNOSIS — N179 Acute kidney failure, unspecified: Secondary | ICD-10-CM | POA: Diagnosis not present

## 2022-08-19 DIAGNOSIS — D61818 Other pancytopenia: Secondary | ICD-10-CM | POA: Diagnosis not present

## 2022-08-19 DIAGNOSIS — E785 Hyperlipidemia, unspecified: Secondary | ICD-10-CM | POA: Diagnosis not present

## 2022-08-19 DIAGNOSIS — Z800 Family history of malignant neoplasm of digestive organs: Secondary | ICD-10-CM | POA: Diagnosis not present

## 2022-08-19 DIAGNOSIS — J90 Pleural effusion, not elsewhere classified: Secondary | ICD-10-CM | POA: Diagnosis not present

## 2022-08-19 DIAGNOSIS — C7A1 Malignant poorly differentiated neuroendocrine tumors: Secondary | ICD-10-CM | POA: Diagnosis not present

## 2022-08-19 DIAGNOSIS — Z9842 Cataract extraction status, left eye: Secondary | ICD-10-CM | POA: Diagnosis not present

## 2022-08-19 DIAGNOSIS — E861 Hypovolemia: Secondary | ICD-10-CM | POA: Diagnosis not present

## 2022-08-19 DIAGNOSIS — R040 Epistaxis: Secondary | ICD-10-CM | POA: Diagnosis not present

## 2022-08-19 DIAGNOSIS — X58XXXA Exposure to other specified factors, initial encounter: Secondary | ICD-10-CM | POA: Diagnosis present

## 2022-08-19 DIAGNOSIS — E871 Hypo-osmolality and hyponatremia: Secondary | ICD-10-CM | POA: Diagnosis not present

## 2022-08-19 DIAGNOSIS — T451X5A Adverse effect of antineoplastic and immunosuppressive drugs, initial encounter: Secondary | ICD-10-CM | POA: Diagnosis not present

## 2022-08-19 DIAGNOSIS — Z8042 Family history of malignant neoplasm of prostate: Secondary | ICD-10-CM | POA: Diagnosis not present

## 2022-08-19 DIAGNOSIS — D696 Thrombocytopenia, unspecified: Secondary | ICD-10-CM | POA: Diagnosis not present

## 2022-08-19 DIAGNOSIS — Z9841 Cataract extraction status, right eye: Secondary | ICD-10-CM | POA: Diagnosis not present

## 2022-08-19 DIAGNOSIS — Z823 Family history of stroke: Secondary | ICD-10-CM | POA: Diagnosis not present

## 2022-08-19 DIAGNOSIS — K5900 Constipation, unspecified: Secondary | ICD-10-CM | POA: Diagnosis not present

## 2022-08-19 DIAGNOSIS — Z961 Presence of intraocular lens: Secondary | ICD-10-CM | POA: Diagnosis not present

## 2022-08-19 DIAGNOSIS — Z85828 Personal history of other malignant neoplasm of skin: Secondary | ICD-10-CM | POA: Diagnosis not present

## 2022-08-19 DIAGNOSIS — R000 Tachycardia, unspecified: Secondary | ICD-10-CM | POA: Diagnosis not present

## 2022-08-19 DIAGNOSIS — Z1152 Encounter for screening for COVID-19: Secondary | ICD-10-CM | POA: Diagnosis not present

## 2022-08-19 DIAGNOSIS — R5081 Fever presenting with conditions classified elsewhere: Secondary | ICD-10-CM | POA: Diagnosis not present

## 2022-08-19 LAB — COMPREHENSIVE METABOLIC PANEL
ALT: 40 U/L (ref 0–44)
AST: 27 U/L (ref 15–41)
Albumin: 3.2 g/dL — ABNORMAL LOW (ref 3.5–5.0)
Alkaline Phosphatase: 80 U/L (ref 38–126)
Anion gap: 6 (ref 5–15)
BUN: 38 mg/dL — ABNORMAL HIGH (ref 8–23)
CO2: 23 mmol/L (ref 22–32)
Calcium: 8.4 mg/dL — ABNORMAL LOW (ref 8.9–10.3)
Chloride: 104 mmol/L (ref 98–111)
Creatinine, Ser: 1.55 mg/dL — ABNORMAL HIGH (ref 0.61–1.24)
GFR, Estimated: 46 mL/min — ABNORMAL LOW (ref >60)
Glucose, Bld: 98 mg/dL (ref 70–99)
Potassium: 5 mmol/L (ref 3.5–5.1)
Sodium: 133 mmol/L — ABNORMAL LOW (ref 135–145)
Total Bilirubin: 1.7 mg/dL — ABNORMAL HIGH (ref 0.3–1.2)
Total Protein: 5.7 g/dL — ABNORMAL LOW (ref 6.5–8.1)

## 2022-08-19 LAB — PHOSPHORUS: Phosphorus: 3.1 mg/dL (ref 2.5–4.6)

## 2022-08-19 LAB — URINE CULTURE: Culture: NO GROWTH

## 2022-08-19 LAB — MAGNESIUM: Magnesium: 1.9 mg/dL (ref 1.7–2.4)

## 2022-08-19 MED ORDER — HEPARIN SOD (PORK) LOCK FLUSH 100 UNIT/ML IV SOLN
500.0000 [IU] | Freq: Once | INTRAVENOUS | Status: AC
  Administered 2022-08-19: 500 [IU] via INTRAVENOUS
  Filled 2022-08-19: qty 5

## 2022-08-19 MED ORDER — TAMSULOSIN HCL 0.4 MG PO CAPS: 0.8000 mg | ORAL_CAPSULE | Freq: Two times a day (BID) | ORAL | 0 refills | Status: AC

## 2022-08-19 MED ORDER — CHLORHEXIDINE GLUCONATE CLOTH 2 % EX PADS
6.0000 | MEDICATED_PAD | Freq: Every day | CUTANEOUS | Status: DC
  Administered 2022-08-19: 6 via TOPICAL

## 2022-08-19 NOTE — Discharge Summary (Signed)
Physician Discharge Summary  Larry Kent EZM:[id number] DOB: [birth year] DOA: 08/18/2022  PCP: Ria Bush, MD  Admit date: 08/18/2022 Discharge date: 08/19/2022  Admitted From: Home Disposition: Home  Recommendations for Outpatient Follow-up:  Follow up with urology, Dr. Junious Silk for Foley removal Follow-up with Dr. Julien Nordmann Follow-up for final urine culture and blood culture result   Discharge Condition: Stable CODE STATUS: Full code Diet recommendation:  Diet Orders (From admission, onward)     Start     Ordered   08/19/22 0000  Diet - low sodium heart healthy        08/19/22 1303   08/18/22 1913  Diet 2 gram sodium Room service appropriate? Yes; Fluid consistency: Thin  Diet effective now       Question Answer Comment  Room service appropriate? Yes   Fluid consistency: Thin      08/18/22 1914           Brief/Interim Summary: Larry Kent is a 77 y.o. male with PMH significant of PAD, HLD, diastolic dysfunction, BPH, recently diagnosed neuroendocrine cancer suspecting lung primary, elevated PSA with recent prostate biopsy, basal cell carcinoma. The patient presented to hospital from cancer center due to abnormal serum creatinine. The patient is recently diagnosed with neuroendocrine tumor with lung primary. Underwent a Port-A-Cath placement on 2/13. Underwent further workup including a PET scan which showed some uptake in the prostate as well as with other nodules seen earlier. Patient follows up with Dr. Junious Silk for BPH.  His PSA was elevated in double digits and therefore he was sent for a prostate biopsy on 2/14. Since then he started having difficulty urination with dribbling urine and weak stream. For last 2 days patient has not been able to urinate at all and starts having more pain in his lower abdomen area as well as in bilateral flank area. Denies having any nausea or vomiting.  No diarrhea.  No constipation.  No fever no chills.  No chest pain.  No  headache no dizziness no lightheadedness. Does not use any medications other than prescribed. No recent use of NSAIDs. Started on chemotherapy, first session was on 2/13 with Imfinzi, carboplatin and etoposide. He received his dose of pegfilgrastim on 2/17. Was started on oral mouthwash for oral thrush. He return to clinic for a follow-up and port flush as well as repeat labs at which time his creatinine was found to be 5.4.  He was sent to ER for concerns for tumor lysis syndrome. A Foley catheter was inserted as the bladder scan was showing 730 mL of urine in the bladder.  After inspection of the bladder in ED patient has urinated 3 L so far. Abdominal pain is improved but does report some burning sensation secondary to Foley catheter at the time of my evaluation. Patient's creatinine continue to improve and went down to 1.5 on day of discharge.  Foley catheter draining well.  Patient will follow-up with Dr. Junious Silk, urology, as outpatient for voiding trial. He was seen by [facility] prior to discharge home, his pathology from prostate biopsy did result with high-grade prostate cancer. Results sent to Dr. Julien Nordmann.   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[facility])   Port-A-Cath in place   Malignant neoplasm of unspecified part of unspecified bronchus or lung (HCC)   Neutropenia (HCC)   Hyperkalemia  Pancytopenia due to antineoplastic chemotherapy (HCC)   Hx of prostate biopsy   Acute urinary retention   Bilateral hydronephrosis   PSA elevation     Discharge Instructions  Discharge Instructions     Call MD for:  difficulty breathing, headache or visual disturbances   Complete by: As directed    Call MD for:  extreme fatigue   Complete by: As directed    Call MD for:  hives   Complete by: As directed    Call MD for:   persistant dizziness or light-headedness   Complete by: As directed    Call MD for:  persistant nausea and vomiting   Complete by: As directed    Call MD for:  severe uncontrolled pain   Complete by: As directed    Call MD for:  temperature >100.4   Complete by: As directed    Diet - low sodium heart healthy   Complete by: As directed    Discharge instructions   Complete by: As directed    You were cared for by a hospitalist during your hospital stay. If you have any questions about your discharge medications or the care you received while you were in the hospital after you are discharged, you can call the unit and ask to speak with the hospitalist on call if the hospitalist that took care of you is not available. Once you are discharged, your primary care physician will handle any further medical issues. Please note that NO REFILLS for any discharge medications will be authorized once you are discharged, as it is imperative that you return to your primary care physician (or establish a relationship with a primary care physician if you do not have one) for your aftercare needs so that they can reassess your need for medications and monitor your lab values.   Increase activity slowly   Complete by: As directed       Allergies as of 08/19/2022   No Known Allergies      Medication List     STOP taking these medications    fluconazole 100 MG tablet Commonly known as: DIFLUCAN       TAKE these medications    acetaminophen 500 MG tablet Commonly known as: TYLENOL Take 1,000 mg by mouth every 6 (six) hours as needed for mild pain or headache.   aspirin EC 81 MG tablet Take 1 tablet (81 mg total) by mouth daily.   finasteride 5 MG tablet Commonly known as: PROSCAR Take 5 mg by mouth daily.   fluticasone 50 MCG/ACT nasal spray Commonly known as: FLONASE Place 1 spray into both nostrils daily as needed for allergies or rhinitis.   lidocaine-prilocaine cream Commonly known  as: EMLA Apply to the Port-A-Cath site 30-60 minutes before chemotherapy treatment What changed:  how much to take how to take this when to take this   multivitamin tablet Take 1 tablet by mouth daily with breakfast.   prochlorperazine 10 MG tablet Commonly known as: COMPAZINE Take 1 tablet (10 mg total) by mouth every 6 (six) hours as needed for nausea or vomiting.   rosuvastatin 20 MG tablet Commonly known as: CRESTOR TAKE 1 TABLET BY MOUTH EVERY DAY What changed: when to take this   Systane Ultra PF 0.4-0.3 % Soln Generic drug: Polyethyl Glyc-Propyl Glyc PF Place 1 drop into both eyes 3 (three) times daily as needed (for dryness/irritation).   tamsulosin 0.4 MG Caps capsule Commonly known as: FLOMAX Take 2 capsules (0.8 mg total) by  mouth in the morning and at bedtime for 7 days. What changed: how much to take   Zinc 25 MG Tabs Take 25 mg by mouth daily.        Follow-up Information     Festus Aloe, MD Follow up.   Specialty: Urology Why: Office will call with appointment Contact information: [place] [address] [phone]         Curt Bears, MD Follow up.   Specialty: Oncology Contact information: [place] [address] [phone]                No Known Allergies  Consultations: Urology    Procedures/Studies: CT ABDOMEN PELVIS WO CONTRAST  Result Date: 08/18/2022 CLINICAL DATA:  Abdominal pain, acute, nonlocalized EXAM: CT ABDOMEN AND PELVIS WITHOUT CONTRAST TECHNIQUE: Multidetector CT imaging of the abdomen and pelvis was performed following the standard protocol without IV contrast. RADIATION DOSE REDUCTION: This exam was performed according to the departmental dose-optimization program which includes automated exposure control, adjustment of the mA and/or kV according to patient size and/or use of iterative reconstruction technique. COMPARISON:  PET-CT 07/15/2022 FINDINGS: Lower  chest: Stable small left pleural effusion. Coronary calcifications. Innumerable pulmonary nodules. Largest 2.8 cm in the right middle lobe, previously 2.4 cm. Largest left nodule in the anterior basal segment left lower lobe image 20/3 2.1 cm, previously 1.9. Hepatobiliary: No focal liver abnormality is seen. No gallstones, gallbladder wall thickening, or biliary dilatation. Pancreas: Diffuse parenchymal atrophy without mass or ductal dilatation. No regional inflammatory change. Spleen: Normal in size without focal abnormality. Adrenals/Urinary Tract: No adrenal mass. Left nephrolithiasis, largest stone 3 mm midpole. There is mild bilateral hydronephrosis right greater than left and ureterectasis down to thick-walled urinary bladder, partially decompressed by Foley catheter. Stomach/Bowel: Stomach is nondistended. Small bowel decompressed. Normal appendix. Colon is partially distended by gas and fecal material, with scattered distal descending and sigmoid diverticula; no adjacent inflammatory change. Vascular/Lymphatic: Moderate scattered calcified aortoiliac calcified plaque without aneurysm. Retroaortic left renal vein, anatomic variant. No abdominal or pelvic adenopathy. Reproductive: Prostate enlargement with coarse calcifications Other: No ascites.  No free air. Musculoskeletal: Mild lumbar dextroscoliosis apex L3 with multilevel spondylitic change. Sclerotic lesions in bilateral inferior ischiopubic rami, left sacrum. For no fracture or acute process. IMPRESSION: 1. Mild bilateral hydronephrosis and ureterectasis down to thick-walled urinary bladder, partially decompressed by Foley catheter. 2. Right nephrolithiasis. 3. Innumerable pulmonary nodules, some of which have increased in size. 4. Stable sacral and pelvic bone lesions. 5. Stable small left pleural effusion. 6. Descending and sigmoid diverticulosis. 7.  Aortic Atherosclerosis (ICD10-I70.0). Electronically Signed   By: Lucrezia Europe M.D.   On: 08/18/2022  17:52   IR IMAGING GUIDED PORT INSERTION  Result Date: 08/11/2022 INDICATION: 77 year old male referred for port catheter EXAM: IMAGE GUIDED PORT CATHETER MEDICATIONS: None ANESTHESIA/SEDATION: Moderate (conscious) sedation was employed during this procedure. A total of Versed 2.0 mg and Fentanyl 100 mcg was administered intravenously. Moderate Sedation Time: 18 minutes. The patient's level of consciousness and vital signs were monitored continuously by radiology nursing throughout the procedure under my direct supervision. FLUOROSCOPY TIME:  Fluoroscopy Time:   (1 mGy). COMPLICATIONS: None PROCEDURE: Informed written consent was obtained from the patient after a thorough discussion of the procedural risks, benefits and alternatives. All questions were addressed. Maximal Sterile Barrier Technique was utilized including caps, mask, sterile gowns, sterile gloves, sterile drape, hand hygiene and skin antiseptic. A timeout was performed prior to the initiation of  the procedure. Ultrasound survey was performed with images stored and sent to PACs. Right IJ vein documented to be patent. The right neck and chest was prepped with chlorhexidine, and draped in the usual sterile fashion using maximum barrier technique (cap and mask, sterile gown, sterile gloves, large sterile sheet, hand hygiene and cutaneous antiseptic). Local anesthesia was attained by infiltration with 1% lidocaine without epinephrine. Ultrasound demonstrated patency of the right internal jugular vein, and this was documented with an image. Under real-time ultrasound guidance, this vein was accessed with a 21 gauge micropuncture needle and image documentation was performed. A small dermatotomy was made at the access site with an 11 scalpel. A 0.018" wire was advanced into the SVC and used to estimate the length of the internal catheter. The access needle exchanged for a 46F micropuncture vascular sheath. The 0.018" wire was then removed and a 0.035" wire  advanced into the IVC. An appropriate location for the subcutaneous reservoir was selected below the clavicle and an incision was made through the skin and underlying soft tissues. The subcutaneous tissues were then dissected using a combination of blunt and sharp surgical technique and a pocket was formed. A single lumen power injectable portacatheter was then tunneled through the subcutaneous tissues from the pocket to the dermatotomy and the port reservoir placed within the subcutaneous pocket. The venous access site was then serially dilated and a peel away vascular sheath placed over the wire. The wire was removed and the port catheter advanced into position under fluoroscopic guidance. The catheter tip is positioned in the cavoatrial junction. This was documented with a spot image. The portacatheter was then tested and found to flush and aspirate well. The pocket was then closed in two layers using first subdermal inverted interrupted absorbable sutures followed by a running subcuticular suture. The epidermis was then sealed with Dermabond. Steri-Strips were placed. The dermatotomy at the venous access site was also seal with Dermabond. After completion, the [facility] needle was used to access the port and additional flush was performed to confirm positioning and function. The patient has immediate appointment for use. Patient tolerated the procedure well and remained hemodynamically stable throughout. No complications encountered and no significant blood loss encountered IMPRESSION: Status post right IJ port catheter placement. Signed, Dulcy Fanny. Nadene Rubins, RPVI [facility] Radiology Electronically Signed   By: Corrie Mckusick D.O.   On: 08/11/2022 10:23   MR BRAIN W WO CONTRAST  Result Date: 08/10/2022 CLINICAL DATA:  Non-small-cell lung cancer, staging. EXAM: MRI HEAD WITHOUT AND WITH CONTRAST TECHNIQUE: Multiplanar, multiecho pulse sequences of the brain  and surrounding structures were obtained without and with intravenous contrast. CONTRAST:  90mL GADAVIST GADOBUTROL 1 MMOL/ML IV SOLN COMPARISON:  MRI brain 02/08/2020. FINDINGS: Brain: Unchanged encephalomalacia in the right middle frontal gyrus and right parietal lobe, favored to reflect old infarcts. No acute infarct or intracranial hemorrhage. Single microhemorrhage in the left basal ganglia. No mass or abnormal enhancement. No hydrocephalus or extra-axial collection. Vascular: Normal flow voids. Skull and upper cervical spine: Normal marrow signal and enhancement. Sinuses/Orbits: Unremarkable. Other: None. IMPRESSION: No evidence of metastatic disease in the brain. Electronically Signed   By: Emmit Alexanders M.D.   On: 08/10/2022 10:46       Discharge Exam: Vitals:   08/19/22 0440 08/19/22 0818  BP: 126/74 (!) 147/77  Pulse: 66 67  Resp: 18 20  Temp: 98.8 F (37.1 C) 98.8 F (37.1 C)  SpO2: 97% 97%  General: Pt is alert, awake, not in acute distress Cardiovascular: RRR, S1/S2 +, no edema, +murmur  Respiratory: CTA bilaterally, no wheezing, no rhonchi, no respiratory distress, no conversational dyspnea  Abdominal: Soft, NT, ND, bowel sounds + Extremities: no edema, no cyanosis Psych: Normal mood and affect, stable judgement and insight     The results of significant diagnostics from this hospitalization (including imaging, microbiology, ancillary and laboratory) are listed below for reference.     Microbiology: Recent Results (from the past 240 hour(s))  Culture, blood (routine x 2)     Status: None (Preliminary result)   Collection Time: 08/18/22  6:45 PM   Specimen: BLOOD  Result Value Ref Range Status   Specimen Description   Final    BLOOD RIGHT ANTECUBITAL Performed at [facility] [address], [place], [place] [address]    Special Requests   Final    BOTTLES DRAWN AEROBIC AND ANAEROBIC Blood Culture adequate volume Performed at [facility] [address], [place], [place] [address]    Culture   Final    NO GROWTH < 12 HOURS Performed at [facility] [address], [place], [place] [address]    Report Status PENDING  Incomplete  Culture, blood (routine x 2)     Status: None (Preliminary result)   Collection Time: 08/18/22  6:55 PM   Specimen: BLOOD  Result Value Ref Range Status   Specimen Description   Final    BLOOD LEFT ANTECUBITAL Performed at [facility] [address] [place], [place] [address]    Special Requests   Final    BOTTLES DRAWN AEROBIC AND ANAEROBIC Blood Culture results may not be optimal due to an excessive volume of blood received in culture bottles Performed at [facility] [address] [place], [place] [address]    Culture   Final    NO GROWTH < 12 HOURS Performed at [facility] [address], [place], [place] [address]    Report Status PENDING  Incomplete     Labs: BNP (last 3 results) No results for input(s): "BNP" in the last 8760 hours. Basic Metabolic Panel: Recent Labs  Lab 08/18/22 1335 08/18/22 1653 08/19/22 0500  NA 134* 136 133*  K 5.3* 5.2* 5.0  CL 103 103 104  CO2 22 21* 23  GLUCOSE 109* 88 98  BUN 81* 75* 38*  CREATININE 5.41* 4.89* 1.55*  CALCIUM 8.3* 8.3* 8.4*  MG  --   --  1.9  PHOS  --  5.7* 3.1   Liver Function Tests: Recent Labs  Lab 08/18/22 1335 08/18/22 1653 08/19/22 0500  AST 25 29 27   ALT 44 47* 40  ALKPHOS 104 88 80  BILITOT 0.8 1.2 1.7*  PROT 5.8* 5.8* 5.7*  ALBUMIN 3.4* 3.2* 3.2*   No results for input(s): "LIPASE", "AMYLASE" in the last 168 hours. No results for input(s): "AMMONIA" in the last 168 hours. CBC: Recent Labs  Lab 08/18/22 1335 08/18/22 1653 08/19/22 0500  WBC 0.7* 0.7* 0.5*  NEUTROABS 0.0* 0.0* 0.0*  HGB 10.8* 11.0* 10.4*  HCT 30.4* 31.7* 30.7*  MCV 98.1 101.6* 102.0*  PLT 46* 44* 35*   Cardiac Enzymes: No results for input(s):  "CKTOTAL", "CKMB", "CKMBINDEX", "TROPONINI" in the last 168 hours. BNP: Invalid input(s): "POCBNP" CBG: No results for input(s): "GLUCAP" in the last 168 hours. D-Dimer No results for input(s): "DDIMER" in the last 72 hours. Hgb A1c No  results for input(s): "HGBA1C" in the last 72 hours. Lipid Profile No results for input(s): "CHOL", "HDL", "LDLCALC", "TRIG", "CHOLHDL", "LDLDIRECT" in the last 72 hours. Thyroid function studies No results for input(s): "TSH", "T4TOTAL", "T3FREE", "THYROIDAB" in the last 72 hours.  Invalid input(s): "FREET3" Anemia work up No results for input(s): "VITAMINB12", "FOLATE", "FERRITIN", "TIBC", "IRON", "RETICCTPCT" in the last 72 hours. Urinalysis    Component Value Date/Time   COLORURINE STRAW (A) 08/18/2022 1619   APPEARANCEUR CLEAR 08/18/2022 1619   LABSPEC 1.010 08/18/2022 1619   PHURINE 6.0 08/18/2022 1619   GLUCOSEU NEGATIVE 08/18/2022 1619   HGBUR SMALL (A) 08/18/2022 1619   BILIRUBINUR NEGATIVE 08/18/2022 1619   KETONESUR NEGATIVE 08/18/2022 1619   PROTEINUR NEGATIVE 08/18/2022 1619   NITRITE NEGATIVE 08/18/2022 1619   LEUKOCYTESUR NEGATIVE 08/18/2022 1619   Sepsis Labs Recent Labs  Lab 08/18/22 1335 08/18/22 1653 08/19/22 0500  WBC 0.7* 0.7* 0.5*   Microbiology Recent Results (from the past 240 hour(s))  Culture, blood (routine x 2)     Status: None (Preliminary result)   Collection Time: 08/18/22  6:45 PM   Specimen: BLOOD  Result Value Ref Range Status   Specimen Description   Final    BLOOD RIGHT ANTECUBITAL Performed at [facility], [facility] [address], [place], [place] [address]    Special Requests   Final    BOTTLES DRAWN AEROBIC AND ANAEROBIC Blood Culture adequate volume Performed at [facility] [address] [place], [place] [address]    Culture   Final    NO GROWTH < 12 HOURS Performed at [facility] [address], [place], [place] [address]    Report Status PENDING   Incomplete  Culture, blood (routine x 2)     Status: None (Preliminary result)   Collection Time: 08/18/22  6:55 PM   Specimen: BLOOD  Result Value Ref Range Status   Specimen Description   Final    BLOOD LEFT ANTECUBITAL Performed at [facility] [address], [place], [place] [address]    Special Requests   Final    BOTTLES DRAWN AEROBIC AND ANAEROBIC Blood Culture results may not be optimal due to an excessive volume of blood received in culture bottles Performed at [facility] [address] [place], [place] [address]    Culture   Final    NO GROWTH < 12 HOURS Performed at [facility] [address], [place], [place] [address]    Report Status PENDING  Incomplete     Patient was seen and examined on the day of discharge and was found to be in stable condition. Time coordinating discharge: 45 minutes including assessment and coordination of care, as well as examination of the patient.   SIGNED:  [name], DO [facility] 08/19/2022, 1:04 PM

## 2022-08-19 NOTE — Consult Note (Signed)
Consultation: Metastatic prostate cancer, urinary retention, bilateral hydronephrosis and acute kidney injury Requested by: Dr. [name]  History of Present Illness: Larry Kent is a 77 year old male under my care.  He had lung nodules and underwent lung biopsy 07/28/2022 which showed path of high-grade neuroendocrine cancer possibly prostate.  On eval in the office he had a PSA up to 18 and an abnormal DRE.  This was a drastic change from when he was seen earlier in 2023 with a PSA 1.5 and a normal DRE.  He underwent a 6 core prostate biopsy. A typical biopsy is 12 cores but I did 6 knowing it would give [place] good path result given his abnormal exam and also to limit morbidity.  Unfortunately he had frequency and weak stream prior to the biopsy and more trouble voiding after it.  His creatinine went up to 5.4 and he was admitted to the hospital.  CT scan after Foley catheter placement showed a decompressed bladder and mild bilateral hydronephrosis.  Creatinine is improved to 1.5.  Urine is draining clear.  His August 12, 2022 prostate biopsy showed 6 out of 6 cores with high-grade prostate cancer.  He had grade group 4 disease in 4 cores and grade group 5 disease in 2 cores with a 5 to 95% of the biopsies involved with cancer.  He is already undergoing chemotherapy with Dr. Julien Nordmann. I sent Dr. Julien Nordmann a message and forwarded to him the path results.     Past Medical History:  Diagnosis Date   Allergy    Arthritis    both knees    BCC (basal cell carcinoma), arm, right 09/2019   MOHS ([place])   BCC (basal cell carcinoma), face 2014   L preauricular s/p MOHS   BCC (basal cell carcinoma), face 02/2018   L upper lip   BPH (benign prostatic hypertrophy)    Dr. Junious Silk @ [facility]   Carotid stenosis 73/5329   RICA 92-42%, LICA 6-83%, L vertebral occlusion, rpt 1 yr   Cataract    removed both eyes    FHx: colon cancer    FHx: prostate cancer    Heart murmur    mild aortic stenosis    History  of kidney stones    Hyperlipidemia    borderline- on rosuvastatin now normal    Occlusion of right vertebral artery 05/2015   Past Surgical History:  Procedure Laterality Date   BASAL CELL CARCINOMA EXCISION  05/2018   lip, 2020 x3  basal cells removed    CATARACT EXTRACTION  2008   Left   CATARACT EXTRACTION  2013   Right ([place])   COLONOSCOPY  ~2010   medium int hemorrhoids, o/w WNL, rpt 5 yrs given fmhx (Dr. Oletta Lamas)   COLONOSCOPY  08/2019   TAs, diverticulosis, rpt 3 yrs ([place])   ENDARTERECTOMY Right 12/01/2019   Procedure: RIGHT ENDARTERECTOMY CAROTID;  Surgeon: [name], MD;  Location: [facility];  Service: Vascular;  Laterality: Right;   exercise treadmill  11/2005   WNL [place])   FINE NEEDLE ASPIRATION  07/28/2022   Procedure: FINE NEEDLE ASPIRATION (FNA) LINEAR;  Surgeon: Garner Nash, DO;  Location: [facility];  Service: Pulmonary;;   HERNIA REPAIR  2008   Right   IR IMAGING GUIDED PORT INSERTION  08/11/2022   MOHS SURGERY Left spring 2014   basal cell face   PATCH ANGIOPLASTY Right 12/01/2019   Procedure: PATCH ANGIOPLASTY USING Rueben Bash BIOLOGIC PATCH;  Surgeon: [name], MD;  Location[facility]  OR;  Service: Vascular;  Laterality: Right;   Testicular Biopsy  2003   benign, varicocele   US ECHOCARDIOGRAPHY  11/2005   aortic sclerosis, EF 25-05%, diastolic dysfunction   VIDEO BRONCHOSCOPY WITH ENDOBRONCHIAL ULTRASOUND Bilateral 07/28/2022   Procedure: VIDEO BRONCHOSCOPY WITH ENDOBRONCHIAL ULTRASOUND;  Surgeon: Garner Nash, DO;  Location: [facility];  Service: Pulmonary;  Laterality: Bilateral;    Home Medications:  Medications Prior to Admission  Medication Sig Dispense Refill Last Dose   acetaminophen (TYLENOL) 500 MG tablet Take 1,000 mg by mouth every 6 (six) hours as needed for mild pain or headache.   unk   aspirin EC 81 MG tablet Take 1 tablet (81 mg total) by mouth daily. 90 tablet 3 08/18/2022 at 0800   finasteride (PROSCAR) 5 MG  tablet Take 5 mg by mouth daily.   08/18/2022 at am   fluticasone (FLONASE) 50 MCG/ACT nasal spray Place 1 spray into both nostrils daily as needed for allergies or rhinitis.   unk   lidocaine-prilocaine (EMLA) cream Apply to the Port-A-Cath site 30-60 minutes before chemotherapy treatment (Patient taking differently: Apply 1 Application topically See admin instructions. Apply to the Port-A-Cath site 30-60 minutes before chemotherapy treatment) 30 g 0 unk   Multiple Vitamin (MULTIVITAMIN) tablet Take 1 tablet by mouth daily with breakfast.   08/18/2022   prochlorperazine (COMPAZINE) 10 MG tablet Take 1 tablet (10 mg total) by mouth every 6 (six) hours as needed for nausea or vomiting. 30 tablet 0 unk   rosuvastatin (CRESTOR) 20 MG tablet TAKE 1 TABLET BY MOUTH EVERY DAY (Patient taking differently: Take 20 mg by mouth in the morning.) 90 tablet 1 08/18/2022 at am   SYSTANE ULTRA PF 0.4-0.3 % SOLN Place 1 drop into both eyes 3 (three) times daily as needed (for dryness/irritation).   unk   tamsulosin (FLOMAX) 0.4 MG CAPS capsule Take 0.4 mg by mouth in the morning and at bedtime.   08/18/2022 at am   Zinc 25 MG TABS Take 25 mg by mouth daily.   08/18/2022   fluconazole (DIFLUCAN) 100 MG tablet Take 1 tablet (100 mg total) by mouth daily. (Patient not taking: Reported on 08/18/2022) 10 tablet 0 Not Taking   Allergies: No Known Allergies  Family History  Problem Relation Age of Onset   Cancer Father 87       prostate   Coronary artery disease Father 36       MI   Prostate cancer Father    Cancer Paternal Grandfather 65       prostate   Prostate cancer Paternal Grandfather    Stroke Mother    Hypertension Mother    Cancer Mother 70       colon   Colon cancer Mother    Coronary artery disease Maternal Uncle    Cancer Sister 34       breast   Breast cancer Sister    Colon cancer Maternal Grandmother    Diabetes Neg Hx    Colon polyps Neg Hx    Esophageal cancer Neg Hx    Rectal cancer Neg Hx     Stomach cancer Neg Hx    Social History:  reports that he has never smoked. He has never used smokeless tobacco. He reports that he does not currently use alcohol. He reports that he does not use drugs.  ROS: A complete review of systems was performed.  All systems are negative except for pertinent findings as noted. Review of Systems  All other systems reviewed and are negative.    Physical Exam:  Vital signs in last 24 hours: Temp:  [97.9 F (36.6 C)-98.8 F (37.1 C)] 98.8 F (37.1 C) (02/21 0818) Pulse Rate:  [61-76] 67 (02/21 0818) Resp:  [14-20] 20 (02/21 0818) BP: (126-194)/(74-90) 147/77 (02/21 0818) SpO2:  [94 %-100 %] 97 % (02/21 0818) Weight:  [83.5 kg-85.6 kg] 83.5 kg (02/20 1520) General:  Alert and oriented, No acute distress HEENT: Normocephalic, atraumatic Cardiovascular: Regular rate and rhythm Lungs: Regular rate and effort Abdomen: Soft, nontender, nondistended, no abdominal masses Back: No CVA tenderness Extremities: No edema Neurologic: Grossly intact GU: Foley catheter in place.  Urine draining clear.  Laboratory Data:  Results for orders placed or performed during the hospital encounter of 08/18/22 (from the past 24 hour(s))  Urinalysis, Routine w reflex microscopic -Urine, Clean Catch     Status: Abnormal   Collection Time: 08/18/22  4:19 PM  Result Value Ref Range   Color, Urine STRAW (A) YELLOW   APPearance CLEAR CLEAR   Specific Gravity, Urine 1.010 1.005 - 1.030   pH 6.0 5.0 - 8.0   Glucose, UA NEGATIVE NEGATIVE mg/dL   Hgb urine dipstick SMALL (A) NEGATIVE   Bilirubin Urine NEGATIVE NEGATIVE   Ketones, ur NEGATIVE NEGATIVE mg/dL   Protein, ur NEGATIVE NEGATIVE mg/dL   Nitrite NEGATIVE NEGATIVE   Leukocytes,Ua NEGATIVE NEGATIVE   RBC / HPF 6-10 0 - 5 RBC/hpf   WBC, UA 0-5 0 - 5 WBC/hpf   Bacteria, UA NONE SEEN NONE SEEN   Squamous Epithelial / HPF 0-5 0 - 5 /HPF  Comprehensive metabolic panel     Status: Abnormal   Collection Time:  08/18/22  4:53 PM  Result Value Ref Range   Sodium 136 135 - 145 mmol/L   Potassium 5.2 (H) 3.5 - 5.1 mmol/L   Chloride 103 98 - 111 mmol/L   CO2 21 (L) 22 - 32 mmol/L   Glucose, Bld 88 70 - 99 mg/dL   BUN 75 (H) 8 - 23 mg/dL   Creatinine, Ser 4.89 (H) 0.61 - 1.24 mg/dL   Calcium 8.3 (L) 8.9 - 10.3 mg/dL   Total Protein 5.8 (L) 6.5 - 8.1 g/dL   Albumin 3.2 (L) 3.5 - 5.0 g/dL   AST 29 15 - 41 U/L   ALT 47 (H) 0 - 44 U/L   Alkaline Phosphatase 88 38 - 126 U/L   Total Bilirubin 1.2 0.3 - 1.2 mg/dL   GFR, Estimated 12 (L) >60 mL/min   Anion gap 12 5 - 15  CBC with Differential     Status: Abnormal   Collection Time: 08/18/22  4:53 PM  Result Value Ref Range   WBC 0.7 (LL) 4.0 - 10.5 K/uL   RBC 3.12 (L) 4.22 - 5.81 MIL/uL   Hemoglobin 11.0 (L) 13.0 - 17.0 g/dL   HCT 31.7 (L) 39.0 - 52.0 %   MCV 101.6 (H) 80.0 - 100.0 fL   MCH 35.3 (H) 26.0 - 34.0 pg   MCHC 34.7 30.0 - 36.0 g/dL   RDW 12.9 11.5 - 15.5 %   Platelets 44 (L) 150 - 400 K/uL   nRBC 0.0 0.0 - 0.2 %   Neutrophils Relative % 3 %   Neutro Abs 0.0 (LL) 1.7 - 7.7 K/uL   Lymphocytes Relative 86 %   Lymphs Abs 0.6 (L) 0.7 - 4.0 K/uL   Monocytes Relative 0 %   Monocytes Absolute 0.0 (L) 0.1 - 1.0  K/uL   Eosinophils Relative 10 %   Eosinophils Absolute 0.1 0.0 - 0.5 K/uL   Basophils Relative 1 %   Basophils Absolute 0.0 0.0 - 0.1 K/uL   Immature Granulocytes 0 %   Abs Immature Granulocytes 0.00 0.00 - 0.07 K/uL  Uric acid     Status: None   Collection Time: 08/18/22  4:53 PM  Result Value Ref Range   Uric Acid, Serum 6.3 3.7 - 8.6 mg/dL  Lactate dehydrogenase     Status: Abnormal   Collection Time: 08/18/22  4:53 PM  Result Value Ref Range   LDH 196 (H) 98 - 192 U/L  Phosphorus     Status: Abnormal   Collection Time: 08/18/22  4:53 PM  Result Value Ref Range   Phosphorus 5.7 (H) 2.5 - 4.6 mg/dL  Culture, blood (routine x 2)     Status: None (Preliminary result)   Collection Time: 08/18/22  6:45 PM   Specimen:  BLOOD  Result Value Ref Range   Specimen Description      BLOOD RIGHT ANTECUBITAL Performed at [facility], [place] [address] [place], [place] [address]    Special Requests      BOTTLES DRAWN AEROBIC AND ANAEROBIC Blood Culture adequate volume Performed at [facility] [address], [place], [place] [address]    Culture      NO GROWTH < 12 HOURS Performed at [facility] [address], [place], [place] [address]    Report Status PENDING   Culture, blood (routine x 2)     Status: None (Preliminary result)   Collection Time: 08/18/22  6:55 PM   Specimen: BLOOD  Result Value Ref Range   Specimen Description      BLOOD LEFT ANTECUBITAL Performed at [facility] [address] [place], [place] [address]    Special Requests      BOTTLES DRAWN AEROBIC AND ANAEROBIC Blood Culture results may not be optimal due to an excessive volume of blood received in culture bottles Performed at [facility] [address], [place], [place] [address]    Culture      NO GROWTH < 12 HOURS Performed at [facility] [address] [place], [place] [address]    Report Status PENDING   CBC with Differential/Platelet     Status: Abnormal   Collection Time: 08/19/22  5:00 AM  Result Value Ref Range   WBC 0.5 (LL) 4.0 - 10.5 K/uL   RBC 3.01 (L) 4.22 - 5.81 MIL/uL   Hemoglobin 10.4 (L) 13.0 - 17.0 g/dL   HCT 30.7 (L) 39.0 - 52.0 %   MCV 102.0 (H) 80.0 - 100.0 fL   MCH 34.6 (H) 26.0 - 34.0 pg   MCHC 33.9 30.0 - 36.0 g/dL   RDW 12.7 11.5 - 15.5 %   Platelets 35 (L) 150 - 400 K/uL   nRBC 0.0 0.0 - 0.2 %   Neutrophils Relative % 4 %   Neutro Abs 0.0 (LL) 1.7 - 7.7 K/uL   Lymphocytes Relative 80 %   Lymphs Abs 0.4 (L) 0.7 - 4.0 K/uL   Monocytes Relative 2 %   Monocytes Absolute 0.0 (L) 0.1 - 1.0 K/uL   Eosinophils Relative 12 %   Eosinophils Absolute 0.1 0.0 - 0.5 K/uL   Basophils Relative 2 %    Basophils Absolute 0.0 0.0 - 0.1 K/uL   Immature Granulocytes 0 %   Abs Immature Granulocytes  0.00 0.00 - 0.07 K/uL   Reactive, Benign Lymphocytes PRESENT   Comprehensive metabolic panel     Status: Abnormal   Collection Time: 08/19/22  5:00 AM  Result Value Ref Range   Sodium 133 (L) 135 - 145 mmol/L   Potassium 5.0 3.5 - 5.1 mmol/L   Chloride 104 98 - 111 mmol/L   CO2 23 22 - 32 mmol/L   Glucose, Bld 98 70 - 99 mg/dL   BUN 38 (H) 8 - 23 mg/dL   Creatinine, Ser 1.55 (H) 0.61 - 1.24 mg/dL   Calcium 8.4 (L) 8.9 - 10.3 mg/dL   Total Protein 5.7 (L) 6.5 - 8.1 g/dL   Albumin 3.2 (L) 3.5 - 5.0 g/dL   AST 27 15 - 41 U/L   ALT 40 0 - 44 U/L   Alkaline Phosphatase 80 38 - 126 U/L   Total Bilirubin 1.7 (H) 0.3 - 1.2 mg/dL   GFR, Estimated 46 (L) >60 mL/min   Anion gap 6 5 - 15  Magnesium     Status: None   Collection Time: 08/19/22  5:00 AM  Result Value Ref Range   Magnesium 1.9 1.7 - 2.4 mg/dL  Phosphorus     Status: None   Collection Time: 08/19/22  5:00 AM  Result Value Ref Range   Phosphorus 3.1 2.5 - 4.6 mg/dL   Recent Results (from the past 240 hour(s))  Culture, blood (routine x 2)     Status: None (Preliminary result)   Collection Time: 08/18/22  6:45 PM   Specimen: BLOOD  Result Value Ref Range Status   Specimen Description   Final    BLOOD RIGHT ANTECUBITAL Performed at [facility], [place] [address], [place], [place] [address]    Special Requests   Final    BOTTLES DRAWN AEROBIC AND ANAEROBIC Blood Culture adequate volume Performed at [facility] [address], [place], [place] [address]    Culture   Final    NO GROWTH < 12 HOURS Performed at [facility] [address] [place], [place] [address]    Report Status PENDING  Incomplete  Culture, blood (routine x 2)     Status: None (Preliminary result)   Collection Time: 08/18/22  6:55 PM   Specimen: BLOOD  Result Value Ref Range Status   Specimen Description   Final     BLOOD LEFT ANTECUBITAL Performed at [facility] [address], [place], [place] [address]    Special Requests   Final    BOTTLES DRAWN AEROBIC AND ANAEROBIC Blood Culture results may not be optimal due to an excessive volume of blood received in culture bottles Performed at [facility] [address] [place], [place] [address]    Culture   Final    NO GROWTH < 12 HOURS Performed at [facility] [address] [place],  [address]    Report Status PENDING  Incomplete   Creatinine: Recent Labs    08/18/22 1335 08/18/22 1653 08/19/22 0500  CREATININE 5.41* 4.89* 1.55*    Impression/Assessment/plan:  1) bilateral hydronephrosis acute kidney injury-likely from urinary retention.  Resolving with Foley catheter.  2) urinary retention-likely from BPH and prostate cancer-he reports my office already called and has him set up for next Monday for voiding trial.  I doubled up his tamsulosin.  Discussed he may need a TURP or channel TURP especially if his urinary retention continues to affect his kidney  function.  3) metastatic prostate cancer-I discussed with the patient his stage, grade and prognosis using his path report as a guide.  I sent a message to Dr. Earlie Server and the path report.  Will discuss with him adding ADT and/or androgen receptor blocker or androgen synthesis inhibitors.   Festus Aloe 08/19/2022, 12:19 PM

## 2022-08-19 NOTE — Progress Notes (Signed)
Mobility Specialist - Progress Note   08/19/22 1010  Mobility  Activity Moved into chair position in bed  Level of Assistance Independent after set-up  Range of Motion/Exercises Active  Activity Response Tolerated well  Mobility Referral Yes  $Mobility charge 1 Mobility   Pt received in bed declining mobility, but requesting assistance w/ ADLs. Moved pt into seated position in bed to assist in brushing teeth. Pt had no complaints. Nurse in room at the time of session. Pt to bed after session w/ all needs met.   [facility]

## 2022-08-20 ENCOUNTER — Other Ambulatory Visit: Payer: Self-pay

## 2022-08-20 ENCOUNTER — Encounter: Payer: Self-pay | Admitting: Family Medicine

## 2022-08-20 DIAGNOSIS — C61 Malignant neoplasm of prostate: Secondary | ICD-10-CM | POA: Insufficient documentation

## 2022-08-20 LAB — CBC WITH DIFFERENTIAL/PLATELET
Abs Immature Granulocytes: 0 10*3/uL (ref 0.00–0.07)
Basophils Absolute: 0 10*3/uL (ref 0.0–0.1)
Basophils Relative: 2 %
Eosinophils Absolute: 0.1 10*3/uL (ref 0.0–0.5)
Eosinophils Relative: 12 %
HCT: 30.7 % — ABNORMAL LOW (ref 39.0–52.0)
Hemoglobin: 10.4 g/dL — ABNORMAL LOW (ref 13.0–17.0)
Immature Granulocytes: 0 %
Lymphocytes Relative: 80 %
Lymphs Abs: 0.4 10*3/uL — ABNORMAL LOW (ref 0.7–4.0)
MCH: 34.6 pg — ABNORMAL HIGH (ref 26.0–34.0)
MCHC: 33.9 g/dL (ref 30.0–36.0)
MCV: 102 fL — ABNORMAL HIGH (ref 80.0–100.0)
Monocytes Absolute: 0 10*3/uL — ABNORMAL LOW (ref 0.1–1.0)
Monocytes Relative: 2 %
Neutro Abs: 0 10*3/uL — CL (ref 1.7–7.7)
Neutrophils Relative %: 4 %
Platelets: 35 10*3/uL — ABNORMAL LOW (ref 150–400)
RBC: 3.01 MIL/uL — ABNORMAL LOW (ref 4.22–5.81)
RDW: 12.7 % (ref 11.5–15.5)
WBC: 0.5 10*3/uL — CL (ref 4.0–10.5)
nRBC: 0 % (ref 0.0–0.2)

## 2022-08-20 NOTE — Progress Notes (Signed)
Pt left VM asking questions about his medications. When the navigator returned the pt's phone call. He had several questions about medications he supposed to or not supposed to take while he is on chemotherapy. Pt was recently discharged from the hospital. Pt was seen by urology and they put him on flomax. Pt wasn't sure if he should cut back on his fluid intake while he is on it. Pt has a catheter in place and is going to see his urologist, Dr Junious Silk, next week to have it removed. Navigator advised the pt to not alter his intake unless it was suggested by his urologist. His urologist's office has a nurse advice line and navigator urged him to call them to clarify that issue for him. He also said that Dr Julien Nordmann mentioned stopping his daily aspirin. Navigator did not see that documentation in his notes. Navigator told the pt not to alter his regimen until it's been clarified with Dr.Mohamed. Pt also asked about the claritin he is on. I explained that the claritin is for the neulasta injection he takes to help reduce the pain it can cause.  Pt states he will call his urologists office for advice about his hydration. Pt will continue taking the claritin for now. Navigator will discuss his aspirin Rx with dr Julien Nordmann and touch base with the pt next week. Pt verbalized understanding.

## 2022-08-21 ENCOUNTER — Other Ambulatory Visit: Payer: Self-pay | Admitting: Physician Assistant

## 2022-08-21 ENCOUNTER — Ambulatory Visit (HOSPITAL_BASED_OUTPATIENT_CLINIC_OR_DEPARTMENT_OTHER): Payer: Medicare Other | Admitting: Physician Assistant

## 2022-08-21 ENCOUNTER — Encounter (HOSPITAL_COMMUNITY): Payer: Self-pay

## 2022-08-21 ENCOUNTER — Emergency Department (HOSPITAL_COMMUNITY): Payer: Medicare Other

## 2022-08-21 ENCOUNTER — Other Ambulatory Visit: Payer: Self-pay

## 2022-08-21 ENCOUNTER — Inpatient Hospital Stay: Payer: Medicare Other

## 2022-08-21 ENCOUNTER — Telehealth: Payer: Self-pay | Admitting: *Deleted

## 2022-08-21 ENCOUNTER — Inpatient Hospital Stay (HOSPITAL_COMMUNITY)
Admission: EM | Admit: 2022-08-21 | Discharge: 2022-08-24 | DRG: 809 | Disposition: A | Discharge status: Home / Self Care | Payer: Medicare Other | Attending: Internal Medicine | Admitting: Internal Medicine

## 2022-08-21 DIAGNOSIS — Z803 Family history of malignant neoplasm of breast: Secondary | ICD-10-CM

## 2022-08-21 DIAGNOSIS — C3490 Malignant neoplasm of unspecified part of unspecified bronchus or lung: Secondary | ICD-10-CM

## 2022-08-21 DIAGNOSIS — C61 Malignant neoplasm of prostate: Secondary | ICD-10-CM | POA: Diagnosis present

## 2022-08-21 DIAGNOSIS — X58XXXA Exposure to other specified factors, initial encounter: Secondary | ICD-10-CM | POA: Diagnosis present

## 2022-08-21 DIAGNOSIS — Z1152 Encounter for screening for COVID-19: Secondary | ICD-10-CM

## 2022-08-21 DIAGNOSIS — T451X5A Adverse effect of antineoplastic and immunosuppressive drugs, initial encounter: Secondary | ICD-10-CM | POA: Diagnosis present

## 2022-08-21 DIAGNOSIS — C7A1 Malignant poorly differentiated neuroendocrine tumors: Secondary | ICD-10-CM

## 2022-08-21 DIAGNOSIS — Z87442 Personal history of urinary calculi: Secondary | ICD-10-CM

## 2022-08-21 DIAGNOSIS — R040 Epistaxis: Secondary | ICD-10-CM

## 2022-08-21 DIAGNOSIS — D61818 Other pancytopenia: Secondary | ICD-10-CM

## 2022-08-21 DIAGNOSIS — Z961 Presence of intraocular lens: Secondary | ICD-10-CM | POA: Diagnosis present

## 2022-08-21 DIAGNOSIS — Z95828 Presence of other vascular implants and grafts: Secondary | ICD-10-CM

## 2022-08-21 DIAGNOSIS — Z8249 Family history of ischemic heart disease and other diseases of the circulatory system: Secondary | ICD-10-CM

## 2022-08-21 DIAGNOSIS — Z800 Family history of malignant neoplasm of digestive organs: Secondary | ICD-10-CM

## 2022-08-21 DIAGNOSIS — K5900 Constipation, unspecified: Secondary | ICD-10-CM | POA: Diagnosis present

## 2022-08-21 DIAGNOSIS — D696 Thrombocytopenia, unspecified: Secondary | ICD-10-CM | POA: Diagnosis present

## 2022-08-21 DIAGNOSIS — D709 Neutropenia, unspecified: Secondary | ICD-10-CM

## 2022-08-21 DIAGNOSIS — E871 Hypo-osmolality and hyponatremia: Secondary | ICD-10-CM | POA: Diagnosis present

## 2022-08-21 DIAGNOSIS — Z9841 Cataract extraction status, right eye: Secondary | ICD-10-CM

## 2022-08-21 DIAGNOSIS — C7B8 Other secondary neuroendocrine tumors: Secondary | ICD-10-CM | POA: Diagnosis present

## 2022-08-21 DIAGNOSIS — R339 Retention of urine, unspecified: Secondary | ICD-10-CM | POA: Diagnosis present

## 2022-08-21 DIAGNOSIS — E785 Hyperlipidemia, unspecified: Secondary | ICD-10-CM | POA: Diagnosis present

## 2022-08-21 DIAGNOSIS — E861 Hypovolemia: Secondary | ICD-10-CM | POA: Diagnosis present

## 2022-08-21 DIAGNOSIS — Z79899 Other long term (current) drug therapy: Secondary | ICD-10-CM

## 2022-08-21 DIAGNOSIS — Z85828 Personal history of other malignant neoplasm of skin: Secondary | ICD-10-CM

## 2022-08-21 DIAGNOSIS — Z8042 Family history of malignant neoplasm of prostate: Secondary | ICD-10-CM

## 2022-08-21 DIAGNOSIS — D61810 Antineoplastic chemotherapy induced pancytopenia: Secondary | ICD-10-CM | POA: Diagnosis present

## 2022-08-21 DIAGNOSIS — Z823 Family history of stroke: Secondary | ICD-10-CM

## 2022-08-21 DIAGNOSIS — R5081 Fever presenting with conditions classified elsewhere: Secondary | ICD-10-CM | POA: Diagnosis present

## 2022-08-21 DIAGNOSIS — R338 Other retention of urine: Secondary | ICD-10-CM | POA: Diagnosis present

## 2022-08-21 DIAGNOSIS — Z9842 Cataract extraction status, left eye: Secondary | ICD-10-CM

## 2022-08-21 LAB — CBC WITH DIFFERENTIAL (CANCER CENTER ONLY)
Abs Immature Granulocytes: 0 10*3/uL (ref 0.00–0.07)
Basophils Absolute: 0 10*3/uL (ref 0.0–0.1)
Basophils Relative: 2 %
Eosinophils Absolute: 0 10*3/uL (ref 0.0–0.5)
Eosinophils Relative: 3 %
HCT: 29.6 % — ABNORMAL LOW (ref 39.0–52.0)
Hemoglobin: 10.6 g/dL — ABNORMAL LOW (ref 13.0–17.0)
Immature Granulocytes: 0 %
Lymphocytes Relative: 71 %
Lymphs Abs: 0.4 10*3/uL — ABNORMAL LOW (ref 0.7–4.0)
MCH: 35.2 pg — ABNORMAL HIGH (ref 26.0–34.0)
MCHC: 35.8 g/dL (ref 30.0–36.0)
MCV: 98.3 fL (ref 80.0–100.0)
Monocytes Absolute: 0 10*3/uL — ABNORMAL LOW (ref 0.1–1.0)
Monocytes Relative: 7 %
Neutro Abs: 0.1 10*3/uL — CL (ref 1.7–7.7)
Neutrophils Relative %: 17 %
Platelet Count: 21 10*3/uL — ABNORMAL LOW (ref 150–400)
RBC: 3.01 MIL/uL — ABNORMAL LOW (ref 4.22–5.81)
RDW: 12.1 % (ref 11.5–15.5)
WBC Count: 0.6 10*3/uL — CL (ref 4.0–10.5)
nRBC: 0 % (ref 0.0–0.2)

## 2022-08-21 LAB — ABO/RH
ABO/RH(D): O POS
ABO/RH(D): O POS

## 2022-08-21 LAB — I-STAT CHEM 8, ED
BUN: 27 mg/dL — ABNORMAL HIGH (ref 8–23)
Calcium, Ion: 1.19 mmol/L (ref 1.15–1.40)
Chloride: 101 mmol/L (ref 98–111)
Creatinine, Ser: 0.9 mg/dL (ref 0.61–1.24)
Glucose, Bld: 150 mg/dL — ABNORMAL HIGH (ref 70–99)
HCT: 29 % — ABNORMAL LOW (ref 39.0–52.0)
Hemoglobin: 9.9 g/dL — ABNORMAL LOW (ref 13.0–17.0)
Potassium: 4.3 mmol/L (ref 3.5–5.1)
Sodium: 133 mmol/L — ABNORMAL LOW (ref 135–145)
TCO2: 22 mmol/L (ref 22–32)

## 2022-08-21 LAB — URIC ACID: Uric Acid, Serum: 3.7 mg/dL (ref 3.7–8.6)

## 2022-08-21 LAB — SAMPLE TO BLOOD BANK

## 2022-08-21 MED ORDER — HEPARIN SOD (PORK) LOCK FLUSH 100 UNIT/ML IV SOLN
500.0000 [IU] | Freq: Once | INTRAVENOUS | Status: AC
  Administered 2022-08-21: 500 [IU]

## 2022-08-21 MED ORDER — LACTATED RINGERS IV BOLUS (SEPSIS)
1000.0000 mL | Freq: Once | INTRAVENOUS | Status: AC
  Administered 2022-08-22: 1000 mL via INTRAVENOUS

## 2022-08-21 MED ORDER — SODIUM CHLORIDE 0.9 % IV SOLN
2.0000 g | Freq: Once | INTRAVENOUS | Status: AC
  Administered 2022-08-22: 2 g via INTRAVENOUS
  Filled 2022-08-21: qty 12.5

## 2022-08-21 MED ORDER — SODIUM CHLORIDE 0.9% FLUSH
10.0000 mL | Freq: Once | INTRAVENOUS | Status: AC
  Administered 2022-08-21: 10 mL

## 2022-08-21 NOTE — ED Triage Notes (Signed)
Cancer pt undergoing treatment and with recent admission for AKI.  Has foley catheter that he states is "burning"  Pt was to have transfusion of platelets tomorrow morning and had labs today for same.  Pt reports temp of 101 at home.  "Flushed feeling"

## 2022-08-21 NOTE — ED Provider Notes (Signed)
[facility] AT [facility] Provider Note   CSN: [id number] Arrival date & time: 08/21/22  2315     History {Add pertinent medical, surgical, social history, OB history to HPI:1} Chief Complaint  Patient presents with   Fever    Larry Kent is a 77 y.o. male.  The history is provided by the patient, a relative and medical records.  Fever Larry Kent is a 77 y.o. male who presents to the Emergency Department complaining of fever.  He presents to the emergency department for evaluation of fever today.  This morning he had a temperature of 99.7 but this afternoon it increased to 101.7.  He did not take anything for the fever.  He states he feels flushed and feverish.  He also has some penile pain secondary to recent Foley catheter placement.  Otherwise he is eating and drinking well.  No nausea, vomiting, cough, difficulty breathing.  He did have a spontaneous nosebleed last night from the left nare.  He does have constipation.  He has a complicated medical history with recent diagnosis of neuroendocrine tumor of the lung and started chemotherapy on February 13.  He also had a prostate biopsy on February 14 that is concerning for high-grade prostate cancer.  He had a Foley catheter placed due to bladder outlet obstruction on February 20 with acute renal failure.  He was hydrated and discharged from the hospital on February 21.  He did receive a call from oncology today with low platelets with plan for platelet transfusion tomorrow.     Home Medications Prior to Admission medications   Medication Sig Start Date End Date Taking? Authorizing Provider  acetaminophen (TYLENOL) 500 MG tablet Take 1,000 mg by mouth every 6 (six) hours as needed for mild pain or headache.    [provider]  aspirin EC 81 MG tablet Take 1 tablet (81 mg total) by mouth daily. 08/02/18   Wellington Hampshire, MD  finasteride (PROSCAR) 5 MG tablet Take 5 mg by mouth daily.  07/22/22   [provider]  fluticasone (FLONASE) 50 MCG/ACT nasal spray Place 1 spray into both nostrils daily as needed for allergies or rhinitis.    [provider]  lidocaine-prilocaine (EMLA) cream Apply to the Port-A-Cath site 30-60 minutes before chemotherapy treatment Patient taking differently: Apply 1 Application topically See admin instructions. Apply to the Port-A-Cath site 30-60 minutes before chemotherapy treatment 08/03/22   Curt Bears, MD  Multiple Vitamin (MULTIVITAMIN) tablet Take 1 tablet by mouth daily with breakfast.    [provider]  prochlorperazine (COMPAZINE) 10 MG tablet Take 1 tablet (10 mg total) by mouth every 6 (six) hours as needed for nausea or vomiting. 08/03/22   Curt Bears, MD  rosuvastatin (CRESTOR) 20 MG tablet TAKE 1 TABLET BY MOUTH EVERY DAY Patient taking differently: Take 20 mg by mouth in the morning. 04/28/22   Ria Bush, MD  SYSTANE ULTRA PF 0.4-0.3 % SOLN Place 1 drop into both eyes 3 (three) times daily as needed (for dryness/irritation).    [provider]  tamsulosin (FLOMAX) 0.4 MG CAPS capsule Take 2 capsules (0.8 mg total) by mouth in the morning and at bedtime for 7 days. 08/19/22 08/26/22  [name], DO  Zinc 25 MG TABS Take 25 mg by mouth daily.    [provider]      Allergies    Patient has no known allergies.    Review of Systems   Review of  Systems  Constitutional:  Positive for fever.  All other systems reviewed and are negative.   Physical Exam Updated Vital Signs BP (!) 130/91 (BP Location: Right Arm)   Pulse (!) 101   Temp 99.4 F (37.4 C) (Oral)   Resp 18   SpO2 97%  Physical Exam Vitals and nursing note reviewed.  Constitutional:      Appearance: He is well-developed.  HENT:     Head: Normocephalic and atraumatic.     Comments: Dried blood in the left nare. Cardiovascular:     Rate and Rhythm: Regular rhythm. Tachycardia present.     Heart sounds: No  murmur heard. Pulmonary:     Effort: Pulmonary effort is normal. No respiratory distress.     Comments: Decreased air movement bilaterally Abdominal:     Palpations: Abdomen is soft.     Tenderness: There is no abdominal tenderness. There is no guarding or rebound.  Musculoskeletal:        General: No tenderness.  Skin:    General: Skin is warm and dry.  Neurological:     Mental Status: He is alert and oriented to person, place, and time.  Psychiatric:        Behavior: Behavior normal.     ED Results / Procedures / Treatments   Labs (all labs ordered are listed, but only abnormal results are displayed) Labs Reviewed  CULTURE, BLOOD (ROUTINE X 2)  CULTURE, BLOOD (ROUTINE X 2)  URINE CULTURE  LACTIC ACID, PLASMA  LACTIC ACID, PLASMA  COMPREHENSIVE METABOLIC PANEL  CBC WITH DIFFERENTIAL/PLATELET  PROTIME-INR  APTT  URINALYSIS, ROUTINE W REFLEX MICROSCOPIC  I-STAT CHEM 8, ED    EKG None  Radiology No results found.  Procedures Procedures  {Document cardiac monitor, telemetry assessment procedure when appropriate:1}  Medications Ordered in ED Medications  lactated ringers bolus 1,000 mL (has no administration in time range)    ED Course/ Medical Decision Making/ A&P   {   Click here for ABCD2, HEART and other calculatorsREFRESH Note before signing :1}                          Medical Decision Making Amount and/or Complexity of Data Reviewed Labs: ordered.   ***  {Document critical care time when appropriate:1} {Document review of labs and clinical decision tools ie heart score, Chads2Vasc2 etc:1}  {Document your independent review of radiology images, and any outside records:1} {Document your discussion with family members, caretakers, and with consultants:1} {Document social determinants of health affecting pt's care:1} {Document your decision making why or why not admission, treatments were needed:1} Final Clinical Impression(s) / ED Diagnoses Final  diagnoses:  None    Rx / DC Orders ED Discharge Orders     None

## 2022-08-21 NOTE — Telephone Encounter (Signed)
CRITICAL VALUE STICKER  CRITICAL VALUE: WBC 0.6, ANC 0.1  RECEIVER (on-site recipient of call): Velna Ochs RN  DATE & TIME NOTIFIED: 08/21/22 @ 1415  MESSENGER (representative from lab): Pam  MD NOTIFIED: C. Heilingoetter PA  TIME OF NOTIFICATION: [id number]  RESPONSE:  PA aware

## 2022-08-21 NOTE — Progress Notes (Signed)
Patient had called the navigator asking about the lab work he had one prior to being discharged from the hospital and wanted to know if anyone had any plans to call him about it. He was concerned that the shot (neulasta) wasn't working because his WBCs were still low. Pt's first dose of chemotherapy was 2/13, and he received his neulasta injection on 2/17. Navigator conferred with Cassie Heilingoetter, Dr.Mohamed's PA. Navigator relayed information provided to her by PA Heilingoetter, that this is the period of time when his WBCs would be very low, around 10-14days after treatment, and the injection should start working. Pt is due to have his weekly labs drawn on Monday 2/26.  Pt also states that he did have a slightly elevated temperature this morning around 10am of 99.7 for which he took tylenol. I advised pt that he should not take tylenol, ibuprofen, or naproxen, all of which can mask a true fever. Pt verbalized understanding and he will continue to monitor his temperature and not take anything to help lower it. Pt also mentioned that he had a nosebleed last night that "lasted about an hour". This was also relayed to PA Heilingoetter who advised the pt to come in today for labs. Pt was agreeable to this suggestion and states he will be in around 2pm. PA Heilingoetters notified and labs were ordered.

## 2022-08-21 NOTE — Progress Notes (Signed)
In summary, this is a 77 year old male who was recently diagnosed with poorly differentiated carcinoma, neuroendocrine carcinoma.  Of note, the patient recently had a prostate biopsy which is consistent with prostate cancer, adenocarcinoma as well.  He underwent his first cycle of chemotherapy on 08/11/2022 for the neuroendocrine carcinoma.  The Neulasta injection on 08/15/22. When he was seen in the clinic on 08/18/2022 for 1 week follow-up visit, the patient had significant neutropenia with a total white blood cell count of 0.7 and an ANC of 0.0.  He also had an acute kidney injury with a creatinine of 5.41 (baseline 0.82).  He was subsequently sent emergency room and was admitted overnight from 08/18/22-08/19/22.  He was discharged from the hospital, the patient still had significant neutropenia (ANC 0.0) and thrombocytopenia with a platelet count of 35,000k.  He had had improvement in his creatinine to 1.55.  The patient had called earlier today (08/21/22) and talk to a member of my nursing staff.  He had questions about some his medications.  He had mentioned that he had an hour-long nosebleed last night.  Therefore, the patient was brought into the clinic today for repeat lab work.   Unfortunately, the patient continues to have significant cytopenias on labs today.  Despite receiving the Neulasta injection 1 week ago he continues to have neutropenia with a total white blood cell count of 0.6 and ANC of 0.1.  He also has worsening platelet count at 21,000.  His hemoglobin is 10.6 today.  As we are approaching the weekend, in the setting of the patient having a prolonged nosebleed last night, we will arrange for the patient to receive 1 unit of platelets tomorrow at 10 AM.  I am concerned that he may end up in the emergency room over the weekend with bleeding if he does not receive platelet transfusion if his number worsens.  He was also instructed to discontinue taking his aspirin until his platelet count is  over 50,000.  I also reviewed neutropenic precautions with the patient if he develops any signs and symptoms of infection he will need to be emergently evaluated.  Additionally, I mention to the patient that should he develop any prolonged bleeding tonight before he is able to receive his platelet transfusion tomorrow, he was advised to seek emergency evaluation.  When Dr. Julien Nordmann returns to the clinic and prior to cycle #2, we will have to have a discussion about modifications to his care plan due to the significant cytopenias after just 1 cycle of chemotherapy.  He is not due for cycle #2 until 09/01/2022. I will talk to Dr. Julien Nordmann about his treatment next week.   The patient is aware to keep the blue bracelet on for his blood products tomorrow.

## 2022-08-22 ENCOUNTER — Other Ambulatory Visit: Payer: Self-pay

## 2022-08-22 ENCOUNTER — Encounter (HOSPITAL_COMMUNITY): Payer: Self-pay | Admitting: Family Medicine

## 2022-08-22 DIAGNOSIS — X58XXXA Exposure to other specified factors, initial encounter: Secondary | ICD-10-CM | POA: Diagnosis present

## 2022-08-22 DIAGNOSIS — Z9841 Cataract extraction status, right eye: Secondary | ICD-10-CM | POA: Diagnosis not present

## 2022-08-22 DIAGNOSIS — C7B8 Other secondary neuroendocrine tumors: Secondary | ICD-10-CM | POA: Diagnosis present

## 2022-08-22 DIAGNOSIS — Z8249 Family history of ischemic heart disease and other diseases of the circulatory system: Secondary | ICD-10-CM | POA: Diagnosis not present

## 2022-08-22 DIAGNOSIS — D696 Thrombocytopenia, unspecified: Secondary | ICD-10-CM

## 2022-08-22 DIAGNOSIS — E785 Hyperlipidemia, unspecified: Secondary | ICD-10-CM | POA: Diagnosis present

## 2022-08-22 DIAGNOSIS — C61 Malignant neoplasm of prostate: Secondary | ICD-10-CM

## 2022-08-22 DIAGNOSIS — E861 Hypovolemia: Secondary | ICD-10-CM | POA: Diagnosis present

## 2022-08-22 DIAGNOSIS — K5900 Constipation, unspecified: Secondary | ICD-10-CM | POA: Diagnosis present

## 2022-08-22 DIAGNOSIS — Z9842 Cataract extraction status, left eye: Secondary | ICD-10-CM | POA: Diagnosis not present

## 2022-08-22 DIAGNOSIS — Z800 Family history of malignant neoplasm of digestive organs: Secondary | ICD-10-CM | POA: Diagnosis not present

## 2022-08-22 DIAGNOSIS — Z85828 Personal history of other malignant neoplasm of skin: Secondary | ICD-10-CM | POA: Diagnosis not present

## 2022-08-22 DIAGNOSIS — R5081 Fever presenting with conditions classified elsewhere: Secondary | ICD-10-CM | POA: Diagnosis present

## 2022-08-22 DIAGNOSIS — D61810 Antineoplastic chemotherapy induced pancytopenia: Secondary | ICD-10-CM | POA: Diagnosis present

## 2022-08-22 DIAGNOSIS — D709 Neutropenia, unspecified: Secondary | ICD-10-CM | POA: Diagnosis not present

## 2022-08-22 DIAGNOSIS — C3490 Malignant neoplasm of unspecified part of unspecified bronchus or lung: Secondary | ICD-10-CM

## 2022-08-22 DIAGNOSIS — R040 Epistaxis: Secondary | ICD-10-CM | POA: Diagnosis present

## 2022-08-22 DIAGNOSIS — Z823 Family history of stroke: Secondary | ICD-10-CM | POA: Diagnosis not present

## 2022-08-22 DIAGNOSIS — C7A1 Malignant poorly differentiated neuroendocrine tumors: Secondary | ICD-10-CM | POA: Diagnosis present

## 2022-08-22 DIAGNOSIS — R339 Retention of urine, unspecified: Secondary | ICD-10-CM | POA: Diagnosis present

## 2022-08-22 DIAGNOSIS — Z1152 Encounter for screening for COVID-19: Secondary | ICD-10-CM | POA: Diagnosis not present

## 2022-08-22 DIAGNOSIS — E871 Hypo-osmolality and hyponatremia: Secondary | ICD-10-CM | POA: Diagnosis present

## 2022-08-22 DIAGNOSIS — T451X5A Adverse effect of antineoplastic and immunosuppressive drugs, initial encounter: Secondary | ICD-10-CM | POA: Diagnosis present

## 2022-08-22 DIAGNOSIS — Z8042 Family history of malignant neoplasm of prostate: Secondary | ICD-10-CM | POA: Diagnosis not present

## 2022-08-22 DIAGNOSIS — Z961 Presence of intraocular lens: Secondary | ICD-10-CM | POA: Diagnosis present

## 2022-08-22 DIAGNOSIS — Z803 Family history of malignant neoplasm of breast: Secondary | ICD-10-CM | POA: Diagnosis not present

## 2022-08-22 DIAGNOSIS — Z87442 Personal history of urinary calculi: Secondary | ICD-10-CM | POA: Diagnosis not present

## 2022-08-22 LAB — CBC WITH DIFFERENTIAL/PLATELET
Abs Immature Granulocytes: 0 10*3/uL (ref 0.00–0.07)
Abs Immature Granulocytes: 0.01 10*3/uL (ref 0.00–0.07)
Abs Immature Granulocytes: 0.01 10*3/uL (ref 0.00–0.07)
Basophils Absolute: 0 10*3/uL (ref 0.0–0.1)
Basophils Absolute: 0 10*3/uL (ref 0.0–0.1)
Basophils Absolute: 0 10*3/uL (ref 0.0–0.1)
Basophils Relative: 3 %
Basophils Relative: 2 %
Basophils Relative: 2 %
Eosinophils Absolute: 0 10*3/uL (ref 0.0–0.5)
Eosinophils Absolute: 0 10*3/uL (ref 0.0–0.5)
Eosinophils Absolute: 0 10*3/uL (ref 0.0–0.5)
Eosinophils Relative: 3 %
Eosinophils Relative: 2 %
Eosinophils Relative: 3 %
HCT: 28.8 % — ABNORMAL LOW (ref 39.0–52.0)
HCT: 25.4 % — ABNORMAL LOW (ref 39.0–52.0)
HCT: 25.3 % — ABNORMAL LOW (ref 39.0–52.0)
Hemoglobin: 9.9 g/dL — ABNORMAL LOW (ref 13.0–17.0)
Hemoglobin: 8.8 g/dL — ABNORMAL LOW (ref 13.0–17.0)
Hemoglobin: 8.7 g/dL — ABNORMAL LOW (ref 13.0–17.0)
Immature Granulocytes: 0 %
Immature Granulocytes: 1 %
Immature Granulocytes: 1 %
Lymphocytes Relative: 59 %
Lymphocytes Relative: 55 %
Lymphocytes Relative: 40 %
Lymphs Abs: 0.5 10*3/uL — ABNORMAL LOW (ref 0.7–4.0)
Lymphs Abs: 0.5 10*3/uL — ABNORMAL LOW (ref 0.7–4.0)
Lymphs Abs: 0.5 10*3/uL — ABNORMAL LOW (ref 0.7–4.0)
MCH: 34.4 pg — ABNORMAL HIGH (ref 26.0–34.0)
MCH: 35.1 pg — ABNORMAL HIGH (ref 26.0–34.0)
MCH: 35.4 pg — ABNORMAL HIGH (ref 26.0–34.0)
MCHC: 34.4 g/dL (ref 30.0–36.0)
MCHC: 34.6 g/dL (ref 30.0–36.0)
MCHC: 34.4 g/dL (ref 30.0–36.0)
MCV: 100 fL (ref 80.0–100.0)
MCV: 101.2 fL — ABNORMAL HIGH (ref 80.0–100.0)
MCV: 102.8 fL — ABNORMAL HIGH (ref 80.0–100.0)
Monocytes Absolute: 0.1 10*3/uL (ref 0.1–1.0)
Monocytes Absolute: 0.1 10*3/uL (ref 0.1–1.0)
Monocytes Absolute: 0.1 10*3/uL (ref 0.1–1.0)
Monocytes Relative: 9 %
Monocytes Relative: 8 %
Monocytes Relative: 8 %
Neutro Abs: 0.2 10*3/uL — CL (ref 1.7–7.7)
Neutro Abs: 0.3 10*3/uL — CL (ref 1.7–7.7)
Neutro Abs: 0.5 10*3/uL — ABNORMAL LOW (ref 1.7–7.7)
Neutrophils Relative %: 26 %
Neutrophils Relative %: 32 %
Neutrophils Relative %: 46 %
Platelets: 23 10*3/uL — CL (ref 150–400)
Platelets: 22 10*3/uL — CL (ref 150–400)
Platelets: 43 10*3/uL — ABNORMAL LOW (ref 150–400)
RBC: 2.88 MIL/uL — ABNORMAL LOW (ref 4.22–5.81)
RBC: 2.51 MIL/uL — ABNORMAL LOW (ref 4.22–5.81)
RBC: 2.46 MIL/uL — ABNORMAL LOW (ref 4.22–5.81)
RDW: 12.3 % (ref 11.5–15.5)
RDW: 12.4 % (ref 11.5–15.5)
RDW: 12.5 % (ref 11.5–15.5)
WBC: 0.8 10*3/uL — CL (ref 4.0–10.5)
WBC: 1 10*3/uL — CL (ref 4.0–10.5)
WBC: 1.1 10*3/uL — CL (ref 4.0–10.5)
nRBC: 0 % (ref 0.0–0.2)
nRBC: 0 % (ref 0.0–0.2)
nRBC: 0 % (ref 0.0–0.2)

## 2022-08-22 LAB — COMPREHENSIVE METABOLIC PANEL
ALT: 29 U/L (ref 0–44)
AST: 22 U/L (ref 15–41)
Albumin: 3.5 g/dL (ref 3.5–5.0)
Alkaline Phosphatase: 128 U/L — ABNORMAL HIGH (ref 38–126)
Anion gap: 7 (ref 5–15)
BUN: 31 mg/dL — ABNORMAL HIGH (ref 8–23)
CO2: 19 mmol/L — ABNORMAL LOW (ref 22–32)
Calcium: 8.5 mg/dL — ABNORMAL LOW (ref 8.9–10.3)
Chloride: 104 mmol/L (ref 98–111)
Creatinine, Ser: 0.9 mg/dL (ref 0.61–1.24)
GFR, Estimated: >60 mL/min (ref >60)
Glucose, Bld: 149 mg/dL — ABNORMAL HIGH (ref 70–99)
Potassium: 4.1 mmol/L (ref 3.5–5.1)
Sodium: 130 mmol/L — ABNORMAL LOW (ref 135–145)
Total Bilirubin: 0.7 mg/dL (ref 0.3–1.2)
Total Protein: 7 g/dL (ref 6.5–8.1)

## 2022-08-22 LAB — URINALYSIS, ROUTINE W REFLEX MICROSCOPIC
Bacteria, UA: NONE SEEN
Bilirubin Urine: NEGATIVE
Glucose, UA: NEGATIVE mg/dL
Ketones, ur: NEGATIVE mg/dL
Leukocytes,Ua: NEGATIVE
Nitrite: NEGATIVE
Protein, ur: 100 mg/dL — AB
RBC / HPF: >50 RBC/hpf (ref 0–5)
Specific Gravity, Urine: 1.02 (ref 1.005–1.030)
pH: 5 (ref 5.0–8.0)

## 2022-08-22 LAB — BASIC METABOLIC PANEL
Anion gap: 7 (ref 5–15)
BUN: 26 mg/dL — ABNORMAL HIGH (ref 8–23)
CO2: 22 mmol/L (ref 22–32)
Calcium: 8.2 mg/dL — ABNORMAL LOW (ref 8.9–10.3)
Chloride: 104 mmol/L (ref 98–111)
Creatinine, Ser: 0.97 mg/dL (ref 0.61–1.24)
GFR, Estimated: >60 mL/min (ref >60)
Glucose, Bld: 112 mg/dL — ABNORMAL HIGH (ref 70–99)
Potassium: 3.9 mmol/L (ref 3.5–5.1)
Sodium: 133 mmol/L — ABNORMAL LOW (ref 135–145)

## 2022-08-22 LAB — RESP PANEL BY RT-PCR (RSV, FLU A&B, COVID)  RVPGX2
Influenza A by PCR: NEGATIVE
Influenza B by PCR: NEGATIVE
Resp Syncytial Virus by PCR: NEGATIVE
SARS Coronavirus 2 by RT PCR: NEGATIVE

## 2022-08-22 LAB — HEPATIC FUNCTION PANEL
ALT: 26 U/L (ref 0–44)
AST: 20 U/L (ref 15–41)
Albumin: 2.8 g/dL — ABNORMAL LOW (ref 3.5–5.0)
Alkaline Phosphatase: 101 U/L (ref 38–126)
Bilirubin, Direct: 0.2 mg/dL (ref 0.0–0.2)
Indirect Bilirubin: 0.5 mg/dL (ref 0.3–0.9)
Total Bilirubin: 0.7 mg/dL (ref 0.3–1.2)
Total Protein: 5.8 g/dL — ABNORMAL LOW (ref 6.5–8.1)

## 2022-08-22 LAB — LACTIC ACID, PLASMA
Lactic Acid, Venous: 0.9 mmol/L (ref 0.5–1.9)
Lactic Acid, Venous: 1.2 mmol/L (ref 0.5–1.9)

## 2022-08-22 LAB — PROTIME-INR
INR: 1.1 (ref 0.8–1.2)
Prothrombin Time: 13.8 seconds (ref 11.4–15.2)

## 2022-08-22 LAB — MAGNESIUM: Magnesium: 1.9 mg/dL (ref 1.7–2.4)

## 2022-08-22 LAB — ABO/RH: ABO/RH(D): O POS

## 2022-08-22 LAB — APTT: aPTT: 38 seconds — ABNORMAL HIGH (ref 24–36)

## 2022-08-22 MED ORDER — SODIUM CHLORIDE 0.9% IV SOLUTION: Freq: Once | INTRAVENOUS | Status: DC

## 2022-08-22 MED ORDER — SALINE SPRAY 0.65 % NA SOLN
1.0000 | NASAL | Status: DC | PRN
  Filled 2022-08-22: qty 44

## 2022-08-22 MED ORDER — ONDANSETRON HCL 4 MG/2ML IJ SOLN: 4.0000 mg | Freq: Four times a day (QID) | INTRAMUSCULAR | Status: DC | PRN

## 2022-08-22 MED ORDER — ACETAMINOPHEN 325 MG PO TABS
650.0000 mg | ORAL_TABLET | Freq: Four times a day (QID) | ORAL | Status: DC | PRN
  Administered 2022-08-22 – 2022-08-23 (×2): 650 mg via ORAL
  Filled 2022-08-22 (×2): qty 2

## 2022-08-22 MED ORDER — ACETAMINOPHEN 325 MG PO TABS
650.0000 mg | ORAL_TABLET | Freq: Once | ORAL | Status: AC
  Administered 2022-08-22: 650 mg via ORAL
  Filled 2022-08-22: qty 2

## 2022-08-22 MED ORDER — ONDANSETRON HCL 4 MG PO TABS: 4.0000 mg | ORAL_TABLET | Freq: Four times a day (QID) | ORAL | Status: DC | PRN

## 2022-08-22 MED ORDER — ROSUVASTATIN CALCIUM 20 MG PO TABS
20.0000 mg | ORAL_TABLET | Freq: Every day | ORAL | Status: DC
  Administered 2022-08-22 – 2022-08-24 (×3): 20 mg via ORAL
  Filled 2022-08-22 (×3): qty 1

## 2022-08-22 MED ORDER — ACETAMINOPHEN 650 MG RE SUPP: 650.0000 mg | Freq: Four times a day (QID) | RECTAL | Status: DC | PRN

## 2022-08-22 MED ORDER — HYDROCODONE-ACETAMINOPHEN 5-325 MG PO TABS
1.0000 | ORAL_TABLET | ORAL | Status: DC | PRN
  Administered 2022-08-24: 1 via ORAL
  Filled 2022-08-22: qty 1

## 2022-08-22 MED ORDER — SODIUM CHLORIDE 0.9 % IV SOLN
2.0000 g | Freq: Three times a day (TID) | INTRAVENOUS | Status: DC
  Administered 2022-08-22 – 2022-08-24 (×7): 2 g via INTRAVENOUS
  Filled 2022-08-22 (×7): qty 12.5

## 2022-08-22 MED ORDER — SENNOSIDES-DOCUSATE SODIUM 8.6-50 MG PO TABS: 1.0000 | ORAL_TABLET | Freq: Every evening | ORAL | Status: DC | PRN

## 2022-08-22 MED ORDER — SODIUM CHLORIDE 0.9 % IV SOLN: INTRAVENOUS | Status: AC

## 2022-08-22 MED ORDER — SODIUM CHLORIDE 0.9% FLUSH
3.0000 mL | Freq: Two times a day (BID) | INTRAVENOUS | Status: DC
  Administered 2022-08-22 – 2022-08-24 (×4): 3 mL via INTRAVENOUS

## 2022-08-22 MED ORDER — TAMSULOSIN HCL 0.4 MG PO CAPS
0.8000 mg | ORAL_CAPSULE | Freq: Every day | ORAL | Status: DC
  Administered 2022-08-22 – 2022-08-23 (×2): 0.8 mg via ORAL
  Filled 2022-08-22 (×2): qty 2

## 2022-08-22 NOTE — Progress Notes (Signed)
Pharmacy Antibiotic Note  Larry Kent is a 77 y.o. male admitted on 08/21/2022 with foley catheter that pt states is burning.  Recent admission of AKI, undergoing cancer treatment..  Pharmacy has been consulted to dose cefepime for FN.  Plan: Cefepime 2gm IV q8h Follow renal function,cultures and clinical course     Temp (24hrs), Avg:99.4 F (37.4 C), Min:99.4 F (37.4 C), Max:99.4 F (37.4 C)  Recent Labs  Lab 08/18/22 1335 08/18/22 1653 08/19/22 0500 08/21/22 1343 08/21/22 2340 08/21/22 2354 08/22/22 0204  WBC 0.7* 0.7* 0.5* 0.6* 0.8*  --   --   CREATININE 5.41* 4.89* 1.55*  --  0.90 0.90  --   LATICACIDVEN  --   --   --   --  1.2  --  0.9    Estimated Creatinine Clearance: 69.8 mL/min (by C-G formula based on SCr of 0.9 mg/dL).    No Known Allergies   Thank you for allowing pharmacy to be a part of this patient's care. Dolly Rias RPh 08/22/2022, 3:27 AM

## 2022-08-22 NOTE — H&P (Addendum)
History and Physical    THOMA MITRA [id number] DOB: [birth year] DOA: 08/21/2022  PCP: Ria Bush, MD   Patient coming from: Home   Chief Complaint: Fever   HPI: ADWIN CARRAHER is a 77 y.o. male with medical history significant for metastatic prostate cancer and lung nodules with pathology showing poorly differentiated neuroendocrine carcinoma, now presenting with fever.  Patient to experience prolonged epistaxis the night of 08/20/2022, noted to have platelet count of 21,000 the following day, and was planning to go to the cancer center this morning for platelet transfusion.  He went on to develop a fever to 101.4 F at home today which prompted his presentation to the ED. he denies any cough, shortness of breath, rhinorrhea, sore throat, neck stiffness, abdominal pain, dysuria, diarrhea, rash, or wounds.  Patient underwent his first cycle of chemotherapy on 08/11/2022 and had Neulasta injection on 08/15/2022.  He was admitted to the hospital on 08/18/2022 with acute urinary retention, bilateral hydronephrosis, and AKI.  Foley catheter was placed, tamsulosin was doubled, he was treated with IV fluids, and plans to follow-up with urology on 08/24/2022.    ED Course: Upon arrival to the ED, patient is found to be afebrile and saturating well on room air with slightly elevated heart rate and stable blood pressure.  Labs are most notable for negative respiratory virus panel, WBC 800 with ANC 208, hemoglobin 9.9, and platelets 23,000.  Blood and urine cultures were collected in the emergency department and the patient was treated with 1 L of LR and cefepime.  1 unit of platelets were ordered for transfusion.  Review of Systems:  All other systems reviewed and apart from HPI, are negative.  Past Medical History:  Diagnosis Date   Allergy    Arthritis    both knees    BCC (basal cell carcinoma), arm, right 09/2019   MOHS ([place])   BCC (basal cell carcinoma), face 2014   L  preauricular s/p MOHS   BCC (basal cell carcinoma), face 02/2018   L upper lip   BPH (benign prostatic hypertrophy)    Dr. Junious Silk @ [facility]   Carotid stenosis [id number]   RICA 123456, LICA 123456, L vertebral occlusion, rpt 1 yr   Cataract    removed both eyes    FHx: colon cancer    FHx: prostate cancer    Heart murmur    mild aortic stenosis    History of kidney stones    Hyperlipidemia    borderline- on rosuvastatin now normal    Occlusion of right vertebral artery 05/2015    Past Surgical History:  Procedure Laterality Date   BASAL CELL CARCINOMA EXCISION  05/2018   lip, 2020 x3  basal cells removed    CATARACT EXTRACTION  2008   Left   CATARACT EXTRACTION  2013   Right ([place])   COLONOSCOPY  ~2010   medium int hemorrhoids, o/w WNL, rpt 5 yrs given fmhx (Dr. Oletta Lamas)   COLONOSCOPY  08/2019   TAs, diverticulosis, rpt 3 yrs ([place])   ENDARTERECTOMY Right 12/01/2019   Procedure: RIGHT ENDARTERECTOMY CAROTID;  Surgeon: [name], MD;  Location: [facility];  Service: Vascular;  Laterality: Right;   exercise treadmill  11/2005   WNL [place])   FINE NEEDLE ASPIRATION  07/28/2022   Procedure: FINE NEEDLE ASPIRATION (FNA) LINEAR;  Surgeon: Garner Nash, DO;  Location: [facility];  Service: Pulmonary;;   HERNIA REPAIR  2008   Right   IR IMAGING GUIDED PORT  INSERTION  08/11/2022   MOHS SURGERY Left spring 2014   basal cell face   PATCH ANGIOPLASTY Right 12/01/2019   Procedure: PATCH ANGIOPLASTY USING Rueben Bash BIOLOGIC PATCH;  Surgeon: [name], MD;  Location: [facility];  Service: Vascular;  Laterality: Right;   Testicular Biopsy  2003   benign, varicocele   US ECHOCARDIOGRAPHY  11/2005   aortic sclerosis, EF 0000000, diastolic dysfunction   VIDEO BRONCHOSCOPY WITH ENDOBRONCHIAL ULTRASOUND Bilateral 07/28/2022   Procedure: VIDEO BRONCHOSCOPY WITH ENDOBRONCHIAL ULTRASOUND;  Surgeon: Garner Nash, DO;  Location: [facility];  Service: Pulmonary;   Laterality: Bilateral;    Social History:   reports that he has never smoked. He has never used smokeless tobacco. He reports that he does not currently use alcohol. He reports that he does not use drugs.  No Known Allergies  Family History  Problem Relation Age of Onset   Cancer Father 83       prostate   Coronary artery disease Father 52       MI   Prostate cancer Father    Cancer Paternal Grandfather 47       prostate   Prostate cancer Paternal Grandfather    Stroke Mother    Hypertension Mother    Cancer Mother 10       colon   Colon cancer Mother    Coronary artery disease Maternal Uncle    Cancer Sister 80       breast   Breast cancer Sister    Colon cancer Maternal Grandmother    Diabetes Neg Hx    Colon polyps Neg Hx    Esophageal cancer Neg Hx    Rectal cancer Neg Hx    Stomach cancer Neg Hx      Prior to Admission medications   Medication Sig Start Date End Date Taking? Authorizing Provider  acetaminophen (TYLENOL) 500 MG tablet Take 1,000 mg by mouth every 6 (six) hours as needed for mild pain or headache.   Yes [provider]  fluticasone (FLONASE) 50 MCG/ACT nasal spray Place 1 spray into both nostrils daily as needed for allergies or rhinitis.   Yes [provider]  lidocaine-prilocaine (EMLA) cream Apply to the Port-A-Cath site 30-60 minutes before chemotherapy treatment Patient taking differently: Apply 1 Application topically See admin instructions. Apply to the Port-A-Cath site 30-60 minutes before chemotherapy treatment 08/03/22  Yes Curt Bears, MD  Multiple Vitamin (MULTIVITAMIN) tablet Take 1 tablet by mouth daily with breakfast.   Yes [provider]  prochlorperazine (COMPAZINE) 10 MG tablet Take 1 tablet (10 mg total) by mouth every 6 (six) hours as needed for nausea or vomiting. 08/03/22  Yes Curt Bears, MD  rosuvastatin (CRESTOR) 20 MG tablet TAKE 1 TABLET BY MOUTH EVERY DAY Patient taking differently: Take 20  mg by mouth in the morning. 04/28/22  Yes Ria Bush, MD  solifenacin (VESICARE) 5 MG tablet Take 5 mg by mouth daily. 08/19/22  Yes [provider]  SYSTANE ULTRA PF 0.4-0.3 % SOLN Place 1 drop into both eyes 3 (three) times daily as needed (for dryness/irritation).   Yes [provider]  tamsulosin (FLOMAX) 0.4 MG CAPS capsule Take 2 capsules (0.8 mg total) by mouth in the morning and at bedtime for 7 days. 08/19/22 08/26/22 Yes [name], DO  Zinc 25 MG TABS Take 25 mg by mouth daily.   Yes [provider]  aspirin EC 81 MG tablet Take 1 tablet (81 mg total) by mouth daily.  Patient not taking: Reported on 08/22/2022 08/02/18   Wellington Hampshire, MD    Physical Exam: Vitals:   08/21/22 2325  BP: (!) 130/91  Pulse: (!) 101  Resp: 18  Temp: 99.4 F (37.4 C)  TempSrc: Oral  SpO2: 97%     Constitutional: NAD, no diaphoresis   Eyes: PERTLA, lids and conjunctivae normal ENMT: Mucous membranes are moist. Posterior pharynx clear of any exudate or lesions.   Neck: supple, no masses  Respiratory: no wheezing, no crackles. No accessory muscle use.  Cardiovascular: S1 & S2 heard, regular rate and rhythm. No extremity edema.  Abdomen: No distension, no tenderness, soft. Bowel sounds active.  Musculoskeletal: no clubbing / cyanosis. No joint deformity upper and lower extremities.   Skin: no significant rashes, lesions, ulcers. Warm, dry, well-perfused. Neurologic: CN 2-12 grossly intact. Moving all extremities. Alert and oriented.  Psychiatric: Calm. Cooperative.    Labs and Imaging on Admission: I have personally reviewed following labs and imaging studies  CBC: Recent Labs  Lab 08/18/22 1335 08/18/22 1653 08/19/22 0500 08/21/22 1343 08/21/22 2340 08/21/22 2354  WBC 0.7* 0.7* 0.5* 0.6* 0.8*  --   NEUTROABS 0.0* 0.0* 0.0* 0.1* 0.2*  --   HGB 10.8* 11.0* 10.4* 10.6* 9.9* 9.9*  HCT 30.4* 31.7* 30.7* 29.6* 28.8* 29.0*  MCV 98.1 101.6* 102.0* 98.3  100.0  --   PLT 46* 44* 35* 21* 23*  --    Basic Metabolic Panel: Recent Labs  Lab 08/18/22 1335 08/18/22 1653 08/19/22 0500 08/21/22 2340 08/21/22 2354  NA 134* 136 133* 130* 133*  K 5.3* 5.2* 5.0 4.1 4.3  CL 103 103 104 104 101  CO2 22 21* 23 19*  --   GLUCOSE 109* 88 98 149* 150*  BUN 81* 75* 38* 31* 27*  CREATININE 5.41* 4.89* 1.55* 0.90 0.90  CALCIUM 8.3* 8.3* 8.4* 8.5*  --   MG  --   --  1.9  --   --   PHOS  --  5.7* 3.1  --   --    GFR: Estimated Creatinine Clearance: 69.8 mL/min (by C-G formula based on SCr of 0.9 mg/dL). Liver Function Tests: Recent Labs  Lab 08/18/22 1335 08/18/22 1653 08/19/22 0500 08/21/22 2340  AST '25 29 27 22  '$ ALT 44 47* 40 29  ALKPHOS 104 88 80 128*  BILITOT 0.8 1.2 1.7* 0.7  PROT 5.8* 5.8* 5.7* 7.0  ALBUMIN 3.4* 3.2* 3.2* 3.5   No results for input(s): "LIPASE", "AMYLASE" in the last 168 hours. No results for input(s): "AMMONIA" in the last 168 hours. Coagulation Profile: Recent Labs  Lab 08/21/22 2340  INR 1.1   Cardiac Enzymes: No results for input(s): "CKTOTAL", "CKMB", "CKMBINDEX", "TROPONINI" in the last 168 hours. BNP (last 3 results) No results for input(s): "PROBNP" in the last 8760 hours. HbA1C: No results for input(s): "HGBA1C" in the last 72 hours. CBG: No results for input(s): "GLUCAP" in the last 168 hours. Lipid Profile: No results for input(s): "CHOL", "HDL", "LDLCALC", "TRIG", "CHOLHDL", "LDLDIRECT" in the last 72 hours. Thyroid Function Tests: No results for input(s): "TSH", "T4TOTAL", "FREET4", "T3FREE", "THYROIDAB" in the last 72 hours. Anemia Panel: No results for input(s): "VITAMINB12", "FOLATE", "FERRITIN", "TIBC", "IRON", "RETICCTPCT" in the last 72 hours. Urine analysis:    Component Value Date/Time   COLORURINE YELLOW 08/22/2022 0115   APPEARANCEUR HAZY (A) 08/22/2022 0115   LABSPEC 1.020 08/22/2022 0115   PHURINE 5.0 08/22/2022 0115   GLUCOSEU NEGATIVE 08/22/2022 0115   HGBUR  LARGE (A)  08/22/2022 0115   BILIRUBINUR NEGATIVE 08/22/2022 0115   KETONESUR NEGATIVE 08/22/2022 0115   PROTEINUR 100 (A) 08/22/2022 0115   NITRITE NEGATIVE 08/22/2022 0115   LEUKOCYTESUR NEGATIVE 08/22/2022 0115   Sepsis Labs: '@LABRCNTIP'$ (procalcitonin:4,lacticidven:4) ) Recent Results (from the past 240 hour(s))  Urine Culture (for pregnant, neutropenic or urologic patients or patients with an indwelling urinary catheter)     Status: None   Collection Time: 08/18/22  4:41 PM   Specimen: Urine, Catheterized  Result Value Ref Range Status   Specimen Description   Final    URINE, CATHETERIZED Performed at [facility], [place] [address] [place], [place] [address]    Special Requests   Final    Immunocompromised Performed at [facility], [place] [address] [place], [place] [address]    Culture   Final    NO GROWTH Performed at [facility], [place] [address], [place], [place] [address]    Report Status 08/19/2022 FINAL  Final  Culture, blood (routine x 2)     Status: None (Preliminary result)   Collection Time: 08/18/22  6:45 PM   Specimen: BLOOD  Result Value Ref Range Status   Specimen Description   Final    BLOOD RIGHT ANTECUBITAL Performed at [facility] [address], [place], [place] [address]    Special Requests   Final    BOTTLES DRAWN AEROBIC AND ANAEROBIC Blood Culture adequate volume Performed at [facility] [address] [place], [place] [address]    Culture   Final    NO GROWTH 3 DAYS Performed at [facility], [place] [address], [place], [place] [address]    Report Status PENDING  Incomplete  Culture, blood (routine x 2)     Status: None (Preliminary result)   Collection Time: 08/18/22  6:55 PM   Specimen: BLOOD  Result Value Ref Range Status   Specimen Description   Final    BLOOD LEFT ANTECUBITAL Performed at [facility] [address], [place], [place]  [address]    Special Requests   Final    BOTTLES DRAWN AEROBIC AND ANAEROBIC Blood Culture results may not be optimal due to an excessive volume of blood received in culture bottles Performed at [facility] [address] [place], [place] [address]    Culture   Final    NO GROWTH 3 DAYS Performed at [facility], [place] [address], [place], [place] [address]    Report Status PENDING  Incomplete  Resp panel by RT-PCR (RSV, Flu A&B, Covid) Anterior Nasal Swab     Status: None   Collection Time: 08/22/22  1:11 AM   Specimen: Anterior Nasal Swab  Result Value Ref Range Status   SARS Coronavirus 2 by RT PCR NEGATIVE NEGATIVE Final    Comment: (NOTE) SARS-CoV-2 target nucleic acids are NOT DETECTED.  The SARS-CoV-2 RNA is generally detectable in upper respiratory specimens during the acute phase of infection. The lowest concentration of SARS-CoV-2 viral copies this assay can detect is 138 copies/mL. A negative result does not preclude SARS-Cov-2 infection and should not be used as the sole basis for treatment or other patient management decisions. A negative result may occur with  improper specimen collection/handling, submission of specimen other than nasopharyngeal swab, presence of viral mutation(s) within the areas targeted by this assay, and inadequate number of viral copies(<138 copies/mL). A negative result must be combined with clinical  observations, patient history, and epidemiological information. The expected result is Negative.  Fact Sheet for Patients:  EntrepreneurPulse.com.au  Fact Sheet for Healthcare Providers:  IncredibleEmployment.be  This test is no t yet approved or cleared by the [place] FDA and  has been authorized for detection and/or diagnosis of SARS-CoV-2 by FDA under an Emergency Use Authorization (EUA). This EUA will remain  in effect (meaning this test can be used) for the duration of  the COVID-19 declaration under Section 564(b)(1) of the Act, 21 U.S.C.section 360bbb-3(b)(1), unless the authorization is terminated  or revoked sooner.       Influenza A by PCR NEGATIVE NEGATIVE Final   Influenza B by PCR NEGATIVE NEGATIVE Final    Comment: (NOTE) The Xpert Xpress SARS-CoV-2/FLU/RSV plus assay is intended as an aid in the diagnosis of influenza from Nasopharyngeal swab specimens and should not be used as a sole basis for treatment. Nasal washings and aspirates are unacceptable for Xpert Xpress SARS-CoV-2/FLU/RSV testing.  Fact Sheet for Patients: EntrepreneurPulse.com.au  Fact Sheet for Healthcare Providers: IncredibleEmployment.be  This test is not yet approved or cleared by the [place] FDA and has been authorized for detection and/or diagnosis of SARS-CoV-2 by FDA under an Emergency Use Authorization (EUA). This EUA will remain in effect (meaning this test can be used) for the duration of the COVID-19 declaration under Section 564(b)(1) of the Act, 21 U.S.C. section 360bbb-3(b)(1), unless the authorization is terminated or revoked.     Resp Syncytial Virus by PCR NEGATIVE NEGATIVE Final    Comment: (NOTE) Fact Sheet for Patients: EntrepreneurPulse.com.au  Fact Sheet for Healthcare Providers: IncredibleEmployment.be  This test is not yet approved or cleared by the [place] FDA and has been authorized for detection and/or diagnosis of SARS-CoV-2 by FDA under an Emergency Use Authorization (EUA). This EUA will remain in effect (meaning this test can be used) for the duration of the COVID-19 declaration under Section 564(b)(1) of the Act, 21 U.S.C. section 360bbb-3(b)(1), unless the authorization is terminated or revoked.  Performed at [facility], [place] [address] [place], [place] [address]      Radiological Exams on Admission: DG Chest 2  View  Result Date: 08/22/2022 CLINICAL DATA:  Questionable sepsis.  Multiple pulmonary metastases. EXAM: CHEST - 2 VIEW COMPARISON:  CT abdomen and pelvis no contrast 08/18/2022, CT chest with contrast 07/15/2022 FINDINGS: Heart size and vasculature are normal apart from calcification in the transverse aorta. There is a right chest port newly noted with IJ approach catheter terminating in the distal SVC. There are numerous nodular pulmonary metastases predominating in the lower zones. Largest on the left is in the upper lobe measuring 1.7 cm. Largest on the right is 2 cm in the middle lobe. Again noted is a small left pleural effusion. No focal pneumonia is seen. The right sulci are sharp. There are degenerative changes and mild kyphodextroscoliosis of the thoracic spine with osteopenia. No thoracic compression fractures. IMPRESSION: 1. Numerous nodular pulmonary metastases. 2. Small left pleural effusion. 3. No evidence of pneumonia or edema. 4. Aortic atherosclerosis. 5. Right chest port newly noted. Electronically Signed   By: Telford Nab M.D.   On: 08/22/2022 00:34    EKG: Independently reviewed. Sinus rhythm.   Assessment/Plan   1. Neutropenia with fever  - ANC is 208; pt reports temp of 101.4 F at home  - COVID, flu, and RSV pcr are negative; no acute CXR findings; urine does not appear infected; abdominal exam benign; no meningismus  or apparent cellulitis  - Blood and urine cultures were collected in ED and cefepime was started  - Continue cefepime, follow cultures and clinical course    2. Thrombocytopenia  - Platelets 23,000 on admission   - No active bleeding but had epistaxis and oncology recommended platelet transfusion  - Hold ASA, repeat CBC after platelet transfusion    3. Cancer  - Metastatic prostate cancer and poorly differentiated neuroendocrine carcinoma involving lungs, under the care of Dr. Junious Silk of urology and Dr. Julien Nordmann of oncology, had chemo on 2/13 and Neulasta  08/15/22    4. Hyponatremia  - Mild hyponatremia in setting of hypovolemia  - Continue isotonic IVF hydration and repeat chem panel in am    5. Urinary retention  - Continue Foley catheter for now, continue Flomax, follow-up with urology 2/26 as planned    DVT prophylaxis: SCDs  Code Status: Full  Level of Care: Level of care: Med-Surg Family Communication: Son at bedside   Disposition Plan:  Patient is from: home  Anticipated d/c is to: home  Anticipated d/c date is: Possibly as early as 08/23/22  Patient currently: pending cultures, stable blood counts  Consults called: none  Admission status: Observation     Vianne Bulls, MD [facility]  08/22/2022, 2:46 AM

## 2022-08-22 NOTE — Progress Notes (Signed)
  PROGRESS NOTE  Patient admitted earlier this morning. See H&P.   [name] is a 77 y.o. male with medical history significant for metastatic prostate cancer and lung nodules with pathology showing poorly differentiated neuroendocrine carcinoma, now presenting with fever.  Patient underwent his first cycle of chemotherapy on 08/11/2022 and had Neulasta injection on 08/15/2022.  He was admitted to the hospital on 08/18/2022 with acute urinary retention, bilateral hydronephrosis, and AKI.  Foley catheter was placed, tamsulosin was doubled, he was treated with IV fluids, and plans to follow-up with urology.   Patient to experience prolonged epistaxis the night of 08/20/2022, noted to have platelet count of 21,000 the following day, and was planning to go to the cancer center for platelet transfusion. He went on to develop a fever to 101.4 F at home today which prompted his presentation to the ED.   He has been afebrile in the hospital.  Neutropenic fever -COVID, flu, RSV, UA negative -Blood culture, urine culture pending -Empiric cefepime  Pancytopenia -In setting of chemotherapy 2/13.  Received Neulasta 2/17 -Hold aspirin -Transfused 1 unit platelets today due to epistaxis -Monitor  Poorly differentiated neuroendocrine carcinoma involving lungs Metastatic prostate cancer -Sees Dr. Julien Nordmann, Dr. Junious Silk  Acute urinary retention -Foley catheter was placed previous hospital stay.  Has follow-up with urology 2/26 -Flomax  Hyperlipidemia -Crestor   Status is: Observation The patient will require care spanning > 2 midnights and should be moved to inpatient because: IV antibiotics   [name], DO [facility] 08/22/2022, 12:17 PM  Available via Epic secure chat 7am-7pm After these hours, please refer to coverage provider listed on [email or web address]

## 2022-08-23 DIAGNOSIS — R5081 Fever presenting with conditions classified elsewhere: Secondary | ICD-10-CM | POA: Diagnosis not present

## 2022-08-23 DIAGNOSIS — D709 Neutropenia, unspecified: Secondary | ICD-10-CM | POA: Diagnosis not present

## 2022-08-23 LAB — CULTURE, BLOOD (ROUTINE X 2)
Culture: NO GROWTH
Culture: NO GROWTH
Special Requests: ADEQUATE

## 2022-08-23 LAB — CBC WITH DIFFERENTIAL/PLATELET
Abs Immature Granulocytes: 0.25 10*3/uL — ABNORMAL HIGH (ref 0.00–0.07)
Basophils Absolute: 0 10*3/uL (ref 0.0–0.1)
Basophils Relative: 2 %
Eosinophils Absolute: 0 10*3/uL (ref 0.0–0.5)
Eosinophils Relative: 2 %
HCT: 26 % — ABNORMAL LOW (ref 39.0–52.0)
Hemoglobin: 8.8 g/dL — ABNORMAL LOW (ref 13.0–17.0)
Immature Granulocytes: 10 %
Lymphocytes Relative: 29 %
Lymphs Abs: 0.7 10*3/uL (ref 0.7–4.0)
MCH: 35.1 pg — ABNORMAL HIGH (ref 26.0–34.0)
MCHC: 33.8 g/dL (ref 30.0–36.0)
MCV: 103.6 fL — ABNORMAL HIGH (ref 80.0–100.0)
Monocytes Absolute: 0.2 10*3/uL (ref 0.1–1.0)
Monocytes Relative: 9 %
Neutro Abs: 1.2 10*3/uL — ABNORMAL LOW (ref 1.7–7.7)
Neutrophils Relative %: 48 %
Platelets: 44 10*3/uL — ABNORMAL LOW (ref 150–400)
RBC: 2.51 MIL/uL — ABNORMAL LOW (ref 4.22–5.81)
RDW: 12.6 % (ref 11.5–15.5)
WBC: 2.5 10*3/uL — ABNORMAL LOW (ref 4.0–10.5)
nRBC: 0 % (ref 0.0–0.2)

## 2022-08-23 LAB — URINE CULTURE: Culture: NO GROWTH

## 2022-08-23 LAB — BASIC METABOLIC PANEL
Anion gap: 7 (ref 5–15)
BUN: 22 mg/dL (ref 8–23)
CO2: 22 mmol/L (ref 22–32)
Calcium: 8.4 mg/dL — ABNORMAL LOW (ref 8.9–10.3)
Chloride: 105 mmol/L (ref 98–111)
Creatinine, Ser: 1.07 mg/dL (ref 0.61–1.24)
GFR, Estimated: >60 mL/min (ref >60)
Glucose, Bld: 148 mg/dL — ABNORMAL HIGH (ref 70–99)
Potassium: 3.8 mmol/L (ref 3.5–5.1)
Sodium: 134 mmol/L — ABNORMAL LOW (ref 135–145)

## 2022-08-23 MED ORDER — TAMSULOSIN HCL 0.4 MG PO CAPS
0.8000 mg | ORAL_CAPSULE | Freq: Two times a day (BID) | ORAL | Status: DC
  Administered 2022-08-23 – 2022-08-24 (×2): 0.8 mg via ORAL
  Filled 2022-08-23 (×2): qty 2

## 2022-08-23 MED ORDER — DOCUSATE SODIUM 100 MG PO CAPS
100.0000 mg | ORAL_CAPSULE | Freq: Two times a day (BID) | ORAL | Status: DC | PRN
  Administered 2022-08-23 – 2022-08-24 (×2): 100 mg via ORAL
  Filled 2022-08-23 (×2): qty 1

## 2022-08-23 MED ORDER — CHLORHEXIDINE GLUCONATE CLOTH 2 % EX PADS
6.0000 | MEDICATED_PAD | Freq: Every day | CUTANEOUS | Status: DC
  Administered 2022-08-23: 6 via TOPICAL

## 2022-08-23 MED ORDER — HYDROXYZINE HCL 10 MG PO TABS
10.0000 mg | ORAL_TABLET | Freq: Once | ORAL | Status: AC | PRN
  Administered 2022-08-24: 10 mg via ORAL
  Filled 2022-08-23: qty 1

## 2022-08-23 NOTE — Plan of Care (Signed)
Patient AOX4, VSS throughout shift.  Pt c/o pain relieved by tylenol.  All meds given on time as ordered.  Foley care provided, output good.  Diminished lungs, IS encouraged.  POC maintained, will continue to monitor.  Problem: Education: Goal: Knowledge of General Education information will improve Description: Including pain rating scale, medication(s)/side effects and non-pharmacologic comfort measures Outcome: Progressing   Problem: Health Behavior/Discharge Planning: Goal: Ability to manage health-related needs will improve Outcome: Progressing   Problem: Clinical Measurements: Goal: Ability to maintain clinical measurements within normal limits will improve Outcome: Progressing Goal: Will remain free from infection Outcome: Progressing Goal: Diagnostic test results will improve Outcome: Progressing Goal: Respiratory complications will improve Outcome: Progressing Goal: Cardiovascular complication will be avoided Outcome: Progressing   Problem: Activity: Goal: Risk for activity intolerance will decrease Outcome: Progressing   Problem: Nutrition: Goal: Adequate nutrition will be maintained Outcome: Progressing   Problem: Coping: Goal: Level of anxiety will decrease Outcome: Progressing   Problem: Elimination: Goal: Will not experience complications related to bowel motility Outcome: Progressing Goal: Will not experience complications related to urinary retention Outcome: Progressing   Problem: Pain Managment: Goal: General experience of comfort will improve Outcome: Progressing   Problem: Safety: Goal: Ability to remain free from injury will improve Outcome: Progressing   Problem: Skin Integrity: Goal: Risk for impaired skin integrity will decrease Outcome: Progressing

## 2022-08-23 NOTE — Progress Notes (Signed)
PROGRESS NOTE    Larry Kent  [id number] DOB: 1945/12/10 DOA: 08/21/2022 PCP: Ria Bush, MD     Brief Narrative:  Larry Kent is a 77 y.o. male with medical history significant for metastatic prostate cancer and lung nodules with pathology showing poorly differentiated neuroendocrine carcinoma, now presenting with fever.   Patient underwent his first cycle of chemotherapy on 08/11/2022 and had Neulasta injection on 08/15/2022.  He was admitted to the hospital on 08/18/2022 with acute urinary retention, bilateral hydronephrosis, and AKI.  Foley catheter was placed, tamsulosin was doubled, he was treated with IV fluids, and plans to follow-up with urology.   Patient to experience prolonged epistaxis the night of 08/20/2022, noted to have platelet count of 21,000 the following day, and was planning to go to the cancer center for platelet transfusion. He went on to develop a fever to 101.4 F at home today which prompted his presentation to the ED.   New events last 24 hours / Subjective: No new issues, no bleeding, remained afebrile   Assessment & Plan:  Principal Problem:   Neutropenia with fever (HCC) Active Problems:   Malignant neoplasm of unspecified part of unspecified bronchus or lung (HCC)   Pancytopenia due to antineoplastic chemotherapy ([place])   Acute urinary retention   Prostate cancer metastatic to multiple sites ([place])   Thrombocytopenia ([place])   Neutropenic fever -COVID, flu, RSV, UA negative -Blood culture negative to date -Urine culture pending -Empiric cefepime   Pancytopenia -In setting of chemotherapy 2/13.  Received Neulasta 2/17 -Hold aspirin -Transfused 1 unit platelets 2/24 due to epistaxis -Monitor, improving    Poorly differentiated neuroendocrine carcinoma involving lungs Metastatic prostate cancer -Sees Dr. Julien Nordmann, Dr. Junious Silk   Acute urinary retention -Foley catheter was placed previous hospital stay.  Has follow-up with urology  2/26 -Flomax   Hyperlipidemia -Crestor   DVT prophylaxis:  SCDs Start: 08/22/22 0245  Code Status: Full Family Communication: None at bedside  Disposition Plan:  Status is: Inpatient Remains inpatient appropriate because: pancytopenia, IV antibiotics    Antimicrobials:  Anti-infectives (From admission, onward)    Start     Dose/Rate Route Frequency Ordered Stop   08/22/22 0800  ceFEPIme (MAXIPIME) 2 g in sodium chloride 0.9 % 100 mL IVPB        2 g 200 mL/hr over 30 Minutes Intravenous Every 8 hours 08/22/22 0302     08/21/22 2345  ceFEPIme (MAXIPIME) 2 g in sodium chloride 0.9 % 100 mL IVPB        2 g 200 mL/hr over 30 Minutes Intravenous  Once 08/21/22 2341 08/22/22 0045        Objective: Vitals:   08/22/22 1631 08/22/22 1952 08/22/22 2203 08/23/22 0431  BP: 117/60 (!) 148/70  102/69  Pulse: 71 74  66  Resp: '20 16  18  '$ Temp: 99 F (37.2 C) 99.1 F (37.3 C) 98.8 F (37.1 C) 98.4 F (36.9 C)  TempSrc: Oral Oral Oral Oral  SpO2: 97% 96%  95%    Intake/Output Summary (Last 24 hours) at 08/23/2022 1004 Last data filed at 08/23/2022 0900 Gross per 24 hour  Intake 1680 ml  Output 2250 ml  Net -570 ml   There were no vitals filed for this visit.  Examination:  General exam: Appears calm and comfortable  Respiratory system: Respiratory effort normal. No respiratory distress. No conversational dyspnea.  Central nervous system: Alert and oriented. Extremities: Symmetric in appearance  Psychiatry: Judgement and insight appear normal. Mood &  affect appropriate.   Data Reviewed: I have personally reviewed following labs and imaging studies  CBC: Recent Labs  Lab 08/21/22 1343 08/21/22 2340 08/21/22 2354 08/22/22 0500 08/22/22 1336 08/23/22 0500  WBC 0.6* 0.8*  --  1.0* 1.1* 2.5*  NEUTROABS 0.1* 0.2*  --  0.3* 0.5* 1.2*  HGB 10.6* 9.9* 9.9* 8.8* 8.7* 8.8*  HCT 29.6* 28.8* 29.0* 25.4* 25.3* 26.0*  MCV 98.3 100.0  --  101.2* 102.8* 103.6*  PLT 21* 23*  --   22* 43* 44*   Basic Metabolic Panel: Recent Labs  Lab 08/18/22 1653 08/19/22 0500 08/21/22 2340 08/21/22 2354 08/22/22 0500 08/23/22 0500  NA 136 133* 130* 133* 133* 134*  K 5.2* 5.0 4.1 4.3 3.9 3.8  CL 103 104 104 101 104 105  CO2 21* 23 19*  --  22 22  GLUCOSE 88 98 149* 150* 112* 148*  BUN 75* 38* 31* 27* 26* 22  CREATININE 4.89* 1.55* 0.90 0.90 0.97 1.07  CALCIUM 8.3* 8.4* 8.5*  --  8.2* 8.4*  MG  --  1.9  --   --  1.9  --   PHOS 5.7* 3.1  --   --   --   --    GFR: Estimated Creatinine Clearance: 58.7 mL/min (by C-G formula based on SCr of 1.07 mg/dL). Liver Function Tests: Recent Labs  Lab 08/18/22 1335 08/18/22 1653 08/19/22 0500 08/21/22 2340 08/22/22 0500  AST '25 29 27 22 20  '$ ALT 44 47* 40 29 26  ALKPHOS 104 88 80 128* 101  BILITOT 0.8 1.2 1.7* 0.7 0.7  PROT 5.8* 5.8* 5.7* 7.0 5.8*  ALBUMIN 3.4* 3.2* 3.2* 3.5 2.8*   No results for input(s): "LIPASE", "AMYLASE" in the last 168 hours. No results for input(s): "AMMONIA" in the last 168 hours. Coagulation Profile: Recent Labs  Lab 08/21/22 2340  INR 1.1   Cardiac Enzymes: No results for input(s): "CKTOTAL", "CKMB", "CKMBINDEX", "TROPONINI" in the last 168 hours. BNP (last 3 results) No results for input(s): "PROBNP" in the last 8760 hours. HbA1C: No results for input(s): "HGBA1C" in the last 72 hours. CBG: No results for input(s): "GLUCAP" in the last 168 hours. Lipid Profile: No results for input(s): "CHOL", "HDL", "LDLCALC", "TRIG", "CHOLHDL", "LDLDIRECT" in the last 72 hours. Thyroid Function Tests: No results for input(s): "TSH", "T4TOTAL", "FREET4", "T3FREE", "THYROIDAB" in the last 72 hours. Anemia Panel: No results for input(s): "VITAMINB12", "FOLATE", "FERRITIN", "TIBC", "IRON", "RETICCTPCT" in the last 72 hours. Sepsis Labs: Recent Labs  Lab 08/21/22 2340 08/22/22 0204  LATICACIDVEN 1.2 0.9    Recent Results (from the past 240 hour(s))  Urine Culture (for pregnant, neutropenic or  urologic patients or patients with an indwelling urinary catheter)     Status: None   Collection Time: 08/18/22  4:41 PM   Specimen: Urine, Catheterized  Result Value Ref Range Status   Specimen Description   Final    URINE, CATHETERIZED Performed at [place] [address] [place], [place] [address]    Special Requests   Final    Immunocompromised Performed at [facility], [place] [address], [place], [place] [address]    Culture   Final    NO GROWTH Performed at [facility], [place] [address], [place], [place] [address]    Report Status 08/19/2022 FINAL  Final  Culture, blood (routine x 2)     Status: None   Collection Time: 08/18/22  6:45 PM   Specimen: BLOOD  Result Value Ref Range Status   Specimen Description   Final    BLOOD RIGHT ANTECUBITAL Performed at [facility] [address], [place], [place] [address]    Special Requests   Final    BOTTLES DRAWN AEROBIC AND ANAEROBIC Blood Culture adequate volume Performed at [facility] [address] [place], [place] [address]    Culture   Final    NO GROWTH 5 DAYS Performed at [facility], [place] [address] [place], [place] [address]    Report Status 08/23/2022 FINAL  Final  Culture, blood (routine x 2)     Status: None   Collection Time: 08/18/22  6:55 PM   Specimen: BLOOD  Result Value Ref Range Status   Specimen Description   Final    BLOOD LEFT ANTECUBITAL Performed at [facility] [address], [place], [place] [address]    Special Requests   Final    BOTTLES DRAWN AEROBIC AND ANAEROBIC Blood Culture results may not be optimal due to an excessive volume of blood received in culture bottles Performed at [facility] [address], [place], [place] [address]    Culture   Final    NO GROWTH 5 DAYS Performed at [facility], [place] [address], [place], [place] [address]     Report Status 08/23/2022 FINAL  Final  Blood Culture (routine x 2)     Status: None (Preliminary result)   Collection Time: 08/21/22 11:40 PM   Specimen: BLOOD  Result Value Ref Range Status   Specimen Description   Final    BLOOD BLOOD LEFT FOREARM Performed at [facility] [address] [place], [place] [address]    Special Requests   Final    BOTTLES DRAWN AEROBIC AND ANAEROBIC Blood Culture adequate volume Performed at [facility] [address] [place], [place] [address]    Culture   Final    NO GROWTH 1 DAY Performed at [facility], [place] [address] [place], [place] [address]    Report Status PENDING  Incomplete  Blood Culture (routine x 2)     Status: None (Preliminary result)   Collection Time: 08/21/22 11:51 PM   Specimen: BLOOD  Result Value Ref Range Status   Specimen Description   Final    BLOOD BLOOD LEFT FOREARM Performed at [facility] [address] [place], [place] [address]    Special Requests   Final    BOTTLES DRAWN AEROBIC AND ANAEROBIC Blood Culture adequate volume Performed at [facility] [address], [place], [place] [address]    Culture   Final    NO GROWTH 1 DAY Performed at [facility], [place] [address] [place],  [address]    Report Status PENDING  Incomplete  Resp panel by RT-PCR (RSV, Flu A&B, Covid) Anterior Nasal Swab     Status: None   Collection Time: 08/22/22  1:11 AM   Specimen: Anterior Nasal Swab  Result Value Ref Range Status   SARS Coronavirus 2 by RT PCR NEGATIVE NEGATIVE Final    Comment: (NOTE) SARS-CoV-2 target nucleic acids are NOT DETECTED.  The SARS-CoV-2 RNA is generally detectable in upper respiratory specimens during the acute phase of infection. The lowest concentration of SARS-CoV-2 viral copies this assay can detect is 138 copies/mL. A negative result does not preclude SARS-Cov-2 infection and should not be used  as the  sole basis for treatment or other patient management decisions. A negative result may occur with  improper specimen collection/handling, submission of specimen other than nasopharyngeal swab, presence of viral mutation(s) within the areas targeted by this assay, and inadequate number of viral copies(<138 copies/mL). A negative result must be combined with clinical observations, patient history, and epidemiological information. The expected result is Negative.  Fact Sheet for Patients:  EntrepreneurPulse.com.au  Fact Sheet for Healthcare Providers:  IncredibleEmployment.be  This test is no t yet approved or cleared by the [place] FDA and  has been authorized for detection and/or diagnosis of SARS-CoV-2 by FDA under an Emergency Use Authorization (EUA). This EUA will remain  in effect (meaning this test can be used) for the duration of the COVID-19 declaration under Section 564(b)(1) of the Act, 21 U.S.C.section 360bbb-3(b)(1), unless the authorization is terminated  or revoked sooner.       Influenza A by PCR NEGATIVE NEGATIVE Final   Influenza B by PCR NEGATIVE NEGATIVE Final    Comment: (NOTE) The Xpert Xpress SARS-CoV-2/FLU/RSV plus assay is intended as an aid in the diagnosis of influenza from Nasopharyngeal swab specimens and should not be used as a sole basis for treatment. Nasal washings and aspirates are unacceptable for Xpert Xpress SARS-CoV-2/FLU/RSV testing.  Fact Sheet for Patients: EntrepreneurPulse.com.au  Fact Sheet for Healthcare Providers: IncredibleEmployment.be  This test is not yet approved or cleared by the [place] FDA and has been authorized for detection and/or diagnosis of SARS-CoV-2 by FDA under an Emergency Use Authorization (EUA). This EUA will remain in effect (meaning this test can be used) for the duration of the COVID-19 declaration under Section 564(b)(1)  of the Act, 21 U.S.C. section 360bbb-3(b)(1), unless the authorization is terminated or revoked.     Resp Syncytial Virus by PCR NEGATIVE NEGATIVE Final    Comment: (NOTE) Fact Sheet for Patients: EntrepreneurPulse.com.au  Fact Sheet for Healthcare Providers: IncredibleEmployment.be  This test is not yet approved or cleared by the [place] FDA and has been authorized for detection and/or diagnosis of SARS-CoV-2 by FDA under an Emergency Use Authorization (EUA). This EUA will remain in effect (meaning this test can be used) for the duration of the COVID-19 declaration under Section 564(b)(1) of the Act, 21 U.S.C. section 360bbb-3(b)(1), unless the authorization is terminated or revoked.  Performed at [facility], [place] [address] [place], [place] [address]       Radiology Studies: DG Chest 2 View  Result Date: 08/22/2022 CLINICAL DATA:  Questionable sepsis.  Multiple pulmonary metastases. EXAM: CHEST - 2 VIEW COMPARISON:  CT abdomen and pelvis no contrast 08/18/2022, CT chest with contrast 07/15/2022 FINDINGS: Heart size and vasculature are normal apart from calcification in the transverse aorta. There is a right chest port newly noted with IJ approach catheter terminating in the distal SVC. There are numerous nodular pulmonary metastases predominating in the lower zones. Largest on the left is in the upper lobe measuring 1.7 cm. Largest on the right is 2 cm in the middle lobe. Again noted is a small left pleural effusion. No focal pneumonia is seen. The right sulci are sharp. There are degenerative changes and mild kyphodextroscoliosis of the thoracic spine with osteopenia. No thoracic compression fractures. IMPRESSION: 1. Numerous nodular pulmonary metastases. 2. Small left pleural effusion. 3. No evidence of pneumonia or edema. 4. Aortic atherosclerosis. 5. Right chest port newly noted. Electronically Signed   By: Telford Nab M.D.   On: 08/22/2022 00:34  Scheduled Meds:  sodium chloride   Intravenous Once   Chlorhexidine Gluconate Cloth  6 each Topical Daily   rosuvastatin  20 mg Oral Daily   sodium chloride flush  3 mL Intravenous Q12H   tamsulosin  0.8 mg Oral BID   Continuous Infusions:  ceFEPime (MAXIPIME) IV 2 g (08/23/22 0823)     LOS: 1 day   Time spent: 25 minutes   [name], DO [facility] 08/23/2022, 10:04 AM   Available via Epic secure chat 7am-7pm After these hours, please refer to coverage provider listed on [email or web address]

## 2022-08-24 ENCOUNTER — Other Ambulatory Visit: Payer: Medicare Other

## 2022-08-24 LAB — CBC WITH DIFFERENTIAL/PLATELET
Abs Immature Granulocytes: 1.03 10*3/uL — ABNORMAL HIGH (ref 0.00–0.07)
Basophils Absolute: 0 10*3/uL (ref 0.0–0.1)
Basophils Relative: 0 %
Eosinophils Absolute: 0.1 10*3/uL (ref 0.0–0.5)
Eosinophils Relative: 1 %
HCT: 27.8 % — ABNORMAL LOW (ref 39.0–52.0)
Hemoglobin: 9.3 g/dL — ABNORMAL LOW (ref 13.0–17.0)
Immature Granulocytes: 11 %
Lymphocytes Relative: 11 %
Lymphs Abs: 1 10*3/uL (ref 0.7–4.0)
MCH: 34.6 pg — ABNORMAL HIGH (ref 26.0–34.0)
MCHC: 33.5 g/dL (ref 30.0–36.0)
MCV: 103.3 fL — ABNORMAL HIGH (ref 80.0–100.0)
Monocytes Absolute: 1.1 10*3/uL — ABNORMAL HIGH (ref 0.1–1.0)
Monocytes Relative: 12 %
Neutro Abs: 5.8 10*3/uL (ref 1.7–7.7)
Neutrophils Relative %: 65 %
Platelets: 64 10*3/uL — ABNORMAL LOW (ref 150–400)
RBC: 2.69 MIL/uL — ABNORMAL LOW (ref 4.22–5.81)
RDW: 12.5 % (ref 11.5–15.5)
WBC: 9 10*3/uL (ref 4.0–10.5)
nRBC: 0.3 % — ABNORMAL HIGH (ref 0.0–0.2)

## 2022-08-24 LAB — PREPARE PLATELET PHERESIS: Unit division: 0

## 2022-08-24 LAB — BASIC METABOLIC PANEL
Anion gap: 9 (ref 5–15)
BUN: 21 mg/dL (ref 8–23)
CO2: 22 mmol/L (ref 22–32)
Calcium: 8.6 mg/dL — ABNORMAL LOW (ref 8.9–10.3)
Chloride: 105 mmol/L (ref 98–111)
Creatinine, Ser: 1.13 mg/dL (ref 0.61–1.24)
GFR, Estimated: >60 mL/min (ref >60)
Glucose, Bld: 96 mg/dL (ref 70–99)
Potassium: 4.1 mmol/L (ref 3.5–5.1)
Sodium: 136 mmol/L (ref 135–145)

## 2022-08-24 LAB — BPAM PLATELET PHERESIS
Blood Product Expiration Date: 202402262359
ISSUE DATE / TIME: 202402240553
Unit Type and Rh: 6200

## 2022-08-24 MED ORDER — HEPARIN SOD (PORK) LOCK FLUSH 100 UNIT/ML IV SOLN
500.0000 [IU] | Freq: Once | INTRAVENOUS | Status: AC
  Administered 2022-08-24: 500 [IU] via INTRAVENOUS
  Filled 2022-08-24: qty 5

## 2022-08-24 MED ORDER — AMOXICILLIN-POT CLAVULANATE 875-125 MG PO TABS: 1.0000 | ORAL_TABLET | Freq: Two times a day (BID) | ORAL | 0 refills | Status: AC

## 2022-08-24 MED ORDER — CIPROFLOXACIN HCL 500 MG PO TABS: 500.0000 mg | ORAL_TABLET | Freq: Two times a day (BID) | ORAL | 0 refills | Status: AC

## 2022-08-24 NOTE — Discharge Summary (Signed)
Physician Discharge Summary  HY SCHUFF [id number] DOB: 1946-02-11 DOA: 08/21/2022  PCP: Ria Bush, MD  Admit date: 08/21/2022 Discharge date: 08/24/2022  Admitted From: Home Disposition:  Home   Recommendations for Outpatient Follow-up:  Follow up with Dr. Junious Silk 2/27.  Discussed with him today.  His office will arrange follow-up for voiding trial in the office. Follow-up with Dr. Earlie Server this week.   Discharge Condition: Stable CODE STATUS: Full code Diet recommendation: Regular diet  Brief/Interim Summary: Larry Kent is a 77 y.o. male with medical history significant for metastatic prostate cancer and lung nodules with pathology showing poorly differentiated neuroendocrine carcinoma, now presenting with fever.   Patient underwent his first cycle of chemotherapy on 08/11/2022 and had Neulasta injection on 08/15/2022.  He was admitted to the hospital on 08/18/2022 with acute urinary retention, bilateral hydronephrosis, and AKI.  Foley catheter was placed, tamsulosin was doubled, he was treated with IV fluids, and plans to follow-up with urology.   Patient to experience prolonged epistaxis the night of 08/20/2022, noted to have platelet count of 21,000 the following day, and was planning to go to the cancer center for platelet transfusion. He went on to develop a fever to 101.4 F at home today which prompted his presentation to the ED.   He was started on cefepime for neutropenic fever.  Cultures remained negative without source of infection found.  He remained afebrile in the hospital.  He received 1 unit of platelets.  He has had no further episodes of bleeding.  Case discussed with both Dr. Julien Nordmann and Dr. Junious Silk on day of discharge.  They plan to follow-up with patient in the office.  Discharge Diagnoses:   Principal Problem:   Neutropenia with fever ([place]) Active Problems:   Malignant neoplasm of unspecified part of unspecified bronchus or lung (H[facility])    Pancytopenia due to antineoplastic chemotherapy ([place])   Acute urinary retention   Prostate cancer metastatic to multiple sites ([place])   Thrombocytopenia ([place])   Neutropenic fever -COVID, flu, RSV, UA negative -Blood culture negative to date -Urine culture negative -Empiric cefepime --> Cipro/Augmentin for discharge. Has been afebrile the entire hospitalization.    Pancytopenia -In setting of chemotherapy 2/13.  Received Neulasta 2/17 -Hold aspirin -Transfused 1 unit platelets 2/24 due to epistaxis -Monitor, improving    Poorly differentiated neuroendocrine carcinoma involving lungs Metastatic prostate cancer -Sees Dr. Julien Nordmann, Dr. Junious Silk   Acute urinary retention -Foley catheter was placed previous hospital stay.  Has follow-up with urology 2/27 -Flomax   Hyperlipidemia -Crestor  Discharge Instructions  Discharge Instructions     Call MD for:  difficulty breathing, headache or visual disturbances   Complete by: As directed    Call MD for:  extreme fatigue   Complete by: As directed    Call MD for:  hives   Complete by: As directed    Call MD for:  persistant dizziness or light-headedness   Complete by: As directed    Call MD for:  persistant nausea and vomiting   Complete by: As directed    Call MD for:  severe uncontrolled pain   Complete by: As directed    Call MD for:  temperature >100.4   Complete by: As directed    Diet general   Complete by: As directed    Discharge instructions   Complete by: As directed    You were cared for by a hospitalist during your hospital stay. If you have any questions about your discharge medications  or the care you received while you were in the hospital after you are discharged, you can call the unit and ask to speak with the hospitalist on call if the hospitalist that took care of you is not available. Once you are discharged, your primary care physician will handle any further medical issues. Please note that NO REFILLS for any  discharge medications will be authorized once you are discharged, as it is imperative that you return to your primary care physician (or establish a relationship with a primary care physician if you do not have one) for your aftercare needs so that they can reassess your need for medications and monitor your lab values.   Increase activity slowly   Complete by: As directed       Allergies as of 08/24/2022   No Known Allergies      Medication List     STOP taking these medications    aspirin EC 81 MG tablet       TAKE these medications    acetaminophen 500 MG tablet Commonly known as: TYLENOL Take 1,000 mg by mouth every 6 (six) hours as needed for mild pain or headache.   amoxicillin-clavulanate 875-125 MG tablet Commonly known as: AUGMENTIN Take 1 tablet by mouth 2 (two) times daily for 5 days.   ciprofloxacin 500 MG tablet Commonly known as: Cipro Take 1 tablet (500 mg total) by mouth 2 (two) times daily for 5 days.   fluticasone 50 MCG/ACT nasal spray Commonly known as: FLONASE Place 1 spray into both nostrils daily as needed for allergies or rhinitis.   lidocaine-prilocaine cream Commonly known as: EMLA Apply to the Port-A-Cath site 30-60 minutes before chemotherapy treatment What changed:  how much to take how to take this when to take this   multivitamin tablet Take 1 tablet by mouth daily with breakfast.   prochlorperazine 10 MG tablet Commonly known as: COMPAZINE Take 1 tablet (10 mg total) by mouth every 6 (six) hours as needed for nausea or vomiting.   rosuvastatin 20 MG tablet Commonly known as: CRESTOR TAKE 1 TABLET BY MOUTH EVERY DAY What changed: when to take this   solifenacin 5 MG tablet Commonly known as: VESICARE Take 5 mg by mouth daily.   Systane Ultra PF 0.4-0.3 % Soln Generic drug: Polyethyl Glyc-Propyl Glyc PF Place 1 drop into both eyes 3 (three) times daily as needed (for dryness/irritation).   tamsulosin 0.4 MG Caps  capsule Commonly known as: FLOMAX Take 2 capsules (0.8 mg total) by mouth in the morning and at bedtime for 7 days.   Zinc 25 MG Tabs Take 25 mg by mouth daily.        Follow-up Information     Festus Aloe, MD Follow up.   Specialty: Urology Contact information: [place] [address] [phone]         Curt Bears, MD Follow up.   Specialty: Oncology Contact information: [place] [address] [phone]                No Known Allergies    Procedures/Studies: DG Chest 2 View  Result Date: 08/22/2022 CLINICAL DATA:  Questionable sepsis.  Multiple pulmonary metastases. EXAM: CHEST - 2 VIEW COMPARISON:  CT abdomen and pelvis no contrast 08/18/2022, CT chest with contrast 07/15/2022 FINDINGS: Heart size and vasculature are normal apart from calcification in the transverse aorta. There is a right chest port newly noted with IJ approach catheter terminating in the distal  SVC. There are numerous nodular pulmonary metastases predominating in the lower zones. Largest on the left is in the upper lobe measuring 1.7 cm. Largest on the right is 2 cm in the middle lobe. Again noted is a small left pleural effusion. No focal pneumonia is seen. The right sulci are sharp. There are degenerative changes and mild kyphodextroscoliosis of the thoracic spine with osteopenia. No thoracic compression fractures. IMPRESSION: 1. Numerous nodular pulmonary metastases. 2. Small left pleural effusion. 3. No evidence of pneumonia or edema. 4. Aortic atherosclerosis. 5. Right chest port newly noted. Electronically Signed   By: Telford Nab M.D.   On: 08/22/2022 00:34   CT ABDOMEN PELVIS WO CONTRAST  Result Date: 08/18/2022 CLINICAL DATA:  Abdominal pain, acute, nonlocalized EXAM: CT ABDOMEN AND PELVIS WITHOUT CONTRAST TECHNIQUE: Multidetector CT imaging of the abdomen and pelvis was performed following the standard protocol without IV contrast.  RADIATION DOSE REDUCTION: This exam was performed according to the departmental dose-optimization program which includes automated exposure control, adjustment of the mA and/or kV according to patient size and/or use of iterative reconstruction technique. COMPARISON:  PET-CT 07/15/2022 FINDINGS: Lower chest: Stable small left pleural effusion. Coronary calcifications. Innumerable pulmonary nodules. Largest 2.8 cm in the right middle lobe, previously 2.4 cm. Largest left nodule in the anterior basal segment left lower lobe image 20/3 2.1 cm, previously 1.9. Hepatobiliary: No focal liver abnormality is seen. No gallstones, gallbladder wall thickening, or biliary dilatation. Pancreas: Diffuse parenchymal atrophy without mass or ductal dilatation. No regional inflammatory change. Spleen: Normal in size without focal abnormality. Adrenals/Urinary Tract: No adrenal mass. Left nephrolithiasis, largest stone 3 mm midpole. There is mild bilateral hydronephrosis right greater than left and ureterectasis down to thick-walled urinary bladder, partially decompressed by Foley catheter. Stomach/Bowel: Stomach is nondistended. Small bowel decompressed. Normal appendix. Colon is partially distended by gas and fecal material, with scattered distal descending and sigmoid diverticula; no adjacent inflammatory change. Vascular/Lymphatic: Moderate scattered calcified aortoiliac calcified plaque without aneurysm. Retroaortic left renal vein, anatomic variant. No abdominal or pelvic adenopathy. Reproductive: Prostate enlargement with coarse calcifications Other: No ascites.  No free air. Musculoskeletal: Mild lumbar dextroscoliosis apex L3 with multilevel spondylitic change. Sclerotic lesions in bilateral inferior ischiopubic rami, left sacrum. For no fracture or acute process. IMPRESSION: 1. Mild bilateral hydronephrosis and ureterectasis down to thick-walled urinary bladder, partially decompressed by Foley catheter. 2. Right  nephrolithiasis. 3. Innumerable pulmonary nodules, some of which have increased in size. 4. Stable sacral and pelvic bone lesions. 5. Stable small left pleural effusion. 6. Descending and sigmoid diverticulosis. 7.  Aortic Atherosclerosis (ICD10-I70.0). Electronically Signed   By: Lucrezia Europe M.D.   On: 08/18/2022 17:52   IR IMAGING GUIDED PORT INSERTION  Result Date: 08/11/2022 INDICATION: 77 year old male referred for port catheter EXAM: IMAGE GUIDED PORT CATHETER MEDICATIONS: None ANESTHESIA/SEDATION: Moderate (conscious) sedation was employed during this procedure. A total of Versed 2.0 mg and Fentanyl 100 mcg was administered intravenously. Moderate Sedation Time: 18 minutes. The patient's level of consciousness and vital signs were monitored continuously by radiology nursing throughout the procedure under my direct supervision. FLUOROSCOPY TIME:  Fluoroscopy Time:   (1 mGy). COMPLICATIONS: None PROCEDURE: Informed written consent was obtained from the patient after a thorough discussion of the procedural risks, benefits and alternatives. All questions were addressed. Maximal Sterile Barrier Technique was utilized including caps, mask, sterile gowns, sterile gloves, sterile drape, hand hygiene and skin antiseptic. A timeout was performed prior to the initiation of the procedure. Ultrasound survey was  performed with images stored and sent to PACs. Right IJ vein documented to be patent. The right neck and chest was prepped with chlorhexidine, and draped in the usual sterile fashion using maximum barrier technique (cap and mask, sterile gown, sterile gloves, large sterile sheet, hand hygiene and cutaneous antiseptic). Local anesthesia was attained by infiltration with 1% lidocaine without epinephrine. Ultrasound demonstrated patency of the right internal jugular vein, and this was documented with an image. Under real-time ultrasound guidance, this vein was accessed with a 21 gauge micropuncture needle and image  documentation was performed. A small dermatotomy was made at the access site with an 11 scalpel. A 0.018" wire was advanced into the SVC and used to estimate the length of the internal catheter. The access needle exchanged for a 98F micropuncture vascular sheath. The 0.018" wire was then removed and a 0.035" wire advanced into the IVC. An appropriate location for the subcutaneous reservoir was selected below the clavicle and an incision was made through the skin and underlying soft tissues. The subcutaneous tissues were then dissected using a combination of blunt and sharp surgical technique and a pocket was formed. A single lumen power injectable portacatheter was then tunneled through the subcutaneous tissues from the pocket to the dermatotomy and the port reservoir placed within the subcutaneous pocket. The venous access site was then serially dilated and a peel away vascular sheath placed over the wire. The wire was removed and the port catheter advanced into position under fluoroscopic guidance. The catheter tip is positioned in the cavoatrial junction. This was documented with a spot image. The portacatheter was then tested and found to flush and aspirate well. The pocket was then closed in two layers using first subdermal inverted interrupted absorbable sutures followed by a running subcuticular suture. The epidermis was then sealed with Dermabond. Steri-Strips were placed. The dermatotomy at the venous access site was also seal with Dermabond. After completion, the [facility] needle was used to access the port and additional flush was performed to confirm positioning and function. The patient has immediate appointment for use. Patient tolerated the procedure well and remained hemodynamically stable throughout. No complications encountered and no significant blood loss encountered IMPRESSION: Status post right IJ port catheter placement. Signed, Dulcy Fanny. Nadene Rubins, RPVI V[facility] Inter[facility] Radiology  [facility] Radiology Electronically Signed   By: Corrie Mckusick D.O.   On: 08/11/2022 10:23   MR BRAIN W WO CONTRAST  Result Date: 08/10/2022 CLINICAL DATA:  Non-small-cell lung cancer, staging. EXAM: MRI HEAD WITHOUT AND WITH CONTRAST TECHNIQUE: Multiplanar, multiecho pulse sequences of the brain and surrounding structures were obtained without and with intravenous contrast. CONTRAST:  21m GADAVIST GADOBUTROL 1 MMOL/ML IV SOLN COMPARISON:  MRI brain 02/08/2020. FINDINGS: Brain: Unchanged encephalomalacia in the right middle frontal gyrus and right parietal lobe, favored to reflect old infarcts. No acute infarct or intracranial hemorrhage. Single microhemorrhage in the left basal ganglia. No mass or abnormal enhancement. No hydrocephalus or extra-axial collection. Vascular: Normal flow voids. Skull and upper cervical spine: Normal marrow signal and enhancement. Sinuses/Orbits: Unremarkable. Other: None. IMPRESSION: No evidence of metastatic disease in the brain. Electronically Signed   By: WEmmit AlexandersM.D.   On: 08/10/2022 10:46       Discharge Exam: Vitals:   08/24/22 0032 08/24/22 0457  BP:  103/64  Pulse: 78 73  Resp:  18  Temp:  98 F (36.7 C)  SpO2: 99% 96%    General: Pt is alert, awake, not in acute  distress Cardiovascular: RRR, S1/S2 +, no edema Respiratory: CTA bilaterally, no wheezing, no rhonchi, no respiratory distress, no conversational dyspnea  Abdominal: Soft, NT, ND, bowel sounds + Extremities: no edema, no cyanosis Psych: Normal mood and affect, stable judgement and insight     The results of significant diagnostics from this hospitalization (including imaging, microbiology, ancillary and laboratory) are listed below for reference.     Microbiology: Recent Results (from the past 240 hour(s))  Urine Culture (for pregnant, neutropenic or urologic patients or patients with an indwelling urinary catheter)     Status: None   Collection Time: 08/18/22  4:41  PM   Specimen: Urine, Catheterized  Result Value Ref Range Status   Specimen Description   Final    URINE, CATHETERIZED Performed at [facility] [address], [place], [place] [address]    Special Requests   Final    Immunocompromised Performed at [facility], [place] [address], [place], [place] [address]    Culture   Final    NO GROWTH Performed at [facility], [place] [address], [place], [place] [address]    Report Status 08/19/2022 FINAL  Final  Culture, blood (routine x 2)     Status: None   Collection Time: 08/18/22  6:45 PM   Specimen: BLOOD  Result Value Ref Range Status   Specimen Description   Final    BLOOD RIGHT ANTECUBITAL Performed at [facility] [address] [place], [place] [address]    Special Requests   Final    BOTTLES DRAWN AEROBIC AND ANAEROBIC Blood Culture adequate volume Performed at [facility] [address], [place], [place] [address]    Culture   Final    NO GROWTH 5 DAYS Performed at [facility], [place] [address] [place], [place] [address]    Report Status 08/23/2022 FINAL  Final  Culture, blood (routine x 2)     Status: None   Collection Time: 08/18/22  6:55 PM   Specimen: BLOOD  Result Value Ref Range Status   Specimen Description   Final    BLOOD LEFT ANTECUBITAL Performed at [facility] [address] [place], [place] [address]    Special Requests   Final    BOTTLES DRAWN AEROBIC AND ANAEROBIC Blood Culture results may not be optimal due to an excessive volume of blood received in culture bottles Performed at [facility] [address], [place], [place] [address]    Culture   Final    NO GROWTH 5 DAYS Performed at [facility], [place] [address], [place], [place] [address]    Report Status 08/23/2022 FINAL  Final  Blood Culture (routine x 2)     Status: None (Preliminary result)   Collection  Time: 08/21/22 11:40 PM   Specimen: BLOOD  Result Value Ref Range Status   Specimen Description   Final    BLOOD BLOOD LEFT FOREARM Performed at [facility] [address], [place], [place] [address]    Special Requests   Final    BOTTLES DRAWN AEROBIC AND ANAEROBIC Blood Culture adequate volume Performed at [facility] [address] [place], [place] [address]    Culture   Final    NO GROWTH 2 DAYS Performed at [facility] [address], [place],  [address]    Report Status PENDING  Incomplete  Blood Culture (routine x 2)  Status: None (Preliminary result)   Collection Time: 08/21/22 11:51 PM   Specimen: BLOOD  Result Value Ref Range Status   Specimen Description   Final    BLOOD BLOOD LEFT FOREARM Performed at [facility] [address], [place], [place] [address]    Special Requests   Final    BOTTLES DRAWN AEROBIC AND ANAEROBIC Blood Culture adequate volume Performed at [facility] [address] [place], [place] [address]    Culture   Final    NO GROWTH 2 DAYS Performed at [facility] [address] [place], [place] [address]    Report Status PENDING  Incomplete  Resp panel by RT-PCR (RSV, Flu A&B, Covid) Anterior Nasal Swab     Status: None   Collection Time: 08/22/22  1:11 AM   Specimen: Anterior Nasal Swab  Result Value Ref Range Status   SARS Coronavirus 2 by RT PCR NEGATIVE NEGATIVE Final    Comment: (NOTE) SARS-CoV-2 target nucleic acids are NOT DETECTED.  The SARS-CoV-2 RNA is generally detectable in upper respiratory specimens during the acute phase of infection. The lowest concentration of SARS-CoV-2 viral copies this assay can detect is 138 copies/mL. A negative result does not preclude SARS-Cov-2 infection and should not be used as the sole basis for treatment or other patient management decisions. A negative result may occur with  improper  specimen collection/handling, submission of specimen other than nasopharyngeal swab, presence of viral mutation(s) within the areas targeted by this assay, and inadequate number of viral copies(<138 copies/mL). A negative result must be combined with clinical observations, patient history, and epidemiological information. The expected result is Negative.  Fact Sheet for Patients:  EntrepreneurPulse.com.au  Fact Sheet for Healthcare Providers:  IncredibleEmployment.be  This test is no t yet approved or cleared by the [place] FDA and  has been authorized for detection and/or diagnosis of SARS-CoV-2 by FDA under an Emergency Use Authorization (EUA). This EUA will remain  in effect (meaning this test can be used) for the duration of the COVID-19 declaration under Section 564(b)(1) of the Act, 21 U.S.C.section 360bbb-3(b)(1), unless the authorization is terminated  or revoked sooner.       Influenza A by PCR NEGATIVE NEGATIVE Final   Influenza B by PCR NEGATIVE NEGATIVE Final    Comment: (NOTE) The Xpert Xpress SARS-CoV-2/FLU/RSV plus assay is intended as an aid in the diagnosis of influenza from Nasopharyngeal swab specimens and should not be used as a sole basis for treatment. Nasal washings and aspirates are unacceptable for Xpert Xpress SARS-CoV-2/FLU/RSV testing.  Fact Sheet for Patients: EntrepreneurPulse.com.au  Fact Sheet for Healthcare Providers: IncredibleEmployment.be  This test is not yet approved or cleared by the [place] FDA and has been authorized for detection and/or diagnosis of SARS-CoV-2 by FDA under an Emergency Use Authorization (EUA). This EUA will remain in effect (meaning this test can be used) for the duration of the COVID-19 declaration under Section 564(b)(1) of the Act, 21 U.S.C. section 360bbb-3(b)(1), unless the authorization is terminated or revoked.     Resp  Syncytial Virus by PCR NEGATIVE NEGATIVE Final    Comment: (NOTE) Fact Sheet for Patients: EntrepreneurPulse.com.au  Fact Sheet for Healthcare Providers: IncredibleEmployment.be  This test is not yet approved or cleared by the [place] FDA and has been authorized for detection and/or diagnosis of SARS-CoV-2 by FDA under an Emergency Use Authorization (EUA). This EUA will remain in effect (meaning this test can be used) for the  duration of the COVID-19 declaration under Section 564(b)(1) of the Act, 21 U.S.C. section 360bbb-3(b)(1), unless the authorization is terminated or revoked.  Performed at [facility], [place] [address] [place], [place] [address]   Urine Culture (for pregnant, neutropenic or urologic patients or patients with an indwelling urinary catheter)     Status: None   Collection Time: 08/22/22  1:16 AM   Specimen: Urine, Clean Catch  Result Value Ref Range Status   Specimen Description   Final    URINE, CLEAN CATCH Performed at [facility], [place] [address], [place], [place] [address]    Special Requests   Final    NONE Performed at [facility], [place] [address] [place], [place] [address]    Culture   Final    NO GROWTH Performed at [facility], [place] [address], [place],  [address]    Report Status 08/23/2022 FINAL  Final     Labs: BNP (last 3 results) No results for input(s): "BNP" in the last 8760 hours. Basic Metabolic Panel: Recent Labs  Lab 08/18/22 1653 08/19/22 0500 08/21/22 2340 08/21/22 2354 08/22/22 0500 08/23/22 0500 08/24/22 0601  NA 136 133* 130* 133* 133* 134* 136  K 5.2* 5.0 4.1 4.3 3.9 3.8 4.1  CL 103 104 104 101 104 105 105  CO2 21* 23 19*  --  '22 22 22  '$ GLUCOSE 88 98 149* 150* 112* 148* 96  BUN 75* 38* 31* 27* 26* 22 21  CREATININE 4.89* 1.55* 0.90 0.90 0.97 1.07 1.13  CALCIUM 8.3* 8.4* 8.5*  --  8.2* 8.4* 8.6*  MG  --   1.9  --   --  1.9  --   --   PHOS 5.7* 3.1  --   --   --   --   --    Liver Function Tests: Recent Labs  Lab 08/18/22 1335 08/18/22 1653 08/19/22 0500 08/21/22 2340 08/22/22 0500  AST '25 29 27 22 20  '$ ALT 44 47* 40 29 26  ALKPHOS 104 88 80 128* 101  BILITOT 0.8 1.2 1.7* 0.7 0.7  PROT 5.8* 5.8* 5.7* 7.0 5.8*  ALBUMIN 3.4* 3.2* 3.2* 3.5 2.8*   No results for input(s): "LIPASE", "AMYLASE" in the last 168 hours. No results for input(s): "AMMONIA" in the last 168 hours. CBC: Recent Labs  Lab 08/21/22 2340 08/21/22 2354 08/22/22 0500 08/22/22 1336 08/23/22 0500 08/24/22 0601  WBC 0.8*  --  1.0* 1.1* 2.5* 9.0  NEUTROABS 0.2*  --  0.3* 0.5* 1.2* 5.8  HGB 9.9* 9.9* 8.8* 8.7* 8.8* 9.3*  HCT 28.8* 29.0* 25.4* 25.3* 26.0* 27.8*  MCV 100.0  --  101.2* 102.8* 103.6* 103.3*  PLT 23*  --  22* 43* 44* 64*   Cardiac Enzymes: No results for input(s): "CKTOTAL", "CKMB", "CKMBINDEX", "TROPONINI" in the last 168 hours. BNP: Invalid input(s): "POCBNP" CBG: No results for input(s): "GLUCAP" in the last 168 hours. D-Dimer No results for input(s): "DDIMER" in the last 72 hours. Hgb A1c No results for input(s): "HGBA1C" in the last 72 hours. Lipid Profile No results for input(s): "CHOL", "HDL", "LDLCALC", "TRIG", "CHOLHDL", "LDLDIRECT" in the last 72 hours. Thyroid function studies No results for input(s): "TSH", "T4TOTAL", "T3FREE", "THYROIDAB" in the last 72 hours.  Invalid input(s): "FREET3" Anemia work up No results for input(s): "VITAMINB12", "FOLATE", "FERRITIN", "TIBC", "IRON", "RETICCTPCT" in the last 72 hours. Urinalysis    Component Value Date/Time   COLORURINE YELLOW 08/22/2022 0115  APPEARANCEUR HAZY (A) 08/22/2022 0115   LABSPEC 1.020 08/22/2022 0115   PHURINE 5.0 08/22/2022 0115   GLUCOSEU NEGATIVE 08/22/2022 0115   HGBUR LARGE (A) 08/22/2022 0115   BILIRUBINUR NEGATIVE 08/22/2022 0115   KETONESUR NEGATIVE 08/22/2022 0115   PROTEINUR 100 (A) 08/22/2022 0115    NITRITE NEGATIVE 08/22/2022 0115   LEUKOCYTESUR NEGATIVE 08/22/2022 0115   Sepsis Labs Recent Labs  Lab 08/22/22 0500 08/22/22 1336 08/23/22 0500 08/24/22 0601  WBC 1.0* 1.1* 2.5* 9.0   Microbiology Recent Results (from the past 240 hour(s))  Urine Culture (for pregnant, neutropenic or urologic patients or patients with an indwelling urinary catheter)     Status: None   Collection Time: 08/18/22  4:41 PM   Specimen: Urine, Catheterized  Result Value Ref Range Status   Specimen Description   Final    URINE, CATHETERIZED Performed at [facility], [place] [address] [place], [place] [address]    Special Requests   Final    Immunocompromised Performed at [facility], [place] [address] [place], [place] [address]    Culture   Final    NO GROWTH Performed at [facility], [place] [address] [place], [place] [address]    Report Status 08/19/2022 FINAL  Final  Culture, blood (routine x 2)     Status: None   Collection Time: 08/18/22  6:45 PM   Specimen: BLOOD  Result Value Ref Range Status   Specimen Description   Final    BLOOD RIGHT ANTECUBITAL Performed at [place] [address], [place], [place] [address]    Special Requests   Final    BOTTLES DRAWN AEROBIC AND ANAEROBIC Blood Culture adequate volume Performed at [place] [address] [place], [place] [address]    Culture   Final    NO GROWTH 5 DAYS Performed at [facility], [place] [address], [place], [place] [address]    Report Status 08/23/2022 FINAL  Final  Culture, blood (routine x 2)     Status: None   Collection Time: 08/18/22  6:55 PM   Specimen: BLOOD  Result Value Ref Range Status   Specimen Description   Final    BLOOD LEFT ANTECUBITAL Performed at [place] [address] [place], [place] [address]    Special Requests   Final    BOTTLES DRAWN AEROBIC AND ANAEROBIC Blood Culture results  may not be optimal due to an excessive volume of blood received in culture bottles Performed at [facility] [place] [address] [place], [place] [address]    Culture   Final    NO GROWTH 5 DAYS Performed at [facility], [place] [address] [place], [place] [address]    Report Status 08/23/2022 FINAL  Final  Blood Culture (routine x 2)     Status: None (Preliminary result)   Collection Time: 08/21/22 11:40 PM   Specimen: BLOOD  Result Value Ref Range Status   Specimen Description   Final    BLOOD BLOOD LEFT FOREARM Performed at [place] [address], [place], [place] [address]    Special Requests   Final    BOTTLES DRAWN AEROBIC AND ANAEROBIC Blood Culture adequate volume Performed at [place] [address], [place], [place] [address]    Culture   Final    NO GROWTH 2 DAYS Performed at [place],  [place] [address]    Report Status PENDING  Incomplete  Blood Culture (routine x 2)     Status: None (Preliminary result)   Collection Time: 08/21/22 11:51 PM   Specimen: BLOOD  Result Value Ref Range Status   Specimen Description   Final    BLOOD BLOOD LEFT FOREARM Performed at [place] [address] [place], [place] [address]    Special Requests   Final    BOTTLES DRAWN AEROBIC AND ANAEROBIC Blood Culture adequate volume Performed at [facility] [place] [address] [place], [place] [address]    Culture   Final    NO GROWTH 2 DAYS Performed at [place] [address] [place], [place] [address]    Report Status PENDING  Incomplete  Resp panel by RT-PCR (RSV, Flu A&B, Covid) Anterior Nasal Swab     Status: None   Collection Time: 08/22/22  1:11 AM   Specimen: Anterior Nasal Swab  Result Value Ref Range Status   SARS Coronavirus 2 by RT PCR NEGATIVE NEGATIVE Final    Comment: (NOTE) SARS-CoV-2 target nucleic acids are NOT  DETECTED.  The SARS-CoV-2 RNA is generally detectable in upper respiratory specimens during the acute phase of infection. The lowest concentration of SARS-CoV-2 viral copies this assay can detect is 138 copies/mL. A negative result does not preclude SARS-Cov-2 infection and should not be used as the sole basis for treatment or other patient management decisions. A negative result may occur with  improper specimen collection/handling, submission of specimen other than nasopharyngeal swab, presence of viral mutation(s) within the areas targeted by this assay, and inadequate number of viral copies(<138 copies/mL). A negative result must be combined with clinical observations, patient history, and epidemiological information. The expected result is Negative.  Fact Sheet for Patients:  EntrepreneurPulse.com.au  Fact Sheet for Healthcare Providers:  IncredibleEmployment.be  This test is no t yet approved or cleared by the [place] FDA and  has been authorized for detection and/or diagnosis of SARS-CoV-2 by FDA under an Emergency Use Authorization (EUA). This EUA will remain  in effect (meaning this test can be used) for the duration of the COVID-19 declaration under Section 564(b)(1) of the Act, 21 U.S.C.section 360bbb-3(b)(1), unless the authorization is terminated  or revoked sooner.       Influenza A by PCR NEGATIVE NEGATIVE Final   Influenza B by PCR NEGATIVE NEGATIVE Final    Comment: (NOTE) The Xpert Xpress SARS-CoV-2/FLU/RSV plus assay is intended as an aid in the diagnosis of influenza from Nasopharyngeal swab specimens and should not be used as a sole basis for treatment. Nasal washings and aspirates are unacceptable for Xpert Xpress SARS-CoV-2/FLU/RSV testing.  Fact Sheet for Patients: EntrepreneurPulse.com.au  Fact Sheet for Healthcare Providers: IncredibleEmployment.be  This test is not yet  approved or cleared by the [place] FDA and has been authorized for detection and/or diagnosis of SARS-CoV-2 by FDA under an Emergency Use Authorization (EUA). This EUA will remain in effect (meaning this test can be used) for the duration of the COVID-19 declaration under Section 564(b)(1) of the Act, 21 U.S.C. section 360bbb-3(b)(1), unless the authorization is terminated or revoked.     Resp Syncytial Virus by PCR NEGATIVE NEGATIVE Final    Comment: (NOTE) Fact Sheet for Patients: EntrepreneurPulse.com.au  Fact Sheet for Healthcare Providers: IncredibleEmployment.be  This test is not yet approved or cleared by the [place] FDA and has been authorized for detection and/or diagnosis of SARS-CoV-2 by FDA  under an Emergency Use Authorization (EUA). This EUA will remain in effect (meaning this test can be used) for the duration of the COVID-19 declaration under Section 564(b)(1) of the Act, 21 U.S.C. section 360bbb-3(b)(1), unless the authorization is terminated or revoked.  Performed at [facility], [place] [address] [place], [place] [address]   Urine Culture (for pregnant, neutropenic or urologic patients or patients with an indwelling urinary catheter)     Status: None   Collection Time: 08/22/22  1:16 AM   Specimen: Urine, Clean Catch  Result Value Ref Range Status   Specimen Description   Final    URINE, CLEAN CATCH Performed at [facility], [place] [address] [place], [place] [address]    Special Requests   Final    NONE Performed at [facility], [place] [address] [place], [place] [address]    Culture   Final    NO GROWTH Performed at [facility], [place] [address], [place], [place] [address]    Report Status 08/23/2022 FINAL  Final     Patient was seen and examined on the day of discharge and was found to be in stable condition. Time coordinating discharge: 35  minutes including assessment and coordination of care, as well as examination of the patient.   SIGNED:  [name], DO [facility] 08/24/2022, 12:05 PM

## 2022-08-24 NOTE — Plan of Care (Signed)
Patient AOX4, VSS throughout shift. Pt c/o pain relieved by PRN pain med. All meds given on time as ordered. Pt c/o anxiety relieved by deep breathing and atarax.  Foley care provided, output good. Diminished lungs, IS encouraged. POC maintained, will continue to monitor.   Problem: Education: Goal: Knowledge of General Education information will improve Description: Including pain rating scale, medication(s)/side effects and non-pharmacologic comfort measures Outcome: Progressing   Problem: Health Behavior/Discharge Planning: Goal: Ability to manage health-related needs will improve Outcome: Progressing   Problem: Clinical Measurements: Goal: Ability to maintain clinical measurements within normal limits will improve Outcome: Progressing Goal: Will remain free from infection Outcome: Progressing Goal: Diagnostic test results will improve Outcome: Progressing Goal: Respiratory complications will improve Outcome: Progressing Goal: Cardiovascular complication will be avoided Outcome: Progressing   Problem: Activity: Goal: Risk for activity intolerance will decrease Outcome: Progressing   Problem: Nutrition: Goal: Adequate nutrition will be maintained Outcome: Progressing   Problem: Coping: Goal: Level of anxiety will decrease Outcome: Progressing   Problem: Elimination: Goal: Will not experience complications related to bowel motility Outcome: Progressing Goal: Will not experience complications related to urinary retention Outcome: Progressing   Problem: Pain Managment: Goal: General experience of comfort will improve Outcome: Progressing   Problem: Safety: Goal: Ability to remain free from injury will improve Outcome: Progressing   Problem: Skin Integrity: Goal: Risk for impaired skin integrity will decrease Outcome: Progressing

## 2022-08-24 NOTE — Progress Notes (Signed)
Pt left the navigator a message wanting to update her. Upon reviewing patient's chart, navigator now aware pt had been admitted for neutropenic fever. Navigator returned pt's phone call to address pts concerns. Pt stated he just wanted to update his oncology team on what was going on. He wanted to make sure that Dr Julien Nordmann knew what his labs looked like and that he was supposed to have his foley catheter out today. Navigator appreciated pt update and assured the pt Navigator will be reaching out to his oncology team to inform them of the situation.  Upon hanging up with the pt, navigator alerted PA Heilingoetter via secure chat about the pt's current admission. PA Heilingoetter verbalized confirmation of update.

## 2022-08-24 NOTE — Progress Notes (Signed)
Pt to be discharged with foley catheter.  Leg bag given for discharge.  Port deaccessed per policy.

## 2022-08-25 ENCOUNTER — Telehealth: Payer: Self-pay | Admitting: *Deleted

## 2022-08-25 DIAGNOSIS — R3912 Poor urinary stream: Secondary | ICD-10-CM | POA: Diagnosis not present

## 2022-08-25 NOTE — Transitions of Care (Post Inpatient/ED Visit) (Signed)
   08/25/2022  Name: Larry Kent MRN: [id number] DOB: [birth year]  Today's TOC FU Call Status: Today's TOC FU Call Status:: Successful TOC FU Call Competed TOC FU Call Complete Date: 08/25/22  Transition Care Management Follow-up Telephone Call Date of Discharge: 08/24/22 Discharge Facility: [facility]) Type of Discharge: Inpatient Admission Primary Inpatient Discharge Diagnosis:: Neutropenic fever How have you been since you were released from the hospital?: Better (doing OK) Any questions or concerns?: No  Items Reviewed: Did you receive and understand the discharge instructions provided?: Yes Medications obtained and verified?: Yes (Medications Reviewed) (stopped aspirin) Any new allergies since your discharge?: No Dietary orders reviewed?: No Do you have support at home?: Yes People in Home: child(ren), adult, spouse Name of Support/Comfort Primary Source: Larry Kent son and Larry Kent spouse  Home Care and Equipment/Supplies: [place] Ordered?: No Any new equipment or medical supplies ordered?: No  Functional Questionnaire: Do you need assistance with bathing/showering or dressing?: No Do you need assistance with meal preparation?: No Do you need assistance with eating?: No Do you have difficulty maintaining continence: No Do you need assistance with getting out of bed/getting out of a chair/moving?: No Do you have difficulty managing or taking your medications?: No  Folllow up appointments reviewed: PCP Follow-up appointment confirmed?: NA Specialist Hospital Follow-up appointment confirmed?: Yes Date of Specialist follow-up appointment?: 08/25/22 Follow-Up Specialty Provider:: [id number] DR Larry Kent, [id number] 10:30 Dr Larry Dubin Do you need transportation to your follow-up appointment?: No Do you understand care options if your condition(s) worsen?: Yes-patient verbalized understanding  SDOH Interventions Today    Flowsheet Row Most Recent Value   SDOH Interventions   Food Insecurity Interventions Intervention Not Indicated  Housing Interventions Intervention Not Indicated  Transportation Interventions Intervention Not Indicated      Interventions Today    Flowsheet Row Most Recent Value  Nutrition Interventions   Nutrition Discussed/Reviewed Nutrition Discussed  [RN discussed UHC meal plan and how to access it.]  Pharmacy Interventions   Pharmacy Dicussed/Reviewed Pharmacy Topics Discussed  [RN made sure patient aware to stop the aspirin]       [facility] [phone]

## 2022-08-26 ENCOUNTER — Telehealth: Payer: Self-pay | Admitting: *Deleted

## 2022-08-26 ENCOUNTER — Telehealth: Payer: Self-pay | Admitting: Family Medicine

## 2022-08-26 NOTE — Telephone Encounter (Signed)
Pt called to let Dr. Darnell Level know he was discharged from the [facility] on Mon, 2/26 for neutropenia & fever. Pt stated he didn't know if a hosp f/u was required. Call back # [phone]

## 2022-08-26 NOTE — Patient Outreach (Signed)
  Care Coordination   Follow Up Visit Note   08/26/2022 Name: Larry Kent MRN: [id number] DOB: 08-02-1945  Larry Kent is a 78 y.o. year old male who sees Ria Bush, MD for primary care. I spoke with  Hassan Rowan by phone today.  RN received Telephone call to go ahead an initiate the meal plan.    Care Coordination Interventions:  Yes, provided  RN explained to patient how to call [facility] customer service to the meal plan initiated.  Follow up plan: No further intervention required.   Encounter Outcome:  Pt. Visit Completed   [place] [phone]

## 2022-08-26 NOTE — Telephone Encounter (Addendum)
Callback # is incomplete. Correct # is [phone] cell. I called pt's cell in chart and spoke with him. No need for hosp f/u visit.

## 2022-08-27 ENCOUNTER — Inpatient Hospital Stay: Payer: Medicare Other

## 2022-08-27 ENCOUNTER — Encounter: Payer: Self-pay | Admitting: Gastroenterology

## 2022-08-27 ENCOUNTER — Other Ambulatory Visit: Payer: Self-pay

## 2022-08-27 DIAGNOSIS — R918 Other nonspecific abnormal finding of lung field: Secondary | ICD-10-CM | POA: Diagnosis not present

## 2022-08-27 DIAGNOSIS — C3490 Malignant neoplasm of unspecified part of unspecified bronchus or lung: Secondary | ICD-10-CM

## 2022-08-27 DIAGNOSIS — Z5111 Encounter for antineoplastic chemotherapy: Secondary | ICD-10-CM | POA: Diagnosis not present

## 2022-08-27 DIAGNOSIS — R590 Localized enlarged lymph nodes: Secondary | ICD-10-CM | POA: Diagnosis not present

## 2022-08-27 DIAGNOSIS — C7A1 Malignant poorly differentiated neuroendocrine tumors: Secondary | ICD-10-CM

## 2022-08-27 DIAGNOSIS — C7951 Secondary malignant neoplasm of bone: Secondary | ICD-10-CM | POA: Diagnosis not present

## 2022-08-27 DIAGNOSIS — Z95828 Presence of other vascular implants and grafts: Secondary | ICD-10-CM

## 2022-08-27 DIAGNOSIS — C61 Malignant neoplasm of prostate: Secondary | ICD-10-CM | POA: Diagnosis present

## 2022-08-27 DIAGNOSIS — Z79899 Other long term (current) drug therapy: Secondary | ICD-10-CM | POA: Diagnosis not present

## 2022-08-27 LAB — CULTURE, BLOOD (ROUTINE X 2)
Culture: NO GROWTH
Culture: NO GROWTH
Special Requests: ADEQUATE
Special Requests: ADEQUATE

## 2022-08-27 LAB — CBC WITH DIFFERENTIAL (CANCER CENTER ONLY)
Abs Immature Granulocytes: 2.27 10*3/uL — ABNORMAL HIGH (ref 0.00–0.07)
Basophils Absolute: 0.1 10*3/uL (ref 0.0–0.1)
Basophils Relative: 1 %
Eosinophils Absolute: 0 10*3/uL (ref 0.0–0.5)
Eosinophils Relative: 0 %
HCT: 27.4 % — ABNORMAL LOW (ref 39.0–52.0)
Hemoglobin: 9.4 g/dL — ABNORMAL LOW (ref 13.0–17.0)
Immature Granulocytes: 17 %
Lymphocytes Relative: 8 %
Lymphs Abs: 1.1 10*3/uL (ref 0.7–4.0)
MCH: 34.7 pg — ABNORMAL HIGH (ref 26.0–34.0)
MCHC: 34.3 g/dL (ref 30.0–36.0)
MCV: 101.1 fL — ABNORMAL HIGH (ref 80.0–100.0)
Monocytes Absolute: 1.5 10*3/uL — ABNORMAL HIGH (ref 0.1–1.0)
Monocytes Relative: 12 %
Neutro Abs: 8.2 10*3/uL — ABNORMAL HIGH (ref 1.7–7.7)
Neutrophils Relative %: 62 %
Platelet Count: 129 10*3/uL — ABNORMAL LOW (ref 150–400)
RBC: 2.71 MIL/uL — ABNORMAL LOW (ref 4.22–5.81)
RDW: 12.7 % (ref 11.5–15.5)
Smear Review: NORMAL
WBC Count: 13.2 10*3/uL — ABNORMAL HIGH (ref 4.0–10.5)
nRBC: 0.2 % (ref 0.0–0.2)

## 2022-08-27 LAB — CMP (CANCER CENTER ONLY)
ALT: 32 U/L (ref 0–44)
AST: 28 U/L (ref 15–41)
Albumin: 3.4 g/dL — ABNORMAL LOW (ref 3.5–5.0)
Alkaline Phosphatase: 175 U/L — ABNORMAL HIGH (ref 38–126)
Anion gap: 6 (ref 5–15)
BUN: 14 mg/dL (ref 8–23)
CO2: 27 mmol/L (ref 22–32)
Calcium: 8.5 mg/dL — ABNORMAL LOW (ref 8.9–10.3)
Chloride: 105 mmol/L (ref 98–111)
Creatinine: 0.75 mg/dL (ref 0.61–1.24)
GFR, Estimated: >60 mL/min (ref >60)
Glucose, Bld: 115 mg/dL — ABNORMAL HIGH (ref 70–99)
Potassium: 3.9 mmol/L (ref 3.5–5.1)
Sodium: 138 mmol/L (ref 135–145)
Total Bilirubin: 0.3 mg/dL (ref 0.3–1.2)
Total Protein: 6.1 g/dL — ABNORMAL LOW (ref 6.5–8.1)

## 2022-08-27 LAB — SAMPLE TO BLOOD BANK

## 2022-08-27 MED ORDER — HEPARIN SOD (PORK) LOCK FLUSH 100 UNIT/ML IV SOLN
500.0000 [IU] | Freq: Once | INTRAVENOUS | Status: AC
  Administered 2022-08-27: 500 [IU]

## 2022-08-27 MED ORDER — SODIUM CHLORIDE 0.9% FLUSH
10.0000 mL | Freq: Once | INTRAVENOUS | Status: AC
  Administered 2022-08-27: 10 mL

## 2022-08-28 MED FILL — Fosaprepitant Dimeglumine For IV Infusion 150 MG (Base Eq): INTRAVENOUS | Qty: 5 | Status: AC

## 2022-08-28 MED FILL — Dexamethasone Sodium Phosphate Inj 100 MG/10ML: INTRAMUSCULAR | Qty: 1 | Status: AC

## 2022-08-31 ENCOUNTER — Inpatient Hospital Stay: Payer: Medicare Other | Admitting: Internal Medicine

## 2022-08-31 ENCOUNTER — Inpatient Hospital Stay: Payer: Medicare Other

## 2022-08-31 ENCOUNTER — Inpatient Hospital Stay: Payer: Medicare Other | Attending: Nurse Practitioner

## 2022-08-31 ENCOUNTER — Other Ambulatory Visit: Payer: Self-pay

## 2022-08-31 VITALS — BP 127/66 | HR 70 | Temp 98.5°F | Resp 15

## 2022-08-31 VITALS — BP 144/72 | HR 86 | Temp 100.7°F | Resp 16 | Wt 181.2 lb

## 2022-08-31 DIAGNOSIS — C61 Malignant neoplasm of prostate: Secondary | ICD-10-CM | POA: Insufficient documentation

## 2022-08-31 DIAGNOSIS — C7A1 Malignant poorly differentiated neuroendocrine tumors: Secondary | ICD-10-CM

## 2022-08-31 DIAGNOSIS — C3490 Malignant neoplasm of unspecified part of unspecified bronchus or lung: Secondary | ICD-10-CM | POA: Diagnosis not present

## 2022-08-31 DIAGNOSIS — Z5111 Encounter for antineoplastic chemotherapy: Secondary | ICD-10-CM | POA: Insufficient documentation

## 2022-08-31 DIAGNOSIS — Z95828 Presence of other vascular implants and grafts: Secondary | ICD-10-CM

## 2022-08-31 DIAGNOSIS — Z79899 Other long term (current) drug therapy: Secondary | ICD-10-CM | POA: Insufficient documentation

## 2022-08-31 LAB — CBC WITH DIFFERENTIAL (CANCER CENTER ONLY)
Abs Immature Granulocytes: 0.45 10*3/uL — ABNORMAL HIGH (ref 0.00–0.07)
Basophils Absolute: 0.1 10*3/uL (ref 0.0–0.1)
Basophils Relative: 1 %
Eosinophils Absolute: 0 10*3/uL (ref 0.0–0.5)
Eosinophils Relative: 0 %
HCT: 26.8 % — ABNORMAL LOW (ref 39.0–52.0)
Hemoglobin: 9.5 g/dL — ABNORMAL LOW (ref 13.0–17.0)
Immature Granulocytes: 6 %
Lymphocytes Relative: 10 %
Lymphs Abs: 0.8 10*3/uL (ref 0.7–4.0)
MCH: 35.7 pg — ABNORMAL HIGH (ref 26.0–34.0)
MCHC: 35.4 g/dL (ref 30.0–36.0)
MCV: 100.8 fL — ABNORMAL HIGH (ref 80.0–100.0)
Monocytes Absolute: 0.8 10*3/uL (ref 0.1–1.0)
Monocytes Relative: 11 %
Neutro Abs: 5.5 10*3/uL (ref 1.7–7.7)
Neutrophils Relative %: 72 %
Platelet Count: 231 10*3/uL (ref 150–400)
RBC: 2.66 MIL/uL — ABNORMAL LOW (ref 4.22–5.81)
RDW: 13.2 % (ref 11.5–15.5)
WBC Count: 7.6 10*3/uL (ref 4.0–10.5)
nRBC: 0 % (ref 0.0–0.2)

## 2022-08-31 LAB — CMP (CANCER CENTER ONLY)
ALT: 34 U/L (ref 0–44)
AST: 32 U/L (ref 15–41)
Albumin: 3.5 g/dL (ref 3.5–5.0)
Alkaline Phosphatase: 152 U/L — ABNORMAL HIGH (ref 38–126)
Anion gap: 5 (ref 5–15)
BUN: 16 mg/dL (ref 8–23)
CO2: 28 mmol/L (ref 22–32)
Calcium: 8.7 mg/dL — ABNORMAL LOW (ref 8.9–10.3)
Chloride: 104 mmol/L (ref 98–111)
Creatinine: 0.89 mg/dL (ref 0.61–1.24)
GFR, Estimated: >60 mL/min (ref >60)
Glucose, Bld: 143 mg/dL — ABNORMAL HIGH (ref 70–99)
Potassium: 3.9 mmol/L (ref 3.5–5.1)
Sodium: 137 mmol/L (ref 135–145)
Total Bilirubin: 0.3 mg/dL (ref 0.3–1.2)
Total Protein: 6.6 g/dL (ref 6.5–8.1)

## 2022-08-31 LAB — SAMPLE TO BLOOD BANK

## 2022-08-31 MED ORDER — SODIUM CHLORIDE 0.9 % IV SOLN
80.0000 mg/m2 | Freq: Once | INTRAVENOUS | Status: AC
  Administered 2022-08-31: 158 mg via INTRAVENOUS
  Filled 2022-08-31: qty 7.9

## 2022-08-31 MED ORDER — SODIUM CHLORIDE 0.9 % IV SOLN
10.0000 mg | Freq: Once | INTRAVENOUS | Status: AC
  Administered 2022-08-31: 10 mg via INTRAVENOUS
  Filled 2022-08-31: qty 10

## 2022-08-31 MED ORDER — SODIUM CHLORIDE 0.9 % IV SOLN
1500.0000 mg | Freq: Once | INTRAVENOUS | Status: AC
  Administered 2022-08-31: 1500 mg via INTRAVENOUS
  Filled 2022-08-31: qty 30

## 2022-08-31 MED ORDER — ACETAMINOPHEN 325 MG PO TABS
650.0000 mg | ORAL_TABLET | Freq: Once | ORAL | Status: AC
  Administered 2022-08-31: 650 mg via ORAL
  Filled 2022-08-31: qty 2

## 2022-08-31 MED ORDER — SODIUM CHLORIDE 0.9% FLUSH
10.0000 mL | INTRAVENOUS | Status: DC | PRN
  Administered 2022-08-31: 10 mL

## 2022-08-31 MED ORDER — HEPARIN SOD (PORK) LOCK FLUSH 100 UNIT/ML IV SOLN
500.0000 [IU] | Freq: Once | INTRAVENOUS | Status: AC | PRN
  Administered 2022-08-31: 500 [IU]

## 2022-08-31 MED ORDER — SODIUM CHLORIDE 0.9% FLUSH
10.0000 mL | Freq: Once | INTRAVENOUS | Status: AC
  Administered 2022-08-31: 10 mL

## 2022-08-31 MED ORDER — SODIUM CHLORIDE 0.9 % IV SOLN
391.6000 mg | Freq: Once | INTRAVENOUS | Status: AC
  Administered 2022-08-31: 400 mg via INTRAVENOUS
  Filled 2022-08-31: qty 40

## 2022-08-31 MED ORDER — PALONOSETRON HCL INJECTION 0.25 MG/5ML
0.2500 mg | Freq: Once | INTRAVENOUS | Status: AC
  Administered 2022-08-31: 0.25 mg via INTRAVENOUS
  Filled 2022-08-31: qty 5

## 2022-08-31 MED ORDER — SODIUM CHLORIDE 0.9 % IV SOLN
150.0000 mg | Freq: Once | INTRAVENOUS | Status: AC
  Administered 2022-08-31: 150 mg via INTRAVENOUS
  Filled 2022-08-31: qty 150

## 2022-08-31 MED ORDER — SODIUM CHLORIDE 0.9 % IV SOLN: Freq: Once | INTRAVENOUS | Status: AC

## 2022-08-31 NOTE — Progress Notes (Signed)
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 Junious Silk   PRIOR THERAPY: None   CURRENT THERAPY:  Palliative systemic chemotherapy with carboplatin for AUC of 5 and Imfinzi 1500 Mg IV on day 1 as well as etoposide 100 Mg/M2 on days 1, 2 and 3 with Neulasta support on day 5. Status post 1 cycle. First dose on 08/11/22.  Starting from cycle #2 his carboplatin will be reduced to AUC of 4 and 2 etoposide 80 Mg/M2 secondary to intolerance and cycle #1.  INTERVAL HISTORY: Larry Kent 77 y.o. male returns to the clinic today for follow-up visit accompanied by his wife.  The patient is feeling fine today with no concerning complaints except for low-grade fever that was detected during his vital signs today.  He has no symptoms from the fever.  He denied having any chest pain, shortness of breath except with exertion with no cough or hemoptysis.  He has no nausea, vomiting, diarrhea or constipation.  He has no headache or visual changes.  He has no recent weight loss or night sweats.  He had prostate biopsy on August 12, 2022 and it was consistent with prostate adenocarcinoma with high-grade features.  He was seen by Dr. Junious Silk and expected to start hormonal therapy soon.  He is here today for evaluation before starting cycle #2.  MEDICAL HISTORY: Past Medical History:  Diagnosis Date   Allergy     Arthritis    both knees    BCC (basal cell carcinoma), arm, right 09/2019   MOHS ([place])   BCC (basal cell carcinoma), face 2014   L preauricular s/p MOHS   BCC (basal cell carcinoma), face 02/2018   L upper lip   BPH (benign prostatic hypertrophy)    Dr. Junious Silk @ [facility]   Carotid stenosis [id number]   RICA [id number], LICA 123456, L vertebral occlusion, rpt 1 yr   Cataract    removed both eyes    FHx: colon cancer    FHx: prostate cancer    Heart murmur    mild aortic stenosis    History of kidney stones    Hyperlipidemia    borderline- on rosuvastatin now normal    Occlusion of right vertebral artery 05/2015    ALLERGIES:  has No Known Allergies.  MEDICATIONS:  Current Outpatient Medications  Medication Sig Dispense Refill   acetaminophen (TYLENOL) 500 MG tablet Take 1,000 mg by mouth every 6 (six) hours as needed for mild pain or headache.     fluticasone (FLONASE) 50 MCG/ACT nasal spray Place 1 spray into both nostrils daily as needed for allergies or rhinitis.     lidocaine-prilocaine (EMLA) cream Apply to the Port-A-Cath site 30-60 minutes before chemotherapy treatment (Patient taking differently: Apply 1 Application topically See admin instructions. Apply to the Port-A-Cath site 30-60 minutes before chemotherapy treatment) 30 g 0   Multiple Vitamin (MULTIVITAMIN) tablet Take 1 tablet by mouth daily with breakfast.     prochlorperazine (COMPAZINE) 10 MG tablet Take  1 tablet (10 mg total) by mouth every 6 (six) hours as needed for nausea or vomiting. 30 tablet 0   rosuvastatin (CRESTOR) 20 MG tablet TAKE 1 TABLET BY MOUTH EVERY DAY (Patient taking differently: Take 20 mg by mouth in the morning.) 90 tablet 1   solifenacin (VESICARE) 5 MG tablet Take 5 mg by mouth daily.     SYSTANE ULTRA PF 0.4-0.3 % SOLN Place 1 drop into both eyes 3 (three) times daily as needed (for dryness/irritation).     Zinc 25 MG TABS Take 25 mg by mouth daily.     No current facility-administered  medications for this visit.    SURGICAL HISTORY:  Past Surgical History:  Procedure Laterality Date   BASAL CELL CARCINOMA EXCISION  05/2018   lip, 2020 x3  basal cells removed    CATARACT EXTRACTION  2008   Left   CATARACT EXTRACTION  2013   Right ([facility])   COLONOSCOPY  ~2010   medium int hemorrhoids, o/w WNL, rpt 5 yrs given fmhx (Dr. Oletta Lamas)   COLONOSCOPY  08/2019   TAs, diverticulosis, rpt 3 yrs ([place])   ENDARTERECTOMY Right 12/01/2019   Procedure: RIGHT ENDARTERECTOMY CAROTID;  Surgeon: [name], MD;  Location: [facility];  Service: Vascular;  Laterality: Right;   exercise treadmill  11/2005   WNL [place])   FINE NEEDLE ASPIRATION  07/28/2022   Procedure: FINE NEEDLE ASPIRATION (FNA) LINEAR;  Surgeon: Garner Nash, DO;  Location: [facility];  Service: Pulmonary;;   HERNIA REPAIR  2008   Right   IR IMAGING GUIDED PORT INSERTION  08/11/2022   MOHS SURGERY Left spring 2014   basal cell face   PATCH ANGIOPLASTY Right 12/01/2019   Procedure: [place];  Surgeon: [name], MD;  Location: [facility];  Service: Vascular;  Laterality: Right;   Testicular Biopsy  2003   benign, varicocele   US ECHOCARDIOGRAPHY  11/2005   aortic sclerosis, EF 0000000, diastolic dysfunction   VIDEO BRONCHOSCOPY WITH ENDOBRONCHIAL ULTRASOUND Bilateral 07/28/2022   Procedure: VIDEO BRONCHOSCOPY WITH ENDOBRONCHIAL ULTRASOUND;  Surgeon: Garner Nash, DO;  Location: [facility];  Service: Pulmonary;  Laterality: Bilateral;    REVIEW OF SYSTEMS:  Constitutional: positive for fatigue Eyes: negative Ears, nose, mouth, throat, and face: negative Respiratory: positive for dyspnea on exertion Cardiovascular: negative Gastrointestinal: negative Genitourinary:negative Integument/breast: negative Hematologic/lymphatic: negative Musculoskeletal:negative Neurological: negative Behavioral/Psych: negative Endocrine: negative Allergic/Immunologic:  negative   PHYSICAL EXAMINATION: General appearance: alert, cooperative, fatigued, and no distress Head: Normocephalic, without obvious abnormality, atraumatic Neck: no adenopathy, no JVD, supple, symmetrical, trachea midline, and thyroid not enlarged, symmetric, no tenderness/mass/nodules Lymph nodes: Cervical, supraclavicular, and axillary nodes normal. Resp: clear to auscultation bilaterally Back: symmetric, no curvature. ROM normal. No CVA tenderness. Cardio: regular rate and rhythm, S1, S2 normal, no murmur, click, rub or gallop GI: soft, non-tender; bowel sounds normal; no masses,  no organomegaly Extremities: extremities normal, atraumatic, no cyanosis or edema Neurologic: Alert and oriented X 3, normal strength and tone. Normal symmetric reflexes. Normal coordination and gait  ECOG PERFORMANCE STATUS: 1 - Symptomatic but completely ambulatory  Blood pressure (!) 144/72, pulse 86, temperature (!) 100.7 F (38.2 C), temperature source Oral, resp. rate 16, weight 181 lb 3.2 oz (82.2 kg), SpO2 100 %.  LABORATORY DATA: Lab Results  Component Value Date   WBC 7.6 08/31/2022   HGB 9.5 (L) 08/31/2022   HCT 26.8 (L) 08/31/2022   MCV 100.8 (H) 08/31/2022  PLT 231 08/31/2022      Chemistry      Component Value Date/Time   NA 138 08/27/2022 1044   K 3.9 08/27/2022 1044   CL 105 08/27/2022 1044   CO2 27 08/27/2022 1044   BUN 14 08/27/2022 1044   CREATININE 0.75 08/27/2022 1044      Component Value Date/Time   CALCIUM 8.5 (L) 08/27/2022 1044   ALKPHOS 175 (H) 08/27/2022 1044   AST 28 08/27/2022 1044   ALT 32 08/27/2022 1044   BILITOT 0.3 08/27/2022 1044       RADIOGRAPHIC STUDIES: DG Chest 2 View  Result Date: 08/22/2022 CLINICAL DATA:  Questionable sepsis.  Multiple pulmonary metastases. EXAM: CHEST - 2 VIEW COMPARISON:  CT abdomen and pelvis no contrast 08/18/2022, CT chest with contrast 07/15/2022 FINDINGS: Heart size and vasculature are normal apart from  calcification in the transverse aorta. There is a right chest port newly noted with IJ approach catheter terminating in the distal SVC. There are numerous nodular pulmonary metastases predominating in the lower zones. Largest on the left is in the upper lobe measuring 1.7 cm. Largest on the right is 2 cm in the middle lobe. Again noted is a small left pleural effusion. No focal pneumonia is seen. The right sulci are sharp. There are degenerative changes and mild kyphodextroscoliosis of the thoracic spine with osteopenia. No thoracic compression fractures. IMPRESSION: 1. Numerous nodular pulmonary metastases. 2. Small left pleural effusion. 3. No evidence of pneumonia or edema. 4. Aortic atherosclerosis. 5. Right chest port newly noted. Electronically Signed   By: Telford Nab M.D.   On: 08/22/2022 00:34   CT ABDOMEN PELVIS WO CONTRAST  Result Date: 08/18/2022 CLINICAL DATA:  Abdominal pain, acute, nonlocalized EXAM: CT ABDOMEN AND PELVIS WITHOUT CONTRAST TECHNIQUE: Multidetector CT imaging of the abdomen and pelvis was performed following the standard protocol without IV contrast. RADIATION DOSE REDUCTION: This exam was performed according to the departmental dose-optimization program which includes automated exposure control, adjustment of the mA and/or kV according to patient size and/or use of iterative reconstruction technique. COMPARISON:  PET-CT 07/15/2022 FINDINGS: Lower chest: Stable small left pleural effusion. Coronary calcifications. Innumerable pulmonary nodules. Largest 2.8 cm in the right middle lobe, previously 2.4 cm. Largest left nodule in the anterior basal segment left lower lobe image 20/3 2.1 cm, previously 1.9. Hepatobiliary: No focal liver abnormality is seen. No gallstones, gallbladder wall thickening, or biliary dilatation. Pancreas: Diffuse parenchymal atrophy without mass or ductal dilatation. No regional inflammatory change. Spleen: Normal in size without focal abnormality.  Adrenals/Urinary Tract: No adrenal mass. Left nephrolithiasis, largest stone 3 mm midpole. There is mild bilateral hydronephrosis right greater than left and ureterectasis down to thick-walled urinary bladder, partially decompressed by Foley catheter. Stomach/Bowel: Stomach is nondistended. Small bowel decompressed. Normal appendix. Colon is partially distended by gas and fecal material, with scattered distal descending and sigmoid diverticula; no adjacent inflammatory change. Vascular/Lymphatic: Moderate scattered calcified aortoiliac calcified plaque without aneurysm. Retroaortic left renal vein, anatomic variant. No abdominal or pelvic adenopathy. Reproductive: Prostate enlargement with coarse calcifications Other: No ascites.  No free air. Musculoskeletal: Mild lumbar dextroscoliosis apex L3 with multilevel spondylitic change. Sclerotic lesions in bilateral inferior ischiopubic rami, left sacrum. For no fracture or acute process. IMPRESSION: 1. Mild bilateral hydronephrosis and ureterectasis down to thick-walled urinary bladder, partially decompressed by Foley catheter. 2. Right nephrolithiasis. 3. Innumerable pulmonary nodules, some of which have increased in size. 4. Stable sacral and pelvic bone lesions. 5. Stable small left pleural  effusion. 6. Descending and sigmoid diverticulosis. 7.  Aortic Atherosclerosis (ICD10-I70.0). Electronically Signed   By: Lucrezia Europe M.D.   On: 08/18/2022 17:52   IR IMAGING GUIDED PORT INSERTION  Result Date: 08/11/2022 INDICATION: 77 year old male referred for port catheter EXAM: IMAGE GUIDED PORT CATHETER MEDICATIONS: None ANESTHESIA/SEDATION: Moderate (conscious) sedation was employed during this procedure. A total of Versed 2.0 mg and Fentanyl 100 mcg was administered intravenously. Moderate Sedation Time: 18 minutes. The patient's level of consciousness and vital signs were monitored continuously by radiology nursing throughout the procedure under my direct supervision.  FLUOROSCOPY TIME:  Fluoroscopy Time:   (1 mGy). COMPLICATIONS: None PROCEDURE: Informed written consent was obtained from the patient after a thorough discussion of the procedural risks, benefits and alternatives. All questions were addressed. Maximal Sterile Barrier Technique was utilized including caps, mask, sterile gowns, sterile gloves, sterile drape, hand hygiene and skin antiseptic. A timeout was performed prior to the initiation of the procedure. Ultrasound survey was performed with images stored and sent to PACs. Right IJ vein documented to be patent. The right neck and chest was prepped with chlorhexidine, and draped in the usual sterile fashion using maximum barrier technique (cap and mask, sterile gown, sterile gloves, large sterile sheet, hand hygiene and cutaneous antiseptic). Local anesthesia was attained by infiltration with 1% lidocaine without epinephrine. Ultrasound demonstrated patency of the right internal jugular vein, and this was documented with an image. Under real-time ultrasound guidance, this vein was accessed with a 21 gauge micropuncture needle and image documentation was performed. A small dermatotomy was made at the access site with an 11 scalpel. A 0.018" wire was advanced into the SVC and used to estimate the length of the internal catheter. The access needle exchanged for a 4F micropuncture vascular sheath. The 0.018" wire was then removed and a 0.035" wire advanced into the IVC. An appropriate location for the subcutaneous reservoir was selected below the clavicle and an incision was made through the skin and underlying soft tissues. The subcutaneous tissues were then dissected using a combination of blunt and sharp surgical technique and a pocket was formed. A single lumen power injectable portacatheter was then tunneled through the subcutaneous tissues from the pocket to the dermatotomy and the port reservoir placed within the subcutaneous pocket. The venous access site was then  serially dilated and a peel away vascular sheath placed over the wire. The wire was removed and the port catheter advanced into position under fluoroscopic guidance. The catheter tip is positioned in the cavoatrial junction. This was documented with a spot image. The portacatheter was then tested and found to flush and aspirate well. The pocket was then closed in two layers using first subdermal inverted interrupted absorbable sutures followed by a running subcuticular suture. The epidermis was then sealed with Dermabond. Steri-Strips were placed. The dermatotomy at the venous access site was also seal with Dermabond. After completion, the [facility] needle was used to access the port and additional flush was performed to confirm positioning and function. The patient has immediate appointment for use. Patient tolerated the procedure well and remained hemodynamically stable throughout. No complications encountered and no significant blood loss encountered IMPRESSION: Status post right IJ port catheter placement. Signed, Dulcy Fanny. Nadene Rubins, RPVI [facility] Radiology Electronically Signed   By: Corrie Mckusick D.O.   On: 08/11/2022 10:23   MR BRAIN W WO CONTRAST  Result Date: 08/10/2022 CLINICAL DATA:  Non-small-cell lung cancer, staging. EXAM: MRI HEAD WITHOUT  AND WITH CONTRAST TECHNIQUE: Multiplanar, multiecho pulse sequences of the brain and surrounding structures were obtained without and with intravenous contrast. CONTRAST:  65m GADAVIST GADOBUTROL 1 MMOL/ML IV SOLN COMPARISON:  MRI brain 02/08/2020. FINDINGS: Brain: Unchanged encephalomalacia in the right middle frontal gyrus and right parietal lobe, favored to reflect old infarcts. No acute infarct or intracranial hemorrhage. Single microhemorrhage in the left basal ganglia. No mass or abnormal enhancement. No hydrocephalus or extra-axial collection. Vascular: Normal flow voids. Skull and upper cervical  spine: Normal marrow signal and enhancement. Sinuses/Orbits: Unremarkable. Other: None. IMPRESSION: No evidence of metastatic disease in the brain. Electronically Signed   By: WEmmit AlexandersM.D.   On: 08/10/2022 10:46    ASSESSMENT AND PLAN: This is a very pleasant 77years old white male with: 1) Metastatic poorly differentiated carcinoma, neuroendocrine carcinoma of suspicious prostate primary versus primary lung cancer presented with innumerable and bilateral pulmonary nodules in addition to right hilar and mediastinal lymphadenopathy and metastatic disease to several areas of the bone in addition to hypermetabolic activity in the prostate gland diagnosed in January 2024  The patient is currently undergoing systemic chemotherapy with carboplatin for AUC of 5 on day 1, Imfinzi 1500 Mg IV on day 1, etoposide 100 Mg/M2 on days 1, 2 and 3 with Neulasta support every 3 weeks status post 1 cycle. He tolerated the first cycle of his treatment fairly well except for the chemotherapy-induced pancytopenia. I recommended for the patient to proceed with cycle #2 today but I will reduce the dose of carboplatin to AUC of 4 and Doutova side to 80 Mg/M2 starting from cycle #2. I will see him back for follow-up visit in 3 weeks for evaluation with repeat CT scan of the chest, abdomen and pelvis for restaging of his disease. 2) prostate adenocarcinoma diagnosed in February 2024 currently managed by Dr. EJunious SilkThe patient was advised to call immediately if he has any other concerning symptoms in the interval. The patient voices understanding of current disease status and treatment options and is in agreement with the current care plan.  All questions were answered. The patient knows to call the clinic with any problems, questions or concerns. We can certainly see the patient much sooner if necessary.  The total time spent in the appointment was 30 minutes.  Disclaimer: This note was dictated with voice  recognition software. Similar sounding words can inadvertently be transcribed and may not be corrected upon review.

## 2022-08-31 NOTE — Patient Instructions (Signed)
Larry [place]  Discharge Instructions: Thank you for choosing [facility] to provide your oncology and hematology care.   If you have a lab appointment with the [facility], please go directly to the [facility] and check in at the registration area.   Wear comfortable clothing and clothing appropriate for easy access to any Portacath or PICC line.   We strive to give you quality time with your provider. You may need to reschedule your appointment if you arrive late (15 or more minutes).  Arriving late affects you and other patients whose appointments are after yours.  Also, if you miss three or more appointments without notifying the office, you may be dismissed from the clinic at the provider's discretion.      For prescription refill requests, have your pharmacy contact our office and allow 72 hours for refills to be completed.    Today you received the following chemotherapy and/or immunotherapy agents: Durvalumab, Carboplatin, Etoposide      To help prevent nausea and vomiting after your treatment, we encourage you to take your nausea medication as directed.  BELOW ARE SYMPTOMS THAT SHOULD BE REPORTED IMMEDIATELY: *FEVER GREATER THAN 100.4 F (38 C) OR HIGHER *CHILLS OR SWEATING *NAUSEA AND VOMITING THAT IS NOT CONTROLLED WITH YOUR NAUSEA MEDICATION *UNUSUAL SHORTNESS OF BREATH *UNUSUAL BRUISING OR BLEEDING *URINARY PROBLEMS (pain or burning when urinating, or frequent urination) *BOWEL PROBLEMS (unusual diarrhea, constipation, pain near the anus) TENDERNESS IN MOUTH AND THROAT WITH OR WITHOUT PRESENCE OF ULCERS (sore throat, sores in mouth, or a toothache) UNUSUAL RASH, SWELLING OR PAIN  UNUSUAL VAGINAL DISCHARGE OR ITCHING   Items with * indicate a potential emergency and should be followed up as soon as possible or go to the Emergency Department if any problems should occur.  Please show the CHEMOTHERAPY ALERT CARD or  IMMUNOTHERAPY ALERT CARD at check-in to the Emergency Department and triage nurse.  Should you have questions after your visit or need to cancel or reschedule your appointment, please contact [facility]  Dept: [phone]  and follow the prompts.  Office hours are 8:00 a.m. to 4:30 p.m. Monday - Friday. Please note that voicemails left after 4:00 p.m. may not be returned until the following business day.  We are closed weekends and major holidays. You have access to a nurse at all times for urgent questions. Please call the main number to the clinic Dept: [phone] and follow the prompts.   For any non-urgent questions, you may also contact your provider using MyChart. We now offer e-Visits for anyone 83 and older to request care online for non-urgent symptoms. For details visit [email or web address].   Also download the MyChart app! Go to the app store, search "MyChart", open the app, select [name], and log in with your MyChart username and password.

## 2022-08-31 NOTE — Progress Notes (Signed)
Per Dr. Julien Nordmann, [place] to treat with temperature 100.7.  VO received for Tylenol 650 mg from Dr. Julien Nordmann for sinus headache rated 4/10.

## 2022-09-01 ENCOUNTER — Inpatient Hospital Stay: Payer: Medicare Other

## 2022-09-01 VITALS — BP 129/61 | HR 63 | Temp 97.7°F | Resp 18

## 2022-09-01 DIAGNOSIS — C7A1 Malignant poorly differentiated neuroendocrine tumors: Secondary | ICD-10-CM

## 2022-09-01 DIAGNOSIS — Z79899 Other long term (current) drug therapy: Secondary | ICD-10-CM | POA: Diagnosis not present

## 2022-09-01 DIAGNOSIS — Z5111 Encounter for antineoplastic chemotherapy: Secondary | ICD-10-CM | POA: Diagnosis not present

## 2022-09-01 MED ORDER — SODIUM CHLORIDE 0.9 % IV SOLN
10.0000 mg | Freq: Once | INTRAVENOUS | Status: AC
  Administered 2022-09-01: 10 mg via INTRAVENOUS
  Filled 2022-09-01: qty 10

## 2022-09-01 MED ORDER — SODIUM CHLORIDE 0.9 % IV SOLN: Freq: Once | INTRAVENOUS | Status: AC

## 2022-09-01 MED ORDER — SODIUM CHLORIDE 0.9 % IV SOLN
80.0000 mg/m2 | Freq: Once | INTRAVENOUS | Status: AC
  Administered 2022-09-01: 158 mg via INTRAVENOUS
  Filled 2022-09-01: qty 7.9

## 2022-09-01 MED ORDER — HEPARIN SOD (PORK) LOCK FLUSH 100 UNIT/ML IV SOLN
500.0000 [IU] | Freq: Once | INTRAVENOUS | Status: AC | PRN
  Administered 2022-09-01: 500 [IU]

## 2022-09-01 MED ORDER — SODIUM CHLORIDE 0.9% FLUSH
10.0000 mL | INTRAVENOUS | Status: DC | PRN
  Administered 2022-09-01: 10 mL

## 2022-09-01 MED FILL — Dexamethasone Sodium Phosphate Inj 100 MG/10ML: INTRAMUSCULAR | Qty: 1 | Status: AC

## 2022-09-02 ENCOUNTER — Inpatient Hospital Stay: Payer: Medicare Other

## 2022-09-02 ENCOUNTER — Other Ambulatory Visit: Payer: Self-pay

## 2022-09-02 VITALS — BP 122/79 | HR 61 | Temp 97.8°F | Resp 16

## 2022-09-02 DIAGNOSIS — Z79899 Other long term (current) drug therapy: Secondary | ICD-10-CM | POA: Diagnosis not present

## 2022-09-02 DIAGNOSIS — Z5111 Encounter for antineoplastic chemotherapy: Secondary | ICD-10-CM | POA: Diagnosis not present

## 2022-09-02 DIAGNOSIS — C7A1 Malignant poorly differentiated neuroendocrine tumors: Secondary | ICD-10-CM

## 2022-09-02 MED ORDER — SODIUM CHLORIDE 0.9 % IV SOLN
10.0000 mg | Freq: Once | INTRAVENOUS | Status: AC
  Administered 2022-09-02: 10 mg via INTRAVENOUS
  Filled 2022-09-02: qty 10

## 2022-09-02 MED ORDER — SODIUM CHLORIDE 0.9 % IV SOLN
80.0000 mg/m2 | Freq: Once | INTRAVENOUS | Status: AC
  Administered 2022-09-02: 158 mg via INTRAVENOUS
  Filled 2022-09-02: qty 7.9

## 2022-09-02 MED ORDER — SODIUM CHLORIDE 0.9% FLUSH
10.0000 mL | INTRAVENOUS | Status: DC | PRN
  Administered 2022-09-02: 10 mL

## 2022-09-02 MED ORDER — HEPARIN SOD (PORK) LOCK FLUSH 100 UNIT/ML IV SOLN
500.0000 [IU] | Freq: Once | INTRAVENOUS | Status: AC | PRN
  Administered 2022-09-02: 500 [IU]

## 2022-09-02 MED ORDER — SODIUM CHLORIDE 0.9 % IV SOLN: Freq: Once | INTRAVENOUS | Status: AC

## 2022-09-02 NOTE — Patient Instructions (Signed)
[facility]  Discharge Instructions: Thank you for choosing [facility] to provide your oncology and hematology care.   If you have a lab appointment with the Cancer Center, please go directly to the Cancer Center and check in at the registration area.   Wear comfortable clothing and clothing appropriate for easy access to any Portacath or PICC line.   We strive to give you quality time with your provider. You may need to reschedule your appointment if you arrive late (15 or more minutes).  Arriving late affects you and other patients whose appointments are after yours.  Also, if you miss three or more appointments without notifying the office, you may be dismissed from the clinic at the provider's discretion.      For prescription refill requests, have your pharmacy contact our office and allow 72 hours for refills to be completed.    Today you received the following chemotherapy and/or immunotherapy agents; Etoposide      To help prevent nausea and vomiting after your treatment, we encourage you to take your nausea medication as directed.  BELOW ARE SYMPTOMS THAT SHOULD BE REPORTED IMMEDIATELY: *FEVER GREATER THAN 100.4 F (38 C) OR HIGHER *CHILLS OR SWEATING *NAUSEA AND VOMITING THAT IS NOT CONTROLLED WITH YOUR NAUSEA MEDICATION *UNUSUAL SHORTNESS OF BREATH *UNUSUAL BRUISING OR BLEEDING *URINARY PROBLEMS (pain or burning when urinating, or frequent urination) *BOWEL PROBLEMS (unusual diarrhea, constipation, pain near the anus) TENDERNESS IN MOUTH AND THROAT WITH OR WITHOUT PRESENCE OF ULCERS (sore throat, sores in mouth, or a toothache) UNUSUAL RASH, SWELLING OR PAIN  UNUSUAL VAGINAL DISCHARGE OR ITCHING   Items with * indicate a potential emergency and should be followed up as soon as possible or go to the Emergency Department if any problems should occur.  Please show the CHEMOTHERAPY ALERT CARD or IMMUNOTHERAPY ALERT CARD at  check-in to the Emergency Department and triage nurse.  Should you have questions after your visit or need to cancel or reschedule your appointment, please contact [facility]  Dept: [phone]  and follow the prompts.  Office hours are 8:00 a.m. to 4:30 p.m. Monday - Friday. Please note that voicemails left after 4:00 p.m. may not be returned until the following business day.  We are closed weekends and major holidays. You have access to a nurse at all times for urgent questions. Please call the main number to the clinic Dept: [phone] and follow the prompts.   For any non-urgent questions, you may also contact your provider using MyChart. We now offer e-Visits for anyone 18 and older to request care online for non-urgent symptoms. For details visit [email or web address].   Also download the MyChart app! Go to the app store, search "MyChart", open the app, select [facility], and log in with your MyChart username and password.   

## 2022-09-04 ENCOUNTER — Inpatient Hospital Stay: Payer: Medicare Other

## 2022-09-04 VITALS — BP 119/65 | HR 60 | Temp 98.5°F | Resp 16

## 2022-09-04 DIAGNOSIS — T80212D Local infection due to central venous catheter, subsequent encounter: Secondary | ICD-10-CM | POA: Diagnosis not present

## 2022-09-04 DIAGNOSIS — I25110 Atherosclerotic heart disease of native coronary artery with unstable angina pectoris: Secondary | ICD-10-CM | POA: Diagnosis present

## 2022-09-04 DIAGNOSIS — T83511D Infection and inflammatory reaction due to indwelling urethral catheter, subsequent encounter: Secondary | ICD-10-CM | POA: Diagnosis not present

## 2022-09-04 DIAGNOSIS — I2119 ST elevation (STEMI) myocardial infarction involving other coronary artery of inferior wall: Secondary | ICD-10-CM | POA: Diagnosis not present

## 2022-09-04 DIAGNOSIS — D709 Neutropenia, unspecified: Secondary | ICD-10-CM | POA: Diagnosis not present

## 2022-09-04 DIAGNOSIS — D61810 Antineoplastic chemotherapy induced pancytopenia: Secondary | ICD-10-CM | POA: Diagnosis present

## 2022-09-04 DIAGNOSIS — Z955 Presence of coronary angioplasty implant and graft: Secondary | ICD-10-CM

## 2022-09-04 DIAGNOSIS — N390 Urinary tract infection, site not specified: Secondary | ICD-10-CM | POA: Diagnosis not present

## 2022-09-04 DIAGNOSIS — D638 Anemia in other chronic diseases classified elsewhere: Secondary | ICD-10-CM | POA: Diagnosis not present

## 2022-09-04 DIAGNOSIS — I213 ST elevation (STEMI) myocardial infarction of unspecified site: Secondary | ICD-10-CM | POA: Diagnosis not present

## 2022-09-04 DIAGNOSIS — Z8249 Family history of ischemic heart disease and other diseases of the circulatory system: Secondary | ICD-10-CM

## 2022-09-04 DIAGNOSIS — Z85828 Personal history of other malignant neoplasm of skin: Secondary | ICD-10-CM

## 2022-09-04 DIAGNOSIS — I679 Cerebrovascular disease, unspecified: Secondary | ICD-10-CM | POA: Diagnosis present

## 2022-09-04 DIAGNOSIS — Z960 Presence of urogenital implants: Secondary | ICD-10-CM | POA: Diagnosis not present

## 2022-09-04 DIAGNOSIS — Z8042 Family history of malignant neoplasm of prostate: Secondary | ICD-10-CM | POA: Diagnosis not present

## 2022-09-04 DIAGNOSIS — C7B8 Other secondary neuroendocrine tumors: Secondary | ICD-10-CM | POA: Diagnosis present

## 2022-09-04 DIAGNOSIS — I252 Old myocardial infarction: Secondary | ICD-10-CM | POA: Diagnosis not present

## 2022-09-04 DIAGNOSIS — T83518A Infection and inflammatory reaction due to other urinary catheter, initial encounter: Secondary | ICD-10-CM | POA: Diagnosis not present

## 2022-09-04 DIAGNOSIS — I700 Atherosclerosis of aorta: Secondary | ICD-10-CM | POA: Diagnosis present

## 2022-09-04 DIAGNOSIS — E785 Hyperlipidemia, unspecified: Secondary | ICD-10-CM | POA: Diagnosis present

## 2022-09-04 DIAGNOSIS — A4181 Sepsis due to Enterococcus: Secondary | ICD-10-CM | POA: Diagnosis not present

## 2022-09-04 DIAGNOSIS — N401 Enlarged prostate with lower urinary tract symptoms: Secondary | ICD-10-CM | POA: Diagnosis present

## 2022-09-04 DIAGNOSIS — R7881 Bacteremia: Secondary | ICD-10-CM | POA: Diagnosis not present

## 2022-09-04 DIAGNOSIS — E119 Type 2 diabetes mellitus without complications: Secondary | ICD-10-CM | POA: Diagnosis present

## 2022-09-04 DIAGNOSIS — C61 Malignant neoplasm of prostate: Secondary | ICD-10-CM | POA: Diagnosis present

## 2022-09-04 DIAGNOSIS — Z800 Family history of malignant neoplasm of digestive organs: Secondary | ICD-10-CM | POA: Diagnosis not present

## 2022-09-04 DIAGNOSIS — E861 Hypovolemia: Secondary | ICD-10-CM | POA: Diagnosis not present

## 2022-09-04 DIAGNOSIS — R7303 Prediabetes: Secondary | ICD-10-CM | POA: Diagnosis not present

## 2022-09-04 DIAGNOSIS — T80212A Local infection due to central venous catheter, initial encounter: Secondary | ICD-10-CM | POA: Diagnosis not present

## 2022-09-04 DIAGNOSIS — E871 Hypo-osmolality and hyponatremia: Secondary | ICD-10-CM | POA: Diagnosis not present

## 2022-09-04 DIAGNOSIS — C779 Secondary and unspecified malignant neoplasm of lymph node, unspecified: Secondary | ICD-10-CM | POA: Diagnosis not present

## 2022-09-04 DIAGNOSIS — Z803 Family history of malignant neoplasm of breast: Secondary | ICD-10-CM | POA: Diagnosis not present

## 2022-09-04 DIAGNOSIS — R310 Gross hematuria: Secondary | ICD-10-CM | POA: Diagnosis present

## 2022-09-04 DIAGNOSIS — C7A1 Malignant poorly differentiated neuroendocrine tumors: Secondary | ICD-10-CM

## 2022-09-04 DIAGNOSIS — R338 Other retention of urine: Secondary | ICD-10-CM | POA: Diagnosis not present

## 2022-09-04 DIAGNOSIS — R54 Age-related physical debility: Secondary | ICD-10-CM | POA: Diagnosis present

## 2022-09-04 DIAGNOSIS — T451X5A Adverse effect of antineoplastic and immunosuppressive drugs, initial encounter: Secondary | ICD-10-CM | POA: Diagnosis present

## 2022-09-04 DIAGNOSIS — R509 Fever, unspecified: Secondary | ICD-10-CM | POA: Diagnosis not present

## 2022-09-04 DIAGNOSIS — Z823 Family history of stroke: Secondary | ICD-10-CM

## 2022-09-04 DIAGNOSIS — R000 Tachycardia, unspecified: Secondary | ICD-10-CM | POA: Diagnosis not present

## 2022-09-04 DIAGNOSIS — I2510 Atherosclerotic heart disease of native coronary artery without angina pectoris: Secondary | ICD-10-CM | POA: Diagnosis not present

## 2022-09-04 DIAGNOSIS — Z452 Encounter for adjustment and management of vascular access device: Secondary | ICD-10-CM | POA: Diagnosis not present

## 2022-09-04 DIAGNOSIS — Y846 Urinary catheterization as the cause of abnormal reaction of the patient, or of later complication, without mention of misadventure at the time of the procedure: Secondary | ICD-10-CM | POA: Diagnosis not present

## 2022-09-04 DIAGNOSIS — B952 Enterococcus as the cause of diseases classified elsewhere: Secondary | ICD-10-CM | POA: Diagnosis not present

## 2022-09-04 DIAGNOSIS — R0789 Other chest pain: Secondary | ICD-10-CM | POA: Diagnosis not present

## 2022-09-04 DIAGNOSIS — D3A8 Other benign neuroendocrine tumors: Secondary | ICD-10-CM | POA: Diagnosis not present

## 2022-09-04 DIAGNOSIS — Z7982 Long term (current) use of aspirin: Secondary | ICD-10-CM

## 2022-09-04 DIAGNOSIS — I2111 ST elevation (STEMI) myocardial infarction involving right coronary artery: Secondary | ICD-10-CM | POA: Diagnosis not present

## 2022-09-04 DIAGNOSIS — Z79899 Other long term (current) drug therapy: Secondary | ICD-10-CM

## 2022-09-04 DIAGNOSIS — R079 Chest pain, unspecified: Secondary | ICD-10-CM | POA: Diagnosis not present

## 2022-09-04 DIAGNOSIS — R319 Hematuria, unspecified: Secondary | ICD-10-CM | POA: Diagnosis not present

## 2022-09-04 MED ORDER — PEGFILGRASTIM-CBQV 6 MG/0.6ML ~~LOC~~ SOSY
6.0000 mg | PREFILLED_SYRINGE | Freq: Once | SUBCUTANEOUS | Status: AC
  Administered 2022-09-04: 6 mg via SUBCUTANEOUS
  Filled 2022-09-04: qty 0.6

## 2022-09-07 ENCOUNTER — Emergency Department (HOSPITAL_COMMUNITY): Payer: Medicare Other

## 2022-09-07 ENCOUNTER — Inpatient Hospital Stay (HOSPITAL_COMMUNITY)
Admission: EM | Disposition: A | Discharge status: Home / Self Care | Payer: Self-pay | Source: Home / Self Care | Attending: Internal Medicine

## 2022-09-07 ENCOUNTER — Other Ambulatory Visit: Payer: Self-pay

## 2022-09-07 ENCOUNTER — Inpatient Hospital Stay (HOSPITAL_COMMUNITY)
Admission: EM | Admit: 2022-09-07 | Discharge: 2022-09-15 | DRG: 321 | Disposition: A | Discharge status: Home / Self Care | Payer: Medicare Other | Attending: Internal Medicine | Admitting: Internal Medicine

## 2022-09-07 ENCOUNTER — Inpatient Hospital Stay: Payer: Medicare Other

## 2022-09-07 VITALS — BP 149/69 | HR 62 | Resp 20

## 2022-09-07 DIAGNOSIS — I213 ST elevation (STEMI) myocardial infarction of unspecified site: Secondary | ICD-10-CM | POA: Diagnosis not present

## 2022-09-07 DIAGNOSIS — E119 Type 2 diabetes mellitus without complications: Secondary | ICD-10-CM | POA: Diagnosis not present

## 2022-09-07 DIAGNOSIS — A4181 Sepsis due to Enterococcus: Secondary | ICD-10-CM | POA: Diagnosis not present

## 2022-09-07 DIAGNOSIS — C7A1 Malignant poorly differentiated neuroendocrine tumors: Secondary | ICD-10-CM

## 2022-09-07 DIAGNOSIS — I2111 ST elevation (STEMI) myocardial infarction involving right coronary artery: Secondary | ICD-10-CM | POA: Diagnosis not present

## 2022-09-07 DIAGNOSIS — N401 Enlarged prostate with lower urinary tract symptoms: Secondary | ICD-10-CM | POA: Diagnosis present

## 2022-09-07 DIAGNOSIS — R7303 Prediabetes: Secondary | ICD-10-CM | POA: Diagnosis present

## 2022-09-07 DIAGNOSIS — T80212A Local infection due to central venous catheter, initial encounter: Secondary | ICD-10-CM

## 2022-09-07 DIAGNOSIS — I252 Old myocardial infarction: Secondary | ICD-10-CM | POA: Diagnosis not present

## 2022-09-07 DIAGNOSIS — Z955 Presence of coronary angioplasty implant and graft: Secondary | ICD-10-CM

## 2022-09-07 DIAGNOSIS — Z79899 Other long term (current) drug therapy: Secondary | ICD-10-CM | POA: Diagnosis not present

## 2022-09-07 DIAGNOSIS — R319 Hematuria, unspecified: Secondary | ICD-10-CM | POA: Diagnosis present

## 2022-09-07 DIAGNOSIS — Z960 Presence of urogenital implants: Secondary | ICD-10-CM | POA: Diagnosis not present

## 2022-09-07 DIAGNOSIS — I2119 ST elevation (STEMI) myocardial infarction involving other coronary artery of inferior wall: Secondary | ICD-10-CM | POA: Diagnosis not present

## 2022-09-07 DIAGNOSIS — I2129 ST elevation (STEMI) myocardial infarction involving other sites: Secondary | ICD-10-CM

## 2022-09-07 DIAGNOSIS — Z8042 Family history of malignant neoplasm of prostate: Secondary | ICD-10-CM | POA: Diagnosis not present

## 2022-09-07 DIAGNOSIS — E861 Hypovolemia: Secondary | ICD-10-CM | POA: Diagnosis not present

## 2022-09-07 DIAGNOSIS — Z95828 Presence of other vascular implants and grafts: Secondary | ICD-10-CM

## 2022-09-07 DIAGNOSIS — Z800 Family history of malignant neoplasm of digestive organs: Secondary | ICD-10-CM | POA: Diagnosis not present

## 2022-09-07 DIAGNOSIS — Z85828 Personal history of other malignant neoplasm of skin: Secondary | ICD-10-CM | POA: Diagnosis not present

## 2022-09-07 DIAGNOSIS — B952 Enterococcus as the cause of diseases classified elsewhere: Secondary | ICD-10-CM | POA: Diagnosis not present

## 2022-09-07 DIAGNOSIS — Y846 Urinary catheterization as the cause of abnormal reaction of the patient, or of later complication, without mention of misadventure at the time of the procedure: Secondary | ICD-10-CM | POA: Diagnosis not present

## 2022-09-07 DIAGNOSIS — T80212D Local infection due to central venous catheter, subsequent encounter: Secondary | ICD-10-CM | POA: Diagnosis not present

## 2022-09-07 DIAGNOSIS — E785 Hyperlipidemia, unspecified: Secondary | ICD-10-CM | POA: Diagnosis not present

## 2022-09-07 DIAGNOSIS — D61810 Antineoplastic chemotherapy induced pancytopenia: Secondary | ICD-10-CM | POA: Diagnosis not present

## 2022-09-07 DIAGNOSIS — R7881 Bacteremia: Secondary | ICD-10-CM | POA: Diagnosis not present

## 2022-09-07 DIAGNOSIS — I25110 Atherosclerotic heart disease of native coronary artery with unstable angina pectoris: Secondary | ICD-10-CM | POA: Diagnosis present

## 2022-09-07 DIAGNOSIS — R509 Fever, unspecified: Secondary | ICD-10-CM | POA: Insufficient documentation

## 2022-09-07 DIAGNOSIS — I2510 Atherosclerotic heart disease of native coronary artery without angina pectoris: Secondary | ICD-10-CM | POA: Diagnosis not present

## 2022-09-07 DIAGNOSIS — C3490 Malignant neoplasm of unspecified part of unspecified bronchus or lung: Secondary | ICD-10-CM

## 2022-09-07 DIAGNOSIS — T83518A Infection and inflammatory reaction due to other urinary catheter, initial encounter: Secondary | ICD-10-CM | POA: Diagnosis not present

## 2022-09-07 DIAGNOSIS — E871 Hypo-osmolality and hyponatremia: Secondary | ICD-10-CM | POA: Diagnosis not present

## 2022-09-07 DIAGNOSIS — Z803 Family history of malignant neoplasm of breast: Secondary | ICD-10-CM | POA: Diagnosis not present

## 2022-09-07 DIAGNOSIS — I249 Acute ischemic heart disease, unspecified: Secondary | ICD-10-CM

## 2022-09-07 DIAGNOSIS — C779 Secondary and unspecified malignant neoplasm of lymph node, unspecified: Secondary | ICD-10-CM | POA: Diagnosis not present

## 2022-09-07 DIAGNOSIS — C7B8 Other secondary neuroendocrine tumors: Secondary | ICD-10-CM | POA: Diagnosis not present

## 2022-09-07 DIAGNOSIS — I679 Cerebrovascular disease, unspecified: Secondary | ICD-10-CM | POA: Diagnosis present

## 2022-09-07 DIAGNOSIS — Z823 Family history of stroke: Secondary | ICD-10-CM | POA: Diagnosis not present

## 2022-09-07 DIAGNOSIS — N390 Urinary tract infection, site not specified: Secondary | ICD-10-CM

## 2022-09-07 DIAGNOSIS — D709 Neutropenia, unspecified: Secondary | ICD-10-CM

## 2022-09-07 DIAGNOSIS — D638 Anemia in other chronic diseases classified elsewhere: Secondary | ICD-10-CM | POA: Diagnosis not present

## 2022-09-07 DIAGNOSIS — R338 Other retention of urine: Secondary | ICD-10-CM | POA: Diagnosis not present

## 2022-09-07 DIAGNOSIS — I700 Atherosclerosis of aorta: Secondary | ICD-10-CM | POA: Diagnosis not present

## 2022-09-07 DIAGNOSIS — I219 Acute myocardial infarction, unspecified: Secondary | ICD-10-CM

## 2022-09-07 DIAGNOSIS — T83511D Infection and inflammatory reaction due to indwelling urethral catheter, subsequent encounter: Secondary | ICD-10-CM | POA: Diagnosis not present

## 2022-09-07 DIAGNOSIS — C61 Malignant neoplasm of prostate: Secondary | ICD-10-CM | POA: Diagnosis not present

## 2022-09-07 DIAGNOSIS — R5081 Fever presenting with conditions classified elsewhere: Secondary | ICD-10-CM

## 2022-09-07 HISTORY — PX: CORONARY/GRAFT ACUTE MI REVASCULARIZATION: CATH118305

## 2022-09-07 HISTORY — DX: Acute myocardial infarction, unspecified: I21.9

## 2022-09-07 HISTORY — PX: LEFT HEART CATH AND CORONARY ANGIOGRAPHY: CATH118249

## 2022-09-07 LAB — TROPONIN I (HIGH SENSITIVITY)
Troponin I (High Sensitivity): 3532 ng/L (ref <18)
Troponin I (High Sensitivity): 7318 ng/L (ref <18)

## 2022-09-07 LAB — CBC WITH DIFFERENTIAL (CANCER CENTER ONLY)
Abs Immature Granulocytes: 1.04 10*3/uL — ABNORMAL HIGH (ref 0.00–0.07)
Basophils Absolute: 0.1 10*3/uL (ref 0.0–0.1)
Basophils Relative: 1 %
Eosinophils Absolute: 0 10*3/uL (ref 0.0–0.5)
Eosinophils Relative: 0 %
HCT: 27.9 % — ABNORMAL LOW (ref 39.0–52.0)
Hemoglobin: 10 g/dL — ABNORMAL LOW (ref 13.0–17.0)
Immature Granulocytes: 8 %
Lymphocytes Relative: 5 %
Lymphs Abs: 0.6 10*3/uL — ABNORMAL LOW (ref 0.7–4.0)
MCH: 35.5 pg — ABNORMAL HIGH (ref 26.0–34.0)
MCHC: 35.8 g/dL (ref 30.0–36.0)
MCV: 98.9 fL (ref 80.0–100.0)
Monocytes Absolute: 0.2 10*3/uL (ref 0.1–1.0)
Monocytes Relative: 1 %
Neutro Abs: 11 10*3/uL — ABNORMAL HIGH (ref 1.7–7.7)
Neutrophils Relative %: 85 %
Platelet Count: 302 10*3/uL (ref 150–400)
RBC: 2.82 MIL/uL — ABNORMAL LOW (ref 4.22–5.81)
RDW: 13.2 % (ref 11.5–15.5)
WBC Count: 12.9 10*3/uL — ABNORMAL HIGH (ref 4.0–10.5)
nRBC: 0 % (ref 0.0–0.2)

## 2022-09-07 LAB — LACTIC ACID, PLASMA
Lactic Acid, Venous: 1.9 mmol/L (ref 0.5–1.9)
Lactic Acid, Venous: 1.6 mmol/L (ref 0.5–1.9)

## 2022-09-07 LAB — CMP (CANCER CENTER ONLY)
ALT: 54 U/L — ABNORMAL HIGH (ref 0–44)
AST: 39 U/L (ref 15–41)
Albumin: 3.7 g/dL (ref 3.5–5.0)
Alkaline Phosphatase: 160 U/L — ABNORMAL HIGH (ref 38–126)
Anion gap: 8 (ref 5–15)
BUN: 24 mg/dL — ABNORMAL HIGH (ref 8–23)
CO2: 24 mmol/L (ref 22–32)
Calcium: 9.1 mg/dL (ref 8.9–10.3)
Chloride: 104 mmol/L (ref 98–111)
Creatinine: 0.88 mg/dL (ref 0.61–1.24)
GFR, Estimated: >60 mL/min (ref >60)
Glucose, Bld: 160 mg/dL — ABNORMAL HIGH (ref 70–99)
Potassium: 4.3 mmol/L (ref 3.5–5.1)
Sodium: 136 mmol/L (ref 135–145)
Total Bilirubin: 0.6 mg/dL (ref 0.3–1.2)
Total Protein: 6.8 g/dL (ref 6.5–8.1)

## 2022-09-07 LAB — BASIC METABOLIC PANEL
Anion gap: 9 (ref 5–15)
BUN: 19 mg/dL (ref 8–23)
CO2: 24 mmol/L (ref 22–32)
Calcium: 9 mg/dL (ref 8.9–10.3)
Chloride: 103 mmol/L (ref 98–111)
Creatinine, Ser: 0.94 mg/dL (ref 0.61–1.24)
GFR, Estimated: >60 mL/min (ref >60)
Glucose, Bld: 159 mg/dL — ABNORMAL HIGH (ref 70–99)
Potassium: 4.4 mmol/L (ref 3.5–5.1)
Sodium: 136 mmol/L (ref 135–145)

## 2022-09-07 LAB — SAMPLE TO BLOOD BANK

## 2022-09-07 LAB — APTT: aPTT: >200 seconds (ref 24–36)

## 2022-09-07 LAB — CBC
HCT: 30.5 % — ABNORMAL LOW (ref 39.0–52.0)
Hemoglobin: 10.2 g/dL — ABNORMAL LOW (ref 13.0–17.0)
MCH: 34 pg (ref 26.0–34.0)
MCHC: 33.4 g/dL (ref 30.0–36.0)
MCV: 101.7 fL — ABNORMAL HIGH (ref 80.0–100.0)
Platelets: 304 10*3/uL (ref 150–400)
RBC: 3 MIL/uL — ABNORMAL LOW (ref 4.22–5.81)
RDW: 13.3 % (ref 11.5–15.5)
WBC: 13 10*3/uL — ABNORMAL HIGH (ref 4.0–10.5)
nRBC: 0 % (ref 0.0–0.2)

## 2022-09-07 LAB — POCT I-STAT, CHEM 8
BUN: 20 mg/dL (ref 8–23)
Calcium, Ion: 1.2 mmol/L (ref 1.15–1.40)
Chloride: 104 mmol/L (ref 98–111)
Creatinine, Ser: 0.7 mg/dL (ref 0.61–1.24)
Glucose, Bld: 150 mg/dL — ABNORMAL HIGH (ref 70–99)
HCT: 27 % — ABNORMAL LOW (ref 39.0–52.0)
Hemoglobin: 9.2 g/dL — ABNORMAL LOW (ref 13.0–17.0)
Potassium: 4 mmol/L (ref 3.5–5.1)
Sodium: 136 mmol/L (ref 135–145)
TCO2: 21 mmol/L — ABNORMAL LOW (ref 22–32)

## 2022-09-07 LAB — POCT ACTIVATED CLOTTING TIME
Activated Clotting Time: 212 seconds
Activated Clotting Time: 228 seconds
Activated Clotting Time: 255 seconds
Activated Clotting Time: 260 seconds
Activated Clotting Time: 282 seconds

## 2022-09-07 LAB — PROTIME-INR
INR: 1.2 (ref 0.8–1.2)
Prothrombin Time: 14.7 seconds (ref 11.4–15.2)

## 2022-09-07 LAB — LIPID PANEL
Cholesterol: 137 mg/dL (ref 0–200)
HDL: 62 mg/dL (ref >40)
LDL Cholesterol: 64 mg/dL (ref 0–99)
Total CHOL/HDL Ratio: 2.2 RATIO
Triglycerides: 55 mg/dL (ref <150)
VLDL: 11 mg/dL (ref 0–40)

## 2022-09-07 SURGERY — LEFT HEART CATH AND CORONARY ANGIOGRAPHY
Anesthesia: LOCAL

## 2022-09-07 MED ORDER — HEPARIN SODIUM (PORCINE) 1000 UNIT/ML IJ SOLN
INTRAMUSCULAR | Status: AC
  Filled 2022-09-07: qty 10

## 2022-09-07 MED ORDER — FENTANYL CITRATE PF 50 MCG/ML IJ SOSY
25.0000 ug | PREFILLED_SYRINGE | INTRAMUSCULAR | Status: DC | PRN
  Administered 2022-09-07 (×2): 25 ug via INTRAVENOUS
  Filled 2022-09-07 (×2): qty 1

## 2022-09-07 MED ORDER — HEPARIN (PORCINE) IN NACL 1000-0.9 UT/500ML-% IV SOLN
INTRAVENOUS | Status: DC | PRN
  Administered 2022-09-07 (×2): 500 mL

## 2022-09-07 MED ORDER — SODIUM CHLORIDE 0.9% FLUSH
3.0000 mL | Freq: Two times a day (BID) | INTRAVENOUS | Status: DC
  Administered 2022-09-07 – 2022-09-15 (×11): 3 mL via INTRAVENOUS

## 2022-09-07 MED ORDER — HEPARIN SOD (PORK) LOCK FLUSH 100 UNIT/ML IV SOLN
500.0000 [IU] | Freq: Once | INTRAVENOUS | Status: AC
  Administered 2022-09-07: 500 [IU]

## 2022-09-07 MED ORDER — LIDOCAINE HCL (PF) 1 % IJ SOLN
INTRAMUSCULAR | Status: DC | PRN
  Administered 2022-09-07: 5 mL via INTRADERMAL
  Administered 2022-09-07: 2 mL via INTRADERMAL

## 2022-09-07 MED ORDER — OXIDIZED CELLULOSE EX PADS
1.0000 | MEDICATED_PAD | Freq: Once | CUTANEOUS | Status: DC
  Filled 2022-09-07: qty 1

## 2022-09-07 MED ORDER — MELATONIN 3 MG PO TABS
3.0000 mg | ORAL_TABLET | Freq: Every day | ORAL | Status: DC
  Administered 2022-09-07 – 2022-09-14 (×7): 3 mg via ORAL
  Filled 2022-09-07 (×8): qty 1

## 2022-09-07 MED ORDER — NITROGLYCERIN 1 MG/10 ML FOR IR/CATH LAB
INTRA_ARTERIAL | Status: AC
  Filled 2022-09-07: qty 10

## 2022-09-07 MED ORDER — ACETAMINOPHEN 325 MG PO TABS
650.0000 mg | ORAL_TABLET | ORAL | Status: DC | PRN
  Administered 2022-09-07 – 2022-09-14 (×10): 650 mg via ORAL
  Filled 2022-09-07 (×10): qty 2

## 2022-09-07 MED ORDER — ASPIRIN 81 MG PO CHEW
81.0000 mg | CHEWABLE_TABLET | Freq: Every day | ORAL | Status: DC
  Administered 2022-09-08 – 2022-09-15 (×8): 81 mg via ORAL
  Filled 2022-09-07 (×8): qty 1

## 2022-09-07 MED ORDER — ASPIRIN 81 MG PO CHEW
324.0000 mg | CHEWABLE_TABLET | Freq: Once | ORAL | Status: AC
  Administered 2022-09-07: 324 mg via ORAL
  Filled 2022-09-07: qty 4

## 2022-09-07 MED ORDER — SODIUM CHLORIDE 0.9 % IV SOLN: INTRAVENOUS | Status: DC

## 2022-09-07 MED ORDER — SODIUM CHLORIDE 0.9 % IV SOLN: 250.0000 mL | INTRAVENOUS | Status: DC | PRN

## 2022-09-07 MED ORDER — HEPARIN SODIUM (PORCINE) 5000 UNIT/ML IJ SOLN
60.0000 [IU]/kg | Freq: Once | INTRAMUSCULAR | Status: AC
  Administered 2022-09-07: 5000 [IU] via INTRAVENOUS
  Filled 2022-09-07: qty 1

## 2022-09-07 MED ORDER — HYDRALAZINE HCL 20 MG/ML IJ SOLN: 10.0000 mg | INTRAMUSCULAR | Status: AC | PRN

## 2022-09-07 MED ORDER — HEPARIN SODIUM (PORCINE) 1000 UNIT/ML IJ SOLN
INTRAMUSCULAR | Status: DC | PRN
  Administered 2022-09-07: 2000 [IU] via INTRAVENOUS
  Administered 2022-09-07 (×2): 3000 [IU] via INTRAVENOUS
  Administered 2022-09-07: 5000 [IU] via INTRAVENOUS

## 2022-09-07 MED ORDER — MIDAZOLAM HCL 2 MG/2ML IJ SOLN
INTRAMUSCULAR | Status: DC | PRN
  Administered 2022-09-07 (×2): 1 mg via INTRAVENOUS

## 2022-09-07 MED ORDER — TICAGRELOR 90 MG PO TABS
ORAL_TABLET | ORAL | Status: DC | PRN
  Administered 2022-09-07: 180 mg via ORAL

## 2022-09-07 MED ORDER — LIDOCAINE HCL (PF) 1 % IJ SOLN
INTRAMUSCULAR | Status: AC
  Filled 2022-09-07: qty 30

## 2022-09-07 MED ORDER — IOHEXOL 350 MG/ML SOLN
INTRAVENOUS | Status: DC | PRN
  Administered 2022-09-07: 165 mL

## 2022-09-07 MED ORDER — ONDANSETRON HCL 4 MG/2ML IJ SOLN: 4.0000 mg | Freq: Four times a day (QID) | INTRAMUSCULAR | Status: DC | PRN

## 2022-09-07 MED ORDER — FENTANYL CITRATE (PF) 100 MCG/2ML IJ SOLN
INTRAMUSCULAR | Status: DC | PRN
  Administered 2022-09-07 (×3): 25 ug via INTRAVENOUS

## 2022-09-07 MED ORDER — VERAPAMIL HCL 2.5 MG/ML IV SOLN
INTRAVENOUS | Status: DC | PRN
  Administered 2022-09-07: 10 mL via INTRA_ARTERIAL

## 2022-09-07 MED ORDER — VERAPAMIL HCL 2.5 MG/ML IV SOLN
INTRAVENOUS | Status: AC
  Filled 2022-09-07: qty 2

## 2022-09-07 MED ORDER — ATORVASTATIN CALCIUM 80 MG PO TABS
80.0000 mg | ORAL_TABLET | Freq: Every day | ORAL | Status: DC
  Administered 2022-09-07 – 2022-09-09 (×3): 80 mg via ORAL
  Filled 2022-09-07 (×3): qty 1

## 2022-09-07 MED ORDER — SODIUM CHLORIDE 0.9 % IV SOLN: INTRAVENOUS | Status: AC

## 2022-09-07 MED ORDER — "THROMBI-PAD 3""X3"" EX PADS"
1.0000 | MEDICATED_PAD | Freq: Once | CUTANEOUS | Status: DC
  Filled 2022-09-07: qty 1

## 2022-09-07 MED ORDER — TICAGRELOR 90 MG PO TABS
ORAL_TABLET | ORAL | Status: AC
  Filled 2022-09-07: qty 2

## 2022-09-07 MED ORDER — SODIUM CHLORIDE 0.9% FLUSH: 3.0000 mL | INTRAVENOUS | Status: DC | PRN

## 2022-09-07 MED ORDER — SODIUM CHLORIDE 0.9% FLUSH
10.0000 mL | Freq: Once | INTRAVENOUS | Status: AC
  Administered 2022-09-07: 10 mL

## 2022-09-07 MED ORDER — LABETALOL HCL 5 MG/ML IV SOLN: 10.0000 mg | INTRAVENOUS | Status: AC | PRN

## 2022-09-07 MED ORDER — PANTOPRAZOLE SODIUM 40 MG IV SOLR: 40.0000 mg | INTRAVENOUS | Status: DC

## 2022-09-07 MED ORDER — FENTANYL CITRATE (PF) 100 MCG/2ML IJ SOLN
INTRAMUSCULAR | Status: AC
  Filled 2022-09-07: qty 2

## 2022-09-07 MED ORDER — MIDAZOLAM HCL 2 MG/2ML IJ SOLN
INTRAMUSCULAR | Status: AC
  Filled 2022-09-07: qty 2

## 2022-09-07 MED ORDER — TICAGRELOR 90 MG PO TABS
90.0000 mg | ORAL_TABLET | Freq: Two times a day (BID) | ORAL | Status: DC
  Administered 2022-09-07 – 2022-09-15 (×16): 90 mg via ORAL
  Filled 2022-09-07 (×16): qty 1

## 2022-09-07 MED ORDER — DIPHENHYDRAMINE HCL 25 MG PO CAPS: 25.0000 mg | ORAL_CAPSULE | Freq: Once | ORAL | Status: DC | PRN

## 2022-09-07 SURGICAL SUPPLY — 34 items
BALL SAPPHIRE NC24 3.0X12 (BALLOONS) ×1
BALLN EMERGE MR 2.5X15 (BALLOONS) ×1
BALLN EMERGE MR 3.0X12 (BALLOONS) ×1
BALLN ~~LOC~~ EMERGE MR 3.0X12 (BALLOONS) ×1
BALLOON EMERGE MR 2.5X15 (BALLOONS) IMPLANT
BALLOON EMERGE MR 3.0X12 (BALLOONS) IMPLANT
BALLOON SAPPHIRE NC24 3.0X12 (BALLOONS) IMPLANT
BALLOON ~~LOC~~ EMERGE MR 3.0X12 (BALLOONS) IMPLANT
CATH DIAG 6FR JR4 (CATHETERS) IMPLANT
CATH DIAG 6FR PIGTAIL ANGLED (CATHETERS) IMPLANT
CATH INFINITI 6F FL3.5 (CATHETERS) IMPLANT
CATH TELESCOPE 6F GEC (CATHETERS) IMPLANT
CATH VISTA GUIDE 6FR JR4 (CATHETERS) IMPLANT
CLOSURE PERCLOSE PROSTYLE (VASCULAR PRODUCTS) IMPLANT
DEVICE RAD COMP TR BAND LRG (VASCULAR PRODUCTS) IMPLANT
ELECT DEFIB PAD ADLT CADENCE (PAD) IMPLANT
GLIDESHEATH SLEND SS 6F .021 (SHEATH) IMPLANT
GUIDEWIRE VAS SION BLUE 190 (WIRE) IMPLANT
KIT ENCORE 26 ADVANTAGE (KITS) IMPLANT
KIT HEART LEFT (KITS) ×1 IMPLANT
PACK CARDIAC CATHETERIZATION (CUSTOM PROCEDURE TRAY) ×1 IMPLANT
SHEATH PINNACLE 6F 10CM (SHEATH) IMPLANT
STENT SYNERGY XD 3.0X12 (Permanent Stent) IMPLANT
STENT SYNERGY XD 3.0X16 (Permanent Stent) IMPLANT
STENT SYNERGY XD 3.0X24 (Permanent Stent) IMPLANT
SYNERGY XD 3.0X12 (Permanent Stent) ×2 IMPLANT
SYNERGY XD 3.0X16 (Permanent Stent) ×1 IMPLANT
SYNERGY XD 3.0X24 (Permanent Stent) ×1 IMPLANT
TRANSDUCER W/STOPCOCK (MISCELLANEOUS) ×1 IMPLANT
TUBING CIL FLEX 10 FLL-RA (TUBING) ×1 IMPLANT
WIRE ASAHI PROWATER 180CM (WIRE) IMPLANT
WIRE EMERALD 3MM-J .035X260CM (WIRE) IMPLANT
WIRE HI TORQ VERSACORE-J 145CM (WIRE) IMPLANT
WIRE MICRO SET SILHO 5FR 7 (SHEATH) IMPLANT

## 2022-09-07 NOTE — ED Triage Notes (Signed)
Pt from [facility]. Pt c/o R chest pain w/mild SOB beginning at 1045 this am. Pt had labs drawn and port flushed at [facility], then they sent pt to [place].  Pt has stage 4 prostate and lung cancer.  AOx4

## 2022-09-07 NOTE — H&P (Addendum)
Cardiology Admission History and Physical   Patient ID: Larry Kent MRN: [id number]; DOB: [birth year]   Admission date: 09/07/2022  PCP:  Ria Bush, MD   [facility] Cardiologist:  Kathlyn Sacramento, MD      Chief Complaint:  STEMI  Patient Profile:   Larry Kent is a 77 y.o. male with a past medical history of static prostate cancer and lung nodules with pathology showing neuroendocrine carcinoma, carotid disease s/p carotid endarterectomy in 2021, HLD who is being seen 09/07/2022 for the evaluation of STEMI.  History of Present Illness:   [name] is a 77 year old male with above medical history who is followed by Dr. Cicero Duck.  Per chart review, patient's most recent echocardiogram from 10/22/2020 showed EF 55-60%, moderate LVH, grade 2 diastolic dysfunction, normal RV systolic function mild aortic valve stenosis, mild dilation of the ascending aorta measuring 37 mm.  He has been followed by both Dr. Cicero Duck and Dr. Carlis Abbott with vascular surgery for carotid artery disease.  He had a right carotid endarterectomy in June 2021 for severe, asymptomatic stenosis.  His most recent carotid duplex studies from 09/23/21 showed no evidence of stenosis in the right ICA, 1-39% stenosis in the left ICA.  Patient was diagnosed with neuroendocrine cancer in 06/2022 and is followed at that cancer center. Patient was at the cancer center this morning for port flush with labs.  Patient complained of having chest tightness and shortness of breath without exertion. Chest pain had been intermittent and worsening since about 8AM today. Patient was sent to the [facility] ED for evaluation. EKG in the ED showed sinus rhythm with ST elevation in leads II, 3, aVF consistent with inferior STEMI. He was further transferred to [facility] for cardiac catheterization.       Past Medical History:  Diagnosis Date   Allergy    Arthritis    both knees    BCC (basal cell carcinoma), arm, right 09/2019    MOHS ([place])   BCC (basal cell carcinoma), face 2014   L preauricular s/p MOHS   BCC (basal cell carcinoma), face 02/2018   L upper lip   BPH (benign prostatic hypertrophy)    Dr. Junious Silk @ [facility]   Carotid stenosis [id number]   RICA 123456, LICA 123456, L vertebral occlusion, rpt 1 yr   Cataract    removed both eyes    FHx: colon cancer    FHx: prostate cancer    Heart murmur    mild aortic stenosis    History of kidney stones    Hyperlipidemia    borderline- on rosuvastatin now normal    Occlusion of right vertebral artery 05/2015    Past Surgical History:  Procedure Laterality Date   BASAL CELL CARCINOMA EXCISION  05/2018   lip, 2020 x3  basal cells removed    CATARACT EXTRACTION  2008   Left   CATARACT EXTRACTION  2013   Right ([place])   COLONOSCOPY  ~2010   medium int hemorrhoids, o/w WNL, rpt 5 yrs given fmhx (Dr. Oletta Lamas)   COLONOSCOPY  08/2019   TAs, diverticulosis, rpt 3 yrs ([place])   ENDARTERECTOMY Right 12/01/2019   Procedure: RIGHT ENDARTERECTOMY CAROTID;  Surgeon: [name], MD;  Location: [place];  Service: Vascular;  Laterality: Right;   exercise treadmill  11/2005   WNL [place])   FINE NEEDLE ASPIRATION  07/28/2022   Procedure: FINE NEEDLE ASPIRATION (FNA) LINEAR;  Surgeon: Garner Nash, DO;  Location: [place];  Service: Pulmonary;;   HERNIA REPAIR  2008   Right   IR IMAGING GUIDED PORT INSERTION  08/11/2022   MOHS SURGERY Left spring 2014   basal cell face   PATCH ANGIOPLASTY Right 12/01/2019   Procedure: PATCH ANGIOPLASTY USING Rueben Bash BIOLOGIC PATCH;  Surgeon: [name], MD;  Location: [place];  Service: Vascular;  Laterality: Right;   Testicular Biopsy  2003   benign, varicocele   US ECHOCARDIOGRAPHY  11/2005   aortic sclerosis, EF 0000000, diastolic dysfunction   VIDEO BRONCHOSCOPY WITH ENDOBRONCHIAL ULTRASOUND Bilateral 07/28/2022   Procedure: VIDEO BRONCHOSCOPY WITH ENDOBRONCHIAL ULTRASOUND;  Surgeon: Garner Nash,  DO;  Location: [place];  Service: Pulmonary;  Laterality: Bilateral;     Medications Prior to Admission: Prior to Admission medications   Medication Sig Start Date End Date Taking? Authorizing Provider  acetaminophen (TYLENOL) 500 MG tablet Take 1,000 mg by mouth every 6 (six) hours as needed for mild pain or headache.    [provider]  fluticasone (FLONASE) 50 MCG/ACT nasal spray Place 1 spray into both nostrils daily as needed for allergies or rhinitis.    [provider]  lidocaine-prilocaine (EMLA) cream Apply to the Port-A-Cath site 30-60 minutes before chemotherapy treatment Patient taking differently: Apply 1 Application topically See admin instructions. Apply to the Port-A-Cath site 30-60 minutes before chemotherapy treatment 08/03/22   Curt Bears, MD  Multiple Vitamin (MULTIVITAMIN) tablet Take 1 tablet by mouth daily with breakfast.    [provider]  prochlorperazine (COMPAZINE) 10 MG tablet Take 1 tablet (10 mg total) by mouth every 6 (six) hours as needed for nausea or vomiting. 08/03/22   Curt Bears, MD  rosuvastatin (CRESTOR) 20 MG tablet TAKE 1 TABLET BY MOUTH EVERY DAY Patient taking differently: Take 20 mg by mouth in the morning. 04/28/22   Ria Bush, MD  solifenacin (VESICARE) 5 MG tablet Take 5 mg by mouth daily. 08/19/22   [provider]  SYSTANE ULTRA PF 0.4-0.3 % SOLN Place 1 drop into both eyes 3 (three) times daily as needed (for dryness/irritation).    [provider]  Zinc 25 MG TABS Take 25 mg by mouth daily.    [provider]     Allergies:   No Known Allergies  Social History:   Social History   Socioeconomic History   Marital status: Married    Spouse name: Not on file   Number of children: Not on file   Years of education: Not on file   Highest education level: Not on file  Occupational History   Not on file  Tobacco Use   Smoking status: Never   Smokeless tobacco: Never   Vaping Use   Vaping Use: Never used  Substance and Sexual Activity   Alcohol use: Not Currently    Comment: rare   Drug use: No   Sexual activity: Not on file  Other Topics Concern   Not on file  Social History Narrative   Caffeine: 2-3 cups coffee/day   Lives with wife and adult son, 1 dog   Occupation: Counsellor   Edu: college   Activity: golf, no regular exercise, occasionally walks with wife   Diet: good amt water, daily fruits/vegetables, fish several times a week   Social Determinants of Health   Financial Resource Strain: Low Risk  (08/18/2022)   Overall Financial Resource Strain (CARDIA)    Difficulty of Paying Living Expenses: Not very hard  Food Insecurity: No Food Insecurity (08/25/2022)  Hunger Vital Sign    Worried About Running Out of Food in the Last Year: Never true    Ran Out of Food in the Last Year: Never true  Transportation Needs: No Transportation Needs (08/25/2022)   PRAPARE - Hydrologist (Medical): No    Lack of Transportation (Non-Medical): No  Physical Activity: Inactive (07/14/2019)   Exercise Vital Sign    Days of Exercise per Week: 0 days    Minutes of Exercise per Session: 0 min  Stress: No Stress Concern Present (07/14/2019)   [place]    Feeling of Stress : Not at all  Social Connections: Not on file  Intimate Partner Violence: Not At Risk (08/22/2022)   Humiliation, Afraid, Rape, and Kick questionnaire    Fear of Current or Ex-Partner: No    Emotionally Abused: No    Physically Abused: No    Sexually Abused: No    Family History:   The patient's family history includes Breast cancer in his sister; Cancer (age of onset: 17) in his sister; Cancer (age of onset: 60) in his paternal grandfather; Cancer (age of onset: 53) in his father; Cancer (age of onset: 18) in his mother; Colon cancer in his maternal grandmother and mother; Coronary  artery disease in his maternal uncle; Coronary artery disease (age of onset: 36) in his father; Hypertension in his mother; Prostate cancer in his father and paternal grandfather; Stroke in his mother. There is no history of Diabetes, Colon polyps, Esophageal cancer, Rectal cancer, or Stomach cancer.    ROS:  Please see the history of present illness.  All other ROS reviewed and negative.     Physical Exam/Data:   Vitals:   09/07/22 1215 09/07/22 1216  BP: (!) 147/75   Pulse: 71   Resp: (!) 22   Temp: 97.6 F (36.4 C)   TempSrc: Oral   SpO2: 100%   Weight:  83.5 kg  Height:  '5\' 9"'$  (1.753 m)   No intake or output data in the 24 hours ending 09/07/22 1306    09/07/2022   12:16 PM 08/31/2022   10:56 AM 08/18/2022    3:20 PM  Last 3 Weights  Weight (lbs) 184 lb 181 lb 3.2 oz 184 lb  Weight (kg) 83.462 kg 82.192 kg 83.462 kg     Body mass index is 27.17 kg/m.  Physical Exam per MD    EKG:  The ECG that was done 3/11 was personally reviewed and demonstrates sinus rhythm with ST elevation in leads II, 3, aVF consistent with inferior STEMI  Relevant CV Studies:   Laboratory Data:  High Sensitivity Troponin:  No results for input(s): "TROPONINIHS" in the last 720 hours.    Chemistry Recent Labs  Lab 09/07/22 1111  NA 136  K 4.3  CL 104  CO2 24  GLUCOSE 160*  BUN 24*  CREATININE 0.88  CALCIUM 9.1  GFRNONAA >60  ANIONGAP 8    Recent Labs  Lab 09/07/22 1111  PROT 6.8  ALBUMIN 3.7  AST 39  ALT 54*  ALKPHOS 160*  BILITOT 0.6   Lipids No results for input(s): "CHOL", "TRIG", "HDL", "LABVLDL", "LDLCALC", "CHOLHDL" in the last 168 hours. Hematology Recent Labs  Lab 09/07/22 1111 09/07/22 1225  WBC 12.9* 13.0*  RBC 2.82* 3.00*  HGB 10.0* 10.2*  HCT 27.9* 30.5*  MCV 98.9 101.7*  MCH 35.5* 34.0  MCHC 35.8 33.4  RDW 13.2 13.3  PLT 302 304   Thyroid No results for input(s): "TSH", "FREET4" in the last 168 hours. BNPNo results for input(s): "BNP", "PROBNP" in  the last 168 hours.  DDimer No results for input(s): "DDIMER" in the last 168 hours.   Radiology/Studies:  No results found.   Assessment and Plan:   Inferior STEMI - Patient was seen in the cancer center this morning for scheduled appointment, complained of chest tightness and was sent to the ED.  EKG in the ED showed sinus rhythm with ST elevation in leads II, III, aVF consistent with inferior STEMI -Patient is being taken to the Cath Lab emergently with Dr. Ali Lowe  - Further recommendations pending catheterization   Risk Assessment/Risk Scores:   TIMI Risk Score for ST  Elevation MI:   The patient's TIMI risk score is 4, which indicates a 7.3% risk of all cause mortality at 30 days.        Severity of Illness: The appropriate patient status for this patient is INPATIENT. Inpatient status is judged to be reasonable and necessary in order to provide the required intensity of service to ensure the patient's safety. The patient's presenting symptoms, physical exam findings, and initial radiographic and laboratory data in the context of their chronic comorbidities is felt to place them at high risk for further clinical deterioration. Furthermore, it is not anticipated that the patient will be medically stable for discharge from the hospital within 2 midnights of admission.   * I certify that at the point of admission it is my clinical judgment that the patient will require inpatient hospital care spanning beyond 2 midnights from the point of admission due to high intensity of service, high risk for further deterioration and high frequency of surveillance required.*   For questions or updates, please contact [place] Please consult [email or web address] for contact info under     Signed, Margie Billet, PA-C  09/07/2022 1:06 PM    ATTENDING ATTESTATION:  After conducting a review of all available clinical information with the care team, interviewing the patient, and  performing a physical exam, I agree with the findings and plan described in this note.   GEN: No acute distress.  Looks older than stated age 89:  MMM, no JVD, no scleral icterus Cardiac: RRR, no murmurs, rubs, or gallops.  Respiratory: Clear to auscultation bilaterally. GI: Soft, nontender, non-distended  MS: No edema; No deformity. Neuro:  Nonfocal  Vasc:  +2 radial pulses  The patient is a 78 year old male with relatively newly diagnosed metastatic neuroendocrine cancer on palliative chemotherapy with intermittent thrombocytopenia who presents with an acute inferior ST elevation myocardial infarction.  He developed chest pain sometime yesterday evening which was variable in severity but seemed to increase today.  Given ongoing chest pain with worrisome inferior changes will refer for emergent coronary angiography with possible percutaneous coronary intervention.  Lenna Sciara, MD Pager [phone]

## 2022-09-07 NOTE — ED Provider Notes (Signed)
EMERGENCY DEPARTMENT AT [facility] Provider Note   CSN: [place]:[id number] Arrival date & time: 09/07/22  1206     History  Chief Complaint  Patient presents with   Chest Pain   Shortness of Breath    Larry Kent is a 77 y.o. male.  Patient c/o mid chest pain - symptoms initially onset yesterday, worse w exertion/climbing steps, went to infusion center this AM and mentioned that chest pain had returned (hx metastatic neuroendocrine ca).  Pt denies hx cad. Denies cough or uri symptoms. No abd pain or nv. No extremity pain/swelling.   The history is provided by the patient and medical records.  Chest Pain Associated symptoms: shortness of breath   Associated symptoms: no abdominal pain, no back pain, no fever, no headache, no palpitations and no vomiting   Shortness of Breath Associated symptoms: chest pain   Associated symptoms: no abdominal pain, no fever, no headaches, no neck pain, no rash, no sore throat and no vomiting        Home Medications Prior to Admission medications   Medication Sig Start Date End Date Taking? Authorizing Provider  acetaminophen (TYLENOL) 500 MG tablet Take 1,000 mg by mouth every 6 (six) hours as needed for mild pain or headache.    [provider]  fluticasone (FLONASE) 50 MCG/ACT nasal spray Place 1 spray into both nostrils daily as needed for allergies or rhinitis.    [provider]  lidocaine-prilocaine (EMLA) cream Apply to the Port-A-Cath site 30-60 minutes before chemotherapy treatment Patient taking differently: Apply 1 Application topically See admin instructions. Apply to the Port-A-Cath site 30-60 minutes before chemotherapy treatment 08/03/22   Curt Bears, MD  Multiple Vitamin (MULTIVITAMIN) tablet Take 1 tablet by mouth daily with breakfast.    [provider]  prochlorperazine (COMPAZINE) 10 MG tablet Take 1 tablet (10 mg total) by mouth every 6 (six) hours as needed for nausea or  vomiting. 08/03/22   Curt Bears, MD  rosuvastatin (CRESTOR) 20 MG tablet TAKE 1 TABLET BY MOUTH EVERY DAY Patient taking differently: Take 20 mg by mouth in the morning. 04/28/22   Ria Bush, MD  solifenacin (VESICARE) 5 MG tablet Take 5 mg by mouth daily. 08/19/22   [provider]  SYSTANE ULTRA PF 0.4-0.3 % SOLN Place 1 drop into both eyes 3 (three) times daily as needed (for dryness/irritation).    [provider]  Zinc 25 MG TABS Take 25 mg by mouth daily.    [provider]      Allergies    Patient has no known allergies.    Review of Systems   Review of Systems  Constitutional:  Negative for fever.  HENT:  Negative for sore throat.   Eyes:  Negative for redness.  Respiratory:  Positive for shortness of breath.   Cardiovascular:  Positive for chest pain. Negative for palpitations and leg swelling.  Gastrointestinal:  Negative for abdominal pain and vomiting.  Genitourinary:  Negative for flank pain.  Musculoskeletal:  Negative for back pain and neck pain.  Skin:  Negative for rash.  Neurological:  Negative for headaches.  Hematological:  Does not bruise/bleed easily.  Psychiatric/Behavioral:  Negative for confusion.     Physical Exam Updated Vital Signs BP (!) 147/75 (BP Location: Right Arm)   Pulse 71   Temp 97.6 F (36.4 C) (Oral)   Resp (!) 22   Ht 1.753 m (5\' 9" )   Wt 83.5 kg   SpO2 100%  BMI 27.17 kg/m  Physical Exam Vitals and nursing note reviewed.  Constitutional:      Appearance: Normal appearance. He is well-developed.  HENT:     Head: Atraumatic.     Nose: Nose normal.     Mouth/Throat:     Mouth: Mucous membranes are moist.  Eyes:     General: No scleral icterus.    Conjunctiva/sclera: Conjunctivae normal.     Pupils: Pupils are equal, round, and reactive to light.  Neck:     Trachea: No tracheal deviation.  Cardiovascular:     Rate and Rhythm: Normal rate and regular rhythm.     Pulses: Normal pulses.      Heart sounds: Normal heart sounds. No murmur heard.    No friction rub. No gallop.  Pulmonary:     Effort: Pulmonary effort is normal. No accessory muscle usage or respiratory distress.     Breath sounds: Normal breath sounds.     Comments: Port right chest without sign of infection Abdominal:     General: Bowel sounds are normal. There is no distension.     Palpations: Abdomen is soft.     Tenderness: There is no abdominal tenderness.  Genitourinary:    Comments: No cva tenderness. Musculoskeletal:        General: No swelling or tenderness.     Cervical back: Normal range of motion and neck supple. No rigidity.  Skin:    General: Skin is warm and dry.     Findings: No rash.  Neurological:     Mental Status: He is alert.     Comments: Alert, speech clear.   Psychiatric:        Mood and Affect: Mood normal.     ED Results / Procedures / Treatments   Labs (all labs ordered are listed, but only abnormal results are displayed) Results for orders placed or performed during the hospital encounter of 09/07/22  Urine Culture (for pregnant, neutropenic or urologic patients or patients with an indwelling urinary catheter)   Specimen: Urine, Catheterized  Result Value Ref Range   Specimen Description URINE, CATHETERIZED    Special Requests      Immunocompromised Performed at [facility], [place] [address], [place], [place] [address]    Culture >=100,000 COLONIES/mL ENTEROCOCCUS FAECALIS (A)    Report Status 09/11/2022 FINAL    Organism ID, Bacteria ENTEROCOCCUS FAECALIS (A)       Susceptibility   Enterococcus faecalis - MIC*    AMPICILLIN <=2 SENSITIVE Sensitive     NITROFURANTOIN <=16 SENSITIVE Sensitive     VANCOMYCIN 1 SENSITIVE Sensitive     LINEZOLID 2 SENSITIVE Sensitive     * >=100,000 COLONIES/mL ENTEROCOCCUS FAECALIS  Culture, blood (Routine X 2) w Reflex to ID Panel   Specimen: BLOOD RIGHT ARM  Result Value Ref Range   Specimen Description BLOOD RIGHT ARM     Special Requests      BOTTLES DRAWN AEROBIC AND ANAEROBIC Blood Culture adequate volume   Culture  Setup Time      GRAM POSITIVE COCCI IN CHAINS IN BOTH AEROBIC AND ANAEROBIC BOTTLES Organism ID to follow CRITICAL RESULT CALLED TO, READ BACK BY AND VERIFIED WITH: [email or web address]  09/09/22 [place] Performed at [facility], [place] [address] [place], [place] [address]    Culture ENTEROCOCCUS FAECALIS (A)    Report Status 09/11/2022 FINAL    Organism ID, Bacteria ENTEROCOCCUS FAECALIS       Susceptibility   Enterococcus faecalis -  MIC*    AMPICILLIN <=2 SENSITIVE Sensitive     VANCOMYCIN 1 SENSITIVE Sensitive     GENTAMICIN SYNERGY SENSITIVE Sensitive     LINEZOLID 2 SENSITIVE Sensitive     * ENTEROCOCCUS FAECALIS  Culture, blood (Routine X 2) w Reflex to ID Panel   Specimen: BLOOD RIGHT ARM  Result Value Ref Range   Specimen Description BLOOD RIGHT ARM    Special Requests      BOTTLES DRAWN AEROBIC AND ANAEROBIC Blood Culture adequate volume   Culture  Setup Time      GRAM POSITIVE COCCI IN BOTH AEROBIC AND ANAEROBIC BOTTLES CRITICAL VALUE NOTED.  VALUE IS CONSISTENT WITH PREVIOUSLY REPORTED AND CALLED VALUE.    Culture (A)     ENTEROCOCCUS FAECALIS SUSCEPTIBILITIES PERFORMED ON PREVIOUS CULTURE WITHIN THE LAST 5 DAYS. Performed at [facility], [place] [address], [place], [place] [address]    Report Status 09/11/2022 FINAL   Blood Culture ID Panel (Reflexed)  Result Value Ref Range   Enterococcus faecalis DETECTED (A) NOT DETECTED   Enterococcus Faecium NOT DETECTED NOT DETECTED   Listeria monocytogenes NOT DETECTED NOT DETECTED   Staphylococcus species NOT DETECTED NOT DETECTED   Staphylococcus aureus (BCID) NOT DETECTED NOT DETECTED   Staphylococcus epidermidis NOT DETECTED NOT DETECTED   Staphylococcus lugdunensis NOT DETECTED NOT DETECTED   Streptococcus species NOT DETECTED NOT DETECTED   Streptococcus agalactiae NOT DETECTED NOT DETECTED   Streptococcus  pneumoniae NOT DETECTED NOT DETECTED   Streptococcus pyogenes NOT DETECTED NOT DETECTED   A.calcoaceticus-baumannii NOT DETECTED NOT DETECTED   Bacteroides fragilis NOT DETECTED NOT DETECTED   Enterobacterales NOT DETECTED NOT DETECTED   Enterobacter cloacae complex NOT DETECTED NOT DETECTED   Escherichia coli NOT DETECTED NOT DETECTED   Klebsiella aerogenes NOT DETECTED NOT DETECTED   Klebsiella oxytoca NOT DETECTED NOT DETECTED   Klebsiella pneumoniae NOT DETECTED NOT DETECTED   Proteus species NOT DETECTED NOT DETECTED   Salmonella species NOT DETECTED NOT DETECTED   Serratia marcescens NOT DETECTED NOT DETECTED   Haemophilus influenzae NOT DETECTED NOT DETECTED   Neisseria meningitidis NOT DETECTED NOT DETECTED   Pseudomonas aeruginosa NOT DETECTED NOT DETECTED   Stenotrophomonas maltophilia NOT DETECTED NOT DETECTED   Candida albicans NOT DETECTED NOT DETECTED   Candida auris NOT DETECTED NOT DETECTED   Candida glabrata NOT DETECTED NOT DETECTED   Candida krusei NOT DETECTED NOT DETECTED   Candida parapsilosis NOT DETECTED NOT DETECTED   Candida tropicalis NOT DETECTED NOT DETECTED   Cryptococcus neoformans/gattii NOT DETECTED NOT DETECTED   Vancomycin resistance NOT DETECTED NOT DETECTED  Culture, blood (Routine X 2) w Reflex to ID Panel   Specimen: BLOOD  Result Value Ref Range   Specimen Description BLOOD RIGHT ANTECUBITAL    Special Requests      BOTTLES DRAWN AEROBIC AND ANAEROBIC Blood Culture adequate volume   Culture      NO GROWTH < 24 HOURS Performed at [facility], [address] [place], [place] [address]    Report Status PENDING   Culture, blood (Routine X 2) w Reflex to ID Panel   Specimen: BLOOD  Result Value Ref Range   Specimen Description BLOOD RIGHT ANTECUBITAL    Special Requests      BOTTLES DRAWN AEROBIC AND ANAEROBIC Blood Culture adequate volume   Culture      NO GROWTH < 24 HOURS Performed at [facility], [place] [address]  [place], [place] [address]  Report Status PENDING   Basic metabolic panel  Result Value Ref Range   Sodium 136 135 - 145 mmol/L   Potassium 4.4 3.5 - 5.1 mmol/L   Chloride 103 98 - 111 mmol/L   CO2 24 22 - 32 mmol/L   Glucose, Bld 159 (H) 70 - 99 mg/dL   BUN 19 8 - 23 mg/dL   Creatinine, Ser 0.94 0.61 - 1.24 mg/dL   Calcium 9.0 8.9 - 10.3 mg/dL   GFR, Estimated >60 >60 mL/min   Anion gap 9 5 - 15  CBC  Result Value Ref Range   WBC 13.0 (H) 4.0 - 10.5 K/uL   RBC 3.00 (L) 4.22 - 5.81 MIL/uL   Hemoglobin 10.2 (L) 13.0 - 17.0 g/dL   HCT 30.5 (L) 39.0 - 52.0 %   MCV 101.7 (H) 80.0 - 100.0 fL   MCH 34.0 26.0 - 34.0 pg   MCHC 33.4 30.0 - 36.0 g/dL   RDW 13.3 11.5 - 15.5 %   Platelets 304 150 - 400 K/uL   nRBC 0.0 0.0 - 0.2 %  Hemoglobin A1c  Result Value Ref Range   Hgb A1c MFr Bld 6.8 (H) 4.8 - 5.6 %   Mean Plasma Glucose 148 mg/dL  Protime-INR  Result Value Ref Range   Prothrombin Time 14.7 11.4 - 15.2 seconds   INR 1.2 0.8 - 1.2  APTT  Result Value Ref Range   aPTT >200 (HH) 24 - 36 seconds  Lipid panel  Result Value Ref Range   Cholesterol 137 0 - 200 mg/dL   Triglycerides 55 <150 mg/dL   HDL 62 >40 mg/dL   Total CHOL/HDL Ratio 2.2 RATIO   VLDL 11 0 - 40 mg/dL   LDL Cholesterol 64 0 - 99 mg/dL  Lipoprotein A (LPA)  Result Value Ref Range   Lipoprotein (a) 164.1 (H) <75.0 nmol/L  Lactic acid, plasma  Result Value Ref Range   Lactic Acid, Venous 1.9 0.5 - 1.9 mmol/L  Lactic acid, plasma  Result Value Ref Range   Lactic Acid, Venous 1.6 0.5 - 1.9 mmol/L  Basic metabolic panel  Result Value Ref Range   Sodium 134 (L) 135 - 145 mmol/L   Potassium 4.4 3.5 - 5.1 mmol/L   Chloride 105 98 - 111 mmol/L   CO2 20 (L) 22 - 32 mmol/L   Glucose, Bld 135 (H) 70 - 99 mg/dL   BUN 24 (H) 8 - 23 mg/dL   Creatinine, Ser 0.92 0.61 - 1.24 mg/dL   Calcium 8.4 (L) 8.9 - 10.3 mg/dL   GFR, Estimated >60 >60 mL/min   Anion gap 9 5 - 15  CBC  Result Value Ref Range   WBC 8.1 4.0 -  10.5 K/uL   RBC 2.55 (L) 4.22 - 5.81 MIL/uL   Hemoglobin 8.8 (L) 13.0 - 17.0 g/dL   HCT 25.6 (L) 39.0 - 52.0 %   MCV 100.4 (H) 80.0 - 100.0 fL   MCH 34.5 (H) 26.0 - 34.0 pg   MCHC 34.4 30.0 - 36.0 g/dL   RDW 13.4 11.5 - 15.5 %   Platelets 270 150 - 400 K/uL   nRBC 0.0 0.0 - 0.2 %  Glucose, capillary  Result Value Ref Range   Glucose-Capillary 117 (H) 70 - 99 mg/dL  Glucose, capillary  Result Value Ref Range   Glucose-Capillary 137 (H) 70 - 99 mg/dL  Glucose, capillary  Result Value Ref Range   Glucose-Capillary 158 (H) 70 - 99 mg/dL  Glucose, capillary  Result Value Ref Range   Glucose-Capillary 145 (H) 70 - 99 mg/dL  Glucose, capillary  Result Value Ref Range   Glucose-Capillary 114 (H) 70 - 99 mg/dL  CBC  Result Value Ref Range   WBC 4.3 4.0 - 10.5 K/uL   RBC 2.31 (L) 4.22 - 5.81 MIL/uL   Hemoglobin 8.2 (L) 13.0 - 17.0 g/dL   HCT 22.9 (L) 39.0 - 52.0 %   MCV 99.1 80.0 - 100.0 fL   MCH 35.5 (H) 26.0 - 34.0 pg   MCHC 35.8 30.0 - 36.0 g/dL   RDW 13.7 11.5 - 15.5 %   Platelets 183 150 - 400 K/uL   nRBC 0.0 0.0 - 0.2 %  Basic metabolic panel  Result Value Ref Range   Sodium 129 (L) 135 - 145 mmol/L   Potassium 3.8 3.5 - 5.1 mmol/L   Chloride 99 98 - 111 mmol/L   CO2 17 (L) 22 - 32 mmol/L   Glucose, Bld 113 (H) 70 - 99 mg/dL   BUN 23 8 - 23 mg/dL   Creatinine, Ser 1.00 0.61 - 1.24 mg/dL   Calcium 8.4 (L) 8.9 - 10.3 mg/dL   GFR, Estimated >60 >60 mL/min   Anion gap 13 5 - 15  CBC with Differential/Platelet  Result Value Ref Range   WBC 3.8 (L) 4.0 - 10.5 K/uL   RBC 2.34 (L) 4.22 - 5.81 MIL/uL   Hemoglobin 8.3 (L) 13.0 - 17.0 g/dL   HCT 23.4 (L) 39.0 - 52.0 %   MCV 100.0 80.0 - 100.0 fL   MCH 35.5 (H) 26.0 - 34.0 pg   MCHC 35.5 30.0 - 36.0 g/dL   RDW 13.7 11.5 - 15.5 %   Platelets 182 150 - 400 K/uL   nRBC 0.0 0.0 - 0.2 %   Neutrophils Relative % 83 %   Neutro Abs 3.2 1.7 - 7.7 K/uL   Lymphocytes Relative 10 %   Lymphs Abs 0.4 (L) 0.7 - 4.0 K/uL   Monocytes  Relative 5 %   Monocytes Absolute 0.2 0.1 - 1.0 K/uL   Eosinophils Relative 0 %   Eosinophils Absolute 0.0 0.0 - 0.5 K/uL   Basophils Relative 2 %   Basophils Absolute 0.1 0.0 - 0.1 K/uL   WBC Morphology See Note    nRBC 0 0 /100 WBC   Abs Immature Granulocytes 0.00 0.00 - 0.07 K/uL  Urinalysis, Routine w reflex microscopic -Urine, Catheterized  Result Value Ref Range   Color, Urine YELLOW YELLOW   APPearance CLEAR CLEAR   Specific Gravity, Urine 1.018 1.005 - 1.030   pH 5.0 5.0 - 8.0   Glucose, UA NEGATIVE NEGATIVE mg/dL   Hgb urine dipstick SMALL (A) NEGATIVE   Bilirubin Urine NEGATIVE NEGATIVE   Ketones, ur NEGATIVE NEGATIVE mg/dL   Protein, ur NEGATIVE NEGATIVE mg/dL   Nitrite NEGATIVE NEGATIVE   Leukocytes,Ua TRACE (A) NEGATIVE   RBC / HPF 0-5 0 - 5 RBC/hpf   WBC, UA 6-10 0 - 5 WBC/hpf   Bacteria, UA NONE SEEN NONE SEEN   Squamous Epithelial / HPF 0-5 0 - 5 /HPF   Mucus PRESENT   Procalcitonin  Result Value Ref Range   Procalcitonin 0.10 ng/mL  Glucose, capillary  Result Value Ref Range   Glucose-Capillary 117 (H) 70 - 99 mg/dL  Glucose, capillary  Result Value Ref Range   Glucose-Capillary 147 (H) 70 - 99 mg/dL  Glucose, capillary  Result Value Ref Range   Glucose-Capillary  163 (H) 70 - 99 mg/dL  CBC with Differential/Platelet  Result Value Ref Range   WBC 3.0 (L) 4.0 - 10.5 K/uL   RBC 2.34 (L) 4.22 - 5.81 MIL/uL   Hemoglobin 8.2 (L) 13.0 - 17.0 g/dL   HCT 23.1 (L) 39.0 - 52.0 %   MCV 98.7 80.0 - 100.0 fL   MCH 35.0 (H) 26.0 - 34.0 pg   MCHC 35.5 30.0 - 36.0 g/dL   RDW 13.6 11.5 - 15.5 %   Platelets 124 (L) 150 - 400 K/uL   nRBC 0.0 0.0 - 0.2 %   Neutrophils Relative % 79 %   Neutro Abs 2.4 1.7 - 7.7 K/uL   Lymphocytes Relative 14 %   Lymphs Abs 0.4 (L) 0.7 - 4.0 K/uL   Monocytes Relative 3 %   Monocytes Absolute 0.1 0.1 - 1.0 K/uL   Eosinophils Relative 0 %   Eosinophils Absolute 0.0 0.0 - 0.5 K/uL   Basophils Relative 4 %   Basophils Absolute 0.1  0.0 - 0.1 K/uL   WBC Morphology See Note    nRBC 0 0 /100 WBC   Abs Immature Granulocytes 0.00 0.00 - 0.07 K/uL  Basic metabolic panel  Result Value Ref Range   Sodium 128 (L) 135 - 145 mmol/L   Potassium 3.6 3.5 - 5.1 mmol/L   Chloride 100 98 - 111 mmol/L   CO2 20 (L) 22 - 32 mmol/L   Glucose, Bld 120 (H) 70 - 99 mg/dL   BUN 19 8 - 23 mg/dL   Creatinine, Ser 1.00 0.61 - 1.24 mg/dL   Calcium 8.2 (L) 8.9 - 10.3 mg/dL   GFR, Estimated >60 >60 mL/min   Anion gap 8 5 - 15  Glucose, capillary  Result Value Ref Range   Glucose-Capillary 195 (H) 70 - 99 mg/dL  Glucose, capillary  Result Value Ref Range   Glucose-Capillary 152 (H) 70 - 99 mg/dL  CBC  Result Value Ref Range   WBC 2.0 (L) 4.0 - 10.5 K/uL   RBC 2.12 (L) 4.22 - 5.81 MIL/uL   Hemoglobin 7.4 (L) 13.0 - 17.0 g/dL   HCT 21.6 (L) 39.0 - 52.0 %   MCV 101.9 (H) 80.0 - 100.0 fL   MCH 34.9 (H) 26.0 - 34.0 pg   MCHC 34.3 30.0 - 36.0 g/dL   RDW 14.0 11.5 - 15.5 %   Platelets 100 (L) 150 - 400 K/uL   nRBC 0.0 0.0 - 0.2 %  Basic metabolic panel  Result Value Ref Range   Sodium 128 (L) 135 - 145 mmol/L   Potassium 3.4 (L) 3.5 - 5.1 mmol/L   Chloride 103 98 - 111 mmol/L   CO2 19 (L) 22 - 32 mmol/L   Glucose, Bld 101 (H) 70 - 99 mg/dL   BUN 20 8 - 23 mg/dL   Creatinine, Ser 0.98 0.61 - 1.24 mg/dL   Calcium 7.9 (L) 8.9 - 10.3 mg/dL   GFR, Estimated >60 >60 mL/min   Anion gap 6 5 - 15  Glucose, capillary  Result Value Ref Range   Glucose-Capillary 180 (H) 70 - 99 mg/dL  Glucose, capillary  Result Value Ref Range   Glucose-Capillary 92 70 - 99 mg/dL  Glucose, capillary  Result Value Ref Range   Glucose-Capillary 100 (H) 70 - 99 mg/dL  Sodium, urine, random  Result Value Ref Range   Sodium, Ur 95 mmol/L  Urinalysis, Routine w reflex microscopic -Urine, Catheterized; Indwelling urinary catheter  Result Value Ref  Range   Color, Urine YELLOW YELLOW   APPearance CLEAR CLEAR   Specific Gravity, Urine 1.016 1.005 - 1.030    pH 6.0 5.0 - 8.0   Glucose, UA NEGATIVE NEGATIVE mg/dL   Hgb urine dipstick SMALL (A) NEGATIVE   Bilirubin Urine NEGATIVE NEGATIVE   Ketones, ur NEGATIVE NEGATIVE mg/dL   Protein, ur NEGATIVE NEGATIVE mg/dL   Nitrite NEGATIVE NEGATIVE   Leukocytes,Ua NEGATIVE NEGATIVE   RBC / HPF 0-5 0 - 5 RBC/hpf   WBC, UA 0-5 0 - 5 WBC/hpf   Bacteria, UA NONE SEEN NONE SEEN   Squamous Epithelial / HPF 0-5 0 - 5 /HPF  Osmolality, urine  Result Value Ref Range   Osmolality, Ur 539 300 - 900 mOsm/kg  Osmolality  Result Value Ref Range   Osmolality 284 275 - 295 mOsm/kg  Magnesium  Result Value Ref Range   Magnesium 1.7 1.7 - 2.4 mg/dL  Cortisol  Result Value Ref Range   Cortisol, Plasma 13.4 ug/dL  TSH  Result Value Ref Range   TSH 2.144 0.350 - 4.500 uIU/mL  Glucose, capillary  Result Value Ref Range   Glucose-Capillary 134 (H) 70 - 99 mg/dL  POCT Activated clotting time  Result Value Ref Range   Activated Clotting Time 212 seconds  I-STAT, chem 8  Result Value Ref Range   Sodium 136 135 - 145 mmol/L   Potassium 4.0 3.5 - 5.1 mmol/L   Chloride 104 98 - 111 mmol/L   BUN 20 8 - 23 mg/dL   Creatinine, Ser 0.70 0.61 - 1.24 mg/dL   Glucose, Bld 150 (H) 70 - 99 mg/dL   Calcium, Ion 1.20 1.15 - 1.40 mmol/L   TCO2 21 (L) 22 - 32 mmol/L   Hemoglobin 9.2 (L) 13.0 - 17.0 g/dL   HCT 27.0 (L) 39.0 - 52.0 %  POCT Activated clotting time  Result Value Ref Range   Activated Clotting Time 255 seconds  POCT Activated clotting time  Result Value Ref Range   Activated Clotting Time 228 seconds  POCT Activated clotting time  Result Value Ref Range   Activated Clotting Time 282 seconds  POCT Activated clotting time  Result Value Ref Range   Activated Clotting Time 260 seconds  ECHOCARDIOGRAM COMPLETE  Result Value Ref Range   Weight 2,638.47 oz   Height 69 in   BP 97/67 mmHg   Single Plane A2C EF 73.1 %   Single Plane A4C EF 63.5 %   Calc EF 67.1 %   S' Lateral 2.70 cm   AR max vel 2.06  cm2   AV Area VTI 2.18 cm2   AV Mean grad 14.0 mmHg   AV Peak grad 21.9 mmHg   Ao pk vel 2.34 m/s   P 1/2 time 651 msec   Area-P 1/2 2.76 cm2   AV Area mean vel 1.85 cm2   MV VTI 2.22 cm2   Est EF 65 - 70%   Type and screen [place]  Result Value Ref Range   ABO/RH(D) O POS    Antibody Screen NEG    Sample Expiration 09/14/2022,2359    Unit Number [id number]    Blood Component Type RED CELLS,LR    Unit division 00    Status of Unit ISSUED    Transfusion Status OK TO TRANSFUSE    Crossmatch Result      Compatible Performed at [facility], [address] [place], [place] [address]   Prepare RBC (  crossmatch)  Result Value Ref Range   Order Confirmation      ORDER PROCESSED BY BLOOD BANK Performed at [facility], [place] [address] [place], [place] [address]   BPAM RBC  Result Value Ref Range   ISSUE DATE / TIME UA:[id number]    Blood Product Unit Number [id number]    PRODUCT CODE [id number]    Unit Type and Rh 5100    Blood Product Expiration Date [id number]   Troponin I (High Sensitivity)  Result Value Ref Range   Troponin I (High Sensitivity) 3,532 (HH) <18 ng/L  Troponin I (High Sensitivity)  Result Value Ref Range   Troponin I (High Sensitivity) 7,318 (HH) <18 ng/L   DG Chest 2 View  Result Date: 08/22/2022 CLINICAL DATA:  Questionable sepsis.  Multiple pulmonary metastases. EXAM: CHEST - 2 VIEW COMPARISON:  CT abdomen and pelvis no contrast 08/18/2022, CT chest with contrast 07/15/2022 FINDINGS: Heart size and vasculature are normal apart from calcification in the transverse aorta. There is a right chest port newly noted with IJ approach catheter terminating in the distal SVC. There are numerous nodular pulmonary metastases predominating in the lower zones. Largest on the left is in the upper lobe measuring 1.7 cm. Largest on the right is 2 cm in the middle lobe. Again noted is a small left pleural effusion. No focal pneumonia is  seen. The right sulci are sharp. There are degenerative changes and mild kyphodextroscoliosis of the thoracic spine with osteopenia. No thoracic compression fractures. IMPRESSION: 1. Numerous nodular pulmonary metastases. 2. Small left pleural effusion. 3. No evidence of pneumonia or edema. 4. Aortic atherosclerosis. 5. Right chest port newly noted. Electronically Signed   By: Telford Nab M.D.   On: 08/22/2022 00:34   CT ABDOMEN PELVIS WO CONTRAST  Result Date: 08/18/2022 CLINICAL DATA:  Abdominal pain, acute, nonlocalized EXAM: CT ABDOMEN AND PELVIS WITHOUT CONTRAST TECHNIQUE: Multidetector CT imaging of the abdomen and pelvis was performed following the standard protocol without IV contrast. RADIATION DOSE REDUCTION: This exam was performed according to the departmental dose-optimization program which includes automated exposure control, adjustment of the mA and/or kV according to patient size and/or use of iterative reconstruction technique. COMPARISON:  PET-CT 07/15/2022 FINDINGS: Lower chest: Stable small left pleural effusion. Coronary calcifications. Innumerable pulmonary nodules. Largest 2.8 cm in the right middle lobe, previously 2.4 cm. Largest left nodule in the anterior basal segment left lower lobe image 20/3 2.1 cm, previously 1.9. Hepatobiliary: No focal liver abnormality is seen. No gallstones, gallbladder wall thickening, or biliary dilatation. Pancreas: Diffuse parenchymal atrophy without mass or ductal dilatation. No regional inflammatory change. Spleen: Normal in size without focal abnormality. Adrenals/Urinary Tract: No adrenal mass. Left nephrolithiasis, largest stone 3 mm midpole. There is mild bilateral hydronephrosis right greater than left and ureterectasis down to thick-walled urinary bladder, partially decompressed by Foley catheter. Stomach/Bowel: Stomach is nondistended. Small bowel decompressed. Normal appendix. Colon is partially distended by gas and fecal material, with  scattered distal descending and sigmoid diverticula; no adjacent inflammatory change. Vascular/Lymphatic: Moderate scattered calcified aortoiliac calcified plaque without aneurysm. Retroaortic left renal vein, anatomic variant. No abdominal or pelvic adenopathy. Reproductive: Prostate enlargement with coarse calcifications Other: No ascites.  No free air. Musculoskeletal: Mild lumbar dextroscoliosis apex L3 with multilevel spondylitic change. Sclerotic lesions in bilateral inferior ischiopubic rami, left sacrum. For no fracture or acute process. IMPRESSION: 1. Mild bilateral hydronephrosis and ureterectasis down to thick-walled urinary bladder, partially decompressed by Foley catheter. 2. Right nephrolithiasis. 3. Innumerable  pulmonary nodules, some of which have increased in size. 4. Stable sacral and pelvic bone lesions. 5. Stable small left pleural effusion. 6. Descending and sigmoid diverticulosis. 7.  Aortic Atherosclerosis (ICD10-I70.0). Electronically Signed   By: Lucrezia Europe M.D.   On: 08/18/2022 17:52   IR IMAGING GUIDED PORT INSERTION  Result Date: 08/11/2022 INDICATION: 77 year old male referred for port catheter EXAM: IMAGE GUIDED PORT CATHETER MEDICATIONS: None ANESTHESIA/SEDATION: Moderate (conscious) sedation was employed during this procedure. A total of Versed 2.0 mg and Fentanyl 100 mcg was administered intravenously. Moderate Sedation Time: 18 minutes. The patient's level of consciousness and vital signs were monitored continuously by radiology nursing throughout the procedure under my direct supervision. FLUOROSCOPY TIME:  Fluoroscopy Time:   (1 mGy). COMPLICATIONS: None PROCEDURE: Informed written consent was obtained from the patient after a thorough discussion of the procedural risks, benefits and alternatives. All questions were addressed. Maximal Sterile Barrier Technique was utilized including caps, mask, sterile gowns, sterile gloves, sterile drape, hand hygiene and skin antiseptic. A  timeout was performed prior to the initiation of the procedure. Ultrasound survey was performed with images stored and sent to PACs. Right IJ vein documented to be patent. The right neck and chest was prepped with chlorhexidine, and draped in the usual sterile fashion using maximum barrier technique (cap and mask, sterile gown, sterile gloves, large sterile sheet, hand hygiene and cutaneous antiseptic). Local anesthesia was attained by infiltration with 1% lidocaine without epinephrine. Ultrasound demonstrated patency of the right internal jugular vein, and this was documented with an image. Under real-time ultrasound guidance, this vein was accessed with a 21 gauge micropuncture needle and image documentation was performed. A small dermatotomy was made at the access site with an 11 scalpel. A 0.018" wire was advanced into the SVC and used to estimate the length of the internal catheter. The access needle exchanged for a 38F micropuncture vascular sheath. The 0.018" wire was then removed and a 0.035" wire advanced into the IVC. An appropriate location for the subcutaneous reservoir was selected below the clavicle and an incision was made through the skin and underlying soft tissues. The subcutaneous tissues were then dissected using a combination of blunt and sharp surgical technique and a pocket was formed. A single lumen power injectable portacatheter was then tunneled through the subcutaneous tissues from the pocket to the dermatotomy and the port reservoir placed within the subcutaneous pocket. The venous access site was then serially dilated and a peel away vascular sheath placed over the wire. The wire was removed and the port catheter advanced into position under fluoroscopic guidance. The catheter tip is positioned in the cavoatrial junction. This was documented with a spot image. The portacatheter was then tested and found to flush and aspirate well. The pocket was then closed in two layers using first  subdermal inverted interrupted absorbable sutures followed by a running subcuticular suture. The epidermis was then sealed with Dermabond. Steri-Strips were placed. The dermatotomy at the venous access site was also seal with Dermabond. After completion, the [facility] needle was used to access the port and additional flush was performed to confirm positioning and function. The patient has immediate appointment for use. Patient tolerated the procedure well and remained hemodynamically stable throughout. No complications encountered and no significant blood loss encountered IMPRESSION: Status post right IJ port catheter placement. Signed, Dulcy Fanny. Nadene Rubins, RPVI [facility] Radiology Electronically Signed   By: Corrie Mckusick D.O.   On: 08/11/2022 10:23  EKG EKG Interpretation  Date/Time:  Monday September 07 2022 12:24:26 EDT Ventricular Rate:  72 PR Interval:  171 QRS Duration: 98 QT Interval:  397 QTC Calculation: 435 R Axis:   73 Text Interpretation: Sinus rhythm Inferior infarct, acute (LCx) Lateral leads are also involved >>> Acute MI <<< Confirmed by Cindee Lame [phone]) on 09/08/2022 9:45:45 PM  Radiology IR REMOVAL TUN ACCESS W/ PORT W/O FL MOD SED  Result Date: 09/10/2022 INDICATION: 77 year old with Enterococcus bacteremia. Request for Port-A-Cath removal. EXAM: REMOVAL RIGHT IJ VEIN PORT-A-CATH MEDICATIONS: Versed 1 mg ANESTHESIA/SEDATION: Patient was monitored by a radiology nurse throughout the procedure. FLUOROSCOPY: None COMPLICATIONS: None immediate. PROCEDURE: Informed written consent was obtained from the patient after a thorough discussion of the procedural risks, benefits and alternatives. All questions were addressed. Maximal Sterile Barrier Technique was utilized including caps, mask, sterile gowns, sterile gloves, sterile drape, hand hygiene and skin antiseptic. A timeout was performed prior to the initiation of the procedure.  The right chest was prepped and draped in a sterile fashion. Lidocaine was utilized for local anesthesia. An incision was made over the previously healed surgical incision. Utilizing blunt dissection, the port catheter and reservoir were removed from the underlying subcutaneous tissue in their entirety. The pocket was irrigated with a copious amount of sterile normal saline. The subcutaneous tissue was closed with 3-0 Vicryl interrupted subcutaneous stitches. A 4-0 Vicryl running subcuticular stitch was utilized to approximate the skin. Dermabond was applied. FINDINGS: Port was completely removed. Port pocket appeared healthy without fluid or drainage. IMPRESSION: Successful right IJ vein Port-A-Cath explant. Electronically Signed   By: Markus Daft M.D.   On: 09/10/2022 21:19    Procedures Procedures    Medications Ordered in ED Medications  0.9 %  sodium chloride infusion (has no administration in time range)  aspirin chewable tablet 324 mg (has no administration in time range)  heparin injection 5,000 Units (has no administration in time range)    ED Course/ Medical Decision Making/ A&P                             Medical Decision Making Problems Addressed: ACS (acute coronary syndrome) [facility]): acute illness or injury with systemic symptoms that poses a threat to life or bodily functions ST elevation myocardial infarction (STEMI) involving other coronary artery [facility]): acute illness or injury with systemic symptoms that poses a threat to life or bodily functions  Amount and/or Complexity of Data Reviewed External Data Reviewed: notes. Labs: ordered. Decision-making details documented in ED Course. Radiology: ordered and independent interpretation performed. Decision-making details documented in ED Course. ECG/medicine tests: ordered and independent interpretation performed. Decision-making details documented in ED Course. Discussion of management or test interpretation with external  provider(s): cardiology  Risk OTC drugs. Prescription drug management.   Iv ns. Continuous pulse ox and cardiac monitoring. Labs ordered/sent. Imaging ordered.   Differential diagnosis includes stemi/acs, msk cp, gi cp etc. Dispo decision including potential need for admission considered - will get labs and imaging and reassess.   Reviewed nursing notes and prior charts for additional history. External reports reviewed.   As soon as ecg done, code stemi activated. Asa. Heparin. Emergent consult to code stemi doc.   Cardiac monitor: sinus rhythm, rate 72.  Labs reviewed/interpreted by me - trop v high.   Xrays reviewed/interpreted by me - pnd.  Pt taken emergently to [facility] cath lab by EMS.   CRITICAL CARE RE STEMI, emergent  cath Performed by: Mirna Mires Total critical care time: 35 minutes Critical care time was exclusive of separately billable procedures and treating other patients. Critical care was necessary to treat or prevent imminent or life-threatening deterioration. Critical care was time spent personally by me on the following activities: development of treatment plan with patient and/or surrogate as well as nursing, discussions with consultants, evaluation of patient's response to treatment, examination of patient, obtaining history from patient or surrogate, ordering and performing treatments and interventions, ordering and review of laboratory studies, ordering and review of radiographic studies, pulse oximetry and re-evaluation of patient's condition.            Final Clinical Impression(s) / ED Diagnoses Final diagnoses:  None    Rx / DC Orders ED Discharge Orders     None         Lajean Saver, MD 09/11/22 1712

## 2022-09-07 NOTE — Progress Notes (Addendum)
Called for right groin rebleed. Manual pressure applied for 30 minutes.  Almyra Deforest present at bedside. Then femostop applied at 55mhg. No additional oozing noted. No hematoma present. Patient does have some slight bleeding from his penis. Dr. TAli Loweaware.   Right PT pulse easily palpable, dp present with doppler.    Femostop applied @ 19:35:00 at 444mh. Systolic BP 100000000t time of femostop placement.

## 2022-09-07 NOTE — Progress Notes (Signed)
Pt at the cancer center for a port flush w/ lab appointment. Pt brought to my attention that he has been having chest tightness and SOB w/o exertion that has gradually gotten worse since yesterday evening. VSS, Lisabeth Devoid., PA made aware. Sent to ED for work-up.

## 2022-09-07 NOTE — Progress Notes (Signed)
Called to check oozing right femoral cath site.  No hematoma or ecchymosis present. Manual pressure taken over and help for 20 minutes. Pressure dressing applied with the thrombi pad still in place. No additional oozing noted. Right pt and dp palpable.   Bedrest instructions given.

## 2022-09-08 ENCOUNTER — Other Ambulatory Visit (HOSPITAL_COMMUNITY): Payer: Self-pay

## 2022-09-08 ENCOUNTER — Encounter: Payer: Self-pay | Admitting: Internal Medicine

## 2022-09-08 ENCOUNTER — Encounter (HOSPITAL_COMMUNITY): Payer: Self-pay | Admitting: Internal Medicine

## 2022-09-08 ENCOUNTER — Inpatient Hospital Stay (HOSPITAL_COMMUNITY): Payer: Medicare Other

## 2022-09-08 DIAGNOSIS — R7303 Prediabetes: Secondary | ICD-10-CM | POA: Diagnosis not present

## 2022-09-08 DIAGNOSIS — I2119 ST elevation (STEMI) myocardial infarction involving other coronary artery of inferior wall: Secondary | ICD-10-CM

## 2022-09-08 DIAGNOSIS — I2510 Atherosclerotic heart disease of native coronary artery without angina pectoris: Secondary | ICD-10-CM | POA: Diagnosis present

## 2022-09-08 DIAGNOSIS — R319 Hematuria, unspecified: Secondary | ICD-10-CM | POA: Diagnosis present

## 2022-09-08 DIAGNOSIS — I25110 Atherosclerotic heart disease of native coronary artery with unstable angina pectoris: Secondary | ICD-10-CM | POA: Diagnosis present

## 2022-09-08 DIAGNOSIS — E785 Hyperlipidemia, unspecified: Secondary | ICD-10-CM

## 2022-09-08 DIAGNOSIS — I679 Cerebrovascular disease, unspecified: Secondary | ICD-10-CM

## 2022-09-08 LAB — ECHOCARDIOGRAM COMPLETE
AR max vel: 2.06 cm2
AV Area VTI: 2.18 cm2
AV Area mean vel: 1.85 cm2
AV Mean grad: 14 mmHg
AV Peak grad: 21.9 mmHg
Ao pk vel: 2.34 m/s
Area-P 1/2: 2.76 cm2
Calc EF: 67.1 %
Height: 69 in
MV VTI: 2.22 cm2
P 1/2 time: 651 msec
S' Lateral: 2.7 cm
Single Plane A2C EF: 73.1 %
Single Plane A4C EF: 63.5 %
Weight: 2638.47 oz

## 2022-09-08 LAB — HEMOGLOBIN A1C
Hgb A1c MFr Bld: 6.8 % — ABNORMAL HIGH (ref 4.8–5.6)
Mean Plasma Glucose: 148 mg/dL

## 2022-09-08 LAB — BASIC METABOLIC PANEL
Anion gap: 9 (ref 5–15)
BUN: 24 mg/dL — ABNORMAL HIGH (ref 8–23)
CO2: 20 mmol/L — ABNORMAL LOW (ref 22–32)
Calcium: 8.4 mg/dL — ABNORMAL LOW (ref 8.9–10.3)
Chloride: 105 mmol/L (ref 98–111)
Creatinine, Ser: 0.92 mg/dL (ref 0.61–1.24)
GFR, Estimated: >60 mL/min (ref >60)
Glucose, Bld: 135 mg/dL — ABNORMAL HIGH (ref 70–99)
Potassium: 4.4 mmol/L (ref 3.5–5.1)
Sodium: 134 mmol/L — ABNORMAL LOW (ref 135–145)

## 2022-09-08 LAB — CBC
HCT: 25.6 % — ABNORMAL LOW (ref 39.0–52.0)
Hemoglobin: 8.8 g/dL — ABNORMAL LOW (ref 13.0–17.0)
MCH: 34.5 pg — ABNORMAL HIGH (ref 26.0–34.0)
MCHC: 34.4 g/dL (ref 30.0–36.0)
MCV: 100.4 fL — ABNORMAL HIGH (ref 80.0–100.0)
Platelets: 270 10*3/uL (ref 150–400)
RBC: 2.55 MIL/uL — ABNORMAL LOW (ref 4.22–5.81)
RDW: 13.4 % (ref 11.5–15.5)
WBC: 8.1 10*3/uL (ref 4.0–10.5)
nRBC: 0 % (ref 0.0–0.2)

## 2022-09-08 LAB — GLUCOSE, CAPILLARY
Glucose-Capillary: 117 mg/dL — ABNORMAL HIGH (ref 70–99)
Glucose-Capillary: 137 mg/dL — ABNORMAL HIGH (ref 70–99)
Glucose-Capillary: 158 mg/dL — ABNORMAL HIGH (ref 70–99)
Glucose-Capillary: 145 mg/dL — ABNORMAL HIGH (ref 70–99)

## 2022-09-08 MED ORDER — CHLORHEXIDINE GLUCONATE CLOTH 2 % EX PADS
6.0000 | MEDICATED_PAD | Freq: Every day | CUTANEOUS | Status: DC
  Administered 2022-09-08 – 2022-09-14 (×8): 6 via TOPICAL

## 2022-09-08 MED ORDER — INSULIN ASPART 100 UNIT/ML IJ SOLN: 0.0000 [IU] | Freq: Every day | INTRAMUSCULAR | Status: DC

## 2022-09-08 MED ORDER — INSULIN ASPART 100 UNIT/ML IJ SOLN
0.0000 [IU] | Freq: Three times a day (TID) | INTRAMUSCULAR | Status: DC
  Administered 2022-09-08 – 2022-09-10 (×4): 2 [IU] via SUBCUTANEOUS
  Administered 2022-09-11 – 2022-09-15 (×4): 1 [IU] via SUBCUTANEOUS

## 2022-09-08 MED ORDER — PERFLUTREN LIPID MICROSPHERE
1.0000 mL | INTRAVENOUS | Status: AC | PRN
  Administered 2022-09-08: 2 mL via INTRAVENOUS

## 2022-09-08 MED ORDER — TAMSULOSIN HCL 0.4 MG PO CAPS
0.4000 mg | ORAL_CAPSULE | Freq: Two times a day (BID) | ORAL | Status: DC
  Administered 2022-09-08 – 2022-09-15 (×16): 0.4 mg via ORAL
  Filled 2022-09-08 (×16): qty 1

## 2022-09-08 MED ORDER — LIDOCAINE HCL URETHRAL/MUCOSAL 2 % EX GEL
1.0000 | Freq: Once | CUTANEOUS | Status: AC
  Administered 2022-09-08: 1 via URETHRAL
  Filled 2022-09-08 (×2): qty 6

## 2022-09-08 MED ORDER — METOPROLOL TARTRATE 12.5 MG HALF TABLET
12.5000 mg | ORAL_TABLET | Freq: Two times a day (BID) | ORAL | Status: DC
  Administered 2022-09-08 – 2022-09-15 (×15): 12.5 mg via ORAL
  Filled 2022-09-08 (×15): qty 1

## 2022-09-08 MED ORDER — LIDOCAINE HCL URETHRAL/MUCOSAL 2 % EX GEL
1.0000 | Freq: Once | CUTANEOUS | Status: AC
  Administered 2022-09-08: 1 via URETHRAL
  Filled 2022-09-08: qty 6

## 2022-09-08 MED FILL — Nitroglycerin IV Soln 100 MCG/ML in D5W: INTRA_ARTERIAL | Qty: 10 | Status: AC

## 2022-09-08 NOTE — TOC Benefit Eligibility Note (Signed)
Patient Advocate Encounter  Insurance verification completed.    The patient is currently admitted and upon discharge could be taking Brilinta 90 mg.  The current 30 day co-pay is $47.00.   The patient is insured through [employer] Medicare Part D   Russell Quinney, CPHT Pharmacy Patient Advocate Specialist [facility] Patient Advocate Team Direct Number: [phone]  Fax: [phone]       

## 2022-09-08 NOTE — Progress Notes (Signed)
CARDIAC REHAB PHASE I   PRE:  Rate/Rhythm: 80 NSR  BP:  Sitting: 101/51      SaO2: 98 RA  MODE:  Ambulation: 290 ft   AD:  RW  POST:  Rate/Rhythm: 105 ST  BP:  Sitting: 143/71      SaO2: 98 RA  Pt amb with standby  assistance, pt denies CP and SOB during amb and was returned to room w/o complaint.  Pt walked well  Explained CRP2 program to pt, pt is interested in program and would like to bed referred to [facility]   Christen Bame  1:58 PM 09/08/2022    Service time is from 1315 to 1400.

## 2022-09-08 NOTE — Consult Note (Signed)
Urology Consult   Reason for consult: difficult foley  History of Present Illness: Larry Kent is a 77 y.o. currently admitted with a STEMI. Nursing staff attempted foley placement and were not successful  Patient was seen by Dr Junious Silk. He has a history of metastatic prostate cancer and is undergoing chemo with Dr Julien Nordmann. He had urinary retention and obstructive uropathy at that time.  He reports that yesterday actually passed a voiding trial in our clinic.   Past Medical History:  Diagnosis Date   Allergy    Arthritis    both knees    BCC (basal cell carcinoma), arm, right 09/2019   MOHS ([place])   BCC (basal cell carcinoma), face 2014   L preauricular s/p MOHS   BCC (basal cell carcinoma), face 02/2018   L upper lip   BPH (benign prostatic hypertrophy)    Dr. Junious Silk @ [facility]   Carotid stenosis [id number]   RICA 123456, LICA 123456, L vertebral occlusion, rpt 1 yr   Cataract    removed both eyes    FHx: colon cancer    FHx: prostate cancer    Heart murmur    mild aortic stenosis    History of kidney stones    Hyperlipidemia    borderline- on rosuvastatin now normal    Occlusion of right vertebral artery 05/2015    Past Surgical History:  Procedure Laterality Date   BASAL CELL CARCINOMA EXCISION  05/2018   lip, 2020 x3  basal cells removed    CATARACT EXTRACTION  2008   Left   CATARACT EXTRACTION  2013   Right ([place])   COLONOSCOPY  ~2010   medium int hemorrhoids, o/w WNL, rpt 5 yrs given fmhx (Dr. Oletta Lamas)   COLONOSCOPY  08/2019   TAs, diverticulosis, rpt 3 yrs ([place])   ENDARTERECTOMY Right 12/01/2019   Procedure: RIGHT ENDARTERECTOMY CAROTID;  Surgeon: [name], MD;  Location: [place];  Service: Vascular;  Laterality: Right;   exercise treadmill  11/2005   WNL [place])   FINE NEEDLE ASPIRATION  07/28/2022   Procedure: FINE NEEDLE ASPIRATION (FNA) LINEAR;  Surgeon: Garner Nash, DO;  Location: [place] [facility];  Service: Pulmonary;;    HERNIA REPAIR  2008   Right   IR IMAGING GUIDED PORT INSERTION  08/11/2022   MOHS SURGERY Left spring 2014   basal cell face   PATCH ANGIOPLASTY Right 12/01/2019   Procedure: [place];  Surgeon: [name], MD;  Location: [place];  Service: Vascular;  Laterality: Right;   Testicular Biopsy  2003   benign, varicocele   US ECHOCARDIOGRAPHY  11/2005   aortic sclerosis, EF 0000000, diastolic dysfunction   VIDEO BRONCHOSCOPY WITH ENDOBRONCHIAL ULTRASOUND Bilateral 07/28/2022   Procedure: VIDEO BRONCHOSCOPY WITH ENDOBRONCHIAL ULTRASOUND;  Surgeon: Garner Nash, DO;  Location: [facility];  Service: Pulmonary;  Laterality: Bilateral;    Current Hospital Medications:  Home Meds:  No current facility-administered medications on file prior to encounter.   Current Outpatient Medications on File Prior to Encounter  Medication Sig Dispense Refill   acetaminophen (TYLENOL) 500 MG tablet Take 1,000 mg by mouth every 6 (six) hours as needed for mild pain or headache.     fluticasone (FLONASE) 50 MCG/ACT nasal spray Place 1 spray into both nostrils daily as needed for allergies or rhinitis.     lidocaine-prilocaine (EMLA) cream Apply to the Port-A-Cath site 30-60 minutes before chemotherapy treatment (Patient taking differently: Apply 1 Application topically See admin  instructions. Apply to the Port-A-Cath site 30-60 minutes before chemotherapy treatment) 30 g 0   Multiple Vitamin (MULTIVITAMIN) tablet Take 1 tablet by mouth daily with breakfast.     prochlorperazine (COMPAZINE) 10 MG tablet Take 1 tablet (10 mg total) by mouth every 6 (six) hours as needed for nausea or vomiting. 30 tablet 0   rosuvastatin (CRESTOR) 20 MG tablet TAKE 1 TABLET BY MOUTH EVERY DAY (Patient taking differently: Take 20 mg by mouth in the morning.) 90 tablet 1   SYSTANE ULTRA PF 0.4-0.3 % SOLN Place 1 drop into both eyes 3 (three) times daily as needed (for dryness/irritation).      tamsulosin (FLOMAX) 0.4 MG CAPS capsule Take 0.4 mg by mouth 2 (two) times daily.     Zinc 25 MG TABS Take 25 mg by mouth daily.       Scheduled Meds:  aspirin  81 mg Oral Daily   atorvastatin  80 mg Oral QHS   lidocaine  1 Application Urethral Once   melatonin  3 mg Oral QHS   sodium chloride flush  3 mL Intravenous Q12H   tamsulosin  0.4 mg Oral BID   Thrombi-Pad  1 each Topical Once   ticagrelor  90 mg Oral BID   Continuous Infusions:  sodium chloride Stopped (09/07/22 1640)   sodium chloride 100 mL/hr at 09/07/22 1800   sodium chloride     PRN Meds:.sodium chloride, acetaminophen, fentaNYL (SUBLIMAZE) injection, ondansetron (ZOFRAN) IV, sodium chloride flush  Allergies: No Known Allergies  Family History  Problem Relation Age of Onset   Cancer Father 76       prostate   Coronary artery disease Father 44       MI   Prostate cancer Father    Cancer Paternal Grandfather 46       prostate   Prostate cancer Paternal Grandfather    Stroke Mother    Hypertension Mother    Cancer Mother 28       colon   Colon cancer Mother    Coronary artery disease Maternal Uncle    Cancer Sister 83       breast   Breast cancer Sister    Colon cancer Maternal Grandmother    Diabetes Neg Hx    Colon polyps Neg Hx    Esophageal cancer Neg Hx    Rectal cancer Neg Hx    Stomach cancer Neg Hx     Social History:  reports that he has never smoked. He has never used smokeless tobacco. He reports that he does not currently use alcohol. He reports that he does not use drugs.  ROS: A complete review of systems was performed.  All systems are negative except for pertinent findings as noted.  Physical Exam:  Vital signs in last 24 hours: Temp:  [97.6 F (36.4 C)-99.2 F (37.3 C)] 99.2 F (37.3 C) (03/11 1900) Pulse Rate:  [55-81] 64 (03/11 1745) Resp:  [8-27] 14 (03/11 2000) BP: (89-156)/(55-79) 121/73 (03/11 2000) SpO2:  [86 %-100 %] 100 % (03/11 2000) Weight:  [83.5 kg] 83.5 kg  (03/11 1216) Constitutional:  Alert and oriented, No acute distress Cardiovascular: Regular rate and rhythm Respiratory: Normal respiratory effort, Lungs clear bilaterally GI: Abdomen is soft, nontender, nondistended, no abdominal masses Neurologic: Grossly intact, no focal deficits Psychiatric: Normal mood and affect  Laboratory Data:  Recent Labs    09/07/22 1111 09/07/22 1225 09/07/22 1324 09/08/22 0020  WBC 12.9* 13.0*  --  8.1  HGB 10.0*  10.2* 9.2* 8.8*  HCT 27.9* 30.5* 27.0* 25.6*  PLT 302 304  --  270    Recent Labs    09/07/22 1111 09/07/22 1225 09/07/22 1324  NA 136 136 136  K 4.3 4.4 4.0  CL 104 103 104  GLUCOSE 160* 159* 150*  BUN 24* 19 20  CALCIUM 9.1 9.0  --   CREATININE 0.88 0.94 0.70     Results for orders placed or performed during the hospital encounter of 09/07/22 (from the past 24 hour(s))  Basic metabolic panel     Status: Abnormal   Collection Time: 09/07/22 12:25 PM  Result Value Ref Range   Sodium 136 135 - 145 mmol/L   Potassium 4.4 3.5 - 5.1 mmol/L   Chloride 103 98 - 111 mmol/L   CO2 24 22 - 32 mmol/L   Glucose, Bld 159 (H) 70 - 99 mg/dL   BUN 19 8 - 23 mg/dL   Creatinine, Ser 0.94 0.61 - 1.24 mg/dL   Calcium 9.0 8.9 - 10.3 mg/dL   GFR, Estimated >60 >60 mL/min   Anion gap 9 5 - 15  CBC     Status: Abnormal   Collection Time: 09/07/22 12:25 PM  Result Value Ref Range   WBC 13.0 (H) 4.0 - 10.5 K/uL   RBC 3.00 (L) 4.22 - 5.81 MIL/uL   Hemoglobin 10.2 (L) 13.0 - 17.0 g/dL   HCT 30.5 (L) 39.0 - 52.0 %   MCV 101.7 (H) 80.0 - 100.0 fL   MCH 34.0 26.0 - 34.0 pg   MCHC 33.4 30.0 - 36.0 g/dL   RDW 13.3 11.5 - 15.5 %   Platelets 304 150 - 400 K/uL   nRBC 0.0 0.0 - 0.2 %  Troponin I (High Sensitivity)     Status: Abnormal   Collection Time: 09/07/22 12:25 PM  Result Value Ref Range   Troponin I (High Sensitivity) 3,532 (HH) <18 ng/L  Lactic acid, plasma     Status: None   Collection Time: 09/07/22  1:15 PM  Result Value Ref Range    Lactic Acid, Venous 1.9 0.5 - 1.9 mmol/L  Hemoglobin A1c     Status: Abnormal   Collection Time: 09/07/22  1:23 PM  Result Value Ref Range   Hgb A1c MFr Bld 6.8 (H) 4.8 - 5.6 %   Mean Plasma Glucose 148 mg/dL  Protime-INR     Status: None   Collection Time: 09/07/22  1:23 PM  Result Value Ref Range   Prothrombin Time 14.7 11.4 - 15.2 seconds   INR 1.2 0.8 - 1.2  APTT     Status: Abnormal   Collection Time: 09/07/22  1:23 PM  Result Value Ref Range   aPTT >200 (HH) 24 - 36 seconds  Lipid panel     Status: None   Collection Time: 09/07/22  1:23 PM  Result Value Ref Range   Cholesterol 137 0 - 200 mg/dL   Triglycerides 55 <150 mg/dL   HDL 62 >40 mg/dL   Total CHOL/HDL Ratio 2.2 RATIO   VLDL 11 0 - 40 mg/dL   LDL Cholesterol 64 0 - 99 mg/dL  I-STAT, chem 8     Status: Abnormal   Collection Time: 09/07/22  1:24 PM  Result Value Ref Range   Sodium 136 135 - 145 mmol/L   Potassium 4.0 3.5 - 5.1 mmol/L   Chloride 104 98 - 111 mmol/L   BUN 20 8 - 23 mg/dL   Creatinine, Ser 0.70 0.61 -  1.24 mg/dL   Glucose, Bld 150 (H) 70 - 99 mg/dL   Calcium, Ion 1.20 1.15 - 1.40 mmol/L   TCO2 21 (L) 22 - 32 mmol/L   Hemoglobin 9.2 (L) 13.0 - 17.0 g/dL   HCT 27.0 (L) 39.0 - 52.0 %  POCT Activated clotting time     Status: None   Collection Time: 09/07/22  1:33 PM  Result Value Ref Range   Activated Clotting Time 212 seconds  POCT Activated clotting time     Status: None   Collection Time: 09/07/22  1:51 PM  Result Value Ref Range   Activated Clotting Time 255 seconds  POCT Activated clotting time     Status: None   Collection Time: 09/07/22  2:26 PM  Result Value Ref Range   Activated Clotting Time 228 seconds  POCT Activated clotting time     Status: None   Collection Time: 09/07/22  2:42 PM  Result Value Ref Range   Activated Clotting Time 282 seconds  POCT Activated clotting time     Status: None   Collection Time: 09/07/22  3:10 PM  Result Value Ref Range   Activated Clotting Time  260 seconds  Lactic acid, plasma     Status: None   Collection Time: 09/07/22  4:39 PM  Result Value Ref Range   Lactic Acid, Venous 1.6 0.5 - 1.9 mmol/L  Troponin I (High Sensitivity)     Status: Abnormal   Collection Time: 09/07/22  4:39 PM  Result Value Ref Range   Troponin I (High Sensitivity) 7,318 (HH) <18 ng/L  CBC     Status: Abnormal   Collection Time: 09/08/22 12:20 AM  Result Value Ref Range   WBC 8.1 4.0 - 10.5 K/uL   RBC 2.55 (L) 4.22 - 5.81 MIL/uL   Hemoglobin 8.8 (L) 13.0 - 17.0 g/dL   HCT 25.6 (L) 39.0 - 52.0 %   MCV 100.4 (H) 80.0 - 100.0 fL   MCH 34.5 (H) 26.0 - 34.0 pg   MCHC 34.4 30.0 - 36.0 g/dL   RDW 13.4 11.5 - 15.5 %   Platelets 270 150 - 400 K/uL   nRBC 0.0 0.0 - 0.2 %   No results found for this or any previous visit (from the past 240 hour(s)).  Renal Function: Recent Labs    09/07/22 1111 09/07/22 1225 09/07/22 1324  CREATININE 0.88 0.94 0.70   Estimated Creatinine Clearance: 78.6 mL/min (by C-G formula based on SCr of 0.7 mg/dL).  Radiologic Imaging: CARDIAC CATHETERIZATION  Result Date: 09/07/2022   Ost RCA lesion is 80% stenosed.   Prox RCA lesion is 70% stenosed.   Mid LM to Dist LM lesion is 50% stenosed.   Dist LM to Ost LAD lesion is 50% stenosed.   Ost Cx lesion is 40% stenosed.   Prox Cx lesion is 60% stenosed.   Mid LAD lesion is 60% stenosed.   A stent was successfully placed.   Post intervention, there is a 0% residual stenosis.   Post intervention, there is a 0% residual stenosis. 1.  Thrombotic occlusion of right coronary artery treated with 4 overlapping drug-eluting stents. 2.  Distal left main, ostial LAD, and ostial left circumflex disease. 3.  LVEDP of 11 mmHg. Recommendation: The results were discussed with Dr. Ellyn Hack.  The patient will be admitted to the hospital, medical therapy will be pursued as well as an echocardiogram.  Will follow-up as an outpatient regarding treatment for residual disease.    I independently  reviewed  the above imaging studies.  Procedure Patient was prepped and draped in the usual sterile fashion. I carefully inserted a 16 fr coude catheter without issue. Balloon inflated with 10 cc sterile water, there was copious return of yellow urine  Impression/Recommendation Would leave foley in for at least 72 hours and then may be dc'd per the needs of the primary team. Would recommend a coude in the future for this patient if foley needs to be placed which could be attempted by nursing staff; would not do intermittent cath in hospital given his history.   Donald Pore MD 09/08/2022, 1:39 AM  [facility]  Pager: [phone]

## 2022-09-08 NOTE — Progress Notes (Addendum)
CARDIOLOGY ROUNDING PROGRESS NOTE  Patient Name: Larry Kent Date of Encounter: 09/08/2022  [facility] Cardiologist: Kathlyn Sacramento, MD   Patient Profile     77 y.o. male with a PHX of metastatic prostate cancer, lung nodules concerning for neuroendocrine carcinoma on biopsy undergoing palliative chemo/immunotherapy, carotid disease s/p carotid endarterectomy in 2021 for severe, asymptomatic stenosis, HLD presenting from oncologist office to [facility] ED for chest pain evaluation, found to have an inferior MI  on EKG, subsequently transferred to [facility] s/p cardiac catheterization with stent placement in the RCA  Principal Problem:   Acute ST elevation myocardial infarction (STEMI) of inferior wall ([place]) Active Problems:   Coronary artery disease involving native coronary artery of native heart with unstable angina pectoris ([place])   Left main coronary artery disease   Hyperlipidemia with target LDL less than 70   Cerebrovascular disease   Prediabetes   Hematuria   Subjective   Chief Complaint: Chest tightness and SOB with exertion -> Inferior STEMI  O/N: Oozing R femoral cath site without over hematoma or ecchymosis present s/p femostop at 45 mm hg without additonal oozing noted. Femostop applied. Pt and dp pulses were palpable confirmed by doppler.  Per report never did develop hematoma.  Patient was also noted to have mild bleeding per penis now status post foley placement. Per patient, patient followed up with [facility] yesterday prior to [facility] ED presentation for chest pain. He had had a indwelling foley catheter which was removed earlier in the day. Per patient, he had had scant blood per penis after the procedure. No overt pain but BPH LUTS present after foley was removed.  This AM patient denies CP, shortness of breath, n/v, changes in vision. S/p breakfast. Denies R arm or leg discomfort. Foley in place; denies suprapubic tenderness or abdominal discomfort. Blood pressure  and HR have remained stable  ROS:  All other ROS reviewed and negative. Pertinent positives noted in the HPI.     Inpatient Medications  Scheduled Meds:  aspirin  81 mg Oral Daily   atorvastatin  80 mg Oral QHS   Chlorhexidine Gluconate Cloth  6 each Topical Daily   insulin aspart  0-5 Units Subcutaneous QHS   insulin aspart  0-9 Units Subcutaneous TID WC   melatonin  3 mg Oral QHS   metoprolol tartrate  12.5 mg Oral BID   sodium chloride flush  3 mL Intravenous Q12H   tamsulosin  0.4 mg Oral BID   Thrombi-Pad  1 each Topical Once   ticagrelor  90 mg Oral BID   Continuous Infusions:  sodium chloride Stopped (09/07/22 1640)   sodium chloride     PRN Meds: sodium chloride, acetaminophen, fentaNYL (SUBLIMAZE) injection, ondansetron (ZOFRAN) IV, perflutren lipid microspheres (DEFINITY) IV suspension, sodium chloride flush   Vital Signs    Vitals:   09/08/22 0948 09/08/22 1000 09/08/22 1100 09/08/22 1101  BP: 97/67 113/65 110/65   Pulse: 76     Resp:  18 18   Temp:    99 F (37.2 C)  TempSrc:    Oral  SpO2:  96% 98%   Weight:      Height:        Intake/Output Summary (Last 24 hours) at 09/08/2022 1230 Last data filed at 09/08/2022 1000 Gross per 24 hour  Intake 1487.79 ml  Output 1875 ml  Net -387.21 ml      09/08/2022    6:05 AM 09/07/2022   12:16 PM 08/31/2022   10:56  AM  Last 3 Weights  Weight (lbs) 164 lb 14.5 oz 184 lb 181 lb 3.2 oz  Weight (kg) 74.8 kg 83.462 kg 82.192 kg      Telemetry  Overnight telemetry shows ST segment elevation from 2200-2300, otherwise NSR , which I personally reviewed.    Physical Exam   General: Well nourished, well developed, in no acute distress Head: Atraumatic, normal size  Eyes: PEERLA, EOMI  Neck: Supple, no JVD Endocrine: No thryomegaly Cardiac: Normal S1, S2; RRR; 1/6 harsh SEM at RUSB-carotid  Lungs: Clear to auscultation bilaterally, no wheezing, rhonchi or rales  Abd: Soft, nontender, no hepatomegaly, no suprapubic  tenderness Ext: No edema. R radial catheter access with mild ecchymosis now receeding without edema. Normal Allen's and reverse Barbeau's test. R femoral cath access site without ecchymoses or edema. PT and DP pulses present; no bruit.  Nontender Musculoskeletal: No deformities, BUE and BLE strength normal and equal Skin: Warm and dry, no rashes   Neuro: Alert and oriented to person, place, time, and situation, CNII-XII grossly intact, no focal deficits  Psych: Normal mood with very flat affect.  ECG  The most recent ECG on 09/08/2022 shows ST segment elevation in II, III, aVF, which I personally reviewed.   Labs    High Sensitivity Troponin:   Recent Labs  Lab 09/07/22 1225 09/07/22 1639  TROPONINIHS 3,532* 7,318*     Chemistry Recent Labs  Lab 09/07/22 1111 09/07/22 1225 09/07/22 1324 09/08/22 0020  NA 136 136 136 134*  K 4.3 4.4 4.0 4.4  CL 104 103 104 105  CO2 24 24  --  20*  GLUCOSE 160* 159* 150* 135*  BUN 24* 19 20 24*  CREATININE 0.88 0.94 0.70 0.92  CALCIUM 9.1 9.0  --  8.4*  PROT 6.8  --   --   --   ALBUMIN 3.7  --   --   --   AST 39  --   --   --   ALT 54*  --   --   --   ALKPHOS 160*  --   --   --   BILITOT 0.6  --   --   --   GFRNONAA >60 >60  --  >60  ANIONGAP 8 9  --  9    Lipids  Recent Labs  Lab 09/07/22 1323  CHOL 137  TRIG 55  HDL 62  LDLCALC 64  CHOLHDL 2.2    Hematology Recent Labs  Lab 09/07/22 1111 09/07/22 1225 09/07/22 1324 09/08/22 0020  WBC 12.9* 13.0*  --  8.1  RBC 2.82* 3.00*  --  2.55*  HGB 10.0* 10.2* 9.2* 8.8*  HCT 27.9* 30.5* 27.0* 25.6*  MCV 98.9 101.7*  --  100.4*  MCH 35.5* 34.0  --  34.5*  MCHC 35.8 33.4  --  34.4  RDW 13.2 13.3  --  13.4  PLT 302 304  --  270   PT 14.7 INR 1.2 aPTT >200  Thyroid No results for input(s): "TSH", "FREET4" in the last 168 hours.  BNPNo results for input(s): "BNP", "PROBNP" in the last 168 hours.  DDimer No results for input(s): "DDIMER" in the last 168 hours.   Radiology     CARDIAC CATHETERIZATION  Result Date: 09/07/2022   Ost RCA lesion is 80% stenosed.   Prox RCA lesion is 70% stenosed.   Mid LM to Dist LM lesion is 50% stenosed.   Dist LM to Ost LAD lesion is 50% stenosed.  Ost Cx lesion is 40% stenosed.   Prox Cx lesion is 60% stenosed.   Mid LAD lesion is 60% stenosed.   A stent was successfully placed.   Post intervention, there is a 0% residual stenosis.   Post intervention, there is a 0% residual stenosis. 1.  Thrombotic occlusion of right coronary artery treated with 4 overlapping drug-eluting stents. 2.  Distal left main, ostial LAD, and ostial left circumflex disease. 3.  LVEDP of 11 mmHg. Recommendation: The results were discussed with Dr. Ellyn Hack.  The patient will be admitted to the hospital, medical therapy will be pursued as well as an echocardiogram.  Will follow-up as an outpatient regarding treatment for residual disease.    Cardiac Studies - Summarized   Cardiac cath-PCI RCA 09/07/2022: Diagnostic Dominance: Right     Intervention          09/23/2021 VAS US carotid duplex Summary:  Right Carotid: There is no evidence of stenosis in the right ICA.  Left Carotid: Velocities in the left ICA are consistent with a 1-39% stenosis.  Vertebrals: Right vertebral artery demonstrates antegrade flow. Left  vertebral artery demonstrates high resistant flow.  Subclavians: Normal flow hemodynamics were seen in bilateral subclavian arteries.   10/22/2020 ECHO complete LVEF 55-60%. Left ventricle with normal function. Moderate left ventricular hypertrophy. Grade II diastolic dysfunction. Elevated L atrial pressure R ventricle with normal function with mild enlargement.  Mitral valve abnormal with no evidence of MV regurgitation or evidence of mitral stenosis Mild calcification of the aortic valve. Mild thickening. Trivial AV regurgitation  Assessment & Plan   Inferior STEMI ST elevation leads II, III, and aVF S/p LHC with distal occlusion of RCA  with 4x DES Ostial left circumflex, distal and ostial LAD stenoses not stented -Follow up TTE today -Will continue medical management without plans for repeat LHC and stent placement unless patient is symptomatic -Continue ASA and Brillinta therapy for at least 12 months given s/p 4 stents and high risk for occlusion -Will start on metoprolol tartrate 12.5 mg BID -Monitor for Brillinta side effects including dyspnea, bradicardia, and PLT count -Recommend stress PET scan in ~1 month and monitor for anginal symptoms to evaluate need for LAD revascularization  Metastatic Carcinoma Suspicious primary sites prostate vs lung cancer Metastasis to LN and bones Diagnosed 06/2022 Currently on the palliative chemotherapy/immunotherapy regimen (stated below) c/b pancytopenia now stable --Carboplatin for AUC of 5 on day 1 --Imfinzi 1500 Mg IV on day 1 --Etoposide 100 Mg/M2 on days 1, 2 and 3 with Neulasta support every 3 weeks status post 1 cycle.  Followed at the [facility] with Dr. Julien Nordmann; recently completed second cycle with plans to follow up imaging on 09/17/2022 --Will need to discuss further palliative treatment given increased risk of bleeding on current DAPT regimen.  Will forward this note and send a secure chat to Dr. Julien Nordmann CTM platelets  Prostatic adenocarcinoma Hematuria Biopsy positive, diagnosed 07/2022 Followed by Dr. Junious Silk S/p  outpatient indwelling foley catheter removal with scant gross hematuria and BPH symptoms Stable Hgb today -Foley present -Continue on Tamsulosin -Urology consulted; appreciate recommendations  Asymptomatic Aortic sclerosis CTM => plan for 2D echo this hospitalization.  Prediabetes A1c 6.4 -SSI  HLD Previously on Crestor 20 mg Now on Lipitor 80 mg daily => would anticipate switching to Crestor 40 mg on discharge.  Mobility Patient cleared to ambulate with assistance >8hr post Femostop  Disposition Pending Echo results and HDS on  monitoring, patient may be able to transfer out  of ICU tomorrow  Discussed with Cardiology attending, Dr. Reece Agar, MD [facility] Internal Medicine Program 09/08/2022, 12:30 PM For questions or updates, please contact [place] Please consult [email or web address] for contact info under    [place]  I have seen, examined and evaluated the patient this AM on rounds along with Dr. Simeon Craft (Resident).  After reviewing all the available data and chart, we discussed the patients laboratory, study & physical findings as well as symptoms in detail.  I agree with her findings, examination as well as impression recommendations as per our discussion.    Assessment plan was actually based on our discussion.  Therefore no new major adjustments made.  Physical exam performed together.  Looks pretty well after inferior STEMI.  He had 4 stents placed in the RCA very difficult to engage RCA and therefore difficult to image fully this led to sequential stenting with the most distal stent being because of a distal edge dissection.  Final results look excellent.  We discussed the left main LAD and circumflex disease and felt that neither lesion was visually flow-limiting and would be stable for outpatient assessment ischemic evaluation with stress PET in about a month. We will gradually titrate on cardiac medications as tolerated.  His oncologist been notified of this hospitalization as well as the urology team in order to determine how to proceed forward with his oncologic therapy in the setting of STEMI.  Started low-dose beta-blocker which will convert to long-acting Toprol on discharge.  He is already on aspirin and Brilinta as well as atorvastatin.  Would convert to Crestor 40 mg on discharge since he is already on 20 mg at home.  Echo pending.  Plan will be to follow-up echocardiogram and titrate medications likely this afternoon tomorrow depending on how  he does tomorrow would anticipate discharge as early as tomorrow number most likely on the 14th in the morning.    Leonie Man, MD, MS Glenetta Hew, M.D., M.S. Interventional Cardiologist  [place]  Pager # [phone] Phone # [phone] [address] [place], [place] [address]

## 2022-09-08 NOTE — Progress Notes (Signed)
  Echocardiogram 2D Echocardiogram has been performed.  Larry Kent 09/08/2022, 12:01 PM

## 2022-09-09 ENCOUNTER — Inpatient Hospital Stay (HOSPITAL_COMMUNITY): Payer: Medicare Other

## 2022-09-09 DIAGNOSIS — Z955 Presence of coronary angioplasty implant and graft: Secondary | ICD-10-CM | POA: Diagnosis not present

## 2022-09-09 DIAGNOSIS — I25110 Atherosclerotic heart disease of native coronary artery with unstable angina pectoris: Secondary | ICD-10-CM | POA: Diagnosis not present

## 2022-09-09 DIAGNOSIS — I2119 ST elevation (STEMI) myocardial infarction involving other coronary artery of inferior wall: Secondary | ICD-10-CM | POA: Diagnosis not present

## 2022-09-09 DIAGNOSIS — Z960 Presence of urogenital implants: Secondary | ICD-10-CM

## 2022-09-09 DIAGNOSIS — R509 Fever, unspecified: Secondary | ICD-10-CM

## 2022-09-09 DIAGNOSIS — I679 Cerebrovascular disease, unspecified: Secondary | ICD-10-CM | POA: Diagnosis not present

## 2022-09-09 DIAGNOSIS — E785 Hyperlipidemia, unspecified: Secondary | ICD-10-CM | POA: Diagnosis not present

## 2022-09-09 DIAGNOSIS — R7303 Prediabetes: Secondary | ICD-10-CM | POA: Diagnosis not present

## 2022-09-09 LAB — BLOOD CULTURE ID PANEL (REFLEXED) - BCID2

## 2022-09-09 LAB — GLUCOSE, CAPILLARY
Glucose-Capillary: 114 mg/dL — ABNORMAL HIGH (ref 70–99)
Glucose-Capillary: 117 mg/dL — ABNORMAL HIGH (ref 70–99)
Glucose-Capillary: 147 mg/dL — ABNORMAL HIGH (ref 70–99)
Glucose-Capillary: 163 mg/dL — ABNORMAL HIGH (ref 70–99)

## 2022-09-09 LAB — CBC WITH DIFFERENTIAL/PLATELET
Abs Immature Granulocytes: 0 10*3/uL (ref 0.00–0.07)
Basophils Absolute: 0.1 10*3/uL (ref 0.0–0.1)
Basophils Relative: 2 %
Eosinophils Absolute: 0 10*3/uL (ref 0.0–0.5)
Eosinophils Relative: 0 %
HCT: 23.4 % — ABNORMAL LOW (ref 39.0–52.0)
Hemoglobin: 8.3 g/dL — ABNORMAL LOW (ref 13.0–17.0)
Lymphocytes Relative: 10 %
Lymphs Abs: 0.4 10*3/uL — ABNORMAL LOW (ref 0.7–4.0)
MCH: 35.5 pg — ABNORMAL HIGH (ref 26.0–34.0)
MCHC: 35.5 g/dL (ref 30.0–36.0)
MCV: 100 fL (ref 80.0–100.0)
Monocytes Absolute: 0.2 10*3/uL (ref 0.1–1.0)
Monocytes Relative: 5 %
Neutro Abs: 3.2 10*3/uL (ref 1.7–7.7)
Neutrophils Relative %: 83 %
Platelets: 182 10*3/uL (ref 150–400)
RBC: 2.34 MIL/uL — ABNORMAL LOW (ref 4.22–5.81)
RDW: 13.7 % (ref 11.5–15.5)
WBC: 3.8 10*3/uL — ABNORMAL LOW (ref 4.0–10.5)
nRBC: 0 % (ref 0.0–0.2)
nRBC: 0 /100 WBC

## 2022-09-09 LAB — URINALYSIS, ROUTINE W REFLEX MICROSCOPIC
Bacteria, UA: NONE SEEN
Bilirubin Urine: NEGATIVE
Glucose, UA: NEGATIVE mg/dL
Ketones, ur: NEGATIVE mg/dL
Nitrite: NEGATIVE
Protein, ur: NEGATIVE mg/dL
Specific Gravity, Urine: 1.018 (ref 1.005–1.030)
pH: 5 (ref 5.0–8.0)

## 2022-09-09 LAB — BASIC METABOLIC PANEL
Anion gap: 13 (ref 5–15)
BUN: 23 mg/dL (ref 8–23)
CO2: 17 mmol/L — ABNORMAL LOW (ref 22–32)
Calcium: 8.4 mg/dL — ABNORMAL LOW (ref 8.9–10.3)
Chloride: 99 mmol/L (ref 98–111)
Creatinine, Ser: 1 mg/dL (ref 0.61–1.24)
GFR, Estimated: >60 mL/min (ref >60)
Glucose, Bld: 113 mg/dL — ABNORMAL HIGH (ref 70–99)
Potassium: 3.8 mmol/L (ref 3.5–5.1)
Sodium: 129 mmol/L — ABNORMAL LOW (ref 135–145)

## 2022-09-09 LAB — PROCALCITONIN: Procalcitonin: 0.1 ng/mL

## 2022-09-09 LAB — CBC
HCT: 22.9 % — ABNORMAL LOW (ref 39.0–52.0)
Hemoglobin: 8.2 g/dL — ABNORMAL LOW (ref 13.0–17.0)
MCH: 35.5 pg — ABNORMAL HIGH (ref 26.0–34.0)
MCHC: 35.8 g/dL (ref 30.0–36.0)
MCV: 99.1 fL (ref 80.0–100.0)
Platelets: 183 10*3/uL (ref 150–400)
RBC: 2.31 MIL/uL — ABNORMAL LOW (ref 4.22–5.81)
RDW: 13.7 % (ref 11.5–15.5)
WBC: 4.3 10*3/uL (ref 4.0–10.5)
nRBC: 0 % (ref 0.0–0.2)

## 2022-09-09 LAB — LIPOPROTEIN A (LPA): Lipoprotein (a): 164.1 nmol/L — ABNORMAL HIGH (ref <75.0)

## 2022-09-09 MED ORDER — SODIUM CHLORIDE 0.9 % IV SOLN
2.0000 g | INTRAVENOUS | Status: DC
  Administered 2022-09-09: 2 g via INTRAVENOUS
  Filled 2022-09-09: qty 20

## 2022-09-09 NOTE — Consult Note (Signed)
NAME:  Larry Kent, MRN:  [id number], DOB:  07/11/1945, LOS: 2 ADMISSION DATE:  09/07/2022, CONSULTATION DATE: 09/27/2022 REFERRING MD:  Dr. Ellyn Hack, CHIEF COMPLAINT: Fever  History of Present Illness:  77 year old male with metastatic prostate cancer on palliative chemotherapy/immunotherapy, coronary artery disease who initially presented with acute inferior wall STEMI, underwent cardiac cath and stent placement.  This morning patient started spiking fever with Tmax 102, denies cough, shortness of breath, dysuria, urgency, frequency, chills or other complaints. Of note patient has hesitancy of urination, he was retaining required a coud catheter placement yesterday. His white count has trended down to 3.8  Pertinent  Medical History   Past Medical History:  Diagnosis Date   Allergy    Arthritis    both knees    BCC (basal cell carcinoma), arm, right 09/2019   MOHS ([place])   BCC (basal cell carcinoma), face 2014   L preauricular s/p MOHS   BCC (basal cell carcinoma), face 02/2018   L upper lip   BPH (benign prostatic hypertrophy)    Dr. Junious Silk @ [facility]   Carotid stenosis 123XX123   RICA 123456, LICA 123456, L vertebral occlusion, rpt 1 yr   Cataract    removed both eyes    FHx: colon cancer    FHx: prostate cancer    Heart murmur    mild aortic stenosis    History of kidney stones    Hyperlipidemia    borderline- on rosuvastatin now normal    Occlusion of right vertebral artery 05/2015     Significant Hospital Events: Including procedures, antibiotic start and stop dates in addition to other pertinent events     Interim History / Subjective:    Objective   Blood pressure 105/60, pulse 76, temperature 99.5 F (37.5 C), temperature source Oral, resp. rate (!) 25, height '5\' 9"'$  (1.753 m), weight 74.2 kg, SpO2 94 %.        Intake/Output Summary (Last 24 hours) at 09/09/2022 1052 Last data filed at 09/09/2022 0630 Gross per 24 hour  Intake 120 ml  Output 1665  ml  Net -1545 ml   Filed Weights   09/07/22 1216 09/08/22 0605 09/09/22 0600  Weight: 83.5 kg 74.8 kg 74.2 kg    Examination: Physical exam: General: Elderly male, lying on the bed HEENT: [place]/AT, eyes anicteric.  moist mucus membranes Neuro: Alert, awake following commands Chest: Coarse breath sounds, no wheezes or rhonchi Heart: Regular rate and rhythm, no murmurs or gallops Abdomen: Soft, nontender, nondistended, bowel sounds present Skin: No rash  Labs and images were reviewed  Resolved Hospital Problem list     Assessment & Plan:  Acute inferior STEMI s/p stent Hyperlipidemia Defer management to primary team  Febrile illness, with unknown source of infection Patient is on palliative chemotherapy with white count 3.8 He started spiking this morning with Tmax 102 became tachycardic No known source of infection at this time He had urinary retention, unable to place Foley catheter, coud catheter was placed yesterday Though he does not have urinary symptoms but he has Foley catheter so his fever could be related to acute UTI Will obtain UA Will get blood cultures and x-ray chest Will get procalcitonin level Started on ceftriaxone  Metastatic prostate cancer to bones Patient is on palliative chemotherapy Outpatient follow-up with hematology  Prediabetes Patient hemoglobin A1c 6.4 Continue sliding scale insulin Monitor fingerstick with goal 140-180  Best Practice (right click and "Reselect all SmartList Selections" daily)   Diet/type: Regular consistency (see  orders) DVT prophylaxis: SCD GI prophylaxis: N/A Lines: N/A Foley:  Yes, and it is still needed Code Status:  full code Last date of multidisciplinary goals of care discussion [Per primary team]  Labs   CBC: Recent Labs  Lab 09/07/22 1111 09/07/22 1225 09/07/22 1324 09/08/22 0020 09/09/22 0740 09/09/22 0932  WBC 12.9* 13.0*  --  8.1 4.3 3.8*  NEUTROABS 11.0*  --   --   --   --  PENDING  HGB  10.0* 10.2* 9.2* 8.8* 8.2* 8.3*  HCT 27.9* 30.5* 27.0* 25.6* 22.9* 23.4*  MCV 98.9 101.7*  --  100.4* 99.1 100.0  PLT 302 304  --  270 183 [id number]    Basic Metabolic Panel: Recent Labs  Lab 09/07/22 1111 09/07/22 1225 09/07/22 1324 09/08/22 0020 09/09/22 0740  NA 136 136 136 134* 129*  K 4.3 4.4 4.0 4.4 3.8  CL 104 103 104 105 99  CO2 24 24  --  20* 17*  GLUCOSE 160* 159* 150* 135* 113*  BUN 24* 19 20 24* 23  CREATININE 0.88 0.94 0.70 0.92 1.00  CALCIUM 9.1 9.0  --  8.4* 8.4*   GFR: Estimated Creatinine Clearance: 62.8 mL/min (by C-G formula based on SCr of 1 mg/dL). Recent Labs  Lab 09/07/22 1225 09/07/22 1315 09/07/22 1639 09/08/22 0020 09/09/22 0740 09/09/22 0932  WBC 13.0*  --   --  8.1 4.3 3.8*  LATICACIDVEN  --  1.9 1.6  --   --   --     Liver Function Tests: Recent Labs  Lab 09/07/22 1111  AST 39  ALT 54*  ALKPHOS 160*  BILITOT 0.6  PROT 6.8  ALBUMIN 3.7   No results for input(s): "LIPASE", "AMYLASE" in the last 168 hours. No results for input(s): "AMMONIA" in the last 168 hours.  ABG    Component Value Date/Time   TCO2 21 (L) 09/07/2022 1324     Coagulation Profile: Recent Labs  Lab 09/07/22 1323  INR 1.2    Cardiac Enzymes: No results for input(s): "CKTOTAL", "CKMB", "CKMBINDEX", "TROPONINI" in the last 168 hours.  HbA1C: Hgb A1c MFr Bld  Date/Time Value Ref Range Status  09/07/2022 01:23 PM 6.8 (H) 4.8 - 5.6 % Final    Comment:    (NOTE)         Prediabetes: 5.7 - 6.4         Diabetes: >6.4         Glycemic control for adults with diabetes: <7.0   09/23/2021 09:05 AM 6.1 4.6 - 6.5 % Final    Comment:    Glycemic Control Guidelines for People with Diabetes:Non Diabetic:  <6%Goal of Therapy: <7%Additional Action Suggested:  >8%     CBG: Recent Labs  Lab 09/08/22 1113 09/08/22 1531 09/08/22 1717 09/08/22 2126 09/09/22 0622  GLUCAP 117* 137* 158* 145* 114*    Review of Systems:   12-point Review of systems mentioned HPI,  rest  Past Medical History:  He,  has a past medical history of Allergy, Arthritis, BCC (basal cell carcinoma), arm, right (09/2019), BCC (basal cell carcinoma), face (2014), BCC (basal cell carcinoma), face (02/2018), BPH (benign prostatic hypertrophy), Carotid stenosis (05/2015), Cataract, FHx: colon cancer, FHx: prostate cancer, Heart murmur, History of kidney stones, Hyperlipidemia, and Occlusion of right vertebral artery (05/2015).   Surgical History:   Past Surgical History:  Procedure Laterality Date   BASAL CELL CARCINOMA EXCISION  05/2018   lip, 2020 x3  basal cells removed    CATARACT  EXTRACTION  2008   Left   CATARACT EXTRACTION  2013   Right ([place])   COLONOSCOPY  ~2010   medium int hemorrhoids, o/w WNL, rpt 5 yrs given fmhx (Dr. Oletta Lamas)   COLONOSCOPY  08/2019   TAs, diverticulosis, rpt 3 yrs ([place])   CORONARY/GRAFT ACUTE MI REVASCULARIZATION N/A 09/07/2022   Procedure: Coronary/Graft Acute MI Revascularization;  Surgeon: Early Osmond, MD;  Location: [facility];  Service: Cardiovascular;  Laterality: N/A;   ENDARTERECTOMY Right 12/01/2019   Procedure: RIGHT ENDARTERECTOMY CAROTID;  Surgeon: [name], MD;  Location: [facility];  Service: Vascular;  Laterality: Right;   exercise treadmill  11/2005   WNL [place])   FINE NEEDLE ASPIRATION  07/28/2022   Procedure: FINE NEEDLE ASPIRATION (FNA) LINEAR;  Surgeon: Garner Nash, DO;  Location: [facility];  Service: Pulmonary;;   HERNIA REPAIR  2008   Right   IR IMAGING GUIDED PORT INSERTION  08/11/2022   LEFT HEART CATH AND CORONARY ANGIOGRAPHY N/A 09/07/2022   Procedure: LEFT HEART CATH AND CORONARY ANGIOGRAPHY;  Surgeon: Early Osmond, MD;  Location: [facility];  Service: Cardiovascular;  Laterality: N/A;   MOHS SURGERY Left spring 2014   basal cell face   PATCH ANGIOPLASTY Right 12/01/2019   Procedure: [place];  Surgeon: [name], MD;   Location: [facility];  Service: Vascular;  Laterality: Right;   Testicular Biopsy  2003   benign, varicocele   US ECHOCARDIOGRAPHY  11/2005   aortic sclerosis, EF 0000000, diastolic dysfunction   VIDEO BRONCHOSCOPY WITH ENDOBRONCHIAL ULTRASOUND Bilateral 07/28/2022   Procedure: VIDEO BRONCHOSCOPY WITH ENDOBRONCHIAL ULTRASOUND;  Surgeon: Garner Nash, DO;  Location: [facility];  Service: Pulmonary;  Laterality: Bilateral;     Social History:   reports that he has never smoked. He has never used smokeless tobacco. He reports that he does not currently use alcohol. He reports that he does not use drugs.   Family History:  His family history includes Breast cancer in his sister; Cancer (age of onset: 66) in his sister; Cancer (age of onset: 66) in his paternal grandfather; Cancer (age of onset: 34) in his father; Cancer (age of onset: 39) in his mother; Colon cancer in his maternal grandmother and mother; Coronary artery disease in his maternal uncle; Coronary artery disease (age of onset: 62) in his father; Hypertension in his mother; Prostate cancer in his father and paternal grandfather; Stroke in his mother. There is no history of Diabetes, Colon polyps, Esophageal cancer, Rectal cancer, or Stomach cancer.   Allergies No Known Allergies   Home Medications  Prior to Admission medications   Medication Sig Start Date End Date Taking? Authorizing Provider  acetaminophen (TYLENOL) 500 MG tablet Take 1,000 mg by mouth every 6 (six) hours as needed for mild pain or headache.   Yes [provider]  fluticasone (FLONASE) 50 MCG/ACT nasal spray Place 1 spray into both nostrils daily as needed for allergies or rhinitis.   Yes [provider]  lidocaine-prilocaine (EMLA) cream Apply to the Port-A-Cath site 30-60 minutes before chemotherapy treatment Patient taking differently: Apply 1 Application topically See admin instructions. Apply to the Port-A-Cath site 30-60 minutes before  chemotherapy treatment 08/03/22  Yes Curt Bears, MD  Multiple Vitamin (MULTIVITAMIN) tablet Take 1 tablet by mouth daily with breakfast.   Yes [provider]  prochlorperazine (COMPAZINE) 10 MG tablet Take 1 tablet (10 mg total) by mouth every 6 (six) hours  as needed for nausea or vomiting. 08/03/22  Yes Curt Bears, MD  rosuvastatin (CRESTOR) 20 MG tablet TAKE 1 TABLET BY MOUTH EVERY DAY Patient taking differently: Take 20 mg by mouth in the morning. 04/28/22  Yes Ria Bush, MD  SYSTANE ULTRA PF 0.4-0.3 % SOLN Place 1 drop into both eyes 3 (three) times daily as needed (for dryness/irritation).   Yes [provider]  tamsulosin (FLOMAX) 0.4 MG CAPS capsule Take 0.4 mg by mouth 2 (two) times daily.   Yes [provider]  Zinc 25 MG TABS Take 25 mg by mouth daily.   Yes [provider]      Jacky Kindle, MD [facility] See Amion for pager If no response to pager, please call [phone] until 7pm After 7pm, Please call E-link [phone]

## 2022-09-09 NOTE — Progress Notes (Cosign Needed Addendum)
CARDIOLOGY ROUNDING PROGRESS NOTE  Patient Name: Larry Kent Date of Encounter: 09/10/2022  [facility] Cardiologist: Kathlyn Sacramento, MD   Patient Profile     77 y.o. male with a PHX of metastatic prostate cancer, lung nodules concerning for neuroendocrine carcinoma on biopsy undergoing palliative chemo/immunotherapy, carotid disease s/p carotid endarterectomy in 2021 for severe, asymptomatic stenosis, HLD presenting from oncologist office to [facility] ED for chest pain evaluation, found to have an inferior MI  on EKG, subsequently transferred to [facility] s/p cardiac catheterization with stent placement in the RCA  Principal Problem:   Acute ST elevation myocardial infarction (STEMI) of inferior wall ([place]) Active Problems:   Coronary artery disease involving native coronary artery of native heart with unstable angina pectoris ([place])   Left main coronary artery disease   Hyperlipidemia with target LDL less than 70   Cerebrovascular disease   Prediabetes   Indwelling urinary catheter present   Hematuria   Fever   Subjective   Chief Complaint: Chest tightness and SOB with exertion -> Inferior STEMI  O/N: Tachycardic to low 100s and with fever to 102; received a dose of acetaminophen. Patient denies chest pain, shortness of breath, new cough, abdominal discomfort. No discomfort at port site on R chest or at cath sites. No suprapubic tenderness or abdominal discomfort. No nausea, vomiting this AM  ROS:  All other ROS reviewed and negative. Pertinent positives noted in the HPI.     Inpatient Medications  Scheduled Meds:  aspirin  81 mg Oral Daily   atorvastatin  80 mg Oral QHS   Chlorhexidine Gluconate Cloth  6 each Topical Daily   insulin aspart  0-5 Units Subcutaneous QHS   insulin aspart  0-9 Units Subcutaneous TID WC   melatonin  3 mg Oral QHS   metoprolol tartrate  12.5 mg Oral BID   sodium chloride flush  3 mL Intravenous Q12H   tamsulosin  0.4 mg Oral BID   Thrombi-Pad  1  each Topical Once   ticagrelor  90 mg Oral BID   Continuous Infusions:  sodium chloride Stopped (09/07/22 1640)   sodium chloride     ampicillin (OMNIPEN) IV     PRN Meds: sodium chloride, acetaminophen, fentaNYL (SUBLIMAZE) injection, ondansetron (ZOFRAN) IV, sodium chloride flush   Vital Signs    Vitals:   09/09/22 1700 09/09/22 1900 09/09/22 2000 09/09/22 2300  BP: (!) 103/53 (!) 112/55 122/62   Pulse:      Resp: (!) 21 20 (!) 26   Temp:  (!) 100.8 F (38.2 C)  99.3 F (37.4 C)  TempSrc:  Oral  Oral  SpO2: 95% 97% 99%   Weight:      Height:        Intake/Output Summary (Last 24 hours) at 09/10/2022 0024 Last data filed at 09/09/2022 2300 Gross per 24 hour  Intake 580.07 ml  Output 1880 ml  Net -1299.93 ml      09/09/2022    6:00 AM 09/08/2022    6:05 AM 09/07/2022   12:16 PM  Last 3 Weights  Weight (lbs) 163 lb 9.3 oz 164 lb 14.5 oz 184 lb  Weight (kg) 74.2 kg 74.8 kg 83.462 kg      Telemetry  Overnight telemetry shows sinus tachycardia, which I personally reviewed.    Physical Exam   General: Elderly man in NAD Head: Atraumatic, normal size  Eyes: PEERLA, EOMI, MM  Neck: Supple, no JVD Endocrine: No thryomegaly Cardiac: Tachycardia, 1/6 harsh SEM at RUSB-carotid  Lungs: Clear to auscultation bilaterally, no wheezing, rhonchi or rales  Abd: Soft, nontender, no hepatomegaly, no suprapubic tenderness Ext: No edema. R radial catheter access with unchanged mild ecchymosis now receeding without edema. Normal Allen's and reverse Barbeau's test today. R femoral cath access site with mild ecchymoses, without edema. PT and DP pulses present, without bruit or tenderness. Musculoskeletal: No deformities, BUE and BLE strength normal and equal Skin: R anterior chemotherapy port without surrounding erythema, fluctuance or edema. Bandage in place without drainage. Otherwise, skin is warm and dry, no rashes   Neuro: Alert and oriented to person, place, time, and situation,  CNII-XII grossly intact, no focal deficits  Psych: Normal mood with flat affect, engaged in conversation  Labs    High Sensitivity Troponin:   Recent Labs  Lab 09/07/22 1225 09/07/22 1639  TROPONINIHS 3,532* 7,318*     Chemistry Recent Labs  Lab 09/07/22 1111 09/07/22 1111 09/07/22 1225 09/07/22 1324 09/08/22 0020 09/09/22 0740  NA 136  --  136 136 134* 129*  K 4.3  --  4.4 4.0 4.4 3.8  CL 104  --  103 104 105 99  CO2 24  --  24  --  20* 17*  GLUCOSE 160*  --  159* 150* 135* 113*  BUN 24*  --  19 20 24* 23  CREATININE 0.88   < > 0.94 0.70 0.92 1.00  CALCIUM 9.1  --  9.0  --  8.4* 8.4*  PROT 6.8  --   --   --   --   --   ALBUMIN 3.7  --   --   --   --   --   AST 39  --   --   --   --   --   ALT 54*  --   --   --   --   --   ALKPHOS 160*  --   --   --   --   --   BILITOT 0.6  --   --   --   --   --   GFRNONAA >60  --  >60  --  >60 >60  ANIONGAP 8  --  9  --  9 13   < > = values in this interval not displayed.    Lipids  Recent Labs  Lab 09/07/22 1323  CHOL 137  TRIG 55  HDL 62  LDLCALC 64  CHOLHDL 2.2    Hematology Recent Labs  Lab 09/08/22 0020 09/09/22 0740 09/09/22 0932  WBC 8.1 4.3 3.8*  RBC 2.55* 2.31* 2.34*  HGB 8.8* 8.2* 8.3*  HCT 25.6* 22.9* 23.4*  MCV 100.4* 99.1 100.0  MCH 34.5* 35.5* 35.5*  MCHC 34.4 35.8 35.5  RDW 13.4 13.7 13.7  PLT 270 183 182     Thyroid No results for input(s): "TSH", "FREET4" in the last 168 hours.  BNPNo results for input(s): "BNP", "PROBNP" in the last 168 hours.  DDimer No results for input(s): "DDIMER" in the last 168 hours.   Radiology    DG Chest Port 1 View  Result Date: 09/09/2022 CLINICAL DATA:  Tachycardia EXAM: PORTABLE CHEST 1 VIEW COMPARISON:  CXR 08/22/22 FINDINGS: No pleural effusion. No pneumothorax. Right chest port with tip near the cavoatrial junction. Compared to prior exam there are new patchy airspace opacities at the bilateral lung bases, which could represent atelectasis or infection. No  radiographically apparent displaced rib fractures. Visualized upper abdomen is unremarkable. IMPRESSION: Compared to prior exam  there are new patchy airspace opacities at the bilateral lung bases, which could represent atelectasis or infection. Electronically Signed   By: Marin Roberts M.D.   On: 09/09/2022 09:55   ECHOCARDIOGRAM COMPLETE  Result Date: 09/08/2022    ECHOCARDIOGRAM REPORT   Patient Name:   NORMA MINETTE Date of Exam: 09/08/2022 Medical Rec #:  [id number]       Height:       69.0 in Accession #:    [id number]      Weight:       164.9 lb Date of Birth:  December 04, 1945       BSA:          1.903 m Patient Age:    31 years        BP:           110/65 mmHg Patient Gender: M               HR:           67 bpm. Exam Location:  Inpatient Procedure: 2D Echo, 3D Echo, Cardiac Doppler, Color Doppler and Intracardiac            Opacification Agent Indications:    Acute MI  History:        Patient has prior history of Echocardiogram examinations, most                 recent 10/22/2020. Carotid Disease, Signs/Symptoms:Murmur and                 Chest Pain; Risk Factors:Dyslipidemia. Hx of cancer,                 chemotherapy.  Sonographer:    Eartha Inch Referring Phys: [id number] Early Osmond  Sonographer Comments: Image acquisition challenging due to patient body habitus and Image acquisition challenging due to respiratory motion. IMPRESSIONS  1. Left ventricular ejection fraction, by estimation, is 65 to 70%. The left ventricle has normal function. The left ventricle demonstrates regional wall motion abnormalities (see scoring diagram/findings for description). There is moderate concentric left ventricular hypertrophy. Left ventricular diastolic parameters are indeterminate.  2. Right ventricular systolic function is normal. The right ventricular size is moderately enlarged. There is normal pulmonary artery systolic pressure.  3. Right atrial size was mildly dilated.  4. The mitral valve is degenerative. No  evidence of mitral valve regurgitation. No evidence of mitral stenosis. The mean mitral valve gradient is 3.0 mmHg with average heart rate of 66 bpm. Severe mitral annular calcification.  5. Mixed valve disease. Central aortic regurgitation, normal LV stroke volume index. The aortic valve is calcified. Aortic valve regurgitation is mild to moderate. Mild aortic valve stenosis. Aortic valve mean gradient measures 14.0 mmHg.  6. The inferior vena cava is normal in size with greater than 50% respiratory variability, suggesting right atrial pressure of 3 mmHg. Comparison(s): Prior images reviewed side by side. Similar aortic findings. LV wall motion comparison is difficult- WMA seen best on contrast images, prior contrast images not performed. FINDINGS  Left Ventricle: Left ventricular ejection fraction, by estimation, is 65 to 70%. The left ventricle has normal function. The left ventricle demonstrates regional wall motion abnormalities. The left ventricular internal cavity size was normal in size. There is moderate concentric left ventricular hypertrophy. Left ventricular diastolic parameters are indeterminate.  LV Wall Scoring: The mid and distal anterior septum and mid inferoseptal segment are hypokinetic. Right Ventricle: The right ventricular size is moderately enlarged. No increase in  right ventricular wall thickness. Right ventricular systolic function is normal. There is normal pulmonary artery systolic pressure. The tricuspid regurgitant velocity is 2.00 m/s, and with an assumed right atrial pressure of 3 mmHg, the estimated right ventricular systolic pressure is [id number] mmHg. Left Atrium: Left atrial size was normal in size. Right Atrium: Right atrial size was mildly dilated. Pericardium: There is no evidence of pericardial effusion. Mitral Valve: The mitral valve is degenerative in appearance. Severe mitral annular calcification. No evidence of mitral valve regurgitation. No evidence of mitral valve stenosis.  MV peak gradient, 9.5 mmHg. The mean mitral valve gradient is 3.0 mmHg with average heart rate of 66 bpm. Tricuspid Valve: The tricuspid valve is normal in structure. Tricuspid valve regurgitation is trivial. No evidence of tricuspid stenosis. Aortic Valve: Mixed valve disease. Central aortic regurgitation, normal LV stroke volume index. The aortic valve is calcified. Aortic valve regurgitation is mild to moderate. Aortic regurgitation PHT measures 651 msec. Mild aortic stenosis is present. Aortic valve mean gradient measures 14.0 mmHg. Aortic valve peak gradient measures 21.9 mmHg. Aortic valve area, by VTI measures 2.18 cm. Pulmonic Valve: The pulmonic valve was not well visualized. Pulmonic valve regurgitation is not visualized. Aorta: The aortic root and ascending aorta are structurally normal, with no evidence of dilitation. Venous: The inferior vena cava is normal in size with greater than 50% respiratory variability, suggesting right atrial pressure of 3 mmHg. IAS/Shunts: No atrial level shunt detected by color flow Doppler.  LEFT VENTRICLE PLAX 2D LVIDd:         4.10 cm      Diastology LVIDs:         2.70 cm      LV e' medial:    5.11 cm/s LV PW:         1.30 cm      LV E/e' medial:  19.4 LV IVS:        1.15 cm      LV e' lateral:   7.40 cm/s LVOT diam:     2.10 cm      LV E/e' lateral: 13.4 LV SV:         108 LV SV Index:   57 LVOT Area:     3.46 cm  LV Volumes (MOD) LV vol d, MOD A2C: 97.7 ml LV vol d, MOD A4C: 134.0 ml LV vol s, MOD A2C: 26.3 ml LV vol s, MOD A4C: 48.9 ml LV SV MOD A2C:     71.4 ml LV SV MOD A4C:     134.0 ml LV SV MOD BP:      77.7 ml RIGHT VENTRICLE             IVC RV S prime:     13.70 cm/s  IVC diam: 1.80 cm TAPSE (M-mode): 2.0 cm LEFT ATRIUM             Index        RIGHT ATRIUM           Index LA diam:        3.90 cm 2.05 cm/m   RA Area:     21.00 cm LA Vol (A2C):   65.0 ml 34.15 ml/m  RA Volume:   66.00 ml  34.68 ml/m LA Vol (A4C):   53.2 ml 27.95 ml/m LA Biplane Vol: 61.3  ml 32.21 ml/m  AORTIC VALVE AV Area (Vmax):    2.06 cm AV Area (Vmean):   1.85 cm AV Area (VTI):  2.18 cm AV Vmax:           234.00 cm/s AV Vmean:          181.000 cm/s AV VTI:            0.495 m AV Peak Grad:      21.9 mmHg AV Mean Grad:      14.0 mmHg LVOT Vmax:         139.00 cm/s LVOT Vmean:        96.800 cm/s LVOT VTI:          0.312 m LVOT/AV VTI ratio: 0.63 AI PHT:            651 msec  AORTA Ao Root diam: 3.20 cm Ao Asc diam:  3.60 cm MITRAL VALVE                TRICUSPID VALVE MV Area (PHT): 2.76 cm     TR Peak grad:   16.0 mmHg MV Area VTI:   2.22 cm     TR Vmax:        200.00 cm/s MV Peak grad:  9.5 mmHg MV Mean grad:  3.0 mmHg     SHUNTS MV Vmax:       1.54 m/s     Systemic VTI:  0.31 m MV Vmean:      88.1 cm/s    Systemic Diam: 2.10 cm MV Decel Time: 275 msec MV E velocity: 99.00 cm/s MV A velocity: 127.00 cm/s MV E/A ratio:  0.78 Rudean Haskell MD Electronically signed by Rudean Haskell MD Signature Date/Time: 09/08/2022/1:09:21 PM    Final     Cardiac Studies - Summarized   09/07/2021 ECHOCARDIOGRAM  IMPRESSIONS    1. Left ventricular ejection fraction, by estimation, is 65 to 70%. The left ventricle has normal function. The left ventricle demonstrates regional wall motion abnormalities:The mid and distal anterior septum and mid inferoseptal segment are  hypokinetic. There is moderate concentric left ventricular hypertrophy. Left ventricular diastolic parameters are indeterminate.   2. Right ventricular systolic function is normal. The right ventricular size is moderately enlarged. There is normal pulmonary artery systolic pressure.   3. Right atrial size was mildly dilated.   4. The mitral valve is degenerative. No evidence of mitral valve regurgitation. No evidence of mitral stenosis. The mean mitral valve gradient is 3.0 mmHg with average heart rate of 66 bpm. Severe mitral annular calcification.   5. Mixed valve disease. Central aortic regurgitation, normal LV  stroke volume index. The aortic valve is calcified. Aortic valve regurgitation is mild to moderate. Mild aortic valve stenosis. Aortic valve mean gradient measures 14.0 mmHg.   6. The inferior vena cava is normal in size with greater than 50% respiratory variability, suggesting right atrial pressure of 3 mmHg.   Cardiac cath-PCI RCA 09/07/2022: Diagnostic Dominance: Right     Intervention          09/23/2021 VAS US carotid duplex Summary:  Right Carotid: There is no evidence of stenosis in the right ICA.  Left Carotid: Velocities in the left ICA are consistent with a 1-39% stenosis.  Vertebrals: Right vertebral artery demonstrates antegrade flow. Left  vertebral artery demonstrates high resistant flow.  Subclavians: Normal flow hemodynamics were seen in bilateral subclavian arteries.   10/22/2020 ECHO complete LVEF 55-60%. Left ventricle with normal function. Moderate left ventricular hypertrophy. Grade II diastolic dysfunction. Elevated L atrial pressure R ventricle with normal function with mild enlargement.  Mitral valve abnormal with no evidence of  MV regurgitation or evidence of mitral stenosis Mild calcification of the aortic valve. Mild thickening. Trivial AV regurgitation  Assessment & Plan   Inferior STEMI ST elevation leads II, III, and aVF S/p LHC with distal occlusion of RCA with 4x DES Distal LM, ostial left circumflex, distal and ostial LAD stenoses not stented -TTE 3/12 with EF 65-75%, mild hypokinesis the mid inferiorseptal segment as well as the  mid and distal anterior septal segments; suspect anterior portion hypokinesis in setting of nonstented mid LAD stenosis -Will continue medical management without plans for repeat LHC and stent placement unless patient is symptomatic -Continue ASA and Brillinta therapy for at least 12 months given s/p 4 stents and high risk for occlusion -Continue on metoprolol tartrate 12.5 mg BID => convert to Toprol on discharge -Monitor  for Brillinta side effects including dyspnea, bradicardia, and PLT count -Recommend stress PET scan in ~1 month to evaluate left coronary system for ischemia based on the left main, LAD and LCx disease.  And monitor for anginal symptoms to evaluate need for additional revascularization  Fever Tachycardia Denies pain, appropriate PO intake, good oxygenation today Given high risk for infection from multiple sources; follow up work up below.  - CBC with differential to follow ANC - Blood cultures - U/A and urine culture - CXR - Procalcitonin - Once blood cultures are drawn, will start on broad spectrum antibiotics We have consulted PCCM to assist with the evaluation and management for now.  Defer treatment to PCCM and then eventually TRH based on the complexity of his noncardiac disease.  Metastatic Carcinoma Suspicious primary sites prostate vs lung cancer Metastasis to LN and bones Diagnosed 06/2022 Currently on the palliative chemotherapy/immunotherapy regimen (stated below) c/b pancytopenia now stable --Carboplatin for AUC of 5 on day 1 --Imfinzi 1500 Mg IV on day 1 --Etoposide 100 Mg/M2 on days 1, 2 and 3 with Neulasta support every 3 weeks status post 1 cycle.  Followed at [facility] with Dr. Julien Nordmann; recently completed second cycle with plans to follow up imaging on 09/17/2022 --Will need to discuss further palliative treatment given increased risk of bleeding on current DAPT regimen.  CTM platelets Due to his cancer therapies, concern for fever.  Need to assess his immunocompetence.  Plan is to cover with antibiotics until we have culture results.  Prostatic adenocarcinoma Hematuria Biopsy positive, diagnosed 07/2022 Followed by Dr. Junious Silk S/p  outpatient indwelling foley catheter removal with scant gross hematuria and BPH symptoms Stable Hgb today -Coude foley present; in place for at least 72 hrs per urology (with indwelling catheter concern for potential UTI as cause  for fever-urine cultures sent) -Will need discharge plans -Continue on Tamsulosin -Urology consulted; appreciate recommendations  Asymptomatic Aortic sclerosis Mild per 3/12 echo  Prediabetes A1c 6.4 -SSI  HLD Previously on Crestor 20 mg Now on Lipitor 80 mg daily, switch to Crestor 40 mg at discharge  Mobility Now undergoing cardiac rehabilitation  Disposition Given episode of fever will remain in the ICU for further work up  Discussed with Cardiology attending, Dr. Reece Agar, MD [facility] Program 09/10/2022, 10:09 AM  ATTENDING ATTESTATION  I have seen, examined and evaluated the patient this morning on rounds along with Dr. Simeon Craft (resident physician).  After reviewing all the available data and chart, we discussed the patients laboratory, study & physical findings as well as symptoms in detail.  I agree with her findings, examination as well as impression recommendations as per our  discussion.    Attending adjustments noted in italics.   Overall stable regarding standpoint.  Would not titrate beta-blocker further because of concerns with fever.  Based on how complicated his noncardiac comorbidities are, I have asked for our medicine colleagues PCCM to assist with evaluation and management.  Plan is starting antibiotic coverage while we are waiting on cultures.  Potential sources would be urinary most likely given his issues with Foley catheter requirement.  Also will check chest x-ray to assess for pneumonia and blood cultures.  No real change to cardiac medications.  We need to stabilize the noncardiac issues prior to discharge.  Otherwise he will be ready for discharge from a car standpoint soon.   Leonie Man, MD, MS Glenetta Hew, M.D., M.S. Interventional Cardiologist  [place]  Pager # [phone] Phone # [phone] [address] [place], [place] [address]    For questions or  updates, please contact [place] Please consult [email or web address] for contact info under

## 2022-09-10 ENCOUNTER — Ambulatory Visit (HOSPITAL_COMMUNITY): Payer: Medicare Other

## 2022-09-10 ENCOUNTER — Inpatient Hospital Stay (HOSPITAL_COMMUNITY): Payer: Medicare Other

## 2022-09-10 DIAGNOSIS — R7881 Bacteremia: Secondary | ICD-10-CM | POA: Diagnosis not present

## 2022-09-10 DIAGNOSIS — T80212A Local infection due to central venous catheter, initial encounter: Secondary | ICD-10-CM

## 2022-09-10 DIAGNOSIS — R509 Fever, unspecified: Secondary | ICD-10-CM | POA: Diagnosis not present

## 2022-09-10 DIAGNOSIS — Z955 Presence of coronary angioplasty implant and graft: Secondary | ICD-10-CM

## 2022-09-10 DIAGNOSIS — Z960 Presence of urogenital implants: Secondary | ICD-10-CM | POA: Diagnosis not present

## 2022-09-10 DIAGNOSIS — B952 Enterococcus as the cause of diseases classified elsewhere: Secondary | ICD-10-CM

## 2022-09-10 DIAGNOSIS — I25110 Atherosclerotic heart disease of native coronary artery with unstable angina pectoris: Secondary | ICD-10-CM | POA: Diagnosis not present

## 2022-09-10 DIAGNOSIS — I2119 ST elevation (STEMI) myocardial infarction involving other coronary artery of inferior wall: Secondary | ICD-10-CM | POA: Diagnosis not present

## 2022-09-10 DIAGNOSIS — I2510 Atherosclerotic heart disease of native coronary artery without angina pectoris: Secondary | ICD-10-CM

## 2022-09-10 DIAGNOSIS — I679 Cerebrovascular disease, unspecified: Secondary | ICD-10-CM | POA: Diagnosis not present

## 2022-09-10 HISTORY — PX: IR REMOVAL TUN ACCESS W/ PORT W/O FL MOD SED: IMG2290

## 2022-09-10 LAB — GLUCOSE, CAPILLARY
Glucose-Capillary: 195 mg/dL — ABNORMAL HIGH (ref 70–99)
Glucose-Capillary: 152 mg/dL — ABNORMAL HIGH (ref 70–99)
Glucose-Capillary: 180 mg/dL — ABNORMAL HIGH (ref 70–99)

## 2022-09-10 LAB — CBC WITH DIFFERENTIAL/PLATELET
Abs Immature Granulocytes: 0 10*3/uL (ref 0.00–0.07)
Basophils Absolute: 0.1 10*3/uL (ref 0.0–0.1)
Basophils Relative: 4 %
Eosinophils Absolute: 0 10*3/uL (ref 0.0–0.5)
Eosinophils Relative: 0 %
HCT: 23.1 % — ABNORMAL LOW (ref 39.0–52.0)
Hemoglobin: 8.2 g/dL — ABNORMAL LOW (ref 13.0–17.0)
Lymphocytes Relative: 14 %
Lymphs Abs: 0.4 10*3/uL — ABNORMAL LOW (ref 0.7–4.0)
MCH: 35 pg — ABNORMAL HIGH (ref 26.0–34.0)
MCHC: 35.5 g/dL (ref 30.0–36.0)
MCV: 98.7 fL (ref 80.0–100.0)
Monocytes Absolute: 0.1 10*3/uL (ref 0.1–1.0)
Monocytes Relative: 3 %
Neutro Abs: 2.4 10*3/uL (ref 1.7–7.7)
Neutrophils Relative %: 79 %
Platelets: 124 10*3/uL — ABNORMAL LOW (ref 150–400)
RBC: 2.34 MIL/uL — ABNORMAL LOW (ref 4.22–5.81)
RDW: 13.6 % (ref 11.5–15.5)
WBC: 3 10*3/uL — ABNORMAL LOW (ref 4.0–10.5)
nRBC: 0 % (ref 0.0–0.2)
nRBC: 0 /100 WBC

## 2022-09-10 LAB — BASIC METABOLIC PANEL
Anion gap: 8 (ref 5–15)
BUN: 19 mg/dL (ref 8–23)
CO2: 20 mmol/L — ABNORMAL LOW (ref 22–32)
Calcium: 8.2 mg/dL — ABNORMAL LOW (ref 8.9–10.3)
Chloride: 100 mmol/L (ref 98–111)
Creatinine, Ser: 1 mg/dL (ref 0.61–1.24)
GFR, Estimated: >60 mL/min (ref >60)
Glucose, Bld: 120 mg/dL — ABNORMAL HIGH (ref 70–99)
Potassium: 3.6 mmol/L (ref 3.5–5.1)
Sodium: 128 mmol/L — ABNORMAL LOW (ref 135–145)

## 2022-09-10 MED ORDER — LIDOCAINE-EPINEPHRINE 1 %-1:100000 IJ SOLN
INTRAMUSCULAR | Status: AC
  Administered 2022-09-10: 10 mL
  Filled 2022-09-10: qty 1

## 2022-09-10 MED ORDER — SODIUM CHLORIDE 0.9 % IV SOLN
2.0000 g | INTRAVENOUS | Status: DC
  Administered 2022-09-10 – 2022-09-15 (×34): 2 g via INTRAVENOUS
  Filled 2022-09-10 (×42): qty 2000

## 2022-09-10 MED ORDER — MIDAZOLAM HCL 2 MG/2ML IJ SOLN
INTRAMUSCULAR | Status: AC | PRN
  Administered 2022-09-10 (×2): .5 mg via INTRAVENOUS
  Administered 2022-09-10: 1 mg via INTRAVENOUS

## 2022-09-10 MED ORDER — ROSUVASTATIN CALCIUM 20 MG PO TABS
40.0000 mg | ORAL_TABLET | Freq: Every day | ORAL | Status: DC
  Administered 2022-09-10 – 2022-09-15 (×6): 40 mg via ORAL
  Filled 2022-09-10 (×6): qty 2

## 2022-09-10 MED ORDER — SODIUM CHLORIDE 0.9 % IV SOLN
INTRAVENOUS | Status: AC | PRN
  Administered 2022-09-10: 10 mL/h via INTRAVENOUS

## 2022-09-10 MED ORDER — MIDAZOLAM HCL 2 MG/2ML IJ SOLN
INTRAMUSCULAR | Status: AC
  Filled 2022-09-10: qty 2

## 2022-09-10 NOTE — Consult Note (Signed)
[facility]    Date of Admission:  09/07/2022     Total days of antibiotics 2               Reason for Consult: Enterococcus Bacteremia  Referring Provider: [employer] Primary Care Provider: Ria Bush, MD   ASSESSMENT:  Larry Kent is a 78 y/o male with prostate cancer and neuroendocrine carcinoma admitted with chest pain and found to be having NSTEMI and s/p catheterization with placement of 4 stents. Course complicated by development of fever and now Enterococcus bacteremia. Source of infection is unclear although given recent Urological procedures likely translocation of bacteria from the GI system. Urine itself is clear with no evidence of UTI. It is unlikely that he has endocarditis given the short duration of infection although there is concern about his implanted port in the right chest for possible seeding with infection. Continue current dose of ampicillin pending sensitivities. Ideally will need to remove port and will discuss with Oncology. Repeat blood cultures to check for clearance of bacteremia. Plan of care discussed with Larry Kent. Remaining medical and supportive care per Primary Team.   PLAN:  Continue current dose of ampicillin. Repeat blood cultures for clearance of bacteremia and repeat after line removal.  Will need remove port with concern for possible seeding. Remaining medical and supportive care per Primary Team.    Principal Problem:   Enterococcal bacteremia Active Problems:   Hyperlipidemia with target LDL less than 70   Prediabetes   Cerebrovascular disease   Indwelling urinary catheter present   Acute ST elevation myocardial infarction (STEMI) of inferior wall (HCC)   Coronary artery disease involving native coronary artery of native heart with unstable angina pectoris (HCC)   Left main coronary artery disease   Hematuria   Fever   S/P coronary artery stent placement    aspirin  81 mg Oral Daily    Chlorhexidine Gluconate Cloth  6 each Topical Daily   insulin aspart  0-5 Units Subcutaneous QHS   insulin aspart  0-9 Units Subcutaneous TID WC   melatonin  3 mg Oral QHS   metoprolol tartrate  12.5 mg Oral BID   rosuvastatin  40 mg Oral Daily   sodium chloride flush  3 mL Intravenous Q12H   tamsulosin  0.4 mg Oral BID   Thrombi-Pad  1 each Topical Once   ticagrelor  90 mg Oral BID     HPI: Larry Kent is a 77 y.o. male with previous medical history metastatic prostate cancer with neuroendocrine carcinoma of the lung presenting from the [facility] with chest pain and mild shortness of breath.   Larry Kent began having chest pain and shortness of breath on the morning of 09/07/22 without exertion with EKG being consistent with inferior STEMI and was transferred to [facility] [address] Cardiac cath performed with placement of 4 stents. Followed by Urology and recently passed a voiding trial. Seen by Urology with placement of Coude catheter on 3/12. Course was complicated by development of fever on 09/09/22. WBC count was 3.0 and blood cultures drawn. Initially received a dose of ceftriaxone. Blood cultures are positive for Enterococcus faecalis.   Larry Kent continues to have fevers with most recent temperature of 101.3 F and antibiotics have been changed to ampicillin. He does have a Port-A-Cath located in his right chest. Chest x-ray from 09/10/22 with new patchy airspace opacities possibly representing atelectasis or possible infection.   Review of Systems: Review of Systems  Constitutional:  Negative for chills, fever and weight loss.  Respiratory:  Negative for cough, shortness of breath and wheezing.   Cardiovascular:  Negative for chest pain and leg swelling.  Gastrointestinal:  Negative for abdominal pain, constipation, diarrhea, nausea and vomiting.  Skin:  Negative for rash.     Past Medical History:  Diagnosis Date   Allergy    Arthritis    both knees    BCC (basal cell  carcinoma), arm, right 09/2019   MOHS ([place])   BCC (basal cell carcinoma), face 2014   L preauricular s/p MOHS   BCC (basal cell carcinoma), face 02/2018   L upper lip   BPH (benign prostatic hypertrophy)    Dr. Junious Silk @ [facility]   Carotid stenosis 123XX123   RICA 123456, LICA 123456, L vertebral occlusion, rpt 1 yr   Cataract    removed both eyes    FHx: colon cancer    FHx: prostate cancer    Heart murmur    mild aortic stenosis    History of kidney stones    Hyperlipidemia    borderline- on rosuvastatin now normal    Occlusion of right vertebral artery 05/2015    Social History   Tobacco Use   Smoking status: Never   Smokeless tobacco: Never  Vaping Use   Vaping Use: Never used  Substance Use Topics   Alcohol use: Not Currently    Comment: rare   Drug use: No    Family History  Problem Relation Age of Onset   Cancer Father 58       prostate   Coronary artery disease Father 11       MI   Prostate cancer Father    Cancer Paternal Grandfather 31       prostate   Prostate cancer Paternal Grandfather    Stroke Mother    Hypertension Mother    Cancer Mother 27       colon   Colon cancer Mother    Coronary artery disease Maternal Uncle    Cancer Sister 39       breast   Breast cancer Sister    Colon cancer Maternal Grandmother    Diabetes Neg Hx    Colon polyps Neg Hx    Esophageal cancer Neg Hx    Rectal cancer Neg Hx    Stomach cancer Neg Hx     No Known Allergies  OBJECTIVE: Blood pressure 109/73, pulse 88, temperature (!) 101.3 F (38.5 C), temperature source Oral, resp. rate 15, height '5\' 9"'$  (1.753 m), weight 74.2 kg, SpO2 98 %.  Physical Exam Constitutional:      General: He is not in acute distress.    Appearance: He is well-developed.  Cardiovascular:     Rate and Rhythm: Normal rate and regular rhythm.     Heart sounds: Murmur heard.  Pulmonary:     Effort: Pulmonary effort is normal.     Breath sounds: Normal breath sounds.   Skin:    General: Skin is warm and dry.  Neurological:     Mental Status: He is alert and oriented to person, place, and time.  Psychiatric:        Behavior: Behavior normal.        Thought Content: Thought content normal.        Judgment: Judgment normal.     Lab Results Lab Results  Component Value Date   WBC 3.0 (L) 09/10/2022   HGB 8.2 (L) 09/10/2022  HCT 23.1 (L) 09/10/2022   MCV 98.7 09/10/2022   PLT 124 (L) 09/10/2022    Lab Results  Component Value Date   CREATININE 1.00 09/10/2022   BUN 19 09/10/2022   NA 128 (L) 09/10/2022   K 3.6 09/10/2022   CL 100 09/10/2022   CO2 20 (L) 09/10/2022    Lab Results  Component Value Date   ALT 54 (H) 09/07/2022   AST 39 09/07/2022   ALKPHOS 160 (H) 09/07/2022   BILITOT 0.6 09/07/2022     Microbiology: Recent Results (from the past 240 hour(s))  Urine Culture (for pregnant, neutropenic or urologic patients or patients with an indwelling urinary catheter)     Status: Abnormal (Preliminary result)   Collection Time: 09/09/22  9:10 AM   Specimen: Urine, Catheterized  Result Value Ref Range Status   Specimen Description URINE, CATHETERIZED  Final   Special Requests Immunocompromised  Final   Culture (A)  Final    >=100,000 COLONIES/mL ENTEROCOCCUS FAECALIS SUSCEPTIBILITIES TO FOLLOW Performed at [facility], [address] [place], [place] [address]    Report Status PENDING  Incomplete  Culture, blood (Routine X 2) w Reflex to ID Panel     Status: Abnormal (Preliminary result)   Collection Time: 09/09/22  9:32 AM   Specimen: BLOOD RIGHT ARM  Result Value Ref Range Status   Specimen Description BLOOD RIGHT ARM  Final   Special Requests   Final    BOTTLES DRAWN AEROBIC AND ANAEROBIC Blood Culture adequate volume   Culture  Setup Time   Final    GRAM POSITIVE COCCI IN CHAINS IN BOTH AEROBIC AND ANAEROBIC BOTTLES Organism ID to follow CRITICAL RESULT CALLED TO, READ BACK BY AND VERIFIED WITH: T  RUDISILL,PHARMD'@2352'$  09/09/22 [place]    Culture (A)  Final    ENTEROCOCCUS FAECALIS SUSCEPTIBILITIES TO FOLLOW Performed at [facility], [place] [address] [place], [place] [address]    Report Status PENDING  Incomplete  Blood Culture ID Panel (Reflexed)     Status: Abnormal   Collection Time: 09/09/22  9:32 AM  Result Value Ref Range Status   Enterococcus faecalis DETECTED (A) NOT DETECTED Final    Comment: CRITICAL RESULT CALLED TO, READ BACK BY AND VERIFIED WITH: T RUDISILL,PHARMD'@2352'$  09/09/22 [place]    Enterococcus Faecium NOT DETECTED NOT DETECTED Final   Listeria monocytogenes NOT DETECTED NOT DETECTED Final   Staphylococcus species NOT DETECTED NOT DETECTED Final   Staphylococcus aureus (BCID) NOT DETECTED NOT DETECTED Final   Staphylococcus epidermidis NOT DETECTED NOT DETECTED Final   Staphylococcus lugdunensis NOT DETECTED NOT DETECTED Final   Streptococcus species NOT DETECTED NOT DETECTED Final   Streptococcus agalactiae NOT DETECTED NOT DETECTED Final   Streptococcus pneumoniae NOT DETECTED NOT DETECTED Final   Streptococcus pyogenes NOT DETECTED NOT DETECTED Final   A.calcoaceticus-baumannii NOT DETECTED NOT DETECTED Final   Bacteroides fragilis NOT DETECTED NOT DETECTED Final   Enterobacterales NOT DETECTED NOT DETECTED Final   Enterobacter cloacae complex NOT DETECTED NOT DETECTED Final   Escherichia coli NOT DETECTED NOT DETECTED Final   Klebsiella aerogenes NOT DETECTED NOT DETECTED Final   Klebsiella oxytoca NOT DETECTED NOT DETECTED Final   Klebsiella pneumoniae NOT DETECTED NOT DETECTED Final   Proteus species NOT DETECTED NOT DETECTED Final   Salmonella species NOT DETECTED NOT DETECTED Final   Serratia marcescens NOT DETECTED NOT DETECTED Final   Haemophilus influenzae NOT DETECTED NOT DETECTED Final   Neisseria meningitidis NOT DETECTED NOT DETECTED Final  Pseudomonas aeruginosa NOT DETECTED NOT DETECTED Final   Stenotrophomonas maltophilia NOT DETECTED  NOT DETECTED Final   Candida albicans NOT DETECTED NOT DETECTED Final   Candida auris NOT DETECTED NOT DETECTED Final   Candida glabrata NOT DETECTED NOT DETECTED Final   Candida krusei NOT DETECTED NOT DETECTED Final   Candida parapsilosis NOT DETECTED NOT DETECTED Final   Candida tropicalis NOT DETECTED NOT DETECTED Final   Cryptococcus neoformans/gattii NOT DETECTED NOT DETECTED Final   Vancomycin resistance NOT DETECTED NOT DETECTED Final    Comment: Performed at [facility], [place] [address] [place], [place] [address]  Culture, blood (Routine X 2) w Reflex to ID Panel     Status: None (Preliminary result)   Collection Time: 09/09/22  9:34 AM   Specimen: BLOOD RIGHT ARM  Result Value Ref Range Status   Specimen Description BLOOD RIGHT ARM  Final   Special Requests   Final    BOTTLES DRAWN AEROBIC AND ANAEROBIC Blood Culture adequate volume   Culture  Setup Time   Final    GRAM POSITIVE COCCI IN BOTH AEROBIC AND ANAEROBIC BOTTLES CRITICAL VALUE NOTED.  VALUE IS CONSISTENT WITH PREVIOUSLY REPORTED AND CALLED VALUE. Performed at [facility], [place] [address], [place], [place] [address]    Culture GRAM POSITIVE COCCI  Final   Report Status PENDING  Incomplete  Culture, blood (Routine X 2) w Reflex to ID Panel     Status: None (Preliminary result)   Collection Time: 09/10/22  8:15 AM   Specimen: BLOOD  Result Value Ref Range Status   Specimen Description BLOOD RIGHT ANTECUBITAL  Final   Special Requests   Final    BOTTLES DRAWN AEROBIC AND ANAEROBIC Blood Culture adequate volume Performed at [facility], [place] [address], [place], [place] [address]    Culture PENDING  Incomplete   Report Status PENDING  Incomplete     Terri Piedra, NP [facility]  09/10/2022  12:57 PM

## 2022-09-10 NOTE — TOC CM/SW Note (Cosign Needed)
..   Transition of Care [facility]) Screening Note   Patient Details  Name: Larry Kent Date of Birth: April 25, 1946   Transition of Care [facility]) CM/SW Contact:    Erenest Rasher, RN Phone Number: [phone] 09/10/2022, 12:36 PM    Transition of Care Department [facility]) has reviewed patient and no TOC needs have been identified at this time. Will continue to follow for dc needs. Will PT/OT recommendations for home.   The patient is currently admitted and upon discharge could be taking Brilinta 90 mg.   The current 30 day co-pay is $47.00.

## 2022-09-10 NOTE — Progress Notes (Signed)
PHARMACY - PHYSICIAN COMMUNICATION CRITICAL VALUE ALERT - BLOOD CULTURE IDENTIFICATION (BCID)  Larry Kent is an 77 y.o. male who presented to [facility] on 09/07/2022 with a chief complaint of complained of having chest tightness and shortness of breath without exertion  Assessment:  Enterococcus faecalis 4 of 4  Name of physician (or Provider) Contacted: [facility]  Current antibiotics: Ceftriaxone  Changes to prescribed antibiotics recommended: Discontinue ceftriaxone and start ampicillin Recommendations accepted by provider  Results for orders placed or performed during the hospital encounter of 09/07/22  Blood Culture ID Panel (Reflexed) (Collected: 09/09/2022  9:32 AM)  Result Value Ref Range   Enterococcus faecalis DETECTED (A) NOT DETECTED   Enterococcus Faecium NOT DETECTED NOT DETECTED   Listeria monocytogenes NOT DETECTED NOT DETECTED   Staphylococcus species NOT DETECTED NOT DETECTED   Staphylococcus aureus (BCID) NOT DETECTED NOT DETECTED   Staphylococcus epidermidis NOT DETECTED NOT DETECTED   Staphylococcus lugdunensis NOT DETECTED NOT DETECTED   Streptococcus species NOT DETECTED NOT DETECTED   Streptococcus agalactiae NOT DETECTED NOT DETECTED   Streptococcus pneumoniae NOT DETECTED NOT DETECTED   Streptococcus pyogenes NOT DETECTED NOT DETECTED   A.calcoaceticus-baumannii NOT DETECTED NOT DETECTED   Bacteroides fragilis NOT DETECTED NOT DETECTED   Enterobacterales NOT DETECTED NOT DETECTED   Enterobacter cloacae complex NOT DETECTED NOT DETECTED   Escherichia coli NOT DETECTED NOT DETECTED   Klebsiella aerogenes NOT DETECTED NOT DETECTED   Klebsiella oxytoca NOT DETECTED NOT DETECTED   Klebsiella pneumoniae NOT DETECTED NOT DETECTED   Proteus species NOT DETECTED NOT DETECTED   Salmonella species NOT DETECTED NOT DETECTED   Serratia marcescens NOT DETECTED NOT DETECTED   Haemophilus influenzae NOT DETECTED NOT DETECTED   Neisseria meningitidis NOT DETECTED NOT  DETECTED   Pseudomonas aeruginosa NOT DETECTED NOT DETECTED   Stenotrophomonas maltophilia NOT DETECTED NOT DETECTED   Candida albicans NOT DETECTED NOT DETECTED   Candida auris NOT DETECTED NOT DETECTED   Candida glabrata NOT DETECTED NOT DETECTED   Candida krusei NOT DETECTED NOT DETECTED   Candida parapsilosis NOT DETECTED NOT DETECTED   Candida tropicalis NOT DETECTED NOT DETECTED   Cryptococcus neoformans/gattii NOT DETECTED NOT DETECTED   Vancomycin resistance NOT DETECTED NOT DETECTED    Nicole Kindred L Ilham Roughton 09/10/2022  12:07 AM

## 2022-09-10 NOTE — Progress Notes (Addendum)
CARDIOLOGY ROUNDING PROGRESS NOTE  Patient Name: Larry Kent Date of Encounter: 09/10/2022  [facility] Cardiologist: Kathlyn Sacramento, MD   Patient Profile     77 y.o. male with a PHX of metastatic prostate cancer, lung nodules concerning for neuroendocrine carcinoma on biopsy undergoing palliative chemo/immunotherapy, carotid disease s/p carotid endarterectomy in 2021 for severe, asymptomatic stenosis, HLD presenting from oncologist office to [facility] ED for chest pain evaluation, found to have an inferior MI  on EKG, subsequently transferred to [facility] s/p cardiac catheterization with stent placement in the RCA, with febrile illness of post PCI day 2, now with enterococcus bacteremia   Principal Problem:   Enterococcal bacteremia Active Problems:   Acute ST elevation myocardial infarction (STEMI) of inferior wall ([place])   Coronary artery disease involving native coronary artery of native heart with unstable angina pectoris ([place])   Left main coronary artery disease   Hyperlipidemia with target LDL less than 70   Cerebrovascular disease   Prediabetes   Indwelling urinary catheter present   Hematuria   Fever   S/P coronary artery stent placement   Port or reservoir infection   Subjective   Chief Complaint: Chest tightness and SOB with exertion  found to have inferior STEMI  O/N: Febrile over past 24 hours, normotensive, with improvement in tachycardia. Started on CTX for suspected UTI; however, blood cultures growing Enterococcus fecalis in 4/4 bottles. Patient was experiencing generalized malaise and chills over the past 24 hours, but expresses that this has improved today. Denies chest pain, shob, n/v, has had a good appetite. He continues deny to pain at R chest port or LHC insertion sites or suprapubic region.    ROS:  All other ROS reviewed and negative. Pertinent positives noted in the HPI.     Inpatient Medications  Scheduled Meds:  aspirin  81 mg Oral Daily    Chlorhexidine Gluconate Cloth  6 each Topical Daily   insulin aspart  0-5 Units Subcutaneous QHS   insulin aspart  0-9 Units Subcutaneous TID WC   melatonin  3 mg Oral QHS   metoprolol tartrate  12.5 mg Oral BID   rosuvastatin  40 mg Oral Daily   sodium chloride flush  3 mL Intravenous Q12H   tamsulosin  0.4 mg Oral BID   Thrombi-Pad  1 each Topical Once   ticagrelor  90 mg Oral BID   Continuous Infusions:  sodium chloride Stopped (09/10/22 0739)   sodium chloride     ampicillin (OMNIPEN) IV 2 g (09/10/22 1230)   PRN Meds: sodium chloride, acetaminophen, fentaNYL (SUBLIMAZE) injection, ondansetron (ZOFRAN) IV, sodium chloride flush   Vital Signs    Vitals:   09/10/22 1000 09/10/22 1100 09/10/22 1200 09/10/22 1300  BP: 110/66 109/73 107/71 93/66  Pulse:  88    Resp: 20 15 (!) 23 15  Temp:  (!) 101.3 F (38.5 C)    TempSrc:  Oral    SpO2: 94% 98% 96% 96%  Weight:      Height:        Intake/Output Summary (Last 24 hours) at 09/10/2022 1451 Last data filed at 09/10/2022 0800 Gross per 24 hour  Intake 712.21 ml  Output 1360 ml  Net -647.79 ml      09/09/2022    6:00 AM 09/08/2022    6:05 AM 09/07/2022   12:16 PM  Last 3 Weights  Weight (lbs) 163 lb 9.3 oz 164 lb 14.5 oz 184 lb  Weight (kg) 74.2 kg 74.8 kg 83.462  kg      Telemetry  Overnight telemetry shows NSR which I personally reviewed.   Physical Exam   General: Elderly man in NAD Head: Atraumatic, normal size  Eyes: PEERLA, EOMI, MM  Neck: Supple, no JVD Endocrine: No thryomegaly Cardiac: RRR, 1/6 harsh SEM at RUSB-carotid  Lungs: Clear to auscultation bilaterally, no wheezing, rhonchi or rales  Abd: Soft, nontender, no hepatomegaly, no suprapubic tenderness Ext: No edema. R radial catheter access with resolving mild ecchymosis without edema. Normal Allen's and reverse Barbeau's test today. R femoral cath access site with unchanged mild ecchymoses, without edema. PT and DP pulses present, without bruit or  tenderness. Musculoskeletal: No deformities, BUE and BLE strength normal and equal Skin: R anterior chemotherapy port without surrounding erythema, fluctuance or edema. Bandage in place without drainage. Otherwise, skin is warm and dry, no rashes   Neuro: Alert and oriented to person, place, time, and situation, responding appropriately , no focal neurological deficits Psych: Normal mood with flat affect, engaged in conversation GU: no suprapubic tenderness, foley cath in place  Labs    High Sensitivity Troponin:   Recent Labs  Lab 09/07/22 1225 09/07/22 1639  TROPONINIHS 3,532* 7,318*     Chemistry Recent Labs  Lab 09/07/22 1111 09/07/22 1225 09/08/22 0020 09/09/22 0740 09/10/22 0815  NA 136   < > 134* 129* 128*  K 4.3   < > 4.4 3.8 3.6  CL 104   < > 105 99 100  CO2 24   < > 20* 17* 20*  GLUCOSE 160*   < > 135* 113* 120*  BUN 24*   < > 24* 23 19  CREATININE 0.88   < > 0.92 1.00 1.00  CALCIUM 9.1   < > 8.4* 8.4* 8.2*  PROT 6.8  --   --   --   --   ALBUMIN 3.7  --   --   --   --   AST 39  --   --   --   --   ALT 54*  --   --   --   --   ALKPHOS 160*  --   --   --   --   BILITOT 0.6  --   --   --   --   GFRNONAA >60   < > >60 >60 >60  ANIONGAP 8   < > '9 13 8   '$ < > = values in this interval not displayed.    Lipids  Recent Labs  Lab 09/07/22 1323  CHOL 137  TRIG 55  HDL 62  LDLCALC 64  CHOLHDL 2.2    Hematology Recent Labs  Lab 09/09/22 0740 09/09/22 0932 09/10/22 0815  WBC 4.3 3.8* 3.0*  RBC 2.31* 2.34* 2.34*  HGB 8.2* 8.3* 8.2*  HCT 22.9* 23.4* 23.1*  MCV 99.1 100.0 98.7  MCH 35.5* 35.5* 35.0*  MCHC 35.8 35.5 35.5  RDW 13.7 13.7 13.6  PLT 183 182 124*     Thyroid No results for input(s): "TSH", "FREET4" in the last 168 hours.  BNPNo results for input(s): "BNP", "PROBNP" in the last 168 hours.  DDimer No results for input(s): "DDIMER" in the last 168 hours.   Microbiology: Culture: gram positive cocci BCID: Enterococcus faecalils - awaiting  sensitivities    Radiology    DG Chest Port 1 View  Result Date: 09/09/2022 CLINICAL DATA:  Tachycardia EXAM: PORTABLE CHEST 1 VIEW COMPARISON:  CXR 08/22/22 FINDINGS: No pleural effusion. No pneumothorax. Right chest  port with tip near the cavoatrial junction. Compared to prior exam there are new patchy airspace opacities at the bilateral lung bases, which could represent atelectasis or infection. No radiographically apparent displaced rib fractures. Visualized upper abdomen is unremarkable. IMPRESSION: Compared to prior exam there are new patchy airspace opacities at the bilateral lung bases, which could represent atelectasis or infection. Electronically Signed   By: Marin Roberts M.D.   On: 09/09/2022 09:55   Cardiac Studies - Summarized   09/07/2021 ECHOCARDIOGRAM  IMPRESSIONS   1. Left ventricular ejection fraction, by estimation, is 65 to 70%. The left ventricle has normal function. The left ventricle demonstrates regional wall motion abnormalities:The mid and distal anterior septum and mid inferoseptal segment are  hypokinetic. There is moderate concentric left ventricular hypertrophy. Left ventricular diastolic parameters are indeterminate.   2. Right ventricular systolic function is normal. The right ventricular size is moderately enlarged. There is normal pulmonary artery systolic pressure.   3. Right atrial size was mildly dilated.   4. The mitral valve is degenerative. No evidence of mitral valve regurgitation. No evidence of mitral stenosis. The mean mitral valve gradient is 3.0 mmHg with average heart rate of 66 bpm. Severe mitral annular calcification.   5. Mixed valve disease. Central aortic regurgitation, normal LV stroke volume index. The aortic valve is calcified. Aortic valve regurgitation is mild to moderate. Mild aortic valve stenosis. Aortic valve mean gradient measures 14.0 mmHg.   6. The inferior vena cava is normal in size with greater than 50% respiratory variability,  suggesting right atrial pressure of 3 mmHg.   Cardiac cath-PCI RCA 09/07/2022: Diagnostic Dominance: Right     Intervention          09/23/2021 VAS US carotid duplex Summary:  Right Carotid: There is no evidence of stenosis in the right ICA.  Left Carotid: Velocities in the left ICA are consistent with a 1-39% stenosis.  Vertebrals: Right vertebral artery demonstrates antegrade flow. Left  vertebral artery demonstrates high resistant flow.  Subclavians: Normal flow hemodynamics were seen in bilateral subclavian arteries.   10/22/2020 ECHO complete LVEF 55-60%. Left ventricle with normal function. Moderate left ventricular hypertrophy. Grade II diastolic dysfunction. Elevated L atrial pressure R ventricle with normal function with mild enlargement.  Mitral valve abnormal with no evidence of MV regurgitation or evidence of mitral stenosis Mild calcification of the aortic valve. Mild thickening. Trivial AV regurgitation  Assessment & Plan   Inferior STEMI ST elevation leads II, III, and aVF S/p LHC with distal occlusion of RCA with 4x DES Distal LM, ostial left circumflex, distal and ostial LAD stenoses not stented -TTE 3/12 with EF 65-75%, mild hypokinesis the mid inferiorseptal segment as well as the  mid and distal anterior septal segments; suspect anterior portion hypokinesis in setting of nonstented mid LAD stenosis -Continue medical management; no plans for repeat LHC and stent placement unless patient is symptomatic -Continue ASA and Brillinta therapy for at least 12 months given s/p 4 stents and high risk for occlusion -Monitor for Brillinta side effects including dyspnea, bradicardia, and PLT count -Continue on Metoprolol tartrate 12.5 mg BID -- monitor blood pressures in setting of bacteremia; reluctant to push beta-blocker further to avoid masking sepsis related tachycardia. -- will convert to Toprol on discharge -Recommend stress PET scan in ~1 month to evaluate left  coronary system for ischemia based on the left main, LAD and LCx disease.  And monitor for anginal symptoms to evaluate need for additional revascularization ==> anticipate this being  pushed back to allow for full treatment of bacteremia.  Enterococcus fecalis bacteremia Multiple sources  Concern of bacterial infection prior to hospitalization; low grade fever on admission Indwelling foley catheter prior to admission(new foley placed by Urology on 3/11) Chemotherapy port site S/p PCI with R and femoral cath site access WBC 3.0 today, awaiting differential CXR with patchy airspace opacities concerning for atelectasis. Negative procalcitonin Started on CTX on 3/13 ->switched to ampicillin 3/14 -Reflex Infectious disease consult -- Likely willl need chemo port removal and foley catheter exchange IR consulted for port removal based on infectious disease recommendation.  Will need time for IV antibiotics and then can have port replaced. -- Considering TEE, though low risk for endocarditisappreciate recommendations This would likely depend on repeat blood cultures.  If negative, would probably not needed based on low risk and relatively short time period from fever to antibiotics. -PCCM following; appreciate recommendations -Follow blood culture susceptibilities -F/u urine cultures pending  Thrombocytopenia Likely in the setting of acute illness CTM  Metastatic Carcinoma Suspicious primary sites prostate vs lung cancer Metastasis to LN and bones Diagnosed 06/2022 Currently on the palliative chemotherapy/immunotherapy regimen (stated below) c/b pancytopenia now stable --Carboplatin for AUC of 5 on day 1 --Imfinzi 1500 Mg IV on day 1 --Etoposide 100 Mg/M2 on days 1, 2 and 3 with Neulasta support every 3 weeks status post 1 cycle.  Followed at the [facility] with Dr. Julien Nordmann; recently completed second cycle with plans to follow up imaging on 09/17/2022 --Will need to discuss further  palliative treatment given increased risk of bleeding on current DAPT regimen.  -CTM platelets  Prostatic adenocarcinoma Biopsy positive, diagnosed 07/2022 Followed by Dr. Junious Silk S/p  outpatient indwelling foley catheter removal with scant gross hematuria and urinary symptoms -Coude foley present; in place for at least 72 hrs (3/15) per urology for urinary retention now with concern CAUTI -Will need discharge plans -Continue on Tamsulosin -Urology consulted; appreciate recommendations  Asymptomatic Aortic sclerosis Mild per 3/12 echo  Prediabetes A1c 6.4 -SSI  HLD Now on Crestor 40 mg daily  Disposition Patient's care to be followed by [facility] hospitalists on 3/15  Discussed with Cardiology attending, Dr. Reece Agar, [facility] 09/10/2022, 10:09 AM  ATTENDING ATTESTATION  I have seen, examined and evaluated the patient this AM on rounds along with Dr. Simeon Craft (R1).  After reviewing all the available data and chart, we discussed the patients laboratory, study & physical findings as well as symptoms in detail.  I agree with her findings, examination as well as impression recommendations as per our discussion.    Attending adjustments noted in italics.   Medically stable specimen cardiac standpoint with no angina or heart failure.  Major issue now is the bacteremia issue.  Was febrile yesterday leading to blood cultures being drawn along with urine cultures and chest x-ray.  Cultures grew out Enterococcus relatively quickly.  Thankfully, he was started antibiotics although with ceftriaxone does not cover Enterococcus and therefore restarted ampicillin yesterday.  Repeat blood cultures are pending.  ID consulted with recommendation for removing port.  We consulted IR with plans to take the patient for for port removal.  Urology following with the indwelling catheter.  This is likely the source of the enterococcal  infection.  The patient is clinically stable to transfer out of the ICU.  Will put in transfer orders but have asked the hospitalist service to take over care as he is currently being monitored by PCCM,  [facility] will take over upon transfer out.  Cardiology will stay on his consultants.    Leonie Man, MD, MS Glenetta Hew, M.D., M.S. Interventional Cardiologist  [place]  Pager # [phone] Phone # [phone] [address] [place], [place] [address]     For questions or updates, please contact [place] Please consult [email or web address] for contact info under

## 2022-09-10 NOTE — Consult Note (Signed)
   [facility] CM Inpatient Consult   09/10/2022  Larry Kent November 29, 1945 [id number]  [place] [facility] [ACO] Patient: [name]  Primary Care Provider:  Ria Bush, MD with [place] at [facility] which is listed to provide the transition of care follow up    Patient screened for less than 30 days readmission hospitalization with noted high risk score for unplanned readmission risk and  to assess for potential [place] Management service needs for post hospital transition for care coordination.  Review of patient's electronic medical record reveals patient has been engaged by a [place] for [facility] meal plan last month outreach/assistance noted.  Chart reviewed for potential post hospital additional needs.  Noted that patient transferred from [facility] to [facility] for cath and cardiac workup with ongoing follow up   Plan:  Continue to follow progress and disposition to assess for post hospital community care coordination/management needs.  Referral request for community care coordination:  continue to follow  Of note, [place] does not replace or interfere with any arrangements made by the Inpatient Transition of Care team.  For questions contact:   Natividad Brood, RN BSN [place]  [phone] business mobile phone Toll free office [phone]  *[place]  [phone] Fax number: [phone] [email or web address]

## 2022-09-10 NOTE — Progress Notes (Signed)
   NAME:  Larry Kent, MRN:  [id number], DOB:  [birth year], LOS: 3 ADMISSION DATE:  09/07/2022, CONSULTATION DATE: 09/27/2022 REFERRING MD:  Dr. Ellyn Hack, CHIEF COMPLAINT: Fever  History of Present Illness:  77 year old male with metastatic prostate cancer on palliative chemotherapy/immunotherapy, coronary artery disease who initially presented with acute inferior wall STEMI, underwent cardiac cath and stent placement.  This morning patient started spiking fever with Tmax 102, denies cough, shortness of breath, dysuria, urgency, frequency, chills or other complaints. Of note patient has hesitancy of urination, he was retaining required a coud catheter placement yesterday. His white count has trended down to 3.8  Pertinent  Medical History   Past Medical History:  Diagnosis Date   Allergy    Arthritis    both knees    BCC (basal cell carcinoma), arm, right 09/2019   MOHS ([place])   BCC (basal cell carcinoma), face 2014   L preauricular s/p MOHS   BCC (basal cell carcinoma), face 02/2018   L upper lip   BPH (benign prostatic hypertrophy)    Dr. Junious Silk @ [facility]   Carotid stenosis 28/7867   RICA 67-20%, LICA 9-47%, L vertebral occlusion, rpt 1 yr   Cataract    removed both eyes    FHx: colon cancer    FHx: prostate cancer    Heart murmur    mild aortic stenosis    History of kidney stones    Hyperlipidemia    borderline- on rosuvastatin now normal    Occlusion of right vertebral artery 05/2015     Significant Hospital Events: Including procedures, antibiotic start and stop dates in addition to other pertinent events   3/13 BCx 1/2 with E.faecalis, was on CTX but changed to Ampicillin 3/14  Interim History / Subjective:  No complaints. TMax 100.8. BCx growing E.faecalis. Abx switched from CTX to Ampicillin  Objective   Blood pressure 107/64, pulse 97, temperature (!) 100.8 F (38.2 C), temperature source Oral, resp. rate 16, height 5\' 9"  (1.753 m), weight 74.2 kg, SpO2  95 %.        Intake/Output Summary (Last 24 hours) at 09/10/2022 0843 Last data filed at 09/10/2022 0700 Gross per 24 hour  Intake 773.02 ml  Output 1685 ml  Net -911.98 ml    Filed Weights   09/07/22 1216 09/08/22 0605 09/09/22 0600  Weight: 83.5 kg 74.8 kg 74.2 kg    Examination: General: Elderly male, resting in bed, in NAD. Neuro: A&O x 3, no deficits. HEENT: [place]/AT. Sclerae anicteric. EOMI. Cardiovascular: RRR, 3/6 SEM. Lungs: Respirations even and unlabored.  CTA bilaterally, No W/R/R. Abdomen: BS x 4, soft, NT/ND.  Musculoskeletal: No gross deformities, no edema.  Skin: Intact, warm, no rashes.   Assessment & Plan:   Acute inferior STEMI s/p stent Hyperlipidemia Defer management to primary team  E.faecalis bacteremia - was initially on CTX and switched over to Ampicillin. Likely urinary source though UA not impressive. Echo from 3/12 without any suggestion of vegetations Continue Ampicillin Follow cultures through completion ID to auto-consult  Metastatic prostate cancer to LN and bones - on palliative chemo/immunotherapy Outpatient follow-up with hematology  Prediabetes Continue SSI  Best Practice (right click and "Reselect all SmartList Selections" daily)  Per primary team.   Montey Hora, [facility] For pager details, please see AMION or use Epic chat  After 1900, please call [place] for cross coverage needs 09/10/2022, 8:51 AM

## 2022-09-10 NOTE — Procedures (Signed)
Interventional Radiology Procedure:   Indications: Bacteremia   Procedure: Port removal   Findings: Complete removal of right chest port.  Pocket looked healthy and pocket was closed with absorbable suture and Dermabond.   Complications: No immediate complications noted.     EBL: Minimal  Plan: Keep incision dry for at least 24 hours.    Samit Sylve R. Anselm Pancoast, MD  Pager: [phone]

## 2022-09-10 NOTE — Consult Note (Signed)
Chief Complaint: Patient was seen in consultation today for Enterococcus bacteremia at the request of Tommy Medal, Roderic Scarce  Referring Physician(s): Tommy Medal, Roderic Scarce   Supervising Physician: Markus Daft  [facility]  History of Present Illness:  Larry Kent is a 77 y.o. male presented to 09/07/22 [facility] ED after c/o chest tightness and SOB while in the cancer center receiving care for metastatic prostate cancer. EKG in ED showed ST elevation. He was transferred to [facility] and underwent cardiac cath with stent placement. He spiked fever 3/13. BC collected at that time have resulted + Enterococcus. Pt has been referred to IR for port removal with concern for possible seeding.   Pt denies fever, chills, SOB, CP, abd pain or N/V. Pt endorses dizziness when trying to get out of bed.  Pt agrees to having port removed with one medication and local anesthetic.   Past Medical History:  Diagnosis Date   Allergy    Arthritis    both knees    BCC (basal cell carcinoma), arm, right 09/2019   MOHS ([place])   BCC (basal cell carcinoma), face 2014   L preauricular s/p MOHS   BCC (basal cell carcinoma), face 02/2018   L upper lip   BPH (benign prostatic hypertrophy)    Dr. Junious Silk @ [facility]   Carotid stenosis [id number]   RICA 123456, LICA 123456, L vertebral occlusion, rpt 1 yr   Cataract    removed both eyes    FHx: colon cancer    FHx: prostate cancer    Heart murmur    mild aortic stenosis    History of kidney stones    Hyperlipidemia    borderline- on rosuvastatin now normal    Occlusion of right vertebral artery 05/2015    Past Surgical History:  Procedure Laterality Date   BASAL CELL CARCINOMA EXCISION  05/2018   lip, 2020 x3  basal cells removed    CATARACT EXTRACTION  2008   Left   CATARACT EXTRACTION  2013   Right ([place])   COLONOSCOPY  ~2010   medium int hemorrhoids, o/w WNL, rpt 5 yrs given fmhx (Dr. Oletta Lamas)   COLONOSCOPY  08/2019   TAs,  diverticulosis, rpt 3 yrs ([place])   CORONARY/GRAFT ACUTE MI REVASCULARIZATION N/A 09/07/2022   Procedure: Coronary/Graft Acute MI Revascularization;  Surgeon: Early Osmond, MD;  Location: [place] [facility];  Service: Cardiovascular;  Laterality: N/A;   ENDARTERECTOMY Right 12/01/2019   Procedure: RIGHT ENDARTERECTOMY CAROTID;  Surgeon: [name], MD;  Location: [place];  Service: Vascular;  Laterality: Right;   exercise treadmill  11/2005   WNL [place])   FINE NEEDLE ASPIRATION  07/28/2022   Procedure: FINE NEEDLE ASPIRATION (FNA) LINEAR;  Surgeon: Garner Nash, DO;  Location: [place] [facility];  Service: Pulmonary;;   HERNIA REPAIR  2008   Right   IR IMAGING GUIDED PORT INSERTION  08/11/2022   LEFT HEART CATH AND CORONARY ANGIOGRAPHY N/A 09/07/2022   Procedure: LEFT HEART CATH AND CORONARY ANGIOGRAPHY;  Surgeon: Early Osmond, MD;  Location: [place] [facility];  Service: Cardiovascular;  Laterality: N/A;   MOHS SURGERY Left spring 2014   basal cell face   PATCH ANGIOPLASTY Right 12/01/2019   Procedure: [place];  Surgeon: [name], MD;  Location: [place];  Service: Vascular;  Laterality: Right;   Testicular Biopsy  2003   benign, varicocele   US ECHOCARDIOGRAPHY  11/2005   aortic sclerosis,  EF 0000000, diastolic dysfunction   VIDEO BRONCHOSCOPY WITH ENDOBRONCHIAL ULTRASOUND Bilateral 07/28/2022   Procedure: VIDEO BRONCHOSCOPY WITH ENDOBRONCHIAL ULTRASOUND;  Surgeon: Garner Nash, DO;  Location: [place];  Service: Pulmonary;  Laterality: Bilateral;    Allergies: Patient has no known allergies.  Medications: Prior to Admission medications   Medication Sig Start Date End Date Taking? Authorizing Provider  acetaminophen (TYLENOL) 500 MG tablet Take 1,000 mg by mouth every 6 (six) hours as needed for mild pain or headache.   Yes [provider]  fluticasone (FLONASE) 50 MCG/ACT nasal spray Place 1 spray into  both nostrils daily as needed for allergies or rhinitis.   Yes [provider]  lidocaine-prilocaine (EMLA) cream Apply to the Port-A-Cath site 30-60 minutes before chemotherapy treatment Patient taking differently: Apply 1 Application topically See admin instructions. Apply to the Port-A-Cath site 30-60 minutes before chemotherapy treatment 08/03/22  Yes Curt Bears, MD  Multiple Vitamin (MULTIVITAMIN) tablet Take 1 tablet by mouth daily with breakfast.   Yes [provider]  prochlorperazine (COMPAZINE) 10 MG tablet Take 1 tablet (10 mg total) by mouth every 6 (six) hours as needed for nausea or vomiting. 08/03/22  Yes Curt Bears, MD  rosuvastatin (CRESTOR) 20 MG tablet TAKE 1 TABLET BY MOUTH EVERY DAY Patient taking differently: Take 20 mg by mouth in the morning. 04/28/22  Yes Ria Bush, MD  SYSTANE ULTRA PF 0.4-0.3 % SOLN Place 1 drop into both eyes 3 (three) times daily as needed (for dryness/irritation).   Yes [provider]  tamsulosin (FLOMAX) 0.4 MG CAPS capsule Take 0.4 mg by mouth 2 (two) times daily.   Yes [provider]  Zinc 25 MG TABS Take 25 mg by mouth daily.   Yes [provider]     Family History  Problem Relation Age of Onset   Cancer Father 75       prostate   Coronary artery disease Father 62       MI   Prostate cancer Father    Cancer Paternal Grandfather 42       prostate   Prostate cancer Paternal Grandfather    Stroke Mother    Hypertension Mother    Cancer Mother 13       colon   Colon cancer Mother    Coronary artery disease Maternal Uncle    Cancer Sister 58       breast   Breast cancer Sister    Colon cancer Maternal Grandmother    Diabetes Neg Hx    Colon polyps Neg Hx    Esophageal cancer Neg Hx    Rectal cancer Neg Hx    Stomach cancer Neg Hx     Social History   Socioeconomic History   Marital status: Married    Spouse name: Not on file   Number of children: Not on file    Years of education: Not on file   Highest education level: Not on file  Occupational History   Not on file  Tobacco Use   Smoking status: Never   Smokeless tobacco: Never  Vaping Use   Vaping Use: Never used  Substance and Sexual Activity   Alcohol use: Not Currently    Comment: rare   Drug use: No   Sexual activity: Not on file  Other Topics Concern   Not on file  Social History Narrative   Caffeine: 2-3 cups coffee/day   Lives with wife and adult son, 1 dog   Occupation: Press photographer  and installation   Edu: college   Activity: golf, no regular exercise, occasionally walks with wife   Diet: good amt water, daily fruits/vegetables, fish several times a week   Social Determinants of Health   Financial Resource Strain: Low Risk  (08/18/2022)   Overall Financial Resource Strain (CARDIA)    Difficulty of Paying Living Expenses: Not very hard  Food Insecurity: No Food Insecurity (08/25/2022)   Hunger Vital Sign    Worried About Running Out of Food in the Last Year: Never true    Ran Out of Food in the Last Year: Never true  Transportation Needs: No Transportation Needs (08/25/2022)   PRAPARE - Hydrologist (Medical): No    Lack of Transportation (Non-Medical): No  Physical Activity: Inactive (07/14/2019)   Exercise Vital Sign    Days of Exercise per Week: 0 days    Minutes of Exercise per Session: 0 min  Stress: No Stress Concern Present (07/14/2019)   [place]    Feeling of Stress : Not at all  Social Connections: Not on file     Review of Systems: A 12 point ROS discussed and pertinent positives are indicated in the HPI above.  All other systems are negative.  Review of Systems  Constitutional:  Negative for appetite change, chills and fever.  Respiratory:  Negative for shortness of breath.   Cardiovascular:  Negative for chest pain.  Gastrointestinal:  Negative for abdominal pain,  nausea and vomiting.  Neurological:  Positive for dizziness.    Vital Signs: BP 109/73   Pulse 88   Temp (!) 101.3 F (38.5 C) (Oral)   Resp 15   Ht '5\' 9"'$  (1.753 m)   Wt 163 lb 9.3 oz (74.2 kg)   SpO2 98%   BMI 24.16 kg/m    Physical Exam Vitals reviewed.  Constitutional:      General: He is not in acute distress.    Appearance: Normal appearance. He is ill-appearing.  HENT:     Head: Normocephalic and atraumatic.     Mouth/Throat:     Mouth: Mucous membranes are moist.     Pharynx: Oropharynx is clear.  Cardiovascular:     Rate and Rhythm: Normal rate and regular rhythm.     Pulses: Normal pulses.     Heart sounds: Murmur heard.  Pulmonary:     Effort: No respiratory distress.     Breath sounds: Normal breath sounds.  Abdominal:     General: Bowel sounds are normal.     Palpations: Abdomen is soft.  Musculoskeletal:     Right lower leg: No edema.     Left lower leg: No edema.  Skin:    General: Skin is warm and dry.  Neurological:     Mental Status: He is alert and oriented to person, place, and time.  Psychiatric:        Mood and Affect: Mood normal.        Behavior: Behavior normal.        Thought Content: Thought content normal.        Judgment: Judgment normal.     Imaging: DG Chest Port 1 View  Result Date: 09/09/2022 CLINICAL DATA:  Tachycardia EXAM: PORTABLE CHEST 1 VIEW COMPARISON:  CXR 08/22/22 FINDINGS: No pleural effusion. No pneumothorax. Right chest port with tip near the cavoatrial junction. Compared to prior exam there are new patchy airspace opacities at the bilateral lung bases, which  could represent atelectasis or infection. No radiographically apparent displaced rib fractures. Visualized upper abdomen is unremarkable. IMPRESSION: Compared to prior exam there are new patchy airspace opacities at the bilateral lung bases, which could represent atelectasis or infection. Electronically Signed   By: Marin Roberts M.D.   On: 09/09/2022 09:55    ECHOCARDIOGRAM COMPLETE  Result Date: 09/08/2022    ECHOCARDIOGRAM REPORT   Patient Name:   KENWARD QUINATA Date of Exam: 09/08/2022 Medical Rec #:  [id number]       Height:       69.0 in Accession #:    [id number]      Weight:       164.9 lb Date of Birth:  September 08, 1945       BSA:          1.903 m Patient Age:    89 years        BP:           110/65 mmHg Patient Gender: M               HR:           67 bpm. Exam Location:  Inpatient Procedure: 2D Echo, 3D Echo, Cardiac Doppler, Color Doppler and Intracardiac            Opacification Agent Indications:    Acute MI  History:        Patient has prior history of Echocardiogram examinations, most                 recent 10/22/2020. Carotid Disease, Signs/Symptoms:Murmur and                 Chest Pain; Risk Factors:Dyslipidemia. Hx of cancer,                 chemotherapy.  Sonographer:    Eartha Inch Referring Phys: [id number] Early Osmond  Sonographer Comments: Image acquisition challenging due to patient body habitus and Image acquisition challenging due to respiratory motion. IMPRESSIONS  1. Left ventricular ejection fraction, by estimation, is 65 to 70%. The left ventricle has normal function. The left ventricle demonstrates regional wall motion abnormalities (see scoring diagram/findings for description). There is moderate concentric left ventricular hypertrophy. Left ventricular diastolic parameters are indeterminate.  2. Right ventricular systolic function is normal. The right ventricular size is moderately enlarged. There is normal pulmonary artery systolic pressure.  3. Right atrial size was mildly dilated.  4. The mitral valve is degenerative. No evidence of mitral valve regurgitation. No evidence of mitral stenosis. The mean mitral valve gradient is 3.0 mmHg with average heart rate of 66 bpm. Severe mitral annular calcification.  5. Mixed valve disease. Central aortic regurgitation, normal LV stroke volume index. The aortic valve is calcified. Aortic valve  regurgitation is mild to moderate. Mild aortic valve stenosis. Aortic valve mean gradient measures 14.0 mmHg.  6. The inferior vena cava is normal in size with greater than 50% respiratory variability, suggesting right atrial pressure of 3 mmHg. Comparison(s): Prior images reviewed side by side. Similar aortic findings. LV wall motion comparison is difficult- WMA seen best on contrast images, prior contrast images not performed. FINDINGS  Left Ventricle: Left ventricular ejection fraction, by estimation, is 65 to 70%. The left ventricle has normal function. The left ventricle demonstrates regional wall motion abnormalities. The left ventricular internal cavity size was normal in size. There is moderate concentric left ventricular hypertrophy. Left ventricular diastolic parameters are indeterminate.  LV Wall Scoring: The mid  and distal anterior septum and mid inferoseptal segment are hypokinetic. Right Ventricle: The right ventricular size is moderately enlarged. No increase in right ventricular wall thickness. Right ventricular systolic function is normal. There is normal pulmonary artery systolic pressure. The tricuspid regurgitant velocity is 2.00 m/s, and with an assumed right atrial pressure of 3 mmHg, the estimated right ventricular systolic pressure is [id number] mmHg. Left Atrium: Left atrial size was normal in size. Right Atrium: Right atrial size was mildly dilated. Pericardium: There is no evidence of pericardial effusion. Mitral Valve: The mitral valve is degenerative in appearance. Severe mitral annular calcification. No evidence of mitral valve regurgitation. No evidence of mitral valve stenosis. MV peak gradient, 9.5 mmHg. The mean mitral valve gradient is 3.0 mmHg with average heart rate of 66 bpm. Tricuspid Valve: The tricuspid valve is normal in structure. Tricuspid valve regurgitation is trivial. No evidence of tricuspid stenosis. Aortic Valve: Mixed valve disease. Central aortic regurgitation, normal LV  stroke volume index. The aortic valve is calcified. Aortic valve regurgitation is mild to moderate. Aortic regurgitation PHT measures 651 msec. Mild aortic stenosis is present. Aortic valve mean gradient measures 14.0 mmHg. Aortic valve peak gradient measures 21.9 mmHg. Aortic valve area, by VTI measures 2.18 cm. Pulmonic Valve: The pulmonic valve was not well visualized. Pulmonic valve regurgitation is not visualized. Aorta: The aortic root and ascending aorta are structurally normal, with no evidence of dilitation. Venous: The inferior vena cava is normal in size with greater than 50% respiratory variability, suggesting right atrial pressure of 3 mmHg. IAS/Shunts: No atrial level shunt detected by color flow Doppler.  LEFT VENTRICLE PLAX 2D LVIDd:         4.10 cm      Diastology LVIDs:         2.70 cm      LV e' medial:    5.11 cm/s LV PW:         1.30 cm      LV E/e' medial:  19.4 LV IVS:        1.15 cm      LV e' lateral:   7.40 cm/s LVOT diam:     2.10 cm      LV E/e' lateral: 13.4 LV SV:         108 LV SV Index:   57 LVOT Area:     3.46 cm  LV Volumes (MOD) LV vol d, MOD A2C: 97.7 ml LV vol d, MOD A4C: 134.0 ml LV vol s, MOD A2C: 26.3 ml LV vol s, MOD A4C: 48.9 ml LV SV MOD A2C:     71.4 ml LV SV MOD A4C:     134.0 ml LV SV MOD BP:      77.7 ml RIGHT VENTRICLE             IVC RV S prime:     13.70 cm/s  IVC diam: 1.80 cm TAPSE (M-mode): 2.0 cm LEFT ATRIUM             Index        RIGHT ATRIUM           Index LA diam:        3.90 cm 2.05 cm/m   RA Area:     21.00 cm LA Vol (A2C):   65.0 ml 34.15 ml/m  RA Volume:   66.00 ml  34.68 ml/m LA Vol (A4C):   53.2 ml 27.95 ml/m LA Biplane Vol: 61.3 ml 32.21 ml/m  AORTIC VALVE  AV Area (Vmax):    2.06 cm AV Area (Vmean):   1.85 cm AV Area (VTI):     2.18 cm AV Vmax:           234.00 cm/s AV Vmean:          181.000 cm/s AV VTI:            0.495 m AV Peak Grad:      21.9 mmHg AV Mean Grad:      14.0 mmHg LVOT Vmax:         139.00 cm/s LVOT Vmean:        96.800  cm/s LVOT VTI:          0.312 m LVOT/AV VTI ratio: 0.63 AI PHT:            651 msec  AORTA Ao Root diam: 3.20 cm Ao Asc diam:  3.60 cm MITRAL VALVE                TRICUSPID VALVE MV Area (PHT): 2.76 cm     TR Peak grad:   16.0 mmHg MV Area VTI:   2.22 cm     TR Vmax:        200.00 cm/s MV Peak grad:  9.5 mmHg MV Mean grad:  3.0 mmHg     SHUNTS MV Vmax:       1.54 m/s     Systemic VTI:  0.31 m MV Vmean:      88.1 cm/s    Systemic Diam: 2.10 cm MV Decel Time: 275 msec MV E velocity: 99.00 cm/s MV A velocity: 127.00 cm/s MV E/A ratio:  0.78 Rudean Haskell MD Electronically signed by Rudean Haskell MD Signature Date/Time: 09/08/2022/1:09:21 PM    Final    CARDIAC CATHETERIZATION  Result Date: 09/07/2022   Ost RCA lesion is 80% stenosed.   Prox RCA lesion is 70% stenosed.   Mid LM to Dist LM lesion is 50% stenosed.   Dist LM to Ost LAD lesion is 50% stenosed.   Ost Cx lesion is 40% stenosed.   Prox Cx lesion is 60% stenosed.   Mid LAD lesion is 60% stenosed.   A stent was successfully placed.   Post intervention, there is a 0% residual stenosis.   Post intervention, there is a 0% residual stenosis. 1.  Thrombotic occlusion of right coronary artery treated with 4 overlapping drug-eluting stents. 2.  Distal left main, ostial LAD, and ostial left circumflex disease. 3.  LVEDP of 11 mmHg. Recommendation: The results were discussed with Dr. Ellyn Hack.  The patient will be admitted to the hospital, medical therapy will be pursued as well as an echocardiogram.  Will follow-up as an outpatient regarding treatment for residual disease.   DG Chest 2 View  Result Date: 08/22/2022 CLINICAL DATA:  Questionable sepsis.  Multiple pulmonary metastases. EXAM: CHEST - 2 VIEW COMPARISON:  CT abdomen and pelvis no contrast 08/18/2022, CT chest with contrast 07/15/2022 FINDINGS: Heart size and vasculature are normal apart from calcification in the transverse aorta. There is a right chest port newly noted with IJ approach  catheter terminating in the distal SVC. There are numerous nodular pulmonary metastases predominating in the lower zones. Largest on the left is in the upper lobe measuring 1.7 cm. Largest on the right is 2 cm in the middle lobe. Again noted is a small left pleural effusion. No focal pneumonia is seen. The right sulci are sharp. There are degenerative changes and mild kyphodextroscoliosis of the  thoracic spine with osteopenia. No thoracic compression fractures. IMPRESSION: 1. Numerous nodular pulmonary metastases. 2. Small left pleural effusion. 3. No evidence of pneumonia or edema. 4. Aortic atherosclerosis. 5. Right chest port newly noted. Electronically Signed   By: Telford Nab M.D.   On: 08/22/2022 00:34   CT ABDOMEN PELVIS WO CONTRAST  Result Date: 08/18/2022 CLINICAL DATA:  Abdominal pain, acute, nonlocalized EXAM: CT ABDOMEN AND PELVIS WITHOUT CONTRAST TECHNIQUE: Multidetector CT imaging of the abdomen and pelvis was performed following the standard protocol without IV contrast. RADIATION DOSE REDUCTION: This exam was performed according to the departmental dose-optimization program which includes automated exposure control, adjustment of the mA and/or kV according to patient size and/or use of iterative reconstruction technique. COMPARISON:  PET-CT 07/15/2022 FINDINGS: Lower chest: Stable small left pleural effusion. Coronary calcifications. Innumerable pulmonary nodules. Largest 2.8 cm in the right middle lobe, previously 2.4 cm. Largest left nodule in the anterior basal segment left lower lobe image 20/3 2.1 cm, previously 1.9. Hepatobiliary: No focal liver abnormality is seen. No gallstones, gallbladder wall thickening, or biliary dilatation. Pancreas: Diffuse parenchymal atrophy without mass or ductal dilatation. No regional inflammatory change. Spleen: Normal in size without focal abnormality. Adrenals/Urinary Tract: No adrenal mass. Left nephrolithiasis, largest stone 3 mm midpole. There is mild  bilateral hydronephrosis right greater than left and ureterectasis down to thick-walled urinary bladder, partially decompressed by Foley catheter. Stomach/Bowel: Stomach is nondistended. Small bowel decompressed. Normal appendix. Colon is partially distended by gas and fecal material, with scattered distal descending and sigmoid diverticula; no adjacent inflammatory change. Vascular/Lymphatic: Moderate scattered calcified aortoiliac calcified plaque without aneurysm. Retroaortic left renal vein, anatomic variant. No abdominal or pelvic adenopathy. Reproductive: Prostate enlargement with coarse calcifications Other: No ascites.  No free air. Musculoskeletal: Mild lumbar dextroscoliosis apex L3 with multilevel spondylitic change. Sclerotic lesions in bilateral inferior ischiopubic rami, left sacrum. For no fracture or acute process. IMPRESSION: 1. Mild bilateral hydronephrosis and ureterectasis down to thick-walled urinary bladder, partially decompressed by Foley catheter. 2. Right nephrolithiasis. 3. Innumerable pulmonary nodules, some of which have increased in size. 4. Stable sacral and pelvic bone lesions. 5. Stable small left pleural effusion. 6. Descending and sigmoid diverticulosis. 7.  Aortic Atherosclerosis (ICD10-I70.0). Electronically Signed   By: Lucrezia Europe M.D.   On: 08/18/2022 17:52    Labs:  CBC: Recent Labs    09/08/22 0020 09/09/22 0740 09/09/22 0932 09/10/22 0815  WBC 8.1 4.3 3.8* 3.0*  HGB 8.8* 8.2* 8.3* 8.2*  HCT 25.6* 22.9* 23.4* 23.1*  PLT 270 183 182 124*    COAGS: Recent Labs    08/21/22 2340 09/07/22 1323  INR 1.1 1.2  APTT 38* >200*    BMP: Recent Labs    09/07/22 1225 09/07/22 1324 09/08/22 0020 09/09/22 0740 09/10/22 0815  NA 136 136 134* 129* 128*  K 4.4 4.0 4.4 3.8 3.6  CL 103 104 105 99 100  CO2 24  --  20* 17* 20*  GLUCOSE 159* 150* 135* 113* 120*  BUN 19 20 24* 23 19  CALCIUM 9.0  --  8.4* 8.4* 8.2*  CREATININE 0.94 0.70 0.92 1.00 1.00   GFRNONAA >60  --  >60 >60 >60    LIVER FUNCTION TESTS: Recent Labs    08/22/22 0500 08/27/22 1044 08/31/22 1038 09/07/22 1111  BILITOT 0.7 0.3 0.3 0.6  AST 20 28 32 39  ALT 26 32 34 54*  ALKPHOS 101 175* 152* 160*  PROT 5.8* 6.1* 6.6 6.8  ALBUMIN 2.8*  3.4* 3.5 3.7    TUMOR MARKERS: No results for input(s): "AFPTM", "CEA", "CA199", "CHROMGRNA" in the last 8760 hours.  Assessment and Plan:  77 yo male with medical history significant for prostate cancer and neuroendocrine carcinoma, s/p cardiac cath with stent placement presents to IR for tunneled catheter with port removal for bacteremia.   Pt resting in bed.  He is A&O, calm and pleasant.  He is in no distress.  Pt agreeable to having port removed with one IV medication and local anesthetic.   WBC 3.0 T max 101.3, other VSS  Risks and benefits of tunneled catheter with port removal was discussed with the patient including, but not limited to bleeding, infection, pneumothorax or additional procedures if port is not removed intact.   All of the patient's questions were answered, patient is agreeable to proceed.  Consent signed and in chart.  Thank you for this interesting consult.  I greatly enjoyed meeting Larry Kent and look forward to participating in their care.  A copy of this report was sent to the requesting provider on this date.  Electronically Signed: Tyson Alias, NP 09/10/2022, 1:26 PM   I spent a total of 20 minutes in face to face in clinical consultation, greater than 50% of which was counseling/coordinating care for Enterococcus bacteremia.

## 2022-09-11 DIAGNOSIS — T80212D Local infection due to central venous catheter, subsequent encounter: Secondary | ICD-10-CM

## 2022-09-11 DIAGNOSIS — T83511D Infection and inflammatory reaction due to indwelling urethral catheter, subsequent encounter: Secondary | ICD-10-CM

## 2022-09-11 DIAGNOSIS — R7881 Bacteremia: Secondary | ICD-10-CM | POA: Diagnosis not present

## 2022-09-11 DIAGNOSIS — N390 Urinary tract infection, site not specified: Secondary | ICD-10-CM

## 2022-09-11 DIAGNOSIS — I25110 Atherosclerotic heart disease of native coronary artery with unstable angina pectoris: Secondary | ICD-10-CM | POA: Diagnosis not present

## 2022-09-11 DIAGNOSIS — I2119 ST elevation (STEMI) myocardial infarction involving other coronary artery of inferior wall: Secondary | ICD-10-CM | POA: Diagnosis not present

## 2022-09-11 DIAGNOSIS — B952 Enterococcus as the cause of diseases classified elsewhere: Secondary | ICD-10-CM | POA: Diagnosis not present

## 2022-09-11 DIAGNOSIS — Z955 Presence of coronary angioplasty implant and graft: Secondary | ICD-10-CM | POA: Diagnosis not present

## 2022-09-11 LAB — CULTURE, BLOOD (ROUTINE X 2)
Special Requests: ADEQUATE
Special Requests: ADEQUATE

## 2022-09-11 LAB — PREPARE RBC (CROSSMATCH)

## 2022-09-11 LAB — BASIC METABOLIC PANEL
Anion gap: 6 (ref 5–15)
Anion gap: 9 (ref 5–15)
BUN: 20 mg/dL (ref 8–23)
BUN: 16 mg/dL (ref 8–23)
CO2: 19 mmol/L — ABNORMAL LOW (ref 22–32)
CO2: 19 mmol/L — ABNORMAL LOW (ref 22–32)
Calcium: 7.9 mg/dL — ABNORMAL LOW (ref 8.9–10.3)
Calcium: 8 mg/dL — ABNORMAL LOW (ref 8.9–10.3)
Chloride: 103 mmol/L (ref 98–111)
Chloride: 104 mmol/L (ref 98–111)
Creatinine, Ser: 0.98 mg/dL (ref 0.61–1.24)
Creatinine, Ser: 0.87 mg/dL (ref 0.61–1.24)
GFR, Estimated: >60 mL/min (ref >60)
GFR, Estimated: >60 mL/min (ref >60)
Glucose, Bld: 101 mg/dL — ABNORMAL HIGH (ref 70–99)
Glucose, Bld: 114 mg/dL — ABNORMAL HIGH (ref 70–99)
Potassium: 3.4 mmol/L — ABNORMAL LOW (ref 3.5–5.1)
Potassium: 3.9 mmol/L (ref 3.5–5.1)
Sodium: 128 mmol/L — ABNORMAL LOW (ref 135–145)
Sodium: 132 mmol/L — ABNORMAL LOW (ref 135–145)

## 2022-09-11 LAB — GLUCOSE, CAPILLARY
Glucose-Capillary: 92 mg/dL (ref 70–99)
Glucose-Capillary: 100 mg/dL — ABNORMAL HIGH (ref 70–99)
Glucose-Capillary: 134 mg/dL — ABNORMAL HIGH (ref 70–99)
Glucose-Capillary: 145 mg/dL — ABNORMAL HIGH (ref 70–99)

## 2022-09-11 LAB — URINALYSIS, ROUTINE W REFLEX MICROSCOPIC
Bacteria, UA: NONE SEEN
Bilirubin Urine: NEGATIVE
Glucose, UA: NEGATIVE mg/dL
Ketones, ur: NEGATIVE mg/dL
Leukocytes,Ua: NEGATIVE
Nitrite: NEGATIVE
Protein, ur: NEGATIVE mg/dL
Specific Gravity, Urine: 1.016 (ref 1.005–1.030)
pH: 6 (ref 5.0–8.0)

## 2022-09-11 LAB — CBC
HCT: 21.6 % — ABNORMAL LOW (ref 39.0–52.0)
Hemoglobin: 7.4 g/dL — ABNORMAL LOW (ref 13.0–17.0)
MCH: 34.9 pg — ABNORMAL HIGH (ref 26.0–34.0)
MCHC: 34.3 g/dL (ref 30.0–36.0)
MCV: 101.9 fL — ABNORMAL HIGH (ref 80.0–100.0)
Platelets: 100 10*3/uL — ABNORMAL LOW (ref 150–400)
RBC: 2.12 MIL/uL — ABNORMAL LOW (ref 4.22–5.81)
RDW: 14 % (ref 11.5–15.5)
WBC: 2 10*3/uL — ABNORMAL LOW (ref 4.0–10.5)
nRBC: 0 % (ref 0.0–0.2)

## 2022-09-11 LAB — HEMOGLOBIN AND HEMATOCRIT, BLOOD
HCT: 26.3 % — ABNORMAL LOW (ref 39.0–52.0)
Hemoglobin: 8.9 g/dL — ABNORMAL LOW (ref 13.0–17.0)

## 2022-09-11 LAB — TSH: TSH: 2.144 u[IU]/mL (ref 0.350–4.500)

## 2022-09-11 LAB — URINE CULTURE: Culture: >=100000 — AB

## 2022-09-11 LAB — OSMOLALITY, URINE: Osmolality, Ur: 539 mOsm/kg (ref 300–900)

## 2022-09-11 LAB — OSMOLALITY: Osmolality: 284 mOsm/kg (ref 275–295)

## 2022-09-11 LAB — SODIUM, URINE, RANDOM: Sodium, Ur: 95 mmol/L

## 2022-09-11 LAB — CORTISOL: Cortisol, Plasma: 13.4 ug/dL

## 2022-09-11 LAB — MAGNESIUM: Magnesium: 1.7 mg/dL (ref 1.7–2.4)

## 2022-09-11 MED ORDER — POTASSIUM CHLORIDE 20 MEQ PO PACK
40.0000 meq | PACK | ORAL | Status: DC
  Filled 2022-09-11: qty 2

## 2022-09-11 MED ORDER — POTASSIUM CHLORIDE CRYS ER 20 MEQ PO TBCR
40.0000 meq | EXTENDED_RELEASE_TABLET | Freq: Two times a day (BID) | ORAL | Status: AC
  Administered 2022-09-11 (×2): 40 meq via ORAL
  Filled 2022-09-11 (×2): qty 2

## 2022-09-11 MED ORDER — SORBITOL 70 % SOLN
30.0000 mL | Freq: Once | Status: AC
  Administered 2022-09-11: 30 mL via ORAL
  Filled 2022-09-11: qty 30

## 2022-09-11 MED ORDER — SENNOSIDES-DOCUSATE SODIUM 8.6-50 MG PO TABS
1.0000 | ORAL_TABLET | Freq: Every day | ORAL | Status: DC
  Administered 2022-09-11 – 2022-09-15 (×5): 1 via ORAL
  Filled 2022-09-11 (×5): qty 1

## 2022-09-11 MED ORDER — SODIUM CHLORIDE 0.9% IV SOLUTION: Freq: Once | INTRAVENOUS | Status: DC

## 2022-09-11 MED ORDER — SODIUM CHLORIDE 0.9 % IV BOLUS
1000.0000 mL | Freq: Once | INTRAVENOUS | Status: AC
  Administered 2022-09-11: 500 mL via INTRAVENOUS

## 2022-09-11 MED ORDER — ORAL CARE MOUTH RINSE: 15.0000 mL | OROMUCOSAL | Status: DC | PRN

## 2022-09-11 MED ORDER — TBO-FILGRASTIM 300 MCG/0.5ML ~~LOC~~ SOSY: 300.0000 ug | PREFILLED_SYRINGE | Freq: Every day | SUBCUTANEOUS | Status: DC

## 2022-09-11 MED ORDER — POTASSIUM CHLORIDE CRYS ER 20 MEQ PO TBCR
40.0000 meq | EXTENDED_RELEASE_TABLET | Freq: Once | ORAL | Status: AC
  Administered 2022-09-11: 40 meq via ORAL
  Filled 2022-09-11: qty 2

## 2022-09-11 MED ORDER — MAGNESIUM SULFATE 2 GM/50ML IV SOLN
2.0000 g | Freq: Once | INTRAVENOUS | Status: AC
  Administered 2022-09-11: 2 g via INTRAVENOUS
  Filled 2022-09-11: qty 50

## 2022-09-11 NOTE — Progress Notes (Signed)
IRAM SEMINARIO   DOB:[birth year]   [id number]    ASSESSMENT & PLAN:  Metastatic neuroendocrine carcinoma Advanced prostate cancer The patient just completed chemotherapy a week ago He is scheduled for CT imaging next week That probably needs to be rescheduled Continue supportive care for now  Acquired pancytopenia Due to recent chemotherapy Agree with blood transfusion to keep hemoglobin greater than 8 Continue antiplatelet agents; no need for platelet transfusion unless less than 20,000 or signs of bleeding I have prescribed G-CSF support; will give him G-CSF if ANC is less than 1.5  Bacteremia Port has been removed He is on IV antibiotics as directed by infectious disease team  Coronary artery disease status post stent placement Continue medical management We will monitor his platelet count carefully  Goals of care discussion He had multiple significant comorbidities, all being addressed at the same time.  He inquired about the role of palliative care consult and we discussed the benefits of palliative care consult in general  Discharge planning He would likely be here over the next 3 to 5 days   All questions were answered. The patient knows to call the clinic with any problems, questions or concerns.   The total time spent in the appointment was 55 minutes encounter with patients including review of chart and various tests results, discussions about plan of care and coordination of care plan  Heath Lark, MD 09/11/2022 5:15 PM  Subjective:  This is a patient of Dr. Julien Nordmann.  I am covering for him in his absence.  I was notified by the ICU team about his admission.  I reviewed his chart extensively.  The patient also shared with me his significant grief and anger related to delayed diagnosis of his cancer. The patient has been having urinary frequency since end of last year.  When his symptoms did not improve, he had another visit in January.  CT urogram came back abnormal  leading to further evaluation and subsequent diagnosis of metastatic cancer with neuroendocrine features as well as prostate cancer. He has been receiving chemotherapy since February, complicated by several admissions with complications with neutropenic fever.  With this current admission, he developed chest tightness leading to further evaluation and on admission, was diagnosed with ST elevation MI.  He underwent coronary angiogram which revealed thrombotic occlusion of right coronary artery treated with drug-eluting stent.  Soon after the procedure, he has significant urinary retention requiring Foley catheter placement.  The patient then started to develop fever.  The patient also have elevated white count on admission.  The cause of the elevated white count could be due to G-CSF support.  Soon after, his blood culture came back positive.  Infectious disease was involved and in consultation with interventional radiologist, his port was removed.  He has been afebrile over the last 24 hours. Starting 2 days ago, he started to develop pancytopenia.  This morning, his white blood cell count was 2.0, hemoglobin 7.4 and platelet count is 100,000. I was notified of the ICU team to see the patient.  He has been transfused earlier today. The patient denies any recent signs or symptoms of bleeding such as spontaneous epistaxis, hematuria or hematochezia. Currently, he denies chest pain or shortness of breath.  He had numerous questions related to plan of care and what to expect in the future.  The patient has expressed some frustration to me related to his diagnosis and he is concerned about the wellbeing of his wife and multiple bad news.  He  question about the role of palliative care  Objective:  Vitals:   09/11/22 1545 09/11/22 1600  BP: 119/69 116/68  Pulse: 74 69  Resp: (!) 21 15  Temp: 99.2 F (37.3 C)   SpO2: 98% 97%     Intake/Output Summary (Last 24 hours) at 09/11/2022 1715 Last data filed at  09/11/2022 1600 Gross per 24 hour  Intake 2429.95 ml  Output 3660 ml  Net -1230.05 ml    GENERAL:alert, no distress and comfortable  NEURO: alert & oriented x 3 with fluent speech, no focal motor/sensory deficits   Labs:  Recent Labs    08/22/22 0500 08/23/22 0500 08/27/22 1044 08/31/22 1038 09/07/22 1111 09/07/22 1225 09/09/22 0740 09/10/22 0815 09/11/22 0302  NA 133*   < > 138 137 136   < > 129* 128* 128*  K 3.9   < > 3.9 3.9 4.3   < > 3.8 3.6 3.4*  CL 104   < > 105 104 104   < > 99 100 103  CO2 22   < > 27 28 24    < > 17* 20* 19*  GLUCOSE 112*   < > 115* 143* 160*   < > 113* 120* 101*  BUN 26*   < > 14 16 24*   < > 23 19 20   CREATININE 0.97   < > 0.75 0.89 0.88   < > 1.00 1.00 0.98  CALCIUM 8.2*   < > 8.5* 8.7* 9.1   < > 8.4* 8.2* 7.9*  GFRNONAA >60   < > >60 >60 >60   < > >60 >60 >60  PROT 5.8*  --  6.1* 6.6 6.8  --   --   --   --   ALBUMIN 2.8*  --  3.4* 3.5 3.7  --   --   --   --   AST 20  --  28 32 39  --   --   --   --   ALT 26  --  32 34 54*  --   --   --   --   ALKPHOS 101  --  175* 152* 160*  --   --   --   --   BILITOT 0.7  --  0.3 0.3 0.6  --   --   --   --   BILIDIR 0.2  --   --   --   --   --   --   --   --   IBILI 0.5  --   --   --   --   --   --   --   --    < > = values in this interval not displayed.    Studies:  IR REMOVAL TUN ACCESS W/ PORT W/O FL MOD SED  Result Date: 09/10/2022 INDICATION: 77 year old with Enterococcus bacteremia. Request for Port-A-Cath removal. EXAM: REMOVAL RIGHT IJ VEIN PORT-A-CATH MEDICATIONS: Versed 1 mg ANESTHESIA/SEDATION: Patient was monitored by a radiology nurse throughout the procedure. FLUOROSCOPY: None COMPLICATIONS: None immediate. PROCEDURE: Informed written consent was obtained from the patient after a thorough discussion of the procedural risks, benefits and alternatives. All questions were addressed. Maximal Sterile Barrier Technique was utilized including caps, mask, sterile gowns, sterile gloves, sterile drape,  hand hygiene and skin antiseptic. A timeout was performed prior to the initiation of the procedure. The right chest was prepped and draped in a sterile fashion. Lidocaine was utilized for local anesthesia. An incision was made  over the previously healed surgical incision. Utilizing blunt dissection, the port catheter and reservoir were removed from the underlying subcutaneous tissue in their entirety. The pocket was irrigated with a copious amount of sterile normal saline. The subcutaneous tissue was closed with 3-0 Vicryl interrupted subcutaneous stitches. A 4-0 Vicryl running subcuticular stitch was utilized to approximate the skin. Dermabond was applied. FINDINGS: Port was completely removed. Port pocket appeared healthy without fluid or drainage. IMPRESSION: Successful right IJ vein Port-A-Cath explant. Electronically Signed   By: Markus Daft M.D.   On: 09/10/2022 21:19   DG Chest Port 1 View  Result Date: 09/09/2022 CLINICAL DATA:  Tachycardia EXAM: PORTABLE CHEST 1 VIEW COMPARISON:  CXR 08/22/22 FINDINGS: No pleural effusion. No pneumothorax. Right chest port with tip near the cavoatrial junction. Compared to prior exam there are new patchy airspace opacities at the bilateral lung bases, which could represent atelectasis or infection. No radiographically apparent displaced rib fractures. Visualized upper abdomen is unremarkable. IMPRESSION: Compared to prior exam there are new patchy airspace opacities at the bilateral lung bases, which could represent atelectasis or infection. Electronically Signed   By: Marin Roberts M.D.   On: 09/09/2022 09:55   ECHOCARDIOGRAM COMPLETE  Result Date: 09/08/2022    ECHOCARDIOGRAM REPORT   Patient Name:   MYLIN STOLAR Date of Exam: 09/08/2022 Medical Rec #:  [id number]       Height:       69.0 in Accession #:    [id number]      Weight:       164.9 lb Date of Birth:  February 09, 1946       BSA:          1.903 m Patient Age:    39 years        BP:           110/65 mmHg Patient  Gender: M               HR:           67 bpm. Exam Location:  Inpatient Procedure: 2D Echo, 3D Echo, Cardiac Doppler, Color Doppler and Intracardiac            Opacification Agent Indications:    Acute MI  History:        Patient has prior history of Echocardiogram examinations, most                 recent 10/22/2020. Carotid Disease, Signs/Symptoms:Murmur and                 Chest Pain; Risk Factors:Dyslipidemia. Hx of cancer,                 chemotherapy.  Sonographer:    Eartha Inch Referring Phys: [id number] Early Osmond  Sonographer Comments: Image acquisition challenging due to patient body habitus and Image acquisition challenging due to respiratory motion. IMPRESSIONS  1. Left ventricular ejection fraction, by estimation, is 65 to 70%. The left ventricle has normal function. The left ventricle demonstrates regional wall motion abnormalities (see scoring diagram/findings for description). There is moderate concentric left ventricular hypertrophy. Left ventricular diastolic parameters are indeterminate.  2. Right ventricular systolic function is normal. The right ventricular size is moderately enlarged. There is normal pulmonary artery systolic pressure.  3. Right atrial size was mildly dilated.  4. The mitral valve is degenerative. No evidence of mitral valve regurgitation. No evidence of mitral stenosis. The mean mitral valve gradient is 3.0 mmHg with average heart rate  of 66 bpm. Severe mitral annular calcification.  5. Mixed valve disease. Central aortic regurgitation, normal LV stroke volume index. The aortic valve is calcified. Aortic valve regurgitation is mild to moderate. Mild aortic valve stenosis. Aortic valve mean gradient measures 14.0 mmHg.  6. The inferior vena cava is normal in size with greater than 50% respiratory variability, suggesting right atrial pressure of 3 mmHg. Comparison(s): Prior images reviewed side by side. Similar aortic findings. LV wall motion comparison is difficult- WMA  seen best on contrast images, prior contrast images not performed. FINDINGS  Left Ventricle: Left ventricular ejection fraction, by estimation, is 65 to 70%. The left ventricle has normal function. The left ventricle demonstrates regional wall motion abnormalities. The left ventricular internal cavity size was normal in size. There is moderate concentric left ventricular hypertrophy. Left ventricular diastolic parameters are indeterminate.  LV Wall Scoring: The mid and distal anterior septum and mid inferoseptal segment are hypokinetic. Right Ventricle: The right ventricular size is moderately enlarged. No increase in right ventricular wall thickness. Right ventricular systolic function is normal. There is normal pulmonary artery systolic pressure. The tricuspid regurgitant velocity is 2.00 m/s, and with an assumed right atrial pressure of 3 mmHg, the estimated right ventricular systolic pressure is [id number] mmHg. Left Atrium: Left atrial size was normal in size. Right Atrium: Right atrial size was mildly dilated. Pericardium: There is no evidence of pericardial effusion. Mitral Valve: The mitral valve is degenerative in appearance. Severe mitral annular calcification. No evidence of mitral valve regurgitation. No evidence of mitral valve stenosis. MV peak gradient, 9.5 mmHg. The mean mitral valve gradient is 3.0 mmHg with average heart rate of 66 bpm. Tricuspid Valve: The tricuspid valve is normal in structure. Tricuspid valve regurgitation is trivial. No evidence of tricuspid stenosis. Aortic Valve: Mixed valve disease. Central aortic regurgitation, normal LV stroke volume index. The aortic valve is calcified. Aortic valve regurgitation is mild to moderate. Aortic regurgitation PHT measures 651 msec. Mild aortic stenosis is present. Aortic valve mean gradient measures 14.0 mmHg. Aortic valve peak gradient measures 21.9 mmHg. Aortic valve area, by VTI measures 2.18 cm. Pulmonic Valve: The pulmonic valve was not well  visualized. Pulmonic valve regurgitation is not visualized. Aorta: The aortic root and ascending aorta are structurally normal, with no evidence of dilitation. Venous: The inferior vena cava is normal in size with greater than 50% respiratory variability, suggesting right atrial pressure of 3 mmHg. IAS/Shunts: No atrial level shunt detected by color flow Doppler.  LEFT VENTRICLE PLAX 2D LVIDd:         4.10 cm      Diastology LVIDs:         2.70 cm      LV e' medial:    5.11 cm/s LV PW:         1.30 cm      LV E/e' medial:  19.4 LV IVS:        1.15 cm      LV e' lateral:   7.40 cm/s LVOT diam:     2.10 cm      LV E/e' lateral: 13.4 LV SV:         108 LV SV Index:   57 LVOT Area:     3.46 cm  LV Volumes (MOD) LV vol d, MOD A2C: 97.7 ml LV vol d, MOD A4C: 134.0 ml LV vol s, MOD A2C: 26.3 ml LV vol s, MOD A4C: 48.9 ml LV SV MOD A2C:     71.4  ml LV SV MOD A4C:     134.0 ml LV SV MOD BP:      77.7 ml RIGHT VENTRICLE             IVC RV S prime:     13.70 cm/s  IVC diam: 1.80 cm TAPSE (M-mode): 2.0 cm LEFT ATRIUM             Index        RIGHT ATRIUM           Index LA diam:        3.90 cm 2.05 cm/m   RA Area:     21.00 cm LA Vol (A2C):   65.0 ml 34.15 ml/m  RA Volume:   66.00 ml  34.68 ml/m LA Vol (A4C):   53.2 ml 27.95 ml/m LA Biplane Vol: 61.3 ml 32.21 ml/m  AORTIC VALVE AV Area (Vmax):    2.06 cm AV Area (Vmean):   1.85 cm AV Area (VTI):     2.18 cm AV Vmax:           234.00 cm/s AV Vmean:          181.000 cm/s AV VTI:            0.495 m AV Peak Grad:      21.9 mmHg AV Mean Grad:      14.0 mmHg LVOT Vmax:         139.00 cm/s LVOT Vmean:        96.800 cm/s LVOT VTI:          0.312 m LVOT/AV VTI ratio: 0.63 AI PHT:            651 msec  AORTA Ao Root diam: 3.20 cm Ao Asc diam:  3.60 cm MITRAL VALVE                TRICUSPID VALVE MV Area (PHT): 2.76 cm     TR Peak grad:   16.0 mmHg MV Area VTI:   2.22 cm     TR Vmax:        200.00 cm/s MV Peak grad:  9.5 mmHg MV Mean grad:  3.0 mmHg     SHUNTS MV Vmax:        1.54 m/s     Systemic VTI:  0.31 m MV Vmean:      88.1 cm/s    Systemic Diam: 2.10 cm MV Decel Time: 275 msec MV E velocity: 99.00 cm/s MV A velocity: 127.00 cm/s MV E/A ratio:  0.78 Rudean Haskell MD Electronically signed by Rudean Haskell MD Signature Date/Time: 09/08/2022/1:09:21 PM    Final    CARDIAC CATHETERIZATION  Result Date: 09/07/2022   Ost RCA lesion is 80% stenosed.   Prox RCA lesion is 70% stenosed.   Mid LM to Dist LM lesion is 50% stenosed.   Dist LM to Ost LAD lesion is 50% stenosed.   Ost Cx lesion is 40% stenosed.   Prox Cx lesion is 60% stenosed.   Mid LAD lesion is 60% stenosed.   A stent was successfully placed.   Post intervention, there is a 0% residual stenosis.   Post intervention, there is a 0% residual stenosis. 1.  Thrombotic occlusion of right coronary artery treated with 4 overlapping drug-eluting stents. 2.  Distal left main, ostial LAD, and ostial left circumflex disease. 3.  LVEDP of 11 mmHg. Recommendation: The results were discussed with Dr. Ellyn Hack.  The patient will be admitted to the hospital, medical  therapy will be pursued as well as an echocardiogram.  Will follow-up as an outpatient regarding treatment for residual disease.   DG Chest 2 View  Result Date: 08/22/2022 CLINICAL DATA:  Questionable sepsis.  Multiple pulmonary metastases. EXAM: CHEST - 2 VIEW COMPARISON:  CT abdomen and pelvis no contrast 08/18/2022, CT chest with contrast 07/15/2022 FINDINGS: Heart size and vasculature are normal apart from calcification in the transverse aorta. There is a right chest port newly noted with IJ approach catheter terminating in the distal SVC. There are numerous nodular pulmonary metastases predominating in the lower zones. Largest on the left is in the upper lobe measuring 1.7 cm. Largest on the right is 2 cm in the middle lobe. Again noted is a small left pleural effusion. No focal pneumonia is seen. The right sulci are sharp. There are degenerative changes  and mild kyphodextroscoliosis of the thoracic spine with osteopenia. No thoracic compression fractures. IMPRESSION: 1. Numerous nodular pulmonary metastases. 2. Small left pleural effusion. 3. No evidence of pneumonia or edema. 4. Aortic atherosclerosis. 5. Right chest port newly noted. Electronically Signed   By: Telford Nab M.D.   On: 08/22/2022 00:34   CT ABDOMEN PELVIS WO CONTRAST  Result Date: 08/18/2022 CLINICAL DATA:  Abdominal pain, acute, nonlocalized EXAM: CT ABDOMEN AND PELVIS WITHOUT CONTRAST TECHNIQUE: Multidetector CT imaging of the abdomen and pelvis was performed following the standard protocol without IV contrast. RADIATION DOSE REDUCTION: This exam was performed according to the departmental dose-optimization program which includes automated exposure control, adjustment of the mA and/or kV according to patient size and/or use of iterative reconstruction technique. COMPARISON:  PET-CT 07/15/2022 FINDINGS: Lower chest: Stable small left pleural effusion. Coronary calcifications. Innumerable pulmonary nodules. Largest 2.8 cm in the right middle lobe, previously 2.4 cm. Largest left nodule in the anterior basal segment left lower lobe image 20/3 2.1 cm, previously 1.9. Hepatobiliary: No focal liver abnormality is seen. No gallstones, gallbladder wall thickening, or biliary dilatation. Pancreas: Diffuse parenchymal atrophy without mass or ductal dilatation. No regional inflammatory change. Spleen: Normal in size without focal abnormality. Adrenals/Urinary Tract: No adrenal mass. Left nephrolithiasis, largest stone 3 mm midpole. There is mild bilateral hydronephrosis right greater than left and ureterectasis down to thick-walled urinary bladder, partially decompressed by Foley catheter. Stomach/Bowel: Stomach is nondistended. Small bowel decompressed. Normal appendix. Colon is partially distended by gas and fecal material, with scattered distal descending and sigmoid diverticula; no adjacent  inflammatory change. Vascular/Lymphatic: Moderate scattered calcified aortoiliac calcified plaque without aneurysm. Retroaortic left renal vein, anatomic variant. No abdominal or pelvic adenopathy. Reproductive: Prostate enlargement with coarse calcifications Other: No ascites.  No free air. Musculoskeletal: Mild lumbar dextroscoliosis apex L3 with multilevel spondylitic change. Sclerotic lesions in bilateral inferior ischiopubic rami, left sacrum. For no fracture or acute process. IMPRESSION: 1. Mild bilateral hydronephrosis and ureterectasis down to thick-walled urinary bladder, partially decompressed by Foley catheter. 2. Right nephrolithiasis. 3. Innumerable pulmonary nodules, some of which have increased in size. 4. Stable sacral and pelvic bone lesions. 5. Stable small left pleural effusion. 6. Descending and sigmoid diverticulosis. 7.  Aortic Atherosclerosis (ICD10-I70.0). Electronically Signed   By: Lucrezia Europe M.D.   On: 08/18/2022 17:52

## 2022-09-11 NOTE — Progress Notes (Signed)
Subjective: No new complaints   Antibiotics:  Anti-infectives (From admission, onward)    Start     Dose/Rate Route Frequency Ordered Stop   09/10/22 0100  ampicillin (OMNIPEN) 2 g in sodium chloride 0.9 % 100 mL IVPB        2 g 300 mL/hr over 20 Minutes Intravenous Every 4 hours 09/10/22 0007     09/09/22 1200  cefTRIAXone (ROCEPHIN) 2 g in sodium chloride 0.9 % 100 mL IVPB  Status:  Discontinued        2 g 200 mL/hr over 30 Minutes Intravenous Every 24 hours 09/09/22 1101 09/10/22 0007       Medications: Scheduled Meds:  sodium chloride   Intravenous Once   aspirin  81 mg Oral Daily   Chlorhexidine Gluconate Cloth  6 each Topical Daily   insulin aspart  0-5 Units Subcutaneous QHS   insulin aspart  0-9 Units Subcutaneous TID WC   melatonin  3 mg Oral QHS   metoprolol tartrate  12.5 mg Oral BID   potassium chloride  40 mEq Oral Q4H   rosuvastatin  40 mg Oral Daily   senna-docusate  1 tablet Oral Daily   sodium chloride flush  3 mL Intravenous Q12H   tamsulosin  0.4 mg Oral BID   Thrombi-Pad  1 each Topical Once   ticagrelor  90 mg Oral BID   Continuous Infusions:  sodium chloride 10 mL/hr at 09/11/22 0824   sodium chloride     ampicillin (OMNIPEN) IV 300 mL/hr at 09/11/22 1200   PRN Meds:.sodium chloride, acetaminophen, fentaNYL (SUBLIMAZE) injection, ondansetron (ZOFRAN) IV, mouth rinse, sodium chloride flush    Objective: Weight change:   Intake/Output Summary (Last 24 hours) at 09/11/2022 1403 Last data filed at 09/11/2022 1200 Gross per 24 hour  Intake 2014.67 ml  Output 3170 ml  Net -1155.33 ml   Blood pressure 105/64, pulse 77, temperature 99.5 F (37.5 C), temperature source Oral, resp. rate (!) 21, height 5\' 9"  (1.753 m), weight 76.7 kg, SpO2 97 %. Temp:  [98.4 F (36.9 C)-100.2 F (37.9 C)] 99.5 F (37.5 C) (03/15 1347) Pulse Rate:  [67-87] 77 (03/15 1347) Resp:  [11-21] 21 (03/15 1347) BP: (88-128)/(56-76) 105/64 (03/15 1347) SpO2:   [94 %-100 %] 97 % (03/15 1347) Weight:  [76.7 kg] 76.7 kg (03/15 0600)  Physical Exam: Physical Exam Constitutional:      Appearance: He is well-developed.  HENT:     Head: Normocephalic and atraumatic.  Eyes:     Conjunctiva/sclera: Conjunctivae normal.  Cardiovascular:     Rate and Rhythm: Normal rate and regular rhythm.     Heart sounds: Murmur heard.  Pulmonary:     Effort: Pulmonary effort is normal. No respiratory distress.     Breath sounds: Normal breath sounds. No stridor. No wheezing.  Abdominal:     General: There is no distension.     Palpations: Abdomen is soft.  Musculoskeletal:        General: Normal range of motion.     Cervical back: Normal range of motion and neck supple.  Skin:    General: Skin is warm and dry.     Findings: No erythema or rash.  Neurological:     General: No focal deficit present.     Mental Status: He is alert and oriented to person, place, and time.  Psychiatric:        Mood and Affect: Mood normal.  Behavior: Behavior normal.        Thought Content: Thought content normal.        Judgment: Judgment normal.      CBC:    BMET Recent Labs    09/10/22 0815 09/11/22 0302  NA 128* 128*  K 3.6 3.4*  CL 100 103  CO2 20* 19*  GLUCOSE 120* 101*  BUN 19 20  CREATININE 1.00 0.98  CALCIUM 8.2* 7.9*     Liver Panel  No results for input(s): "PROT", "ALBUMIN", "AST", "ALT", "ALKPHOS", "BILITOT", "BILIDIR", "IBILI" in the last 72 hours.     Sedimentation Rate No results for input(s): "ESRSEDRATE" in the last 72 hours. C-Reactive Protein No results for input(s): "CRP" in the last 72 hours.  Micro Results: Recent Results (from the past 720 hour(s))  Urine Culture (for pregnant, neutropenic or urologic patients or patients with an indwelling urinary catheter)     Status: None   Collection Time: 08/18/22  4:41 PM   Specimen: Urine, Catheterized  Result Value Ref Range Status   Specimen Description   Final    URINE,  CATHETERIZED Performed at [facility] [address] [place], [place] [address]    Special Requests   Final    Immunocompromised Performed at [facility], [place] [address] [place], [place] [address]    Culture   Final    NO GROWTH Performed at [facility], [place] [address], [place], [place] [address]    Report Status 08/19/2022 FINAL  Final  Culture, blood (routine x 2)     Status: None   Collection Time: 08/18/22  6:45 PM   Specimen: BLOOD  Result Value Ref Range Status   Specimen Description   Final    BLOOD RIGHT ANTECUBITAL Performed at [facility] [address], [place], [place] [address]    Special Requests   Final    BOTTLES DRAWN AEROBIC AND ANAEROBIC Blood Culture adequate volume Performed at [facility] [address] [place], [place] [address]    Culture   Final    NO GROWTH 5 DAYS Performed at [facility], [place] [address], [place], [place] [address]    Report Status 08/23/2022 FINAL  Final  Culture, blood (routine x 2)     Status: None   Collection Time: 08/18/22  6:55 PM   Specimen: BLOOD  Result Value Ref Range Status   Specimen Description   Final    BLOOD LEFT ANTECUBITAL Performed at [facility] [address] [place], [place] [address]    Special Requests   Final    BOTTLES DRAWN AEROBIC AND ANAEROBIC Blood Culture results may not be optimal due to an excessive volume of blood received in culture bottles Performed at [facility] [address] [place], [place] [address]    Culture   Final    NO GROWTH 5 DAYS Performed at [facility], [place] [address] [place], [place] [address]    Report Status 08/23/2022 FINAL  Final  Blood Culture (routine x 2)     Status: None   Collection Time: 08/21/22 11:40 PM   Specimen: BLOOD  Result Value Ref Range Status   Specimen Description   Final    BLOOD BLOOD LEFT  FOREARM Performed at [facility] [address], [place], [place] [address]    Special Requests   Final    BOTTLES DRAWN AEROBIC AND  ANAEROBIC Blood Culture adequate volume Performed at [facility] [address], [place], [place] [address]    Culture   Final    NO GROWTH 5 DAYS Performed at [facility], [place] [address] [place], [place] [address]    Report Status 08/27/2022 FINAL  Final  Blood Culture (routine x 2)     Status: None   Collection Time: 08/21/22 11:51 PM   Specimen: BLOOD  Result Value Ref Range Status   Specimen Description   Final    BLOOD BLOOD LEFT FOREARM Performed at [facility] [address], [place], [place] [address]    Special Requests   Final    BOTTLES DRAWN AEROBIC AND ANAEROBIC Blood Culture adequate volume Performed at [facility] [address], [place], [place] [address]    Culture   Final    NO GROWTH 5 DAYS Performed at [facility], [place] [address] [place], [place] [address]    Report Status 08/27/2022 FINAL  Final  Resp panel by RT-PCR (RSV, Flu A&B, Covid) Anterior Nasal Swab     Status: None   Collection Time: 08/22/22  1:11 AM   Specimen: Anterior Nasal Swab  Result Value Ref Range Status   SARS Coronavirus 2 by RT PCR NEGATIVE NEGATIVE Final    Comment: (NOTE) SARS-CoV-2 target nucleic acids are NOT DETECTED.  The SARS-CoV-2 RNA is generally detectable in upper respiratory specimens during the acute phase of infection. The lowest concentration of SARS-CoV-2 viral copies this assay can detect is 138 copies/mL. A negative result does not preclude SARS-Cov-2 infection and should not be used as the sole basis for treatment or other patient management decisions. A negative result may occur with  improper specimen collection/handling, submission of specimen other than nasopharyngeal swab, presence of viral mutation(s) within the areas targeted  by this assay, and inadequate number of viral copies(<138 copies/mL). A negative result must be combined with clinical observations, patient history, and epidemiological information. The expected result is Negative.  Fact Sheet for Patients:  EntrepreneurPulse.com.au  Fact Sheet for Healthcare Providers:  IncredibleEmployment.be  This test is no t yet approved or cleared by the [place] FDA and  has been authorized for detection and/or diagnosis of SARS-CoV-2 by FDA under an Emergency Use Authorization (EUA). This EUA will remain  in effect (meaning this test can be used) for the duration of the COVID-19 declaration under Section 564(b)(1) of the Act, 21 U.S.C.section 360bbb-3(b)(1), unless the authorization is terminated  or revoked sooner.       Influenza A by PCR NEGATIVE NEGATIVE Final   Influenza B by PCR NEGATIVE NEGATIVE Final    Comment: (NOTE) The Xpert Xpress SARS-CoV-2/FLU/RSV plus assay is intended as an aid in the diagnosis of influenza from Nasopharyngeal swab specimens and should not be used as a sole basis for treatment. Nasal washings and aspirates are unacceptable for Xpert Xpress SARS-CoV-2/FLU/RSV testing.  Fact Sheet for Patients: EntrepreneurPulse.com.au  Fact Sheet for Healthcare Providers: IncredibleEmployment.be  This test is not yet approved or cleared by the [place] FDA and has been authorized for detection and/or diagnosis of SARS-CoV-2 by FDA under an Emergency Use Authorization (EUA). This EUA will remain in effect (meaning this test can be used) for the duration of the COVID-19 declaration under Section 564(b)(1) of the Act, 21 U.S.C. section 360bbb-3(b)(1), unless the authorization is terminated or revoked.     Resp Syncytial Virus by PCR NEGATIVE NEGATIVE Final  Comment: (NOTE) Fact Sheet for Patients: EntrepreneurPulse.com.au  Fact  Sheet for Healthcare Providers: IncredibleEmployment.be  This test is not yet approved or cleared by the [place] FDA and has been authorized for detection and/or diagnosis of SARS-CoV-2 by FDA under an Emergency Use Authorization (EUA). This EUA will remain in effect (meaning this test can be used) for the duration of the COVID-19 declaration under Section 564(b)(1) of the Act, 21 U.S.C. section 360bbb-3(b)(1), unless the authorization is terminated or revoked.  Performed at [facility], [place] [address] [place], [place] [address]   Urine Culture (for pregnant, neutropenic or urologic patients or patients with an indwelling urinary catheter)     Status: None   Collection Time: 08/22/22  1:16 AM   Specimen: Urine, Clean Catch  Result Value Ref Range Status   Specimen Description   Final    URINE, CLEAN CATCH Performed at [facility], [place] [address] [place], [place] [address]    Special Requests   Final    NONE Performed at [facility], [place] [address], [place], [place] [address]    Culture   Final    NO GROWTH Performed at [facility], [place] [address], [place], [place] [address]    Report Status 08/23/2022 FINAL  Final  Urine Culture (for pregnant, neutropenic or urologic patients or patients with an indwelling urinary catheter)     Status: Abnormal   Collection Time: 09/09/22  9:10 AM   Specimen: Urine, Catheterized  Result Value Ref Range Status   Specimen Description URINE, CATHETERIZED  Final   Special Requests   Final    Immunocompromised Performed at [facility], [place] [address], [place], [place] [address]    Culture >=100,000 COLONIES/mL ENTEROCOCCUS FAECALIS (A)  Final   Report Status 09/11/2022 FINAL  Final   Organism ID, Bacteria ENTEROCOCCUS FAECALIS (A)  Final      Susceptibility   Enterococcus faecalis - MIC*    AMPICILLIN <=2 SENSITIVE Sensitive     NITROFURANTOIN  <=16 SENSITIVE Sensitive     VANCOMYCIN 1 SENSITIVE Sensitive     LINEZOLID 2 SENSITIVE Sensitive     * >=100,000 COLONIES/mL ENTEROCOCCUS FAECALIS  Culture, blood (Routine X 2) w Reflex to ID Panel     Status: Abnormal   Collection Time: 09/09/22  9:32 AM   Specimen: BLOOD RIGHT ARM  Result Value Ref Range Status   Specimen Description BLOOD RIGHT ARM  Final   Special Requests   Final    BOTTLES DRAWN AEROBIC AND ANAEROBIC Blood Culture adequate volume   Culture  Setup Time   Final    GRAM POSITIVE COCCI IN CHAINS IN BOTH AEROBIC AND ANAEROBIC BOTTLES Organism ID to follow CRITICAL RESULT CALLED TO, READ BACK BY AND VERIFIED WITH: T RUDISILL,[email or web address]  09/09/22 [place] Performed at [facility], [place] [address], [place], [place] [address]    Culture ENTEROCOCCUS FAECALIS (A)  Final   Report Status 09/11/2022 FINAL  Final   Organism ID, Bacteria ENTEROCOCCUS FAECALIS  Final      Susceptibility   Enterococcus faecalis - MIC*    AMPICILLIN <=2 SENSITIVE Sensitive     VANCOMYCIN 1 SENSITIVE Sensitive     GENTAMICIN SYNERGY SENSITIVE Sensitive     LINEZOLID 2 SENSITIVE Sensitive     * ENTEROCOCCUS FAECALIS  Blood Culture ID Panel (Reflexed)     Status: Abnormal   Collection Time: 09/09/22  9:32 AM  Result Value Ref Range  Status   Enterococcus faecalis DETECTED (A) NOT DETECTED Final    Comment: CRITICAL RESULT CALLED TO, READ BACK BY AND VERIFIED WITH: T RUDISILL,[email or web address]  09/09/22 [place]    Enterococcus Faecium NOT DETECTED NOT DETECTED Final   Listeria monocytogenes NOT DETECTED NOT DETECTED Final   Staphylococcus species NOT DETECTED NOT DETECTED Final   Staphylococcus aureus (BCID) NOT DETECTED NOT DETECTED Final   Staphylococcus epidermidis NOT DETECTED NOT DETECTED Final   Staphylococcus lugdunensis NOT DETECTED NOT DETECTED Final   Streptococcus species NOT DETECTED NOT DETECTED Final   Streptococcus agalactiae NOT DETECTED NOT DETECTED Final   Streptococcus pneumoniae  NOT DETECTED NOT DETECTED Final   Streptococcus pyogenes NOT DETECTED NOT DETECTED Final   A.calcoaceticus-baumannii NOT DETECTED NOT DETECTED Final   Bacteroides fragilis NOT DETECTED NOT DETECTED Final   Enterobacterales NOT DETECTED NOT DETECTED Final   Enterobacter cloacae complex NOT DETECTED NOT DETECTED Final   Escherichia coli NOT DETECTED NOT DETECTED Final   Klebsiella aerogenes NOT DETECTED NOT DETECTED Final   Klebsiella oxytoca NOT DETECTED NOT DETECTED Final   Klebsiella pneumoniae NOT DETECTED NOT DETECTED Final   Proteus species NOT DETECTED NOT DETECTED Final   Salmonella species NOT DETECTED NOT DETECTED Final   Serratia marcescens NOT DETECTED NOT DETECTED Final   Haemophilus influenzae NOT DETECTED NOT DETECTED Final   Neisseria meningitidis NOT DETECTED NOT DETECTED Final   Pseudomonas aeruginosa NOT DETECTED NOT DETECTED Final   Stenotrophomonas maltophilia NOT DETECTED NOT DETECTED Final   Candida albicans NOT DETECTED NOT DETECTED Final   Candida auris NOT DETECTED NOT DETECTED Final   Candida glabrata NOT DETECTED NOT DETECTED Final   Candida krusei NOT DETECTED NOT DETECTED Final   Candida parapsilosis NOT DETECTED NOT DETECTED Final   Candida tropicalis NOT DETECTED NOT DETECTED Final   Cryptococcus neoformans/gattii NOT DETECTED NOT DETECTED Final   Vancomycin resistance NOT DETECTED NOT DETECTED Final    Comment: Performed at [facility], [address], [place], [place] [address]  Culture, blood (Routine X 2) w Reflex to ID Panel     Status: Abnormal   Collection Time: 09/09/22  9:34 AM   Specimen: BLOOD RIGHT ARM  Result Value Ref Range Status   Specimen Description BLOOD RIGHT ARM  Final   Special Requests   Final    BOTTLES DRAWN AEROBIC AND ANAEROBIC Blood Culture adequate volume   Culture  Setup Time   Final    GRAM POSITIVE COCCI IN BOTH AEROBIC AND ANAEROBIC BOTTLES CRITICAL VALUE NOTED.  VALUE IS CONSISTENT WITH PREVIOUSLY REPORTED AND  CALLED VALUE.    Culture (A)  Final    ENTEROCOCCUS FAECALIS SUSCEPTIBILITIES PERFORMED ON PREVIOUS CULTURE WITHIN THE LAST 5 DAYS. Performed at [facility], [place] [address] [place], [place] [address]    Report Status 09/11/2022 FINAL  Final  Culture, blood (Routine X 2) w Reflex to ID Panel     Status: None (Preliminary result)   Collection Time: 09/10/22  8:15 AM   Specimen: BLOOD  Result Value Ref Range Status   Specimen Description BLOOD RIGHT ANTECUBITAL  Final   Special Requests   Final    BOTTLES DRAWN AEROBIC AND ANAEROBIC Blood Culture adequate volume   Culture   Final    NO GROWTH < 24 HOURS Performed at [facility], [place] [address] [place], [place] [address]    Report Status PENDING  Incomplete  Culture, blood (Routine X 2) w Reflex to ID Panel  Status: None (Preliminary result)   Collection Time: 09/10/22  8:19 AM   Specimen: BLOOD  Result Value Ref Range Status   Specimen Description BLOOD RIGHT ANTECUBITAL  Final   Special Requests   Final    BOTTLES DRAWN AEROBIC AND ANAEROBIC Blood Culture adequate volume   Culture   Final    NO GROWTH < 24 HOURS Performed at [facility], [address], [place], [place] [address]    Report Status PENDING  Incomplete    Studies/Results: IR REMOVAL TUN ACCESS W/ PORT W/O FL MOD SED  Result Date: 09/10/2022 INDICATION: 77 year old with Enterococcus bacteremia. Request for Port-A-Cath removal. EXAM: REMOVAL RIGHT IJ VEIN PORT-A-CATH MEDICATIONS: Versed 1 mg ANESTHESIA/SEDATION: Patient was monitored by a radiology nurse throughout the procedure. FLUOROSCOPY: None COMPLICATIONS: None immediate. PROCEDURE: Informed written consent was obtained from the patient after a thorough discussion of the procedural risks, benefits and alternatives. All questions were addressed. Maximal Sterile Barrier Technique was utilized including caps, mask, sterile gowns, sterile gloves, sterile drape, hand hygiene and skin  antiseptic. A timeout was performed prior to the initiation of the procedure. The right chest was prepped and draped in a sterile fashion. Lidocaine was utilized for local anesthesia. An incision was made over the previously healed surgical incision. Utilizing blunt dissection, the port catheter and reservoir were removed from the underlying subcutaneous tissue in their entirety. The pocket was irrigated with a copious amount of sterile normal saline. The subcutaneous tissue was closed with 3-0 Vicryl interrupted subcutaneous stitches. A 4-0 Vicryl running subcuticular stitch was utilized to approximate the skin. Dermabond was applied. FINDINGS: Port was completely removed. Port pocket appeared healthy without fluid or drainage. IMPRESSION: Successful right IJ vein Port-A-Cath explant. Electronically Signed   By: Markus Daft M.D.   On: 09/10/2022 21:19      Assessment/Plan:  INTERVAL HISTORY: Port has been removed   Principal Problem:   Enterococcal bacteremia Active Problems:   Hyperlipidemia with target LDL less than 70   Prediabetes   Cerebrovascular disease   Indwelling urinary catheter present   Acute ST elevation myocardial infarction (STEMI) of inferior wall (HCC)   Coronary artery disease involving native coronary artery of native heart with unstable angina pectoris ([place])   Left main coronary artery disease   Hematuria   Fever   S/P coronary artery stent placement   Port or reservoir infection    BRENTT CAVER is a 77 y.o. 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 Source is suspected urinary with Enterococcus faecalis growing 100,000 colony-forming units from urine and also in his blood.  He has now had his port removed which will help [place] achieve cure.  I do not think he needs to have a TEE given the abrupt  onset of his bacteremia.  I am repeating blood cultures now that his port is out I would plan on giving him a 2-week course of ampicillin likely with a continuous infusion at home.  I spent 52 minutes with the patient including than 50% of the time in face to face counseling of the patient regarding his enterococcal bacteremia, port that need to be removed, along with review of medical records in preparation for the visit and during the visit and in coordination of his care with oncology, cardiology.  My partner  Dr. Juleen China is available this weekend for questions  and I will be back on Monday.   LOS: 4 days   Alcide Evener 09/11/2022, 2:03 PM

## 2022-09-11 NOTE — Progress Notes (Signed)
S: Larry Kent is feeling tired today.  He is reluctant to perform a voiding trial.  O: Vitals:   09/11/22 0600 09/11/22 0700  BP: 117/66 127/75  Pulse: 67 83  Resp: 18 (!) 21  Temp:    SpO2: 99% 100%    Intake/Output Summary (Last 24 hours) at 09/11/2022 [id number] Last data filed at 09/11/2022 0700 Gross per 24 hour  Intake 851.63 ml  Output 2935 ml  Net -2083.37 ml   He is sitting in a chair, alert and oriented Foley cath in place, urine clear  Lab Results  Component Value Date   CREATININE 0.98 09/11/2022   CREATININE 1.00 09/10/2022   CREATININE 1.00 09/09/2022   A/P:  1) urinary retention-he did well in the office Monday with a voiding trial but then developed MI and admission.  It is difficult to tell if he was truly in retention again.  He did not have painful inability to urinate but evidently a bladder scan was detecting a full bladder.  Coud was placed without difficulty.  Will continue to monitor and consider for void trial in another day or 2 in the morning or will get him back in the office for another voiding trial.  [name] should help shrink the prostate and promote better chances of success.  2) metastatic prostate cancer-chemotherapy per Dr. Julien Nordmann.  He was given [name] to begin ADT on August 26, 2022.  Funding for [employer] in process.   3) enterococcal UTI and bacteremia-continue antibiotics per primary team.  Will cover with antibiotics again at time of Foley removal if his course is complete.

## 2022-09-11 NOTE — Progress Notes (Addendum)
CARDIOLOGY ROUNDING PROGRESS NOTE  Patient Name: Larry Kent Date of Encounter: 09/11/2022  [facility] Cardiologist: Kathlyn Sacramento, MD   Patient Profile     76 y.o. male with a PHX of metastatic prostate cancer, lung nodules concerning for neuroendocrine carcinoma on biopsy undergoing palliative chemo/immunotherapy, carotid disease s/p carotid endarterectomy in 2021 for severe, asymptomatic stenosis, HLD presenting from oncologist office to [facility] ED for chest pain evaluation, found to have an inferior MI  on EKG, subsequently transferred to [facility] s/p cardiac catheterization with stent placement in the RCA, with febrile illness of post PCI day 2, now with enterococcus faecalis bacteremia; Also noted to have dropping WBC & Hgb & Na+.   Principal Problem:   Enterococcal bacteremia Active Problems:   Hyperlipidemia with target LDL less than 70   Prediabetes   Cerebrovascular disease   Indwelling urinary catheter present   Acute ST elevation myocardial infarction (STEMI) of inferior wall (HCC)   Coronary artery disease involving native coronary artery of native heart with unstable angina pectoris (HCC)   Left main coronary artery disease   Hematuria   Fever   S/P coronary artery stent placement   Port or reservoir infection Hyponatremia Anemia of Chronic Disease  Subjective   Chief Complaint: Chest tightness and SOB with exertion  found to have inferior STEMI  O/N: s/p chemotherapy port removal per IR. Continues on ampicillin treatement. Febrile overnight to 100.2 F. Chills overnight. Minimal ambulation yesterday. No altered mental status. Mild chills, feeling unchaged from yesterday. Urology at bedside.   ROS:  All other ROS reviewed and negative. Pertinent positives noted in the HPI.     Inpatient Medications  Scheduled Meds:  aspirin  81 mg Oral Daily   Chlorhexidine Gluconate Cloth  6 each Topical Daily   insulin aspart  0-5 Units Subcutaneous QHS   insulin aspart   0-9 Units Subcutaneous TID WC   melatonin  3 mg Oral QHS   metoprolol tartrate  12.5 mg Oral BID   rosuvastatin  40 mg Oral Daily   sodium chloride flush  3 mL Intravenous Q12H   tamsulosin  0.4 mg Oral BID   Thrombi-Pad  1 each Topical Once   ticagrelor  90 mg Oral BID   Continuous Infusions:  sodium chloride 10 mL/hr at 09/11/22 0700   sodium chloride     ampicillin (OMNIPEN) IV Stopped (09/11/22 0349)   PRN Meds: sodium chloride, acetaminophen, fentaNYL (SUBLIMAZE) injection, ondansetron (ZOFRAN) IV, sodium chloride flush   Vital Signs    Vitals:   09/11/22 0400 09/11/22 0500 09/11/22 0600 09/11/22 0700  BP: 104/67 119/66 117/66 127/75  Pulse: 69 76 67 83  Resp: 14 16 18  (!) 21  Temp:      TempSrc:      SpO2: 97% 99% 99% 100%  Weight:      Height:        Intake/Output Summary (Last 24 hours) at 09/11/2022 0723 Last data filed at 09/11/2022 0700 Gross per 24 hour  Intake 958.21 ml  Output 2935 ml  Net -1976.79 ml      09/09/2022    6:00 AM 09/08/2022    6:05 AM 09/07/2022   12:16 PM  Last 3 Weights  Weight (lbs) 163 lb 9.3 oz 164 lb 14.5 oz 184 lb  Weight (kg) 74.2 kg 74.8 kg 83.462 kg      Telemetry  Overnight telemetry shows NSR which I personally reviewed.   Physical Exam   General: Elderly man in  NAD; frail / chronically ill appearing Head: Atraumatic, normal size  Eyes: PEERLA, EOMI, MM  Neck: Supple, no JVD Endocrine: No thryomegaly Cardiac: RRR, 1/6 harsh SEM at RUSB-carotid  Lungs: Clear to auscultation bilaterally, no wheezing, rhonchi or rales  Abd: Soft, nontender, no hepatomegaly, no suprapubic tenderness Ext: No edema. R radial catheter access with resolving mild ecchymosis up to R antecubital area without edema. Normal Allen's and reverse Barbeau's test today. R femoral cath access site with unchanged mild ecchymoses and mild petechial spots close to site, without edema. PT and DP pulses present, without bruit or tenderness. Musculoskeletal: No  deformities, BUE and BLE strength normal and equal Skin: R anterior chemotherapy port removal site covered with bandage - no drainage or surrounding erythema  Otherwise, skin is warm and dry, no rashes   Neuro: Alert and oriented to person, place, time, and situation, responding appropriately , no focal neurological deficits Psych: Normal mood with flat affect, engaged in conversation GU: no suprapubic tenderness, foley cath in place   Left chest wall skin tear; mld petechial echymoses in R groin bu no dramatic bruising. @ cath site.   Labs    High Sensitivity Troponin:   Recent Labs  Lab 09/07/22 1225 09/07/22 1639  TROPONINIHS 3,532* 7,318*     Chemistry Recent Labs  Lab 09/07/22 1111 09/07/22 1225 09/09/22 0740 09/10/22 0815 09/11/22 0302  NA 136   < > 129* 128* 128*  K 4.3   < > 3.8 3.6 3.4*  CL 104   < > 99 100 103  CO2 24   < > 17* 20* 19*  GLUCOSE 160*   < > 113* 120* 101*  BUN 24*   < > 23 19 20   CREATININE 0.88   < > 1.00 1.00 0.98  CALCIUM 9.1   < > 8.4* 8.2* 7.9*  PROT 6.8  --   --   --   --   ALBUMIN 3.7  --   --   --   --   AST 39  --   --   --   --   ALT 54*  --   --   --   --   ALKPHOS 160*  --   --   --   --   BILITOT 0.6  --   --   --   --   GFRNONAA >60   < > >60 >60 >60  ANIONGAP 8   < > 13 8 6    < > = values in this interval not displayed.    Lipids  Recent Labs  Lab 09/07/22 1323  CHOL 137  TRIG 55  HDL 62  LDLCALC 64  CHOLHDL 2.2    Hematology Recent Labs  Lab 09/09/22 0932 09/10/22 0815 09/11/22 0302  WBC 3.8* 3.0* 2.0*  RBC 2.34* 2.34* 2.12*  HGB 8.3* 8.2* 7.4*  HCT 23.4* 23.1* 21.6*  MCV 100.0 98.7 101.9*  MCH 35.5* 35.0* 34.9*  MCHC 35.5 35.5 34.3  RDW 13.7 13.6 14.0  PLT 182 124* 100*     Thyroid No results for input(s): "TSH", "FREET4" in the last 168 hours.  BNPNo results for input(s): "BNP", "PROBNP" in the last 168 hours.  DDimer No results for input(s): "DDIMER" in the last 168 hours.    Microbiology: Culture: gram positive cocci BCID: Enterococcus faecalils - awaiting sensitivities    Radiology    DG Chest Port 1 View  Result Date: 09/09/2022 CLINICAL DATA:  Tachycardia EXAM: PORTABLE CHEST 1  VIEW COMPARISON:  CXR 08/22/22 FINDINGS: No pleural effusion. No pneumothorax. Right chest port with tip near the cavoatrial junction. Compared to prior exam there are new patchy airspace opacities at the bilateral lung bases, which could represent atelectasis or infection. No radiographically apparent displaced rib fractures. Visualized upper abdomen is unremarkable. IMPRESSION: Compared to prior exam there are new patchy airspace opacities at the bilateral lung bases, which could represent atelectasis or infection. Electronically Signed   By: Marin Roberts M.D.   On: 09/09/2022 09:55   Cardiac Studies - Summarized   09/07/2021 ECHOCARDIOGRAM  IMPRESSIONS   1. Left ventricular ejection fraction, by estimation, is 65 to 70%. The left ventricle has normal function. The left ventricle demonstrates regional wall motion abnormalities:The mid and distal anterior septum and mid inferoseptal segment are  hypokinetic. There is moderate concentric left ventricular hypertrophy. Left ventricular diastolic parameters are indeterminate.   2. Right ventricular systolic function is normal. The right ventricular size is moderately enlarged. There is normal pulmonary artery systolic pressure.   3. Right atrial size was mildly dilated.   4. The mitral valve is degenerative. No evidence of mitral valve regurgitation. No evidence of mitral stenosis. The mean mitral valve gradient is 3.0 mmHg with average heart rate of 66 bpm. Severe mitral annular calcification.   5. Mixed valve disease. Central aortic regurgitation, normal LV stroke volume index. The aortic valve is calcified. Aortic valve regurgitation is mild to moderate. Mild aortic valve stenosis. Aortic valve mean gradient measures 14.0 mmHg.   6.  The inferior vena cava is normal in size with greater than 50% respiratory variability, suggesting right atrial pressure of 3 mmHg.   Cardiac cath-PCI RCA 09/07/2022: Diagnostic Dominance: Right     Intervention          09/23/2021 VAS US carotid duplex Summary:  Right Carotid: There is no evidence of stenosis in the right ICA.  Left Carotid: Velocities in the left ICA are consistent with a 1-39% stenosis.  Vertebrals: Right vertebral artery demonstrates antegrade flow. Left  vertebral artery demonstrates high resistant flow.  Subclavians: Normal flow hemodynamics were seen in bilateral subclavian arteries.   10/22/2020 ECHO complete LVEF 55-60%. Left ventricle with normal function. Moderate left ventricular hypertrophy. Grade II diastolic dysfunction. Elevated L atrial pressure R ventricle with normal function with mild enlargement.  Mitral valve abnormal with no evidence of MV regurgitation or evidence of mitral stenosis Mild calcification of the aortic valve. Mild thickening. Trivial AV regurgitation  Assessment & Plan   Enterococcus faecalis bacteremia E faecalis UTI Leukopenia Concern of bacterial infection prior to hospitalization; low grade fever on admission. Indwelling foley catheter prior to admission (new foley placed by Urology on 3/11) - WBC 2.0 today - ? Is this sign of impending sepsis - from infection vs. Chemo related  - S/p chemotherapy port removal 3/14 - On ampicillin Ampicillin 3/14 - day 3 -- Likely willl need foley catheter exchange -- Considering TEE, though low risk for endocarditisappreciate recommendations; Port removed yesterday per recommendations. - ID following; appreciate recommendations - Follow blood culture susceptibilities - This is primary issue keeping patient hospitalized, treatment transitioned to [facility] hospitalists  Inferior STEMI ST elevation leads II, III, and aVF S/p LHC with distal occlusion of RCA with 4x DES Distal LM, ostial left  circumflex, distal and ostial LAD stenoses not stented -TTE 3/12 with EF 65-75%, mild hypokinesis the mid inferiorseptal segment as well as the  mid and distal anterior septal segments; suspect anterior portion hypokinesis in  setting of nonstented mid LAD stenosis - Continue medical management; no plans for repeat LHC and stent placement unless patient is symptomatic - Continue ASA and Brillinta therapy for at least 12 months given s/p 4 stents and high risk for occlusion - Continue on Metoprolol tartrate 12.5 mg BID; will need conversion to Toprol when ready to discharge - Recommend stress PET scan in ~1 month to evaluate left coronary system for ischemia based on the left main, LAD and LCx disease.  And monitor for anginal symptoms to evaluate need for additional revascularization ==> anticipate this being pushed back to allow for full treatment of bacteremia. - Monitor for Brillinta side effects including dyspnea and bradycardia Would convert to Toprol on d/c - but hold off on titration due to ongoing infectious issues.   Acute on chronic anemia Thrombocytopenia  Likely in the setting of acute illness No overt signs of bleeding on exam today We are consulting Dr. Julien Nordmann (oncology) consultation given high risk for decompensation - Would consider 1 unit of blood; From a a coronary disease stand point, would like to keep Hgb >8. = would need Oncology & TRH input  Hyponatremia Hypovolemic on exam today Will order one NS bolus today -Consider repeating BMP -Consider urine studies if no improvement in hyponatremia  Metastatic Carcinoma Suspicious primary sites prostate vs lung cancer Metastasis to LN and bones Diagnosed 06/2022 Currently on the palliative chemotherapy/immunotherapy regimen (stated below) c/b pancytopenia now stable --Carboplatin for AUC of 5 on day 1 --Imfinzi 1500 Mg IV on day 1 --Etoposide 100 Mg/M2 on days 1, 2 and 3 with Neulasta support every 3 weeks status post 1  cycle.  Followed at the [facility] with Dr. Julien Nordmann; recently completed second cycle with plans to follow up imaging on 09/17/2022 --Will need to discuss further palliative treatment given increased risk of bleeding on current DAPT regimen.  -CTM platelets  Prostatic adenocarcinoma Biopsy positive, diagnosed 07/2022 Followed by Dr. Junious Silk S/p  outpatient indwelling foley catheter removal with scant gross hematuria and urinary symptoms Urine cultures now with Enterococcus faecalis, likely source of bacteremia - Will need discharge plans - Continue on Tamsulosin - Urology consulted in setting of foley needs and bacteremia; at bedside todau  Asymptomatic Aortic sclerosis Mild per 3/12 echo  Prediabetes A1c 6.4 -SSI  HLD Now on Crestor 40 mg daily  Discussed with Cardiology attending, Dr. Reece Agar, [facility] 09/11/2022, 10:09 AM  ATTENDING ATTESTATION  I have seen, examined and evaluated the patient this AM on rounds along with Dr. Simeon Craft (IM R1) .  After reviewing all the available data and chart, we discussed the patients laboratory, study & physical findings as well as symptoms in detail.  I agree with her findings, examination as well as impression recommendations as per our discussion.    Attending adjustments noted in italics.   Plan is for [facility] to take over as Primary with Cardiology as consultant given multiple Non-Cardiac issues taking the forefront.  Is on stable Cardiac regimen for now with no acute changes noted.     Leonie Man, MD, MS Glenetta Hew, M.D., M.S. Interventional Cardiologist  [place]  Pager # [phone] Phone # [phone] [address] [place], [place] [address]     For questions or updates, please contact [place] Please consult [email or web address] for contact info under

## 2022-09-11 NOTE — Progress Notes (Signed)
PROGRESS NOTE    Larry Kent  [id number] DOB: Oct 29, 1945 DOA: 09/07/2022 PCP: Ria Bush, MD  Outpatient Specialists:     Brief Narrative:  Patient is a 77 year old male with metastatic prostate cancer on palliative chemotherapy who initially presented with acute inferior wall STEMI s/p cardiac cath and stent placement. Course was complicated with UTI/sepsis due to Enterococcus faecalis bacteremia.  Patient is currently on IV ampicillin.  Input from infectious disease and urology team is appreciated.  Patient has had problems with urinary retention and, currently has Foley catheter in place.  TR[facility] assumed primary attending rate today.  09/11/2022: Patient seen.  Very pleasant patient.  Tmax is 101.3 F.  However, patient denied fever.   Assessment & Plan:   Principal Problem:   Enterococcal bacteremia Active Problems:   Hyperlipidemia with target LDL less than 70   Prediabetes   Cerebrovascular disease   Indwelling urinary catheter present   Acute ST elevation myocardial infarction (STEMI) of inferior wall (HCC)   Coronary artery disease involving native coronary artery of native heart with unstable angina pectoris (HCC)   Left main coronary artery disease   Hematuria   Fever   S/P coronary artery stent placement   Port or reservoir infection   Acute inferior STEMI s/p stent Hyperlipidemia -Cardiology is directing care.     E.faecalis bacteremia - was initially on CTX and switched over to Ampicillin. Likely urinary source though UA not impressive. Echo from 3/12 without any suggestion of vegetations Continue Ampicillin ID is following patient.  Metastatic prostate cancer to LN and bones - on palliative chemo/immunotherapy Outpatient follow-up with hematology   Prediabetes Continue SSI   DVT prophylaxis:  Code Status: Full code Family Communication:  Disposition Plan: Home eventually.   Consultants:  Cardiology Urology Infectious disease Critical  care  Procedures:  Status post PCI.  Antimicrobials:  IV ampicillin.   Subjective: No fever or chills. No chest pain. No shortness of breath.  Objective: Vitals:   09/11/22 1200 09/11/22 1300 09/11/22 1332 09/11/22 1347  BP: 119/70 114/76 (!) 99/57 105/64  Pulse: 74 80 76 77  Resp: 16 18 13  (!) 21  Temp:   98.4 F (36.9 C) 99.5 F (37.5 C)  TempSrc:   Oral Oral  SpO2: 97% 98% 98% 97%  Weight:      Height:        Intake/Output Summary (Last 24 hours) at 09/11/2022 1354 Last data filed at 09/11/2022 1200 Gross per 24 hour  Intake 2014.67 ml  Output 3170 ml  Net -1155.33 ml   Filed Weights   09/08/22 0605 09/09/22 0600 09/11/22 0600  Weight: 74.8 kg 74.2 kg 76.7 kg    Examination:  General exam: Appears calm and comfortable  Respiratory system: Clear to auscultation.  Cardiovascular system: S1 & S2 with ejection systolic murmur.  Gastrointestinal system: Abdomen is nondistended, soft and nontender. No organomegaly or masses felt. Normal bowel sounds heard. Central nervous system: Alert and oriented. No focal neurological deficits. Extremities: No leg edema.  Data Reviewed: I have personally reviewed following labs and imaging studies  CBC: Recent Labs  Lab 09/07/22 1111 09/07/22 1225 09/08/22 0020 09/09/22 0740 09/09/22 0932 09/10/22 0815 09/11/22 0302  WBC 12.9*   < > 8.1 4.3 3.8* 3.0* 2.0*  NEUTROABS 11.0*  --   --   --  3.2 2.4  --   HGB 10.0*   < > 8.8* 8.2* 8.3* 8.2* 7.4*  HCT 27.9*   < > 25.6* 22.9* 23.4* 23.1*  21.6*  MCV 98.9   < > 100.4* 99.1 100.0 98.7 101.9*  PLT 302   < > 270 183 182 124* 100*   < > = values in this interval not displayed.   Basic Metabolic Panel: Recent Labs  Lab 09/07/22 1225 09/07/22 1324 09/08/22 0020 09/09/22 0740 09/10/22 0815 09/11/22 0302  NA 136 136 134* 129* 128* 128*  K 4.4 4.0 4.4 3.8 3.6 3.4*  CL 103 104 105 99 100 103  CO2 24  --  20* 17* 20* 19*  GLUCOSE 159* 150* 135* 113* 120* 101*  BUN 19 20  24* 23 19 20   CREATININE 0.94 0.70 0.92 1.00 1.00 0.98  CALCIUM 9.0  --  8.4* 8.4* 8.2* 7.9*   GFR: Estimated Creatinine Clearance: 63.1 mL/min (by C-G formula based on SCr of 0.98 mg/dL). Liver Function Tests: Recent Labs  Lab 09/07/22 1111  AST 39  ALT 54*  ALKPHOS 160*  BILITOT 0.6  PROT 6.8  ALBUMIN 3.7   No results for input(s): "LIPASE", "AMYLASE" in the last 168 hours. No results for input(s): "AMMONIA" in the last 168 hours. Coagulation Profile: Recent Labs  Lab 09/07/22 1323  INR 1.2   Cardiac Enzymes: No results for input(s): "CKTOTAL", "CKMB", "CKMBINDEX", "TROPONINI" in the last 168 hours. BNP (last 3 results) No results for input(s): "PROBNP" in the last 8760 hours. HbA1C: No results for input(s): "HGBA1C" in the last 72 hours. CBG: Recent Labs  Lab 09/10/22 0803 09/10/22 1127 09/10/22 2249 09/11/22 0636 09/11/22 1115  GLUCAP 195* 152* 180* 92 100*   Lipid Profile: No results for input(s): "CHOL", "HDL", "LDLCALC", "TRIG", "CHOLHDL", "LDLDIRECT" in the last 72 hours. Thyroid Function Tests: No results for input(s): "TSH", "T4TOTAL", "FREET4", "T3FREE", "THYROIDAB" in the last 72 hours. Anemia Panel: No results for input(s): "VITAMINB12", "FOLATE", "FERRITIN", "TIBC", "IRON", "RETICCTPCT" in the last 72 hours. Urine analysis:    Component Value Date/Time   COLORURINE YELLOW 09/09/2022 0910   APPEARANCEUR CLEAR 09/09/2022 0910   LABSPEC 1.018 09/09/2022 0910   PHURINE 5.0 09/09/2022 0910   GLUCOSEU NEGATIVE 09/09/2022 0910   HGBUR SMALL (A) 09/09/2022 0910   BILIRUBINUR NEGATIVE 09/09/2022 0910   KETONESUR NEGATIVE 09/09/2022 0910   PROTEINUR NEGATIVE 09/09/2022 0910   NITRITE NEGATIVE 09/09/2022 0910   LEUKOCYTESUR TRACE (A) 09/09/2022 0910   Sepsis Labs: @LABRCNTIP (procalcitonin:4,lacticidven:4)  ) Recent Results (from the past 240 hour(s))  Urine Culture (for pregnant, neutropenic or urologic patients or patients with an indwelling  urinary catheter)     Status: Abnormal   Collection Time: 09/09/22  9:10 AM   Specimen: Urine, Catheterized  Result Value Ref Range Status   Specimen Description URINE, CATHETERIZED  Final   Special Requests   Final    Immunocompromised Performed at [facility], [place] [address], [place], [place] [address]    Culture >=100,000 COLONIES/mL ENTEROCOCCUS FAECALIS (A)  Final   Report Status 09/11/2022 FINAL  Final   Organism ID, Bacteria ENTEROCOCCUS FAECALIS (A)  Final      Susceptibility   Enterococcus faecalis - MIC*    AMPICILLIN <=2 SENSITIVE Sensitive     NITROFURANTOIN <=16 SENSITIVE Sensitive     VANCOMYCIN 1 SENSITIVE Sensitive     LINEZOLID 2 SENSITIVE Sensitive     * >=100,000 COLONIES/mL ENTEROCOCCUS FAECALIS  Culture, blood (Routine X 2) w Reflex to ID Panel     Status: Abnormal   Collection Time: 09/09/22  9:32 AM   Specimen: BLOOD RIGHT ARM  Result Value  Ref Range Status   Specimen Description BLOOD RIGHT ARM  Final   Special Requests   Final    BOTTLES DRAWN AEROBIC AND ANAEROBIC Blood Culture adequate volume   Culture  Setup Time   Final    GRAM POSITIVE COCCI IN CHAINS IN BOTH AEROBIC AND ANAEROBIC BOTTLES Organism ID to follow CRITICAL RESULT CALLED TO, READ BACK BY AND VERIFIED WITH: T RUDISILL,[email or web address]  09/09/22 [place] Performed at [facility], [place] [address], [place], [place] [address]    Culture ENTEROCOCCUS FAECALIS (A)  Final   Report Status 09/11/2022 FINAL  Final   Organism ID, Bacteria ENTEROCOCCUS FAECALIS  Final      Susceptibility   Enterococcus faecalis - MIC*    AMPICILLIN <=2 SENSITIVE Sensitive     VANCOMYCIN 1 SENSITIVE Sensitive     GENTAMICIN SYNERGY SENSITIVE Sensitive     LINEZOLID 2 SENSITIVE Sensitive     * ENTEROCOCCUS FAECALIS  Blood Culture ID Panel (Reflexed)     Status: Abnormal   Collection Time: 09/09/22  9:32 AM  Result Value Ref Range Status   Enterococcus faecalis DETECTED (A) NOT DETECTED Final    Comment:  CRITICAL RESULT CALLED TO, READ BACK BY AND VERIFIED WITH: T RUDISILL,[email or web address]  09/09/22 [place]    Enterococcus Faecium NOT DETECTED NOT DETECTED Final   Listeria monocytogenes NOT DETECTED NOT DETECTED Final   Staphylococcus species NOT DETECTED NOT DETECTED Final   Staphylococcus aureus (BCID) NOT DETECTED NOT DETECTED Final   Staphylococcus epidermidis NOT DETECTED NOT DETECTED Final   Staphylococcus lugdunensis NOT DETECTED NOT DETECTED Final   Streptococcus species NOT DETECTED NOT DETECTED Final   Streptococcus agalactiae NOT DETECTED NOT DETECTED Final   Streptococcus pneumoniae NOT DETECTED NOT DETECTED Final   Streptococcus pyogenes NOT DETECTED NOT DETECTED Final   A.calcoaceticus-baumannii NOT DETECTED NOT DETECTED Final   Bacteroides fragilis NOT DETECTED NOT DETECTED Final   Enterobacterales NOT DETECTED NOT DETECTED Final   Enterobacter cloacae complex NOT DETECTED NOT DETECTED Final   Escherichia coli NOT DETECTED NOT DETECTED Final   Klebsiella aerogenes NOT DETECTED NOT DETECTED Final   Klebsiella oxytoca NOT DETECTED NOT DETECTED Final   Klebsiella pneumoniae NOT DETECTED NOT DETECTED Final   Proteus species NOT DETECTED NOT DETECTED Final   Salmonella species NOT DETECTED NOT DETECTED Final   Serratia marcescens NOT DETECTED NOT DETECTED Final   Haemophilus influenzae NOT DETECTED NOT DETECTED Final   Neisseria meningitidis NOT DETECTED NOT DETECTED Final   Pseudomonas aeruginosa NOT DETECTED NOT DETECTED Final   Stenotrophomonas maltophilia NOT DETECTED NOT DETECTED Final   Candida albicans NOT DETECTED NOT DETECTED Final   Candida auris NOT DETECTED NOT DETECTED Final   Candida glabrata NOT DETECTED NOT DETECTED Final   Candida krusei NOT DETECTED NOT DETECTED Final   Candida parapsilosis NOT DETECTED NOT DETECTED Final   Candida tropicalis NOT DETECTED NOT DETECTED Final   Cryptococcus neoformans/gattii NOT DETECTED NOT DETECTED Final   Vancomycin resistance NOT  DETECTED NOT DETECTED Final    Comment: Performed at [facility], [place]. [address], [place], [place] [address]  Culture, blood (Routine X 2) w Reflex to ID Panel     Status: Abnormal   Collection Time: 09/09/22  9:34 AM   Specimen: BLOOD RIGHT ARM  Result Value Ref Range Status   Specimen Description BLOOD RIGHT ARM  Final   Special Requests   Final    BOTTLES DRAWN AEROBIC AND ANAEROBIC Blood Culture adequate volume   Culture  Setup Time   Final    GRAM POSITIVE COCCI IN BOTH AEROBIC AND ANAEROBIC BOTTLES CRITICAL VALUE NOTED.  VALUE IS CONSISTENT WITH PREVIOUSLY REPORTED AND CALLED VALUE.    Culture (A)  Final    ENTEROCOCCUS FAECALIS SUSCEPTIBILITIES PERFORMED ON PREVIOUS CULTURE WITHIN THE LAST 5 DAYS. Performed at [facility], [place] [address] [place], [place] [address]    Report Status 09/11/2022 FINAL  Final  Culture, blood (Routine X 2) w Reflex to ID Panel     Status: None (Preliminary result)   Collection Time: 09/10/22  8:15 AM   Specimen: BLOOD  Result Value Ref Range Status   Specimen Description BLOOD RIGHT ANTECUBITAL  Final   Special Requests   Final    BOTTLES DRAWN AEROBIC AND ANAEROBIC Blood Culture adequate volume   Culture   Final    NO GROWTH < 24 HOURS Performed at [facility], [place] [address], [place], [place] [address]    Report Status PENDING  Incomplete  Culture, blood (Routine X 2) w Reflex to ID Panel     Status: None (Preliminary result)   Collection Time: 09/10/22  8:19 AM   Specimen: BLOOD  Result Value Ref Range Status   Specimen Description BLOOD RIGHT ANTECUBITAL  Final   Special Requests   Final    BOTTLES DRAWN AEROBIC AND ANAEROBIC Blood Culture adequate volume   Culture   Final    NO GROWTH < 24 HOURS Performed at [facility], [place] [address], [place], [place] [address]    Report Status PENDING  Incomplete         Radiology Studies: IR REMOVAL TUN ACCESS W/ PORT W/O FL MOD SED  Result Date:  09/10/2022 INDICATION: 77 year old with Enterococcus bacteremia. Request for Port-A-Cath removal. EXAM: REMOVAL RIGHT IJ VEIN PORT-A-CATH MEDICATIONS: Versed 1 mg ANESTHESIA/SEDATION: Patient was monitored by a radiology nurse throughout the procedure. FLUOROSCOPY: None COMPLICATIONS: None immediate. PROCEDURE: Informed written consent was obtained from the patient after a thorough discussion of the procedural risks, benefits and alternatives. All questions were addressed. Maximal Sterile Barrier Technique was utilized including caps, mask, sterile gowns, sterile gloves, sterile drape, hand hygiene and skin antiseptic. A timeout was performed prior to the initiation of the procedure. The right chest was prepped and draped in a sterile fashion. Lidocaine was utilized for local anesthesia. An incision was made over the previously healed surgical incision. Utilizing blunt dissection, the port catheter and reservoir were removed from the underlying subcutaneous tissue in their entirety. The pocket was irrigated with a copious amount of sterile normal saline. The subcutaneous tissue was closed with 3-0 Vicryl interrupted subcutaneous stitches. A 4-0 Vicryl running subcuticular stitch was utilized to approximate the skin. Dermabond was applied. FINDINGS: Port was completely removed. Port pocket appeared healthy without fluid or drainage. IMPRESSION: Successful right IJ vein Port-A-Cath explant. Electronically Signed   By: Markus Daft M.D.   On: 09/10/2022 21:19        Scheduled Meds:  sodium chloride   Intravenous Once   aspirin  81 mg Oral Daily   Chlorhexidine Gluconate Cloth  6 each Topical Daily   insulin aspart  0-5 Units Subcutaneous QHS   insulin aspart  0-9 Units Subcutaneous TID WC   melatonin  3 mg Oral QHS   metoprolol tartrate  12.5 mg Oral BID   potassium chloride  40 mEq Oral Q4H   rosuvastatin  40 mg Oral Daily   senna-docusate  1 tablet Oral  Daily   sodium chloride flush  3 mL Intravenous  Q12H   tamsulosin  0.4 mg Oral BID   Thrombi-Pad  1 each Topical Once   ticagrelor  90 mg Oral BID   Continuous Infusions:  sodium chloride 10 mL/hr at 09/11/22 0824   sodium chloride     ampicillin (OMNIPEN) IV 300 mL/hr at 09/11/22 1200     LOS: 4 days    Time spent: 55 minutes.    Dana Allan, MD  [facility] Pager #: [phone] 7PM-7AM contact night coverage as above

## 2022-09-12 DIAGNOSIS — R7881 Bacteremia: Secondary | ICD-10-CM | POA: Diagnosis not present

## 2022-09-12 DIAGNOSIS — B952 Enterococcus as the cause of diseases classified elsewhere: Secondary | ICD-10-CM | POA: Diagnosis not present

## 2022-09-12 LAB — CBC WITH DIFFERENTIAL/PLATELET
Abs Immature Granulocytes: 0.1 10*3/uL — ABNORMAL HIGH (ref 0.00–0.07)
Band Neutrophils: 12 %
Basophils Absolute: 0.1 10*3/uL (ref 0.0–0.1)
Basophils Relative: 3 %
Eosinophils Absolute: 0 10*3/uL (ref 0.0–0.5)
Eosinophils Relative: 0 %
HCT: 22 % — ABNORMAL LOW (ref 39.0–52.0)
Hemoglobin: 8 g/dL — ABNORMAL LOW (ref 13.0–17.0)
Lymphocytes Relative: 24 %
Lymphs Abs: 0.8 10*3/uL (ref 0.7–4.0)
MCH: 34.6 pg — ABNORMAL HIGH (ref 26.0–34.0)
MCHC: 36.4 g/dL — ABNORMAL HIGH (ref 30.0–36.0)
MCV: 95.2 fL (ref 80.0–100.0)
Metamyelocytes Relative: 2 %
Monocytes Absolute: 0.1 10*3/uL (ref 0.1–1.0)
Monocytes Relative: 4 %
Neutro Abs: 2.1 10*3/uL (ref 1.7–7.7)
Neutrophils Relative %: 55 %
Platelets: 78 10*3/uL — ABNORMAL LOW (ref 150–400)
RBC: 2.31 MIL/uL — ABNORMAL LOW (ref 4.22–5.81)
RDW: 15.6 % — ABNORMAL HIGH (ref 11.5–15.5)
WBC: 3.2 10*3/uL — ABNORMAL LOW (ref 4.0–10.5)
nRBC: 0 % (ref 0.0–0.2)

## 2022-09-12 LAB — RENAL FUNCTION PANEL
Albumin: 2.2 g/dL — ABNORMAL LOW (ref 3.5–5.0)
Anion gap: 9 (ref 5–15)
BUN: 16 mg/dL (ref 8–23)
CO2: 19 mmol/L — ABNORMAL LOW (ref 22–32)
Calcium: 7.9 mg/dL — ABNORMAL LOW (ref 8.9–10.3)
Chloride: 103 mmol/L (ref 98–111)
Creatinine, Ser: 0.83 mg/dL (ref 0.61–1.24)
GFR, Estimated: >60 mL/min (ref >60)
Glucose, Bld: 109 mg/dL — ABNORMAL HIGH (ref 70–99)
Phosphorus: 2.8 mg/dL (ref 2.5–4.6)
Potassium: 4.1 mmol/L (ref 3.5–5.1)
Sodium: 131 mmol/L — ABNORMAL LOW (ref 135–145)

## 2022-09-12 LAB — BASIC METABOLIC PANEL
Anion gap: 10 (ref 5–15)
BUN: 17 mg/dL (ref 8–23)
CO2: 19 mmol/L — ABNORMAL LOW (ref 22–32)
Calcium: 7.9 mg/dL — ABNORMAL LOW (ref 8.9–10.3)
Chloride: 102 mmol/L (ref 98–111)
Creatinine, Ser: 0.86 mg/dL (ref 0.61–1.24)
GFR, Estimated: >60 mL/min (ref >60)
Glucose, Bld: 104 mg/dL — ABNORMAL HIGH (ref 70–99)
Potassium: 4.2 mmol/L (ref 3.5–5.1)
Sodium: 131 mmol/L — ABNORMAL LOW (ref 135–145)

## 2022-09-12 LAB — GLUCOSE, CAPILLARY
Glucose-Capillary: 102 mg/dL — ABNORMAL HIGH (ref 70–99)
Glucose-Capillary: 140 mg/dL — ABNORMAL HIGH (ref 70–99)
Glucose-Capillary: 103 mg/dL — ABNORMAL HIGH (ref 70–99)

## 2022-09-12 LAB — TYPE AND SCREEN
ABO/RH(D): O POS
Antibody Screen: NEGATIVE
Unit division: 0

## 2022-09-12 LAB — BPAM RBC
Blood Product Expiration Date: 202404092359
ISSUE DATE / TIME: 202403151321
Unit Type and Rh: 5100

## 2022-09-12 NOTE — Progress Notes (Signed)
KERMIT RACHAL   DOB:08-12-1945   [id number]    ASSESSMENT & PLAN:  Metastatic neuroendocrine carcinoma Advanced prostate cancer The patient just completed chemotherapy a week ago He is scheduled for CT imaging next week That probably needs to be rescheduled Continue supportive care for now I will notify Dr. Julien Nordmann of his admission to follow next week   Acquired pancytopenia Due to recent chemotherapy Agree with blood transfusion to keep hemoglobin greater than 8 Continue antiplatelet agents; no need for platelet transfusion unless less than 20,000 or signs of bleeding I have prescribed G-CSF support; will give him G-CSF if ANC is less than 1.5   Bacteremia Port has been removed He is on IV antibiotics as directed by infectious disease team   Coronary artery disease status post stent placement Continue medical management We will monitor his platelet count carefully   Goals of care discussion He has no questions for me today   Discharge planning He would likely be here over the weekend    All questions were answered. The patient knows to call the clinic with any problems, questions or concerns.   The total time spent in the appointment was 25 minutes encounter with patients including review of chart and various tests results, discussions about plan of care and coordination of care plan  Heath Lark, MD 09/12/2022 11:54 AM  Subjective:  He was transferred out of ICU The patient denies any recent signs or symptoms of bleeding such as spontaneous epistaxis, hematuria or hematochezia. He is more calm today, no questions for me from oncology perspective. We discussed test results and plan of care  Objective:  Vitals:   09/12/22 0854 09/12/22 0931  BP: 127/80   Pulse: 68 69  Resp: 17   Temp: 98.2 F (36.8 C)   SpO2: 97%      Intake/Output Summary (Last 24 hours) at 09/12/2022 1154 Last data filed at 09/12/2022 0610 Gross per 24 hour  Intake 815 ml  Output 2815 ml   Net -2000 ml    GENERAL:alert, no distress and comfortable  NEURO: alert & oriented x 3 with fluent speech, no focal motor/sensory deficits   Labs:  Recent Labs    08/22/22 0500 08/23/22 0500 08/27/22 1044 08/31/22 1038 09/07/22 1111 09/07/22 1225 09/11/22 0302 09/11/22 1737 09/12/22 0121  NA 133*   < > 138 137 136   < > 128* 132* 131*  131*  K 3.9   < > 3.9 3.9 4.3   < > 3.4* 3.9 4.2  4.1  CL 104   < > 105 104 104   < > 103 104 102  103  CO2 22   < > 27 28 24    < > 19* 19* 19*  19*  GLUCOSE 112*   < > 115* 143* 160*   < > 101* 114* 104*  109*  BUN 26*   < > 14 16 24*   < > 20 16 17  16   CREATININE 0.97   < > 0.75 0.89 0.88   < > 0.98 0.87 0.86  0.83  CALCIUM 8.2*   < > 8.5* 8.7* 9.1   < > 7.9* 8.0* 7.9*  7.9*  GFRNONAA >60   < > >60 >60 >60   < > >60 >60 >60  >60  PROT 5.8*  --  6.1* 6.6 6.8  --   --   --   --   ALBUMIN 2.8*  --  3.4* 3.5 3.7  --   --   --  2.2*  AST 20  --  28 32 39  --   --   --   --   ALT 26  --  32 34 54*  --   --   --   --   ALKPHOS 101  --  175* 152* 160*  --   --   --   --   BILITOT 0.7  --  0.3 0.3 0.6  --   --   --   --   BILIDIR 0.2  --   --   --   --   --   --   --   --   IBILI 0.5  --   --   --   --   --   --   --   --    < > = values in this interval not displayed.    Studies:  IR REMOVAL TUN ACCESS W/ PORT W/O FL MOD SED  Result Date: 09/10/2022 INDICATION: 77 year old with Enterococcus bacteremia. Request for Port-A-Cath removal. EXAM: REMOVAL RIGHT IJ VEIN PORT-A-CATH MEDICATIONS: Versed 1 mg ANESTHESIA/SEDATION: Patient was monitored by a radiology nurse throughout the procedure. FLUOROSCOPY: None COMPLICATIONS: None immediate. PROCEDURE: Informed written consent was obtained from the patient after a thorough discussion of the procedural risks, benefits and alternatives. All questions were addressed. Maximal Sterile Barrier Technique was utilized including caps, mask, sterile gowns, sterile gloves, sterile drape, hand hygiene and  skin antiseptic. A timeout was performed prior to the initiation of the procedure. The right chest was prepped and draped in a sterile fashion. Lidocaine was utilized for local anesthesia. An incision was made over the previously healed surgical incision. Utilizing blunt dissection, the port catheter and reservoir were removed from the underlying subcutaneous tissue in their entirety. The pocket was irrigated with a copious amount of sterile normal saline. The subcutaneous tissue was closed with 3-0 Vicryl interrupted subcutaneous stitches. A 4-0 Vicryl running subcuticular stitch was utilized to approximate the skin. Dermabond was applied. FINDINGS: Port was completely removed. Port pocket appeared healthy without fluid or drainage. IMPRESSION: Successful right IJ vein Port-A-Cath explant. Electronically Signed   By: Markus Daft M.D.   On: 09/10/2022 21:19   DG Chest Port 1 View  Result Date: 09/09/2022 CLINICAL DATA:  Tachycardia EXAM: PORTABLE CHEST 1 VIEW COMPARISON:  CXR 08/22/22 FINDINGS: No pleural effusion. No pneumothorax. Right chest port with tip near the cavoatrial junction. Compared to prior exam there are new patchy airspace opacities at the bilateral lung bases, which could represent atelectasis or infection. No radiographically apparent displaced rib fractures. Visualized upper abdomen is unremarkable. IMPRESSION: Compared to prior exam there are new patchy airspace opacities at the bilateral lung bases, which could represent atelectasis or infection. Electronically Signed   By: Marin Roberts M.D.   On: 09/09/2022 09:55   ECHOCARDIOGRAM COMPLETE  Result Date: 09/08/2022    ECHOCARDIOGRAM REPORT   Patient Name:   BREON ENZ Date of Exam: 09/08/2022 Medical Rec #:  [id number]       Height:       69.0 in Accession #:    [id number]      Weight:       164.9 lb Date of Birth:  14-Feb-1946       BSA:          1.903 m Patient Age:    77 years        BP:           110/65 mmHg  Patient Gender: M                HR:           67 bpm. Exam Location:  Inpatient Procedure: 2D Echo, 3D Echo, Cardiac Doppler, Color Doppler and Intracardiac            Opacification Agent Indications:    Acute MI  History:        Patient has prior history of Echocardiogram examinations, most                 recent 10/22/2020. Carotid Disease, Signs/Symptoms:Murmur and                 Chest Pain; Risk Factors:Dyslipidemia. Hx of cancer,                 chemotherapy.  Sonographer:    Eartha Inch Referring Phys: [id number] Early Osmond  Sonographer Comments: Image acquisition challenging due to patient body habitus and Image acquisition challenging due to respiratory motion. IMPRESSIONS  1. Left ventricular ejection fraction, by estimation, is 65 to 70%. The left ventricle has normal function. The left ventricle demonstrates regional wall motion abnormalities (see scoring diagram/findings for description). There is moderate concentric left ventricular hypertrophy. Left ventricular diastolic parameters are indeterminate.  2. Right ventricular systolic function is normal. The right ventricular size is moderately enlarged. There is normal pulmonary artery systolic pressure.  3. Right atrial size was mildly dilated.  4. The mitral valve is degenerative. No evidence of mitral valve regurgitation. No evidence of mitral stenosis. The mean mitral valve gradient is 3.0 mmHg with average heart rate of 66 bpm. Severe mitral annular calcification.  5. Mixed valve disease. Central aortic regurgitation, normal LV stroke volume index. The aortic valve is calcified. Aortic valve regurgitation is mild to moderate. Mild aortic valve stenosis. Aortic valve mean gradient measures 14.0 mmHg.  6. The inferior vena cava is normal in size with greater than 50% respiratory variability, suggesting right atrial pressure of 3 mmHg. Comparison(s): Prior images reviewed side by side. Similar aortic findings. LV wall motion comparison is difficult- WMA seen best on  contrast images, prior contrast images not performed. FINDINGS  Left Ventricle: Left ventricular ejection fraction, by estimation, is 65 to 70%. The left ventricle has normal function. The left ventricle demonstrates regional wall motion abnormalities. The left ventricular internal cavity size was normal in size. There is moderate concentric left ventricular hypertrophy. Left ventricular diastolic parameters are indeterminate.  LV Wall Scoring: The mid and distal anterior septum and mid inferoseptal segment are hypokinetic. Right Ventricle: The right ventricular size is moderately enlarged. No increase in right ventricular wall thickness. Right ventricular systolic function is normal. There is normal pulmonary artery systolic pressure. The tricuspid regurgitant velocity is 2.00 m/s, and with an assumed right atrial pressure of 3 mmHg, the estimated right ventricular systolic pressure is [id number] mmHg. Left Atrium: Left atrial size was normal in size. Right Atrium: Right atrial size was mildly dilated. Pericardium: There is no evidence of pericardial effusion. Mitral Valve: The mitral valve is degenerative in appearance. Severe mitral annular calcification. No evidence of mitral valve regurgitation. No evidence of mitral valve stenosis. MV peak gradient, 9.5 mmHg. The mean mitral valve gradient is 3.0 mmHg with average heart rate of 66 bpm. Tricuspid Valve: The tricuspid valve is normal in structure. Tricuspid valve regurgitation is trivial. No evidence of tricuspid stenosis. Aortic Valve: Mixed valve disease. Central aortic regurgitation, normal LV stroke volume index. The  aortic valve is calcified. Aortic valve regurgitation is mild to moderate. Aortic regurgitation PHT measures 651 msec. Mild aortic stenosis is present. Aortic valve mean gradient measures 14.0 mmHg. Aortic valve peak gradient measures 21.9 mmHg. Aortic valve area, by VTI measures 2.18 cm. Pulmonic Valve: The pulmonic valve was not well visualized.  Pulmonic valve regurgitation is not visualized. Aorta: The aortic root and ascending aorta are structurally normal, with no evidence of dilitation. Venous: The inferior vena cava is normal in size with greater than 50% respiratory variability, suggesting right atrial pressure of 3 mmHg. IAS/Shunts: No atrial level shunt detected by color flow Doppler.  LEFT VENTRICLE PLAX 2D LVIDd:         4.10 cm      Diastology LVIDs:         2.70 cm      LV e' medial:    5.11 cm/s LV PW:         1.30 cm      LV E/e' medial:  19.4 LV IVS:        1.15 cm      LV e' lateral:   7.40 cm/s LVOT diam:     2.10 cm      LV E/e' lateral: 13.4 LV SV:         108 LV SV Index:   57 LVOT Area:     3.46 cm  LV Volumes (MOD) LV vol d, MOD A2C: 97.7 ml LV vol d, MOD A4C: 134.0 ml LV vol s, MOD A2C: 26.3 ml LV vol s, MOD A4C: 48.9 ml LV SV MOD A2C:     71.4 ml LV SV MOD A4C:     134.0 ml LV SV MOD BP:      77.7 ml RIGHT VENTRICLE             IVC RV S prime:     13.70 cm/s  IVC diam: 1.80 cm TAPSE (M-mode): 2.0 cm LEFT ATRIUM             Index        RIGHT ATRIUM           Index LA diam:        3.90 cm 2.05 cm/m   RA Area:     21.00 cm LA Vol (A2C):   65.0 ml 34.15 ml/m  RA Volume:   66.00 ml  34.68 ml/m LA Vol (A4C):   53.2 ml 27.95 ml/m LA Biplane Vol: 61.3 ml 32.21 ml/m  AORTIC VALVE AV Area (Vmax):    2.06 cm AV Area (Vmean):   1.85 cm AV Area (VTI):     2.18 cm AV Vmax:           234.00 cm/s AV Vmean:          181.000 cm/s AV VTI:            0.495 m AV Peak Grad:      21.9 mmHg AV Mean Grad:      14.0 mmHg LVOT Vmax:         139.00 cm/s LVOT Vmean:        96.800 cm/s LVOT VTI:          0.312 m LVOT/AV VTI ratio: 0.63 AI PHT:            651 msec  AORTA Ao Root diam: 3.20 cm Ao Asc diam:  3.60 cm MITRAL VALVE  TRICUSPID VALVE MV Area (PHT): 2.76 cm     TR Peak grad:   16.0 mmHg MV Area VTI:   2.22 cm     TR Vmax:        200.00 cm/s MV Peak grad:  9.5 mmHg MV Mean grad:  3.0 mmHg     SHUNTS MV Vmax:       1.54 m/s      Systemic VTI:  0.31 m MV Vmean:      88.1 cm/s    Systemic Diam: 2.10 cm MV Decel Time: 275 msec MV E velocity: 99.00 cm/s MV A velocity: 127.00 cm/s MV E/A ratio:  0.78 Rudean Haskell MD Electronically signed by Rudean Haskell MD Signature Date/Time: 09/08/2022/1:09:21 PM    Final    CARDIAC CATHETERIZATION  Result Date: 09/07/2022   Ost RCA lesion is 80% stenosed.   Prox RCA lesion is 70% stenosed.   Mid LM to Dist LM lesion is 50% stenosed.   Dist LM to Ost LAD lesion is 50% stenosed.   Ost Cx lesion is 40% stenosed.   Prox Cx lesion is 60% stenosed.   Mid LAD lesion is 60% stenosed.   A stent was successfully placed.   Post intervention, there is a 0% residual stenosis.   Post intervention, there is a 0% residual stenosis. 1.  Thrombotic occlusion of right coronary artery treated with 4 overlapping drug-eluting stents. 2.  Distal left main, ostial LAD, and ostial left circumflex disease. 3.  LVEDP of 11 mmHg. Recommendation: The results were discussed with Dr. Ellyn Hack.  The patient will be admitted to the hospital, medical therapy will be pursued as well as an echocardiogram.  Will follow-up as an outpatient regarding treatment for residual disease.   DG Chest 2 View  Result Date: 08/22/2022 CLINICAL DATA:  Questionable sepsis.  Multiple pulmonary metastases. EXAM: CHEST - 2 VIEW COMPARISON:  CT abdomen and pelvis no contrast 08/18/2022, CT chest with contrast 07/15/2022 FINDINGS: Heart size and vasculature are normal apart from calcification in the transverse aorta. There is a right chest port newly noted with IJ approach catheter terminating in the distal SVC. There are numerous nodular pulmonary metastases predominating in the lower zones. Largest on the left is in the upper lobe measuring 1.7 cm. Largest on the right is 2 cm in the middle lobe. Again noted is a small left pleural effusion. No focal pneumonia is seen. The right sulci are sharp. There are degenerative changes and mild  kyphodextroscoliosis of the thoracic spine with osteopenia. No thoracic compression fractures. IMPRESSION: 1. Numerous nodular pulmonary metastases. 2. Small left pleural effusion. 3. No evidence of pneumonia or edema. 4. Aortic atherosclerosis. 5. Right chest port newly noted. Electronically Signed   By: Telford Nab M.D.   On: 08/22/2022 00:34   CT ABDOMEN PELVIS WO CONTRAST  Result Date: 08/18/2022 CLINICAL DATA:  Abdominal pain, acute, nonlocalized EXAM: CT ABDOMEN AND PELVIS WITHOUT CONTRAST TECHNIQUE: Multidetector CT imaging of the abdomen and pelvis was performed following the standard protocol without IV contrast. RADIATION DOSE REDUCTION: This exam was performed according to the departmental dose-optimization program which includes automated exposure control, adjustment of the mA and/or kV according to patient size and/or use of iterative reconstruction technique. COMPARISON:  PET-CT 07/15/2022 FINDINGS: Lower chest: Stable small left pleural effusion. Coronary calcifications. Innumerable pulmonary nodules. Largest 2.8 cm in the right middle lobe, previously 2.4 cm. Largest left nodule in the anterior basal segment left lower lobe image 20/3 2.1 cm, previously 1.9.  Hepatobiliary: No focal liver abnormality is seen. No gallstones, gallbladder wall thickening, or biliary dilatation. Pancreas: Diffuse parenchymal atrophy without mass or ductal dilatation. No regional inflammatory change. Spleen: Normal in size without focal abnormality. Adrenals/Urinary Tract: No adrenal mass. Left nephrolithiasis, largest stone 3 mm midpole. There is mild bilateral hydronephrosis right greater than left and ureterectasis down to thick-walled urinary bladder, partially decompressed by Foley catheter. Stomach/Bowel: Stomach is nondistended. Small bowel decompressed. Normal appendix. Colon is partially distended by gas and fecal material, with scattered distal descending and sigmoid diverticula; no adjacent inflammatory  change. Vascular/Lymphatic: Moderate scattered calcified aortoiliac calcified plaque without aneurysm. Retroaortic left renal vein, anatomic variant. No abdominal or pelvic adenopathy. Reproductive: Prostate enlargement with coarse calcifications Other: No ascites.  No free air. Musculoskeletal: Mild lumbar dextroscoliosis apex L3 with multilevel spondylitic change. Sclerotic lesions in bilateral inferior ischiopubic rami, left sacrum. For no fracture or acute process. IMPRESSION: 1. Mild bilateral hydronephrosis and ureterectasis down to thick-walled urinary bladder, partially decompressed by Foley catheter. 2. Right nephrolithiasis. 3. Innumerable pulmonary nodules, some of which have increased in size. 4. Stable sacral and pelvic bone lesions. 5. Stable small left pleural effusion. 6. Descending and sigmoid diverticulosis. 7.  Aortic Atherosclerosis (ICD10-I70.0). Electronically Signed   By: Lucrezia Europe M.D.   On: 08/18/2022 17:52

## 2022-09-12 NOTE — Progress Notes (Signed)
PROGRESS NOTE    Larry Kent  [id number] DOB: 1945/11/13 DOA: 09/07/2022 PCP: Ria Bush, MD   Brief Narrative:  Patient is a 77 year old male with metastatic prostate cancer on palliative chemotherapy who initially presented with acute inferior wall STEMI s/p cardiac cath and stent placement. Course was complicated with UTI/sepsis due to Enterococcus faecalis bacteremia.  Patient is currently on IV ampicillin.   Patient has had problems with urinary retention and, currently has Foley catheter in place.  [facility] assumed primary attending from 09/11/2022.  Oncology, urology, IR cardiology, infectious disease are consulted.  PCCM has signed off.  Assessment & Plan:   Inferior STEMI: -S/p LHC with distal occlusion of RCA with 4x DES -TTE 3/12 with EF 65-75%, mild hypokinesis the mid inferiorseptal segment as well as the mid and distal anterior septal segments; suspect anterior portion hypokinesis in setting of nonstented mid LAD stenosis  -Cardiology on board recommend to continue current medical management.  No plans for repeat LHC and stent placement until patient is symptomatic. -Continue aspirin, Brilinta for at least 12 months given status post 4 stents and high risk for occlusion.  Continue metoprolol 12.5 mg twice daily, need conversion to Toprol when ready to discharge. -prior recommendation by Dr. Ellyn Hack for a  stress PET scan in ~1 month to evaluate left coronary system for ischemia based on the left main, LAD and LCx disease.   Enterococcus bacteremia: -Source is suspected urinary with Enterococcus faecalis.  S/p port removal on 3/14 by IR -ID recommended 2 weeks course of ampicillin with a continuous infusion at home -ID do not think that he needs TEE given the abrupt onset of bacteremia  Metastatic neuroendocrine carcinoma Advanced prostate cancer: -Just finished chemotherapy a week ago.  Scheduled for CT imaging next week -Oncology on board.  Appreciate  help  Acquired pancytopenia: Likely due to recent chemo -No need for platelet transfusion until less than 20,000 or signs of bleeding. -G-CSF if ANC is less than 1.5 -Blood transfusion if hemoglobin is less than 8  Urinary retention: -Status post Foley placement -Appreciate urology help  Type 2 diabetes: A1c 6.8%.  Continue sliding scale insulin  DVT prophylaxis: SCD Code Status: Full code Family Communication:  None present at bedside.  Plan of care discussed with patient in length and he verbalized understanding and agreed with it. Disposition Plan: To be determined  Consultants:  Cardiology  IR ID Urology Oncology  Procedures:  Port removal  Antimicrobials:  Ampicillin  Status is: Inpatient    Subjective: Patient seen and examined.  Resting comfortably on the bed.  Denies chest pain, shortness of breath, palpitations, lightheadedness, dizziness.  Tmax 100.2.  No acute events overnight.  Objective: Vitals:   09/12/22 0344 09/12/22 0607 09/12/22 0854 09/12/22 0931  BP: 101/67  127/80   Pulse: 66 70 68 69  Resp: 18 18 17    Temp: 98 F (36.7 C) 97.6 F (36.4 C) 98.2 F (36.8 C)   TempSrc: Oral Oral Oral   SpO2: 98% 97% 97%   Weight:      Height:        Intake/Output Summary (Last 24 hours) at 09/12/2022 1346 Last data filed at 09/12/2022 0610 Gross per 24 hour  Intake 790.1 ml  Output 2815 ml  Net -2024.9 ml   Filed Weights   09/08/22 0605 09/09/22 0600 09/11/22 0600  Weight: 74.8 kg 74.2 kg 76.7 kg    Examination:  General exam: Appears calm and comfortable, on room air, communicating well Respiratory  system: Clear to auscultation. Respiratory effort normal. Cardiovascular system: S1 & S2 heard, RRR. No JVD, murmurs, rubs, gallops or clicks. No pedal edema. Gastrointestinal system: Abdomen is nondistended, soft and nontender. No organomegaly or masses felt. Normal bowel sounds heard. Central nervous system: Alert and oriented. No focal  neurological deficits. Extremities: Symmetric 5 x 5 power. Skin: Multiple bruises noted on the arms Psychiatry: Judgement and insight appear normal. Mood & affect appropriate.    Data Reviewed: I have personally reviewed following labs and imaging studies  CBC: Recent Labs  Lab 09/07/22 1111 09/07/22 1225 09/09/22 0740 09/09/22 0932 09/10/22 0815 09/11/22 0302 09/11/22 1737 09/12/22 0121  WBC 12.9*   < > 4.3 3.8* 3.0* 2.0*  --  3.2*  NEUTROABS 11.0*  --   --  3.2 2.4  --   --  2.1  HGB 10.0*   < > 8.2* 8.3* 8.2* 7.4* 8.9* 8.0*  HCT 27.9*   < > 22.9* 23.4* 23.1* 21.6* 26.3* 22.0*  MCV 98.9   < > 99.1 100.0 98.7 101.9*  --  95.2  PLT 302   < > 183 182 124* 100*  --  78*   < > = values in this interval not displayed.   Basic Metabolic Panel: Recent Labs  Lab 09/09/22 0740 09/10/22 0815 09/11/22 0302 09/11/22 1420 09/11/22 1737 09/12/22 0121  NA 129* 128* 128*  --  132* 131*  131*  K 3.8 3.6 3.4*  --  3.9 4.2  4.1  CL 99 100 103  --  104 102  103  CO2 17* 20* 19*  --  19* 19*  19*  GLUCOSE 113* 120* 101*  --  114* 104*  109*  BUN 23 19 20   --  16 17  16   CREATININE 1.00 1.00 0.98  --  0.87 0.86  0.83  CALCIUM 8.4* 8.2* 7.9*  --  8.0* 7.9*  7.9*  MG  --   --   --  1.7  --   --   PHOS  --   --   --   --   --  2.8   GFR: Estimated Creatinine Clearance: 71.9 mL/min (by C-G formula based on SCr of 0.86 mg/dL). Liver Function Tests: Recent Labs  Lab 09/07/22 1111 09/12/22 0121  AST 39  --   ALT 54*  --   ALKPHOS 160*  --   BILITOT 0.6  --   PROT 6.8  --   ALBUMIN 3.7 2.2*   No results for input(s): "LIPASE", "AMYLASE" in the last 168 hours. No results for input(s): "AMMONIA" in the last 168 hours. Coagulation Profile: Recent Labs  Lab 09/07/22 1323  INR 1.2   Cardiac Enzymes: No results for input(s): "CKTOTAL", "CKMB", "CKMBINDEX", "TROPONINI" in the last 168 hours. BNP (last 3 results) No results for input(s): "PROBNP" in the last 8760  hours. HbA1C: No results for input(s): "HGBA1C" in the last 72 hours. CBG: Recent Labs  Lab 09/11/22 0636 09/11/22 1115 09/11/22 1555 09/11/22 2229 09/12/22 1200  GLUCAP 92 100* 134* 145* 102*   Lipid Profile: No results for input(s): "CHOL", "HDL", "LDLCALC", "TRIG", "CHOLHDL", "LDLDIRECT" in the last 72 hours. Thyroid Function Tests: Recent Labs    09/11/22 1420  TSH 2.144   Anemia Panel: No results for input(s): "VITAMINB12", "FOLATE", "FERRITIN", "TIBC", "IRON", "RETICCTPCT" in the last 72 hours. Sepsis Labs: Recent Labs  Lab 09/07/22 1315 09/07/22 1639 09/09/22 0932  PROCALCITON  --   --  0.10  LATICACIDVEN 1.9  1.6  --     Recent Results (from the past 240 hour(s))  Urine Culture (for pregnant, neutropenic or urologic patients or patients with an indwelling urinary catheter)     Status: Abnormal   Collection Time: 09/09/22  9:10 AM   Specimen: Urine, Catheterized  Result Value Ref Range Status   Specimen Description URINE, CATHETERIZED  Final   Special Requests   Final    Immunocompromised Performed at [facility], [place] [address] [place], [place] [address]    Culture >=100,000 COLONIES/mL ENTEROCOCCUS FAECALIS (A)  Final   Report Status 09/11/2022 FINAL  Final   Organism ID, Bacteria ENTEROCOCCUS FAECALIS (A)  Final      Susceptibility   Enterococcus faecalis - MIC*    AMPICILLIN <=2 SENSITIVE Sensitive     NITROFURANTOIN <=16 SENSITIVE Sensitive     VANCOMYCIN 1 SENSITIVE Sensitive     LINEZOLID 2 SENSITIVE Sensitive     * >=100,000 COLONIES/mL ENTEROCOCCUS FAECALIS  Culture, blood (Routine X 2) w Reflex to ID Panel     Status: Abnormal   Collection Time: 09/09/22  9:32 AM   Specimen: BLOOD RIGHT ARM  Result Value Ref Range Status   Specimen Description BLOOD RIGHT ARM  Final   Special Requests   Final    BOTTLES DRAWN AEROBIC AND ANAEROBIC Blood Culture adequate volume   Culture  Setup Time   Final    GRAM POSITIVE COCCI IN CHAINS IN BOTH  AEROBIC AND ANAEROBIC BOTTLES Organism ID to follow CRITICAL RESULT CALLED TO, READ BACK BY AND VERIFIED WITH: T RUDISILL,PHARMD@[email or web address]  09/09/22 [place] Performed at [facility], [place] [address] [place],  [address]    Culture ENTEROCOCCUS FAECALIS (A)  Final   Report Status 09/11/2022 FINAL  Final   Organism ID, Bacteria ENTEROCOCCUS FAECALIS  Final      Susceptibility   Enterococcus faecalis - MIC*    AMPICILLIN <=2 SENSITIVE Sensitive     VANCOMYCIN 1 SENSITIVE Sensitive     GENTAMICIN SYNERGY SENSITIVE Sensitive     LINEZOLID 2 SENSITIVE Sensitive     * ENTEROCOCCUS FAECALIS  Blood Culture ID Panel (Reflexed)     Status: Abnormal   Collection Time: 09/09/22  9:32 AM  Result Value Ref Range Status   Enterococcus faecalis DETECTED (A) NOT DETECTED Final    Comment: CRITICAL RESULT CALLED TO, READ BACK BY AND VERIFIED WITH: T RUDISILL,PHARMD@[email or web address]  09/09/22 [place]    Enterococcus Faecium NOT DETECTED NOT DETECTED Final   Listeria monocytogenes NOT DETECTED NOT DETECTED Final   Staphylococcus species NOT DETECTED NOT DETECTED Final   Staphylococcus aureus (BCID) NOT DETECTED NOT DETECTED Final   Staphylococcus epidermidis NOT DETECTED NOT DETECTED Final   Staphylococcus lugdunensis NOT DETECTED NOT DETECTED Final   Streptococcus species NOT DETECTED NOT DETECTED Final   Streptococcus agalactiae NOT DETECTED NOT DETECTED Final   Streptococcus pneumoniae NOT DETECTED NOT DETECTED Final   Streptococcus pyogenes NOT DETECTED NOT DETECTED Final   A.calcoaceticus-baumannii NOT DETECTED NOT DETECTED Final   Bacteroides fragilis NOT DETECTED NOT DETECTED Final   Enterobacterales NOT DETECTED NOT DETECTED Final   Enterobacter cloacae complex NOT DETECTED NOT DETECTED Final   Escherichia coli NOT DETECTED NOT DETECTED Final   Klebsiella aerogenes NOT DETECTED NOT DETECTED Final   Klebsiella oxytoca NOT DETECTED NOT DETECTED Final   Klebsiella pneumoniae NOT DETECTED NOT DETECTED Final    Proteus species NOT DETECTED NOT DETECTED Final   Salmonella species NOT DETECTED NOT DETECTED Final  Serratia marcescens NOT DETECTED NOT DETECTED Final   Haemophilus influenzae NOT DETECTED NOT DETECTED Final   Neisseria meningitidis NOT DETECTED NOT DETECTED Final   Pseudomonas aeruginosa NOT DETECTED NOT DETECTED Final   Stenotrophomonas maltophilia NOT DETECTED NOT DETECTED Final   Candida albicans NOT DETECTED NOT DETECTED Final   Candida auris NOT DETECTED NOT DETECTED Final   Candida glabrata NOT DETECTED NOT DETECTED Final   Candida krusei NOT DETECTED NOT DETECTED Final   Candida parapsilosis NOT DETECTED NOT DETECTED Final   Candida tropicalis NOT DETECTED NOT DETECTED Final   Cryptococcus neoformans/gattii NOT DETECTED NOT DETECTED Final   Vancomycin resistance NOT DETECTED NOT DETECTED Final    Comment: Performed at [facility], [place] [address], [place], [place] [address]  Culture, blood (Routine X 2) w Reflex to ID Panel     Status: Abnormal   Collection Time: 09/09/22  9:34 AM   Specimen: BLOOD RIGHT ARM  Result Value Ref Range Status   Specimen Description BLOOD RIGHT ARM  Final   Special Requests   Final    BOTTLES DRAWN AEROBIC AND ANAEROBIC Blood Culture adequate volume   Culture  Setup Time   Final    GRAM POSITIVE COCCI IN BOTH AEROBIC AND ANAEROBIC BOTTLES CRITICAL VALUE NOTED.  VALUE IS CONSISTENT WITH PREVIOUSLY REPORTED AND CALLED VALUE.    Culture (A)  Final    ENTEROCOCCUS FAECALIS SUSCEPTIBILITIES PERFORMED ON PREVIOUS CULTURE WITHIN THE LAST 5 DAYS. Performed at [facility], [place] [address], [place], [place] [address]    Report Status 09/11/2022 FINAL  Final  Culture, blood (Routine X 2) w Reflex to ID Panel     Status: None (Preliminary result)   Collection Time: 09/10/22  8:15 AM   Specimen: BLOOD  Result Value Ref Range Status   Specimen Description BLOOD RIGHT ANTECUBITAL  Final   Special Requests   Final    BOTTLES DRAWN AEROBIC  AND ANAEROBIC Blood Culture adequate volume   Culture   Final    NO GROWTH 2 DAYS Performed at [facility], [place] [address] [place], [place] [address]    Report Status PENDING  Incomplete  Culture, blood (Routine X 2) w Reflex to ID Panel     Status: None (Preliminary result)   Collection Time: 09/10/22  8:19 AM   Specimen: BLOOD  Result Value Ref Range Status   Specimen Description BLOOD RIGHT ANTECUBITAL  Final   Special Requests   Final    BOTTLES DRAWN AEROBIC AND ANAEROBIC Blood Culture adequate volume   Culture   Final    NO GROWTH 2 DAYS Performed at [facility], [place] [address] [place], [place] [address]    Report Status PENDING  Incomplete  Culture, blood (Routine X 2) w Reflex to ID Panel     Status: None (Preliminary result)   Collection Time: 09/11/22 11:18 AM   Specimen: BLOOD  Result Value Ref Range Status   Specimen Description BLOOD BLOOD RIGHT HAND  Final   Special Requests   Final    BOTTLES DRAWN AEROBIC AND ANAEROBIC Blood Culture adequate volume   Culture   Final    NO GROWTH < 24 HOURS Performed at [facility], [place] [address], [place], [place] [address]    Report Status PENDING  Incomplete  Culture, blood (Routine X 2) w Reflex to ID Panel     Status: None (Preliminary result)   Collection Time: 09/11/22 11:18 AM  Specimen: BLOOD  Result Value Ref Range Status   Specimen Description BLOOD LEFT ANTECUBITAL  Final   Special Requests   Final    BOTTLES DRAWN AEROBIC AND ANAEROBIC Blood Culture adequate volume   Culture   Final    NO GROWTH < 24 HOURS Performed at [facility], [address] [place], [place] [address]    Report Status PENDING  Incomplete      Radiology Studies: IR REMOVAL TUN ACCESS W/ PORT W/O FL MOD SED  Result Date: 09/10/2022 INDICATION: 77 year old with Enterococcus bacteremia. Request for Port-A-Cath removal. EXAM: REMOVAL RIGHT IJ VEIN PORT-A-CATH MEDICATIONS: Versed 1 mg ANESTHESIA/SEDATION:  Patient was monitored by a radiology nurse throughout the procedure. FLUOROSCOPY: None COMPLICATIONS: None immediate. PROCEDURE: Informed written consent was obtained from the patient after a thorough discussion of the procedural risks, benefits and alternatives. All questions were addressed. Maximal Sterile Barrier Technique was utilized including caps, mask, sterile gowns, sterile gloves, sterile drape, hand hygiene and skin antiseptic. A timeout was performed prior to the initiation of the procedure. The right chest was prepped and draped in a sterile fashion. Lidocaine was utilized for local anesthesia. An incision was made over the previously healed surgical incision. Utilizing blunt dissection, the port catheter and reservoir were removed from the underlying subcutaneous tissue in their entirety. The pocket was irrigated with a copious amount of sterile normal saline. The subcutaneous tissue was closed with 3-0 Vicryl interrupted subcutaneous stitches. A 4-0 Vicryl running subcuticular stitch was utilized to approximate the skin. Dermabond was applied. FINDINGS: Port was completely removed. Port pocket appeared healthy without fluid or drainage. IMPRESSION: Successful right IJ vein Port-A-Cath explant. Electronically Signed   By: Markus Daft M.D.   On: 09/10/2022 21:19    Scheduled Meds:  sodium chloride   Intravenous Once   aspirin  81 mg Oral Daily   Chlorhexidine Gluconate Cloth  6 each Topical Daily   insulin aspart  0-5 Units Subcutaneous QHS   insulin aspart  0-9 Units Subcutaneous TID WC   melatonin  3 mg Oral QHS   metoprolol tartrate  12.5 mg Oral BID   rosuvastatin  40 mg Oral Daily   senna-docusate  1 tablet Oral Daily   sodium chloride flush  3 mL Intravenous Q12H   tamsulosin  0.4 mg Oral BID   Thrombi-Pad  1 each Topical Once   ticagrelor  90 mg Oral BID   Continuous Infusions:  sodium chloride 10 mL/hr at 09/11/22 0824   sodium chloride     ampicillin (OMNIPEN) IV 2 g  (09/12/22 0939)     LOS: 5 days   Time spent: 35 minutes   Sayge Salvato Loann Quill, MD [facility]  If 7PM-7AM, please contact night-coverage [email or web address] 09/12/2022, 1:46 PM

## 2022-09-12 NOTE — Progress Notes (Signed)
CARDIOLOGY ROUNDING PROGRESS NOTE  Patient Name: Larry Kent Date of Encounter: 09/12/2022  [facility] Cardiologist: Kathlyn Sacramento, MD   Patient Profile     77 y.o. male with a PHX of metastatic prostate cancer, lung nodules concerning for neuroendocrine carcinoma on biopsy undergoing palliative chemo/immunotherapy, carotid disease s/p carotid endarterectomy in 2021 for severe, asymptomatic stenosis, HLD presenting from oncologist office to [facility] ED for chest pain evaluation, found to have an inferior MI  on EKG, subsequently transferred to [facility] s/p cardiac catheterization with stent placement in the RCA, with febrile illness of post PCI day 2, now with enterococcus faecalis bacteremia; Also noted to have dropping WBC & Hgb & Na+.   Subjective   Feels well today, no issues  ROS:  All other ROS reviewed and negative. Pertinent positives noted in the HPI.     Inpatient Medications  Scheduled Meds:  sodium chloride   Intravenous Once   aspirin  81 mg Oral Daily   Chlorhexidine Gluconate Cloth  6 each Topical Daily   insulin aspart  0-5 Units Subcutaneous QHS   insulin aspart  0-9 Units Subcutaneous TID WC   melatonin  3 mg Oral QHS   metoprolol tartrate  12.5 mg Oral BID   rosuvastatin  40 mg Oral Daily   senna-docusate  1 tablet Oral Daily   sodium chloride flush  3 mL Intravenous Q12H   tamsulosin  0.4 mg Oral BID   Thrombi-Pad  1 each Topical Once   ticagrelor  90 mg Oral BID   Continuous Infusions:  sodium chloride 10 mL/hr at 09/11/22 0824   sodium chloride     ampicillin (OMNIPEN) IV 2 g (09/12/22 0939)   PRN Meds: sodium chloride, acetaminophen, fentaNYL (SUBLIMAZE) injection, ondansetron (ZOFRAN) IV, mouth rinse, sodium chloride flush   Vital Signs    Vitals:   09/12/22 0344 09/12/22 0607 09/12/22 0854 09/12/22 0931  BP: 101/67  127/80   Pulse: 66 70 68 69  Resp: 18 18 17    Temp: 98 F (36.7 C) 97.6 F (36.4 C) 98.2 F (36.8 C)   TempSrc: Oral Oral  Oral   SpO2: 98% 97% 97%   Weight:      Height:        Intake/Output Summary (Last 24 hours) at 09/12/2022 1315 Last data filed at 09/12/2022 0610 Gross per 24 hour  Intake 790.1 ml  Output 2815 ml  Net -2024.9 ml      09/11/2022    6:00 AM 09/09/2022    6:00 AM 09/08/2022    6:05 AM  Last 3 Weights  Weight (lbs) 169 lb 1.5 oz 163 lb 9.3 oz 164 lb 14.5 oz  Weight (kg) 76.7 kg 74.2 kg 74.8 kg     EKG NSR which I personally reviewed.   Physical Exam  Physical Exam Gen: well appearing Neuro: alert and oriented CV: r,r,r , SEM, RUSB, No sig JVD Vasc: 2+ radial pulses Pulm: CLAB Abd: non distended Ext: No LE edema Skin: warm and well perfused Psych: normal mood  Labs    High Sensitivity Troponin:   Recent Labs  Lab 09/07/22 1225 09/07/22 1639  TROPONINIHS 3,532* 7,318*     Chemistry Recent Labs  Lab 09/07/22 1111 09/07/22 1225 09/11/22 0302 09/11/22 1420 09/11/22 1737 09/12/22 0121  NA 136   < > 128*  --  132* 131*  131*  K 4.3   < > 3.4*  --  3.9 4.2  4.1  CL 104   < >  103  --  104 102  103  CO2 24   < > 19*  --  19* 19*  19*  GLUCOSE 160*   < > 101*  --  114* 104*  109*  BUN 24*   < > 20  --  16 17  16   CREATININE 0.88   < > 0.98  --  0.87 0.86  0.83  CALCIUM 9.1   < > 7.9*  --  8.0* 7.9*  7.9*  MG  --   --   --  1.7  --   --   PROT 6.8  --   --   --   --   --   ALBUMIN 3.7  --   --   --   --  2.2*  AST 39  --   --   --   --   --   ALT 54*  --   --   --   --   --   ALKPHOS 160*  --   --   --   --   --   BILITOT 0.6  --   --   --   --   --   GFRNONAA >60   < > >60  --  >60 >60  >60  ANIONGAP 8   < > 6  --  9 10  9    < > = values in this interval not displayed.    Lipids  Recent Labs  Lab 09/07/22 1323  CHOL 137  TRIG 55  HDL 62  LDLCALC 64  CHOLHDL 2.2    Hematology Recent Labs  Lab 09/10/22 0815 09/11/22 0302 09/11/22 1737 09/12/22 0121  WBC 3.0* 2.0*  --  3.2*  RBC 2.34* 2.12*  --  2.31*  HGB 8.2* 7.4* 8.9* 8.0*  HCT  23.1* 21.6* 26.3* 22.0*  MCV 98.7 101.9*  --  95.2  MCH 35.0* 34.9*  --  34.6*  MCHC 35.5 34.3  --  36.4*  RDW 13.6 14.0  --  15.6*  PLT 124* 100*  --  78*     Thyroid  Recent Labs  Lab 09/11/22 1420  TSH 2.144    BNPNo results for input(s): "BNP", "PROBNP" in the last 168 hours.  DDimer No results for input(s): "DDIMER" in the last 168 hours.   Microbiology: Culture: gram positive cocci BCID: Enterococcus faecalils - awaiting sensitivities    Radiology    DG Chest Port 1 View  Result Date: 09/09/2022 CLINICAL DATA:  Tachycardia EXAM: PORTABLE CHEST 1 VIEW COMPARISON:  CXR 08/22/22 FINDINGS: No pleural effusion. No pneumothorax. Right chest port with tip near the cavoatrial junction. Compared to prior exam there are new patchy airspace opacities at the bilateral lung bases, which could represent atelectasis or infection. No radiographically apparent displaced rib fractures. Visualized upper abdomen is unremarkable. IMPRESSION: Compared to prior exam there are new patchy airspace opacities at the bilateral lung bases, which could represent atelectasis or infection. Electronically Signed   By: Marin Roberts M.D.   On: 09/09/2022 09:55   Cardiac Studies - Summarized   09/08/2022 ECHOCARDIOGRAM  IMPRESSIONS   1. Left ventricular ejection fraction, by estimation, is 65 to 70%. The left ventricle has normal function. The left ventricle demonstrates regional wall motion abnormalities:The mid and distal anterior septum and mid inferoseptal segment are  hypokinetic. There is moderate concentric left ventricular hypertrophy. Left ventricular diastolic parameters are indeterminate.   2. Right ventricular systolic function is normal. The right  ventricular size is moderately enlarged. There is normal pulmonary artery systolic pressure.   3. Right atrial size was mildly dilated.   4. The mitral valve is degenerative. No evidence of mitral valve regurgitation. No evidence of mitral stenosis.  The mean mitral valve gradient is 3.0 mmHg with average heart rate of 66 bpm. Severe mitral annular calcification.   5. Mixed valve disease. Central aortic regurgitation, normal LV stroke volume index. The aortic valve is calcified. Aortic valve regurgitation is mild to moderate. Mild aortic valve stenosis. Aortic valve mean gradient measures 14.0 mmHg.   6. The inferior vena cava is normal in size with greater than 50% respiratory variability, suggesting right atrial pressure of 3 mmHg.   Cardiac cath-PCI RCA 09/07/2022: Diagnostic Dominance: Right     Intervention          09/23/2021 VAS US carotid duplex Summary:  Right Carotid: There is no evidence of stenosis in the right ICA.  Left Carotid: Velocities in the left ICA are consistent with a 1-39% stenosis.  Vertebrals: Right vertebral artery demonstrates antegrade flow. Left  vertebral artery demonstrates high resistant flow.  Subclavians: Normal flow hemodynamics were seen in bilateral subclavian arteries.   10/22/2020 ECHO complete LVEF 55-60%. Left ventricle with normal function. Moderate left ventricular hypertrophy. Grade II diastolic dysfunction. Elevated L atrial pressure R ventricle with normal function with mild enlargement.  Mitral valve abnormal with no evidence of MV regurgitation or evidence of mitral stenosis Mild calcification of the aortic valve. Mild thickening. Trivial AV regurgitation  Assessment & Plan   Enterococcus faecalis bacteremia E faecalis UTI Leukopenia - continue w/u - no large vegetations on his echo - can consider TEE if no source identified  Concern of bacterial infection prior to hospitalization; low grade fever on admission. Indwelling foley catheter prior to admission (new foley placed by Urology on 3/11) - S/p chemotherapy port removal 3/14 - On ampicillin Ampicillin 3/14 -   Inferior STEMI ST elevation leads II, III, and aVF S/p LHC with distal occlusion of RCA with 4x DES Distal LM,  ostial left circumflex, distal and ostial LAD stenoses not stented -TTE 3/12 with EF 65-75%, mild hypokinesis the mid inferiorseptal segment as well as the  mid and distal anterior septal segments; suspect anterior portion hypokinesis in setting of nonstented mid LAD stenosis - Continue medical management; no plans for repeat LHC and stent placement unless patient is symptomatic - Continue ASA and Brillinta therapy for at least 12 months given s/p 4 stents and high risk for occlusion - Continue on Metoprolol tartrate 12.5 mg BID; will need conversion to Toprol when ready to discharge - prior recommendation by Dr. Ellyn Hack for a  stress PET scan in ~1 month to evaluate left coronary system for ischemia based on the left main, LAD and LCx disease.   -And monitor for anginal symptoms to evaluate need for additional revascularization ==> anticipate this being pushed back to allow for full treatment of bacteremia. - Monitor for Brillinta side effects including dyspnea and bradycardia - continue metop tartrate 12.5 mg BID --> no changes today 3/16  Prediabetes A1c 6.4 -SSI  HLD Now on Crestor 40 mg daily   Time Spent Directly with Patient:  I have spent a total of 35 minutes with the patient reviewing hospital notes, telemetry, EKGs, labs and examining the patient as well as establishing an assessment and plan that was discussed personally with the patient.  > 50% of time was spent in direct patient care.  For questions or updates, please contact [place] Please consult [email or web address] for contact info under

## 2022-09-12 NOTE — Progress Notes (Signed)
Pt doing well, reports walking in room with assist. Pt was given education materials.

## 2022-09-13 LAB — BASIC METABOLIC PANEL
Anion gap: 6 (ref 5–15)
BUN: 16 mg/dL (ref 8–23)
CO2: 21 mmol/L — ABNORMAL LOW (ref 22–32)
Calcium: 8.3 mg/dL — ABNORMAL LOW (ref 8.9–10.3)
Chloride: 106 mmol/L (ref 98–111)
Creatinine, Ser: 0.98 mg/dL (ref 0.61–1.24)
GFR, Estimated: >60 mL/min (ref >60)
Glucose, Bld: 132 mg/dL — ABNORMAL HIGH (ref 70–99)
Potassium: 3.9 mmol/L (ref 3.5–5.1)
Sodium: 133 mmol/L — ABNORMAL LOW (ref 135–145)

## 2022-09-13 LAB — CBC
HCT: 24.8 % — ABNORMAL LOW (ref 39.0–52.0)
Hemoglobin: 8.3 g/dL — ABNORMAL LOW (ref 13.0–17.0)
MCH: 32.8 pg (ref 26.0–34.0)
MCHC: 33.5 g/dL (ref 30.0–36.0)
MCV: 98 fL (ref 80.0–100.0)
Platelets: 95 10*3/uL — ABNORMAL LOW (ref 150–400)
RBC: 2.53 MIL/uL — ABNORMAL LOW (ref 4.22–5.81)
RDW: 15.6 % — ABNORMAL HIGH (ref 11.5–15.5)
WBC: 4.9 10*3/uL (ref 4.0–10.5)
nRBC: 0 % (ref 0.0–0.2)

## 2022-09-13 LAB — GLUCOSE, CAPILLARY
Glucose-Capillary: 101 mg/dL — ABNORMAL HIGH (ref 70–99)
Glucose-Capillary: 91 mg/dL (ref 70–99)
Glucose-Capillary: 134 mg/dL — ABNORMAL HIGH (ref 70–99)
Glucose-Capillary: 137 mg/dL — ABNORMAL HIGH (ref 70–99)

## 2022-09-13 NOTE — Plan of Care (Signed)
Enterococcus faecalis suspected as a urinary source. No changes in cardiac medications. We can follow peripherally

## 2022-09-13 NOTE — Progress Notes (Signed)
PROGRESS NOTE    Larry Kent  [id number] DOB: [birth year] DOA: 09/07/2022 PCP: Ria Bush, MD   Brief Narrative:  Patient is a 77 year old male with metastatic prostate cancer on palliative chemotherapy who initially presented with acute inferior wall STEMI s/p cardiac cath and stent placement. Course was complicated with UTI/sepsis due to Enterococcus faecalis bacteremia.  Patient is currently on IV ampicillin.   Patient has had problems with urinary retention and, currently has Foley catheter in place.  [facility] assumed primary attending from 09/11/2022.  Oncology, urology, IR cardiology, infectious disease are consulted.  PCCM has signed off.  Assessment & Plan:   Inferior STEMI: -S/p LHC with distal occlusion of RCA with 4x DES -TTE 3/12 with EF 65-75%, mild hypokinesis the mid inferiorseptal segment as well as the mid and distal anterior septal segments; suspect anterior portion hypokinesis in setting of nonstented mid LAD stenosis  -Cardiology on board recommend to continue current medical management.  No plans for repeat LHC and stent placement until patient is symptomatic. -Continue aspirin, Brilinta for at least 12 months given status post 4 stents and high risk for occlusion.  - Continue metoprolol 12.5 mg twice daily, need conversion to Toprol when ready to discharge. -prior recommendation by Dr. Ellyn Hack for a  stress PET scan in ~1 month to evaluate left coronary system for ischemia based on the left main, LAD and LCx disease.   Enterococcus bacteremia: -Source is suspected urinary with Enterococcus faecalis.   -S/p port removal on 3/14 by IR -ID recommended 2 weeks course of ampicillin with a continuous infusion at home -ID do not think that he needs TEE given the abrupt onset of bacteremia  Metastatic neuroendocrine carcinoma Advanced prostate cancer: -Just finished chemotherapy a week ago.  Scheduled for CT imaging next week -Oncology on board.  Appreciate  help  Acquired pancytopenia: Likely due to recent chemo -No need for platelet transfusion until less than 20,000 or signs of bleeding. -G-CSF if ANC is less than 1.5 -Blood transfusion if hemoglobin is less than 8 -Overall numbers are improving  Urinary retention: -Status post Foley placement.  Continue Flomax -Appreciate urology help  Type 2 diabetes: A1c 6.8%.  Continue sliding scale insulin  Hyperlipidemia: Continue statin  DVT prophylaxis: SCD Code Status: Full code Family Communication:  None present at bedside.  Plan of care discussed with patient in length and he verbalized understanding and agreed with it. Disposition Plan: To be determined  Consultants:  Cardiology  IR ID Urology Oncology  Procedures:  Port removal  Antimicrobials:  Ampicillin  Status is: Inpatient    Subjective: Patient seen and examined.  Resting comfortably on the bed.  He denies any complaints today.  Remained afebrile.  No acute events overnight  Objective: Vitals:   09/12/22 1944 09/13/22 0414 09/13/22 0841 09/13/22 0850  BP: 112/70 106/62 112/72   Pulse: 85 62 62 66  Resp:  18    Temp: 98.4 F (36.9 C) 97.9 F (36.6 C) 98.1 F (36.7 C)   TempSrc: Oral Oral Oral   SpO2: 99% 100% 100%   Weight:      Height:        Intake/Output Summary (Last 24 hours) at 09/13/2022 0923 Last data filed at 09/13/2022 0412 Gross per 24 hour  Intake --  Output 2425 ml  Net -2425 ml    Filed Weights   09/08/22 0605 09/09/22 0600 09/11/22 0600  Weight: 74.8 kg 74.2 kg 76.7 kg    Examination:  General exam: Appears  calm and comfortable, on room air, communicating well Respiratory system: Clear to auscultation. Respiratory effort normal. Cardiovascular system: S1 & S2 heard, RRR. No JVD, murmurs, rubs, gallops or clicks. No pedal edema. Gastrointestinal system: Abdomen is nondistended, soft and nontender. No organomegaly or masses felt. Normal bowel sounds heard. Central nervous system:  Alert and oriented. No focal neurological deficits. Extremities: Symmetric 5 x 5 power. Skin: Multiple bruises noted on the arms Psychiatry: Judgement and insight appear normal. Mood & affect appropriate.    Data Reviewed: I have personally reviewed following labs and imaging studies  CBC: Recent Labs  Lab 09/07/22 1111 09/07/22 1225 09/09/22 0932 09/10/22 0815 09/11/22 0302 09/11/22 1737 09/12/22 0121 09/13/22 0227  WBC 12.9*   < > 3.8* 3.0* 2.0*  --  3.2* 4.9  NEUTROABS 11.0*  --  3.2 2.4  --   --  2.1  --   HGB 10.0*   < > 8.3* 8.2* 7.4* 8.9* 8.0* 8.3*  HCT 27.9*   < > 23.4* 23.1* 21.6* 26.3* 22.0* 24.8*  MCV 98.9   < > 100.0 98.7 101.9*  --  95.2 98.0  PLT 302   < > 182 124* 100*  --  78* 95*   < > = values in this interval not displayed.    Basic Metabolic Panel: Recent Labs  Lab 09/10/22 0815 09/11/22 0302 09/11/22 1420 09/11/22 1737 09/12/22 0121 09/13/22 0227  NA 128* 128*  --  132* 131*  131* 133*  K 3.6 3.4*  --  3.9 4.2  4.1 3.9  CL 100 103  --  104 102  103 106  CO2 20* 19*  --  19* 19*  19* 21*  GLUCOSE 120* 101*  --  114* 104*  109* 132*  BUN 19 20  --  16 17  16 16   CREATININE 1.00 0.98  --  0.87 0.86  0.83 0.98  CALCIUM 8.2* 7.9*  --  8.0* 7.9*  7.9* 8.3*  MG  --   --  1.7  --   --   --   PHOS  --   --   --   --  2.8  --     GFR: Estimated Creatinine Clearance: 63.1 mL/min (by C-G formula based on SCr of 0.98 mg/dL). Liver Function Tests: Recent Labs  Lab 09/07/22 1111 09/12/22 0121  AST 39  --   ALT 54*  --   ALKPHOS 160*  --   BILITOT 0.6  --   PROT 6.8  --   ALBUMIN 3.7 2.2*    No results for input(s): "LIPASE", "AMYLASE" in the last 168 hours. No results for input(s): "AMMONIA" in the last 168 hours. Coagulation Profile: Recent Labs  Lab 09/07/22 1323  INR 1.2    Cardiac Enzymes: No results for input(s): "CKTOTAL", "CKMB", "CKMBINDEX", "TROPONINI" in the last 168 hours. BNP (last 3 results) No results for  input(s): "PROBNP" in the last 8760 hours. HbA1C: No results for input(s): "HGBA1C" in the last 72 hours. CBG: Recent Labs  Lab 09/11/22 2229 09/12/22 1200 09/12/22 1655 09/12/22 2123 09/13/22 0840  GLUCAP 145* 102* 140* 103* 101*    Lipid Profile: No results for input(s): "CHOL", "HDL", "LDLCALC", "TRIG", "CHOLHDL", "LDLDIRECT" in the last 72 hours. Thyroid Function Tests: Recent Labs    09/11/22 1420  TSH 2.144    Anemia Panel: No results for input(s): "VITAMINB12", "FOLATE", "FERRITIN", "TIBC", "IRON", "RETICCTPCT" in the last 72 hours. Sepsis Labs: Recent Labs  Lab 09/07/22 1315 09/07/22  1639 09/09/22 0932  PROCALCITON  --   --  0.10  LATICACIDVEN 1.9 1.6  --      Recent Results (from the past 240 hour(s))  Urine Culture (for pregnant, neutropenic or urologic patients or patients with an indwelling urinary catheter)     Status: Abnormal   Collection Time: 09/09/22  9:10 AM   Specimen: Urine, Catheterized  Result Value Ref Range Status   Specimen Description URINE, CATHETERIZED  Final   Special Requests   Final    Immunocompromised Performed at [facility], [place] [address], [place], [place] [address]    Culture >=100,000 COLONIES/mL ENTEROCOCCUS FAECALIS (A)  Final   Report Status 09/11/2022 FINAL  Final   Organism ID, Bacteria ENTEROCOCCUS FAECALIS (A)  Final      Susceptibility   Enterococcus faecalis - MIC*    AMPICILLIN <=2 SENSITIVE Sensitive     NITROFURANTOIN <=16 SENSITIVE Sensitive     VANCOMYCIN 1 SENSITIVE Sensitive     LINEZOLID 2 SENSITIVE Sensitive     * >=100,000 COLONIES/mL ENTEROCOCCUS FAECALIS  Culture, blood (Routine X 2) w Reflex to ID Panel     Status: Abnormal   Collection Time: 09/09/22  9:32 AM   Specimen: BLOOD RIGHT ARM  Result Value Ref Range Status   Specimen Description BLOOD RIGHT ARM  Final   Special Requests   Final    BOTTLES DRAWN AEROBIC AND ANAEROBIC Blood Culture adequate volume   Culture  Setup Time   Final     GRAM POSITIVE COCCI IN CHAINS IN BOTH AEROBIC AND ANAEROBIC BOTTLES Organism ID to follow CRITICAL RESULT CALLED TO, READ BACK BY AND VERIFIED WITH: T RUDISILL,[email or web address]  09/09/22 [place] Performed at [facility], [place] [address], [place], [place] [address]    Culture ENTEROCOCCUS FAECALIS (A)  Final   Report Status 09/11/2022 FINAL  Final   Organism ID, Bacteria ENTEROCOCCUS FAECALIS  Final      Susceptibility   Enterococcus faecalis - MIC*    AMPICILLIN <=2 SENSITIVE Sensitive     VANCOMYCIN 1 SENSITIVE Sensitive     GENTAMICIN SYNERGY SENSITIVE Sensitive     LINEZOLID 2 SENSITIVE Sensitive     * ENTEROCOCCUS FAECALIS  Blood Culture ID Panel (Reflexed)     Status: Abnormal   Collection Time: 09/09/22  9:32 AM  Result Value Ref Range Status   Enterococcus faecalis DETECTED (A) NOT DETECTED Final    Comment: CRITICAL RESULT CALLED TO, READ BACK BY AND VERIFIED WITH: T RUDISILL,[email or web address]  09/09/22 [place]    Enterococcus Faecium NOT DETECTED NOT DETECTED Final   Listeria monocytogenes NOT DETECTED NOT DETECTED Final   Staphylococcus species NOT DETECTED NOT DETECTED Final   Staphylococcus aureus (BCID) NOT DETECTED NOT DETECTED Final   Staphylococcus epidermidis NOT DETECTED NOT DETECTED Final   Staphylococcus lugdunensis NOT DETECTED NOT DETECTED Final   Streptococcus species NOT DETECTED NOT DETECTED Final   Streptococcus agalactiae NOT DETECTED NOT DETECTED Final   Streptococcus pneumoniae NOT DETECTED NOT DETECTED Final   Streptococcus pyogenes NOT DETECTED NOT DETECTED Final   A.calcoaceticus-baumannii NOT DETECTED NOT DETECTED Final   Bacteroides fragilis NOT DETECTED NOT DETECTED Final   Enterobacterales NOT DETECTED NOT DETECTED Final   Enterobacter cloacae complex NOT DETECTED NOT DETECTED Final   Escherichia coli NOT DETECTED NOT DETECTED Final   Klebsiella aerogenes NOT DETECTED NOT DETECTED Final   Klebsiella oxytoca NOT DETECTED NOT DETECTED Final   Klebsiella  pneumoniae NOT DETECTED NOT DETECTED Final   Proteus  species NOT DETECTED NOT DETECTED Final   Salmonella species NOT DETECTED NOT DETECTED Final   Serratia marcescens NOT DETECTED NOT DETECTED Final   Haemophilus influenzae NOT DETECTED NOT DETECTED Final   Neisseria meningitidis NOT DETECTED NOT DETECTED Final   Pseudomonas aeruginosa NOT DETECTED NOT DETECTED Final   Stenotrophomonas maltophilia NOT DETECTED NOT DETECTED Final   Candida albicans NOT DETECTED NOT DETECTED Final   Candida auris NOT DETECTED NOT DETECTED Final   Candida glabrata NOT DETECTED NOT DETECTED Final   Candida krusei NOT DETECTED NOT DETECTED Final   Candida parapsilosis NOT DETECTED NOT DETECTED Final   Candida tropicalis NOT DETECTED NOT DETECTED Final   Cryptococcus neoformans/gattii NOT DETECTED NOT DETECTED Final   Vancomycin resistance NOT DETECTED NOT DETECTED Final    Comment: Performed at [facility], [place] [address], [place], [place] [address]  Culture, blood (Routine X 2) w Reflex to ID Panel     Status: Abnormal   Collection Time: 09/09/22  9:34 AM   Specimen: BLOOD RIGHT ARM  Result Value Ref Range Status   Specimen Description BLOOD RIGHT ARM  Final   Special Requests   Final    BOTTLES DRAWN AEROBIC AND ANAEROBIC Blood Culture adequate volume   Culture  Setup Time   Final    GRAM POSITIVE COCCI IN BOTH AEROBIC AND ANAEROBIC BOTTLES CRITICAL VALUE NOTED.  VALUE IS CONSISTENT WITH PREVIOUSLY REPORTED AND CALLED VALUE.    Culture (A)  Final    ENTEROCOCCUS FAECALIS SUSCEPTIBILITIES PERFORMED ON PREVIOUS CULTURE WITHIN THE LAST 5 DAYS. Performed at [facility], [place] [address], [place], [place] [address]    Report Status 09/11/2022 FINAL  Final  Culture, blood (Routine X 2) w Reflex to ID Panel     Status: None (Preliminary result)   Collection Time: 09/10/22  8:15 AM   Specimen: BLOOD  Result Value Ref Range Status   Specimen Description BLOOD RIGHT ANTECUBITAL  Final    Special Requests   Final    BOTTLES DRAWN AEROBIC AND ANAEROBIC Blood Culture adequate volume   Culture   Final    NO GROWTH 3 DAYS Performed at [facility], [place] [address], [place], [place] [address]    Report Status PENDING  Incomplete  Culture, blood (Routine X 2) w Reflex to ID Panel     Status: None (Preliminary result)   Collection Time: 09/10/22  8:19 AM   Specimen: BLOOD  Result Value Ref Range Status   Specimen Description BLOOD RIGHT ANTECUBITAL  Final   Special Requests   Final    BOTTLES DRAWN AEROBIC AND ANAEROBIC Blood Culture adequate volume   Culture   Final    NO GROWTH 3 DAYS Performed at [facility], [place] [address] [place], [place] [address]    Report Status PENDING  Incomplete  Culture, blood (Routine X 2) w Reflex to ID Panel     Status: None (Preliminary result)   Collection Time: 09/11/22 11:18 AM   Specimen: BLOOD  Result Value Ref Range Status   Specimen Description BLOOD BLOOD RIGHT HAND  Final   Special Requests   Final    BOTTLES DRAWN AEROBIC AND ANAEROBIC Blood Culture adequate volume   Culture   Final    NO GROWTH 2 DAYS Performed at [facility], [place] [address], [place], [place] [address]    Report Status PENDING  Incomplete  Culture, blood (Routine X 2) w Reflex to ID  Panel     Status: None (Preliminary result)   Collection Time: 09/11/22 11:18 AM   Specimen: BLOOD  Result Value Ref Range Status   Specimen Description BLOOD LEFT ANTECUBITAL  Final   Special Requests   Final    BOTTLES DRAWN AEROBIC AND ANAEROBIC Blood Culture adequate volume   Culture   Final    NO GROWTH 2 DAYS Performed at [facility], [address] [place], [place] [address]    Report Status PENDING  Incomplete      Radiology Studies: No results found.  Scheduled Meds:  sodium chloride   Intravenous Once   aspirin  81 mg Oral Daily   Chlorhexidine Gluconate Cloth  6 each Topical Daily   insulin aspart  0-5 Units Subcutaneous QHS    insulin aspart  0-9 Units Subcutaneous TID WC   melatonin  3 mg Oral QHS   metoprolol tartrate  12.5 mg Oral BID   rosuvastatin  40 mg Oral Daily   senna-docusate  1 tablet Oral Daily   sodium chloride flush  3 mL Intravenous Q12H   tamsulosin  0.4 mg Oral BID   Thrombi-Pad  1 each Topical Once   ticagrelor  90 mg Oral BID   Continuous Infusions:  sodium chloride 10 mL/hr at 09/11/22 0824   sodium chloride     ampicillin (OMNIPEN) IV 2 g (09/13/22 0848)     LOS: 6 days   Time spent: 35 minutes   Franz Svec Loann Quill, MD [facility]  If 7PM-7AM, please contact night-coverage [email or web address] 09/13/2022, 9:23 AM

## 2022-09-14 ENCOUNTER — Inpatient Hospital Stay: Payer: Medicare Other

## 2022-09-14 LAB — CBC
HCT: 25.6 % — ABNORMAL LOW (ref 39.0–52.0)
Hemoglobin: 8.8 g/dL — ABNORMAL LOW (ref 13.0–17.0)
MCH: 33.5 pg (ref 26.0–34.0)
MCHC: 34.4 g/dL (ref 30.0–36.0)
MCV: 97.3 fL (ref 80.0–100.0)
Platelets: 107 10*3/uL — ABNORMAL LOW (ref 150–400)
RBC: 2.63 MIL/uL — ABNORMAL LOW (ref 4.22–5.81)
RDW: 15.2 % (ref 11.5–15.5)
WBC: 6.5 10*3/uL (ref 4.0–10.5)
nRBC: 0.6 % — ABNORMAL HIGH (ref 0.0–0.2)

## 2022-09-14 LAB — BASIC METABOLIC PANEL
Anion gap: 6 (ref 5–15)
BUN: 17 mg/dL (ref 8–23)
CO2: 21 mmol/L — ABNORMAL LOW (ref 22–32)
Calcium: 8.1 mg/dL — ABNORMAL LOW (ref 8.9–10.3)
Chloride: 107 mmol/L (ref 98–111)
Creatinine, Ser: 0.99 mg/dL (ref 0.61–1.24)
GFR, Estimated: >60 mL/min (ref >60)
Glucose, Bld: 124 mg/dL — ABNORMAL HIGH (ref 70–99)
Potassium: 3.6 mmol/L (ref 3.5–5.1)
Sodium: 134 mmol/L — ABNORMAL LOW (ref 135–145)

## 2022-09-14 LAB — GLUCOSE, CAPILLARY
Glucose-Capillary: 99 mg/dL (ref 70–99)
Glucose-Capillary: 104 mg/dL — ABNORMAL HIGH (ref 70–99)
Glucose-Capillary: 108 mg/dL — ABNORMAL HIGH (ref 70–99)
Glucose-Capillary: 104 mg/dL — ABNORMAL HIGH (ref 70–99)
Glucose-Capillary: 116 mg/dL — ABNORMAL HIGH (ref 70–99)

## 2022-09-14 NOTE — Hospital Course (Signed)
Brief Narrative:  Patient is a 77 year old male with metastatic prostate cancer on palliative chemotherapy who initially presented with acute inferior wall STEMI s/p cardiac cath and stent placement. Course was complicated with UTI/sepsis due to Enterococcus faecalis bacteremia.  Patient is currently on IV ampicillin.   Patient has had problems with urinary retention and, currently has Foley catheter in place.  [facility] assumed primary attending from 09/11/2022.  Oncology, urology, IR cardiology, infectious disease are consulted.  PCCM has signed off.   Assessment & Plan:   Inferior STEMI: -S/p LHC with distal occlusion of RCA with 4x DES -TTE 3/12 with EF 65-75%, mild hypokinesis the mid inferiorseptal segment as well as the mid and distal anterior septal segments; suspect anterior portion hypokinesis in setting of nonstented mid LAD stenosis  -Cardiology on board recommend to continue current medical management.  No plans for repeat LHC and stent placement until patient is symptomatic. -Continue aspirin, Brilinta for at least 12 months given status post 4 stents and high risk for occlusion.  - Continue metoprolol 12.5 mg twice daily, need conversion to Toprol when ready to discharge. -prior recommendation by Dr. Ellyn Hack for a  stress PET scan in ~1 month to evaluate left coronary system for ischemia based on the left main, LAD and LCx disease.    Enterococcus bacteremia: -Source is suspected urinary with Enterococcus faecalis.   -S/p port removal on 3/14 by IR -ID recommended 2 weeks course of ampicillin with a continuous infusion at home -ID do not think that he needs TEE given the abrupt onset of bacteremia   Metastatic neuroendocrine carcinoma Advanced prostate cancer: -Just finished chemotherapy a week ago.  Scheduled for CT imaging next week -Oncology on board.  Appreciate help   Acquired pancytopenia: Likely due to recent chemo -No need for platelet transfusion until less than 20,000 or  signs of bleeding. -G-CSF if ANC is less than 1.5 -Blood transfusion if hemoglobin is less than 8 -Overall numbers are improving   Urinary retention: -Status post Foley placement.  Continue Flomax -Appreciate urology help   Type 2 diabetes: A1c 6.8%.  Continue sliding scale insulin   Hyperlipidemia: Continue statin

## 2022-09-14 NOTE — Progress Notes (Addendum)
Rounding Note    Patient Name: Larry Kent Date of Encounter: 09/14/2022  [facility] Cardiologist: Kathlyn Sacramento, MD   Subjective   Patient resting comfortably in bed. No chest pain or shortness of breath.  Inpatient Medications    Scheduled Meds:  sodium chloride   Intravenous Once   aspirin  81 mg Oral Daily   Chlorhexidine Gluconate Cloth  6 each Topical Daily   insulin aspart  0-5 Units Subcutaneous QHS   insulin aspart  0-9 Units Subcutaneous TID WC   melatonin  3 mg Oral QHS   metoprolol tartrate  12.5 mg Oral BID   rosuvastatin  40 mg Oral Daily   senna-docusate  1 tablet Oral Daily   sodium chloride flush  3 mL Intravenous Q12H   tamsulosin  0.4 mg Oral BID   Thrombi-Pad  1 each Topical Once   ticagrelor  90 mg Oral BID   Continuous Infusions:  sodium chloride 10 mL/hr at 09/11/22 0824   sodium chloride     ampicillin (OMNIPEN) IV 2 g (09/14/22 0435)   PRN Meds: sodium chloride, acetaminophen, fentaNYL (SUBLIMAZE) injection, ondansetron (ZOFRAN) IV, mouth rinse, sodium chloride flush   Vital Signs    Vitals:   09/13/22 0841 09/13/22 0850 09/13/22 2012 09/14/22 0439  BP: 112/72  116/63 113/70  Pulse: 62 66 73 71  Resp:   18 18  Temp: 98.1 F (36.7 C)  98.7 F (37.1 C) 98.7 F (37.1 C)  TempSrc: Oral  Oral Oral  SpO2: 100%  94% 94%  Weight:      Height:        Intake/Output Summary (Last 24 hours) at 09/14/2022 0750 Last data filed at 09/14/2022 0439 Gross per 24 hour  Intake --  Output 3350 ml  Net -3350 ml      09/11/2022    6:00 AM 09/09/2022    6:00 AM 09/08/2022    6:05 AM  Last 3 Weights  Weight (lbs) 169 lb 1.5 oz 163 lb 9.3 oz 164 lb 14.5 oz  Weight (kg) 76.7 kg 74.2 kg 74.8 kg      Telemetry    Sinus rhythm - Personally Reviewed  ECG    No new tracing - Personally Reviewed  Physical Exam   GEN: No acute distress.   Neck: No JVD Cardiac: RRR, no rubs, or gallops. Harsh systolic murmur at RUSB Respiratory:  Clear to auscultation bilaterally. GI: Soft, nontender, non-distended  MS: No edema; No deformity. Neuro:  Nonfocal  Psych: Normal affect   Labs    High Sensitivity Troponin:   Recent Labs  Lab 09/07/22 1225 09/07/22 1639  TROPONINIHS 3,532* 7,318*     Chemistry Recent Labs  Lab 09/07/22 1111 09/07/22 1225 09/11/22 1420 09/11/22 1737 09/12/22 0121 09/13/22 0227 09/14/22 0235  NA 136   < >  --    < > 131*  131* 133* 134*  K 4.3   < >  --    < > 4.2  4.1 3.9 3.6  CL 104   < >  --    < > 102  103 106 107  CO2 24   < >  --    < > 19*  19* 21* 21*  GLUCOSE 160*   < >  --    < > 104*  109* 132* 124*  BUN 24*   < >  --    < > 17  16 16 17   CREATININE 0.88   < >  --    < >  0.86  0.83 0.98 0.99  CALCIUM 9.1   < >  --    < > 7.9*  7.9* 8.3* 8.1*  MG  --   --  1.7  --   --   --   --   PROT 6.8  --   --   --   --   --   --   ALBUMIN 3.7  --   --   --  2.2*  --   --   AST 39  --   --   --   --   --   --   ALT 54*  --   --   --   --   --   --   ALKPHOS 160*  --   --   --   --   --   --   BILITOT 0.6  --   --   --   --   --   --   GFRNONAA >60   < >  --    < > >60  >60 >60 >60  ANIONGAP 8   < >  --    < > 10  9 6 6    < > = values in this interval not displayed.    Lipids  Recent Labs  Lab 09/07/22 1323  CHOL 137  TRIG 55  HDL 62  LDLCALC 64  CHOLHDL 2.2    Hematology Recent Labs  Lab 09/12/22 0121 09/13/22 0227 09/14/22 0235  WBC 3.2* 4.9 6.5  RBC 2.31* 2.53* 2.63*  HGB 8.0* 8.3* 8.8*  HCT 22.0* 24.8* 25.6*  MCV 95.2 98.0 97.3  MCH 34.6* 32.8 33.5  MCHC 36.4* 33.5 34.4  RDW 15.6* 15.6* 15.2  PLT 78* 95* 107*   Thyroid  Recent Labs  Lab 09/11/22 1420  TSH 2.144    BNPNo results for input(s): "BNP", "PROBNP" in the last 168 hours.  DDimer No results for input(s): "DDIMER" in the last 168 hours.   Radiology    No results found.  Cardiac Studies   09/07/2021 ECHOCARDIOGRAM   IMPRESSIONS   1. Left ventricular ejection fraction, by  estimation, is 65 to 70%. The left ventricle has normal function. The left ventricle demonstrates regional wall motion abnormalities:The mid and distal anterior septum and mid inferoseptal segment are  hypokinetic. There is moderate concentric left ventricular hypertrophy. Left ventricular diastolic parameters are indeterminate.   2. Right ventricular systolic function is normal. The right ventricular size is moderately enlarged. There is normal pulmonary artery systolic pressure.   3. Right atrial size was mildly dilated.   4. The mitral valve is degenerative. No evidence of mitral valve regurgitation. No evidence of mitral stenosis. The mean mitral valve gradient is 3.0 mmHg with average heart rate of 66 bpm. Severe mitral annular calcification.   5. Mixed valve disease. Central aortic regurgitation, normal LV stroke volume index. The aortic valve is calcified. Aortic valve regurgitation is mild to moderate. Mild aortic valve stenosis. Aortic valve mean gradient measures 14.0 mmHg.   6. The inferior vena cava is normal in size with greater than 50% respiratory variability, suggesting right atrial pressure of 3 mmHg.    Cardiac cath-PCI RCA 09/07/2022: Diagnostic Dominance: Right  Intervention             09/23/2021 VAS US carotid duplex Summary:  Right Carotid: There is no evidence of stenosis in the right ICA.  Left Carotid: Velocities in the left ICA are consistent with a 1-39% stenosis.  Vertebrals: Right vertebral artery demonstrates antegrade flow. Left  vertebral artery demonstrates high resistant flow.  Subclavians: Normal flow hemodynamics were seen in bilateral subclavian arteries.    10/22/2020 ECHO complete LVEF 55-60%. Left ventricle with normal function. Moderate left ventricular hypertrophy. Grade II diastolic dysfunction. Elevated L atrial pressure R ventricle with normal function with mild enlargement.  Mitral valve  abnormal with no evidence of MV regurgitation or evidence of mitral stenosis Mild calcification of the aortic valve. Mild thickening. Trivial AV regurgitation  Patient Profile     77 y.o. male   Assessment & Plan    Inferior STEMI ST elevation leads II, III, and aVF  Patient is s/p LHC on 3/11 with distal occlusion of RCA, received DES x4. Also noted with distal LM, ostial left circumflex, distal and ostial LAD stenoses, not stented. TTE following LHC noted LVEF 65-70% with mild hypokinesis the mid inferiorseptal segment as well as the mid and distal anterior septal segments; suspect anterior portion hypokinesis in setting of nonstented mid LAD stenosis.  At this time, will continue plan for medical management of left coronary artery disease. Our interventional team feels that lesions in left main LAD and circumflex disease were not visually flow-limiting. Would need to consider repeat LHC if patient had symptoms. Tentative plans for repeat stress PET in 1 month.  ASA/Briltinta x12 months minimum with 4 stents this admission.  Continue Metoprolol Tartrate, can consolidate to long acting at discharge.  Enterococcus faecalis bacteremia E faecalis UTI Leukopenia  Patient with concern of possible bacterial infection prior to admission found with bacteremia, leukopenia. Continue abx per primary team/ID. Will discuss indication for TEE with MD but would plan to defer to ID regarding indication.       For questions or updates, please contact [place] Please consult [email or web address] for contact info under        Signed, Lily Kocher, PA-C  09/14/2022, 7:50 AM    I have examined the patient and reviewed assessment and plan and discussed with patient.  Agree with above as stated.    Right wrist site stable, 2+ radial pulse.  Reported some mild right groin soreness.  I examined this area.  No bleeding.  Tegaderm in place.  2+ right posterior tibial pulse.  Doing well from a  cardiac standpoint.  No angina with walking.  He is hoping to do more cardiac rehab today.  Will determine whether TEE needed based on ID recommendations.  Medical management of residual coronary artery disease.  Larae Grooms

## 2022-09-14 NOTE — Evaluation (Signed)
Physical Therapy Evaluation/ Discharge Patient Details Name: Larry Kent MRN: [id number] DOB: 02-22-1946 Today's Date: 09/14/2022  History of Present Illness  77 y.o. male admitted 3/11 with sTEMI s/p radial heart cath with DESx 4. pMHx: prostate CA , colon CA, carotid disease s/p CEA, HLD  Clinical Impression  Pt pleasant and reports living with wife and son and he is normally independent and enjoys golf. Pt able to perform transfers, gait and stairs without AD or assist for balance or strength. Pt without history of falls and has assist of family as needed. Pt educated for walking program and will defer to cardiac rehab. No further therapy needs at this time with pt aware and agreeable.        Recommendations for follow up therapy are one component of a multi-disciplinary discharge planning process, led by the attending physician.  Recommendations may be updated based on patient status, additional functional criteria and insurance authorization.  Follow Up Recommendations No PT follow up      Assistance Recommended at Discharge PRN  Patient can return home with the following       Equipment Recommendations None recommended by PT  Recommendations for Other Services       Functional Status Assessment Patient has not had a recent decline in their functional status     Precautions / Restrictions Precautions Precautions: None      Mobility  Bed Mobility Overal bed mobility: Modified Independent                  Transfers Overall transfer level: Modified independent                      Ambulation/Gait Ambulation/Gait assistance: Modified independent (Device/Increase time) Gait Distance (Feet): 300 Feet Assistive device: None Gait Pattern/deviations: Step-through pattern, Decreased stride length   Gait velocity interpretation: >2.62 ft/sec, indicative of community ambulatory   General Gait Details: steady gait with decreased speed with HR 90-109  during activity, no SOB  Stairs Stairs: Yes Stairs assistance: Modified independent (Device/Increase time) Stair Management: One rail Right, Alternating pattern, Forwards Number of Stairs: 10 General stair comments: pt completed stairs without difficulty with use of rail  Wheelchair Mobility    Modified Rankin (Stroke Patients Only)       Balance Overall balance assessment: No apparent balance deficits (not formally assessed)                                           Pertinent Vitals/Pain Pain Assessment Pain Assessment: No/denies pain    Home Living Family/patient expects to be discharged to:: Private residence Living Arrangements: Spouse/significant other Available Help at Discharge: Family;Available 24 hours/day Type of Home: House Home Access: Level entry       Home Layout: Two level;Bed/bath upstairs Home Equipment: None Additional Comments: was working part time until Feb    Prior Function Prior Level of Function : Independent/Modified Independent                     Journalist, newspaper        Extremity/Trunk Assessment   Upper Extremity Assessment Upper Extremity Assessment: Overall WFL for tasks assessed    Lower Extremity Assessment Lower Extremity Assessment: Overall WFL for tasks assessed    Cervical / Trunk Assessment Cervical / Trunk Assessment: Normal  Communication   Communication: No difficulties  Cognition  Arousal/Alertness: Awake/alert Behavior During Therapy: WFL for tasks assessed/performed Overall Cognitive Status: Within Functional Limits for tasks assessed                                          General Comments      Exercises     Assessment/Plan    PT Assessment Patient does not need any further PT services  PT Problem List         PT Treatment Interventions      PT Goals (Current goals can be found in the Care Plan section)  Acute Rehab PT Goals PT Goal Formulation: All  assessment and education complete, DC therapy    Frequency       Co-evaluation               AM-PAC PT "6 Clicks" Mobility  Outcome Measure Help needed turning from your back to your side while in a flat bed without using bedrails?: None Help needed moving from lying on your back to sitting on the side of a flat bed without using bedrails?: None Help needed moving to and from a bed to a chair (including a wheelchair)?: None Help needed standing up from a chair using your arms (e.g., wheelchair or bedside chair)?: None Help needed to walk in hospital room?: None Help needed climbing 3-5 steps with a railing? : None 6 Click Score: 24    End of Session Equipment Utilized During Treatment: Gait belt Activity Tolerance: Patient tolerated treatment well Patient left: in chair;with call bell/phone within reach Nurse Communication: Mobility status PT Visit Diagnosis: Other abnormalities of gait and mobility (R26.89)    Time: [id number] PT Time Calculation (min) (ACUTE ONLY): 25 min   Charges:   PT Evaluation $PT Eval Low Complexity: 1 Low          Laretha Luepke P, PT Acute Rehabilitation Services Office: [phone]   Lamarr Lulas 09/14/2022, 12:38 PM

## 2022-09-14 NOTE — Progress Notes (Signed)
Subjective: No new complaints   Antibiotics:  Anti-infectives (From admission, onward)    Start     Dose/Rate Route Frequency Ordered Stop   09/10/22 0100  ampicillin (OMNIPEN) 2 g in sodium chloride 0.9 % 100 mL IVPB        2 g 300 mL/hr over 20 Minutes Intravenous Every 4 hours 09/10/22 0007     09/09/22 1200  cefTRIAXone (ROCEPHIN) 2 g in sodium chloride 0.9 % 100 mL IVPB  Status:  Discontinued        2 g 200 mL/hr over 30 Minutes Intravenous Every 24 hours 09/09/22 1101 09/10/22 0007       Medications: Scheduled Meds:  sodium chloride   Intravenous Once   aspirin  81 mg Oral Daily   Chlorhexidine Gluconate Cloth  6 each Topical Daily   insulin aspart  0-5 Units Subcutaneous QHS   insulin aspart  0-9 Units Subcutaneous TID WC   melatonin  3 mg Oral QHS   metoprolol tartrate  12.5 mg Oral BID   rosuvastatin  40 mg Oral Daily   senna-docusate  1 tablet Oral Daily   sodium chloride flush  3 mL Intravenous Q12H   tamsulosin  0.4 mg Oral BID   Thrombi-Pad  1 each Topical Once   ticagrelor  90 mg Oral BID   Continuous Infusions:  sodium chloride 10 mL/hr at 09/11/22 0824   sodium chloride     ampicillin (OMNIPEN) IV 2 g (09/14/22 1000)   PRN Meds:.sodium chloride, acetaminophen, fentaNYL (SUBLIMAZE) injection, ondansetron (ZOFRAN) IV, mouth rinse, sodium chloride flush    Objective: Weight change:   Intake/Output Summary (Last 24 hours) at 09/14/2022 1318 Last data filed at 09/14/2022 0810 Gross per 24 hour  Intake --  Output 3450 ml  Net -3450 ml    Blood pressure (!) 116/92, pulse 81, temperature 98 F (36.7 C), temperature source Oral, resp. rate 17, height 5\' 9"  (1.753 m), weight 76.7 kg, SpO2 94 %. Temp:  [98 F (36.7 C)-98.7 F (37.1 C)] 98 F (36.7 C) (03/18 0808) Pulse Rate:  [70-81] 81 (03/18 0949) Resp:  [17-18] 17 (03/18 0808) BP: (113-130)/(63-92) 116/92 (03/18 0949) SpO2:  [94 %] 94 % (03/18 0439)  Physical Exam: Physical  Exam Constitutional:      Appearance: He is well-developed.  HENT:     Head: Normocephalic and atraumatic.  Eyes:     Conjunctiva/sclera: Conjunctivae normal.  Cardiovascular:     Rate and Rhythm: Normal rate and regular rhythm.     Heart sounds: Murmur heard.  Pulmonary:     Effort: Pulmonary effort is normal. No respiratory distress.     Breath sounds: Normal breath sounds. No stridor. No wheezing.  Abdominal:     General: There is no distension.     Palpations: Abdomen is soft.  Musculoskeletal:        General: Normal range of motion.     Cervical back: Normal range of motion and neck supple.  Skin:    General: Skin is warm and dry.     Findings: No erythema or rash.  Neurological:     General: No focal deficit present.     Mental Status: He is alert and oriented to person, place, and time.  Psychiatric:        Mood and Affect: Mood normal.        Behavior: Behavior normal.        Thought Content: Thought content normal.  Judgment: Judgment normal.      CBC:    BMET Recent Labs    09/13/22 0227 09/14/22 0235  NA 133* 134*  K 3.9 3.6  CL 106 107  CO2 21* 21*  GLUCOSE 132* 124*  BUN 16 17  CREATININE 0.98 0.99  CALCIUM 8.3* 8.1*      Liver Panel  Recent Labs    09/12/22 0121  ALBUMIN 2.2*       Sedimentation Rate No results for input(s): "ESRSEDRATE" in the last 72 hours. C-Reactive Protein No results for input(s): "CRP" in the last 72 hours.  Micro Results: Recent Results (from the past 720 hour(s))  Urine Culture (for pregnant, neutropenic or urologic patients or patients with an indwelling urinary catheter)     Status: None   Collection Time: 08/18/22  4:41 PM   Specimen: Urine, Catheterized  Result Value Ref Range Status   Specimen Description   Final    URINE, CATHETERIZED Performed at [facility] [address], [place], [place] [address]    Special Requests   Final    Immunocompromised Performed at  [facility], [place] [address], [place], [place] [address]    Culture   Final    NO GROWTH Performed at [facility], [place] [address], [place], [place] [address]    Report Status 08/19/2022 FINAL  Final  Culture, blood (routine x 2)     Status: None   Collection Time: 08/18/22  6:45 PM   Specimen: BLOOD  Result Value Ref Range Status   Specimen Description   Final    BLOOD RIGHT ANTECUBITAL Performed at [place] [address], [place], [place] [address]    Special Requests   Final    BOTTLES DRAWN AEROBIC AND ANAEROBIC Blood Culture adequate volume Performed at [facility] [address], [place], [place] [address]    Culture   Final    NO GROWTH 5 DAYS Performed at [facility], [place] [address] [place], [place] [address]    Report Status 08/23/2022 FINAL  Final  Culture, blood (routine x 2)     Status: None   Collection Time: 08/18/22  6:55 PM   Specimen: BLOOD  Result Value Ref Range Status   Specimen Description   Final    BLOOD LEFT ANTECUBITAL Performed at [place] [address] [place], [place] [address]    Special Requests   Final    BOTTLES DRAWN AEROBIC AND ANAEROBIC Blood Culture results may not be optimal due to an excessive volume of blood received in culture bottles Performed at [place] [address], [place], [place] [address]    Culture   Final    NO GROWTH 5 DAYS Performed at [facility], [place] [address], [place], [place] [address]    Report Status 08/23/2022 FINAL  Final  Blood Culture (routine x 2)     Status: None   Collection Time: 08/21/22 11:40 PM   Specimen: BLOOD  Result Value Ref Range Status   Specimen Description   Final    BLOOD BLOOD LEFT FOREARM Performed at [facility] [address] [place], [place] [address]    Special Requests   Final    BOTTLES DRAWN AEROBIC AND ANAEROBIC Blood  Culture adequate volume Performed at [place] [address] [place], [place] [address]    Culture   Final  NO GROWTH 5 DAYS Performed at [facility], [place] [address], [place], [place] [address]    Report Status 08/27/2022 FINAL  Final  Blood Culture (routine x 2)     Status: None   Collection Time: 08/21/22 11:51 PM   Specimen: BLOOD  Result Value Ref Range Status   Specimen Description   Final    BLOOD BLOOD LEFT FOREARM Performed at [place] [address] [place], [place] [address]    Special Requests   Final    BOTTLES DRAWN AEROBIC AND ANAEROBIC Blood Culture adequate volume Performed at [facility] [address] [place], [place] [address]    Culture   Final    NO GROWTH 5 DAYS Performed at [facility], [place] [address] [place], [place] [address]    Report Status 08/27/2022 FINAL  Final  Resp panel by RT-PCR (RSV, Flu A&B, Covid) Anterior Nasal Swab     Status: None   Collection Time: 08/22/22  1:11 AM   Specimen: Anterior Nasal Swab  Result Value Ref Range Status   SARS Coronavirus 2 by RT PCR NEGATIVE NEGATIVE Final    Comment: (NOTE) SARS-CoV-2 target nucleic acids are NOT DETECTED.  The SARS-CoV-2 RNA is generally detectable in upper respiratory specimens during the acute phase of infection. The lowest concentration of SARS-CoV-2 viral copies this assay can detect is 138 copies/mL. A negative result does not preclude SARS-Cov-2 infection and should not be used as the sole basis for treatment or other patient management decisions. A negative result may occur with  improper specimen collection/handling, submission of specimen other than nasopharyngeal swab, presence of viral mutation(s) within the areas targeted by this assay, and inadequate number of viral copies(<138 copies/mL). A negative result must be combined with clinical observations, patient history, and  epidemiological information. The expected result is Negative.  Fact Sheet for Patients:  EntrepreneurPulse.com.au  Fact Sheet for Healthcare Providers:  IncredibleEmployment.be  This test is no t yet approved or cleared by the [place] FDA and  has been authorized for detection and/or diagnosis of SARS-CoV-2 by FDA under an Emergency Use Authorization (EUA). This EUA will remain  in effect (meaning this test can be used) for the duration of the COVID-19 declaration under Section 564(b)(1) of the Act, 21 U.S.C.section 360bbb-3(b)(1), unless the authorization is terminated  or revoked sooner.       Influenza A by PCR NEGATIVE NEGATIVE Final   Influenza B by PCR NEGATIVE NEGATIVE Final    Comment: (NOTE) The Xpert Xpress SARS-CoV-2/FLU/RSV plus assay is intended as an aid in the diagnosis of influenza from Nasopharyngeal swab specimens and should not be used as a sole basis for treatment. Nasal washings and aspirates are unacceptable for Xpert Xpress SARS-CoV-2/FLU/RSV testing.  Fact Sheet for Patients: EntrepreneurPulse.com.au  Fact Sheet for Healthcare Providers: IncredibleEmployment.be  This test is not yet approved or cleared by the [place] FDA and has been authorized for detection and/or diagnosis of SARS-CoV-2 by FDA under an Emergency Use Authorization (EUA). This EUA will remain in effect (meaning this test can be used) for the duration of the COVID-19 declaration under Section 564(b)(1) of the Act, 21 U.S.C. section 360bbb-3(b)(1), unless the authorization is terminated or revoked.     Resp Syncytial Virus by PCR NEGATIVE NEGATIVE Final    Comment: (NOTE) Fact Sheet for Patients: EntrepreneurPulse.com.au  Fact Sheet for Healthcare Providers: IncredibleEmployment.be  This test is not yet approved or cleared by the [place]  States FDA and has been  authorized for detection and/or diagnosis of SARS-CoV-2 by FDA under an Emergency Use Authorization (EUA). This EUA will remain in effect (meaning this test can be used) for the duration of the COVID-19 declaration under Section 564(b)(1) of the Act, 21 U.S.C. section 360bbb-3(b)(1), unless the authorization is terminated or revoked.  Performed at [facility], [place] [address], [place], [place] [address]   Urine Culture (for pregnant, neutropenic or urologic patients or patients with an indwelling urinary catheter)     Status: None   Collection Time: 08/22/22  1:16 AM   Specimen: Urine, Clean Catch  Result Value Ref Range Status   Specimen Description   Final    URINE, CLEAN CATCH Performed at [facility], [place] [address] [place], [place] [address]    Special Requests   Final    NONE Performed at [facility], [place] [address] [place], [place] [address]    Culture   Final    NO GROWTH Performed at [facility], [place] [address] [place], [place] [address]    Report Status 08/23/2022 FINAL  Final  Urine Culture (for pregnant, neutropenic or urologic patients or patients with an indwelling urinary catheter)     Status: Abnormal   Collection Time: 09/09/22  9:10 AM   Specimen: Urine, Catheterized  Result Value Ref Range Status   Specimen Description URINE, CATHETERIZED  Final   Special Requests   Final    Immunocompromised Performed at [facility], [place] [address] [place], [place] [address]    Culture >=100,000 COLONIES/mL ENTEROCOCCUS FAECALIS (A)  Final   Report Status 09/11/2022 FINAL  Final   Organism ID, Bacteria ENTEROCOCCUS FAECALIS (A)  Final      Susceptibility   Enterococcus faecalis - MIC*    AMPICILLIN <=2 SENSITIVE Sensitive     NITROFURANTOIN <=16 SENSITIVE Sensitive     VANCOMYCIN 1 SENSITIVE Sensitive     LINEZOLID 2 SENSITIVE Sensitive     * >=100,000 COLONIES/mL ENTEROCOCCUS FAECALIS   Culture, blood (Routine X 2) w Reflex to ID Panel     Status: Abnormal   Collection Time: 09/09/22  9:32 AM   Specimen: BLOOD RIGHT ARM  Result Value Ref Range Status   Specimen Description BLOOD RIGHT ARM  Final   Special Requests   Final    BOTTLES DRAWN AEROBIC AND ANAEROBIC Blood Culture adequate volume   Culture  Setup Time   Final    GRAM POSITIVE COCCI IN CHAINS IN BOTH AEROBIC AND ANAEROBIC BOTTLES Organism ID to follow CRITICAL RESULT CALLED TO, READ BACK BY AND VERIFIED WITH: T [email or web address]  09/09/22 [place] Performed at [facility], [place] [address], [place],  [address]    Culture ENTEROCOCCUS FAECALIS (A)  Final   Report Status 09/11/2022 FINAL  Final   Organism ID, Bacteria ENTEROCOCCUS FAECALIS  Final      Susceptibility   Enterococcus faecalis - MIC*    AMPICILLIN <=2 SENSITIVE Sensitive     VANCOMYCIN 1 SENSITIVE Sensitive     GENTAMICIN SYNERGY SENSITIVE Sensitive     LINEZOLID 2 SENSITIVE Sensitive     * ENTEROCOCCUS FAECALIS  Blood Culture ID Panel (Reflexed)     Status: Abnormal   Collection Time: 09/09/22  9:32 AM  Result Value Ref Range Status   Enterococcus faecalis DETECTED (A) NOT DETECTED Final    Comment: CRITICAL RESULT CALLED TO, READ BACK BY AND VERIFIED WITH: T RUDISILL,[email or web address]   09/09/22 [place]    Enterococcus Faecium NOT DETECTED NOT DETECTED Final   Listeria monocytogenes NOT DETECTED NOT DETECTED Final   Staphylococcus species NOT DETECTED NOT DETECTED Final   Staphylococcus aureus (BCID) NOT DETECTED NOT DETECTED Final   Staphylococcus epidermidis NOT DETECTED NOT DETECTED Final   Staphylococcus lugdunensis NOT DETECTED NOT DETECTED Final   Streptococcus species NOT DETECTED NOT DETECTED Final   Streptococcus agalactiae NOT DETECTED NOT DETECTED Final   Streptococcus pneumoniae NOT DETECTED NOT DETECTED Final   Streptococcus pyogenes NOT DETECTED NOT DETECTED Final   A.calcoaceticus-baumannii NOT DETECTED NOT DETECTED Final    Bacteroides fragilis NOT DETECTED NOT DETECTED Final   Enterobacterales NOT DETECTED NOT DETECTED Final   Enterobacter cloacae complex NOT DETECTED NOT DETECTED Final   Escherichia coli NOT DETECTED NOT DETECTED Final   Klebsiella aerogenes NOT DETECTED NOT DETECTED Final   Klebsiella oxytoca NOT DETECTED NOT DETECTED Final   Klebsiella pneumoniae NOT DETECTED NOT DETECTED Final   Proteus species NOT DETECTED NOT DETECTED Final   Salmonella species NOT DETECTED NOT DETECTED Final   Serratia marcescens NOT DETECTED NOT DETECTED Final   Haemophilus influenzae NOT DETECTED NOT DETECTED Final   Neisseria meningitidis NOT DETECTED NOT DETECTED Final   Pseudomonas aeruginosa NOT DETECTED NOT DETECTED Final   Stenotrophomonas maltophilia NOT DETECTED NOT DETECTED Final   Candida albicans NOT DETECTED NOT DETECTED Final   Candida auris NOT DETECTED NOT DETECTED Final   Candida glabrata NOT DETECTED NOT DETECTED Final   Candida krusei NOT DETECTED NOT DETECTED Final   Candida parapsilosis NOT DETECTED NOT DETECTED Final   Candida tropicalis NOT DETECTED NOT DETECTED Final   Cryptococcus neoformans/gattii NOT DETECTED NOT DETECTED Final   Vancomycin resistance NOT DETECTED NOT DETECTED Final    Comment: Performed at [facility], [address] [place], [place] [address]  Culture, blood (Routine X 2) w Reflex to ID Panel     Status: Abnormal   Collection Time: 09/09/22  9:34 AM   Specimen: BLOOD RIGHT ARM  Result Value Ref Range Status   Specimen Description BLOOD RIGHT ARM  Final   Special Requests   Final    BOTTLES DRAWN AEROBIC AND ANAEROBIC Blood Culture adequate volume   Culture  Setup Time   Final    GRAM POSITIVE COCCI IN BOTH AEROBIC AND ANAEROBIC BOTTLES CRITICAL VALUE NOTED.  VALUE IS CONSISTENT WITH PREVIOUSLY REPORTED AND CALLED VALUE.    Culture (A)  Final    ENTEROCOCCUS FAECALIS SUSCEPTIBILITIES PERFORMED ON PREVIOUS CULTURE WITHIN THE LAST 5 DAYS. Performed at  [facility], [place] [address], [place], [place] [address]    Report Status 09/11/2022 FINAL  Final  Culture, blood (Routine X 2) w Reflex to ID Panel     Status: None (Preliminary result)   Collection Time: 09/10/22  8:15 AM   Specimen: BLOOD  Result Value Ref Range Status   Specimen Description BLOOD RIGHT ANTECUBITAL  Final   Special Requests   Final    BOTTLES DRAWN AEROBIC AND ANAEROBIC Blood Culture adequate volume   Culture   Final    NO GROWTH 4 DAYS Performed at [facility], [place] [address] [place] [address], [place], [place] [address]    Report Status PENDING  Incomplete  Culture, blood (Routine X 2) w Reflex to ID Panel     Status: None (Preliminary result)   Collection Time: 09/10/22  8:19 AM   Specimen: BLOOD  Result Value Ref Range Status  Specimen Description BLOOD RIGHT ANTECUBITAL  Final   Special Requests   Final    BOTTLES DRAWN AEROBIC AND ANAEROBIC Blood Culture adequate volume   Culture   Final    NO GROWTH 4 DAYS Performed at [facility], [address] [place], [place] [address]    Report Status PENDING  Incomplete  Culture, blood (Routine X 2) w Reflex to ID Panel     Status: None (Preliminary result)   Collection Time: 09/11/22 11:18 AM   Specimen: BLOOD  Result Value Ref Range Status   Specimen Description BLOOD BLOOD RIGHT HAND  Final   Special Requests   Final    BOTTLES DRAWN AEROBIC AND ANAEROBIC Blood Culture adequate volume   Culture   Final    NO GROWTH 3 DAYS Performed at [facility], [place] [address], [place],  [address]    Report Status PENDING  Incomplete  Culture, blood (Routine X 2) w Reflex to ID Panel     Status: None (Preliminary result)   Collection Time: 09/11/22 11:18 AM   Specimen: BLOOD  Result Value Ref Range Status   Specimen Description BLOOD LEFT ANTECUBITAL  Final   Special Requests   Final    BOTTLES DRAWN AEROBIC AND ANAEROBIC Blood Culture adequate volume   Culture   Final    NO GROWTH 3  DAYS Performed at [facility], [place] [address] [place] [address], [place],  [address]    Report Status PENDING  Incomplete    Studies/Results: No results found.    Assessment/Plan:  INTERVAL HISTORY: Blood cultures after port removal have been no growth  Principal Problem:   Enterococcal bacteremia Active Problems:   Hyperlipidemia with target LDL less than 70   Prediabetes   Cerebrovascular disease   Indwelling urinary catheter present   Acute ST elevation myocardial infarction (STEMI) of inferior wall (HCC)   Coronary artery disease involving native coronary artery of native heart with unstable angina pectoris (HCC)   Left main coronary artery disease   Hematuria   Fever   S/P coronary artery stent placement   Port or reservoir infection   Catheter-associated urinary tract infection ([place])    Larry Kent is a 77 y.o. 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 Source is suspected urinary with Enterococcus faecalis growing 100,000 colony-forming units from urine and also in his blood.  He has now had his port removed which will help [place] achieve cure.  I do not think he needs to have a TEE given the abrupt onset of his bacteremia.   I have further discussed with my ID pharmacist, Jimmy Footman and Janene Madeira, NP and I feel that this patient IS appropriate to complete antibiotic course with high dose po antibiotics.  To be seen by urology today with regards to his Foley catheter and consideration of removal.  I will keep him on ampicillin for now but as he nears discharge we will switch him over to high-dose amoxicillin  Then bring him back to clinic to check surveillance blood cultures 2 weeks after he completes oral antibiotics  I spent 54  minutes with the patient including than 50% of the time in  face to face counseling of the patient guarding his enterococcal bacteremia, history of port in place, along with review of medical records in preparation for the visit and  during the visit and in coordination of his care.    LOS: 7 days   Alcide Evener 09/14/2022, 1:18 PM

## 2022-09-14 NOTE — Progress Notes (Signed)
CARDIAC REHAB PHASE I   PRE:  Rate/Rhythm: 86 NSR  BP:  Sitting: 116/92      SaO2: 97 RA  MODE:  Ambulation: 70 ft   AD:  RE  POST:  Rate/Rhythm: 106 ST  BP:  Sitting: 128/74      SaO2: 96 RA  Pt amb with contact guard assistance using gait belt, pt denies CP and SOB during amb and was returned to room w/o complaint.    Christen Bame  11:04 AM 09/14/2022    Service time is from 1005 to 1107.

## 2022-09-14 NOTE — Care Management Important Message (Signed)
Important Message  Patient Details  Name: Larry Kent MRN: [id number] Date of Birth: [birth year]   Medicare Important Message Given:  Yes     [name] 09/14/2022, 9:28 AM

## 2022-09-14 NOTE — Progress Notes (Signed)
PROGRESS NOTE    Larry Kent  [id number] DOB: 09-Apr-1946 DOA: 09/07/2022 PCP: Ria Bush, MD   Brief Narrative:  Patient is a 77 year old male with metastatic prostate cancer on palliative chemotherapy who initially presented with acute inferior wall STEMI s/p cardiac cath and stent placement. Course was complicated with UTI/sepsis due to Enterococcus faecalis bacteremia.  Patient was seen by infectious disease and patient is currently on IV ampicillin.   Patient has had problems with urinary retention and, currently has Foley catheter in place.  [facility] assumed primary attending from 09/11/2022.  Oncology, urology, IR cardiology, infectious disease were consulted.  PCCM has signed off.  Assessment & Plan:   Inferior STEMI: -S/p LHC with distal occlusion of RCA with 4x DES.  2D echocardiogram on 09/08/2022 with LV ejection fraction of 65-75%, mild hypokinesis the mid inferiorseptal segment as well as the mid and distal anterior septal segments; suspect anterior portion hypokinesis in setting of nonstented mid LAD stenosis.  Cardiology on board and no plans for repeat left heart cath.  Continue aspirin and Brilinta for 12 months and continue metoprolol.  Will need to change to Toprol on discharge.  Follow-up recommended with Dr. Ellyn Hack with a stress PET scan in 1 month to evaluate for left coronary disease.  Enterococcus bacteremia: Likely urinary source.-S/p port removal on 3/14 by IR.  ID recommends 2 weeks infusion on discharge at home with IV.  No recommendation for TEE  Metastatic neuroendocrine carcinoma Advanced prostate cancer: Status post chemo a week back..  Scheduled for CT imaging next week -Oncology on board.  Appreciate help  Acquired pancytopenia: Likely secondary to recent chemotherapy.  Urinary retention: -Status post Foley placement.  Continue Flomax.  Urology recommends voiding trial in-house versus outpatient.  Type 2 diabetes mellitus: Latest hemoglobin A1c  6.8%.  Continue sliding scale insulin  Hyperlipidemia: Continue statin  DVT prophylaxis: SCD  Code Status: Full code  Family Communication: Spoke with the patient's family at bedside.   Disposition Plan: Will get PT evaluation.  Likely home in 1 to 2 days.  Consultants:  Cardiology  IR ID Urology Oncology  Procedures:  Port removal  Antimicrobials:  Ampicillin  Status is: Inpatient    Subjective: Today, patient was seen and examined at bedside.  Patient denies any nausea vomiting fever chills or rigor.  Has been having catheter for urination.   Objective: Vitals:   09/13/22 2012 09/14/22 0439 09/14/22 0808 09/14/22 0949  BP: 116/63 113/70 130/73 (!) 116/92  Pulse: 73 71 70 81  Resp: 18 18 17    Temp: 98.7 F (37.1 C) 98.7 F (37.1 C) 98 F (36.7 C)   TempSrc: Oral Oral Oral   SpO2: 94% 94%    Weight:      Height:        Intake/Output Summary (Last 24 hours) at 09/14/2022 1037 Last data filed at 09/14/2022 0810 Gross per 24 hour  Intake --  Output 3450 ml  Net -3450 ml    Filed Weights   09/08/22 0605 09/09/22 0600 09/11/22 0600  Weight: 74.8 kg 74.2 kg 76.7 kg    Physical examination: Body mass index is 24.97 kg/m.   General:  Average built, not in obvious distress HENT:   No scleral pallor or icterus noted. Oral mucosa is moist.  Chest:  Clear breath sounds.  Diminished breath sounds bilaterally. No crackles or wheezes.  CVS: S1 &S2 heard. No murmur.  Regular rate and rhythm. Abdomen: Soft, nontender, nondistended.  Bowel sounds are heard.  Foley catheter in place. Extremities: No cyanosis, clubbing or edema.  Peripheral pulses are palpable. Psych: Alert, awake and oriented, normal mood CNS:  No cranial nerve deficits.  Power equal in all extremities.   Skin: Warm and dry.  No rashes noted.   Data Reviewed: I have personally reviewed the following labs and imaging studies.    CBC: Recent Labs  Lab 09/07/22 1111 09/07/22 1225  09/09/22 0932 09/10/22 0815 09/11/22 0302 09/11/22 1737 09/12/22 0121 09/13/22 0227 09/14/22 0235  WBC 12.9*   < > 3.8* 3.0* 2.0*  --  3.2* 4.9 6.5  NEUTROABS 11.0*  --  3.2 2.4  --   --  2.1  --   --   HGB 10.0*   < > 8.3* 8.2* 7.4* 8.9* 8.0* 8.3* 8.8*  HCT 27.9*   < > 23.4* 23.1* 21.6* 26.3* 22.0* 24.8* 25.6*  MCV 98.9   < > 100.0 98.7 101.9*  --  95.2 98.0 97.3  PLT 302   < > 182 124* 100*  --  78* 95* 107*   < > = values in this interval not displayed.    Basic Metabolic Panel: Recent Labs  Lab 09/11/22 0302 09/11/22 1420 09/11/22 1737 09/12/22 0121 09/13/22 0227 09/14/22 0235  NA 128*  --  132* 131*  131* 133* 134*  K 3.4*  --  3.9 4.2  4.1 3.9 3.6  CL 103  --  104 102  103 106 107  CO2 19*  --  19* 19*  19* 21* 21*  GLUCOSE 101*  --  114* 104*  109* 132* 124*  BUN 20  --  16 17  16 16 17   CREATININE 0.98  --  0.87 0.86  0.83 0.98 0.99  CALCIUM 7.9*  --  8.0* 7.9*  7.9* 8.3* 8.1*  MG  --  1.7  --   --   --   --   PHOS  --   --   --  2.8  --   --     GFR: Estimated Creatinine Clearance: 62.5 mL/min (by C-G formula based on SCr of 0.99 mg/dL). Liver Function Tests: Recent Labs  Lab 09/07/22 1111 09/12/22 0121  AST 39  --   ALT 54*  --   ALKPHOS 160*  --   BILITOT 0.6  --   PROT 6.8  --   ALBUMIN 3.7 2.2*    No results for input(s): "LIPASE", "AMYLASE" in the last 168 hours. No results for input(s): "AMMONIA" in the last 168 hours. Coagulation Profile: Recent Labs  Lab 09/07/22 1323  INR 1.2    Cardiac Enzymes: No results for input(s): "CKTOTAL", "CKMB", "CKMBINDEX", "TROPONINI" in the last 168 hours. BNP (last 3 results) No results for input(s): "PROBNP" in the last 8760 hours. HbA1C: No results for input(s): "HGBA1C" in the last 72 hours. CBG: Recent Labs  Lab 09/13/22 0840 09/13/22 1253 09/13/22 1750 09/13/22 2132 09/14/22 0806  GLUCAP 101* 91 134* 137* 104*    Lipid Profile: No results for input(s): "CHOL", "HDL",  "LDLCALC", "TRIG", "CHOLHDL", "LDLDIRECT" in the last 72 hours. Thyroid Function Tests: Recent Labs    09/11/22 1420  TSH 2.144    Anemia Panel: No results for input(s): "VITAMINB12", "FOLATE", "FERRITIN", "TIBC", "IRON", "RETICCTPCT" in the last 72 hours. Sepsis Labs: Recent Labs  Lab 09/07/22 1315 09/07/22 1639 09/09/22 0932  PROCALCITON  --   --  0.10  LATICACIDVEN 1.9 1.6  --      Recent Results (from the  past 240 hour(s))  Urine Culture (for pregnant, neutropenic or urologic patients or patients with an indwelling urinary catheter)     Status: Abnormal   Collection Time: 09/09/22  9:10 AM   Specimen: Urine, Catheterized  Result Value Ref Range Status   Specimen Description URINE, CATHETERIZED  Final   Special Requests   Final    Immunocompromised Performed at [facility], [place] [address], [place], [place] [address]    Culture >=100,000 COLONIES/mL ENTEROCOCCUS FAECALIS (A)  Final   Report Status 09/11/2022 FINAL  Final   Organism ID, Bacteria ENTEROCOCCUS FAECALIS (A)  Final      Susceptibility   Enterococcus faecalis - MIC*    AMPICILLIN <=2 SENSITIVE Sensitive     NITROFURANTOIN <=16 SENSITIVE Sensitive     VANCOMYCIN 1 SENSITIVE Sensitive     LINEZOLID 2 SENSITIVE Sensitive     * >=100,000 COLONIES/mL ENTEROCOCCUS FAECALIS  Culture, blood (Routine X 2) w Reflex to ID Panel     Status: Abnormal   Collection Time: 09/09/22  9:32 AM   Specimen: BLOOD RIGHT ARM  Result Value Ref Range Status   Specimen Description BLOOD RIGHT ARM  Final   Special Requests   Final    BOTTLES DRAWN AEROBIC AND ANAEROBIC Blood Culture adequate volume   Culture  Setup Time   Final    GRAM POSITIVE COCCI IN CHAINS IN BOTH AEROBIC AND ANAEROBIC BOTTLES Organism ID to follow CRITICAL RESULT CALLED TO, READ BACK BY AND VERIFIED WITH: T RUDISILL,[email or web address]  09/09/22 [place] Performed at [facility], [place] [address], [place], [place] [address]    Culture ENTEROCOCCUS FAECALIS  (A)  Final   Report Status 09/11/2022 FINAL  Final   Organism ID, Bacteria ENTEROCOCCUS FAECALIS  Final      Susceptibility   Enterococcus faecalis - MIC*    AMPICILLIN <=2 SENSITIVE Sensitive     VANCOMYCIN 1 SENSITIVE Sensitive     GENTAMICIN SYNERGY SENSITIVE Sensitive     LINEZOLID 2 SENSITIVE Sensitive     * ENTEROCOCCUS FAECALIS  Blood Culture ID Panel (Reflexed)     Status: Abnormal   Collection Time: 09/09/22  9:32 AM  Result Value Ref Range Status   Enterococcus faecalis DETECTED (A) NOT DETECTED Final    Comment: CRITICAL RESULT CALLED TO, READ BACK BY AND VERIFIED WITH: T RUDISILL,[email or web address]  09/09/22 [place]    Enterococcus Faecium NOT DETECTED NOT DETECTED Final   Listeria monocytogenes NOT DETECTED NOT DETECTED Final   Staphylococcus species NOT DETECTED NOT DETECTED Final   Staphylococcus aureus (BCID) NOT DETECTED NOT DETECTED Final   Staphylococcus epidermidis NOT DETECTED NOT DETECTED Final   Staphylococcus lugdunensis NOT DETECTED NOT DETECTED Final   Streptococcus species NOT DETECTED NOT DETECTED Final   Streptococcus agalactiae NOT DETECTED NOT DETECTED Final   Streptococcus pneumoniae NOT DETECTED NOT DETECTED Final   Streptococcus pyogenes NOT DETECTED NOT DETECTED Final   A.calcoaceticus-baumannii NOT DETECTED NOT DETECTED Final   Bacteroides fragilis NOT DETECTED NOT DETECTED Final   Enterobacterales NOT DETECTED NOT DETECTED Final   Enterobacter cloacae complex NOT DETECTED NOT DETECTED Final   Escherichia coli NOT DETECTED NOT DETECTED Final   Klebsiella aerogenes NOT DETECTED NOT DETECTED Final   Klebsiella oxytoca NOT DETECTED NOT DETECTED Final   Klebsiella pneumoniae NOT DETECTED NOT DETECTED Final   Proteus species NOT DETECTED NOT DETECTED Final   Salmonella species NOT DETECTED NOT DETECTED Final   Serratia marcescens NOT DETECTED NOT DETECTED Final   Haemophilus influenzae  NOT DETECTED NOT DETECTED Final   Neisseria meningitidis NOT DETECTED NOT  DETECTED Final   Pseudomonas aeruginosa NOT DETECTED NOT DETECTED Final   Stenotrophomonas maltophilia NOT DETECTED NOT DETECTED Final   Candida albicans NOT DETECTED NOT DETECTED Final   Candida auris NOT DETECTED NOT DETECTED Final   Candida glabrata NOT DETECTED NOT DETECTED Final   Candida krusei NOT DETECTED NOT DETECTED Final   Candida parapsilosis NOT DETECTED NOT DETECTED Final   Candida tropicalis NOT DETECTED NOT DETECTED Final   Cryptococcus neoformans/gattii NOT DETECTED NOT DETECTED Final   Vancomycin resistance NOT DETECTED NOT DETECTED Final    Comment: Performed at [facility], [place] [address] [place], [place] [address]  Culture, blood (Routine X 2) w Reflex to ID Panel     Status: Abnormal   Collection Time: 09/09/22  9:34 AM   Specimen: BLOOD RIGHT ARM  Result Value Ref Range Status   Specimen Description BLOOD RIGHT ARM  Final   Special Requests   Final    BOTTLES DRAWN AEROBIC AND ANAEROBIC Blood Culture adequate volume   Culture  Setup Time   Final    GRAM POSITIVE COCCI IN BOTH AEROBIC AND ANAEROBIC BOTTLES CRITICAL VALUE NOTED.  VALUE IS CONSISTENT WITH PREVIOUSLY REPORTED AND CALLED VALUE.    Culture (A)  Final    ENTEROCOCCUS FAECALIS SUSCEPTIBILITIES PERFORMED ON PREVIOUS CULTURE WITHIN THE LAST 5 DAYS. Performed at [facility], [place] [address], [place], [place] [address]    Report Status 09/11/2022 FINAL  Final  Culture, blood (Routine X 2) w Reflex to ID Panel     Status: None (Preliminary result)   Collection Time: 09/10/22  8:15 AM   Specimen: BLOOD  Result Value Ref Range Status   Specimen Description BLOOD RIGHT ANTECUBITAL  Final   Special Requests   Final    BOTTLES DRAWN AEROBIC AND ANAEROBIC Blood Culture adequate volume   Culture   Final    NO GROWTH 4 DAYS Performed at [facility], [place] [address] [place], [place] [address]    Report Status PENDING  Incomplete  Culture, blood (Routine X 2) w Reflex to ID Panel      Status: None (Preliminary result)   Collection Time: 09/10/22  8:19 AM   Specimen: BLOOD  Result Value Ref Range Status   Specimen Description BLOOD RIGHT ANTECUBITAL  Final   Special Requests   Final    BOTTLES DRAWN AEROBIC AND ANAEROBIC Blood Culture adequate volume   Culture   Final    NO GROWTH 4 DAYS Performed at [facility], [place] [address], [place], [place] [address]    Report Status PENDING  Incomplete  Culture, blood (Routine X 2) w Reflex to ID Panel     Status: None (Preliminary result)   Collection Time: 09/11/22 11:18 AM   Specimen: BLOOD  Result Value Ref Range Status   Specimen Description BLOOD BLOOD RIGHT HAND  Final   Special Requests   Final    BOTTLES DRAWN AEROBIC AND ANAEROBIC Blood Culture adequate volume   Culture   Final    NO GROWTH 3 DAYS Performed at [facility], [place] [address], [place], [place] [address]    Report Status PENDING  Incomplete  Culture, blood (Routine X 2) w Reflex to ID Panel     Status: None (Preliminary result)   Collection Time: 09/11/22 11:18 AM   Specimen: BLOOD  Result Value Ref Range Status  Specimen Description BLOOD LEFT ANTECUBITAL  Final   Special Requests   Final    BOTTLES DRAWN AEROBIC AND ANAEROBIC Blood Culture adequate volume   Culture   Final    NO GROWTH 3 DAYS Performed at [facility], [address], [place], [place] [address]    Report Status PENDING  Incomplete      Radiology Studies: No results found.  Scheduled Meds:  sodium chloride   Intravenous Once   aspirin  81 mg Oral Daily   Chlorhexidine Gluconate Cloth  6 each Topical Daily   insulin aspart  0-5 Units Subcutaneous QHS   insulin aspart  0-9 Units Subcutaneous TID WC   melatonin  3 mg Oral QHS   metoprolol tartrate  12.5 mg Oral BID   rosuvastatin  40 mg Oral Daily   senna-docusate  1 tablet Oral Daily   sodium chloride flush  3 mL Intravenous Q12H   tamsulosin  0.4 mg Oral BID   Thrombi-Pad  1 each Topical Once    ticagrelor  90 mg Oral BID   Continuous Infusions:  sodium chloride 10 mL/hr at 09/11/22 0824   sodium chloride     ampicillin (OMNIPEN) IV 2 g (09/14/22 1000)     LOS: 7 days    Flora Lipps, MD [facility] If 7PM-7AM, please contact night-coverage [email or web address] 09/14/2022, 10:37 AM

## 2022-09-15 ENCOUNTER — Encounter: Payer: Self-pay | Admitting: Internal Medicine

## 2022-09-15 ENCOUNTER — Other Ambulatory Visit (HOSPITAL_COMMUNITY): Payer: Self-pay

## 2022-09-15 LAB — MAGNESIUM: Magnesium: 1.8 mg/dL (ref 1.7–2.4)

## 2022-09-15 LAB — BASIC METABOLIC PANEL
Anion gap: 7 (ref 5–15)
BUN: 15 mg/dL (ref 8–23)
CO2: 21 mmol/L — ABNORMAL LOW (ref 22–32)
Calcium: 8.5 mg/dL — ABNORMAL LOW (ref 8.9–10.3)
Chloride: 105 mmol/L (ref 98–111)
Creatinine, Ser: 0.91 mg/dL (ref 0.61–1.24)
GFR, Estimated: >60 mL/min (ref >60)
Glucose, Bld: 106 mg/dL — ABNORMAL HIGH (ref 70–99)
Potassium: 3.8 mmol/L (ref 3.5–5.1)
Sodium: 133 mmol/L — ABNORMAL LOW (ref 135–145)

## 2022-09-15 LAB — CULTURE, BLOOD (ROUTINE X 2)
Culture: NO GROWTH
Culture: NO GROWTH
Special Requests: ADEQUATE
Special Requests: ADEQUATE

## 2022-09-15 LAB — GLUCOSE, CAPILLARY
Glucose-Capillary: 129 mg/dL — ABNORMAL HIGH (ref 70–99)
Glucose-Capillary: 87 mg/dL (ref 70–99)
Glucose-Capillary: 101 mg/dL — ABNORMAL HIGH (ref 70–99)

## 2022-09-15 MED ORDER — AMOXICILLIN 500 MG PO CAPS
1000.0000 mg | ORAL_CAPSULE | Freq: Three times a day (TID) | ORAL | 0 refills | Status: AC
  Filled 2022-09-15: qty 54, 9d supply, fill #0

## 2022-09-15 MED ORDER — TICAGRELOR 90 MG PO TABS
90.0000 mg | ORAL_TABLET | Freq: Two times a day (BID) | ORAL | 11 refills | Status: DC
  Filled 2022-09-15 – 2022-10-11 (×2): qty 60, 30d supply, fill #0
  Filled 2022-11-11: qty 60, 30d supply, fill #1

## 2022-09-15 MED ORDER — ASPIRIN 81 MG PO CHEW
81.0000 mg | CHEWABLE_TABLET | Freq: Every day | ORAL | 11 refills | Status: DC
  Filled 2022-09-15 – 2022-10-11 (×2): qty 30, 30d supply, fill #0

## 2022-09-15 MED ORDER — METOPROLOL TARTRATE 25 MG PO TABS
12.5000 mg | ORAL_TABLET | Freq: Two times a day (BID) | ORAL | 11 refills | Status: DC
  Filled 2022-09-15 – 2022-10-11 (×2): qty 30, 30d supply, fill #0
  Filled 2022-11-11: qty 30, 30d supply, fill #1

## 2022-09-15 NOTE — Progress Notes (Signed)
CARDIAC REHAB PHASE I   PRE:  Rate/Rhythm: 81 NSR  BP:  Sitting: 115/93      SaO2: 94 RA  MODE:  Ambulation: 560 ft   AD:  RW  POST:  Rate/Rhythm: 106 ST  BP:  Sitting: 113/81      SaO2: 95 RA  Pt amb with contact and standby assistance, pt denies CP and SOB during amb and was returned to room w/o complaint.   Pt strength has significantly improved since admission  Pt now in chair with water.   Larry Kent  10:19 AM 09/15/2022    Service time is from 1000 to 1022.

## 2022-09-15 NOTE — Progress Notes (Addendum)
Rounding Note    Patient Name: Larry Kent Date of Encounter: 09/15/2022  [place] Cardiologist: Kathlyn Sacramento, MD   Subjective   No complaints and has good understanding his medical history and plans for care. Denies any SOB, chest pain, swelling.   Inpatient Medications    Scheduled Meds:  aspirin  81 mg Oral Daily   insulin aspart  0-5 Units Subcutaneous QHS   insulin aspart  0-9 Units Subcutaneous TID WC   melatonin  3 mg Oral QHS   metoprolol tartrate  12.5 mg Oral BID   rosuvastatin  40 mg Oral Daily   senna-docusate  1 tablet Oral Daily   sodium chloride flush  3 mL Intravenous Q12H   tamsulosin  0.4 mg Oral BID   Thrombi-Pad  1 each Topical Once   ticagrelor  90 mg Oral BID   Continuous Infusions:  sodium chloride 10 mL/hr at 09/11/22 0824   ampicillin (OMNIPEN) IV 2 g (09/15/22 0904)   PRN Meds: acetaminophen, fentaNYL (SUBLIMAZE) injection, ondansetron (ZOFRAN) IV, mouth rinse   Vital Signs    Vitals:   09/14/22 0949 09/14/22 1614 09/14/22 2001 09/15/22 0635  BP: (!) 116/92 105/76 (!) 112/96 109/74  Pulse: 81 77 68 74  Resp:  16 17 18   Temp:   98.1 F (36.7 C) 98.2 F (36.8 C)  TempSrc:   Oral Oral  SpO2:   95% 94%  Weight:      Height:        Intake/Output Summary (Last 24 hours) at 09/15/2022 1020 Last data filed at 09/15/2022 [id number] Gross per 24 hour  Intake 350.67 ml  Output 3050 ml  Net -2699.33 ml      09/11/2022    6:00 AM 09/09/2022    6:00 AM 09/08/2022    6:05 AM  Last 3 Weights  Weight (lbs) 169 lb 1.5 oz 163 lb 9.3 oz 164 lb 14.5 oz  Weight (kg) 76.7 kg 74.2 kg 74.8 kg      Telemetry    NSR - Personally Reviewed  ECG    None new - Personally Reviewed  Physical Exam  GEN: No acute distress.   Neck: No JVD Cardiac: RRR, no murmurs, rubs, or gallops.  Respiratory: Clear to auscultation bilaterally. GI: Soft, nontender, non-distended  MS: No edema; No deformity. Diffuse ecchymosis R forearm extending to  elbow. Neuro:  Nonfocal  Psych: Normal affect   Labs    High Sensitivity Troponin:   Recent Labs  Lab 09/07/22 1225 09/07/22 1639  TROPONINIHS 3,532* 7,318*     Chemistry Recent Labs  Lab 09/11/22 1420 09/11/22 1737 09/12/22 0121 09/13/22 0227 09/14/22 0235 09/15/22 0200  NA  --    < > 131*  131* 133* 134* 133*  K  --    < > 4.2  4.1 3.9 3.6 3.8  CL  --    < > 102  103 106 107 105  CO2  --    < > 19*  19* 21* 21* 21*  GLUCOSE  --    < > 104*  109* 132* 124* 106*  BUN  --    < > 17  16 16 17 15   CREATININE  --    < > 0.86  0.83 0.98 0.99 0.91  CALCIUM  --    < > 7.9*  7.9* 8.3* 8.1* 8.5*  MG 1.7  --   --   --   --  1.8  ALBUMIN  --   --  2.2*  --   --   --   GFRNONAA  --    < > >60  >60 >60 >60 >60  ANIONGAP  --    < > 10  9 6 6 7    < > = values in this interval not displayed.    Lipids No results for input(s): "CHOL", "TRIG", "HDL", "LABVLDL", "LDLCALC", "CHOLHDL" in the last 168 hours.  Hematology Recent Labs  Lab 09/12/22 0121 09/13/22 0227 09/14/22 0235  WBC 3.2* 4.9 6.5  RBC 2.31* 2.53* 2.63*  HGB 8.0* 8.3* 8.8*  HCT 22.0* 24.8* 25.6*  MCV 95.2 98.0 97.3  MCH 34.6* 32.8 33.5  MCHC 36.4* 33.5 34.4  RDW 15.6* 15.6* 15.2  PLT 78* 95* 107*   Thyroid  Recent Labs  Lab 09/11/22 1420  TSH 2.144    BNPNo results for input(s): "BNP", "PROBNP" in the last 168 hours.  DDimer No results for input(s): "DDIMER" in the last 168 hours.   Radiology    No results found.  Cardiac Studies   09/07/2021 ECHOCARDIOGRAM   IMPRESSIONS   1. Left ventricular ejection fraction, by estimation, is 65 to 70%. The left ventricle has normal function. The left ventricle demonstrates regional wall motion abnormalities:The mid and distal anterior septum and mid inferoseptal segment are  hypokinetic. There is moderate concentric left ventricular hypertrophy. Left ventricular diastolic parameters are indeterminate.   2. Right ventricular systolic function is normal.  The right ventricular size is moderately enlarged. There is normal pulmonary artery systolic pressure.   3. Right atrial size was mildly dilated.   4. The mitral valve is degenerative. No evidence of mitral valve regurgitation. No evidence of mitral stenosis. The mean mitral valve gradient is 3.0 mmHg with average heart rate of 66 bpm. Severe mitral annular calcification.   5. Mixed valve disease. Central aortic regurgitation, normal LV stroke volume index. The aortic valve is calcified. Aortic valve regurgitation is mild to moderate. Mild aortic valve stenosis. Aortic valve mean gradient measures 14.0 mmHg.   6. The inferior vena cava is normal in size with greater than 50% respiratory variability, suggesting right atrial pressure of 3 mmHg.    Cardiac cath-PCI RCA 09/07/2022: Diagnostic Dominance: Right                                                           Intervention            Ost RCA lesion is 80% stenosed.   Prox RCA lesion is 70% stenosed.   Mid LM to Dist LM lesion is 50% stenosed.   Dist LM to Ost LAD lesion is 50% stenosed.   Ost Cx lesion is 40% stenosed.   Prox Cx lesion is 60% stenosed.   Mid LAD lesion is 60% stenosed.   A stent was successfully placed.   Post intervention, there is a 0% residual stenosis.   Post intervention, there is a 0% residual stenosis.   1.  Thrombotic occlusion of right coronary artery treated with 4 overlapping drug-eluting stents. 2.  Distal left main, ostial LAD, and ostial left circumflex disease. 3.  LVEDP of 11 mmHg.   09/23/2021 VAS US carotid duplex Summary:  Right Carotid: There is no evidence of stenosis in the right ICA.  Left Carotid: Velocities in the left ICA are  consistent with a 1-39% stenosis.  Vertebrals: Right vertebral artery demonstrates antegrade flow. Left  vertebral artery demonstrates high resistant flow.  Subclavians: Normal flow hemodynamics were seen in bilateral subclavian arteries.    10/22/2020 ECHO  complete LVEF 55-60%. Left ventricle with normal function. Moderate left ventricular hypertrophy. Grade II diastolic dysfunction. Elevated L atrial pressure R ventricle with normal function with mild enlargement.  Mitral valve abnormal with no evidence of MV regurgitation or evidence of mitral stenosis Mild calcification of the aortic valve. Mild thickening. Trivial AV regurgitation Patient Profile     77 y.o. male with a PHX of metastatic prostate cancer, lung nodules concerning for neuroendocrine carcinoma on biopsy undergoing palliative chemo/immunotherapy, carotid disease s/p carotid endarterectomy in 2021 for severe, asymptomatic stenosis, HLD presenting from oncologist office to [facility] ED for chest pain evaluation, found to have an inferior MI on EKG, subsequently transferred to [facility] on 09/07/2022. Underwent cardiac catheterization with stent placement x4 in the RCA, with febrile illness of post PCI day 2, now with enterococcus faecalis bacteremia.  Assessment & Plan    Inferior STEMI  HLD Admitted 09/07/2022 and underwent cardiac catheterization with DES placement x 4 to RCA. Moderate stenosis of Distal LM, ostial left circumflex, distal and ostial LAD, but not stented.  There is known disease of the LAD, but we do not have any plans of recatherization unless he is presenting with anginal symptoms  Echocardiogram showing EF 65 to 75% with mild hypokinesis in the mid inferior septal segment as well as the mid and distal anterior septal segments.  Plan is to continue to maximize medical management with aspirin and Brilinta for at least 12 months given 4 stent placements and high risk for occlusion. Continue aspirin and Brilinta for at least 12 months Continue rosuvastatin 40 mg daily Continue metoprolol tartrate 12.5 mg twice daily with conversion to Toprol at discharge Not complaining of dyspnea or bradycardia on brillinta  Has moderate signs of bruising on right forearm from cath site.  Denies any  urine in stool or urine. Otherwise no signs of infection at cath sites. Has been ambulating well with cardiac rehab  Enterococcus faecalis bacteremia E faecalis UTI Per infectious disease no need for TEE.   For questions or updates, please contact [place] Please consult [email or web address] for contact info under        Signed, Bonnee Quin, PA-C  09/15/2022, 10:20 AM     I have examined the patient and reviewed assessment and plan and discussed with patient.  Agree with above as stated.    Doing well from cardiac standpoint.  Biggest concern is trying to urinate. Continue DAPT.   Larae Grooms

## 2022-09-15 NOTE — Progress Notes (Signed)
Subjective:  No new complaints   Antibiotics:  Anti-infectives (From admission, onward)    Start     Dose/Rate Route Frequency Ordered Stop   09/10/22 0100  ampicillin (OMNIPEN) 2 g in sodium chloride 0.9 % 100 mL IVPB        2 g 300 mL/hr over 20 Minutes Intravenous Every 4 hours 09/10/22 0007     09/09/22 1200  cefTRIAXone (ROCEPHIN) 2 g in sodium chloride 0.9 % 100 mL IVPB  Status:  Discontinued        2 g 200 mL/hr over 30 Minutes Intravenous Every 24 hours 09/09/22 1101 09/10/22 0007       Medications: Scheduled Meds:  sodium chloride   Intravenous Once   aspirin  81 mg Oral Daily   Chlorhexidine Gluconate Cloth  6 each Topical Daily   insulin aspart  0-5 Units Subcutaneous QHS   insulin aspart  0-9 Units Subcutaneous TID WC   melatonin  3 mg Oral QHS   metoprolol tartrate  12.5 mg Oral BID   rosuvastatin  40 mg Oral Daily   senna-docusate  1 tablet Oral Daily   sodium chloride flush  3 mL Intravenous Q12H   tamsulosin  0.4 mg Oral BID   Thrombi-Pad  1 each Topical Once   ticagrelor  90 mg Oral BID   Continuous Infusions:  sodium chloride 10 mL/hr at 09/11/22 0824   sodium chloride     ampicillin (OMNIPEN) IV 2 g (09/15/22 0904)   PRN Meds:.sodium chloride, acetaminophen, fentaNYL (SUBLIMAZE) injection, ondansetron (ZOFRAN) IV, mouth rinse, sodium chloride flush    Objective: Weight change:   Intake/Output Summary (Last 24 hours) at 09/15/2022 0949 Last data filed at 09/15/2022 [id number] Gross per 24 hour  Intake 850.67 ml  Output 3050 ml  Net -2199.33 ml    Blood pressure 109/74, pulse 74, temperature 98.2 F (36.8 C), temperature source Oral, resp. rate 18, height 5\' 9"  (1.753 m), weight 76.7 kg, SpO2 94 %. Temp:  [98.1 F (36.7 C)-98.2 F (36.8 C)] 98.2 F (36.8 C) (03/19 0635) Pulse Rate:  [68-77] 74 (03/19 0635) Resp:  [16-18] 18 (03/19 0635) BP: (105-112)/(74-96) 109/74 (03/19 0635) SpO2:  [94 %-95 %] 94 % (03/19 [id number])  Physical  Exam: Physical Exam Constitutional:      Appearance: He is well-developed.  HENT:     Head: Normocephalic and atraumatic.  Eyes:     Conjunctiva/sclera: Conjunctivae normal.  Cardiovascular:     Rate and Rhythm: Normal rate and regular rhythm.  Pulmonary:     Effort: Pulmonary effort is normal. No respiratory distress.     Breath sounds: No wheezing.  Abdominal:     General: There is no distension.     Palpations: Abdomen is soft.  Musculoskeletal:        General: Normal range of motion.     Cervical back: Normal range of motion and neck supple.  Skin:    General: Skin is warm and dry.     Findings: No erythema or rash.  Neurological:     General: No focal deficit present.     Mental Status: He is alert and oriented to person, place, and time.  Psychiatric:        Mood and Affect: Mood normal.        Behavior: Behavior normal.        Thought Content: Thought content normal.        Judgment: Judgment normal.  CBC:    BMET Recent Labs    09/14/22 0235 09/15/22 0200  NA 134* 133*  K 3.6 3.8  CL 107 105  CO2 21* 21*  GLUCOSE 124* 106*  BUN 17 15  CREATININE 0.99 0.91  CALCIUM 8.1* 8.5*      Liver Panel  No results for input(s): "PROT", "ALBUMIN", "AST", "ALT", "ALKPHOS", "BILITOT", "BILIDIR", "IBILI" in the last 72 hours.      Sedimentation Rate No results for input(s): "ESRSEDRATE" in the last 72 hours. C-Reactive Protein No results for input(s): "CRP" in the last 72 hours.  Micro Results: Recent Results (from the past 720 hour(s))  Urine Culture (for pregnant, neutropenic or urologic patients or patients with an indwelling urinary catheter)     Status: None   Collection Time: 08/18/22  4:41 PM   Specimen: Urine, Catheterized  Result Value Ref Range Status   Specimen Description   Final    URINE, CATHETERIZED Performed at [facility] [address], [place], [place] [address]    Special Requests   Final     Immunocompromised Performed at [facility], [place] [address] [place], [place] [address]    Culture   Final    NO GROWTH Performed at [facility], [place] [address], [place], [place] [address]    Report Status 08/19/2022 FINAL  Final  Culture, blood (routine x 2)     Status: None   Collection Time: 08/18/22  6:45 PM   Specimen: BLOOD  Result Value Ref Range Status   Specimen Description   Final    BLOOD RIGHT ANTECUBITAL Performed at [facility] [address], [place], [place] [address]    Special Requests   Final    BOTTLES DRAWN AEROBIC AND ANAEROBIC Blood Culture adequate volume Performed at [facility] [address] [place], [place] [address]    Culture   Final    NO GROWTH 5 DAYS Performed at [facility], [place] [address], [place], [place] [address]    Report Status 08/23/2022 FINAL  Final  Culture, blood (routine x 2)     Status: None   Collection Time: 08/18/22  6:55 PM   Specimen: BLOOD  Result Value Ref Range Status   Specimen Description   Final    BLOOD LEFT ANTECUBITAL Performed at [facility] [address] [place], [place] [address]    Special Requests   Final    BOTTLES DRAWN AEROBIC AND ANAEROBIC Blood Culture results may not be optimal due to an excessive volume of blood received in culture bottles Performed at [facility] [address], [place], [place] [address]    Culture   Final    NO GROWTH 5 DAYS Performed at [facility], [place] [address], [place], [place] [address]    Report Status 08/23/2022 FINAL  Final  Blood Culture (routine x 2)     Status: None   Collection Time: 08/21/22 11:40 PM   Specimen: BLOOD  Result Value Ref Range Status   Specimen Description   Final    BLOOD BLOOD LEFT FOREARM Performed at [facility] [address] [place], [place] [address]    Special Requests   Final    BOTTLES DRAWN  AEROBIC AND ANAEROBIC Blood Culture adequate volume Performed at [facility] [address], [place],  [address]    Culture   Final  NO GROWTH 5 DAYS Performed at [facility], [place] [address] [place], [place] [address]    Report Status 08/27/2022 FINAL  Final  Blood Culture (routine x 2)     Status: None   Collection Time: 08/21/22 11:51 PM   Specimen: BLOOD  Result Value Ref Range Status   Specimen Description   Final    BLOOD BLOOD LEFT FOREARM Performed at [facility] [address] [place], [place] [address]    Special Requests   Final    BOTTLES DRAWN AEROBIC AND ANAEROBIC Blood Culture adequate volume Performed at [facility] [address], [place], [place] [address]    Culture   Final    NO GROWTH 5 DAYS Performed at [facility], [place] [address], [place], [place] [address]    Report Status 08/27/2022 FINAL  Final  Resp panel by RT-PCR (RSV, Flu A&B, Covid) Anterior Nasal Swab     Status: None   Collection Time: 08/22/22  1:11 AM   Specimen: Anterior Nasal Swab  Result Value Ref Range Status   SARS Coronavirus 2 by RT PCR NEGATIVE NEGATIVE Final    Comment: (NOTE) SARS-CoV-2 target nucleic acids are NOT DETECTED.  The SARS-CoV-2 RNA is generally detectable in upper respiratory specimens during the acute phase of infection. The lowest concentration of SARS-CoV-2 viral copies this assay can detect is 138 copies/mL. A negative result does not preclude SARS-Cov-2 infection and should not be used as the sole basis for treatment or other patient management decisions. A negative result may occur with  improper specimen collection/handling, submission of specimen other than nasopharyngeal swab, presence of viral mutation(s) within the areas targeted by this assay, and inadequate number of viral copies(<138 copies/mL). A negative result must be combined with clinical observations, patient  history, and epidemiological information. The expected result is Negative.  Fact Sheet for Patients:  EntrepreneurPulse.com.au  Fact Sheet for Healthcare Providers:  IncredibleEmployment.be  This test is no t yet approved or cleared by the [place] FDA and  has been authorized for detection and/or diagnosis of SARS-CoV-2 by FDA under an Emergency Use Authorization (EUA). This EUA will remain  in effect (meaning this test can be used) for the duration of the COVID-19 declaration under Section 564(b)(1) of the Act, 21 U.S.C.section 360bbb-3(b)(1), unless the authorization is terminated  or revoked sooner.       Influenza A by PCR NEGATIVE NEGATIVE Final   Influenza B by PCR NEGATIVE NEGATIVE Final    Comment: (NOTE) The Xpert Xpress SARS-CoV-2/FLU/RSV plus assay is intended as an aid in the diagnosis of influenza from Nasopharyngeal swab specimens and should not be used as a sole basis for treatment. Nasal washings and aspirates are unacceptable for Xpert Xpress SARS-CoV-2/FLU/RSV testing.  Fact Sheet for Patients: EntrepreneurPulse.com.au  Fact Sheet for Healthcare Providers: IncredibleEmployment.be  This test is not yet approved or cleared by the [place] FDA and has been authorized for detection and/or diagnosis of SARS-CoV-2 by FDA under an Emergency Use Authorization (EUA). This EUA will remain in effect (meaning this test can be used) for the duration of the COVID-19 declaration under Section 564(b)(1) of the Act, 21 U.S.C. section 360bbb-3(b)(1), unless the authorization is terminated or revoked.     Resp Syncytial Virus by PCR NEGATIVE NEGATIVE Final    Comment: (NOTE) Fact Sheet for Patients: EntrepreneurPulse.com.au  Fact Sheet for Healthcare Providers: IncredibleEmployment.be  This test is not yet approved or cleared by the [place]  States FDA  and has been authorized for detection and/or diagnosis of SARS-CoV-2 by FDA under an Emergency Use Authorization (EUA). This EUA will remain in effect (meaning this test can be used) for the duration of the COVID-19 declaration under Section 564(b)(1) of the Act, 21 U.S.C. section 360bbb-3(b)(1), unless the authorization is terminated or revoked.  Performed at [facility], [place] [address] [place], [place] [address]   Urine Culture (for pregnant, neutropenic or urologic patients or patients with an indwelling urinary catheter)     Status: None   Collection Time: 08/22/22  1:16 AM   Specimen: Urine, Clean Catch  Result Value Ref Range Status   Specimen Description   Final    URINE, CLEAN CATCH Performed at [facility], [place] [address] [place], [place] [address]    Special Requests   Final    NONE Performed at [facility], [place] [address] [place], [place] [address]    Culture   Final    NO GROWTH Performed at [facility], [place] [address], [place], [place] [address]    Report Status 08/23/2022 FINAL  Final  Urine Culture (for pregnant, neutropenic or urologic patients or patients with an indwelling urinary catheter)     Status: Abnormal   Collection Time: 09/09/22  9:10 AM   Specimen: Urine, Catheterized  Result Value Ref Range Status   Specimen Description URINE, CATHETERIZED  Final   Special Requests   Final    Immunocompromised Performed at [facility], [place] [address], [place], [place] [address]    Culture >=100,000 COLONIES/mL ENTEROCOCCUS FAECALIS (A)  Final   Report Status 09/11/2022 FINAL  Final   Organism ID, Bacteria ENTEROCOCCUS FAECALIS (A)  Final      Susceptibility   Enterococcus faecalis - MIC*    AMPICILLIN <=2 SENSITIVE Sensitive     NITROFURANTOIN <=16 SENSITIVE Sensitive     VANCOMYCIN 1 SENSITIVE Sensitive     LINEZOLID 2 SENSITIVE Sensitive     * >=100,000 COLONIES/mL ENTEROCOCCUS  FAECALIS  Culture, blood (Routine X 2) w Reflex to ID Panel     Status: Abnormal   Collection Time: 09/09/22  9:32 AM   Specimen: BLOOD RIGHT ARM  Result Value Ref Range Status   Specimen Description BLOOD RIGHT ARM  Final   Special Requests   Final    BOTTLES DRAWN AEROBIC AND ANAEROBIC Blood Culture adequate volume   Culture  Setup Time   Final    GRAM POSITIVE COCCI IN CHAINS IN BOTH AEROBIC AND ANAEROBIC BOTTLES Organism ID to follow CRITICAL RESULT CALLED TO, READ BACK BY AND VERIFIED WITH: T RUDISILL,[email or web address]  09/09/22 [place] Performed at [facility], [place] [address] [place], [place] [address]    Culture ENTEROCOCCUS FAECALIS (A)  Final   Report Status 09/11/2022 FINAL  Final   Organism ID, Bacteria ENTEROCOCCUS FAECALIS  Final      Susceptibility   Enterococcus faecalis - MIC*    AMPICILLIN <=2 SENSITIVE Sensitive     VANCOMYCIN 1 SENSITIVE Sensitive     GENTAMICIN SYNERGY SENSITIVE Sensitive     LINEZOLID 2 SENSITIVE Sensitive     * ENTEROCOCCUS FAECALIS  Blood Culture ID Panel (Reflexed)     Status: Abnormal   Collection Time: 09/09/22  9:32 AM  Result Value Ref Range Status   Enterococcus faecalis DETECTED (A) NOT DETECTED Final    Comment: CRITICAL RESULT CALLED TO, READ BACK BY AND VERIFIED WITH: T RUDISILL,[email or web address]   09/09/22 [place]    Enterococcus Faecium NOT DETECTED NOT DETECTED Final   Listeria monocytogenes NOT DETECTED NOT DETECTED Final   Staphylococcus species NOT DETECTED NOT DETECTED Final   Staphylococcus aureus (BCID) NOT DETECTED NOT DETECTED Final   Staphylococcus epidermidis NOT DETECTED NOT DETECTED Final   Staphylococcus lugdunensis NOT DETECTED NOT DETECTED Final   Streptococcus species NOT DETECTED NOT DETECTED Final   Streptococcus agalactiae NOT DETECTED NOT DETECTED Final   Streptococcus pneumoniae NOT DETECTED NOT DETECTED Final   Streptococcus pyogenes NOT DETECTED NOT DETECTED Final   A.calcoaceticus-baumannii NOT DETECTED NOT DETECTED  Final   Bacteroides fragilis NOT DETECTED NOT DETECTED Final   Enterobacterales NOT DETECTED NOT DETECTED Final   Enterobacter cloacae complex NOT DETECTED NOT DETECTED Final   Escherichia coli NOT DETECTED NOT DETECTED Final   Klebsiella aerogenes NOT DETECTED NOT DETECTED Final   Klebsiella oxytoca NOT DETECTED NOT DETECTED Final   Klebsiella pneumoniae NOT DETECTED NOT DETECTED Final   Proteus species NOT DETECTED NOT DETECTED Final   Salmonella species NOT DETECTED NOT DETECTED Final   Serratia marcescens NOT DETECTED NOT DETECTED Final   Haemophilus influenzae NOT DETECTED NOT DETECTED Final   Neisseria meningitidis NOT DETECTED NOT DETECTED Final   Pseudomonas aeruginosa NOT DETECTED NOT DETECTED Final   Stenotrophomonas maltophilia NOT DETECTED NOT DETECTED Final   Candida albicans NOT DETECTED NOT DETECTED Final   Candida auris NOT DETECTED NOT DETECTED Final   Candida glabrata NOT DETECTED NOT DETECTED Final   Candida krusei NOT DETECTED NOT DETECTED Final   Candida parapsilosis NOT DETECTED NOT DETECTED Final   Candida tropicalis NOT DETECTED NOT DETECTED Final   Cryptococcus neoformans/gattii NOT DETECTED NOT DETECTED Final   Vancomycin resistance NOT DETECTED NOT DETECTED Final    Comment: Performed at [facility], [address], [place], [place] [address]  Culture, blood (Routine X 2) w Reflex to ID Panel     Status: Abnormal   Collection Time: 09/09/22  9:34 AM   Specimen: BLOOD RIGHT ARM  Result Value Ref Range Status   Specimen Description BLOOD RIGHT ARM  Final   Special Requests   Final    BOTTLES DRAWN AEROBIC AND ANAEROBIC Blood Culture adequate volume   Culture  Setup Time   Final    GRAM POSITIVE COCCI IN BOTH AEROBIC AND ANAEROBIC BOTTLES CRITICAL VALUE NOTED.  VALUE IS CONSISTENT WITH PREVIOUSLY REPORTED AND CALLED VALUE.    Culture (A)  Final    ENTEROCOCCUS FAECALIS SUSCEPTIBILITIES PERFORMED ON PREVIOUS CULTURE WITHIN THE LAST 5  DAYS. Performed at [facility], [place] [address], [place], [place] [address]    Report Status 09/11/2022 FINAL  Final  Culture, blood (Routine X 2) w Reflex to ID Panel     Status: None   Collection Time: 09/10/22  8:15 AM   Specimen: BLOOD  Result Value Ref Range Status   Specimen Description BLOOD RIGHT ANTECUBITAL  Final   Special Requests   Final    BOTTLES DRAWN AEROBIC AND ANAEROBIC Blood Culture adequate volume   Culture   Final    NO GROWTH 5 DAYS Performed at [facility], [place] [address] [place], [place] [address]    Report Status 09/15/2022 FINAL  Final  Culture, blood (Routine X 2) w Reflex to ID Panel     Status: None   Collection Time: 09/10/22  8:19 AM   Specimen: BLOOD  Result Value Ref Range Status   Specimen Description BLOOD  RIGHT ANTECUBITAL  Final   Special Requests   Final    BOTTLES DRAWN AEROBIC AND ANAEROBIC Blood Culture adequate volume   Culture   Final    NO GROWTH 5 DAYS Performed at [facility], [address] [place], [place] [address]    Report Status 09/15/2022 FINAL  Final  Culture, blood (Routine X 2) w Reflex to ID Panel     Status: None (Preliminary result)   Collection Time: 09/11/22 11:18 AM   Specimen: BLOOD  Result Value Ref Range Status   Specimen Description BLOOD BLOOD RIGHT HAND  Final   Special Requests   Final    BOTTLES DRAWN AEROBIC AND ANAEROBIC Blood Culture adequate volume   Culture   Final    NO GROWTH 4 DAYS Performed at [facility], [place] [address] [place], [place] [address]    Report Status PENDING  Incomplete  Culture, blood (Routine X 2) w Reflex to ID Panel     Status: None (Preliminary result)   Collection Time: 09/11/22 11:18 AM   Specimen: BLOOD  Result Value Ref Range Status   Specimen Description BLOOD LEFT ANTECUBITAL  Final   Special Requests   Final    BOTTLES DRAWN AEROBIC AND ANAEROBIC Blood Culture adequate volume   Culture   Final    NO GROWTH 4 DAYS Performed at [facility], [place] [address] [place], [place] [address]    Report Status PENDING  Incomplete    Studies/Results: No results found.    Assessment/Plan:  INTERVAL HISTORY:   Catheter out and he is having voiding trial  Principal Problem:   Enterococcal bacteremia Active Problems:   Hyperlipidemia with target LDL less than 70   Prediabetes   Cerebrovascular disease   Indwelling urinary catheter present   Acute ST elevation myocardial infarction (STEMI) of inferior wall (HCC)   Coronary artery disease involving native coronary artery of native heart with unstable angina pectoris ([place])   Left main coronary artery disease   Hematuria   Fever   S/P coronary artery stent placement   Port or reservoir infection   Catheter-associated urinary tract infection ([place])    Larry Kent is a 77 y.o. 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 Source is suspected urinary with Enterococcu s faecalis growing 100,000 colony-forming units from urine and also in his blood.  He has now had his port removed which will help [place] achieve cure.   He is having voiding trial right now  Once his catheter situation is sorted out he should be ready to DC it seems  At that point we can switch him to high dose amoxicillin which Jimmy Footman is ordering via TOC  I will see back in clinic and check surveillance blood cultures.  I spent 52 minutes with the patient including than 50% of the time in face to face counseling of the patient already has enterococcal bacteremia port that was in place along with review of medical records in preparation for the visit and during the visit and in coordination of his care.    NAME SEESE has an appointment on  10/19/2022 at 330PM with Dr. Tommy Medal at  [facility],  which  is located in the [facility] at  [place] in [place].  [address], which is located to the left of the elevators.  Phone: [phone]  Fax: [phone]  [email or web address]  The patient should arrive 30 minutes prior to their appoitment.  I will sign off for now.  Please call with further questions.   LOS: 8 days   Alcide Evener 09/15/2022, 9:49 AM

## 2022-09-15 NOTE — Progress Notes (Signed)
Discharge instructions reviewed with pt.  Copy of instructions given to pt. Questions answered. Scripts being filled by [facility] and will be delivered to pt's room or picked up at the pharmacy on the pt's way out. Pt's wife is on her way to pick him up and is bringing his clothes to wear home (wife took other clothes home that he came in with pt states).     Pt asked about a dressing that is on his back, area has a scratch mark on it. Foam dressing removed, area appears like a scratch mark, red, mostly skin intact, has one small area to inner mid back on scratch mark that has a slight break in the skin, slightly moist.  No drainage, new small foam dressing applied to open skin area.  Pt instructed to keep the foam dressing on a few days, then remove and leave open to air.   Pt to call front desk when wife arrives with his clothes and he is dressed.

## 2022-09-15 NOTE — Discharge Summary (Signed)
Physician Discharge Summary  Larry Kent [id number] DOB: 1946/05/01 DOA: 09/07/2022  PCP: Ria Bush, MD  Admit date: 09/07/2022 Discharge date: 09/16/2022  Admitted From: Home  Discharge disposition: Home.    Recommendations for Outpatient Follow-Up:   Follow up with your primary care provider in one week.  Check CBC, BMP, magnesium in the next visit Follow-up with infectious disease as scheduled. Cardiology has recommended follow-up in 1 month for stress PET scan.  Discharge Diagnosis:   Principal Problem:   Enterococcal bacteremia Active Problems:   Hyperlipidemia with target LDL less than 70   Prediabetes   Cerebrovascular disease   Indwelling urinary catheter present   Acute ST elevation myocardial infarction (STEMI) of inferior wall (HCC)   Coronary artery disease involving native coronary artery of native heart with unstable angina pectoris (HCC)   Left main coronary artery disease   Hematuria   Fever   S/P coronary artery stent placement   Port or reservoir infection   Catheter-associated urinary tract infection ([place])   Discharge Condition: Improved.  Diet recommendation: Low sodium, heart healthy.    Wound care: None.  Code status: Full.   History of Present Illness:   Patient is a 77 year old male with metastatic prostate cancer on palliative chemotherapy who initially presented with acute inferior wall STEMI s/p cardiac cath and stent placement. Course was complicated with UTI/sepsis due to Enterococcus faecalis bacteremia.  Patient was seen by infectious disease and patient is currently on IV ampicillin.   Patient has had problems with urinary retention and, had Foley catheter. [facility] assumed primary attending from 09/11/2022.  Oncology, urology, IR cardiology, infectious disease were consulted during hospitalization.  Hospital Course:   Following conditions were addressed during hospitalization as listed below,  Inferior STEMI: Status  post LHC with distal occlusion of RCA with 4x DES.  2D echocardiogram on 09/08/2022 with LV ejection fraction of 65-75%, mild hypokinesis the mid inferiorseptal segment as well as the mid and distal anterior septal segments; suspect anterior portion hypokinesis in setting of nonstented mid LAD stenosis.  Cardiology on board and no plans for repeat left heart cath.  Continue aspirin and Brilinta for 12 months and continue metoprolol.  Will need to change to Toprol on discharge.  Follow-up recommended with Dr. Ellyn Hack with a stress PET scan in 1 month to evaluate for left coronary disease.   Enterococcus bacteremia: Likely urinary source.-S/p port removal on 3/14 by IR.  ID has recommended oral amoxicillin on discharge at 1 g 3 times a day for the next 9 days to complete the course..  Follow-up plan with ID as outpatient.  No recommendation for TEE   Metastatic neuroendocrine carcinoma Advanced prostate cancer: Status post chemo a week back..  Will follow-up with oncology as outpatient.  Acquired pancytopenia: Likely secondary to recent chemotherapy.  WBC at 6.5 hemoglobin 8.8 and platelet 107 at this time.   Urinary retention: -Status post Foley placement.  Continue Flomax.  Urology recommended voiding trial in-house and at this time patient is able to urinate by himself.     Type 2 diabetes mellitus: Latest hemoglobin A1c 6.8%.  Continue sliding scale insulin   Hyperlipidemia: Continue statin  Disposition.  At this time, patient is stable for disposition home with outpatient PCP, cardiology and infectious disease/oncology follow-up.  Medical Consultants:   Cardiology  IR ID Urology Oncology  Procedures:    Port removal  Subjective:   Today, patient was seen and examined at bedside.  Feels better.  Denies any  fever, chills or rigor.  Was able to urinate after removing the Foley catheter.  Discharge Exam:   Vitals:   09/15/22 0959 09/15/22 1237  BP: (!) 115/93 120/73  Pulse:  67   Resp: 18 17  Temp: 98.4 F (36.9 C) 98 F (36.7 C)  SpO2: 98%    Vitals:   09/14/22 2001 09/15/22 0635 09/15/22 0959 09/15/22 1237  BP: (!) 112/96 109/74 (!) 115/93 120/73  Pulse: 68 74  67  Resp: 17 18 18 17   Temp: 98.1 F (36.7 C) 98.2 F (36.8 C) 98.4 F (36.9 C) 98 F (36.7 C)  TempSrc: Oral Oral Oral Oral  SpO2: 95% 94% 98%   Weight:      Height:       General: Alert awake, not in obvious distress, elderly male, HENT: pupils equally reacting to light,  No scleral pallor or icterus noted. Oral mucosa is moist.  Chest:  Clear breath sounds.  Diminished breath sounds bilaterally. No crackles or wheezes.  CVS: S1 &S2 heard. No murmur.  Regular rate and rhythm. Abdomen: Soft, nontender, nondistended.  Bowel sounds are heard.   Extremities: No cyanosis, clubbing or edema.  Peripheral pulses are palpable. Psych: Alert, awake and oriented, normal mood CNS:  No cranial nerve deficits.  Power equal in all extremities.   Skin: Warm and dry.  No rashes noted.  The results of significant diagnostics from this hospitalization (including imaging, microbiology, ancillary and laboratory) are listed below for reference.     Diagnostic Studies:   ECHOCARDIOGRAM COMPLETE  Result Date: 09/08/2022    ECHOCARDIOGRAM REPORT   Patient Name:   Larry Kent Date of Exam: 09/08/2022 Medical Rec #:  [id number]       Height:       69.0 in Accession #:    [id number]      Weight:       164.9 lb Date of Birth:  1945-08-21       BSA:          1.903 m Patient Age:    37 years        BP:           110/65 mmHg Patient Gender: M               HR:           67 bpm. Exam Location:  Inpatient Procedure: 2D Echo, 3D Echo, Cardiac Doppler, Color Doppler and Intracardiac            Opacification Agent Indications:    Acute MI  History:        Patient has prior history of Echocardiogram examinations, most                 recent 10/22/2020. Carotid Disease, Signs/Symptoms:Murmur and                 Chest Pain; Risk  Factors:Dyslipidemia. Hx of cancer,                 chemotherapy.  Sonographer:    Eartha Inch Referring Phys: [id number] Early Osmond  Sonographer Comments: Image acquisition challenging due to patient body habitus and Image acquisition challenging due to respiratory motion. IMPRESSIONS  1. Left ventricular ejection fraction, by estimation, is 65 to 70%. The left ventricle has normal function. The left ventricle demonstrates regional wall motion abnormalities (see scoring diagram/findings for description). There is moderate concentric left ventricular hypertrophy. Left ventricular diastolic parameters are indeterminate.  2.  Right ventricular systolic function is normal. The right ventricular size is moderately enlarged. There is normal pulmonary artery systolic pressure.  3. Right atrial size was mildly dilated.  4. The mitral valve is degenerative. No evidence of mitral valve regurgitation. No evidence of mitral stenosis. The mean mitral valve gradient is 3.0 mmHg with average heart rate of 66 bpm. Severe mitral annular calcification.  5. Mixed valve disease. Central aortic regurgitation, normal LV stroke volume index. The aortic valve is calcified. Aortic valve regurgitation is mild to moderate. Mild aortic valve stenosis. Aortic valve mean gradient measures 14.0 mmHg.  6. The inferior vena cava is normal in size with greater than 50% respiratory variability, suggesting right atrial pressure of 3 mmHg. Comparison(s): Prior images reviewed side by side. Similar aortic findings. LV wall motion comparison is difficult- WMA seen best on contrast images, prior contrast images not performed. FINDINGS  Left Ventricle: Left ventricular ejection fraction, by estimation, is 65 to 70%. The left ventricle has normal function. The left ventricle demonstrates regional wall motion abnormalities. The left ventricular internal cavity size was normal in size. There is moderate concentric left ventricular hypertrophy. Left  ventricular diastolic parameters are indeterminate.  LV Wall Scoring: The mid and distal anterior septum and mid inferoseptal segment are hypokinetic. Right Ventricle: The right ventricular size is moderately enlarged. No increase in right ventricular wall thickness. Right ventricular systolic function is normal. There is normal pulmonary artery systolic pressure. The tricuspid regurgitant velocity is 2.00 m/s, and with an assumed right atrial pressure of 3 mmHg, the estimated right ventricular systolic pressure is [id number] mmHg. Left Atrium: Left atrial size was normal in size. Right Atrium: Right atrial size was mildly dilated. Pericardium: There is no evidence of pericardial effusion. Mitral Valve: The mitral valve is degenerative in appearance. Severe mitral annular calcification. No evidence of mitral valve regurgitation. No evidence of mitral valve stenosis. MV peak gradient, 9.5 mmHg. The mean mitral valve gradient is 3.0 mmHg with average heart rate of 66 bpm. Tricuspid Valve: The tricuspid valve is normal in structure. Tricuspid valve regurgitation is trivial. No evidence of tricuspid stenosis. Aortic Valve: Mixed valve disease. Central aortic regurgitation, normal LV stroke volume index. The aortic valve is calcified. Aortic valve regurgitation is mild to moderate. Aortic regurgitation PHT measures 651 msec. Mild aortic stenosis is present. Aortic valve mean gradient measures 14.0 mmHg. Aortic valve peak gradient measures 21.9 mmHg. Aortic valve area, by VTI measures 2.18 cm. Pulmonic Valve: The pulmonic valve was not well visualized. Pulmonic valve regurgitation is not visualized. Aorta: The aortic root and ascending aorta are structurally normal, with no evidence of dilitation. Venous: The inferior vena cava is normal in size with greater than 50% respiratory variability, suggesting right atrial pressure of 3 mmHg. IAS/Shunts: No atrial level shunt detected by color flow Doppler.  LEFT VENTRICLE PLAX 2D  LVIDd:         4.10 cm      Diastology LVIDs:         2.70 cm      LV e' medial:    5.11 cm/s LV PW:         1.30 cm      LV E/e' medial:  19.4 LV IVS:        1.15 cm      LV e' lateral:   7.40 cm/s LVOT diam:     2.10 cm      LV E/e' lateral: 13.4 LV SV:  108 LV SV Index:   57 LVOT Area:     3.46 cm  LV Volumes (MOD) LV vol d, MOD A2C: 97.7 ml LV vol d, MOD A4C: 134.0 ml LV vol s, MOD A2C: 26.3 ml LV vol s, MOD A4C: 48.9 ml LV SV MOD A2C:     71.4 ml LV SV MOD A4C:     134.0 ml LV SV MOD BP:      77.7 ml RIGHT VENTRICLE             IVC RV S prime:     13.70 cm/s  IVC diam: 1.80 cm TAPSE (M-mode): 2.0 cm LEFT ATRIUM             Index        RIGHT ATRIUM           Index LA diam:        3.90 cm 2.05 cm/m   RA Area:     21.00 cm LA Vol (A2C):   65.0 ml 34.15 ml/m  RA Volume:   66.00 ml  34.68 ml/m LA Vol (A4C):   53.2 ml 27.95 ml/m LA Biplane Vol: 61.3 ml 32.21 ml/m  AORTIC VALVE AV Area (Vmax):    2.06 cm AV Area (Vmean):   1.85 cm AV Area (VTI):     2.18 cm AV Vmax:           234.00 cm/s AV Vmean:          181.000 cm/s AV VTI:            0.495 m AV Peak Grad:      21.9 mmHg AV Mean Grad:      14.0 mmHg LVOT Vmax:         139.00 cm/s LVOT Vmean:        96.800 cm/s LVOT VTI:          0.312 m LVOT/AV VTI ratio: 0.63 AI PHT:            651 msec  AORTA Ao Root diam: 3.20 cm Ao Asc diam:  3.60 cm MITRAL VALVE                TRICUSPID VALVE MV Area (PHT): 2.76 cm     TR Peak grad:   16.0 mmHg MV Area VTI:   2.22 cm     TR Vmax:        200.00 cm/s MV Peak grad:  9.5 mmHg MV Mean grad:  3.0 mmHg     SHUNTS MV Vmax:       1.54 m/s     Systemic VTI:  0.31 m MV Vmean:      88.1 cm/s    Systemic Diam: 2.10 cm MV Decel Time: 275 msec MV E velocity: 99.00 cm/s MV A velocity: 127.00 cm/s MV E/A ratio:  0.78 Rudean Haskell MD Electronically signed by Rudean Haskell MD Signature Date/Time: 09/08/2022/1:09:21 PM    Final    CARDIAC CATHETERIZATION  Result Date: 09/07/2022   Ost RCA lesion is 80%  stenosed.   Prox RCA lesion is 70% stenosed.   Mid LM to Dist LM lesion is 50% stenosed.   Dist LM to Ost LAD lesion is 50% stenosed.   Ost Cx lesion is 40% stenosed.   Prox Cx lesion is 60% stenosed.   Mid LAD lesion is 60% stenosed.   A stent was successfully placed.   Post intervention, there is a 0% residual stenosis.   Post intervention,  there is a 0% residual stenosis. 1.  Thrombotic occlusion of right coronary artery treated with 4 overlapping drug-eluting stents. 2.  Distal left main, ostial LAD, and ostial left circumflex disease. 3.  LVEDP of 11 mmHg. Recommendation: The results were discussed with Dr. Ellyn Hack.  The patient will be admitted to the hospital, medical therapy will be pursued as well as an echocardiogram.  Will follow-up as an outpatient regarding treatment for residual disease.     Labs:   Basic Metabolic Panel: Recent Labs  Lab 09/11/22 1420 09/11/22 1737 09/12/22 0121 09/13/22 0227 09/14/22 0235 09/15/22 0200  NA  --  132* 131*  131* 133* 134* 133*  K  --  3.9 4.2  4.1 3.9 3.6 3.8  CL  --  104 102  103 106 107 105  CO2  --  19* 19*  19* 21* 21* 21*  GLUCOSE  --  114* 104*  109* 132* 124* 106*  BUN  --  16 17  16 16 17 15   CREATININE  --  0.87 0.86  0.83 0.98 0.99 0.91  CALCIUM  --  8.0* 7.9*  7.9* 8.3* 8.1* 8.5*  MG 1.7  --   --   --   --  1.8  PHOS  --   --  2.8  --   --   --    GFR Estimated Creatinine Clearance: 68 mL/min (by C-G formula based on SCr of 0.91 mg/dL). Liver Function Tests: Recent Labs  Lab 09/12/22 0121  ALBUMIN 2.2*   No results for input(s): "LIPASE", "AMYLASE" in the last 168 hours. No results for input(s): "AMMONIA" in the last 168 hours. Coagulation profile No results for input(s): "INR", "PROTIME" in the last 168 hours.  CBC: Recent Labs  Lab 09/10/22 0815 09/11/22 0302 09/11/22 1737 09/12/22 0121 09/13/22 0227 09/14/22 0235  WBC 3.0* 2.0*  --  3.2* 4.9 6.5  NEUTROABS 2.4  --   --  2.1  --   --   HGB 8.2* 7.4*  8.9* 8.0* 8.3* 8.8*  HCT 23.1* 21.6* 26.3* 22.0* 24.8* 25.6*  MCV 98.7 101.9*  --  95.2 98.0 97.3  PLT 124* 100*  --  78* 95* 107*   Cardiac Enzymes: No results for input(s): "CKTOTAL", "CKMB", "CKMBINDEX", "TROPONINI" in the last 168 hours. BNP: Invalid input(s): "POCBNP" CBG: Recent Labs  Lab 09/14/22 1613 09/14/22 2130 09/15/22 0753 09/15/22 1236 09/15/22 1617  GLUCAP 104* 116* 129* 87 101*   D-Dimer No results for input(s): "DDIMER" in the last 72 hours. Hgb A1c No results for input(s): "HGBA1C" in the last 72 hours. Lipid Profile No results for input(s): "CHOL", "HDL", "LDLCALC", "TRIG", "CHOLHDL", "LDLDIRECT" in the last 72 hours. Thyroid function studies No results for input(s): "TSH", "T4TOTAL", "T3FREE", "THYROIDAB" in the last 72 hours.  Invalid input(s): "FREET3" Anemia work up No results for input(s): "VITAMINB12", "FOLATE", "FERRITIN", "TIBC", "IRON", "RETICCTPCT" in the last 72 hours. Microbiology Recent Results (from the past 240 hour(s))  Urine Culture (for pregnant, neutropenic or urologic patients or patients with an indwelling urinary catheter)     Status: Abnormal   Collection Time: 09/09/22  9:10 AM   Specimen: Urine, Catheterized  Result Value Ref Range Status   Specimen Description URINE, CATHETERIZED  Final   Special Requests   Final    Immunocompromised Performed at [facility], [address], [place], [place] [address]    Culture >=100,000 COLONIES/mL ENTEROCOCCUS FAECALIS (A)  Final   Report Status 09/11/2022 FINAL  Final   Organism ID, Bacteria ENTEROCOCCUS FAECALIS (A)  Final      Susceptibility   Enterococcus faecalis - MIC*    AMPICILLIN <=2 SENSITIVE Sensitive     NITROFURANTOIN <=16 SENSITIVE Sensitive     VANCOMYCIN 1 SENSITIVE Sensitive     LINEZOLID 2 SENSITIVE Sensitive     * >=100,000 COLONIES/mL ENTEROCOCCUS FAECALIS  Culture, blood (Routine X 2) w Reflex to ID Panel     Status: Abnormal   Collection Time: 09/09/22   9:32 AM   Specimen: BLOOD RIGHT ARM  Result Value Ref Range Status   Specimen Description BLOOD RIGHT ARM  Final   Special Requests   Final    BOTTLES DRAWN AEROBIC AND ANAEROBIC Blood Culture adequate volume   Culture  Setup Time   Final    GRAM POSITIVE COCCI IN CHAINS IN BOTH AEROBIC AND ANAEROBIC BOTTLES Organism ID to follow CRITICAL RESULT CALLED TO, READ BACK BY AND VERIFIED WITH: T RUDISILL,[email or web address]  09/09/22 [place] Performed at [facility], [place] [address] [place], [place] [address]    Culture ENTEROCOCCUS FAECALIS (A)  Final   Report Status 09/11/2022 FINAL  Final   Organism ID, Bacteria ENTEROCOCCUS FAECALIS  Final      Susceptibility   Enterococcus faecalis - MIC*    AMPICILLIN <=2 SENSITIVE Sensitive     VANCOMYCIN 1 SENSITIVE Sensitive     GENTAMICIN SYNERGY SENSITIVE Sensitive     LINEZOLID 2 SENSITIVE Sensitive     * ENTEROCOCCUS FAECALIS  Blood Culture ID Panel (Reflexed)     Status: Abnormal   Collection Time: 09/09/22  9:32 AM  Result Value Ref Range Status   Enterococcus faecalis DETECTED (A) NOT DETECTED Final    Comment: CRITICAL RESULT CALLED TO, READ BACK BY AND VERIFIED WITH: T RUDISILL,[email or web address]  09/09/22 [place]    Enterococcus Faecium NOT DETECTED NOT DETECTED Final   Listeria monocytogenes NOT DETECTED NOT DETECTED Final   Staphylococcus species NOT DETECTED NOT DETECTED Final   Staphylococcus aureus (BCID) NOT DETECTED NOT DETECTED Final   Staphylococcus epidermidis NOT DETECTED NOT DETECTED Final   Staphylococcus lugdunensis NOT DETECTED NOT DETECTED Final   Streptococcus species NOT DETECTED NOT DETECTED Final   Streptococcus agalactiae NOT DETECTED NOT DETECTED Final   Streptococcus pneumoniae NOT DETECTED NOT DETECTED Final   Streptococcus pyogenes NOT DETECTED NOT DETECTED Final   A.calcoaceticus-baumannii NOT DETECTED NOT DETECTED Final   Bacteroides fragilis NOT DETECTED NOT DETECTED Final   Enterobacterales NOT DETECTED NOT DETECTED  Final   Enterobacter cloacae complex NOT DETECTED NOT DETECTED Final   Escherichia coli NOT DETECTED NOT DETECTED Final   Klebsiella aerogenes NOT DETECTED NOT DETECTED Final   Klebsiella oxytoca NOT DETECTED NOT DETECTED Final   Klebsiella pneumoniae NOT DETECTED NOT DETECTED Final   Proteus species NOT DETECTED NOT DETECTED Final   Salmonella species NOT DETECTED NOT DETECTED Final   Serratia marcescens NOT DETECTED NOT DETECTED Final   Haemophilus influenzae NOT DETECTED NOT DETECTED Final   Neisseria meningitidis NOT DETECTED NOT DETECTED Final   Pseudomonas aeruginosa NOT DETECTED NOT DETECTED Final   Stenotrophomonas maltophilia NOT DETECTED NOT DETECTED Final   Candida albicans NOT DETECTED NOT DETECTED Final   Candida auris NOT DETECTED NOT DETECTED Final   Candida glabrata NOT DETECTED NOT DETECTED Final   Candida krusei NOT DETECTED NOT DETECTED Final   Candida parapsilosis NOT DETECTED NOT DETECTED Final   Candida tropicalis NOT DETECTED NOT DETECTED Final   Cryptococcus neoformans/gattii NOT DETECTED NOT  DETECTED Final   Vancomycin resistance NOT DETECTED NOT DETECTED Final    Comment: Performed at [facility], [place] [address] [place], [place] [address]  Culture, blood (Routine X 2) w Reflex to ID Panel     Status: Abnormal   Collection Time: 09/09/22  9:34 AM   Specimen: BLOOD RIGHT ARM  Result Value Ref Range Status   Specimen Description BLOOD RIGHT ARM  Final   Special Requests   Final    BOTTLES DRAWN AEROBIC AND ANAEROBIC Blood Culture adequate volume   Culture  Setup Time   Final    GRAM POSITIVE COCCI IN BOTH AEROBIC AND ANAEROBIC BOTTLES CRITICAL VALUE NOTED.  VALUE IS CONSISTENT WITH PREVIOUSLY REPORTED AND CALLED VALUE.    Culture (A)  Final    ENTEROCOCCUS FAECALIS SUSCEPTIBILITIES PERFORMED ON PREVIOUS CULTURE WITHIN THE LAST 5 DAYS. Performed at [facility], [place] [address], [place], [place] [address]    Report Status 09/11/2022 FINAL   Final  Culture, blood (Routine X 2) w Reflex to ID Panel     Status: None   Collection Time: 09/10/22  8:15 AM   Specimen: BLOOD  Result Value Ref Range Status   Specimen Description BLOOD RIGHT ANTECUBITAL  Final   Special Requests   Final    BOTTLES DRAWN AEROBIC AND ANAEROBIC Blood Culture adequate volume   Culture   Final    NO GROWTH 5 DAYS Performed at [facility], [place] [address] [place], [place] [address]    Report Status 09/15/2022 FINAL  Final  Culture, blood (Routine X 2) w Reflex to ID Panel     Status: None   Collection Time: 09/10/22  8:19 AM   Specimen: BLOOD  Result Value Ref Range Status   Specimen Description BLOOD RIGHT ANTECUBITAL  Final   Special Requests   Final    BOTTLES DRAWN AEROBIC AND ANAEROBIC Blood Culture adequate volume   Culture   Final    NO GROWTH 5 DAYS Performed at [facility], [place] [address] [place], [place] [address]    Report Status 09/15/2022 FINAL  Final  Culture, blood (Routine X 2) w Reflex to ID Panel     Status: None   Collection Time: 09/11/22 11:18 AM   Specimen: BLOOD  Result Value Ref Range Status   Specimen Description BLOOD BLOOD RIGHT HAND  Final   Special Requests   Final    BOTTLES DRAWN AEROBIC AND ANAEROBIC Blood Culture adequate volume   Culture   Final    NO GROWTH 5 DAYS Performed at [facility], [place] [address], [place], [place] [address]    Report Status 09/16/2022 FINAL  Final  Culture, blood (Routine X 2) w Reflex to ID Panel     Status: None   Collection Time: 09/11/22 11:18 AM   Specimen: BLOOD  Result Value Ref Range Status   Specimen Description BLOOD LEFT ANTECUBITAL  Final   Special Requests   Final    BOTTLES DRAWN AEROBIC AND ANAEROBIC Blood Culture adequate volume   Culture   Final    NO GROWTH 5 DAYS Performed at [facility], [place] [address], [place], [place] [address]    Report Status 09/16/2022 FINAL  Final     Discharge Instructions:   Discharge Instructions      Amb Referral to Cardiac Rehabilitation   Complete by: As directed    Diagnosis:  Coronary Stents STEMI  After initial evaluation and assessments completed: Virtual Based Care may be provided alone or in conjunction with Phase 2 Cardiac Rehab based on patient barriers.: Yes   Intensive Cardiac Rehabilitation (ICR) [place] location only OR Traditional Cardiac Rehabilitation (TCR) *If criteria for ICR are not met will enroll in TCR [facility] only): Yes   Call MD for:  temperature >100.4   Complete by: As directed    Diet - low sodium heart healthy   Complete by: As directed    Discharge instructions   Complete by: As directed    Follow-up with your primary care provider in 1 week.  Check blood work at that time.  Complete the course of antibiotic as prescribed.  Follow-up with your Infectious disease on 10/19/2022 at 3:30 PM with Dr. Drucilla Schmidt.  Follow-up with cardiology as outpatient as scheduled by the clinic.   Increase activity slowly   Complete by: As directed       Allergies as of 09/15/2022   No Known Allergies      Medication List     TAKE these medications    acetaminophen 500 MG tablet Commonly known as: TYLENOL Take 1,000 mg by mouth every 6 (six) hours as needed for mild pain or headache.   amoxicillin 500 MG capsule Commonly known as: AMOXIL Take 2 capsules (1,000 mg total) by mouth 3 (three) times daily for 9 days.   Aspirin Low Dose 81 MG chewable tablet Generic drug: aspirin Chew 1 tablet (81 mg total) by mouth daily.   Brilinta 90 MG Tabs tablet Generic drug: ticagrelor Take 1 tablet (90 mg total) by mouth 2 (two) times daily.   fluticasone 50 MCG/ACT nasal spray Commonly known as: FLONASE Place 1 spray into both nostrils daily as needed for allergies or rhinitis.   lidocaine-prilocaine cream Commonly known as: EMLA Apply to the Port-A-Cath site 30-60 minutes before chemotherapy treatment What changed:  how much to take how to take this when to take  this   metoprolol tartrate 25 MG tablet Commonly known as: LOPRESSOR Take 0.5 tablets (12.5 mg total) by mouth 2 (two) times daily.   multivitamin tablet Take 1 tablet by mouth daily with breakfast.   prochlorperazine 10 MG tablet Commonly known as: COMPAZINE Take 1 tablet (10 mg total) by mouth every 6 (six) hours as needed for nausea or vomiting.   rosuvastatin 20 MG tablet Commonly known as: CRESTOR TAKE 1 TABLET BY MOUTH EVERY DAY What changed: when to take this   Systane Ultra PF 0.4-0.3 % Soln Generic drug: Polyethyl Glyc-Propyl Glyc PF Place 1 drop into both eyes 3 (three) times daily as needed (for dryness/irritation).   tamsulosin 0.4 MG Caps capsule Commonly known as: FLOMAX Take 0.4 mg by mouth 2 (two) times daily.   Zinc 25 MG Tabs Take 25 mg by mouth daily.        Follow-up Information     Tommy Medal, [place] Islam, MD Follow up on 10/19/2022.   Specialty: Infectious Diseases Why: Hospital Discharge Follow Up @ 3:30 pm  Please arrive 30 min early to acount for parking and registration. Contact information: [address] [address] [phone]         Ria Bush, MD Follow up in 1 week(s).   Specialty: Family Medicine Contact information: [place] [address] [phone]         Leonie Man, MD Follow up in 1 month(s).   Specialty: Cardiology Why: for heart followup Contact  information: [address] [place] [address] [phone]                  Time coordinating discharge: 39 minutes  Signed:  Daniele Yankowski  [facility] 09/16/2022, 1:20 PM

## 2022-09-16 ENCOUNTER — Telehealth: Payer: Self-pay | Admitting: *Deleted

## 2022-09-16 LAB — CULTURE, BLOOD (ROUTINE X 2)
Culture: NO GROWTH
Culture: NO GROWTH
Special Requests: ADEQUATE
Special Requests: ADEQUATE

## 2022-09-16 MED FILL — "Thrombin-Sodium Carboxymethylcellulose-CaCl Pad 3"" X 3""": CUTANEOUS | Qty: 1 | Status: AC

## 2022-09-16 NOTE — Transitions of Care (Post Inpatient/ED Visit) (Signed)
   09/16/2022  Name: Larry Kent MRN: [id number] DOB: 1946/03/11  Today's TOC FU Call Status: Today's TOC FU Call Status:: Unsuccessul Call (1st Attempt) Unsuccessful Call (1st Attempt) Date: 09/16/22  Attempted to reach the patient regarding the most recent Inpatient/ED visit.  Follow Up Plan: Additional outreach attempts will be made to reach the patient to complete the Transitions of Care (Post Inpatient/ED visit) call.   [facility] [phone]

## 2022-09-17 ENCOUNTER — Telehealth: Payer: Self-pay | Admitting: *Deleted

## 2022-09-17 ENCOUNTER — Ambulatory Visit (HOSPITAL_COMMUNITY)
Admission: RE | Admit: 2022-09-17 | Discharge: 2022-09-17 | Disposition: A | Discharge status: Home / Self Care | Payer: Medicare Other | Source: Ambulatory Visit | Attending: Internal Medicine | Admitting: Internal Medicine

## 2022-09-17 DIAGNOSIS — N1330 Unspecified hydronephrosis: Secondary | ICD-10-CM | POA: Diagnosis not present

## 2022-09-17 DIAGNOSIS — C3490 Malignant neoplasm of unspecified part of unspecified bronchus or lung: Secondary | ICD-10-CM

## 2022-09-17 DIAGNOSIS — R918 Other nonspecific abnormal finding of lung field: Secondary | ICD-10-CM | POA: Diagnosis not present

## 2022-09-17 DIAGNOSIS — J90 Pleural effusion, not elsewhere classified: Secondary | ICD-10-CM | POA: Diagnosis not present

## 2022-09-17 DIAGNOSIS — N200 Calculus of kidney: Secondary | ICD-10-CM | POA: Diagnosis not present

## 2022-09-17 MED ORDER — IOHEXOL 300 MG/ML  SOLN
100.0000 mL | Freq: Once | INTRAMUSCULAR | Status: AC | PRN
  Administered 2022-09-17: 100 mL via INTRAVENOUS

## 2022-09-17 MED ORDER — SODIUM CHLORIDE (PF) 0.9 % IJ SOLN
INTRAMUSCULAR | Status: AC
  Filled 2022-09-17: qty 50

## 2022-09-17 NOTE — Transitions of Care (Post Inpatient/ED Visit) (Signed)
   09/17/2022  Name: Larry Kent MRN: [id number] DOB: 1945-10-27  Today's TOC FU Call Status: Today's TOC FU Call Status:: Successful TOC FU Call Competed TOC FU Call Complete Date: 09/17/22  Transition Care Management Follow-up Telephone Call Date of Discharge: 09/13/22 Discharge Facility: [facility]) Type of Discharge: Inpatient Admission Primary Inpatient Discharge Diagnosis:: Enteroccocal bacteremia How have you been since you were released from the hospital?: Better (Just trying to take it a day at a time) Any questions or concerns?: No  Items Reviewed: Medications obtained and verified?: Yes (Medications Reviewed) Any new allergies since your discharge?: No Dietary orders reviewed?: No Do you have support at home?: Yes Name of Support/Comfort Primary Source: [facility] and Equipment/Supplies: [place] Ordered?: No Any new equipment or medical supplies ordered?: No  Functional Questionnaire: Do you need assistance with bathing/showering or dressing?: No Do you need assistance with meal preparation?: Yes Do you need assistance with eating?: No Do you have difficulty maintaining continence: No Do you need assistance with getting out of bed/getting out of a chair/moving?: No Do you have difficulty managing or taking your medications?: No  Follow up appointments reviewed: PCP Follow-up appointment confirmed?: No (Patient wants to make the appointment himself. Refused RN to call for appt) MD Provider Line Number:[phone] Given: No Specialist Hospital Follow-up appointment confirmed?: Yes Date of Specialist follow-up appointment?: 10/19/22 Follow-Up Specialty Provider:: Dr Tommy Medal [phone] 3:30 Do you need transportation to your follow-up appointment?: No Do you understand care options if your condition(s) worsen?: Yes-patient verbalized understanding  SDOH Interventions Today    Flowsheet Row Most Recent Value  SDOH Interventions   Food  Insecurity Interventions Intervention Not Indicated  Housing Interventions Intervention Not Indicated  Transportation Interventions Intervention Not Indicated      Interventions Today    Flowsheet Row Most Recent Value  General Interventions   General Interventions Discussed/Reviewed General Interventions Discussed, General Interventions Reviewed, Doctor Visits  Doctor Visits Discussed/Reviewed Specialist  PCP/Specialist Visits Compliance with follow-up visit        [facility] [phone]

## 2022-09-18 MED FILL — Dexamethasone Sodium Phosphate Inj 100 MG/10ML: INTRAMUSCULAR | Qty: 1 | Status: AC

## 2022-09-18 MED FILL — Fosaprepitant Dimeglumine For IV Infusion 150 MG (Base Eq): INTRAVENOUS | Qty: 5 | Status: AC

## 2022-09-21 ENCOUNTER — Inpatient Hospital Stay: Payer: Medicare Other

## 2022-09-21 ENCOUNTER — Telehealth: Payer: Self-pay | Admitting: Cardiovascular Disease

## 2022-09-21 ENCOUNTER — Other Ambulatory Visit: Payer: Self-pay

## 2022-09-21 ENCOUNTER — Inpatient Hospital Stay (HOSPITAL_BASED_OUTPATIENT_CLINIC_OR_DEPARTMENT_OTHER): Payer: Medicare Other | Admitting: Internal Medicine

## 2022-09-21 ENCOUNTER — Encounter: Payer: Self-pay | Admitting: Internal Medicine

## 2022-09-21 ENCOUNTER — Telehealth: Payer: Self-pay | Admitting: Internal Medicine

## 2022-09-21 VITALS — BP 129/67 | HR 71 | Temp 97.8°F | Resp 17 | Wt 174.5 lb

## 2022-09-21 DIAGNOSIS — C7A1 Malignant poorly differentiated neuroendocrine tumors: Secondary | ICD-10-CM

## 2022-09-21 DIAGNOSIS — Z5111 Encounter for antineoplastic chemotherapy: Secondary | ICD-10-CM | POA: Diagnosis not present

## 2022-09-21 DIAGNOSIS — Z79899 Other long term (current) drug therapy: Secondary | ICD-10-CM | POA: Diagnosis not present

## 2022-09-21 DIAGNOSIS — C61 Malignant neoplasm of prostate: Secondary | ICD-10-CM | POA: Diagnosis present

## 2022-09-21 LAB — TSH: TSH: 1.229 u[IU]/mL (ref 0.350–4.500)

## 2022-09-21 LAB — CBC WITH DIFFERENTIAL (CANCER CENTER ONLY)
Abs Immature Granulocytes: 0.09 10*3/uL — ABNORMAL HIGH (ref 0.00–0.07)
Basophils Absolute: 0.1 10*3/uL (ref 0.0–0.1)
Basophils Relative: 1 %
Eosinophils Absolute: 0.1 10*3/uL (ref 0.0–0.5)
Eosinophils Relative: 1 %
HCT: 27.8 % — ABNORMAL LOW (ref 39.0–52.0)
Hemoglobin: 9.5 g/dL — ABNORMAL LOW (ref 13.0–17.0)
Immature Granulocytes: 1 %
Lymphocytes Relative: 10 %
Lymphs Abs: 1.3 10*3/uL (ref 0.7–4.0)
MCH: 33.8 pg (ref 26.0–34.0)
MCHC: 34.2 g/dL (ref 30.0–36.0)
MCV: 98.9 fL (ref 80.0–100.0)
Monocytes Absolute: 0.9 10*3/uL (ref 0.1–1.0)
Monocytes Relative: 7 %
Neutro Abs: 10.3 10*3/uL — ABNORMAL HIGH (ref 1.7–7.7)
Neutrophils Relative %: 80 %
Platelet Count: 309 10*3/uL (ref 150–400)
RBC: 2.81 MIL/uL — ABNORMAL LOW (ref 4.22–5.81)
RDW: 15.8 % — ABNORMAL HIGH (ref 11.5–15.5)
WBC Count: 12.7 10*3/uL — ABNORMAL HIGH (ref 4.0–10.5)
nRBC: 0 % (ref 0.0–0.2)

## 2022-09-21 LAB — CMP (CANCER CENTER ONLY)
ALT: 33 U/L (ref 0–44)
AST: 26 U/L (ref 15–41)
Albumin: 3.3 g/dL — ABNORMAL LOW (ref 3.5–5.0)
Alkaline Phosphatase: 148 U/L — ABNORMAL HIGH (ref 38–126)
Anion gap: 6 (ref 5–15)
BUN: 23 mg/dL (ref 8–23)
CO2: 24 mmol/L (ref 22–32)
Calcium: 8.9 mg/dL (ref 8.9–10.3)
Chloride: 108 mmol/L (ref 98–111)
Creatinine: 1.05 mg/dL (ref 0.61–1.24)
GFR, Estimated: >60 mL/min (ref >60)
Glucose, Bld: 141 mg/dL — ABNORMAL HIGH (ref 70–99)
Potassium: 4.1 mmol/L (ref 3.5–5.1)
Sodium: 138 mmol/L (ref 135–145)
Total Bilirubin: 0.4 mg/dL (ref 0.3–1.2)
Total Protein: 6.6 g/dL (ref 6.5–8.1)

## 2022-09-21 MED FILL — Dexamethasone Sodium Phosphate Inj 100 MG/10ML: INTRAMUSCULAR | Qty: 1 | Status: AC

## 2022-09-21 NOTE — Telephone Encounter (Signed)
Scheduled per 03/25 los and work-queue, patient has been called and notified of upcoming appointments.

## 2022-09-21 NOTE — Progress Notes (Signed)
[place] [phone]   [phone]) [phone]  OFFICE PROGRESS NOTE  Ria Bush, MD [place] [address]  DIAGNOSIS:  1) Metastatic poorly differentiated carcinoma, neuroendocrine carcinoma of suspicious prostate primary versus primary lung cancer presented with innumerable and bilateral pulmonary nodules in addition to right hilar and mediastinal lymphadenopathy and metastatic disease to several areas of the bone in addition to hypermetabolic activity in the prostate gland diagnosed in January 2024  2) prostate adenocarcinoma diagnosed in February 2024 currently managed by Dr. Junious Silk   PRIOR THERAPY: None   CURRENT THERAPY:  Palliative systemic chemotherapy with carboplatin for AUC of 5 and Imfinzi 1500 Mg IV on day 1 as well as etoposide 100 Mg/M2 on days 1, 2 and 3 with Neulasta support on day 5. Status post 2 cycles. First dose on 08/11/22.  Starting from cycle #2 his carboplatin will be reduced to AUC of 4 and 2 etoposide 80 Mg/M2 secondary to intolerance and cycle #1.  INTERVAL HISTORY: Larry Kent 77 y.o. male returns to the clinic today for follow-up visit accompanied by his son Merry Proud.  The patient is feeling fine today with no concerning complaints except for the baseline fatigue and shortness of breath with exertion.  He was admitted to the hospital recently with acute STEMI of inferior wall.  He was also found during his hospitalization to have enterococcal bacteremia and his Port-A-Cath was removed.  He is currently on treatment with oral amoxicillin that will be completed on September 23, 2022.  He denied having any current chest pain, cough or hemoptysis.  He has no nausea, vomiting, diarrhea or constipation.  He has no headache or visual changes.  He lost several pounds recently.  He had repeat CT scan of the chest, abdomen and pelvis performed recently and he is here for evaluation and discussion of his scan results and  treatment options.   MEDICAL HISTORY: Past Medical History:  Diagnosis Date   Allergy    Arthritis    both knees    BCC (basal cell carcinoma), arm, right 09/2019   MOHS ([place])   BCC (basal cell carcinoma), face 2014   L preauricular s/p MOHS   BCC (basal cell carcinoma), face 02/2018   L upper lip   BPH (benign prostatic hypertrophy)    Dr. Junious Silk @ [facility]   Carotid stenosis [id number]   RICA [id number], LICA 123456, L vertebral occlusion, rpt 1 yr   Cataract    removed both eyes    FHx: colon cancer    FHx: prostate cancer    Heart murmur    mild aortic stenosis    History of kidney stones    Hyperlipidemia    borderline- on rosuvastatin now normal    Occlusion of right vertebral artery 05/2015    ALLERGIES:  has No Known Allergies.  MEDICATIONS:  Current Outpatient Medications  Medication Sig Dispense Refill   acetaminophen (TYLENOL) 500 MG tablet Take 1,000 mg by mouth every 6 (six) hours as needed for mild pain or headache.     amoxicillin (AMOXIL) 500 MG capsule Take 2 capsules (1,000 mg total) by mouth 3 (three) times daily for 9 days. 54 capsule 0   aspirin 81 MG chewable tablet Chew 1 tablet (81 mg total) by mouth daily. 30 tablet 11   fluticasone (FLONASE) 50 MCG/ACT nasal spray Place 1 spray into both nostrils daily as needed for allergies or rhinitis.     lidocaine-prilocaine (  EMLA) cream Apply to the Port-A-Cath site 30-60 minutes before chemotherapy treatment (Patient taking differently: Apply 1 Application topically See admin instructions. Apply to the Port-A-Cath site 30-60 minutes before chemotherapy treatment) 30 g 0   metoprolol tartrate (LOPRESSOR) 25 MG tablet Take 0.5 tablets (12.5 mg total) by mouth 2 (two) times daily. 30 tablet 11   Multiple Vitamin (MULTIVITAMIN) tablet Take 1 tablet by mouth daily with breakfast.     prochlorperazine (COMPAZINE) 10 MG tablet Take 1 tablet (10 mg total) by mouth every 6 (six) hours as needed for nausea or vomiting.  30 tablet 0   rosuvastatin (CRESTOR) 20 MG tablet TAKE 1 TABLET BY MOUTH EVERY DAY (Patient taking differently: Take 20 mg by mouth in the morning.) 90 tablet 1   SYSTANE ULTRA PF 0.4-0.3 % SOLN Place 1 drop into both eyes 3 (three) times daily as needed (for dryness/irritation).     tamsulosin (FLOMAX) 0.4 MG CAPS capsule Take 0.4 mg by mouth 2 (two) times daily.     ticagrelor (BRILINTA) 90 MG TABS tablet Take 1 tablet (90 mg total) by mouth 2 (two) times daily. 60 tablet 11   Zinc 25 MG TABS Take 25 mg by mouth daily.     No current facility-administered medications for this visit.    SURGICAL HISTORY:  Past Surgical History:  Procedure Laterality Date   BASAL CELL CARCINOMA EXCISION  05/2018   lip, 2020 x3  basal cells removed    CATARACT EXTRACTION  2008   Left   CATARACT EXTRACTION  2013   Right ([facility])   COLONOSCOPY  ~2010   medium int hemorrhoids, o/w WNL, rpt 5 yrs given fmhx (Dr. Oletta Lamas)   COLONOSCOPY  08/2019   TAs, diverticulosis, rpt 3 yrs ([place])   CORONARY/GRAFT ACUTE MI REVASCULARIZATION N/A 09/07/2022   Procedure: Coronary/Graft Acute MI Revascularization;  Surgeon: Early Osmond, MD;  Location: [facility];  Service: Cardiovascular;  Laterality: N/A;   ENDARTERECTOMY Right 12/01/2019   Procedure: RIGHT ENDARTERECTOMY CAROTID;  Surgeon: [name], MD;  Location: [facility];  Service: Vascular;  Laterality: Right;   exercise treadmill  11/2005   WNL [place])   FINE NEEDLE ASPIRATION  07/28/2022   Procedure: FINE NEEDLE ASPIRATION (FNA) LINEAR;  Surgeon: Garner Nash, DO;  Location: [facility];  Service: Pulmonary;;   HERNIA REPAIR  2008   Right   IR IMAGING GUIDED PORT INSERTION  08/11/2022   IR REMOVAL TUN ACCESS W/ PORT W/O FL MOD SED  09/10/2022   LEFT HEART CATH AND CORONARY ANGIOGRAPHY N/A 09/07/2022   Procedure: LEFT HEART CATH AND CORONARY ANGIOGRAPHY;  Surgeon: Early Osmond, MD;  Location: [facility];  Service:  Cardiovascular;  Laterality: N/A;   MOHS SURGERY Left spring 2014   basal cell face   PATCH ANGIOPLASTY Right 12/01/2019   Procedure: [place];  Surgeon: [name], MD;  Location: [facility];  Service: Vascular;  Laterality: Right;   Testicular Biopsy  2003   benign, varicocele   US ECHOCARDIOGRAPHY  11/2005   aortic sclerosis, EF 0000000, diastolic dysfunction   VIDEO BRONCHOSCOPY WITH ENDOBRONCHIAL ULTRASOUND Bilateral 07/28/2022   Procedure: VIDEO BRONCHOSCOPY WITH ENDOBRONCHIAL ULTRASOUND;  Surgeon: Garner Nash, DO;  Location: [facility];  Service: Pulmonary;  Laterality: Bilateral;    REVIEW OF SYSTEMS:  Constitutional: positive for fatigue and weight loss Eyes: negative Ears, nose, mouth, throat, and face: negative Respiratory: positive for dyspnea on exertion Cardiovascular:  negative Gastrointestinal: negative Genitourinary:negative Integument/breast: negative Hematologic/lymphatic: negative Musculoskeletal:negative Neurological: negative Behavioral/Psych: negative Endocrine: negative Allergic/Immunologic: negative   PHYSICAL EXAMINATION: General appearance: alert, cooperative, fatigued, and no distress Head: Normocephalic, without obvious abnormality, atraumatic Neck: no adenopathy, no JVD, supple, symmetrical, trachea midline, and thyroid not enlarged, symmetric, no tenderness/mass/nodules Lymph nodes: Cervical, supraclavicular, and axillary nodes normal. Resp: clear to auscultation bilaterally Back: symmetric, no curvature. ROM normal. No CVA tenderness. Cardio: regular rate and rhythm, S1, S2 normal, no murmur, click, rub or gallop GI: soft, non-tender; bowel sounds normal; no masses,  no organomegaly Extremities: extremities normal, atraumatic, no cyanosis or edema Neurologic: Alert and oriented X 3, normal strength and tone. Normal symmetric reflexes. Normal coordination and gait  ECOG PERFORMANCE STATUS: 1 -  Symptomatic but completely ambulatory  Blood pressure 129/67, pulse 71, temperature 97.8 F (36.6 C), temperature source Oral, resp. rate 17, weight 174 lb 8 oz (79.2 kg), SpO2 100 %.  LABORATORY DATA: Lab Results  Component Value Date   WBC 12.7 (H) 09/21/2022   HGB 9.5 (L) 09/21/2022   HCT 27.8 (L) 09/21/2022   MCV 98.9 09/21/2022   PLT 309 09/21/2022      Chemistry      Component Value Date/Time   NA 138 09/21/2022 0958   K 4.1 09/21/2022 0958   CL 108 09/21/2022 0958   CO2 24 09/21/2022 0958   BUN 23 09/21/2022 0958   CREATININE 1.05 09/21/2022 0958      Component Value Date/Time   CALCIUM 8.9 09/21/2022 0958   ALKPHOS 148 (H) 09/21/2022 0958   AST 26 09/21/2022 0958   ALT 33 09/21/2022 0958   BILITOT 0.4 09/21/2022 0958       RADIOGRAPHIC STUDIES: CT Chest W Contrast  Result Date: 09/18/2022 CLINICAL DATA:  Metastatic lung cancer restaging, recent pausing chemotherapy due to myocardial infarction * Tracking Code: BO * EXAM: CT CHEST, ABDOMEN, AND PELVIS WITH CONTRAST TECHNIQUE: Multidetector CT imaging of the chest, abdomen and pelvis was performed following the standard protocol during bolus administration of intravenous contrast. RADIATION DOSE REDUCTION: This exam was performed according to the departmental dose-optimization program which includes automated exposure control, adjustment of the mA and/or kV according to patient size and/or use of iterative reconstruction technique. CONTRAST:  174mL OMNIPAQUE IOHEXOL 300 MG/ML  SOLN COMPARISON:  CT abdomen pelvis, 08/18/2022, PET-CT, 07/15/2022 FINDINGS: CT CHEST FINDINGS Cardiovascular: Evidence of recently removed port catheter (series 2, image 15). Aortic atherosclerosis. Aortic valve calcifications. Normal heart size. Three-vessel coronary artery calcifications. No pericardial effusion. Mediastinum/Nodes: Interval decrease in size of enlarged, previously FDG avid right hilar and subcarinal lymph nodes, subcarinal node  measuring up to 2.0 x 1.6 cm, previously 2.9 x 1.8 cm (series 2, image 28). Thyroid gland, trachea, and esophagus demonstrate no significant findings. Lungs/Pleura: Numerous bilateral pulmonary nodules of varying sizes, the majority not significantly changed, for example in the peripheral left upper lobe measuring 1.5 x 1.4 cm (series 4, image 54). There are some however which are slightly diminished in size and fullness, for example in the right middle lobe measuring 1.6 x 1.2 cm, previously 1.9 x 1.5 cm (series 4, image 87), and other generally very small nodules which are slightly increased in size, for example a 0.5 cm nodule in the left upper lobe, previously 0.3 cm (series 4, image 58). Unchanged small left pleural effusion. Musculoskeletal: No chest wall abnormality. No acute osseous findings. CT ABDOMEN PELVIS FINDINGS Hepatobiliary: No solid liver abnormality is seen. No gallstones, gallbladder wall thickening,  or biliary dilatation. Pancreas: Unremarkable. No pancreatic ductal dilatation or surrounding inflammatory changes. Spleen: Normal in size without significant abnormality. Adrenals/Urinary Tract: Adrenal glands are unremarkable. Mild right hydronephrosis and hydroureter, similar to prior examination. Interval resolution of previously seen left-sided hydronephrosis. Small nonobstructive right renal calculus. No ureteral calculi. Distended urinary bladder. Stomach/Bowel: Stomach is within normal limits. Appendix appears normal. No evidence of bowel wall thickening, distention, or inflammatory changes. Descending and sigmoid diverticulosis. Vascular/Lymphatic: Aortic atherosclerosis. No enlarged abdominal or pelvic lymph nodes. Reproductive: Mild prostatomegaly. Other: No abdominal wall hernia or abnormality. No ascites. Musculoskeletal: No acute osseous findings. Interval increase in sclerosis of previously seen FDG avid osseous metastases, for example in posterior aspect L3 (series 2, image 69) and in  the bilateral pubic rami (series 2, image 123). IMPRESSION: 1. Interval decrease in size of enlarged, previously FDG avid right hilar and subcarinal lymph nodes, consistent with treatment response. 2. Numerous bilateral pulmonary nodules of varying sizes, overall with little interval change, however some nodules slightly diminished in size, other nodules slightly enlarged, consistent with mixed response to treatment. 3. Interval post treatment sclerosis previously FDG avid osseous metastatic disease. 4. No evidence of new metastatic disease in the chest, abdomen, or pelvis. 5. Mild right hydronephrosis and hydroureter, similar to prior examination. Interval resolution of previously seen left-sided hydronephrosis. Distended urinary bladder. Correlate with 6. Nonobstructive right nephrolithiasis. 7. Coronary artery disease. 8. Aortic valve calcifications. Correlate for echocardiographic evidence of aortic valve dysfunction. Aortic Atherosclerosis (ICD10-I70.0). Electronically Signed   By: Delanna Ahmadi M.D.   On: 09/18/2022 22:21   CT Abdomen Pelvis W Contrast  Result Date: 09/18/2022 CLINICAL DATA:  Metastatic lung cancer restaging, recent pausing chemotherapy due to myocardial infarction * Tracking Code: BO * EXAM: CT CHEST, ABDOMEN, AND PELVIS WITH CONTRAST TECHNIQUE: Multidetector CT imaging of the chest, abdomen and pelvis was performed following the standard protocol during bolus administration of intravenous contrast. RADIATION DOSE REDUCTION: This exam was performed according to the departmental dose-optimization program which includes automated exposure control, adjustment of the mA and/or kV according to patient size and/or use of iterative reconstruction technique. CONTRAST:  148mL OMNIPAQUE IOHEXOL 300 MG/ML  SOLN COMPARISON:  CT abdomen pelvis, 08/18/2022, PET-CT, 07/15/2022 FINDINGS: CT CHEST FINDINGS Cardiovascular: Evidence of recently removed port catheter (series 2, image 15). Aortic  atherosclerosis. Aortic valve calcifications. Normal heart size. Three-vessel coronary artery calcifications. No pericardial effusion. Mediastinum/Nodes: Interval decrease in size of enlarged, previously FDG avid right hilar and subcarinal lymph nodes, subcarinal node measuring up to 2.0 x 1.6 cm, previously 2.9 x 1.8 cm (series 2, image 28). Thyroid gland, trachea, and esophagus demonstrate no significant findings. Lungs/Pleura: Numerous bilateral pulmonary nodules of varying sizes, the majority not significantly changed, for example in the peripheral left upper lobe measuring 1.5 x 1.4 cm (series 4, image 54). There are some however which are slightly diminished in size and fullness, for example in the right middle lobe measuring 1.6 x 1.2 cm, previously 1.9 x 1.5 cm (series 4, image 87), and other generally very small nodules which are slightly increased in size, for example a 0.5 cm nodule in the left upper lobe, previously 0.3 cm (series 4, image 58). Unchanged small left pleural effusion. Musculoskeletal: No chest wall abnormality. No acute osseous findings. CT ABDOMEN PELVIS FINDINGS Hepatobiliary: No solid liver abnormality is seen. No gallstones, gallbladder wall thickening, or biliary dilatation. Pancreas: Unremarkable. No pancreatic ductal dilatation or surrounding inflammatory changes. Spleen: Normal in size without significant abnormality. Adrenals/Urinary Tract: Adrenal  glands are unremarkable. Mild right hydronephrosis and hydroureter, similar to prior examination. Interval resolution of previously seen left-sided hydronephrosis. Small nonobstructive right renal calculus. No ureteral calculi. Distended urinary bladder. Stomach/Bowel: Stomach is within normal limits. Appendix appears normal. No evidence of bowel wall thickening, distention, or inflammatory changes. Descending and sigmoid diverticulosis. Vascular/Lymphatic: Aortic atherosclerosis. No enlarged abdominal or pelvic lymph nodes.  Reproductive: Mild prostatomegaly. Other: No abdominal wall hernia or abnormality. No ascites. Musculoskeletal: No acute osseous findings. Interval increase in sclerosis of previously seen FDG avid osseous metastases, for example in posterior aspect L3 (series 2, image 69) and in the bilateral pubic rami (series 2, image 123). IMPRESSION: 1. Interval decrease in size of enlarged, previously FDG avid right hilar and subcarinal lymph nodes, consistent with treatment response. 2. Numerous bilateral pulmonary nodules of varying sizes, overall with little interval change, however some nodules slightly diminished in size, other nodules slightly enlarged, consistent with mixed response to treatment. 3. Interval post treatment sclerosis previously FDG avid osseous metastatic disease. 4. No evidence of new metastatic disease in the chest, abdomen, or pelvis. 5. Mild right hydronephrosis and hydroureter, similar to prior examination. Interval resolution of previously seen left-sided hydronephrosis. Distended urinary bladder. Correlate with 6. Nonobstructive right nephrolithiasis. 7. Coronary artery disease. 8. Aortic valve calcifications. Correlate for echocardiographic evidence of aortic valve dysfunction. Aortic Atherosclerosis (ICD10-I70.0). Electronically Signed   By: Delanna Ahmadi M.D.   On: 09/18/2022 22:21   IR REMOVAL TUN ACCESS W/ PORT W/O FL MOD SED  Result Date: 09/10/2022 INDICATION: 77 year old with Enterococcus bacteremia. Request for Port-A-Cath removal. EXAM: REMOVAL RIGHT IJ VEIN PORT-A-CATH MEDICATIONS: Versed 1 mg ANESTHESIA/SEDATION: Patient was monitored by a radiology nurse throughout the procedure. FLUOROSCOPY: None COMPLICATIONS: None immediate. PROCEDURE: Informed written consent was obtained from the patient after a thorough discussion of the procedural risks, benefits and alternatives. All questions were addressed. Maximal Sterile Barrier Technique was utilized including caps, mask, sterile  gowns, sterile gloves, sterile drape, hand hygiene and skin antiseptic. A timeout was performed prior to the initiation of the procedure. The right chest was prepped and draped in a sterile fashion. Lidocaine was utilized for local anesthesia. An incision was made over the previously healed surgical incision. Utilizing blunt dissection, the port catheter and reservoir were removed from the underlying subcutaneous tissue in their entirety. The pocket was irrigated with a copious amount of sterile normal saline. The subcutaneous tissue was closed with 3-0 Vicryl interrupted subcutaneous stitches. A 4-0 Vicryl running subcuticular stitch was utilized to approximate the skin. Dermabond was applied. FINDINGS: Port was completely removed. Port pocket appeared healthy without fluid or drainage. IMPRESSION: Successful right IJ vein Port-A-Cath explant. Electronically Signed   By: Markus Daft M.D.   On: 09/10/2022 21:19   DG Chest Port 1 View  Result Date: 09/09/2022 CLINICAL DATA:  Tachycardia EXAM: PORTABLE CHEST 1 VIEW COMPARISON:  CXR 08/22/22 FINDINGS: No pleural effusion. No pneumothorax. Right chest port with tip near the cavoatrial junction. Compared to prior exam there are new patchy airspace opacities at the bilateral lung bases, which could represent atelectasis or infection. No radiographically apparent displaced rib fractures. Visualized upper abdomen is unremarkable. IMPRESSION: Compared to prior exam there are new patchy airspace opacities at the bilateral lung bases, which could represent atelectasis or infection. Electronically Signed   By: Marin Roberts M.D.   On: 09/09/2022 09:55   ECHOCARDIOGRAM COMPLETE  Result Date: 09/08/2022    ECHOCARDIOGRAM REPORT   Patient Name:   AVIK HESSION [facility] Date  of Exam: 09/08/2022 Medical Rec #:  [id number]       Height:       69.0 in Accession #:    [id number]      Weight:       164.9 lb Date of Birth:  1945/08/28       BSA:          1.903 m Patient Age:    65 years         BP:           110/65 mmHg Patient Gender: M               HR:           67 bpm. Exam Location:  Inpatient Procedure: 2D Echo, 3D Echo, Cardiac Doppler, Color Doppler and Intracardiac            Opacification Agent Indications:    Acute MI  History:        Patient has prior history of Echocardiogram examinations, most                 recent 10/22/2020. Carotid Disease, Signs/Symptoms:Murmur and                 Chest Pain; Risk Factors:Dyslipidemia. Hx of cancer,                 chemotherapy.  Sonographer:    Eartha Inch Referring Phys: [id number] Early Osmond  Sonographer Comments: Image acquisition challenging due to patient body habitus and Image acquisition challenging due to respiratory motion. IMPRESSIONS  1. Left ventricular ejection fraction, by estimation, is 65 to 70%. The left ventricle has normal function. The left ventricle demonstrates regional wall motion abnormalities (see scoring diagram/findings for description). There is moderate concentric left ventricular hypertrophy. Left ventricular diastolic parameters are indeterminate.  2. Right ventricular systolic function is normal. The right ventricular size is moderately enlarged. There is normal pulmonary artery systolic pressure.  3. Right atrial size was mildly dilated.  4. The mitral valve is degenerative. No evidence of mitral valve regurgitation. No evidence of mitral stenosis. The mean mitral valve gradient is 3.0 mmHg with average heart rate of 66 bpm. Severe mitral annular calcification.  5. Mixed valve disease. Central aortic regurgitation, normal LV stroke volume index. The aortic valve is calcified. Aortic valve regurgitation is mild to moderate. Mild aortic valve stenosis. Aortic valve mean gradient measures 14.0 mmHg.  6. The inferior vena cava is normal in size with greater than 50% respiratory variability, suggesting right atrial pressure of 3 mmHg. Comparison(s): Prior images reviewed side by side. Similar aortic findings. LV wall  motion comparison is difficult- WMA seen best on contrast images, prior contrast images not performed. FINDINGS  Left Ventricle: Left ventricular ejection fraction, by estimation, is 65 to 70%. The left ventricle has normal function. The left ventricle demonstrates regional wall motion abnormalities. The left ventricular internal cavity size was normal in size. There is moderate concentric left ventricular hypertrophy. Left ventricular diastolic parameters are indeterminate.  LV Wall Scoring: The mid and distal anterior septum and mid inferoseptal segment are hypokinetic. Right Ventricle: The right ventricular size is moderately enlarged. No increase in right ventricular wall thickness. Right ventricular systolic function is normal. There is normal pulmonary artery systolic pressure. The tricuspid regurgitant velocity is 2.00 m/s, and with an assumed right atrial pressure of 3 mmHg, the estimated right ventricular systolic pressure is [id number] mmHg. Left Atrium: Left atrial size was normal in size. Right  Atrium: Right atrial size was mildly dilated. Pericardium: There is no evidence of pericardial effusion. Mitral Valve: The mitral valve is degenerative in appearance. Severe mitral annular calcification. No evidence of mitral valve regurgitation. No evidence of mitral valve stenosis. MV peak gradient, 9.5 mmHg. The mean mitral valve gradient is 3.0 mmHg with average heart rate of 66 bpm. Tricuspid Valve: The tricuspid valve is normal in structure. Tricuspid valve regurgitation is trivial. No evidence of tricuspid stenosis. Aortic Valve: Mixed valve disease. Central aortic regurgitation, normal LV stroke volume index. The aortic valve is calcified. Aortic valve regurgitation is mild to moderate. Aortic regurgitation PHT measures 651 msec. Mild aortic stenosis is present. Aortic valve mean gradient measures 14.0 mmHg. Aortic valve peak gradient measures 21.9 mmHg. Aortic valve area, by VTI measures 2.18 cm. Pulmonic  Valve: The pulmonic valve was not well visualized. Pulmonic valve regurgitation is not visualized. Aorta: The aortic root and ascending aorta are structurally normal, with no evidence of dilitation. Venous: The inferior vena cava is normal in size with greater than 50% respiratory variability, suggesting right atrial pressure of 3 mmHg. IAS/Shunts: No atrial level shunt detected by color flow Doppler.  LEFT VENTRICLE PLAX 2D LVIDd:         4.10 cm      Diastology LVIDs:         2.70 cm      LV e' medial:    5.11 cm/s LV PW:         1.30 cm      LV E/e' medial:  19.4 LV IVS:        1.15 cm      LV e' lateral:   7.40 cm/s LVOT diam:     2.10 cm      LV E/e' lateral: 13.4 LV SV:         108 LV SV Index:   57 LVOT Area:     3.46 cm  LV Volumes (MOD) LV vol d, MOD A2C: 97.7 ml LV vol d, MOD A4C: 134.0 ml LV vol s, MOD A2C: 26.3 ml LV vol s, MOD A4C: 48.9 ml LV SV MOD A2C:     71.4 ml LV SV MOD A4C:     134.0 ml LV SV MOD BP:      77.7 ml RIGHT VENTRICLE             IVC RV S prime:     13.70 cm/s  IVC diam: 1.80 cm TAPSE (M-mode): 2.0 cm LEFT ATRIUM             Index        RIGHT ATRIUM           Index LA diam:        3.90 cm 2.05 cm/m   RA Area:     21.00 cm LA Vol (A2C):   65.0 ml 34.15 ml/m  RA Volume:   66.00 ml  34.68 ml/m LA Vol (A4C):   53.2 ml 27.95 ml/m LA Biplane Vol: 61.3 ml 32.21 ml/m  AORTIC VALVE AV Area (Vmax):    2.06 cm AV Area (Vmean):   1.85 cm AV Area (VTI):     2.18 cm AV Vmax:           234.00 cm/s AV Vmean:          181.000 cm/s AV VTI:            0.495 m AV Peak Grad:      21.9  mmHg AV Mean Grad:      14.0 mmHg LVOT Vmax:         139.00 cm/s LVOT Vmean:        96.800 cm/s LVOT VTI:          0.312 m LVOT/AV VTI ratio: 0.63 AI PHT:            651 msec  AORTA Ao Root diam: 3.20 cm Ao Asc diam:  3.60 cm MITRAL VALVE                TRICUSPID VALVE MV Area (PHT): 2.76 cm     TR Peak grad:   16.0 mmHg MV Area VTI:   2.22 cm     TR Vmax:        200.00 cm/s MV Peak grad:  9.5 mmHg MV Mean grad:   3.0 mmHg     SHUNTS MV Vmax:       1.54 m/s     Systemic VTI:  0.31 m MV Vmean:      88.1 cm/s    Systemic Diam: 2.10 cm MV Decel Time: 275 msec MV E velocity: 99.00 cm/s MV A velocity: 127.00 cm/s MV E/A ratio:  0.78 Rudean Haskell MD Electronically signed by Rudean Haskell MD Signature Date/Time: 09/08/2022/1:09:21 PM    Final    CARDIAC CATHETERIZATION  Result Date: 09/07/2022   Ost RCA lesion is 80% stenosed.   Prox RCA lesion is 70% stenosed.   Mid LM to Dist LM lesion is 50% stenosed.   Dist LM to Ost LAD lesion is 50% stenosed.   Ost Cx lesion is 40% stenosed.   Prox Cx lesion is 60% stenosed.   Mid LAD lesion is 60% stenosed.   A stent was successfully placed.   Post intervention, there is a 0% residual stenosis.   Post intervention, there is a 0% residual stenosis. 1.  Thrombotic occlusion of right coronary artery treated with 4 overlapping drug-eluting stents. 2.  Distal left main, ostial LAD, and ostial left circumflex disease. 3.  LVEDP of 11 mmHg. Recommendation: The results were discussed with Dr. Ellyn Hack.  The patient will be admitted to the hospital, medical therapy will be pursued as well as an echocardiogram.  Will follow-up as an outpatient regarding treatment for residual disease.    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 The patient has  been tolerating this treatment well.  He was recently diagnosed with acute STEMI and currently on treatment with aspirin, Brilinta as well as metoprolol. The patient was also diagnosed with Enterococcus bacteremia during his hospitalization and he is currently on treatment with amoxicillin and expected to be completed on September 23, 2022.  His Port-A-Cath was removed. He had repeat CT scan of the chest, abdomen and pelvis performed recently.  I personally and independently reviewed the scan images and discussed the results with the patient and his son.  His scan showed interval decrease in the size of the right hilar and subcarinal lymph nodes as well as decrease in the size of numerous bilateral pulmonary nodules but there was a slight increase in some other nodules. I recommended for the patient to continue his current systemic chemotherapy but I will delay the start of cycle number 3 by 1 week to give him more time to recover. Will use peripheral IV for the next cycle of his treatment and then consider placing a Port-A-Cath before starting cycle #4.  2) prostate adenocarcinoma diagnosed in February 2024 currently managed by Dr. Junious Silk The patient was advised to call immediately if he has any other concerning symptoms in the interval. The patient voices understanding of current disease status and treatment options and is in agreement with the current care plan.  All questions were answered. The patient knows to call the clinic with any problems, questions or concerns. We can certainly see the patient much sooner if necessary.  The total time spent in the appointment was 30 minutes.  Disclaimer: This note was dictated with voice recognition software. Similar sounding words can inadvertently be transcribed and may not be corrected upon review.

## 2022-09-21 NOTE — Telephone Encounter (Signed)
Patient is asking if he can start chemotherapy back in 2 weeks.  He states his concern is not being strong enough and having another heart attack.  He is "borderline" on if he feels ready.  He states he can get up, dressed, walk down to make breakfast and his heart rate will be 100-105.  When going upstairs, he can make it to the second level but has to rest at the top.  He will see his PCP April 1st.  He does not want to wait until his appt. With Dr Fletcher Anon at end of April before a decision can be made.  He ask for recommendations or sooner appt. To discuss.  He does want his chemo because he has not had it for 2 weeks while in ICU but again he is afraid of having a heart attack if too soon to resume.

## 2022-09-21 NOTE — Telephone Encounter (Signed)
Patient called stating they want to resume his chemo treatments in two weeks he wants to make sure he is strong enough to restart treatments.

## 2022-09-22 ENCOUNTER — Inpatient Hospital Stay: Payer: Medicare Other

## 2022-09-22 NOTE — Progress Notes (Signed)
Cardiology Office Note:   Date:  09/25/2022  ID:  Larry Kent, DOB April 29, 1946, MRN [id number]  History of Present Illness:   Larry Kent is a 77 y.o. male with past medical history of carotid disease status post right carotid endarterectomy 11/2019, mild aortic stenosis, hyperlipidemia,nephrolithiasis, history of static prostate cancer and lung nodules with pathology showing neuroendocrine carcinoma with the diagnosis made in 06/2022, and coronary artery disease, who is here today for follow-up after recent hospitalization after being admitted and treated for an inferior on STEMI 09/07/22.   Patient was seen on 09/07/2022 at the cancer center to have his port flushed. He started complaining of chest tightness and shortness of breath without exertion.  Chest pain is been intermittent and worsening since about 8 AM today.  Patient was sent to the [facility] emergency department for evaluation.  EKG in the emergency department revealed sinus rhythm with ST elevation in leads II, III and aVF consistent with an inferior STEMI.  He was then further transferred to [facility] for cardiac catheterization.  He was emergently taken to the Cath Lab was found to have thrombotic occlusion of the right coronary artery treated with 4 overlapping drug-eluting stents, distal left main, ostial LAD, ostial left circumflex disease, LVEDP of 11 mmHg.  Echocardiogram was completed and revealed an LVEF of 65 to 70%, the left ventricle at that time demonstrated regional wall motion abnormalities, there was moderate concentric LVH, left ventricular diastolic parameters were indeterminate.  The right ventricular size is moderately enlarged, right atrial size is mildly dilated, the mitral valve was degenerative, severe mitral annular calcification, aortic valve regurgitation was mild to moderate, and mild aortic valve stenosis.  Plan was to continue to maximize medical management with aspirin and Brilinta for minimum of 12  months given 4 stent placements and high risk for occlusion.  He was continued on rosuvastatin 40 mg daily, metoprolol tartrate was to be transited to Toprol at discharge, he was to continue with cardiac rehab.  Patient returns to clinic today with several questions.  He denies any chest pain.  Continues to have some shortness of breath walking up stairs and  fatigue.  Denies any bleeding or increased bruising since starting on ASA and Brilinta. He also had questions about his aortic stenosis to ensure his stenosis has not worsened.  He had questions about current medications.  And stated that oncology had already rescheduled him for chemotherapy starting again next week.  He brought in his previous regimen paperwork and it was noted that one of the medications at a moderate risk of increased bleeding when given with his Brilinta.  He was advised that he is unable to hold his Brilinta at this time and that oncology may need to consider changing his regimen if that is needed.  ROS: Endorses dyspnea and fatigue. Remainder of the 10 point review of symptom is negative.  Studies Reviewed:    EKG:  sinus rate of 73, previous inferior infarct  TTE 09/08/22 1. Left ventricular ejection fraction, by estimation, is 65 to 70%. The  left ventricle has normal function. The left ventricle demonstrates  regional wall motion abnormalities (see scoring diagram/findings for  description). There is moderate concentric  left ventricular hypertrophy. Left ventricular diastolic parameters are  indeterminate.   2. Right ventricular systolic function is normal. The right ventricular  size is moderately enlarged. There is normal pulmonary artery systolic  pressure.   3. Right atrial size was mildly dilated.   4.  The mitral valve is degenerative. No evidence of mitral valve  regurgitation. No evidence of mitral stenosis. The mean mitral valve  gradient is 3.0 mmHg with average heart rate of 66 bpm. Severe mitral   annular calcification.   5. Mixed valve disease. Central aortic regurgitation, normal LV stroke  volume index. The aortic valve is calcified. Aortic valve regurgitation is  mild to moderate. Mild aortic valve stenosis. Aortic valve mean gradient  measures 14.0 mmHg.   6. The inferior vena cava is normal in size with greater than 50%  respiratory variability, suggesting right atrial pressure of 3 mmHg.    LHC 09/07/22   Ost RCA lesion is 80% stenosed.   Prox RCA lesion is 70% stenosed.   Mid LM to Dist LM lesion is 50% stenosed.   Dist LM to Ost LAD lesion is 50% stenosed.   Ost Cx lesion is 40% stenosed.   Prox Cx lesion is 60% stenosed.   Mid LAD lesion is 60% stenosed.   A stent was successfully placed.   Post intervention, there is a 0% residual stenosis.   Post intervention, there is a 0% residual stenosis.   1.  Thrombotic occlusion of right coronary artery treated with 4 overlapping drug-eluting stents. 2.  Distal left main, ostial LAD, and ostial left circumflex disease. 3.  LVEDP of 11 mmHg.   Recommendation: The results were discussed with Dr. Ellyn Hack.  The patient will be admitted to the hospital, medical therapy will be pursued as well as an echocardiogram.  Will follow-up as an outpatient regarding treatment for residual disease.  Diagnostic Dominance: Right  Intervention      Risk Assessment/Calculations:              Physical Exam:   VS:  BP (!) 102/58 (BP Location: Left Arm, Patient Position: Sitting, Cuff Size: Normal)   Pulse 73   Ht 5\' 9"  (1.753 m)   Wt 176 lb (79.8 kg)   SpO2 100%   BMI 25.99 kg/m    Wt Readings from Last 3 Encounters:  09/25/22 176 lb (79.8 kg)  09/21/22 174 lb 8 oz (79.2 kg)  09/11/22 169 lb 1.5 oz (76.7 kg)     GEN: Well nourished, well developed in no acute distress NECK: No JVD; No carotid bruits CARDIAC: RRR, II/VI systolic murmur, without rubs, gallops, right radial site 2+ pulse without bruising noted, right groin  2 RESPIRATORY:  Clear to auscultation without rales, wheezing or rhonchi  ABDOMEN: Soft, non-tender, non-distended EXTREMITIES:  No edema; No deformity   ASSESSMENT AND PLAN:   Coronary artery disease with recent inferior STEMI on 09/07/2022 who underwent cardiac catheterization with DES placement x 4 to the RCA.  He remains chest pain-free today without ischemic changes noted on EKG.  He has been continued on aspirin and Brilinta for minimum of 12 months as he is at risk for reocclusion.  He is also continued on rosuvastatin.  He is also was discharged on metoprolol tartrate 12.5 mg twice daily can consider transitioning to Toprol on return.  He has been referred back to cardiac rehab.  Findings right radial without significant bruising and 2+ radial pulse with right groin with elevated bruising without pain and discomfort.  He has been encouraged to continue with heart healthy diet as well is to continue with his activity as tolerated until he gets back with cardiac rehab.  With drug interaction.  Taking chemotherapy for his Emend shows a moderate risk of drug to drug interaction  with an increased incidence of bleeding. This was written on his papers he had with him as well to take with him to his follow up with oncology. He was advised that he is unable to stop/hold Brilinta due to high risk of reocclusion.  The patient was also scheduled for a stress PET scan as recommended on discharge 1 month after his event.  Last echocardiogram was done 3/25 for that LVEF of 165-75% with mild hypokinesis in the mid inferior septal segments as well as the mid to distal anterior septal segments suspect anterior portion hypokinesis in the setting of not stented mid LAD stenosis.  Will need continued follow-up by repeat echocardiogram in the next several months especially considering the reinitiation of chemotherapy.  Hyperlipidemia with last LDL of 64 and remains at goal.  He is continued on rosuvastatin.  Aortic  stenosis with heart murmur noted on exam.  Recent echocardiogram done during hospitalization revealed continued mild aortic stenosis.  Patient had several questions about his stenosis was advised that he would need to continue on with his his cholesterol medication as previously prescribed and scheduled.   Carotid artery disease with carotid endarterectomy 11/2019.  His last carotid duplex done 08/2021 revealed no evidence of stenosis in the right ICA and left ICA was consistent with 1 to 39% stenosis.  This continues to be followed by vascular.  Metastatic neuroendocrine carcinoma with advanced prostate cancer.  He is scheduled for chemotherapy in the upcoming week will need a CBC and CMP at that time.  We did advise him of the previous treatment interactions with Brilinta that was only 1 medication that would be an issue.  Advised him to follow-up with oncology as well.  Disposition patient return to clinic to see MD/APP (unlikely primary cardiologist Dr. Fletcher Anon) after stress PET scan has been completed.    Cardiac Rehabilitation Eligibility Assessment      Shared Decision Making/Informed Consent The risks [chest pain, shortness of breath, cardiac arrhythmias, dizziness, blood pressure fluctuations, myocardial infarction, stroke/transient ischemic attack, nausea, vomiting, allergic reaction, radiation exposure, metallic taste sensation and life-threatening complications (estimated to be 1 in 10,000)], benefits (risk stratification, diagnosing coronary artery disease, treatment guidance) and alternatives of a cardiac PET stress test were discussed in detail with Mr. Parello and he agrees to proceed.   Signed, Tegan Britain, NP

## 2022-09-22 NOTE — Telephone Encounter (Signed)
He should be seen in our [place] office by an APP in 1 to 2 weeks to discuss cardiac issues before chemotherapy.  It is not really acceptable to have his appointment so far out after myocardial infarction.  He lives in [place] and should be able to make it to the [place] office.

## 2022-09-22 NOTE — Telephone Encounter (Signed)
The patient has an appointment on 3/29 already scheduled.

## 2022-09-23 ENCOUNTER — Inpatient Hospital Stay: Payer: Medicare Other

## 2022-09-23 DIAGNOSIS — R3912 Poor urinary stream: Secondary | ICD-10-CM | POA: Diagnosis not present

## 2022-09-23 LAB — T4: T4, Total: 6.8 ug/dL (ref 4.5–12.0)

## 2022-09-23 NOTE — Telephone Encounter (Signed)
Patient returned RN's call. 

## 2022-09-23 NOTE — Telephone Encounter (Signed)
Spoke with patient and confirmed his appt. For [place] office this Friday at 8:25am

## 2022-09-23 NOTE — Telephone Encounter (Signed)
LVM for patient to call office.

## 2022-09-25 ENCOUNTER — Encounter: Payer: Self-pay | Admitting: Cardiology

## 2022-09-25 ENCOUNTER — Inpatient Hospital Stay: Payer: Medicare Other

## 2022-09-25 ENCOUNTER — Ambulatory Visit: Payer: Medicare Other | Attending: Nurse Practitioner | Admitting: Cardiology

## 2022-09-25 VITALS — BP 102/58 | HR 73 | Ht 69.0 in | Wt 176.0 lb

## 2022-09-25 DIAGNOSIS — I350 Nonrheumatic aortic (valve) stenosis: Secondary | ICD-10-CM | POA: Diagnosis not present

## 2022-09-25 DIAGNOSIS — C3490 Malignant neoplasm of unspecified part of unspecified bronchus or lung: Secondary | ICD-10-CM

## 2022-09-25 DIAGNOSIS — I779 Disorder of arteries and arterioles, unspecified: Secondary | ICD-10-CM

## 2022-09-25 DIAGNOSIS — E785 Hyperlipidemia, unspecified: Secondary | ICD-10-CM | POA: Diagnosis not present

## 2022-09-25 DIAGNOSIS — I25110 Atherosclerotic heart disease of native coronary artery with unstable angina pectoris: Secondary | ICD-10-CM | POA: Diagnosis not present

## 2022-09-25 DIAGNOSIS — I2119 ST elevation (STEMI) myocardial infarction involving other coronary artery of inferior wall: Secondary | ICD-10-CM

## 2022-09-25 MED FILL — Dexamethasone Sodium Phosphate Inj 100 MG/10ML: INTRAMUSCULAR | Qty: 1 | Status: AC

## 2022-09-25 MED FILL — Fosaprepitant Dimeglumine For IV Infusion 150 MG (Base Eq): INTRAVENOUS | Qty: 5 | Status: AC

## 2022-09-25 NOTE — Patient Instructions (Addendum)
Medication Instructions:  No changes at this time.   *If you need a refill on your cardiac medications before your next appointment, please call your pharmacy*   Lab Work: None  If you have labs (blood work) drawn today and your tests are completely normal, you will receive your results only by: [place] (if you have MyChart) OR A paper copy in the mail If you have any lab test that is abnormal or we need to change your treatment, we will call you to review the results.   Testing/Procedures: How to Prepare for Your Cardiac PET/CT Stress Test:  1. Please do not take Metoprolol or any of these medications before your test:   Medications that may interfere with the cardiac pharmacological stress agent (ex. nitrates - including erectile dysfunction medications, isosorbide mononitrate, tamulosin or beta-blockers) the day of the exam. (Erectile dysfunction medication should be held for at least 72 hrs prior to test) Theophylline containing medications for 12 hours. Dipyridamole 48 hours prior to the test. Your remaining medications may be taken with water.  2. Nothing to eat or drink, except water, 3 hours prior to arrival time.   NO caffeine/decaffeinated products, or chocolate 12 hours prior to arrival.  3. NO perfume, cologne or lotion  4. Total time is 1 to 2 hours; you may want to bring reading material for the waiting time.  5. Please report to Radiology at the [facility] Main Entrance 30 minutes early for your test.  [place], [place] [address]  Diabetic Preparation:  Hold oral medications. You may take NPH and Lantus insulin. Do not take Humalog or Humulin R (Regular Insulin) the day of your test. Check blood sugars prior to leaving the house. If able to eat breakfast prior to 3 hour fasting, you may take all medications, including your insulin, Do not worry if you miss your breakfast dose of insulin - start at your next meal.  IF YOU  THINK YOU MAY BE PREGNANT, OR ARE NURSING PLEASE INFORM THE TECHNOLOGIST.  In preparation for your appointment, medication and supplies will be purchased.  Appointment availability is limited, so if you need to cancel or reschedule, please call the Radiology Department at [phone]  24 hours in advance to avoid a cancellation fee of $100.00  What to Expect After you Arrive:  Once you arrive and check in for your appointment, you will be taken to a preparation room within the Radiology Department.  A technologist or Nurse will obtain your medical history, verify that you are correctly prepped for the exam, and explain the procedure.  Afterwards,  an IV will be started in your arm and electrodes will be placed on your skin for EKG monitoring during the stress portion of the exam. Then you will be escorted to the PET/CT scanner.  There, staff will get you positioned on the scanner and obtain a blood pressure and EKG.  During the exam, you will continue to be connected to the EKG and blood pressure machines.  A small, safe amount of a radioactive tracer will be injected in your IV to obtain a series of pictures of your heart along with an injection of a stress agent.    After your Exam:  It is recommended that you eat a meal and drink a caffeinated beverage to counter act any effects of the stress agent.  Drink plenty of fluids for the remainder of the day and urinate frequently for the first couple of hours  after the exam.  Your doctor will inform you of your test results within 7-10 business days.  For questions about your test or how to prepare for your test, please call: Marchia Bond, Cardiac Imaging Nurse Navigator  Gordy Clement, Cardiac Imaging Nurse Navigator Office: [phone]    Follow-Up: At [facility], you and your health needs are our priority.  As part of our continuing mission to provide you with exceptional heart care, we have created designated Provider Care Teams.   These Care Teams include your primary Cardiologist (physician) and Advanced Practice Providers (APPs -  Physician Assistants and Nurse Practitioners) who all work together to provide you with the care you need, when you need it.   Your next appointment:   Follow up after testing with Dr. Fletcher Anon  Provider:   Kathlyn Sacramento, MD

## 2022-09-27 NOTE — Progress Notes (Unsigned)
[facility] OFFICE PROGRESS NOTE  Ria Bush, MD [place] [address]  DIAGNOSIS: 1) Metastatic poorly differentiated carcinoma, neuroendocrine carcinoma of suspicious prostate primary versus primary lung cancer presented with innumerable and bilateral pulmonary nodules in addition to right hilar and mediastinal lymphadenopathy and metastatic disease to several areas of the bone in addition to hypermetabolic activity in the prostate gland diagnosed in January 2024  2) prostate adenocarcinoma diagnosed in February 2024 currently managed by Dr. Junious Silk  PRIOR THERAPY: None  CURRENT THERAPY:  Palliative systemic chemotherapy with carboplatin for AUC of 5 and Imfinzi 1500 Mg IV on day 1 as well as etoposide 100 Mg/M2 on days 1, 2 and 3 with Neulasta support on day 5. Status post 2 cycles. First dose on 08/11/22.  Starting from cycle #2 his carboplatin will be reduced to AUC of 4 and 2 etoposide 80 Mg/M2 secondary to intolerance and cycle #1.   INTERVAL HISTORY: NORIS HOHMAN 77 y.o. male returns to the clinic today for follow-up visit accompanied by ***the patient was recently found to have a STEMI on 09/06/2021 after complaining of chest pain and shortness of breath in the/room.  He is followed closely by cardiology and he is currently taking aspirin and Brilinta.  It was noted during a recent follow-up with cardiology that there is a drug to drug interaction with Emend and Brilinta.   Regarding his malignancy, the patient is status post 2 cycles.  He is struggling with intolerance and pancytopenia with cycle #1 that required dose reduction with cycle #2.  Supportive care?  He is here for cycle #3 today which was delayed by 1 week.  Since last being seen, he reports ***in his symptoms.  Fatigue?  Denies any fever, chills, night sweats, or unexplained weight loss.  Denies any current chest pain, cough, or hemoptysis.  Reports shortness of breath with  exertion.  Denies any nausea, vomiting, diarrhea, or constipation.  Denies any headache or visual changes.  He is followed closely by Dr. Junious Silk from urology for his prostate cancer for which he is currently getting hormone therapy.  He is here today for evaluation and repeat blood work before considering cycle #2.  MEDICAL HISTORY: Past Medical History:  Diagnosis Date   Allergy    Arthritis    both knees    BCC (basal cell carcinoma), arm, right 09/2019   MOHS ([facility])   BCC (basal cell carcinoma), face 2014   L preauricular s/p MOHS   BCC (basal cell carcinoma), face 02/2018   L upper lip   BPH (benign prostatic hypertrophy)    Dr. Junious Silk @ [facility]   Carotid stenosis [id number]   RICA [id number], LICA 123456, L vertebral occlusion, rpt 1 yr   Cataract    removed both eyes    FHx: colon cancer    FHx: prostate cancer    Heart murmur    mild aortic stenosis    History of kidney stones    Hyperlipidemia    borderline- on rosuvastatin now normal    Occlusion of right vertebral artery 05/2015    ALLERGIES:  has No Known Allergies.  MEDICATIONS:  Current Outpatient Medications  Medication Sig Dispense Refill   acetaminophen (TYLENOL) 500 MG tablet Take 1,000 mg by mouth every 6 (six) hours as needed for mild pain or headache.     aspirin 81 MG chewable tablet Chew 1 tablet (81 mg total) by mouth daily. 30 tablet 11   fluticasone (FLONASE)  50 MCG/ACT nasal spray Place 1 spray into both nostrils daily as needed for allergies or rhinitis.     lidocaine-prilocaine (EMLA) cream Apply to the Port-A-Cath site 30-60 minutes before chemotherapy treatment (Patient taking differently: Apply 1 Application topically See admin instructions. Apply to the Port-A-Cath site 30-60 minutes before chemotherapy treatment) 30 g 0   metoprolol tartrate (LOPRESSOR) 25 MG tablet Take 0.5 tablets (12.5 mg total) by mouth 2 (two) times daily. 30 tablet 11   Multiple Vitamin (MULTIVITAMIN) tablet Take 1 tablet  by mouth daily with breakfast.     prochlorperazine (COMPAZINE) 10 MG tablet Take 1 tablet (10 mg total) by mouth every 6 (six) hours as needed for nausea or vomiting. 30 tablet 0   rosuvastatin (CRESTOR) 20 MG tablet TAKE 1 TABLET BY MOUTH EVERY DAY (Patient taking differently: Take 20 mg by mouth in the morning.) 90 tablet 1   SYSTANE ULTRA PF 0.4-0.3 % SOLN Place 1 drop into both eyes 3 (three) times daily as needed (for dryness/irritation).     tamsulosin (FLOMAX) 0.4 MG CAPS capsule Take 0.4 mg by mouth 2 (two) times daily.     ticagrelor (BRILINTA) 90 MG TABS tablet Take 1 tablet (90 mg total) by mouth 2 (two) times daily. 60 tablet 11   Zinc 25 MG TABS Take 25 mg by mouth daily.     No current facility-administered medications for this visit.    SURGICAL HISTORY:  Past Surgical History:  Procedure Laterality Date   BASAL CELL CARCINOMA EXCISION  05/2018   lip, 2020 x3  basal cells removed    CATARACT EXTRACTION  2008   Left   CATARACT EXTRACTION  2013   Right ([facility])   COLONOSCOPY  ~2010   medium int hemorrhoids, o/w WNL, rpt 5 yrs given fmhx (Dr. Oletta Lamas)   COLONOSCOPY  08/2019   TAs, diverticulosis, rpt 3 yrs ([facility])   CORONARY/GRAFT ACUTE MI REVASCULARIZATION N/A 09/07/2022   Procedure: Coronary/Graft Acute MI Revascularization;  Surgeon: Early Osmond, MD;  Location: [facility];  Service: Cardiovascular;  Laterality: N/A;   ENDARTERECTOMY Right 12/01/2019   Procedure: RIGHT ENDARTERECTOMY CAROTID;  Surgeon: [name], MD;  Location: [facility];  Service: Vascular;  Laterality: Right;   exercise treadmill  11/2005   WNL [place])   FINE NEEDLE ASPIRATION  07/28/2022   Procedure: FINE NEEDLE ASPIRATION (FNA) LINEAR;  Surgeon: Garner Nash, DO;  Location: [facility];  Service: Pulmonary;;   HERNIA REPAIR  2008   Right   IR IMAGING GUIDED PORT INSERTION  08/11/2022   IR REMOVAL TUN ACCESS W/ PORT W/O FL MOD SED  09/10/2022   LEFT HEART CATH AND  CORONARY ANGIOGRAPHY N/A 09/07/2022   Procedure: LEFT HEART CATH AND CORONARY ANGIOGRAPHY;  Surgeon: Early Osmond, MD;  Location: [facility];  Service: Cardiovascular;  Laterality: N/A;   MOHS SURGERY Left spring 2014   basal cell face   PATCH ANGIOPLASTY Right 12/01/2019   Procedure: [place];  Surgeon: [name], MD;  Location: [facility];  Service: Vascular;  Laterality: Right;   Testicular Biopsy  2003   benign, varicocele   US ECHOCARDIOGRAPHY  11/2005   aortic sclerosis, EF 0000000, diastolic dysfunction   VIDEO BRONCHOSCOPY WITH ENDOBRONCHIAL ULTRASOUND Bilateral 07/28/2022   Procedure: VIDEO BRONCHOSCOPY WITH ENDOBRONCHIAL ULTRASOUND;  Surgeon: Garner Nash, DO;  Location: [facility];  Service: Pulmonary;  Laterality: Bilateral;    REVIEW OF SYSTEMS:  Review of Systems  Constitutional: Negative for appetite change, chills, fatigue, fever and unexpected weight change.  HENT:   Negative for mouth sores, nosebleeds, sore throat and trouble swallowing.   Eyes: Negative for eye problems and icterus.  Respiratory: Negative for cough, hemoptysis, shortness of breath and wheezing.   Cardiovascular: Negative for chest pain and leg swelling.  Gastrointestinal: Negative for abdominal pain, constipation, diarrhea, nausea and vomiting.  Genitourinary: Negative for bladder incontinence, difficulty urinating, dysuria, frequency and hematuria.   Musculoskeletal: Negative for back pain, gait problem, neck pain and neck stiffness.  Skin: Negative for itching and rash.  Neurological: Negative for dizziness, extremity weakness, gait problem, headaches, light-headedness and seizures.  Hematological: Negative for adenopathy. Does not bruise/bleed easily.  Psychiatric/Behavioral: Negative for confusion, depression and sleep disturbance. The patient is not nervous/anxious.     PHYSICAL EXAMINATION:  There were no vitals taken for this  visit.  ECOG PERFORMANCE STATUS: {CHL ONC ECOG [id number]  Physical Exam  Constitutional: Oriented to person, place, and time and well-developed, well-nourished, and in no distress. No distress.  HENT:  Head: Normocephalic and atraumatic.  Mouth/Throat: Oropharynx is clear and moist. No oropharyngeal exudate.  Eyes: Conjunctivae are normal. Right eye exhibits no discharge. Left eye exhibits no discharge. No scleral icterus.  Neck: Normal range of motion. Neck supple.  Cardiovascular: Normal rate, regular rhythm, normal heart sounds and intact distal pulses.   Pulmonary/Chest: Effort normal and breath sounds normal. No respiratory distress. No wheezes. No rales.  Abdominal: Soft. Bowel sounds are normal. Exhibits no distension and no mass. There is no tenderness.  Musculoskeletal: Normal range of motion. Exhibits no edema.  Lymphadenopathy:    No cervical adenopathy.  Neurological: Alert and oriented to person, place, and time. Exhibits normal muscle tone. Gait normal. Coordination normal.  Skin: Skin is warm and dry. No rash noted. Not diaphoretic. No erythema. No pallor.  Psychiatric: Mood, memory and judgment normal.  Vitals reviewed.  LABORATORY DATA: Lab Results  Component Value Date   WBC 12.7 (H) 09/21/2022   HGB 9.5 (L) 09/21/2022   HCT 27.8 (L) 09/21/2022   MCV 98.9 09/21/2022   PLT 309 09/21/2022      Chemistry      Component Value Date/Time   NA 138 09/21/2022 0958   K 4.1 09/21/2022 0958   CL 108 09/21/2022 0958   CO2 24 09/21/2022 0958   BUN 23 09/21/2022 0958   CREATININE 1.05 09/21/2022 0958      Component Value Date/Time   CALCIUM 8.9 09/21/2022 0958   ALKPHOS 148 (H) 09/21/2022 0958   AST 26 09/21/2022 0958   ALT 33 09/21/2022 0958   BILITOT 0.4 09/21/2022 0958       RADIOGRAPHIC STUDIES:  CT Chest W Contrast  Result Date: 09/18/2022 CLINICAL DATA:  Metastatic lung cancer restaging, recent pausing chemotherapy due to myocardial infarction *  Tracking Code: BO * EXAM: CT CHEST, ABDOMEN, AND PELVIS WITH CONTRAST TECHNIQUE: Multidetector CT imaging of the chest, abdomen and pelvis was performed following the standard protocol during bolus administration of intravenous contrast. RADIATION DOSE REDUCTION: This exam was performed according to the departmental dose-optimization program which includes automated exposure control, adjustment of the mA and/or kV according to patient size and/or use of iterative reconstruction technique. CONTRAST:  153mL OMNIPAQUE IOHEXOL 300 MG/ML  SOLN COMPARISON:  CT abdomen pelvis, 08/18/2022, PET-CT, 07/15/2022 FINDINGS: CT CHEST FINDINGS Cardiovascular: Evidence of recently removed port catheter (series 2, image 15). Aortic atherosclerosis. Aortic valve calcifications. Normal heart  size. Three-vessel coronary artery calcifications. No pericardial effusion. Mediastinum/Nodes: Interval decrease in size of enlarged, previously FDG avid right hilar and subcarinal lymph nodes, subcarinal node measuring up to 2.0 x 1.6 cm, previously 2.9 x 1.8 cm (series 2, image 28). Thyroid gland, trachea, and esophagus demonstrate no significant findings. Lungs/Pleura: Numerous bilateral pulmonary nodules of varying sizes, the majority not significantly changed, for example in the peripheral left upper lobe measuring 1.5 x 1.4 cm (series 4, image 54). There are some however which are slightly diminished in size and fullness, for example in the right middle lobe measuring 1.6 x 1.2 cm, previously 1.9 x 1.5 cm (series 4, image 87), and other generally very small nodules which are slightly increased in size, for example a 0.5 cm nodule in the left upper lobe, previously 0.3 cm (series 4, image 58). Unchanged small left pleural effusion. Musculoskeletal: No chest wall abnormality. No acute osseous findings. CT ABDOMEN PELVIS FINDINGS Hepatobiliary: No solid liver abnormality is seen. No gallstones, gallbladder wall thickening, or biliary dilatation.  Pancreas: Unremarkable. No pancreatic ductal dilatation or surrounding inflammatory changes. Spleen: Normal in size without significant abnormality. Adrenals/Urinary Tract: Adrenal glands are unremarkable. Mild right hydronephrosis and hydroureter, similar to prior examination. Interval resolution of previously seen left-sided hydronephrosis. Small nonobstructive right renal calculus. No ureteral calculi. Distended urinary bladder. Stomach/Bowel: Stomach is within normal limits. Appendix appears normal. No evidence of bowel wall thickening, distention, or inflammatory changes. Descending and sigmoid diverticulosis. Vascular/Lymphatic: Aortic atherosclerosis. No enlarged abdominal or pelvic lymph nodes. Reproductive: Mild prostatomegaly. Other: No abdominal wall hernia or abnormality. No ascites. Musculoskeletal: No acute osseous findings. Interval increase in sclerosis of previously seen FDG avid osseous metastases, for example in posterior aspect L3 (series 2, image 69) and in the bilateral pubic rami (series 2, image 123). IMPRESSION: 1. Interval decrease in size of enlarged, previously FDG avid right hilar and subcarinal lymph nodes, consistent with treatment response. 2. Numerous bilateral pulmonary nodules of varying sizes, overall with little interval change, however some nodules slightly diminished in size, other nodules slightly enlarged, consistent with mixed response to treatment. 3. Interval post treatment sclerosis previously FDG avid osseous metastatic disease. 4. No evidence of new metastatic disease in the chest, abdomen, or pelvis. 5. Mild right hydronephrosis and hydroureter, similar to prior examination. Interval resolution of previously seen left-sided hydronephrosis. Distended urinary bladder. Correlate with 6. Nonobstructive right nephrolithiasis. 7. Coronary artery disease. 8. Aortic valve calcifications. Correlate for echocardiographic evidence of aortic valve dysfunction. Aortic  Atherosclerosis (ICD10-I70.0). Electronically Signed   By: Delanna Ahmadi M.D.   On: 09/18/2022 22:21   CT Abdomen Pelvis W Contrast  Result Date: 09/18/2022 CLINICAL DATA:  Metastatic lung cancer restaging, recent pausing chemotherapy due to myocardial infarction * Tracking Code: BO * EXAM: CT CHEST, ABDOMEN, AND PELVIS WITH CONTRAST TECHNIQUE: Multidetector CT imaging of the chest, abdomen and pelvis was performed following the standard protocol during bolus administration of intravenous contrast. RADIATION DOSE REDUCTION: This exam was performed according to the departmental dose-optimization program which includes automated exposure control, adjustment of the mA and/or kV according to patient size and/or use of iterative reconstruction technique. CONTRAST:  12mL OMNIPAQUE IOHEXOL 300 MG/ML  SOLN COMPARISON:  CT abdomen pelvis, 08/18/2022, PET-CT, 07/15/2022 FINDINGS: CT CHEST FINDINGS Cardiovascular: Evidence of recently removed port catheter (series 2, image 15). Aortic atherosclerosis. Aortic valve calcifications. Normal heart size. Three-vessel coronary artery calcifications. No pericardial effusion. Mediastinum/Nodes: Interval decrease in size of enlarged, previously FDG avid right hilar and subcarinal lymph  nodes, subcarinal node measuring up to 2.0 x 1.6 cm, previously 2.9 x 1.8 cm (series 2, image 28). Thyroid gland, trachea, and esophagus demonstrate no significant findings. Lungs/Pleura: Numerous bilateral pulmonary nodules of varying sizes, the majority not significantly changed, for example in the peripheral left upper lobe measuring 1.5 x 1.4 cm (series 4, image 54). There are some however which are slightly diminished in size and fullness, for example in the right middle lobe measuring 1.6 x 1.2 cm, previously 1.9 x 1.5 cm (series 4, image 87), and other generally very small nodules which are slightly increased in size, for example a 0.5 cm nodule in the left upper lobe, previously 0.3 cm  (series 4, image 58). Unchanged small left pleural effusion. Musculoskeletal: No chest wall abnormality. No acute osseous findings. CT ABDOMEN PELVIS FINDINGS Hepatobiliary: No solid liver abnormality is seen. No gallstones, gallbladder wall thickening, or biliary dilatation. Pancreas: Unremarkable. No pancreatic ductal dilatation or surrounding inflammatory changes. Spleen: Normal in size without significant abnormality. Adrenals/Urinary Tract: Adrenal glands are unremarkable. Mild right hydronephrosis and hydroureter, similar to prior examination. Interval resolution of previously seen left-sided hydronephrosis. Small nonobstructive right renal calculus. No ureteral calculi. Distended urinary bladder. Stomach/Bowel: Stomach is within normal limits. Appendix appears normal. No evidence of bowel wall thickening, distention, or inflammatory changes. Descending and sigmoid diverticulosis. Vascular/Lymphatic: Aortic atherosclerosis. No enlarged abdominal or pelvic lymph nodes. Reproductive: Mild prostatomegaly. Other: No abdominal wall hernia or abnormality. No ascites. Musculoskeletal: No acute osseous findings. Interval increase in sclerosis of previously seen FDG avid osseous metastases, for example in posterior aspect L3 (series 2, image 69) and in the bilateral pubic rami (series 2, image 123). IMPRESSION: 1. Interval decrease in size of enlarged, previously FDG avid right hilar and subcarinal lymph nodes, consistent with treatment response. 2. Numerous bilateral pulmonary nodules of varying sizes, overall with little interval change, however some nodules slightly diminished in size, other nodules slightly enlarged, consistent with mixed response to treatment. 3. Interval post treatment sclerosis previously FDG avid osseous metastatic disease. 4. No evidence of new metastatic disease in the chest, abdomen, or pelvis. 5. Mild right hydronephrosis and hydroureter, similar to prior examination. Interval resolution of  previously seen left-sided hydronephrosis. Distended urinary bladder. Correlate with 6. Nonobstructive right nephrolithiasis. 7. Coronary artery disease. 8. Aortic valve calcifications. Correlate for echocardiographic evidence of aortic valve dysfunction. Aortic Atherosclerosis (ICD10-I70.0). Electronically Signed   By: Delanna Ahmadi M.D.   On: 09/18/2022 22:21   IR REMOVAL TUN ACCESS W/ PORT W/O FL MOD SED  Result Date: 09/10/2022 INDICATION: 77 year old with Enterococcus bacteremia. Request for Port-A-Cath removal. EXAM: REMOVAL RIGHT IJ VEIN PORT-A-CATH MEDICATIONS: Versed 1 mg ANESTHESIA/SEDATION: Patient was monitored by a radiology nurse throughout the procedure. FLUOROSCOPY: None COMPLICATIONS: None immediate. PROCEDURE: Informed written consent was obtained from the patient after a thorough discussion of the procedural risks, benefits and alternatives. All questions were addressed. Maximal Sterile Barrier Technique was utilized including caps, mask, sterile gowns, sterile gloves, sterile drape, hand hygiene and skin antiseptic. A timeout was performed prior to the initiation of the procedure. The right chest was prepped and draped in a sterile fashion. Lidocaine was utilized for local anesthesia. An incision was made over the previously healed surgical incision. Utilizing blunt dissection, the port catheter and reservoir were removed from the underlying subcutaneous tissue in their entirety. The pocket was irrigated with a copious amount of sterile normal saline. The subcutaneous tissue was closed with 3-0 Vicryl interrupted subcutaneous stitches. A 4-0 Vicryl running subcuticular  stitch was utilized to approximate the skin. Dermabond was applied. FINDINGS: Port was completely removed. Port pocket appeared healthy without fluid or drainage. IMPRESSION: Successful right IJ vein Port-A-Cath explant. Electronically Signed   By: Markus Daft M.D.   On: 09/10/2022 21:19   DG Chest Port 1 View  Result Date:  09/09/2022 CLINICAL DATA:  Tachycardia EXAM: PORTABLE CHEST 1 VIEW COMPARISON:  CXR 08/22/22 FINDINGS: No pleural effusion. No pneumothorax. Right chest port with tip near the cavoatrial junction. Compared to prior exam there are new patchy airspace opacities at the bilateral lung bases, which could represent atelectasis or infection. No radiographically apparent displaced rib fractures. Visualized upper abdomen is unremarkable. IMPRESSION: Compared to prior exam there are new patchy airspace opacities at the bilateral lung bases, which could represent atelectasis or infection. Electronically Signed   By: Marin Roberts M.D.   On: 09/09/2022 09:55   ECHOCARDIOGRAM COMPLETE  Result Date: 09/08/2022    ECHOCARDIOGRAM REPORT   Patient Name:   BILBO GIESKE Date of Exam: 09/08/2022 Medical Rec #:  [id number]       Height:       69.0 in Accession #:    [id number]      Weight:       164.9 lb Date of Birth:  Nov 14, 1945       BSA:          1.903 m Patient Age:    77 years        BP:           110/65 mmHg Patient Gender: M               HR:           67 bpm. Exam Location:  Inpatient Procedure: 2D Echo, 3D Echo, Cardiac Doppler, Color Doppler and Intracardiac            Opacification Agent Indications:    Acute MI  History:        Patient has prior history of Echocardiogram examinations, most                 recent 10/22/2020. Carotid Disease, Signs/Symptoms:Murmur and                 Chest Pain; Risk Factors:Dyslipidemia. Hx of cancer,                 chemotherapy.  Sonographer:    Eartha Inch Referring Phys: [id number] Early Osmond  Sonographer Comments: Image acquisition challenging due to patient body habitus and Image acquisition challenging due to respiratory motion. IMPRESSIONS  1. Left ventricular ejection fraction, by estimation, is 65 to 70%. The left ventricle has normal function. The left ventricle demonstrates regional wall motion abnormalities (see scoring diagram/findings for description). There is  moderate concentric left ventricular hypertrophy. Left ventricular diastolic parameters are indeterminate.  2. Right ventricular systolic function is normal. The right ventricular size is moderately enlarged. There is normal pulmonary artery systolic pressure.  3. Right atrial size was mildly dilated.  4. The mitral valve is degenerative. No evidence of mitral valve regurgitation. No evidence of mitral stenosis. The mean mitral valve gradient is 3.0 mmHg with average heart rate of 66 bpm. Severe mitral annular calcification.  5. Mixed valve disease. Central aortic regurgitation, normal LV stroke volume index. The aortic valve is calcified. Aortic valve regurgitation is mild to moderate. Mild aortic valve stenosis. Aortic valve mean gradient measures 14.0 mmHg.  6. The inferior vena cava is normal  in size with greater than 50% respiratory variability, suggesting right atrial pressure of 3 mmHg. Comparison(s): Prior images reviewed side by side. Similar aortic findings. LV wall motion comparison is difficult- WMA seen best on contrast images, prior contrast images not performed. FINDINGS  Left Ventricle: Left ventricular ejection fraction, by estimation, is 65 to 70%. The left ventricle has normal function. The left ventricle demonstrates regional wall motion abnormalities. The left ventricular internal cavity size was normal in size. There is moderate concentric left ventricular hypertrophy. Left ventricular diastolic parameters are indeterminate.  LV Wall Scoring: The mid and distal anterior septum and mid inferoseptal segment are hypokinetic. Right Ventricle: The right ventricular size is moderately enlarged. No increase in right ventricular wall thickness. Right ventricular systolic function is normal. There is normal pulmonary artery systolic pressure. The tricuspid regurgitant velocity is 2.00 m/s, and with an assumed right atrial pressure of 3 mmHg, the estimated right ventricular systolic pressure is [id number]  mmHg. Left Atrium: Left atrial size was normal in size. Right Atrium: Right atrial size was mildly dilated. Pericardium: There is no evidence of pericardial effusion. Mitral Valve: The mitral valve is degenerative in appearance. Severe mitral annular calcification. No evidence of mitral valve regurgitation. No evidence of mitral valve stenosis. MV peak gradient, 9.5 mmHg. The mean mitral valve gradient is 3.0 mmHg with average heart rate of 66 bpm. Tricuspid Valve: The tricuspid valve is normal in structure. Tricuspid valve regurgitation is trivial. No evidence of tricuspid stenosis. Aortic Valve: Mixed valve disease. Central aortic regurgitation, normal LV stroke volume index. The aortic valve is calcified. Aortic valve regurgitation is mild to moderate. Aortic regurgitation PHT measures 651 msec. Mild aortic stenosis is present. Aortic valve mean gradient measures 14.0 mmHg. Aortic valve peak gradient measures 21.9 mmHg. Aortic valve area, by VTI measures 2.18 cm. Pulmonic Valve: The pulmonic valve was not well visualized. Pulmonic valve regurgitation is not visualized. Aorta: The aortic root and ascending aorta are structurally normal, with no evidence of dilitation. Venous: The inferior vena cava is normal in size with greater than 50% respiratory variability, suggesting right atrial pressure of 3 mmHg. IAS/Shunts: No atrial level shunt detected by color flow Doppler.  LEFT VENTRICLE PLAX 2D LVIDd:         4.10 cm      Diastology LVIDs:         2.70 cm      LV e' medial:    5.11 cm/s LV PW:         1.30 cm      LV E/e' medial:  19.4 LV IVS:        1.15 cm      LV e' lateral:   7.40 cm/s LVOT diam:     2.10 cm      LV E/e' lateral: 13.4 LV SV:         108 LV SV Index:   57 LVOT Area:     3.46 cm  LV Volumes (MOD) LV vol d, MOD A2C: 97.7 ml LV vol d, MOD A4C: 134.0 ml LV vol s, MOD A2C: 26.3 ml LV vol s, MOD A4C: 48.9 ml LV SV MOD A2C:     71.4 ml LV SV MOD A4C:     134.0 ml LV SV MOD BP:      77.7 ml RIGHT  VENTRICLE             IVC RV S prime:     13.70 cm/s  IVC diam: 1.80 cm  TAPSE (M-mode): 2.0 cm LEFT ATRIUM             Index        RIGHT ATRIUM           Index LA diam:        3.90 cm 2.05 cm/m   RA Area:     21.00 cm LA Vol (A2C):   65.0 ml 34.15 ml/m  RA Volume:   66.00 ml  34.68 ml/m LA Vol (A4C):   53.2 ml 27.95 ml/m LA Biplane Vol: 61.3 ml 32.21 ml/m  AORTIC VALVE AV Area (Vmax):    2.06 cm AV Area (Vmean):   1.85 cm AV Area (VTI):     2.18 cm AV Vmax:           234.00 cm/s AV Vmean:          181.000 cm/s AV VTI:            0.495 m AV Peak Grad:      21.9 mmHg AV Mean Grad:      14.0 mmHg LVOT Vmax:         139.00 cm/s LVOT Vmean:        96.800 cm/s LVOT VTI:          0.312 m LVOT/AV VTI ratio: 0.63 AI PHT:            651 msec  AORTA Ao Root diam: 3.20 cm Ao Asc diam:  3.60 cm MITRAL VALVE                TRICUSPID VALVE MV Area (PHT): 2.76 cm     TR Peak grad:   16.0 mmHg MV Area VTI:   2.22 cm     TR Vmax:        200.00 cm/s MV Peak grad:  9.5 mmHg MV Mean grad:  3.0 mmHg     SHUNTS MV Vmax:       1.54 m/s     Systemic VTI:  0.31 m MV Vmean:      88.1 cm/s    Systemic Diam: 2.10 cm MV Decel Time: 275 msec MV E velocity: 99.00 cm/s MV A velocity: 127.00 cm/s MV E/A ratio:  0.78 Rudean Haskell MD Electronically signed by Rudean Haskell MD Signature Date/Time: 09/08/2022/1:09:21 PM    Final    CARDIAC CATHETERIZATION  Result Date: 09/07/2022   Ost RCA lesion is 80% stenosed.   Prox RCA lesion is 70% stenosed.   Mid LM to Dist LM lesion is 50% stenosed.   Dist LM to Ost LAD lesion is 50% stenosed.   Ost Cx lesion is 40% stenosed.   Prox Cx lesion is 60% stenosed.   Mid LAD lesion is 60% stenosed.   A stent was successfully placed.   Post intervention, there is a 0% residual stenosis.   Post intervention, there is a 0% residual stenosis. 1.  Thrombotic occlusion of right coronary artery treated with 4 overlapping drug-eluting stents. 2.  Distal left main, ostial LAD, and ostial left  circumflex disease. 3.  LVEDP of 11 mmHg. Recommendation: The results were discussed with Dr. Ellyn Hack.  The patient will be admitted to the hospital, medical therapy will be pursued as well as an echocardiogram.  Will follow-up as an outpatient regarding treatment for residual disease.     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 He presented with innumerable and bilateral pulmonary nodules in addition to right hilar and mediastinal lymphadenopathy and metastatic disease to several areas of the bone diagnosed in January 2024.  2) prostate adenocarcinoma diagnosed in February 2024 currently managed by Dr. Junious Silk    The patient is currently undergoing palliative systemic chemotherapy with carboplatin for an AUC of 5, etoposide 100 mg/m on days 1, 2, and 3, and Imfinzi 1500 mg IV with Neulasta support. He underwent his first cycle on 08/11/22. His dose was reduced to carboplatin for an AUC of 4, etoposide 80 mg/m2 and imfinzi 1500 starting from cycle #2 due to intolerance/pancytopenia.   He was recently diagnosed with acute STEMI and currently on treatment with aspirin, Brilinta as well as metoprolol. The patient was also diagnosed with Enterococcus bacteremia during his hospitalization  The patient was seen with Dr. Julien Nordmann today. Labs were reviewed. Dr. Julien Nordmann recommends ***.   He will *** with cycle #3 today as scheduled.   Weekly labs  F/U  Supportive care  Will use peripheral IV for the next cycle of his treatment and then consider placing a Port-A-Cath before starting cycle #4. ***???  2) prostate adenocarcinoma diagnosed in February 2024 currently managed by Dr. Junious Silk The patient was advised to call immediately if he has any other concerning symptoms in the interval. The patient voices  understanding of current disease status and treatment options and is in agreement with the current care plan.    No orders of the defined types were placed in this encounter.    I spent {CHL ONC TIME VISIT - [id number] counseling the patient face to face. The total time spent in the appointment was {CHL ONC TIME VISIT - [id number].  Cadyn Rodger L Malin Sambrano, PA-C 09/27/22

## 2022-09-28 ENCOUNTER — Encounter: Payer: Self-pay | Admitting: Family Medicine

## 2022-09-28 ENCOUNTER — Other Ambulatory Visit: Payer: Medicare Other

## 2022-09-28 ENCOUNTER — Inpatient Hospital Stay: Payer: Medicare Other

## 2022-09-28 ENCOUNTER — Other Ambulatory Visit: Payer: Self-pay

## 2022-09-28 ENCOUNTER — Inpatient Hospital Stay (HOSPITAL_BASED_OUTPATIENT_CLINIC_OR_DEPARTMENT_OTHER): Payer: Medicare Other | Admitting: Physician Assistant

## 2022-09-28 ENCOUNTER — Inpatient Hospital Stay: Payer: Medicare Other | Attending: Nurse Practitioner

## 2022-09-28 ENCOUNTER — Ambulatory Visit (INDEPENDENT_AMBULATORY_CARE_PROVIDER_SITE_OTHER): Payer: Medicare Other | Admitting: Family Medicine

## 2022-09-28 VITALS — BP 118/61 | HR 69 | Temp 97.5°F | Resp 18 | Wt 174.0 lb

## 2022-09-28 VITALS — BP 120/67 | HR 70 | Temp 98.1°F

## 2022-09-28 VITALS — BP 116/62 | HR 67 | Temp 97.2°F | Ht 69.0 in | Wt 174.0 lb

## 2022-09-28 DIAGNOSIS — Z79899 Other long term (current) drug therapy: Secondary | ICD-10-CM | POA: Diagnosis not present

## 2022-09-28 DIAGNOSIS — I6523 Occlusion and stenosis of bilateral carotid arteries: Secondary | ICD-10-CM | POA: Diagnosis not present

## 2022-09-28 DIAGNOSIS — C61 Malignant neoplasm of prostate: Secondary | ICD-10-CM | POA: Diagnosis present

## 2022-09-28 DIAGNOSIS — Z5112 Encounter for antineoplastic immunotherapy: Secondary | ICD-10-CM | POA: Insufficient documentation

## 2022-09-28 DIAGNOSIS — I2119 ST elevation (STEMI) myocardial infarction involving other coronary artery of inferior wall: Secondary | ICD-10-CM | POA: Diagnosis not present

## 2022-09-28 DIAGNOSIS — Z5111 Encounter for antineoplastic chemotherapy: Secondary | ICD-10-CM

## 2022-09-28 DIAGNOSIS — B952 Enterococcus as the cause of diseases classified elsewhere: Secondary | ICD-10-CM

## 2022-09-28 DIAGNOSIS — E663 Overweight: Secondary | ICD-10-CM

## 2022-09-28 DIAGNOSIS — I2510 Atherosclerotic heart disease of native coronary artery without angina pectoris: Secondary | ICD-10-CM | POA: Diagnosis not present

## 2022-09-28 DIAGNOSIS — J90 Pleural effusion, not elsewhere classified: Secondary | ICD-10-CM | POA: Insufficient documentation

## 2022-09-28 DIAGNOSIS — C7A1 Malignant poorly differentiated neuroendocrine tumors: Secondary | ICD-10-CM

## 2022-09-28 DIAGNOSIS — R7881 Bacteremia: Secondary | ICD-10-CM | POA: Diagnosis not present

## 2022-09-28 DIAGNOSIS — E785 Hyperlipidemia, unspecified: Secondary | ICD-10-CM | POA: Diagnosis not present

## 2022-09-28 DIAGNOSIS — C7951 Secondary malignant neoplasm of bone: Secondary | ICD-10-CM | POA: Insufficient documentation

## 2022-09-28 LAB — CBC WITH DIFFERENTIAL (CANCER CENTER ONLY)
Abs Immature Granulocytes: 0.02 10*3/uL (ref 0.00–0.07)
Basophils Absolute: 0.1 10*3/uL (ref 0.0–0.1)
Basophils Relative: 1 %
Eosinophils Absolute: 0.1 10*3/uL (ref 0.0–0.5)
Eosinophils Relative: 1 %
HCT: 27.9 % — ABNORMAL LOW (ref 39.0–52.0)
Hemoglobin: 9.3 g/dL — ABNORMAL LOW (ref 13.0–17.0)
Immature Granulocytes: 0 %
Lymphocytes Relative: 23 %
Lymphs Abs: 2 10*3/uL (ref 0.7–4.0)
MCH: 33.5 pg (ref 26.0–34.0)
MCHC: 33.3 g/dL (ref 30.0–36.0)
MCV: 100.4 fL — ABNORMAL HIGH (ref 80.0–100.0)
Monocytes Absolute: 0.8 10*3/uL (ref 0.1–1.0)
Monocytes Relative: 9 %
Neutro Abs: 5.8 10*3/uL (ref 1.7–7.7)
Neutrophils Relative %: 66 %
Platelet Count: 191 10*3/uL (ref 150–400)
RBC: 2.78 MIL/uL — ABNORMAL LOW (ref 4.22–5.81)
RDW: 15.8 % — ABNORMAL HIGH (ref 11.5–15.5)
WBC Count: 8.7 10*3/uL (ref 4.0–10.5)
nRBC: 0 % (ref 0.0–0.2)

## 2022-09-28 LAB — CMP (CANCER CENTER ONLY)
ALT: 22 U/L (ref 0–44)
AST: 23 U/L (ref 15–41)
Albumin: 3.4 g/dL — ABNORMAL LOW (ref 3.5–5.0)
Alkaline Phosphatase: 131 U/L — ABNORMAL HIGH (ref 38–126)
Anion gap: 7 (ref 5–15)
BUN: 23 mg/dL (ref 8–23)
CO2: 25 mmol/L (ref 22–32)
Calcium: 9.2 mg/dL (ref 8.9–10.3)
Chloride: 107 mmol/L (ref 98–111)
Creatinine: 0.98 mg/dL (ref 0.61–1.24)
GFR, Estimated: >60 mL/min (ref >60)
Glucose, Bld: 123 mg/dL — ABNORMAL HIGH (ref 70–99)
Potassium: 4.1 mmol/L (ref 3.5–5.1)
Sodium: 139 mmol/L (ref 135–145)
Total Bilirubin: 0.4 mg/dL (ref 0.3–1.2)
Total Protein: 7 g/dL (ref 6.5–8.1)

## 2022-09-28 LAB — TSH: TSH: 0.999 u[IU]/mL (ref 0.350–4.500)

## 2022-09-28 MED ORDER — SODIUM CHLORIDE 0.9 % IV SOLN
10.0000 mg | Freq: Once | INTRAVENOUS | Status: AC
  Administered 2022-09-28: 10 mg via INTRAVENOUS
  Filled 2022-09-28: qty 10
  Filled 2022-09-28: qty 1

## 2022-09-28 MED ORDER — SODIUM CHLORIDE 0.9 % IV SOLN: Freq: Once | INTRAVENOUS | Status: AC

## 2022-09-28 MED ORDER — PALONOSETRON HCL INJECTION 0.25 MG/5ML
0.2500 mg | Freq: Once | INTRAVENOUS | Status: AC
  Administered 2022-09-28: 0.25 mg via INTRAVENOUS
  Filled 2022-09-28: qty 5

## 2022-09-28 MED ORDER — SODIUM CHLORIDE 0.9 % IV SOLN
1500.0000 mg | Freq: Once | INTRAVENOUS | Status: AC
  Administered 2022-09-28: 1500 mg via INTRAVENOUS
  Filled 2022-09-28: qty 30

## 2022-09-28 MED ORDER — SODIUM CHLORIDE 0.9 % IV SOLN
391.6000 mg | Freq: Once | INTRAVENOUS | Status: AC
  Administered 2022-09-28: 390 mg via INTRAVENOUS
  Filled 2022-09-28: qty 39

## 2022-09-28 MED ORDER — SODIUM CHLORIDE 0.9 % IV SOLN
80.0000 mg/m2 | Freq: Once | INTRAVENOUS | Status: AC
  Administered 2022-09-28: 158 mg via INTRAVENOUS
  Filled 2022-09-28: qty 7.9

## 2022-09-28 MED FILL — Dexamethasone Sodium Phosphate Inj 100 MG/10ML: INTRAMUSCULAR | Qty: 1 | Status: AC

## 2022-09-28 NOTE — Progress Notes (Unsigned)
Patient ID: ETHON CAMPBEL, male    DOB: [birth year], 77 y.o.   MRN: [id number]  This visit was conducted in person.  BP 116/62   Pulse 67   Temp (!) 97.2 F (36.2 C) (Temporal)   Ht 5\' 9"  (1.753 m)   Wt 174 lb (78.9 kg)   SpO2 97%   BMI 25.70 kg/m    CC: hosp f/u visit  Subjective:   HPI: IGNATIUS KIFER is a 77 y.o. male presenting on 09/28/2022 for Hospitalization Follow-up   Pau has a past medical history of Allergy, Arthritis, BCC (basal cell carcinoma), arm, right (09/2019), BCC (basal cell carcinoma), face (2014), BCC (basal cell carcinoma), face (02/2018), BPH (benign prostatic hypertrophy), Carotid stenosis (05/2015), Cataract, FHx: colon cancer, FHx: prostate cancer, Heart murmur, History of kidney stones, Hyperlipidemia, and Occlusion of right vertebral artery (05/2015).  Recent hospitalization for acute inferior wall STEMI s/p catheterization (distal occlusion of RCA) and stent placement (DES x4), also noted non-stented mid LAD stenosis. MI presented with intense chest pain. Hospitalization complicated by urosepsis with Enterococcus faecalis bacteremia - seen by ID, treated with IV ampicillin, transitioned to oral amoxicillin 1gm TID x 9 days which he has completed. Urinary retention treated with foley catheter and flomax - catheter was removed prior to discharge. Port was removed by IR on 09/10/2022.  Rec aspirin + brilinta x12 months, continue BB metoprolol. Rec f/u stress PET scan in 1 month Ellyn Hack). Also referred to cardiac rehab.  Hospital records reviewed. Med rec performed.   Since home, still feels drained but energy level overall improving.  No fevers/chills, chest pain. Notes ongoing exertional dyspnea.  Notes some nocturia - with overnight accidents - uses bed pad and depends.   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 Receiving palliative chemotherapy withy carboplatin and Imfinzi, etoposide, neulasta support on day 5 - currently on hold as per above with plan to restart this week. Received firmagon through urology, now on Eligard Q86mo.   Saw cardiology last week - noted dyspnea on Brilinta but needs to continue this - to discuss possible chemo interaction with onc.   Home health not set up.  Other follow up appointments scheduled: oncology Cassandra Heilingoetter NP later today  ______________________________________________________________________ Hospital admission: 09/07/2022 Hospital discharge: 09/16/2022 TCM f/u phone call:  not performed   Outpatient follow up: Follow up with your primary care provider in one week.  Check CBC, BMP, magnesium in the next visit Follow-up with infectious disease as scheduled. Cardiology has recommended follow-up in 1 month for stress PET scan.  Discharge diagnosis: Principal Problem:   Enterococcal bacteremia Active Problems:   Hyperlipidemia with target LDL less than 70   Prediabetes   Cerebrovascular disease   Indwelling urinary catheter present   Acute ST elevation myocardial infarction (STEMI) of inferior wall (HCC)   Coronary artery disease involving native coronary artery of native heart with unstable angina pectoris (HCC)   Left main coronary artery disease   Hematuria   Fever   S/P coronary artery stent placement   Port or reservoir infection   Catheter-associated urinary tract infection ([place])   Consults: ID, oncology, urology, cardiology Discharge Condition: Improved. Diet recommendation: Low sodium, heart healthy.   Wound care: None. Code status: Full.     Relevant past medical, surgical, family and social history reviewed and updated  as indicated. Interim medical history since our last visit reviewed. Allergies and medications reviewed and updated. Outpatient  Medications Prior to Visit  Medication Sig Dispense Refill   acetaminophen (TYLENOL) 500 MG tablet Take 1,000 mg by mouth every 6 (six) hours as needed for mild pain or headache.     aspirin 81 MG chewable tablet Chew 1 tablet (81 mg total) by mouth daily. 30 tablet 11   fluticasone (FLONASE) 50 MCG/ACT nasal spray Place 1 spray into both nostrils daily as needed for allergies or rhinitis.     lidocaine-prilocaine (EMLA) cream Apply to the Port-A-Cath site 30-60 minutes before chemotherapy treatment (Patient taking differently: Apply 1 Application topically See admin instructions. Apply to the Port-A-Cath site 30-60 minutes before chemotherapy treatment) 30 g 0   metoprolol tartrate (LOPRESSOR) 25 MG tablet Take 0.5 tablets (12.5 mg total) by mouth 2 (two) times daily. 30 tablet 11   Multiple Vitamin (MULTIVITAMIN) tablet Take 1 tablet by mouth daily with breakfast.     rosuvastatin (CRESTOR) 20 MG tablet TAKE 1 TABLET BY MOUTH EVERY DAY (Patient taking differently: Take 20 mg by mouth in the morning.) 90 tablet 1   SYSTANE ULTRA PF 0.4-0.3 % SOLN Place 1 drop into both eyes 3 (three) times daily as needed (for dryness/irritation).     tamsulosin (FLOMAX) 0.4 MG CAPS capsule Take 0.4 mg by mouth 2 (two) times daily.     ticagrelor (BRILINTA) 90 MG TABS tablet Take 1 tablet (90 mg total) by mouth 2 (two) times daily. 60 tablet 11   Zinc 25 MG TABS Take 25 mg by mouth daily.     prochlorperazine (COMPAZINE) 10 MG tablet Take 1 tablet (10 mg total) by mouth every 6 (six) hours as needed for nausea or vomiting. (Patient not taking: Reported on 09/28/2022) 30 tablet 0   No facility-administered medications prior to visit.     Per HPI unless specifically indicated in ROS section below Review of Systems  Objective:  BP 116/62   Pulse 67   Temp (!) 97.2 F (36.2 C) (Temporal)   Ht 5\' 9"  (1.753 m)   Wt 174 lb (78.9 kg)   SpO2 97%   BMI 25.70 kg/m   Wt Readings from Last 3 Encounters:  09/28/22  174 lb (78.9 kg)  09/28/22 174 lb (78.9 kg)  09/25/22 176 lb (79.8 kg)      Physical Exam Vitals and nursing note reviewed.  Constitutional:      Appearance: Normal appearance. He is not ill-appearing.  HENT:     Head: Normocephalic and atraumatic.     Mouth/Throat:     Mouth: Mucous membranes are moist.     Pharynx: Oropharynx is clear. No oropharyngeal exudate or posterior oropharyngeal erythema.  Eyes:     Extraocular Movements: Extraocular movements intact.     Conjunctiva/sclera: Conjunctivae normal.     Pupils: Pupils are equal, round, and reactive to light.  Neck:     Thyroid: No thyroid mass or thyromegaly.  Cardiovascular:     Rate and Rhythm: Normal rate and regular rhythm.     Pulses: Normal pulses.     Heart sounds: Murmur (3/6 systolic USB) heard.  Pulmonary:     Effort: Pulmonary effort is normal. No respiratory distress.     Breath sounds: Normal breath sounds. No wheezing, rhonchi or rales.  Abdominal:     General: Bowel sounds are normal. There is no distension.     Palpations: Abdomen is soft. There is no mass.  Tenderness: There is no abdominal tenderness. There is no guarding or rebound.     Hernia: No hernia is present.  Musculoskeletal:     Cervical back: Normal range of motion and neck supple.     Right lower leg: No edema.     Left lower leg: No edema.  Skin:    General: Skin is warm and dry.     Findings: No rash.  Neurological:     Mental Status: He is alert.  Psychiatric:        Mood and Affect: Mood normal.        Behavior: Behavior normal.       Results for orders placed or performed in visit on 09/21/22  TSH  Result Value Ref Range   TSH 1.229 0.350 - 4.500 uIU/mL  T4  Result Value Ref Range   T4, Total 6.8 4.5 - 12.0 ug/dL  CMP ([facility] only)  Result Value Ref Range   Sodium 138 135 - 145 mmol/L   Potassium 4.1 3.5 - 5.1 mmol/L   Chloride 108 98 - 111 mmol/L   CO2 24 22 - 32 mmol/L   Glucose, Bld 141 (H) 70 - 99 mg/dL    BUN 23 8 - 23 mg/dL   Creatinine 1.05 0.61 - 1.24 mg/dL   Calcium 8.9 8.9 - 10.3 mg/dL   Total Protein 6.6 6.5 - 8.1 g/dL   Albumin 3.3 (L) 3.5 - 5.0 g/dL   AST 26 15 - 41 U/L   ALT 33 0 - 44 U/L   Alkaline Phosphatase 148 (H) 38 - 126 U/L   Total Bilirubin 0.4 0.3 - 1.2 mg/dL   GFR, Estimated >60 >60 mL/min   Anion gap 6 5 - 15  CBC with Differential (Cancer Center Only)  Result Value Ref Range   WBC Count 12.7 (H) 4.0 - 10.5 K/uL   RBC 2.81 (L) 4.22 - 5.81 MIL/uL   Hemoglobin 9.5 (L) 13.0 - 17.0 g/dL   HCT 27.8 (L) 39.0 - 52.0 %   MCV 98.9 80.0 - 100.0 fL   MCH 33.8 26.0 - 34.0 pg   MCHC 34.2 30.0 - 36.0 g/dL   RDW 15.8 (H) 11.5 - 15.5 %   Platelet Count 309 150 - 400 K/uL   nRBC 0.0 0.0 - 0.2 %   Neutrophils Relative % 80 %   Neutro Abs 10.3 (H) 1.7 - 7.7 K/uL   Lymphocytes Relative 10 %   Lymphs Abs 1.3 0.7 - 4.0 K/uL   Monocytes Relative 7 %   Monocytes Absolute 0.9 0.1 - 1.0 K/uL   Eosinophils Relative 1 %   Eosinophils Absolute 0.1 0.0 - 0.5 K/uL   Basophils Relative 1 %   Basophils Absolute 0.1 0.0 - 0.1 K/uL   Immature Granulocytes 1 %   Abs Immature Granulocytes 0.09 (H) 0.00 - 0.07 K/uL   Lab Results  Component Value Date   PSA1 16.3 (H) 08/11/2022   PSA 1.59 09/23/2021   PSA 1.18 09/09/2020   PSA 1.20 07/14/2019   Assessment & Plan:   Problem List Items Addressed This Visit     Hyperlipidemia with target LDL less than 70 (Chronic)    Chronic on crestor 20mg  daily.       Acute ST elevation myocardial infarction (STEMI) of inferior wall (Chronic)    Recent hospitalization for this s/p placement of 4 drug eluting stents with plan to continue aspirin + Brilinta x 12 months. He also continues low dose metoprolol  and crestor.       Left main coronary artery disease (Chronic)    Planned f/u stress PET scan in 1 month by cardiology       Overweight with body mass index (BMI) 25.0-29.9    10 lb weight loss since cancer diangosis      Large cell  neuroendocrine carcinoma - Primary    Unsure primary lung vs prostate, with spread to lungs, prostate, bone.  Appreciate urology and oncology care.  Planning to restart chemotherapy this week after a few week break.  He will check with oncology about possible brilinta and chemo interaction.  Support provided.       Prostate cancer metastatic to multiple sites   Enterococcal bacteremia    Presumed from UTI source, but port was also removed during hospitalization.  Completed course of amoxicillin.         No orders of the defined types were placed in this encounter.   No orders of the defined types were placed in this encounter.   Patient Instructions  Good to see you today.  Touch base with oncology about possible interactions of chemo treatment with Brilinta.  Return to see me in 6 months, sooner if needed.   Follow up plan: Return in about 6 months (around 03/30/2023) for follow up visit.  Ria Bush, MD

## 2022-09-28 NOTE — Patient Instructions (Addendum)
Good to see you today.  Touch base with oncology about possible interactions of chemo treatment with Brilinta.  Return to see me in 6 months, sooner if needed.

## 2022-09-28 NOTE — Progress Notes (Signed)
Patient seen by Cassie Heilingoetter, PA-C  Vitals are within treatment parameters.  Labs reviewed: and are within treatment parameters.  Per physician team, patient is ready for treatment and there are NO modifications to the treatment plan.  

## 2022-09-28 NOTE — Patient Instructions (Signed)
[facility]  Discharge Instructions: Thank you for choosing [facility] to provide your oncology and hematology care.   If you have a lab appointment with the Cancer Center, please go directly to the Cancer Center and check in at the registration area.   Wear comfortable clothing and clothing appropriate for easy access to any Portacath or PICC line.   We strive to give you quality time with your provider. You may need to reschedule your appointment if you arrive late (15 or more minutes).  Arriving late affects you and other patients whose appointments are after yours.  Also, if you miss three or more appointments without notifying the office, you may be dismissed from the clinic at the provider's discretion.      For prescription refill requests, have your pharmacy contact our office and allow 72 hours for refills to be completed.    Today you received the following chemotherapy and/or immunotherapy agents Imfinzi, Carboplatin, Etoposide      To help prevent nausea and vomiting after your treatment, we encourage you to take your nausea medication as directed.  BELOW ARE SYMPTOMS THAT SHOULD BE REPORTED IMMEDIATELY: *FEVER GREATER THAN 100.4 F (38 C) OR HIGHER *CHILLS OR SWEATING *NAUSEA AND VOMITING THAT IS NOT CONTROLLED WITH YOUR NAUSEA MEDICATION *UNUSUAL SHORTNESS OF BREATH *UNUSUAL BRUISING OR BLEEDING *URINARY PROBLEMS (pain or burning when urinating, or frequent urination) *BOWEL PROBLEMS (unusual diarrhea, constipation, pain near the anus) TENDERNESS IN MOUTH AND THROAT WITH OR WITHOUT PRESENCE OF ULCERS (sore throat, sores in mouth, or a toothache) UNUSUAL RASH, SWELLING OR PAIN  UNUSUAL VAGINAL DISCHARGE OR ITCHING   Items with * indicate a potential emergency and should be followed up as soon as possible or go to the Emergency Department if any problems should occur.  Please show the CHEMOTHERAPY ALERT CARD or  IMMUNOTHERAPY ALERT CARD at check-in to the Emergency Department and triage nurse.  Should you have questions after your visit or need to cancel or reschedule your appointment, please contact [facility]  Dept: [phone]  and follow the prompts.  Office hours are 8:00 a.m. to 4:30 p.m. Monday - Friday. Please note that voicemails left after 4:00 p.m. may not be returned until the following business day.  We are closed weekends and major holidays. You have access to a nurse at all times for urgent questions. Please call the main number to the clinic Dept: [phone] and follow the prompts.   For any non-urgent questions, you may also contact your provider using MyChart. We now offer e-Visits for anyone 18 and older to request care online for non-urgent symptoms. For details visit [email or web address].   Also download the MyChart app! Go to the app store, search "MyChart", open the app, select [facility], and log in with your MyChart username and password.   

## 2022-09-29 ENCOUNTER — Inpatient Hospital Stay: Payer: Medicare Other

## 2022-09-29 ENCOUNTER — Encounter: Payer: Self-pay | Admitting: Family Medicine

## 2022-09-29 ENCOUNTER — Other Ambulatory Visit: Payer: Self-pay

## 2022-09-29 ENCOUNTER — Telehealth: Payer: Self-pay | Admitting: Family Medicine

## 2022-09-29 VITALS — BP 118/68 | HR 72 | Temp 98.0°F | Resp 18

## 2022-09-29 DIAGNOSIS — Z79899 Other long term (current) drug therapy: Secondary | ICD-10-CM | POA: Diagnosis not present

## 2022-09-29 DIAGNOSIS — I6523 Occlusion and stenosis of bilateral carotid arteries: Secondary | ICD-10-CM | POA: Diagnosis not present

## 2022-09-29 DIAGNOSIS — Z5111 Encounter for antineoplastic chemotherapy: Secondary | ICD-10-CM | POA: Diagnosis not present

## 2022-09-29 DIAGNOSIS — C7951 Secondary malignant neoplasm of bone: Secondary | ICD-10-CM | POA: Diagnosis not present

## 2022-09-29 DIAGNOSIS — J90 Pleural effusion, not elsewhere classified: Secondary | ICD-10-CM | POA: Diagnosis not present

## 2022-09-29 DIAGNOSIS — C7A1 Malignant poorly differentiated neuroendocrine tumors: Secondary | ICD-10-CM

## 2022-09-29 LAB — T4: T4, Total: 6.2 ug/dL (ref 4.5–12.0)

## 2022-09-29 MED ORDER — SODIUM CHLORIDE 0.9 % IV SOLN
10.0000 mg | Freq: Once | INTRAVENOUS | Status: AC
  Administered 2022-09-29: 10 mg via INTRAVENOUS
  Filled 2022-09-29: qty 10
  Filled 2022-09-29: qty 1

## 2022-09-29 MED ORDER — SODIUM CHLORIDE 0.9 % IV SOLN
80.0000 mg/m2 | Freq: Once | INTRAVENOUS | Status: AC
  Administered 2022-09-29: 158 mg via INTRAVENOUS
  Filled 2022-09-29: qty 7.9

## 2022-09-29 MED ORDER — SODIUM CHLORIDE 0.9 % IV SOLN: Freq: Once | INTRAVENOUS | Status: AC

## 2022-09-29 MED FILL — Dexamethasone Sodium Phosphate Inj 100 MG/10ML: INTRAMUSCULAR | Qty: 1 | Status: AC

## 2022-09-29 NOTE — Assessment & Plan Note (Signed)
Chronic on crestor 20mg  daily.

## 2022-09-29 NOTE — Assessment & Plan Note (Signed)
10 lb weight loss since cancer diangosis

## 2022-09-29 NOTE — Assessment & Plan Note (Signed)
Recent hospitalization for this s/p placement of 4 drug eluting stents with plan to continue aspirin + Brilinta x 12 months. He also continues low dose metoprolol and crestor.

## 2022-09-29 NOTE — Telephone Encounter (Signed)
[facility] to schedule their annual wellness visit. Appointment made for 10/06/2022.  [facility] Direct Dial: [phone]

## 2022-09-29 NOTE — Patient Instructions (Signed)
[facility]  Discharge Instructions: Thank you for choosing [facility] to provide your oncology and hematology care.   If you have a lab appointment with the Cancer Center, please go directly to the Cancer Center and check in at the registration area.   Wear comfortable clothing and clothing appropriate for easy access to any Portacath or PICC line.   We strive to give you quality time with your provider. You may need to reschedule your appointment if you arrive late (15 or more minutes).  Arriving late affects you and other patients whose appointments are after yours.  Also, if you miss three or more appointments without notifying the office, you may be dismissed from the clinic at the provider's discretion.      For prescription refill requests, have your pharmacy contact our office and allow 72 hours for refills to be completed.    Today you received the following chemotherapy and/or immunotherapy agents Imfinzi, Carboplatin, Etoposide      To help prevent nausea and vomiting after your treatment, we encourage you to take your nausea medication as directed.  BELOW ARE SYMPTOMS THAT SHOULD BE REPORTED IMMEDIATELY: *FEVER GREATER THAN 100.4 F (38 C) OR HIGHER *CHILLS OR SWEATING *NAUSEA AND VOMITING THAT IS NOT CONTROLLED WITH YOUR NAUSEA MEDICATION *UNUSUAL SHORTNESS OF BREATH *UNUSUAL BRUISING OR BLEEDING *URINARY PROBLEMS (pain or burning when urinating, or frequent urination) *BOWEL PROBLEMS (unusual diarrhea, constipation, pain near the anus) TENDERNESS IN MOUTH AND THROAT WITH OR WITHOUT PRESENCE OF ULCERS (sore throat, sores in mouth, or a toothache) UNUSUAL RASH, SWELLING OR PAIN  UNUSUAL VAGINAL DISCHARGE OR ITCHING   Items with * indicate a potential emergency and should be followed up as soon as possible or go to the Emergency Department if any problems should occur.  Please show the CHEMOTHERAPY ALERT CARD or  IMMUNOTHERAPY ALERT CARD at check-in to the Emergency Department and triage nurse.  Should you have questions after your visit or need to cancel or reschedule your appointment, please contact [facility]  Dept: [phone]  and follow the prompts.  Office hours are 8:00 a.m. to 4:30 p.m. Monday - Friday. Please note that voicemails left after 4:00 p.m. may not be returned until the following business day.  We are closed weekends and major holidays. You have access to a nurse at all times for urgent questions. Please call the main number to the clinic Dept: [phone] and follow the prompts.   For any non-urgent questions, you may also contact your provider using MyChart. We now offer e-Visits for anyone 18 and older to request care online for non-urgent symptoms. For details visit [email or web address].   Also download the MyChart app! Go to the app store, search "MyChart", open the app, select [facility], and log in with your MyChart username and password.   

## 2022-09-29 NOTE — Assessment & Plan Note (Signed)
Planned f/u stress PET scan in 1 month by cardiology

## 2022-09-29 NOTE — Assessment & Plan Note (Signed)
Presumed from UTI source, but port was also removed during hospitalization.  Completed course of amoxicillin.

## 2022-09-29 NOTE — Assessment & Plan Note (Addendum)
Unsure primary lung vs prostate, with spread to lungs, prostate, bone.  Appreciate urology and oncology care.  Planning to restart chemotherapy this week after a few week break.  He will check with oncology about possible brilinta and chemo interaction.  Support provided.

## 2022-09-30 ENCOUNTER — Inpatient Hospital Stay: Payer: Medicare Other

## 2022-09-30 ENCOUNTER — Other Ambulatory Visit: Payer: Self-pay

## 2022-09-30 VITALS — BP 117/64 | HR 60 | Temp 98.4°F | Resp 16

## 2022-09-30 DIAGNOSIS — C7951 Secondary malignant neoplasm of bone: Secondary | ICD-10-CM | POA: Diagnosis not present

## 2022-09-30 DIAGNOSIS — C7A1 Malignant poorly differentiated neuroendocrine tumors: Secondary | ICD-10-CM

## 2022-09-30 DIAGNOSIS — Z5111 Encounter for antineoplastic chemotherapy: Secondary | ICD-10-CM | POA: Diagnosis not present

## 2022-09-30 DIAGNOSIS — Z79899 Other long term (current) drug therapy: Secondary | ICD-10-CM | POA: Diagnosis not present

## 2022-09-30 DIAGNOSIS — I6523 Occlusion and stenosis of bilateral carotid arteries: Secondary | ICD-10-CM | POA: Diagnosis not present

## 2022-09-30 DIAGNOSIS — J90 Pleural effusion, not elsewhere classified: Secondary | ICD-10-CM | POA: Diagnosis not present

## 2022-09-30 MED ORDER — SODIUM CHLORIDE 0.9 % IV SOLN
10.0000 mg | Freq: Once | INTRAVENOUS | Status: AC
  Administered 2022-09-30: 10 mg via INTRAVENOUS
  Filled 2022-09-30: qty 10

## 2022-09-30 MED ORDER — SODIUM CHLORIDE 0.9 % IV SOLN: Freq: Once | INTRAVENOUS | Status: AC

## 2022-09-30 MED ORDER — SODIUM CHLORIDE 0.9 % IV SOLN
80.0000 mg/m2 | Freq: Once | INTRAVENOUS | Status: AC
  Administered 2022-09-30: 158 mg via INTRAVENOUS
  Filled 2022-09-30: qty 7.9

## 2022-10-01 ENCOUNTER — Other Ambulatory Visit: Payer: Self-pay

## 2022-10-02 ENCOUNTER — Inpatient Hospital Stay: Payer: Medicare Other

## 2022-10-02 ENCOUNTER — Other Ambulatory Visit: Payer: Self-pay

## 2022-10-02 VITALS — BP 109/65 | HR 65 | Temp 98.5°F | Resp 16

## 2022-10-02 DIAGNOSIS — I6523 Occlusion and stenosis of bilateral carotid arteries: Secondary | ICD-10-CM | POA: Diagnosis not present

## 2022-10-02 DIAGNOSIS — C7A1 Malignant poorly differentiated neuroendocrine tumors: Secondary | ICD-10-CM

## 2022-10-02 DIAGNOSIS — J90 Pleural effusion, not elsewhere classified: Secondary | ICD-10-CM | POA: Diagnosis not present

## 2022-10-02 DIAGNOSIS — C7951 Secondary malignant neoplasm of bone: Secondary | ICD-10-CM | POA: Diagnosis not present

## 2022-10-02 DIAGNOSIS — Z5111 Encounter for antineoplastic chemotherapy: Secondary | ICD-10-CM | POA: Diagnosis not present

## 2022-10-02 DIAGNOSIS — Z79899 Other long term (current) drug therapy: Secondary | ICD-10-CM | POA: Diagnosis not present

## 2022-10-02 MED ORDER — PEGFILGRASTIM-CBQV 6 MG/0.6ML ~~LOC~~ SOSY
6.0000 mg | PREFILLED_SYRINGE | Freq: Once | SUBCUTANEOUS | Status: AC
  Administered 2022-10-02: 6 mg via SUBCUTANEOUS
  Filled 2022-10-02: qty 0.6

## 2022-10-05 ENCOUNTER — Inpatient Hospital Stay: Payer: Medicare Other

## 2022-10-05 ENCOUNTER — Telehealth: Payer: Self-pay

## 2022-10-05 ENCOUNTER — Other Ambulatory Visit: Payer: Medicare Other

## 2022-10-05 ENCOUNTER — Other Ambulatory Visit: Payer: Self-pay

## 2022-10-05 DIAGNOSIS — I6523 Occlusion and stenosis of bilateral carotid arteries: Secondary | ICD-10-CM | POA: Diagnosis not present

## 2022-10-05 DIAGNOSIS — Z79899 Other long term (current) drug therapy: Secondary | ICD-10-CM | POA: Diagnosis not present

## 2022-10-05 DIAGNOSIS — Z5111 Encounter for antineoplastic chemotherapy: Secondary | ICD-10-CM | POA: Diagnosis not present

## 2022-10-05 DIAGNOSIS — C7951 Secondary malignant neoplasm of bone: Secondary | ICD-10-CM | POA: Diagnosis not present

## 2022-10-05 DIAGNOSIS — C7A1 Malignant poorly differentiated neuroendocrine tumors: Secondary | ICD-10-CM

## 2022-10-05 DIAGNOSIS — J90 Pleural effusion, not elsewhere classified: Secondary | ICD-10-CM | POA: Diagnosis not present

## 2022-10-05 LAB — CMP (CANCER CENTER ONLY)
ALT: 21 U/L (ref 0–44)
AST: 19 U/L (ref 15–41)
Albumin: 3.5 g/dL (ref 3.5–5.0)
Alkaline Phosphatase: 147 U/L — ABNORMAL HIGH (ref 38–126)
Anion gap: 7 (ref 5–15)
BUN: 36 mg/dL — ABNORMAL HIGH (ref 8–23)
CO2: 22 mmol/L (ref 22–32)
Calcium: 9.2 mg/dL (ref 8.9–10.3)
Chloride: 105 mmol/L (ref 98–111)
Creatinine: 1 mg/dL (ref 0.61–1.24)
GFR, Estimated: >60 mL/min (ref >60)
Glucose, Bld: 147 mg/dL — ABNORMAL HIGH (ref 70–99)
Potassium: 4.1 mmol/L (ref 3.5–5.1)
Sodium: 134 mmol/L — ABNORMAL LOW (ref 135–145)
Total Bilirubin: 0.8 mg/dL (ref 0.3–1.2)
Total Protein: 6.8 g/dL (ref 6.5–8.1)

## 2022-10-05 LAB — SAMPLE TO BLOOD BANK

## 2022-10-05 LAB — CBC WITH DIFFERENTIAL (CANCER CENTER ONLY)
Abs Immature Granulocytes: 0 10*3/uL (ref 0.00–0.07)
Band Neutrophils: 6 %
Basophils Absolute: 0.1 10*3/uL (ref 0.0–0.1)
Basophils Relative: 1 %
Eosinophils Absolute: 0.3 10*3/uL (ref 0.0–0.5)
Eosinophils Relative: 3 %
HCT: 25.7 % — ABNORMAL LOW (ref 39.0–52.0)
Hemoglobin: 8.9 g/dL — ABNORMAL LOW (ref 13.0–17.0)
Lymphocytes Relative: 15 %
Lymphs Abs: 1.5 10*3/uL (ref 0.7–4.0)
MCH: 33.8 pg (ref 26.0–34.0)
MCHC: 34.6 g/dL (ref 30.0–36.0)
MCV: 97.7 fL (ref 80.0–100.0)
Monocytes Absolute: 0.2 10*3/uL (ref 0.1–1.0)
Monocytes Relative: 2 %
Neutro Abs: 8.1 10*3/uL — ABNORMAL HIGH (ref 1.7–7.7)
Neutrophils Relative %: 73 %
Platelet Count: 131 10*3/uL — ABNORMAL LOW (ref 150–400)
RBC: 2.63 MIL/uL — ABNORMAL LOW (ref 4.22–5.81)
RDW: 15.5 % (ref 11.5–15.5)
WBC Count: 10.2 10*3/uL (ref 4.0–10.5)
nRBC: 0 % (ref 0.0–0.2)

## 2022-10-05 NOTE — Telephone Encounter (Signed)
CRITICAL VALUE STICKER  CRITICAL VALUE: HGB 8.9  RECEIVER (on-site recipient of call): Guinn Delarosa P. LPN  DATE & TIME NOTIFIED: 10/05/22 2:47pm  MESSENGER (representative from lab): Lauren  MD NOTIFIED: Salena Saner Heilingoetter, PA-C

## 2022-10-06 ENCOUNTER — Ambulatory Visit (INDEPENDENT_AMBULATORY_CARE_PROVIDER_SITE_OTHER): Payer: Medicare Other

## 2022-10-06 VITALS — Ht 69.0 in | Wt 174.0 lb

## 2022-10-06 DIAGNOSIS — Z0000 Encounter for general adult medical examination without abnormal findings: Secondary | ICD-10-CM

## 2022-10-06 NOTE — Progress Notes (Signed)
Subjective:   Larry Kent is a 77 y.o. male who presents for Medicare Annual/Subsequent preventive examination.  I connected with  Larry Kent on 10/06/22 by a audio enabled telemedicine application and verified that I am speaking with the correct person using two identifiers.  Patient Location: Home  Provider Location: Office/Clinic  I discussed the limitations of evaluation and management by telemedicine. The patient expressed understanding and agreed to proceed.  Review of Systems     Cardiac Risk Factors include: advanced age (>38men, >19 women);dyslipidemia;male gender;sedentary lifestyle     Objective:    Today's Vitals   10/06/22 0940  Weight: 174 lb (78.9 kg)  Height:  (1.753 m)   Body mass index is 25.7 kg/m.     10/06/2022    9:48 AM 09/21/2022   11:19 AM 09/07/2022   12:18 PM 08/21/2022   11:26 PM 08/18/2022    8:30 PM 08/18/2022    3:21 PM 08/11/2022    8:07 AM  Advanced Directives  Does Patient Have a Medical Advance Directive? Yes Yes Yes Yes  Yes No  Type of Advance Directive Healthcare Power of [employer] Power of [employer] Power of Attorney   Does patient want to make changes to medical advance directive? No - Patient declined   No - Patient declined No - Patient declined    Copy of Healthcare Power of Attorney in Chart? No - copy requested        Would patient like information on creating a medical advance directive?    No - Patient declined   No - Patient declined    Current Medications (verified) Outpatient Encounter Medications as of 10/06/2022  Medication Sig   acetaminophen (TYLENOL) 500 MG tablet Take 1,000 mg by mouth every 6 (six) hours as needed for mild pain or headache.   aspirin 81 MG chewable tablet Chew 1 tablet (81 mg total) by mouth daily.   fluticasone (FLONASE) 50 MCG/ACT nasal spray Place 1 spray into both nostrils daily as needed for allergies or rhinitis.   lidocaine-prilocaine (EMLA) cream Apply to  the Port-A-Cath site 30-60 minutes before chemotherapy treatment (Patient taking differently: Apply 1 Application topically See admin instructions. Apply to the Port-A-Cath site 30-60 minutes before chemotherapy treatment)   metoprolol tartrate (LOPRESSOR) 25 MG tablet Take 0.5 tablets (12.5 mg total) by mouth 2 (two) times daily.   Multiple Vitamin (MULTIVITAMIN) tablet Take 1 tablet by mouth daily with breakfast.   prochlorperazine (COMPAZINE) 10 MG tablet Take 1 tablet (10 mg total) by mouth every 6 (six) hours as needed for nausea or vomiting.   rosuvastatin (CRESTOR) 20 MG tablet TAKE 1 TABLET BY MOUTH EVERY DAY (Patient taking differently: Take 20 mg by mouth in the morning.)   SYSTANE ULTRA PF 0.4-0.3 % SOLN Place 1 drop into both eyes 3 (three) times daily as needed (for dryness/irritation).   tamsulosin (FLOMAX) 0.4 MG CAPS capsule Take 0.4 mg by mouth 2 (two) times daily.   ticagrelor (BRILINTA) 90 MG TABS tablet Take 1 tablet (90 mg total) by mouth 2 (two) times daily.   Zinc 25 MG TABS Take 25 mg by mouth daily.   No facility-administered encounter medications on file as of 10/06/2022.    Allergies (verified) Patient has no known allergies.   History: Past Medical History:  Diagnosis Date   Allergy    Arthritis    both knees    BCC (basal cell carcinoma), arm, right 09/2019  MOHS ([facility])   BCC (basal cell carcinoma), face 2014   L preauricular s/p MOHS   BCC (basal cell carcinoma), face 02/2018   L upper lip   BPH (benign prostatic hypertrophy)    Dr. Mena Goes @ [facility]   Carotid stenosis 05/2015   RICA 40-59%, LICA 1-39%, L vertebral occlusion, rpt 1 yr   Cataract    removed both eyes    FHx: colon cancer    FHx: prostate cancer    Heart murmur    mild aortic stenosis    History of kidney stones    Hyperlipidemia    borderline- on rosuvastatin now normal    Occlusion of right vertebral artery 05/2015   Past Surgical History:  Procedure Laterality Date    BASAL CELL CARCINOMA EXCISION  05/2018   lip, 2020 x3  basal cells removed    CATARACT EXTRACTION  2008   Left   CATARACT EXTRACTION  2013   Right (Eppes)   COLONOSCOPY  ~2010   medium int hemorrhoids, o/w WNL, rpt 5 yrs given fmhx (Dr. Randa Evens)   COLONOSCOPY  08/2019   TAs, diverticulosis, rpt 3 yrs (Armbruster)   CORONARY/GRAFT ACUTE MI REVASCULARIZATION N/A 09/07/2022   Procedure: Coronary/Graft Acute MI Revascularization;  Surgeon: Orbie Pyo, MD;  Location: [facility];  Service: Cardiovascular;  Laterality: N/A;   ENDARTERECTOMY Right 12/01/2019   Procedure: RIGHT ENDARTERECTOMY CAROTID;  Surgeon: Cephus Shelling, MD;  Location: [facility] OR;  Service: Vascular;  Laterality: Right;   exercise treadmill  11/2005   WNL Eldridge Dace)   FINE NEEDLE ASPIRATION  07/28/2022   Procedure: FINE NEEDLE ASPIRATION (FNA) LINEAR;  Surgeon: Josephine Igo, DO;  Location: [facility];  Service: Pulmonary;;   HERNIA REPAIR  2008   Right   IR IMAGING GUIDED PORT INSERTION  08/11/2022   IR REMOVAL TUN ACCESS W/ PORT W/O FL MOD SED  09/10/2022   LEFT HEART CATH AND CORONARY ANGIOGRAPHY N/A 09/07/2022   Procedure: LEFT HEART CATH AND CORONARY ANGIOGRAPHY;  Surgeon: Orbie Pyo, MD;  Location: [facility];  Service: Cardiovascular;  Laterality: N/A;   MOHS SURGERY Left spring 2014   basal cell face   PATCH ANGIOPLASTY Right 12/01/2019   Procedure: PATCH ANGIOPLASTY USING Livia Snellen BIOLOGIC PATCH;  Surgeon: Cephus Shelling, MD;  Location: [facility] OR;  Service: Vascular;  Laterality: Right;   Testicular Biopsy  2003   benign, varicocele   US ECHOCARDIOGRAPHY  11/2005   aortic sclerosis, EF 55-60%, diastolic dysfunction   VIDEO BRONCHOSCOPY WITH ENDOBRONCHIAL ULTRASOUND Bilateral 07/28/2022   Procedure: VIDEO BRONCHOSCOPY WITH ENDOBRONCHIAL ULTRASOUND;  Surgeon: Josephine Igo, DO;  Location: [facility];  Service: Pulmonary;  Laterality: Bilateral;   Family History  Problem Relation  Age of Onset   Cancer Father 12       prostate   Coronary artery disease Father 63       MI   Prostate cancer Father    Cancer Paternal Grandfather 29       prostate   Prostate cancer Paternal Grandfather    Stroke Mother    Hypertension Mother    Cancer Mother 81       colon   Colon cancer Mother    Coronary artery disease Maternal Uncle    Cancer Sister 53       breast   Breast cancer Sister    Colon cancer Maternal Grandmother    Diabetes Neg Hx    Colon polyps Neg  Hx    Esophageal cancer Neg Hx    Rectal cancer Neg Hx    Stomach cancer Neg Hx    Social History   Socioeconomic History   Marital status: Married    Spouse name: Not on file   Number of children: Not on file   Years of education: Not on file   Highest education level: Not on file  Occupational History   Not on file  Tobacco Use   Smoking status: Never   Smokeless tobacco: Never  Vaping Use   Vaping Use: Never used  Substance and Sexual Activity   Alcohol use: Not Currently    Comment: rare   Drug use: No   Sexual activity: Not on file  Other Topics Concern   Not on file  Social History Narrative   Caffeine: 2-3 cups coffee/day   Lives with wife and adult son, 1 dog   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   Edu: college   Activity: golf, no regular exercise, occasionally walks with wife   Diet: good amt water, daily fruits/vegetables, fish several times a week   Social Determinants of Health   Financial Resource Strain: Low Risk  (10/06/2022)   Overall Financial Resource Strain (CARDIA)    Difficulty of Paying Living Expenses: Not hard at all  Food Insecurity: No Food Insecurity (10/06/2022)   Hunger Vital Sign    Worried About Running Out of Food in the Last Year: Never true    Ran Out of Food in the Last Year: Never true  Transportation Needs: No Transportation Needs (10/06/2022)   PRAPARE - Administrator, Civil ServiceTransportation    Lack of Transportation (Medical): No    Lack of Transportation (Non-Medical): No   Physical Activity: Inactive (10/06/2022)   Exercise Vital Sign    Days of Exercise per Week: 0 days    Minutes of Exercise per Session: 0 min  Stress: No Stress Concern Present (10/06/2022)   [employer] - Occupational Stress Questionnaire    Feeling of Stress : Not at all  Social Connections: Moderately Integrated (10/06/2022)   Social Connection and Isolation Panel [NHANES]    Frequency of Communication with Friends and Family: More than three times a week    Frequency of Social Gatherings with Friends and Family: Three times a week    Attends Religious Services: More than 4 times per year    Active Member of Clubs or Organizations: No    Attends BankerClub or Organization Meetings: Never    Marital Status: Married    Tobacco Counseling Counseling given: Not Answered   Clinical Intake:  Pre-visit preparation completed: Yes  Pain : No/denies pain  Diabetes: No  How often do you need to have someone help you when you read instructions, pamphlets, or other written materials from your doctor or pharmacy?: 1 - Never  Diabetic?No   Interpreter Needed?: No  Information entered by :: Kandis Fantasiaourtney Krishon Adkison LPN   Activities of Daily Living    10/06/2022    9:48 AM 08/22/2022    5:02 AM  In your present state of health, do you have any difficulty performing the following activities:  Hearing? 0   Vision? 0   Difficulty concentrating or making decisions? 0   Walking or climbing stairs? 0   Dressing or bathing? 0   Doing errands, shopping? 0 0  Preparing Food and eating ? N   Using the Toilet? N   In the past six months, have you accidently leaked urine? N  Do you have problems with loss of bowel control? N   Managing your Medications? N   Managing your Finances? N   Housekeeping or managing your Housekeeping? N     Patient Care Team: [name], MD as PCP - General (Family Medicine) Iran Ouch, MD as PCP - Cardiology (Cardiology) Si Gaul, MD as Consulting Physician (Oncology) Daiva Eves, Lisette Grinder, MD as Consulting Physician (Infectious Diseases) Cephus Shelling, MD as Consulting Physician (Vascular Surgery) [facility]  Indicate any recent Medical Services you may have received from other than [facility] providers in the past year (date may be approximate).     Assessment:   This is a routine wellness examination for Chandon.  Hearing/Vision screen Hearing Screening - Comments:: Denies hearing difficulties  Vision Screening - Comments:: up to date with routine eye exams with [facility]    Dietary issues and exercise activities discussed: Current Exercise Habits: The patient does not participate in regular exercise at present   Goals Addressed             This Visit's Progress    Have 3 meals a day       COMPLETED: Increase physical activity       Starting 07/06/2017, I will continue to exercise for 60 minutes 1 day per week and to play golf at least once monthly.      COMPLETED: Patient Stated       07/14/2019, I will maintain and continue medications as prescribed.       Depression Screen    10/06/2022    9:47 AM 09/28/2022    9:03 AM 09/30/2021    2:02 PM 09/16/2020    3:27 PM 12/05/2019    2:40 PM 07/14/2019    2:02 PM 07/06/2018    3:43 PM  PHQ 2/9 Scores  PHQ - 2 Score 0 0 0 0 0 0 0  PHQ- 9 Score      0 0    Fall Risk    10/06/2022    9:46 AM 09/28/2022    9:03 AM 09/30/2021    2:02 PM 09/16/2020    3:27 PM 12/05/2019    2:40 PM  Fall Risk   Falls in the past year? 0 0 0 0 0  Number falls in past yr: 0 0   0  Injury with Fall? 0 0   0  Comment     N/A- no falls reported  Risk for fall due to : No Fall Risks No Fall Risks   No Fall Risks  Follow up Falls prevention discussed;Education provided;Falls evaluation completed Falls evaluation completed   Falls prevention discussed    FALL RISK PREVENTION PERTAINING TO THE HOME:  Any stairs in or around the home? Yes  If so, are there any  without handrails? No  Home free of loose throw rugs in walkways, pet beds, electrical cords, etc? Yes  Adequate lighting in your home to reduce risk of falls? Yes   ASSISTIVE DEVICES UTILIZED TO PREVENT FALLS:  Life alert? No  Use of a cane, walker or w/c? No  Grab bars in the bathroom? Yes  Shower chair or bench in shower? No  Elevated toilet seat or a handicapped toilet? Yes   TIMED UP AND GO:  Was the test performed? No . Telephonic visit   Cognitive Function:    07/14/2019    2:03 PM 07/06/2018   10:32 AM 07/06/2017    8:55 AM  MMSE - Mini  Mental State Exam  Orientation to time 5 5 5   Orientation to Place 5 5 5   Registration 3 3 3   Attention/ Calculation 5 0 0  Recall 3 2 3   Recall-comments  unable to recall 1 of 3 words   Language- name 2 objects  0 0  Language- repeat 1 1 1   Language- follow 3 step command  3 3  Language- read & follow direction  0 0  Write a sentence  0 0  Copy design  0 0  Total score  19 20        10/06/2022    9:48 AM  6CIT Screen  What Year? 0 points  What month? 0 points  What time? 0 points  Count back from 20 0 points  Months in reverse 0 points  Repeat phrase 0 points  Total Score 0 points    Immunizations Immunization History  Administered Date(s) Administered   Fluad Quad(high Dose 65+) 07/21/2019   Influenza,inj,Quad PF,6+ Mos 05/23/2014, 06/10/2015, 07/01/2016, 07/06/2017, 07/18/2018   Moderna Sars-Covid-2 Vaccination 08/12/2019, 09/09/2019, 04/30/2020   Pneumococcal Conjugate-13 05/23/2014   Pneumococcal Polysaccharide-23 02/12/2012   Td 02/12/2012    TDAP status: Due, Education has been provided regarding the importance of this vaccine. Advised may receive this vaccine at local pharmacy or Health Dept. Aware to provide a copy of the vaccination record if obtained from local pharmacy or Health Dept. Verbalized acceptance and understanding.  Flu Vaccine status: Up to date  Pneumococcal vaccine status: Up to  date  Covid-19 vaccine status: Information provided on how to obtain vaccines.   Qualifies for Shingles Vaccine? Yes   Zostavax completed No   Shingrix Completed?: No.    Education has been provided regarding the importance of this vaccine. Patient has been advised to call insurance company to determine out of pocket expense if they have not yet received this vaccine. Advised may also receive vaccine at local pharmacy or Health Dept. Verbalized acceptance and understanding.  Screening Tests Health Maintenance  Topic Date Due   Zoster Vaccines- Shingrix (1 of 2) Never done   DTaP/Tdap/Td (2 - Tdap) 02/11/2022   COVID-19 Vaccine (4 - 2023-24 season) 10/14/2022 (Originally 02/27/2022)   INFLUENZA VACCINE  01/28/2023   Medicare Annual Wellness (AWV)  10/06/2023   Pneumonia Vaccine 91+ Years old  Completed   Hepatitis C Screening  Completed   HPV VACCINES  Aged Out   COLONOSCOPY (Pts 45-61yrs Insurance coverage will need to be confirmed)  Discontinued   Fecal DNA (Cologuard)  Discontinued    Health Maintenance  Health Maintenance Due  Topic Date Due   Zoster Vaccines- Shingrix (1 of 2) Never done   DTaP/Tdap/Td (2 - Tdap) 02/11/2022    Colorectal cancer screening: No longer required.   Lung Cancer Screening: (Low Dose CT Chest recommended if Age 73-80 years, 30 pack-year currently smoking OR have quit w/in 15years.) does not qualify.   Lung Cancer Screening Referral: n/a  Additional Screening:  Hepatitis C Screening: does qualify; Completed 05/27/15  Vision Screening: Recommended annual ophthalmology exams for early detection of glaucoma and other disorders of the eye. Is the patient up to date with their annual eye exam?  Yes  Who is the provider or what is the name of the office in which the patient attends annual eye exams? [facility]  If pt is not established with a provider, would they like to be referred to a provider to establish care? No .   Dental Screening:  Recommended  annual dental exams for proper oral hygiene  Community Resource Referral / Chronic Care Management: CRR required this visit?  No   CCM required this visit?  No      Plan:     I have personally reviewed and noted the following in the patient's chart:   Medical and social history Use of alcohol, tobacco or illicit drugs  Current medications and supplements including opioid prescriptions. Patient is not currently taking opioid prescriptions. Functional ability and status Nutritional status Physical activity Advanced directives List of other physicians Hospitalizations, surgeries, and ER visits in previous 12 months Vitals Screenings to include cognitive, depression, and falls Referrals and appointments  In addition, I have reviewed and discussed with patient certain preventive protocols, quality metrics, and best practice recommendations. A written personalized care plan for preventive services as well as general preventive health recommendations were provided to patient.     Durwin Nora, [place]   12/28/6201   Due to this being a virtual visit, the after visit summary with patients personalized plan was offered to patient via mail or my-chart. Patient would like to access on my-chart  Nurse Notes: No concerns

## 2022-10-06 NOTE — Patient Instructions (Signed)
[name] , Thank you for taking time to come for your Medicare Wellness Visit. I appreciate your ongoing commitment to your health goals. Please review the following plan we discussed and let me know if I can assist you in the future.   These are the goals we discussed:  Goals      Have 3 meals a day        This is a list of the screening recommended for you and due dates:  Health Maintenance  Topic Date Due   Zoster (Shingles) Vaccine (1 of 2) Never done   DTaP/Tdap/Td vaccine (2 - Tdap) 02/11/2022   COVID-19 Vaccine (4 - 2023-24 season) 10/14/2022*   Flu Shot  01/28/2023   Medicare Annual Wellness Visit  10/06/2023   Pneumonia Vaccine  Completed   Hepatitis C Screening: USPSTF Recommendation to screen - Ages 18-79 yo.  Completed   HPV Vaccine  Aged Out   Colon Cancer Screening  Discontinued   Cologuard (Stool DNA test)  Discontinued  *Topic was postponed. The date shown is not the original due date.    Advanced directives: Please bring a copy of your health care power of attorney and living will to the office to be added to your chart at your convenience.   Conditions/risks identified: Aim for 30 minutes of exercise or brisk walking, 6-8 glasses of water, and 5 servings of fruits and vegetables each day.   Next appointment: Follow up in one year for your annual wellness visit.   Preventive Care 7 Years and Older, Male  Preventive care refers to lifestyle choices and visits with your health care provider that can promote health and wellness. What does preventive care include? A yearly physical exam. This is also called an annual well check. Dental exams once or twice a year. Routine eye exams. Ask your health care provider how often you should have your eyes checked. Personal lifestyle choices, including: Daily care of your teeth and gums. Regular physical activity. Eating a healthy diet. Avoiding tobacco and drug use. Limiting alcohol use. Practicing safe  sex. Taking low doses of aspirin every day. Taking vitamin and mineral supplements as recommended by your health care provider. What happens during an annual well check? The services and screenings done by your health care provider during your annual well check will depend on your age, overall health, lifestyle risk factors, and family history of disease. Counseling  Your health care provider may ask you questions about your: Alcohol use. Tobacco use. Drug use. Emotional well-being. Home and relationship well-being. Sexual activity. Eating habits. History of falls. Memory and ability to understand (cognition). Work and work Astronomer. Screening  You may have the following tests or measurements: Height, weight, and BMI. Blood pressure. Lipid and cholesterol levels. These may be checked every 5 years, or more frequently if you are over 69 years old. Skin check. Lung cancer screening. You may have this screening every year starting at age 43 if you have a 30-pack-year history of smoking and currently smoke or have quit within the past 15 years. Fecal occult blood test (FOBT) of the stool. You may have this test every year starting at age 32. Flexible sigmoidoscopy or colonoscopy. You may have a sigmoidoscopy every 5 years or a colonoscopy every 10 years starting at age 15. Prostate cancer screening. Recommendations will vary depending on your family history and other risks. Hepatitis C blood test. Hepatitis B blood test. Sexually transmitted disease (STD) testing. Diabetes screening. This is done by  checking your blood sugar (glucose) after you have not eaten for a while (fasting). You may have this done every 1-3 years. Abdominal aortic aneurysm (AAA) screening. You may need this if you are a current or former smoker. Osteoporosis. You may be screened starting at age 34 if you are at high risk. Talk with your health care provider about your test results, treatment options, and if  necessary, the need for more tests. Vaccines  Your health care provider may recommend certain vaccines, such as: Influenza vaccine. This is recommended every year. Tetanus, diphtheria, and acellular pertussis (Tdap, Td) vaccine. You may need a Td booster every 10 years. Zoster vaccine. You may need this after age 61. Pneumococcal 13-valent conjugate (PCV13) vaccine. One dose is recommended after age 77. Pneumococcal polysaccharide (PPSV23) vaccine. One dose is recommended after age 19. Talk to your health care provider about which screenings and vaccines you need and how often you need them. This information is not intended to replace advice given to you by your health care provider. Make sure you discuss any questions you have with your health care provider. Document Released: 07/12/2015 Document Revised: 03/04/2016 Document Reviewed: 04/16/2015 Elsevier Interactive Patient Education  2017 [employer] Prevention in the Home Falls can cause injuries. They can happen to people of all ages. There are many things you can do to make your home safe and to help prevent falls. What can I do on the outside of my home? Regularly fix the edges of walkways and driveways and fix any cracks. Remove anything that might make you trip as you walk through a door, such as a raised step or threshold. Trim any bushes or trees on the path to your home. Use bright outdoor lighting. Clear any walking paths of anything that might make someone trip, such as rocks or tools. Regularly check to see if handrails are loose or broken. Make sure that both sides of any steps have handrails. Any raised decks and porches should have guardrails on the edges. Have any leaves, snow, or ice cleared regularly. Use sand or salt on walking paths during winter. Clean up any spills in your garage right away. This includes oil or grease spills. What can I do in the bathroom? Use night lights. Install grab bars by the toilet  and in the tub and shower. Do not use towel bars as grab bars. Use non-skid mats or decals in the tub or shower. If you need to sit down in the shower, use a plastic, non-slip stool. Keep the floor dry. Clean up any water that spills on the floor as soon as it happens. Remove soap buildup in the tub or shower regularly. Attach bath mats securely with double-sided non-slip rug tape. Do not have throw rugs and other things on the floor that can make you trip. What can I do in the bedroom? Use night lights. Make sure that you have a light by your bed that is easy to reach. Do not use any sheets or blankets that are too big for your bed. They should not hang down onto the floor. Have a firm chair that has side arms. You can use this for support while you get dressed. Do not have throw rugs and other things on the floor that can make you trip. What can I do in the kitchen? Clean up any spills right away. Avoid walking on wet floors. Keep items that you use a lot in easy-to-reach places. If you need to reach  something above you, use a strong step stool that has a grab bar. Keep electrical cords out of the way. Do not use floor polish or wax that makes floors slippery. If you must use wax, use non-skid floor wax. Do not have throw rugs and other things on the floor that can make you trip. What can I do with my stairs? Do not leave any items on the stairs. Make sure that there are handrails on both sides of the stairs and use them. Fix handrails that are broken or loose. Make sure that handrails are as long as the stairways. Check any carpeting to make sure that it is firmly attached to the stairs. Fix any carpet that is loose or worn. Avoid having throw rugs at the top or bottom of the stairs. If you do have throw rugs, attach them to the floor with carpet tape. Make sure that you have a light switch at the top of the stairs and the bottom of the stairs. If you do not have them, ask someone to add  them for you. What else can I do to help prevent falls? Wear shoes that: Do not have high heels. Have rubber bottoms. Are comfortable and fit you well. Are closed at the toe. Do not wear sandals. If you use a stepladder: Make sure that it is fully opened. Do not climb a closed stepladder. Make sure that both sides of the stepladder are locked into place. Ask someone to hold it for you, if possible. Clearly mark and make sure that you can see: Any grab bars or handrails. First and last steps. Where the edge of each step is. Use tools that help you move around (mobility aids) if they are needed. These include: Canes. Walkers. Scooters. Crutches. Turn on the lights when you go into a dark area. Replace any light bulbs as soon as they burn out. Set up your furniture so you have a clear path. Avoid moving your furniture around. If any of your floors are uneven, fix them. If there are any pets around you, be aware of where they are. Review your medicines with your doctor. Some medicines can make you feel dizzy. This can increase your chance of falling. Ask your doctor what other things that you can do to help prevent falls. This information is not intended to replace advice given to you by your health care provider. Make sure you discuss any questions you have with your health care provider. Document Released: 04/11/2009 Document Revised: 11/21/2015 Document Reviewed: 07/20/2014 Elsevier Interactive Patient Education  2017 [employer].

## 2022-10-07 ENCOUNTER — Ambulatory Visit: Payer: Medicare Other | Admitting: Nurse Practitioner

## 2022-10-07 ENCOUNTER — Other Ambulatory Visit: Payer: Self-pay | Admitting: Radiology

## 2022-10-07 ENCOUNTER — Other Ambulatory Visit: Payer: Self-pay

## 2022-10-08 ENCOUNTER — Ambulatory Visit (HOSPITAL_COMMUNITY)
Admission: RE | Admit: 2022-10-08 | Discharge: 2022-10-08 | Disposition: A | Discharge status: Home / Self Care | Payer: Medicare Other | Source: Ambulatory Visit | Attending: Physician Assistant | Admitting: Physician Assistant

## 2022-10-08 ENCOUNTER — Other Ambulatory Visit: Payer: Self-pay

## 2022-10-08 ENCOUNTER — Encounter (HOSPITAL_COMMUNITY): Payer: Self-pay

## 2022-10-08 DIAGNOSIS — Z452 Encounter for adjustment and management of vascular access device: Secondary | ICD-10-CM | POA: Diagnosis not present

## 2022-10-08 DIAGNOSIS — C7A1 Malignant poorly differentiated neuroendocrine tumors: Secondary | ICD-10-CM | POA: Insufficient documentation

## 2022-10-08 DIAGNOSIS — C3490 Malignant neoplasm of unspecified part of unspecified bronchus or lung: Secondary | ICD-10-CM | POA: Diagnosis not present

## 2022-10-08 HISTORY — PX: IR IMAGING GUIDED PORT INSERTION: IMG5740

## 2022-10-08 MED ORDER — LIDOCAINE-EPINEPHRINE 1 %-1:100000 IJ SOLN
INTRAMUSCULAR | Status: AC
  Filled 2022-10-08: qty 1

## 2022-10-08 MED ORDER — FENTANYL CITRATE (PF) 100 MCG/2ML IJ SOLN
INTRAMUSCULAR | Status: AC
  Filled 2022-10-08: qty 2

## 2022-10-08 MED ORDER — HEPARIN SOD (PORK) LOCK FLUSH 100 UNIT/ML IV SOLN
INTRAVENOUS | Status: AC
  Filled 2022-10-08: qty 5

## 2022-10-08 MED ORDER — MIDAZOLAM HCL 2 MG/2ML IJ SOLN
INTRAMUSCULAR | Status: AC
  Filled 2022-10-08: qty 4

## 2022-10-08 MED ORDER — HEPARIN SOD (PORK) LOCK FLUSH 100 UNIT/ML IV SOLN
INTRAVENOUS | Status: AC | PRN
  Administered 2022-10-08: 500 [IU] via INTRAVENOUS

## 2022-10-08 MED ORDER — CEFAZOLIN SODIUM-DEXTROSE 2-4 GM/100ML-% IV SOLN
2.0000 g | Freq: Once | INTRAVENOUS | Status: AC
  Administered 2022-10-08: 2 g via INTRAVENOUS

## 2022-10-08 MED ORDER — FENTANYL CITRATE (PF) 100 MCG/2ML IJ SOLN
INTRAMUSCULAR | Status: AC | PRN
  Administered 2022-10-08: 50 ug via INTRAVENOUS

## 2022-10-08 MED ORDER — SODIUM CHLORIDE 0.9 % IV SOLN: INTRAVENOUS | Status: DC

## 2022-10-08 MED ORDER — MIDAZOLAM HCL 2 MG/2ML IJ SOLN
INTRAMUSCULAR | Status: AC | PRN
  Administered 2022-10-08: 1 mg via INTRAVENOUS

## 2022-10-08 MED ORDER — CEFAZOLIN SODIUM-DEXTROSE 2-4 GM/100ML-% IV SOLN
INTRAVENOUS | Status: AC
  Filled 2022-10-08: qty 100

## 2022-10-08 NOTE — Discharge Instructions (Signed)

## 2022-10-08 NOTE — Procedures (Signed)
Pre Procedure Dx: Poor venous access Post Procedural Dx: Same  Successful placement of left IJ approach port-a-cath with tip at the superior caval atrial junction. The catheter is ready for immediate use.  Estimated Blood Loss: Trace  Complications: None immediate.  Jay Rudie Sermons, MD Pager #: [phone]   

## 2022-10-08 NOTE — H&P (Signed)
Chief Complaint: Patient was seen in consultation today for port placement   Referring Physician(s): Heilingoetter,Cassandra L  Supervising Physician: Gilmer Mor  Patient Status: [facility] - Out-pt  History of Present Illness: Larry Kent is a 77 y.o. male with a newly diagnosed metastatic neuroendocrine carcinoma of suspicious prostate primary versus primary lung cancer. Had port placed on 2/13. Unfortunately developed some bacteremia and  port had to be removed on 3/14. He is now here for new port to be placed PMHx, meds, labs, imaging, allergies reviewed. Feels well, no recent fevers, chills, illness. Has been NPO today as directed.    Past Medical History:  Diagnosis Date   Allergy    Arthritis    both knees    BCC (basal cell carcinoma), arm, right 09/2019   MOHS ([place])   BCC (basal cell carcinoma), face 2014   L preauricular s/p MOHS   BCC (basal cell carcinoma), face 02/2018   L upper lip   BPH (benign prostatic hypertrophy)    Dr. Mena Goes @ [facility]   Carotid stenosis 05/2015   RICA 40-59%, LICA 1-39%, L vertebral occlusion, rpt 1 yr   Cataract    removed both eyes    FHx: colon cancer    FHx: prostate cancer    Heart murmur    mild aortic stenosis    History of kidney stones    Hyperlipidemia    borderline- on rosuvastatin now normal    Occlusion of right vertebral artery 05/2015    Past Surgical History:  Procedure Laterality Date   BASAL CELL CARCINOMA EXCISION  05/2018   lip, 2020 x3  basal cells removed    CATARACT EXTRACTION  2008   Left   CATARACT EXTRACTION  2013   Right ([place])   COLONOSCOPY  ~2010   medium int hemorrhoids, o/w WNL, rpt 5 yrs given fmhx (Dr. Randa Evens)   COLONOSCOPY  08/2019   TAs, diverticulosis, rpt 3 yrs ([place])   CORONARY/GRAFT ACUTE MI REVASCULARIZATION N/A 09/07/2022   Procedure: Coronary/Graft Acute MI Revascularization;  Surgeon: Orbie Pyo, MD;  Location: [facility] INVASIVE CV LAB;  Service:  Cardiovascular;  Laterality: N/A;   ENDARTERECTOMY Right 12/01/2019   Procedure: RIGHT ENDARTERECTOMY CAROTID;  Surgeon: Cephus Shelling, MD;  Location: [facility] OR;  Service: Vascular;  Laterality: Right;   exercise treadmill  11/2005   WNL Eldridge Dace)   FINE NEEDLE ASPIRATION  07/28/2022   Procedure: FINE NEEDLE ASPIRATION (FNA) LINEAR;  Surgeon: Josephine Igo, DO;  Location: [facility] ENDOSCOPY;  Service: Pulmonary;;   HERNIA REPAIR  2008   Right   IR IMAGING GUIDED PORT INSERTION  08/11/2022   IR REMOVAL TUN ACCESS W/ PORT W/O FL MOD SED  09/10/2022   LEFT HEART CATH AND CORONARY ANGIOGRAPHY N/A 09/07/2022   Procedure: LEFT HEART CATH AND CORONARY ANGIOGRAPHY;  Surgeon: Orbie Pyo, MD;  Location: [facility] INVASIVE CV LAB;  Service: Cardiovascular;  Laterality: N/A;   MOHS SURGERY Left spring 2014   basal cell face   PATCH ANGIOPLASTY Right 12/01/2019   Procedure: PATCH ANGIOPLASTY USING Livia Snellen BIOLOGIC PATCH;  Surgeon: Cephus Shelling, MD;  Location: [facility] OR;  Service: Vascular;  Laterality: Right;   Testicular Biopsy  2003   benign, varicocele   US ECHOCARDIOGRAPHY  11/2005   aortic sclerosis, EF 55-60%, diastolic dysfunction   VIDEO BRONCHOSCOPY WITH ENDOBRONCHIAL ULTRASOUND Bilateral 07/28/2022   Procedure: VIDEO BRONCHOSCOPY WITH ENDOBRONCHIAL ULTRASOUND;  Surgeon: Josephine Igo, DO;  Location: [facility] ENDOSCOPY;  Service: Pulmonary;  Laterality: Bilateral;    Allergies: Patient has no known allergies.  Medications: Prior to Admission medications   Medication Sig Start Date End Date Taking? Authorizing Provider  acetaminophen (TYLENOL) 500 MG tablet Take 500 mg by mouth every 6 (six) hours as needed for moderate pain.    [provider]  aspirin EC 81 MG tablet Take 1 tablet (81 mg total) by mouth daily. 08/02/18   Iran Ouch, MD  finasteride (PROSCAR) 5 MG tablet Take 5 mg by mouth daily. 07/22/22   [provider]  fluticasone (FLONASE) 50 MCG/ACT nasal spray Place  1 spray into both nostrils daily as needed.    [provider]  ibuprofen (ADVIL) 200 MG tablet Take 400 mg by mouth every 6 (six) hours as needed for moderate pain.    [provider]  lidocaine-prilocaine (EMLA) cream Apply to the Port-A-Cath site 30-60 minutes before chemotherapy treatment 08/03/22   Si Gaul, MD  Multiple Vitamin (MULTIVITAMIN) tablet Take 1 tablet by mouth daily.    [provider]  prochlorperazine (COMPAZINE) 10 MG tablet Take 1 tablet (10 mg total) by mouth every 6 (six) hours as needed for nausea or vomiting. 08/03/22   Si Gaul, MD  rosuvastatin (CRESTOR) 20 MG tablet TAKE 1 TABLET BY MOUTH EVERY DAY 04/28/22   [name], MD  tamsulosin (FLOMAX) 0.4 MG CAPS capsule Take 0.4 mg by mouth 2 (two) times daily. 11/16/18   [name], MD  Zinc 25 MG TABS Take 25 mg by mouth daily.    [provider]     Family History  Problem Relation Age of Onset   Cancer Father 78       prostate   Coronary artery disease Father 38       MI   Prostate cancer Father    Cancer Paternal Grandfather 37       prostate   Prostate cancer Paternal Grandfather    Stroke Mother    Hypertension Mother    Cancer Mother 48       colon   Colon cancer Mother    Coronary artery disease Maternal Uncle    Cancer Sister 33       breast   Breast cancer Sister    Colon cancer Maternal Grandmother    Diabetes Neg Hx    Colon polyps Neg Hx    Esophageal cancer Neg Hx    Rectal cancer Neg Hx    Stomach cancer Neg Hx     Social History   Socioeconomic History   Marital status: Married    Spouse name: Not on file   Number of children: Not on file   Years of education: Not on file   Highest education level: Not on file  Occupational History   Not on file  Tobacco Use   Smoking status: Never   Smokeless tobacco: Never  Vaping Use   Vaping Use: Never used  Substance and Sexual Activity   Alcohol use: Not Currently     Comment: rare   Drug use: No   Sexual activity: Not on file  Other Topics Concern   Not on file  Social History Narrative   Caffeine: 2-3 cups coffee/day   Lives with wife and adult son, 1 dog   Occupation: Warden/ranger   Edu: college   Activity: golf, no regular exercise, occasionally walks with wife   Diet: good amt water, daily fruits/vegetables, fish several times a week   Social Determinants of  Health   Financial Resource Strain: Low Risk  (10/06/2022)   Overall Financial Resource Strain (CARDIA)    Difficulty of Paying Living Expenses: Not hard at all  Food Insecurity: No Food Insecurity (10/06/2022)   Hunger Vital Sign    Worried About Running Out of Food in the Last Year: Never true    Ran Out of Food in the Last Year: Never true  Transportation Needs: No Transportation Needs (10/06/2022)   PRAPARE - Administrator, Civil Service (Medical): No    Lack of Transportation (Non-Medical): No  Physical Activity: Inactive (10/06/2022)   Exercise Vital Sign    Days of Exercise per Week: 0 days    Minutes of Exercise per Session: 0 min  Stress: No Stress Concern Present (10/06/2022)   [employer] of Occupational Health - Occupational Stress Questionnaire    Feeling of Stress : Not at all  Social Connections: Moderately Integrated (10/06/2022)   Social Connection and Isolation Panel [NHANES]    Frequency of Communication with Friends and Family: More than three times a week    Frequency of Social Gatherings with Friends and Family: Three times a week    Attends Religious Services: More than 4 times per year    Active Member of Clubs or Organizations: No    Attends Banker Meetings: Never    Marital Status: Married    Review of Systems: A 12 point ROS discussed and pertinent positives are indicated in the HPI above.  All other systems are negative.  Review of Systems  Vital Signs: BP 127/73   Pulse 64   Temp 98.2 F (36.8 C) (Oral)   Resp  16   Ht 5\' 9"  (1.753 m)   Wt 174 lb (78.9 kg)   SpO2 100%   BMI 25.70 kg/m   Physical Exam Constitutional:      Appearance: He is not ill-appearing.  HENT:     Mouth/Throat:     Mouth: Mucous membranes are moist.     Pharynx: Oropharynx is clear.  Cardiovascular:     Rate and Rhythm: Normal rate and regular rhythm.     Heart sounds: Normal heart sounds.  Pulmonary:     Effort: Pulmonary effort is normal. No respiratory distress.     Breath sounds: Normal breath sounds.  Skin:    General: Skin is warm and dry.     Comments: (R)upper chest port removal site healing well. Small focal area of firmness c/w small hematoma. NT  Neurological:     General: No focal deficit present.     Mental Status: He is alert and oriented to person, place, and time.  Psychiatric:        Mood and Affect: Mood normal.        Thought Content: Thought content normal.        Judgment: Judgment normal.     Imaging: CT Chest W Contrast  Result Date: 09/18/2022 CLINICAL DATA:  Metastatic lung cancer restaging, recent pausing chemotherapy due to myocardial infarction * Tracking Code: BO * EXAM: CT CHEST, ABDOMEN, AND PELVIS WITH CONTRAST TECHNIQUE: Multidetector CT imaging of the chest, abdomen and pelvis was performed following the standard protocol during bolus administration of intravenous contrast. RADIATION DOSE REDUCTION: This exam was performed according to the departmental dose-optimization program which includes automated exposure control, adjustment of the mA and/or kV according to patient size and/or use of iterative reconstruction technique. CONTRAST:  OMNIPAQUE IOHEXOL 300 MG/ML  SOLN COMPARISON:  CT  abdomen pelvis, 08/18/2022, PET-CT, 07/15/2022 FINDINGS: CT CHEST FINDINGS Cardiovascular: Evidence of recently removed port catheter (series 2, image 15). Aortic atherosclerosis. Aortic valve calcifications. Normal heart size. Three-vessel coronary artery calcifications. No pericardial effusion.  Mediastinum/Nodes: Interval decrease in size of enlarged, previously FDG avid right hilar and subcarinal lymph nodes, subcarinal node measuring up to 2.0 x 1.6 cm, previously 2.9 x 1.8 cm (series 2, image 28). Thyroid gland, trachea, and esophagus demonstrate no significant findings. Lungs/Pleura: Numerous bilateral pulmonary nodules of varying sizes, the majority not significantly changed, for example in the peripheral left upper lobe measuring 1.5 x 1.4 cm (series 4, image 54). There are some however which are slightly diminished in size and fullness, for example in the right middle lobe measuring 1.6 x 1.2 cm, previously 1.9 x 1.5 cm (series 4, image 87), and other generally very small nodules which are slightly increased in size, for example a 0.5 cm nodule in the left upper lobe, previously 0.3 cm (series 4, image 58). Unchanged small left pleural effusion. Musculoskeletal: No chest wall abnormality. No acute osseous findings. CT ABDOMEN PELVIS FINDINGS Hepatobiliary: No solid liver abnormality is seen. No gallstones, gallbladder wall thickening, or biliary dilatation. Pancreas: Unremarkable. No pancreatic ductal dilatation or surrounding inflammatory changes. Spleen: Normal in size without significant abnormality. Adrenals/Urinary Tract: Adrenal glands are unremarkable. Mild right hydronephrosis and hydroureter, similar to prior examination. Interval resolution of previously seen left-sided hydronephrosis. Small nonobstructive right renal calculus. No ureteral calculi. Distended urinary bladder. Stomach/Bowel: Stomach is within normal limits. Appendix appears normal. No evidence of bowel wall thickening, distention, or inflammatory changes. Descending and sigmoid diverticulosis. Vascular/Lymphatic: Aortic atherosclerosis. No enlarged abdominal or pelvic lymph nodes. Reproductive: Mild prostatomegaly. Other: No abdominal wall hernia or abnormality. No ascites. Musculoskeletal: No acute osseous findings.  Interval increase in sclerosis of previously seen FDG avid osseous metastases, for example in posterior aspect L3 (series 2, image 69) and in the bilateral pubic rami (series 2, image 123). IMPRESSION: 1. Interval decrease in size of enlarged, previously FDG avid right hilar and subcarinal lymph nodes, consistent with treatment response. 2. Numerous bilateral pulmonary nodules of varying sizes, overall with little interval change, however some nodules slightly diminished in size, other nodules slightly enlarged, consistent with mixed response to treatment. 3. Interval post treatment sclerosis previously FDG avid osseous metastatic disease. 4. No evidence of new metastatic disease in the chest, abdomen, or pelvis. 5. Mild right hydronephrosis and hydroureter, similar to prior examination. Interval resolution of previously seen left-sided hydronephrosis. Distended urinary bladder. Correlate with 6. Nonobstructive right nephrolithiasis. 7. Coronary artery disease. 8. Aortic valve calcifications. Correlate for echocardiographic evidence of aortic valve dysfunction. Aortic Atherosclerosis (ICD10-I70.0). Electronically Signed   By: Jearld Lesch M.D.   On: 09/18/2022 22:21   CT Abdomen Pelvis W Contrast  Result Date: 09/18/2022 CLINICAL DATA:  Metastatic lung cancer restaging, recent pausing chemotherapy due to myocardial infarction * Tracking Code: BO * EXAM: CT CHEST, ABDOMEN, AND PELVIS WITH CONTRAST TECHNIQUE: Multidetector CT imaging of the chest, abdomen and pelvis was performed following the standard protocol during bolus administration of intravenous contrast. RADIATION DOSE REDUCTION: This exam was performed according to the departmental dose-optimization program which includes automated exposure control, adjustment of the mA and/or kV according to patient size and/or use of iterative reconstruction technique. CONTRAST:  OMNIPAQUE IOHEXOL 300 MG/ML  SOLN COMPARISON:  CT abdomen pelvis, 08/18/2022,  PET-CT, 07/15/2022 FINDINGS: CT CHEST FINDINGS Cardiovascular: Evidence of recently removed port catheter (series 2, image 15). Aortic atherosclerosis.  Aortic valve calcifications. Normal heart size. Three-vessel coronary artery calcifications. No pericardial effusion. Mediastinum/Nodes: Interval decrease in size of enlarged, previously FDG avid right hilar and subcarinal lymph nodes, subcarinal node measuring up to 2.0 x 1.6 cm, previously 2.9 x 1.8 cm (series 2, image 28). Thyroid gland, trachea, and esophagus demonstrate no significant findings. Lungs/Pleura: Numerous bilateral pulmonary nodules of varying sizes, the majority not significantly changed, for example in the peripheral left upper lobe measuring 1.5 x 1.4 cm (series 4, image 54). There are some however which are slightly diminished in size and fullness, for example in the right middle lobe measuring 1.6 x 1.2 cm, previously 1.9 x 1.5 cm (series 4, image 87), and other generally very small nodules which are slightly increased in size, for example a 0.5 cm nodule in the left upper lobe, previously 0.3 cm (series 4, image 58). Unchanged small left pleural effusion. Musculoskeletal: No chest wall abnormality. No acute osseous findings. CT ABDOMEN PELVIS FINDINGS Hepatobiliary: No solid liver abnormality is seen. No gallstones, gallbladder wall thickening, or biliary dilatation. Pancreas: Unremarkable. No pancreatic ductal dilatation or surrounding inflammatory changes. Spleen: Normal in size without significant abnormality. Adrenals/Urinary Tract: Adrenal glands are unremarkable. Mild right hydronephrosis and hydroureter, similar to prior examination. Interval resolution of previously seen left-sided hydronephrosis. Small nonobstructive right renal calculus. No ureteral calculi. Distended urinary bladder. Stomach/Bowel: Stomach is within normal limits. Appendix appears normal. No evidence of bowel wall thickening, distention, or inflammatory changes.  Descending and sigmoid diverticulosis. Vascular/Lymphatic: Aortic atherosclerosis. No enlarged abdominal or pelvic lymph nodes. Reproductive: Mild prostatomegaly. Other: No abdominal wall hernia or abnormality. No ascites. Musculoskeletal: No acute osseous findings. Interval increase in sclerosis of previously seen FDG avid osseous metastases, for example in posterior aspect L3 (series 2, image 69) and in the bilateral pubic rami (series 2, image 123). IMPRESSION: 1. Interval decrease in size of enlarged, previously FDG avid right hilar and subcarinal lymph nodes, consistent with treatment response. 2. Numerous bilateral pulmonary nodules of varying sizes, overall with little interval change, however some nodules slightly diminished in size, other nodules slightly enlarged, consistent with mixed response to treatment. 3. Interval post treatment sclerosis previously FDG avid osseous metastatic disease. 4. No evidence of new metastatic disease in the chest, abdomen, or pelvis. 5. Mild right hydronephrosis and hydroureter, similar to prior examination. Interval resolution of previously seen left-sided hydronephrosis. Distended urinary bladder. Correlate with 6. Nonobstructive right nephrolithiasis. 7. Coronary artery disease. 8. Aortic valve calcifications. Correlate for echocardiographic evidence of aortic valve dysfunction. Aortic Atherosclerosis (ICD10-I70.0). Electronically Signed   By: Jearld LeschAlex D Bibbey M.D.   On: 09/18/2022 22:21   IR REMOVAL TUN ACCESS W/ PORT W/O FL MOD SED  Result Date: 09/10/2022 INDICATION: 77 year old with Enterococcus bacteremia. Request for Port-A-Cath removal. EXAM: REMOVAL RIGHT IJ VEIN PORT-A-CATH MEDICATIONS: Versed 1 mg ANESTHESIA/SEDATION: Patient was monitored by a radiology nurse throughout the procedure. FLUOROSCOPY: None COMPLICATIONS: None immediate. PROCEDURE: Informed written consent was obtained from the patient after a thorough discussion of the procedural risks, benefits  and alternatives. All questions were addressed. Maximal Sterile Barrier Technique was utilized including caps, mask, sterile gowns, sterile gloves, sterile drape, hand hygiene and skin antiseptic. A timeout was performed prior to the initiation of the procedure. The right chest was prepped and draped in a sterile fashion. Lidocaine was utilized for local anesthesia. An incision was made over the previously healed surgical incision. Utilizing blunt dissection, the port catheter and reservoir were removed from the underlying subcutaneous tissue in their entirety.  The pocket was irrigated with a copious amount of sterile normal saline. The subcutaneous tissue was closed with 3-0 Vicryl interrupted subcutaneous stitches. A 4-0 Vicryl running subcuticular stitch was utilized to approximate the skin. Dermabond was applied. FINDINGS: Port was completely removed. Port pocket appeared healthy without fluid or drainage. IMPRESSION: Successful right IJ vein Port-A-Cath explant. Electronically Signed   By: Richarda Overlie M.D.   On: 09/10/2022 21:19   DG Chest Port 1 View  Result Date: 09/09/2022 CLINICAL DATA:  Tachycardia EXAM: PORTABLE CHEST 1 VIEW COMPARISON:  CXR 08/22/22 FINDINGS: No pleural effusion. No pneumothorax. Right chest port with tip near the cavoatrial junction. Compared to prior exam there are new patchy airspace opacities at the bilateral lung bases, which could represent atelectasis or infection. No radiographically apparent displaced rib fractures. Visualized upper abdomen is unremarkable. IMPRESSION: Compared to prior exam there are new patchy airspace opacities at the bilateral lung bases, which could represent atelectasis or infection. Electronically Signed   By: Lorenza Cambridge M.D.   On: 09/09/2022 09:55    Labs:  CBC: Recent Labs    09/14/22 0235 09/21/22 0958 09/28/22 1125 10/05/22 1436  WBC 6.5 12.7* 8.7 10.2  HGB 8.8* 9.5* 9.3* 8.9*  HCT 25.6* 27.8* 27.9* 25.7*  PLT 107* 309 191 131*      COAGS: Recent Labs    08/21/22 2340 09/07/22 1323  INR 1.1 1.2  APTT 38* >200*    BMP: Recent Labs    09/15/22 0200 09/21/22 0958 09/28/22 1125 10/05/22 1436  NA 133* 138 139 134*  K 3.8 4.1 4.1 4.1  CL 105 108 107 105  CO2 21* 24 25 22   GLUCOSE 106* 141* 123* 147*  BUN 15 23 23  36*  CALCIUM 8.5* 8.9 9.2 9.2  CREATININE 0.91 1.05 0.98 1.00  GFRNONAA >60 >60 >60 >60     LIVER FUNCTION TESTS: Recent Labs    09/07/22 1111 09/12/22 0121 09/21/22 0958 09/28/22 1125 10/05/22 1436  BILITOT 0.6  --  0.4 0.4 0.8  AST 39  --  26 23 19   ALT 54*  --  33 22 21  ALKPHOS 160*  --  148* 131* 147*  PROT 6.8  --  6.6 7.0 6.8  ALBUMIN 3.7 2.2* 3.3* 3.4* 3.5      Assessment and Plan: Newly diagnosed metastatic neuroendocrine carcinoma, suspicious for prostate primary versus primary lung cancer For new port placement. Risks and benefits of image guided port-a-catheter placement was discussed with the patient including, but not limited to bleeding, infection, pneumothorax, or fibrin sheath development and need for additional procedures.  All of the patient's questions were answered, patient is agreeable to proceed. Consent signed and in chart.    Electronically Signed: Brayton El, PA-C 10/08/2022, 12:56 PM   I spent a total of 20 minutes in face to face in clinical consultation, greater than 50% of which was counseling/coordinating care for port

## 2022-10-09 ENCOUNTER — Other Ambulatory Visit: Payer: Self-pay | Admitting: *Deleted

## 2022-10-09 DIAGNOSIS — I6523 Occlusion and stenosis of bilateral carotid arteries: Secondary | ICD-10-CM

## 2022-10-11 ENCOUNTER — Other Ambulatory Visit: Payer: Self-pay | Admitting: Family Medicine

## 2022-10-11 ENCOUNTER — Other Ambulatory Visit: Payer: Self-pay | Admitting: Internal Medicine

## 2022-10-12 ENCOUNTER — Inpatient Hospital Stay: Payer: Medicare Other

## 2022-10-12 ENCOUNTER — Other Ambulatory Visit: Payer: Self-pay

## 2022-10-12 ENCOUNTER — Telehealth: Payer: Self-pay

## 2022-10-12 ENCOUNTER — Encounter: Payer: Self-pay | Admitting: Internal Medicine

## 2022-10-12 ENCOUNTER — Other Ambulatory Visit (HOSPITAL_COMMUNITY): Payer: Self-pay

## 2022-10-12 DIAGNOSIS — C7951 Secondary malignant neoplasm of bone: Secondary | ICD-10-CM | POA: Diagnosis not present

## 2022-10-12 DIAGNOSIS — I6523 Occlusion and stenosis of bilateral carotid arteries: Secondary | ICD-10-CM | POA: Diagnosis not present

## 2022-10-12 DIAGNOSIS — Z79899 Other long term (current) drug therapy: Secondary | ICD-10-CM | POA: Diagnosis not present

## 2022-10-12 DIAGNOSIS — C7A1 Malignant poorly differentiated neuroendocrine tumors: Secondary | ICD-10-CM

## 2022-10-12 DIAGNOSIS — J90 Pleural effusion, not elsewhere classified: Secondary | ICD-10-CM | POA: Diagnosis not present

## 2022-10-12 DIAGNOSIS — Z5111 Encounter for antineoplastic chemotherapy: Secondary | ICD-10-CM | POA: Diagnosis not present

## 2022-10-12 LAB — CBC WITH DIFFERENTIAL (CANCER CENTER ONLY)
Abs Immature Granulocytes: 0.13 10*3/uL — ABNORMAL HIGH (ref 0.00–0.07)
Basophils Absolute: 0.1 10*3/uL (ref 0.0–0.1)
Basophils Relative: 1 %
Eosinophils Absolute: 0.1 10*3/uL (ref 0.0–0.5)
Eosinophils Relative: 2 %
HCT: 25 % — ABNORMAL LOW (ref 39.0–52.0)
Hemoglobin: 8.3 g/dL — ABNORMAL LOW (ref 13.0–17.0)
Immature Granulocytes: 1 %
Lymphocytes Relative: 19 %
Lymphs Abs: 1.7 10*3/uL (ref 0.7–4.0)
MCH: 33.7 pg (ref 26.0–34.0)
MCHC: 33.2 g/dL (ref 30.0–36.0)
MCV: 101.6 fL — ABNORMAL HIGH (ref 80.0–100.0)
Monocytes Absolute: 1 10*3/uL (ref 0.1–1.0)
Monocytes Relative: 11 %
Neutro Abs: 6.1 10*3/uL (ref 1.7–7.7)
Neutrophils Relative %: 66 %
Platelet Count: 69 10*3/uL — ABNORMAL LOW (ref 150–400)
RBC: 2.46 MIL/uL — ABNORMAL LOW (ref 4.22–5.81)
RDW: 17 % — ABNORMAL HIGH (ref 11.5–15.5)
Smear Review: NORMAL
WBC Count: 9.2 10*3/uL (ref 4.0–10.5)
nRBC: 0 % (ref 0.0–0.2)

## 2022-10-12 LAB — CMP (CANCER CENTER ONLY)
ALT: 18 U/L (ref 0–44)
AST: 19 U/L (ref 15–41)
Albumin: 3.6 g/dL (ref 3.5–5.0)
Alkaline Phosphatase: 164 U/L — ABNORMAL HIGH (ref 38–126)
Anion gap: 7 (ref 5–15)
BUN: 22 mg/dL (ref 8–23)
CO2: 26 mmol/L (ref 22–32)
Calcium: 9.4 mg/dL (ref 8.9–10.3)
Chloride: 106 mmol/L (ref 98–111)
Creatinine: 1.04 mg/dL (ref 0.61–1.24)
GFR, Estimated: >60 mL/min (ref >60)
Glucose, Bld: 137 mg/dL — ABNORMAL HIGH (ref 70–99)
Potassium: 3.8 mmol/L (ref 3.5–5.1)
Sodium: 139 mmol/L (ref 135–145)
Total Bilirubin: 0.4 mg/dL (ref 0.3–1.2)
Total Protein: 6.9 g/dL (ref 6.5–8.1)

## 2022-10-12 LAB — SAMPLE TO BLOOD BANK

## 2022-10-12 MED ORDER — PROCHLORPERAZINE MALEATE 10 MG PO TABS
10.0000 mg | ORAL_TABLET | Freq: Four times a day (QID) | ORAL | 0 refills | Status: DC | PRN
  Filled 2022-10-12 – 2022-10-23 (×2): qty 30, 8d supply, fill #0

## 2022-10-12 NOTE — Telephone Encounter (Signed)
Message from pt via pharmacy:  Preferred delivery: Pickup on 10/13/2022 at  2:30 PM   Systane Ultra soln Last OV:  09/28/22, hosp f/u Next OV:  none

## 2022-10-12 NOTE — Telephone Encounter (Signed)
Pt had MCR wellness on 10/06/22. Plz schedule CPE and fasting lab visits to prevent delays in refills.

## 2022-10-13 ENCOUNTER — Other Ambulatory Visit (HOSPITAL_COMMUNITY): Payer: Self-pay

## 2022-10-13 ENCOUNTER — Ambulatory Visit: Payer: Medicare Other | Admitting: Internal Medicine

## 2022-10-13 ENCOUNTER — Ambulatory Visit: Payer: Medicare Other

## 2022-10-13 ENCOUNTER — Other Ambulatory Visit: Payer: Medicare Other

## 2022-10-13 MED ORDER — SYSTANE ULTRA PF 0.4-0.3 % OP SOLN
1.0000 [drp] | Freq: Three times a day (TID) | OPHTHALMIC | 1 refills | Status: DC | PRN
  Filled 2022-10-13: qty 10, fill #0

## 2022-10-13 NOTE — Telephone Encounter (Signed)
Spoke to pt, scheduled cpe for 12/29/22

## 2022-10-13 NOTE — Telephone Encounter (Signed)
ERx 

## 2022-10-14 ENCOUNTER — Ambulatory Visit: Payer: Medicare Other

## 2022-10-15 ENCOUNTER — Ambulatory Visit: Payer: Medicare Other

## 2022-10-16 MED FILL — Dexamethasone Sodium Phosphate Inj 100 MG/10ML: INTRAMUSCULAR | Qty: 1 | Status: AC

## 2022-10-17 ENCOUNTER — Ambulatory Visit: Payer: Medicare Other

## 2022-10-19 ENCOUNTER — Inpatient Hospital Stay: Payer: Medicare Other

## 2022-10-19 ENCOUNTER — Inpatient Hospital Stay (HOSPITAL_BASED_OUTPATIENT_CLINIC_OR_DEPARTMENT_OTHER): Payer: Medicare Other | Admitting: Internal Medicine

## 2022-10-19 ENCOUNTER — Other Ambulatory Visit: Payer: Medicare Other

## 2022-10-19 ENCOUNTER — Inpatient Hospital Stay: Payer: Medicare Other | Admitting: Infectious Disease

## 2022-10-19 ENCOUNTER — Encounter: Payer: Self-pay | Admitting: Medical Oncology

## 2022-10-19 ENCOUNTER — Other Ambulatory Visit: Payer: Self-pay

## 2022-10-19 VITALS — BP 124/63 | HR 68

## 2022-10-19 VITALS — BP 120/64 | HR 66 | Temp 97.6°F | Resp 13 | Wt 176.6 lb

## 2022-10-19 DIAGNOSIS — I6523 Occlusion and stenosis of bilateral carotid arteries: Secondary | ICD-10-CM | POA: Diagnosis not present

## 2022-10-19 DIAGNOSIS — C7A1 Malignant poorly differentiated neuroendocrine tumors: Secondary | ICD-10-CM

## 2022-10-19 DIAGNOSIS — J90 Pleural effusion, not elsewhere classified: Secondary | ICD-10-CM | POA: Diagnosis not present

## 2022-10-19 DIAGNOSIS — C3490 Malignant neoplasm of unspecified part of unspecified bronchus or lung: Secondary | ICD-10-CM

## 2022-10-19 DIAGNOSIS — Z960 Presence of urogenital implants: Secondary | ICD-10-CM

## 2022-10-19 DIAGNOSIS — Z79899 Other long term (current) drug therapy: Secondary | ICD-10-CM | POA: Diagnosis not present

## 2022-10-19 DIAGNOSIS — C7951 Secondary malignant neoplasm of bone: Secondary | ICD-10-CM | POA: Diagnosis not present

## 2022-10-19 DIAGNOSIS — Z5111 Encounter for antineoplastic chemotherapy: Secondary | ICD-10-CM | POA: Diagnosis not present

## 2022-10-19 LAB — CMP (CANCER CENTER ONLY)
ALT: 12 U/L (ref 0–44)
AST: 18 U/L (ref 15–41)
Albumin: 3.3 g/dL — ABNORMAL LOW (ref 3.5–5.0)
Alkaline Phosphatase: 139 U/L — ABNORMAL HIGH (ref 38–126)
Anion gap: 5 (ref 5–15)
BUN: 19 mg/dL (ref 8–23)
CO2: 27 mmol/L (ref 22–32)
Calcium: 9 mg/dL (ref 8.9–10.3)
Chloride: 107 mmol/L (ref 98–111)
Creatinine: 0.89 mg/dL (ref 0.61–1.24)
GFR, Estimated: >60 mL/min (ref >60)
Glucose, Bld: 163 mg/dL — ABNORMAL HIGH (ref 70–99)
Potassium: 3.9 mmol/L (ref 3.5–5.1)
Sodium: 139 mmol/L (ref 135–145)
Total Bilirubin: 0.4 mg/dL (ref 0.3–1.2)
Total Protein: 6.4 g/dL — ABNORMAL LOW (ref 6.5–8.1)

## 2022-10-19 LAB — SAMPLE TO BLOOD BANK

## 2022-10-19 LAB — CBC WITH DIFFERENTIAL (CANCER CENTER ONLY)
Abs Immature Granulocytes: 0.1 10*3/uL — ABNORMAL HIGH (ref 0.00–0.07)
Basophils Absolute: 0.1 10*3/uL (ref 0.0–0.1)
Basophils Relative: 1 %
Eosinophils Absolute: 0.1 10*3/uL (ref 0.0–0.5)
Eosinophils Relative: 1 %
HCT: 24.8 % — ABNORMAL LOW (ref 39.0–52.0)
Hemoglobin: 8.6 g/dL — ABNORMAL LOW (ref 13.0–17.0)
Immature Granulocytes: 1 %
Lymphocytes Relative: 14 %
Lymphs Abs: 1.5 10*3/uL (ref 0.7–4.0)
MCH: 35.5 pg — ABNORMAL HIGH (ref 26.0–34.0)
MCHC: 34.7 g/dL (ref 30.0–36.0)
MCV: 102.5 fL — ABNORMAL HIGH (ref 80.0–100.0)
Monocytes Absolute: 1.1 10*3/uL — ABNORMAL HIGH (ref 0.1–1.0)
Monocytes Relative: 10 %
Neutro Abs: 7.5 10*3/uL (ref 1.7–7.7)
Neutrophils Relative %: 73 %
Platelet Count: 181 10*3/uL (ref 150–400)
RBC: 2.42 MIL/uL — ABNORMAL LOW (ref 4.22–5.81)
RDW: 18.6 % — ABNORMAL HIGH (ref 11.5–15.5)
WBC Count: 10.4 10*3/uL (ref 4.0–10.5)
nRBC: 0 % (ref 0.0–0.2)

## 2022-10-19 MED ORDER — HEPARIN SOD (PORK) LOCK FLUSH 100 UNIT/ML IV SOLN
500.0000 [IU] | Freq: Once | INTRAVENOUS | Status: AC | PRN
  Administered 2022-10-19: 500 [IU]

## 2022-10-19 MED ORDER — SODIUM CHLORIDE 0.9 % IV SOLN
391.6000 mg | Freq: Once | INTRAVENOUS | Status: AC
  Administered 2022-10-19: 390 mg via INTRAVENOUS
  Filled 2022-10-19: qty 39

## 2022-10-19 MED ORDER — SODIUM CHLORIDE 0.9 % IV SOLN
1500.0000 mg | Freq: Once | INTRAVENOUS | Status: AC
  Administered 2022-10-19: 1500 mg via INTRAVENOUS
  Filled 2022-10-19: qty 30

## 2022-10-19 MED ORDER — SODIUM CHLORIDE 0.9% FLUSH: 10.0000 mL | Freq: Once | INTRAVENOUS | Status: DC

## 2022-10-19 MED ORDER — SODIUM CHLORIDE 0.9 % IV SOLN
10.0000 mg | Freq: Once | INTRAVENOUS | Status: AC
  Administered 2022-10-19: 10 mg via INTRAVENOUS
  Filled 2022-10-19: qty 10

## 2022-10-19 MED ORDER — SODIUM CHLORIDE 0.9 % IV SOLN: Freq: Once | INTRAVENOUS | Status: AC

## 2022-10-19 MED ORDER — SODIUM CHLORIDE 0.9% FLUSH
10.0000 mL | INTRAVENOUS | Status: DC | PRN
  Administered 2022-10-19: 10 mL

## 2022-10-19 MED ORDER — SODIUM CHLORIDE 0.9 % IV SOLN
80.0000 mg/m2 | Freq: Once | INTRAVENOUS | Status: AC
  Administered 2022-10-19: 158 mg via INTRAVENOUS
  Filled 2022-10-19: qty 7.9

## 2022-10-19 MED ORDER — PALONOSETRON HCL INJECTION 0.25 MG/5ML
0.2500 mg | Freq: Once | INTRAVENOUS | Status: AC
  Administered 2022-10-19: 0.25 mg via INTRAVENOUS
  Filled 2022-10-19: qty 5

## 2022-10-19 MED FILL — Dexamethasone Sodium Phosphate Inj 100 MG/10ML: INTRAMUSCULAR | Qty: 1 | Status: AC

## 2022-10-19 NOTE — Progress Notes (Signed)
Patient seen by Dr. Mohamed  Vitals are within treatment parameters.  Labs reviewed: and are within treatment parameters.  Per physician team, patient is ready for treatment and there are NO modifications to the treatment plan.  

## 2022-10-19 NOTE — Progress Notes (Signed)
[facility] [phone]   [phone]  OFFICE PROGRESS NOTE  Larry Boyden, MD [address] [place] [address]  DIAGNOSIS:  1) Metastatic poorly differentiated carcinoma, neuroendocrine carcinoma of suspicious prostate primary versus primary lung cancer presented with innumerable and bilateral pulmonary nodules in addition to right hilar and mediastinal lymphadenopathy and metastatic disease to several areas of the bone in addition to hypermetabolic activity in the prostate gland diagnosed in January 2024  2) prostate adenocarcinoma diagnosed in February 2024 currently managed by Dr. Mena Goes   PRIOR THERAPY: None   CURRENT THERAPY:  Palliative systemic chemotherapy with carboplatin for AUC of 5 and Imfinzi 1500 Mg IV on day 1 as well as etoposide 100 Mg/M2 on days 1, 2 and 3 with Neulasta support on day 5. Status post 3 cycles. First dose on 08/11/22.  Starting from cycle #2 his carboplatin will be reduced to AUC of 4 and 2 etoposide 80 Mg/M2 secondary to intolerance and cycle #1.  INTERVAL HISTORY: Larry Kent 77 y.o. male returns to the clinic today for follow-up visit accompanied by his son.  The patient is feeling fine today with no concerning complaints except for the baseline fatigue and shortness of breath with exertion.  He had a Port-A-Cath placed recently.  He denied having any current chest pain, cough or hemoptysis.  He has no nausea, vomiting, diarrhea or constipation.  He has no headache or visual changes.  He has no significant weight loss or night sweats.  He started treatment with Zytiga by Dr. Mena Goes.  He is here for evaluation before starting cycle #4 of his chemotherapy.    MEDICAL HISTORY: Past Medical History:  Diagnosis Date   Allergy    Arthritis    both knees    BCC (basal cell carcinoma), arm, right 09/2019   MOHS ([place])   BCC (basal cell carcinoma), face 2014   L preauricular s/p MOHS   BCC  (basal cell carcinoma), face 02/2018   L upper lip   BPH (benign prostatic hypertrophy)    Dr. Mena Goes @ [facility]   Carotid stenosis 05/2015   RICA 40-59%, LICA 1-39%, L vertebral occlusion, rpt 1 yr   Cataract    removed both eyes    FHx: colon cancer    FHx: prostate cancer    Heart murmur    mild aortic stenosis    History of kidney stones    Hyperlipidemia    borderline- on rosuvastatin now normal    Occlusion of right vertebral artery 05/2015    ALLERGIES:  has No Known Allergies.  MEDICATIONS:  Current Outpatient Medications  Medication Sig Dispense Refill   acetaminophen (TYLENOL) 500 MG tablet Take 1,000 mg by mouth every 6 (six) hours as needed for mild pain or headache.     aspirin 81 MG chewable tablet Chew 1 tablet (81 mg total) by mouth daily. 30 tablet 11   fluticasone (FLONASE) 50 MCG/ACT nasal spray Place 1 spray into both nostrils daily as needed for allergies or rhinitis.     lidocaine-prilocaine (EMLA) cream Apply to the Port-A-Cath site 30-60 minutes before chemotherapy treatment (Patient taking differently: Apply 1 Application topically See admin instructions. Apply to the Port-A-Cath site 30-60 minutes before chemotherapy treatment) 30 g 0   metoprolol tartrate (LOPRESSOR) 25 MG tablet Take 0.5 tablets (12.5 mg total) by mouth 2 (two) times daily. 30 tablet 11   Multiple Vitamin (MULTIVITAMIN) tablet Take 1 tablet by mouth  daily with breakfast.     prochlorperazine (COMPAZINE) 10 MG tablet Take 1 tablet (10 mg total) by mouth every 6 (six) hours as needed for nausea or vomiting. 30 tablet 0   rosuvastatin (CRESTOR) 20 MG tablet TAKE 1 TABLET BY MOUTH EVERY DAY (Patient taking differently: Take 20 mg by mouth in the morning.) 90 tablet 1   SYSTANE ULTRA PF 0.4-0.3 % SOLN Place 1 drop into both eyes 3 (three) times daily as needed (for dryness/irritation). 10 each 1   tamsulosin (FLOMAX) 0.4 MG CAPS capsule Take 0.4 mg by mouth 2 (two) times daily.     ticagrelor  (BRILINTA) 90 MG TABS tablet Take 1 tablet (90 mg total) by mouth 2 (two) times daily. 60 tablet 11   Zinc 25 MG TABS Take 25 mg by mouth daily.     No current facility-administered medications for this visit.    SURGICAL HISTORY:  Past Surgical History:  Procedure Laterality Date   BASAL CELL CARCINOMA EXCISION  05/2018   lip, 2020 x3  basal cells removed    CATARACT EXTRACTION  2008   Left   CATARACT EXTRACTION  2013   Right ([place]ppes)   COLONOSCOPY  ~2010   medium int hemorrhoids, o/w WNL, rpt 5 yrs given fmhx (Dr. Randa Evens)   COLONOSCOPY  08/2019   TAs, diverticulosis, rpt 3 yrs ([place]uster)   CORONARY/GRAFT ACUTE MI REVASCULARIZATION N/A 09/07/2022   Procedure: Coronary/Graft Acute MI Revascularization;  Surgeon: Orbie Pyo, MD;  Location: [facility];  Service: Cardiovascular;  Laterality: N/A;   ENDARTERECTOMY Right 12/01/2019   Procedure: RIGHT ENDARTERECTOMY CAROTID;  Surgeon: Cephus Shelling, MD;  Location: [facility] OR;  Service: Vascular;  Laterality: Right;   exercise treadmill  11/2005   WNL Eldridge Dace)   FINE NEEDLE ASPIRATION  07/28/2022   Procedure: FINE NEEDLE ASPIRATION (FNA) LINEAR;  Surgeon: Josephine Igo, DO;  Location: [facility]OS[facility];  Service: Pulmonary;;   HERNIA REPAIR  2008   Right   IR IMAGING GUIDED PORT INSERTION  08/11/2022   IR IMAGING GUIDED PORT INSERTION  10/08/2022   IR REMOVAL TUN ACCESS W/ PORT W/O FL MOD SED  09/10/2022   LEFT HEART CATH AND CORONARY ANGIOGRAPHY N/A 09/07/2022   Procedure: LEFT HEART CATH AND CORONARY ANGIOGRAPHY;  Surgeon: Orbie Pyo, MD;  Location: [facility];  Service: Cardiovascular;  Laterality: N/A;   MOHS SURGERY Left spring 2014   basal cell face   PATCH ANGIOPLASTY Right 12/01/2019   Procedure: PATCH ANGIOPLASTY USING Livia Snellen BIOLOGIC PATCH;  Surgeon: Cephus Shelling, MD;  Location: [facility] OR;  Service: Vascular;  Laterality: Right;   Testicular Biopsy  2003   benign, varicocele   US ECHOCARDIOGRAPHY   11/2005   aortic sclerosis, EF 55-60%, diastolic dysfunction   VIDEO BRONCHOSCOPY WITH ENDOBRONCHIAL ULTRASOUND Bilateral 07/28/2022   Procedure: VIDEO BRONCHOSCOPY WITH ENDOBRONCHIAL ULTRASOUND;  Surgeon: Josephine Igo, DO;  Location: [facility];  Service: Pulmonary;  Laterality: Bilateral;    REVIEW OF SYSTEMS:  A comprehensive review of systems was negative except for: Constitutional: positive for fatigue Respiratory: positive for dyspnea on exertion   PHYSICAL EXAMINATION: General appearance: alert, cooperative, fatigued, and no distress Head: Normocephalic, without obvious abnormality, atraumatic Neck: no adenopathy, no JVD, supple, symmetrical, trachea midline, and thyroid not enlarged, symmetric, no tenderness/mass/nodules Lymph nodes: Cervical, supraclavicular, and axillary nodes normal. Resp: clear to auscultation bilaterally Back: symmetric, no curvature. ROM normal. No CVA tenderness. Cardio: regular rate and rhythm, S1, S2  normal, no murmur, click, rub or gallop GI: soft, non-tender; bowel sounds normal; no masses,  no organomegaly Extremities: extremities normal, atraumatic, no cyanosis or edema  ECOG PERFORMANCE STATUS: 1 - Symptomatic but completely ambulatory  Blood pressure 120/64, pulse 66, temperature 97.6 F (36.4 C), temperature source Oral, resp. rate 13, weight 176 lb 9.6 oz (80.1 kg), SpO2 100 %.  LABORATORY DATA: Lab Results  Component Value Date   WBC 9.2 10/12/2022   HGB 8.3 (L) 10/12/2022   HCT 25.0 (L) 10/12/2022   MCV 101.6 (H) 10/12/2022   PLT 69 (L) 10/12/2022      Chemistry      Component Value Date/Time   NA 139 10/12/2022 1407   K 3.8 10/12/2022 1407   CL 106 10/12/2022 1407   CO2 26 10/12/2022 1407   BUN 22 10/12/2022 1407   CREATININE 1.04 10/12/2022 1407      Component Value Date/Time   CALCIUM 9.4 10/12/2022 1407   ALKPHOS 164 (H) 10/12/2022 1407   AST 19 10/12/2022 1407   ALT 18 10/12/2022 1407   BILITOT 0.4 10/12/2022 1407        RADIOGRAPHIC STUDIES: IR IMAGING GUIDED PORT INSERTION  Result Date: 10/08/2022 INDICATION: History of lung cancer. In need of durable intravenous access for chemotherapy administration Patient with history previous right-sided port a catheter placed on 02/30/2024 however the port a Catheter was removed on 09/10/2022 secondary to bacteremia however ultimately this was felt not to be associated with the right-sided port a catheter. EXAM: IMPLANTED PORT A CATH PLACEMENT WITH ULTRASOUND AND FLUOROSCOPIC GUIDANCE COMPARISON:  CT the chest, abdomen and pelvis-09/17/2022 MEDICATIONS: None ANESTHESIA/SEDATION: Moderate (conscious) sedation was employed during this procedure as administered by the Interventional Radiology RN. A total of Versed 2 mg and Fentanyl 100 mcg was administered intravenously. Moderate Sedation Time: 28 minutes. The patient's level of consciousness and vital signs were monitored continuously by radiology nursing throughout the procedure under my direct supervision. CONTRAST:  None FLUOROSCOPY TIME:  1 minute, 6 seconds (6 mGy) COMPLICATIONS: None immediate. PROCEDURE: The procedure, risks, benefits, and alternatives were explained to the patient. Questions regarding the procedure were encouraged and answered. The patient understands and consents to the procedure. Given recent removal of the right-sided port a catheter, the decision was made to place a left internal jugular approach port a catheter. The left neck and chest were prepped with chlorhexidine in a sterile fashion, and a sterile drape was applied covering the operative field. Maximum barrier sterile technique with sterile gowns and gloves were used for the procedure. A timeout was performed prior to the initiation of the procedure. Local anesthesia was provided with 1% lidocaine with epinephrine. After creating a small venotomy incision, a micropuncture kit was utilized to access the internal jugular vein. Real-time ultrasound  guidance was utilized for vascular access including the acquisition of a permanent ultrasound image documenting patency of the accessed vessel. The microwire was utilized to measure appropriate catheter length. A subcutaneous port pocket was then created along the upper chest wall utilizing a combination of sharp and blunt dissection. The pocket was irrigated with sterile saline. A single lumen Slim sized power injectable port was chosen for placement. The 8 Fr catheter was tunneled from the port pocket site to the venotomy incision. The port was placed in the pocket. The external catheter was trimmed to appropriate length. At the venotomy, an 8 Fr peel-away sheath was placed over a guidewire under fluoroscopic guidance. The catheter was then placed through the  sheath and the sheath was removed. Final catheter positioning was confirmed and documented with a fluoroscopic spot radiograph. The port was accessed with a Huber needle, aspirated and flushed with heparinized saline. The venotomy site was closed with an interrupted 4-0 Vicryl suture. The port pocket incision was closed with interrupted 2-0 Vicryl suture. Dermabond and Steri-strips were applied to both incisions. Dressings were applied. The patient tolerated the procedure well without immediate post procedural complication. FINDINGS: After catheter placement, the tip lies within the superior cavoatrial junction. The catheter aspirates and flushes normally and is ready for immediate use. IMPRESSION: Successful placement of a left internal jugular approach power injectable Port-A-Cath. The catheter is ready for immediate use. Electronically Signed   By: Simonne Come M.D.   On: 10/08/2022 17:04    ASSESSMENT AND PLAN: This is a very pleasant 77 years old white male with: 1) Metastatic poorly differentiated carcinoma, neuroendocrine carcinoma of suspicious prostate primary versus primary lung cancer presented with innumerable and bilateral pulmonary nodules in  addition to right hilar and mediastinal lymphadenopathy and metastatic disease to several areas of the bone in addition to hypermetabolic activity in the prostate gland diagnosed in January 2024  The patient is currently undergoing systemic chemotherapy with carboplatin for AUC of 5 on day 1, Imfinzi 1500 Mg IV on day 1, etoposide 100 Mg/M2 on days 1, 2 and 3 with Neulasta support every 3 weeks status post 3 cycles. The patient has been tolerating this treatment well.  He was recently diagnosed with acute STEMI and currently on treatment with aspirin, Brilinta as well as metoprolol. He has a new Port-A-Cath placed recently after the first 1 was removed secondary to bacteremia and infection. I recommended for him to proceed with cycle #4 today as planned.. I will see the patient back for follow-up visit in 3 weeks for evaluation with repeat CT scan of the chest, abdomen and pelvis for restaging of his disease.  2) prostate adenocarcinoma diagnosed in February 2024 currently managed by Dr. Mena Goes. The patient was advised to call immediately if he has any concerning symptoms in the interval.  The patient voices understanding of current disease status and treatment options and is in agreement with the current care plan.  All questions were answered. The patient knows to call the clinic with any problems, questions or concerns. We can certainly see the patient much sooner if necessary.  The total time spent in the appointment was 20 minutes.  Disclaimer: This note was dictated with voice recognition software. Similar sounding words can inadvertently be transcribed and may not be corrected upon review.

## 2022-10-19 NOTE — Patient Instructions (Signed)
[facility]  Discharge Instructions: Thank you for choosing [facility] to provide your oncology and hematology care.   If you have a lab appointment with the Cancer Center, please go directly to the Cancer Center and check in at the registration area.   Wear comfortable clothing and clothing appropriate for easy access to any Portacath or PICC line.   We strive to give you quality time with your provider. You may need to reschedule your appointment if you arrive late (15 or more minutes).  Arriving late affects you and other patients whose appointments are after yours.  Also, if you miss three or more appointments without notifying the office, you may be dismissed from the clinic at the provider's discretion.      For prescription refill requests, have your pharmacy contact our office and allow 72 hours for refills to be completed.    Today you received the following chemotherapy and/or immunotherapy agents: Durvalumab, Carboplatin, Etoposide      To help prevent nausea and vomiting after your treatment, we encourage you to take your nausea medication as directed.  BELOW ARE SYMPTOMS THAT SHOULD BE REPORTED IMMEDIATELY: *FEVER GREATER THAN 100.4 F (38 C) OR HIGHER *CHILLS OR SWEATING *NAUSEA AND VOMITING THAT IS NOT CONTROLLED WITH YOUR NAUSEA MEDICATION *UNUSUAL SHORTNESS OF BREATH *UNUSUAL BRUISING OR BLEEDING *URINARY PROBLEMS (pain or burning when urinating, or frequent urination) *BOWEL PROBLEMS (unusual diarrhea, constipation, pain near the anus) TENDERNESS IN MOUTH AND THROAT WITH OR WITHOUT PRESENCE OF ULCERS (sore throat, sores in mouth, or a toothache) UNUSUAL RASH, SWELLING OR PAIN  UNUSUAL VAGINAL DISCHARGE OR ITCHING   Items with * indicate a potential emergency and should be followed up as soon as possible or go to the Emergency Department if any problems should occur.  Please show the CHEMOTHERAPY ALERT CARD or  IMMUNOTHERAPY ALERT CARD at check-in to the Emergency Department and triage nurse.  Should you have questions after your visit or need to cancel or reschedule your appointment, please contact [facility]  Dept: [phone]  and follow the prompts.  Office hours are 8:00 a.m. to 4:30 p.m. Monday - Friday. Please note that voicemails left after 4:00 p.m. may not be returned until the following business day.  We are closed weekends and major holidays. You have access to a nurse at all times for urgent questions. Please call the main number to the clinic Dept: [phone] and follow the prompts.   For any non-urgent questions, you may also contact your provider using MyChart. We now offer e-Visits for anyone 18 and older to request care online for non-urgent symptoms. For details visit [email or web address].   Also download the MyChart app! Go to the app store, search "MyChart", open the app, select , and log in with your MyChart username and password.   

## 2022-10-20 ENCOUNTER — Ambulatory Visit: Payer: Medicare Other | Admitting: Vascular Surgery

## 2022-10-20 ENCOUNTER — Ambulatory Visit (INDEPENDENT_AMBULATORY_CARE_PROVIDER_SITE_OTHER)
Admission: RE | Admit: 2022-10-20 | Discharge: 2022-10-20 | Disposition: A | Discharge status: Home / Self Care | Payer: Medicare Other | Source: Ambulatory Visit | Attending: Vascular Surgery | Admitting: Vascular Surgery

## 2022-10-20 ENCOUNTER — Encounter: Payer: Self-pay | Admitting: Vascular Surgery

## 2022-10-20 ENCOUNTER — Inpatient Hospital Stay: Payer: Medicare Other

## 2022-10-20 VITALS — BP 118/62 | HR 64 | Temp 97.8°F | Resp 17

## 2022-10-20 VITALS — BP 123/64 | HR 63 | Temp 98.1°F | Resp 16 | Ht 69.0 in | Wt 179.0 lb

## 2022-10-20 DIAGNOSIS — I6523 Occlusion and stenosis of bilateral carotid arteries: Secondary | ICD-10-CM | POA: Insufficient documentation

## 2022-10-20 DIAGNOSIS — C7A1 Malignant poorly differentiated neuroendocrine tumors: Secondary | ICD-10-CM

## 2022-10-20 DIAGNOSIS — Z79899 Other long term (current) drug therapy: Secondary | ICD-10-CM | POA: Diagnosis not present

## 2022-10-20 DIAGNOSIS — J90 Pleural effusion, not elsewhere classified: Secondary | ICD-10-CM | POA: Diagnosis not present

## 2022-10-20 DIAGNOSIS — Z5111 Encounter for antineoplastic chemotherapy: Secondary | ICD-10-CM | POA: Diagnosis not present

## 2022-10-20 DIAGNOSIS — C7951 Secondary malignant neoplasm of bone: Secondary | ICD-10-CM | POA: Diagnosis not present

## 2022-10-20 MED ORDER — SODIUM CHLORIDE 0.9 % IV SOLN: Freq: Once | INTRAVENOUS | Status: AC

## 2022-10-20 MED ORDER — SODIUM CHLORIDE 0.9 % IV SOLN
10.0000 mg | Freq: Once | INTRAVENOUS | Status: AC
  Administered 2022-10-20: 10 mg via INTRAVENOUS
  Filled 2022-10-20: qty 10

## 2022-10-20 MED ORDER — SODIUM CHLORIDE 0.9 % IV SOLN
80.0000 mg/m2 | Freq: Once | INTRAVENOUS | Status: AC
  Administered 2022-10-20: 158 mg via INTRAVENOUS
  Filled 2022-10-20: qty 7.9

## 2022-10-20 MED FILL — Dexamethasone Sodium Phosphate Inj 100 MG/10ML: INTRAMUSCULAR | Qty: 1 | Status: AC

## 2022-10-20 NOTE — Patient Instructions (Signed)
[facility]  Discharge Instructions: Thank you for choosing [facility] to provide your oncology and hematology care.   If you have a lab appointment with the Cancer Center, please go directly to the Cancer Center and check in at the registration area.   Wear comfortable clothing and clothing appropriate for easy access to any Portacath or PICC line.   We strive to give you quality time with your provider. You may need to reschedule your appointment if you arrive late (15 or more minutes).  Arriving late affects you and other patients whose appointments are after yours.  Also, if you miss three or more appointments without notifying the office, you may be dismissed from the clinic at the provider's discretion.      For prescription refill requests, have your pharmacy contact our office and allow 72 hours for refills to be completed.    Today you received the following chemotherapy and/or immunotherapy agents; Etoposide      To help prevent nausea and vomiting after your treatment, we encourage you to take your nausea medication as directed.  BELOW ARE SYMPTOMS THAT SHOULD BE REPORTED IMMEDIATELY: *FEVER GREATER THAN 100.4 F (38 C) OR HIGHER *CHILLS OR SWEATING *NAUSEA AND VOMITING THAT IS NOT CONTROLLED WITH YOUR NAUSEA MEDICATION *UNUSUAL SHORTNESS OF BREATH *UNUSUAL BRUISING OR BLEEDING *URINARY PROBLEMS (pain or burning when urinating, or frequent urination) *BOWEL PROBLEMS (unusual diarrhea, constipation, pain near the anus) TENDERNESS IN MOUTH AND THROAT WITH OR WITHOUT PRESENCE OF ULCERS (sore throat, sores in mouth, or a toothache) UNUSUAL RASH, SWELLING OR PAIN  UNUSUAL VAGINAL DISCHARGE OR ITCHING   Items with * indicate a potential emergency and should be followed up as soon as possible or go to the Emergency Department if any problems should occur.  Please show the CHEMOTHERAPY ALERT CARD or IMMUNOTHERAPY ALERT CARD at  check-in to the Emergency Department and triage nurse.  Should you have questions after your visit or need to cancel or reschedule your appointment, please contact [facility]  Dept: [phone]  and follow the prompts.  Office hours are 8:00 a.m. to 4:30 p.m. Monday - Friday. Please note that voicemails left after 4:00 p.m. may not be returned until the following business day.  We are closed weekends and major holidays. You have access to a nurse at all times for urgent questions. Please call the main number to the clinic Dept: [phone] and follow the prompts.   For any non-urgent questions, you may also contact your provider using MyChart. We now offer e-Visits for anyone 18 and older to request care online for non-urgent symptoms. For details visit [email or web address].   Also download the MyChart app! Go to the app store, search "MyChart", open the app, select [facility], and log in with your MyChart username and password.   

## 2022-10-20 NOTE — Progress Notes (Signed)
Patient name: Larry Kent MRN: [id number] DOB: 27-Nov-1945 Sex: male  REASON FOR VISIT:1 year follow-up for surveillance of carotid artery disease  HPI: Larry Kent is a 77 y.o. male that presents for 1 year  follow-up for surveillance of carotid artery disease.  He reports no new neurologic events or other issues over the past year.  He previously underwent a right carotid endarterectomy on 12/01/2019 for an asymptomatic high-grade stenosis.  He remains on aspirin statin.  He does not smoke.    Since his last visit unfortunately has been diagnosed with metastatic poorly differentiated carcinoma suspicious for primary prostate versus primary lung.  Now undergoing chemotherapy.  Also had a heart attack recently requiring PCI.  Past Medical History:  Diagnosis Date   Allergy    Arthritis    both knees    BCC (basal cell carcinoma), arm, right 09/2019   MOHS (Mitkov)   BCC (basal cell carcinoma), face 2014   L preauricular s/p MOHS   BCC (basal cell carcinoma), face 02/2018   L upper lip   BPH (benign prostatic hypertrophy)    Dr. Mena Goes @ [facility]   Carotid stenosis 05/2015   RICA 40-59%, LICA 1-39%, L vertebral occlusion, rpt 1 yr   Cataract    removed both eyes    FHx: colon cancer    FHx: prostate cancer    Heart murmur    mild aortic stenosis    History of kidney stones    Hyperlipidemia    borderline- on rosuvastatin now normal    Occlusion of right vertebral artery 05/2015    Past Surgical History:  Procedure Laterality Date   BASAL CELL CARCINOMA EXCISION  05/2018   lip, 2020 x3  basal cells removed    CATARACT EXTRACTION  2008   Left   CATARACT EXTRACTION  2013   Right (Eppes)   COLONOSCOPY  ~2010   medium int hemorrhoids, o/w WNL, rpt 5 yrs given fmhx (Dr. Randa Evens)   COLONOSCOPY  08/2019   TAs, diverticulosis, rpt 3 yrs (Armbruster)   CORONARY/GRAFT ACUTE MI REVASCULARIZATION N/A 09/07/2022   Procedure: Coronary/Graft Acute MI Revascularization;   Surgeon: Orbie Pyo, MD;  [facility];  Service: Cardiovascular;  Laterality: N/A;   ENDARTERECTOMY Right 12/01/2019   Procedure: RIGHT ENDARTERECTOMY CAROTID;  Surgeon: Cephus Shelling, MD;  Location: [facility] OR;  Service: Vascular;  Laterality: Right;   exercise treadmill  11/2005   WNL Eldridge Dace)   FINE NEEDLE ASPIRATION  07/28/2022   Procedure: FINE NEEDLE ASPIRATION (FNA) LINEAR;  Surgeon: Josephine Igo, DO;  [facility];  Service: Pulmonary;;   HERNIA REPAIR  2008   Right   IR IMAGING GUIDED PORT INSERTION  08/11/2022   IR IMAGING GUIDED PORT INSERTION  10/08/2022   IR REMOVAL TUN ACCESS W/ PORT W/O FL MOD SED  09/10/2022   LEFT HEART CATH AND CORONARY ANGIOGRAPHY N/A 09/07/2022   Procedure: LEFT HEART CATH AND CORONARY ANGIOGRAPHY;  Surgeon: Orbie Pyo, MD;  [facility];  Service: Cardiovascular;  Laterality: N/A;   MOHS SURGERY Left spring 2014   basal cell face   PATCH ANGIOPLASTY Right 12/01/2019   Procedure: PATCH ANGIOPLASTY USING Livia Snellen BIOLOGIC PATCH;  Surgeon: Cephus Shelling, MD;  Location: [facility] OR;  Service: Vascular;  Laterality: Right;   Testicular Biopsy  2003   benign, varicocele   US ECHOCARDIOGRAPHY  11/2005   aortic sclerosis, EF 55-60%, diastolic dysfunction   VIDEO BRONCHOSCOPY  WITH ENDOBRONCHIAL ULTRASOUND Bilateral 07/28/2022   Procedure: VIDEO BRONCHOSCOPY WITH ENDOBRONCHIAL ULTRASOUND;  Surgeon: Josephine Igo, DO;  [facility];  Service: Pulmonary;  Laterality: Bilateral;    Family History  Problem Relation Age of Onset   Cancer Father 31       prostate   Coronary artery disease Father 46       MI   Prostate cancer Father    Cancer Paternal Grandfather 36       prostate   Prostate cancer Paternal Grandfather    Stroke Mother    Hypertension Mother    Cancer Mother 88       colon   Colon cancer Mother    Coronary artery disease Maternal Uncle    Cancer Sister 21       breast    Breast cancer Sister    Colon cancer Maternal Grandmother    Diabetes Neg Hx    Colon polyps Neg Hx    Esophageal cancer Neg Hx    Rectal cancer Neg Hx    Stomach cancer Neg Hx     SOCIAL HISTORY: Social History   Tobacco Use   Smoking status: Never   Smokeless tobacco: Never  Substance Use Topics   Alcohol use: Not Currently    Comment: rare    No Known Allergies  Current Outpatient Medications  Medication Sig Dispense Refill   abiraterone acetate (ZYTIGA) 250 MG tablet Take 1,000 mg by mouth daily.     acetaminophen (TYLENOL) 500 MG tablet Take 1,000 mg by mouth every 6 (six) hours as needed for mild pain or headache.     aspirin 81 MG chewable tablet Chew 1 tablet (81 mg total) by mouth daily. 30 tablet 11   Calcium Carb-Cholecalciferol (RA CALCIUM PLUS VITAMIN D) 600-10 MG-MCG TABS Take 600 mg by mouth daily.     fluticasone (FLONASE) 50 MCG/ACT nasal spray Place 1 spray into both nostrils daily as needed for allergies or rhinitis.     lidocaine-prilocaine (EMLA) cream Apply to the Port-A-Cath site 30-60 minutes before chemotherapy treatment (Patient taking differently: Apply 1 Application topically See admin instructions. Apply to the Port-A-Cath site 30-60 minutes before chemotherapy treatment) 30 g 0   metoprolol tartrate (LOPRESSOR) 25 MG tablet Take 0.5 tablets (12.5 mg total) by mouth 2 (two) times daily. 30 tablet 11   Multiple Vitamin (MULTIVITAMIN) tablet Take 1 tablet by mouth daily with breakfast.     predniSONE (DELTASONE) 5 MG tablet Take 5 mg by mouth daily.     prochlorperazine (COMPAZINE) 10 MG tablet Take 1 tablet (10 mg total) by mouth every 6 (six) hours as needed for nausea or vomiting. 30 tablet 0   rosuvastatin (CRESTOR) 20 MG tablet TAKE 1 TABLET BY MOUTH EVERY DAY (Patient taking differently: Take 20 mg by mouth in the morning.) 90 tablet 1   SYSTANE ULTRA PF 0.4-0.3 % SOLN Place 1 drop into both eyes 3 (three) times daily as needed (for  dryness/irritation). 10 each 1   tamsulosin (FLOMAX) 0.4 MG CAPS capsule Take 0.4 mg by mouth 2 (two) times daily.     ticagrelor (BRILINTA) 90 MG TABS tablet Take 1 tablet (90 mg total) by mouth 2 (two) times daily. 60 tablet 11   Zinc 25 MG TABS Take 25 mg by mouth daily.     No current facility-administered medications for this visit.    REVIEW OF SYSTEMS:  [X]  denotes positive finding, [ ]  denotes negative finding Cardiac  Comments:  Chest pain or chest pressure:    Shortness of breath upon exertion:    Short of breath when lying flat:    Irregular heart rhythm:        Vascular    Pain in calf, thigh, or hip brought on by ambulation:    Pain in feet at night that wakes you up from your sleep:     Blood clot in your veins:    Leg swelling:         Pulmonary    Oxygen at home:    Productive cough:     Wheezing:         Neurologic    Sudden weakness in arms or legs:     Sudden numbness in arms or legs:     Sudden onset of difficulty speaking or slurred speech:    Temporary loss of vision in one eye:     Problems with dizziness:         Gastrointestinal    Blood in stool:     Vomited blood:         Genitourinary    Burning when urinating:     Blood in urine:        Psychiatric    Major depression:         Hematologic    Bleeding problems:    Problems with blood clotting too easily:        Skin    Rashes or ulcers:        Constitutional    Fever or chills:      PHYSICAL EXAM: Vitals:   10/20/22 1507 10/20/22 1508  BP: 112/63 123/64  Pulse: (!) 54 63  Resp: 16   Temp: 98.1 F (36.7 C)   TempSrc: Temporal   SpO2: 96%   Weight: 179 lb (81.2 kg)   Height:  (1.753 m)     GENERAL: The patient is a well-nourished male, in no acute distress. The vital signs are documented above. CARDIAC: There is a regular rate and rhythm.  VASCULAR:  Right neck incision well-healed MUSCULOSKELETAL: There are no major deformities or cyanosis. NEUROLOGIC: No focal  weakness or paresthesias are detected.  Cranial nerves II through XII grossly intact.   DATA:   Carotid duplex today shows patent right carotid after previous endarterectomy and minimal 1 to 39% stenosis in the left ICA.  Assessment/Plan:  77 year old male status post right carotid endarterectomy on 12/01/2019 for an asymptomatic high-grade stenosis presents for 1 year follow-up for surveillance of his carotid artery disease.  Discussed with him today that his right carotid endarterectomy site remains patent with no evidence of recurrent stenosis.  In addition he has minimal disease in the left ICA 1-39% with no progression over the past year.  He needs to remain on aspirin statin for risk reduction.  Would continue surveillance with another carotid ultrasound in 1 year in our office.  Now undergoing chemotherapy for metastatic poorly differentiated carcinoma suspicious for prostate primary versus lung primary.  Discussed he call with any questions or concerns.   Cephus Shelling, MD [facility] Office: [phone]

## 2022-10-21 ENCOUNTER — Other Ambulatory Visit (HOSPITAL_COMMUNITY): Payer: Self-pay

## 2022-10-21 ENCOUNTER — Other Ambulatory Visit: Payer: Self-pay

## 2022-10-21 ENCOUNTER — Inpatient Hospital Stay: Payer: Medicare Other

## 2022-10-21 VITALS — BP 140/78 | HR 62 | Temp 97.7°F | Resp 18

## 2022-10-21 DIAGNOSIS — I6523 Occlusion and stenosis of bilateral carotid arteries: Secondary | ICD-10-CM | POA: Diagnosis not present

## 2022-10-21 DIAGNOSIS — Z79899 Other long term (current) drug therapy: Secondary | ICD-10-CM | POA: Diagnosis not present

## 2022-10-21 DIAGNOSIS — C7951 Secondary malignant neoplasm of bone: Secondary | ICD-10-CM | POA: Diagnosis not present

## 2022-10-21 DIAGNOSIS — J90 Pleural effusion, not elsewhere classified: Secondary | ICD-10-CM | POA: Diagnosis not present

## 2022-10-21 DIAGNOSIS — C7A1 Malignant poorly differentiated neuroendocrine tumors: Secondary | ICD-10-CM

## 2022-10-21 DIAGNOSIS — Z5111 Encounter for antineoplastic chemotherapy: Secondary | ICD-10-CM | POA: Diagnosis not present

## 2022-10-21 MED ORDER — SODIUM CHLORIDE 0.9 % IV SOLN: Freq: Once | INTRAVENOUS | Status: AC

## 2022-10-21 MED ORDER — HEPARIN SOD (PORK) LOCK FLUSH 100 UNIT/ML IV SOLN
500.0000 [IU] | Freq: Once | INTRAVENOUS | Status: AC | PRN
  Administered 2022-10-21: 500 [IU]

## 2022-10-21 MED ORDER — SODIUM CHLORIDE 0.9 % IV SOLN
80.0000 mg/m2 | Freq: Once | INTRAVENOUS | Status: AC
  Administered 2022-10-21: 158 mg via INTRAVENOUS
  Filled 2022-10-21: qty 7.9

## 2022-10-21 MED ORDER — SODIUM CHLORIDE 0.9 % IV SOLN
10.0000 mg | Freq: Once | INTRAVENOUS | Status: AC
  Administered 2022-10-21: 10 mg via INTRAVENOUS
  Filled 2022-10-21: qty 10

## 2022-10-21 MED ORDER — SODIUM CHLORIDE 0.9% FLUSH
10.0000 mL | INTRAVENOUS | Status: DC | PRN
  Administered 2022-10-21: 10 mL

## 2022-10-21 NOTE — Patient Instructions (Signed)
[facility]  Discharge Instructions: Thank you for choosing [facility] to provide your oncology and hematology care.   If you have a lab appointment with the Cancer Center, please go directly to the Cancer Center and check in at the registration area.   Wear comfortable clothing and clothing appropriate for easy access to any Portacath or PICC line.   We strive to give you quality time with your provider. You may need to reschedule your appointment if you arrive late (15 or more minutes).  Arriving late affects you and other patients whose appointments are after yours.  Also, if you miss three or more appointments without notifying the office, you may be dismissed from the clinic at the provider's discretion.      For prescription refill requests, have your pharmacy contact our office and allow 72 hours for refills to be completed.    Today you received the following chemotherapy and/or immunotherapy agents; Etoposide      To help prevent nausea and vomiting after your treatment, we encourage you to take your nausea medication as directed.  BELOW ARE SYMPTOMS THAT SHOULD BE REPORTED IMMEDIATELY: *FEVER GREATER THAN 100.4 F (38 C) OR HIGHER *CHILLS OR SWEATING *NAUSEA AND VOMITING THAT IS NOT CONTROLLED WITH YOUR NAUSEA MEDICATION *UNUSUAL SHORTNESS OF BREATH *UNUSUAL BRUISING OR BLEEDING *URINARY PROBLEMS (pain or burning when urinating, or frequent urination) *BOWEL PROBLEMS (unusual diarrhea, constipation, pain near the anus) TENDERNESS IN MOUTH AND THROAT WITH OR WITHOUT PRESENCE OF ULCERS (sore throat, sores in mouth, or a toothache) UNUSUAL RASH, SWELLING OR PAIN  UNUSUAL VAGINAL DISCHARGE OR ITCHING   Items with * indicate a potential emergency and should be followed up as soon as possible or go to the Emergency Department if any problems should occur.  Please show the CHEMOTHERAPY ALERT CARD or IMMUNOTHERAPY ALERT CARD at  check-in to the Emergency Department and triage nurse.  Should you have questions after your visit or need to cancel or reschedule your appointment, please contact [facility]  Dept: [phone]  and follow the prompts.  Office hours are 8:00 a.m. to 4:30 p.m. Monday - Friday. Please note that voicemails left after 4:00 p.m. may not be returned until the following business day.  We are closed weekends and major holidays. You have access to a nurse at all times for urgent questions. Please call the main number to the clinic Dept: [phone] and follow the prompts.   For any non-urgent questions, you may also contact your provider using MyChart. We now offer e-Visits for anyone 18 and older to request care online for non-urgent symptoms. For details visit [email or web address].   Also download the MyChart app! Go to the app store, search "MyChart", open the app, select [facility], and log in with your MyChart username and password.   

## 2022-10-22 ENCOUNTER — Telehealth: Payer: Self-pay | Admitting: Internal Medicine

## 2022-10-22 NOTE — Telephone Encounter (Signed)
Called patient regarding upcoming April/May appointments, left a voicemail. 

## 2022-10-23 ENCOUNTER — Other Ambulatory Visit: Payer: Self-pay

## 2022-10-23 ENCOUNTER — Other Ambulatory Visit (HOSPITAL_COMMUNITY): Payer: Self-pay

## 2022-10-23 ENCOUNTER — Inpatient Hospital Stay: Payer: Medicare Other

## 2022-10-23 VITALS — BP 117/69 | HR 64 | Temp 97.9°F | Resp 16

## 2022-10-23 DIAGNOSIS — I6523 Occlusion and stenosis of bilateral carotid arteries: Secondary | ICD-10-CM | POA: Diagnosis not present

## 2022-10-23 DIAGNOSIS — Z5111 Encounter for antineoplastic chemotherapy: Secondary | ICD-10-CM | POA: Diagnosis not present

## 2022-10-23 DIAGNOSIS — J90 Pleural effusion, not elsewhere classified: Secondary | ICD-10-CM | POA: Diagnosis not present

## 2022-10-23 DIAGNOSIS — C7A1 Malignant poorly differentiated neuroendocrine tumors: Secondary | ICD-10-CM

## 2022-10-23 DIAGNOSIS — C7951 Secondary malignant neoplasm of bone: Secondary | ICD-10-CM | POA: Diagnosis not present

## 2022-10-23 DIAGNOSIS — Z79899 Other long term (current) drug therapy: Secondary | ICD-10-CM | POA: Diagnosis not present

## 2022-10-23 MED ORDER — PEGFILGRASTIM-CBQV 6 MG/0.6ML ~~LOC~~ SOSY
6.0000 mg | PREFILLED_SYRINGE | Freq: Once | SUBCUTANEOUS | Status: AC
  Administered 2022-10-23: 6 mg via SUBCUTANEOUS
  Filled 2022-10-23: qty 0.6

## 2022-10-26 ENCOUNTER — Other Ambulatory Visit: Payer: Self-pay

## 2022-10-26 ENCOUNTER — Inpatient Hospital Stay: Payer: Medicare Other

## 2022-10-26 ENCOUNTER — Other Ambulatory Visit: Payer: Medicare Other

## 2022-10-26 DIAGNOSIS — J90 Pleural effusion, not elsewhere classified: Secondary | ICD-10-CM | POA: Diagnosis not present

## 2022-10-26 DIAGNOSIS — I6523 Occlusion and stenosis of bilateral carotid arteries: Secondary | ICD-10-CM | POA: Diagnosis not present

## 2022-10-26 DIAGNOSIS — Z79899 Other long term (current) drug therapy: Secondary | ICD-10-CM | POA: Diagnosis not present

## 2022-10-26 DIAGNOSIS — C7951 Secondary malignant neoplasm of bone: Secondary | ICD-10-CM | POA: Diagnosis not present

## 2022-10-26 DIAGNOSIS — C7A1 Malignant poorly differentiated neuroendocrine tumors: Secondary | ICD-10-CM

## 2022-10-26 DIAGNOSIS — Z5111 Encounter for antineoplastic chemotherapy: Secondary | ICD-10-CM | POA: Diagnosis not present

## 2022-10-26 LAB — CMP (CANCER CENTER ONLY)
ALT: 12 U/L (ref 0–44)
AST: 17 U/L (ref 15–41)
Albumin: 3.6 g/dL (ref 3.5–5.0)
Alkaline Phosphatase: 139 U/L — ABNORMAL HIGH (ref 38–126)
Anion gap: 6 (ref 5–15)
BUN: 31 mg/dL — ABNORMAL HIGH (ref 8–23)
CO2: 25 mmol/L (ref 22–32)
Calcium: 9.1 mg/dL (ref 8.9–10.3)
Chloride: 107 mmol/L (ref 98–111)
Creatinine: 0.85 mg/dL (ref 0.61–1.24)
GFR, Estimated: >60 mL/min (ref >60)
Glucose, Bld: 149 mg/dL — ABNORMAL HIGH (ref 70–99)
Potassium: 4.3 mmol/L (ref 3.5–5.1)
Sodium: 138 mmol/L (ref 135–145)
Total Bilirubin: 0.8 mg/dL (ref 0.3–1.2)
Total Protein: 6.3 g/dL — ABNORMAL LOW (ref 6.5–8.1)

## 2022-10-26 LAB — CBC WITH DIFFERENTIAL (CANCER CENTER ONLY)
Abs Immature Granulocytes: 0.38 10*3/uL — ABNORMAL HIGH (ref 0.00–0.07)
Basophils Absolute: 0.1 10*3/uL (ref 0.0–0.1)
Basophils Relative: 1 %
Eosinophils Absolute: 0.1 10*3/uL (ref 0.0–0.5)
Eosinophils Relative: 1 %
HCT: 24.3 % — ABNORMAL LOW (ref 39.0–52.0)
Hemoglobin: 8.4 g/dL — ABNORMAL LOW (ref 13.0–17.0)
Immature Granulocytes: 6 %
Lymphocytes Relative: 19 %
Lymphs Abs: 1.1 10*3/uL (ref 0.7–4.0)
MCH: 35.3 pg — ABNORMAL HIGH (ref 26.0–34.0)
MCHC: 34.6 g/dL (ref 30.0–36.0)
MCV: 102.1 fL — ABNORMAL HIGH (ref 80.0–100.0)
Monocytes Absolute: 0.2 10*3/uL (ref 0.1–1.0)
Monocytes Relative: 3 %
Neutro Abs: 4.2 10*3/uL (ref 1.7–7.7)
Neutrophils Relative %: 70 %
Platelet Count: 154 10*3/uL (ref 150–400)
RBC: 2.38 MIL/uL — ABNORMAL LOW (ref 4.22–5.81)
RDW: 17.8 % — ABNORMAL HIGH (ref 11.5–15.5)
Smear Review: NORMAL
WBC Count: 6 10*3/uL (ref 4.0–10.5)
nRBC: 0 % (ref 0.0–0.2)

## 2022-10-26 LAB — SAMPLE TO BLOOD BANK

## 2022-10-26 NOTE — Progress Notes (Signed)
CRITICAL VALUE STICKER  CRITICAL VALUE: HGB 8.4  RECEIVER (on-site recipient of call): Dalyla Chui P. LPN  DATE & TIME NOTIFIED:  10/26/22 158 p  MESSENGER (representative from lab): Shanda Bumps  MD NOTIFIED: Dr. Arbutus Ped  TIME OF NOTIFICATION: 2 pm

## 2022-10-27 ENCOUNTER — Ambulatory Visit: Payer: Medicare Other | Admitting: Cardiovascular Disease

## 2022-10-27 ENCOUNTER — Encounter: Payer: Self-pay | Admitting: Cardiovascular Disease

## 2022-10-27 ENCOUNTER — Ambulatory Visit: Payer: Medicare Other | Attending: Cardiovascular Disease | Admitting: Cardiovascular Disease

## 2022-10-27 VITALS — BP 110/60 | HR 79 | Ht 69.0 in | Wt 173.5 lb

## 2022-10-27 DIAGNOSIS — E785 Hyperlipidemia, unspecified: Secondary | ICD-10-CM | POA: Diagnosis not present

## 2022-10-27 DIAGNOSIS — I350 Nonrheumatic aortic (valve) stenosis: Secondary | ICD-10-CM | POA: Diagnosis not present

## 2022-10-27 DIAGNOSIS — I779 Disorder of arteries and arterioles, unspecified: Secondary | ICD-10-CM | POA: Diagnosis not present

## 2022-10-27 DIAGNOSIS — I2510 Atherosclerotic heart disease of native coronary artery without angina pectoris: Secondary | ICD-10-CM

## 2022-10-27 NOTE — Progress Notes (Unsigned)
Cardiology Office Note   Date:  10/27/2022   ID:  Larry, Kent Mar 31, 1946, MRN [id number]  PCP:  [name], MD  Cardiologist:   Lorine Bears, MD   Chief Complaint  Patient presents with   Follow-up    Incomplete testing pt would like to address cardiac rehab. Meds reviewed verbally with pt.      History of Present Illness: Larry Kent is a 77 y.o. male who is here today for a follow-up visit regarding carotid disease, coronary artery disease and mild aortic stenosis.   The patient is status post right carotid endarterectomy in June 2021 for severe asymptomatic stenosis. He has chronic medical conditions that include hyperlipidemia, aortic stenosis, prostate cancer and lung nodules with pathology showing neuroendocrine carcinoma.  He presented in March to the cancer center with chest pain and shortness of breath.  EKG showed evidence of inferior ST elevation.  He was transferred to [facility] and underwent emergent cardiac catheterization which showed thrombotic occlusion of the right coronary artery which was treated with 4 overlapped drug-eluting stents.  There was 50% distal left main stenosis, 60% proximal left circumflex stenosis and 60% mid LAD stenosis.  Echocardiogram showed normal LV systolic function.  He reports no recurrent chest pain but continues to have significant exertional dyspnea and palpitations.  He is still getting chemotherapy and still significantly anemic.  Past Medical History:  Diagnosis Date   Allergy    Arthritis    both knees    BCC (basal cell carcinoma), arm, right 09/2019   MOHS (Mitkov)   BCC (basal cell carcinoma), face 2014   L preauricular s/p MOHS   BCC (basal cell carcinoma), face 02/2018   L upper lip   BPH (benign prostatic hypertrophy)    Dr. Mena Goes @ [facility]   Carotid stenosis 05/2015   RICA 40-59%, LICA 1-39%, L vertebral occlusion, rpt 1 yr   Cataract    removed both eyes    FHx: colon cancer     FHx: prostate cancer    Heart murmur    mild aortic stenosis    History of kidney stones    Hyperlipidemia    borderline- on rosuvastatin now normal    Occlusion of right vertebral artery 05/2015    Past Surgical History:  Procedure Laterality Date   BASAL CELL CARCINOMA EXCISION  05/2018   lip, 2020 x3  basal cells removed    CATARACT EXTRACTION  2008   Left   CATARACT EXTRACTION  2013   Right (Eppes)   COLONOSCOPY  ~2010   medium int hemorrhoids, o/w WNL, rpt 5 yrs given fmhx (Dr. Randa Evens)   COLONOSCOPY  08/2019   TAs, diverticulosis, rpt 3 yrs (Armbruster)   CORONARY/GRAFT ACUTE MI REVASCULARIZATION N/A 09/07/2022   Procedure: Coronary/Graft Acute MI Revascularization;  Surgeon: Orbie Pyo, MD;  [facility];  Service: Cardiovascular;  Laterality: N/A;   ENDARTERECTOMY Right 12/01/2019   Procedure: RIGHT ENDARTERECTOMY CAROTID;  Surgeon: Cephus Shelling, MD;  Location: [facility] OR;  Service: Vascular;  Laterality: Right;   exercise treadmill  11/2005   WNL Eldridge Dace)   FINE NEEDLE ASPIRATION  07/28/2022   Procedure: FINE NEEDLE ASPIRATION (FNA) LINEAR;  Surgeon: Josephine Igo, DO;  [facility];  Service: Pulmonary;;   HERNIA REPAIR  2008   Right   IR IMAGING GUIDED PORT INSERTION  08/11/2022   IR IMAGING GUIDED PORT INSERTION  10/08/2022   IR REMOVAL TUN ACCESS  W/ PORT W/O FL MOD SED  09/10/2022   LEFT HEART CATH AND CORONARY ANGIOGRAPHY N/A 09/07/2022   Procedure: LEFT HEART CATH AND CORONARY ANGIOGRAPHY;  Surgeon: Orbie Pyo, MD;  [facility];  Service: Cardiovascular;  Laterality: N/A;   MOHS SURGERY Left spring 2014   basal cell face   PATCH ANGIOPLASTY Right 12/01/2019   Procedure: PATCH ANGIOPLASTY USING Livia Snellen BIOLOGIC PATCH;  Surgeon: Cephus Shelling, MD;  Location: [facility] OR;  Service: Vascular;  Laterality: Right;   Testicular Biopsy  2003   benign, varicocele   US ECHOCARDIOGRAPHY  11/2005   aortic sclerosis,  EF 55-60%, diastolic dysfunction   VIDEO BRONCHOSCOPY WITH ENDOBRONCHIAL ULTRASOUND Bilateral 07/28/2022   Procedure: VIDEO BRONCHOSCOPY WITH ENDOBRONCHIAL ULTRASOUND;  Surgeon: Josephine Igo, DO;  [facility];  Service: Pulmonary;  Laterality: Bilateral;     Current Outpatient Medications  Medication Sig Dispense Refill   acetaminophen (TYLENOL) 500 MG tablet Take 1,000 mg by mouth every 6 (six) hours as needed for mild pain or headache.     aspirin 81 MG chewable tablet Chew 1 tablet (81 mg total) by mouth daily. 30 tablet 11   Calcium Carb-Cholecalciferol (RA CALCIUM PLUS VITAMIN D) 600-10 MG-MCG TABS Take 600 mg by mouth daily.     fluticasone (FLONASE) 50 MCG/ACT nasal spray Place 1 spray into both nostrils daily as needed for allergies or rhinitis.     lidocaine-prilocaine (EMLA) cream Apply to the Port-A-Cath site 30-60 minutes before chemotherapy treatment (Patient taking differently: Apply 1 Application topically See admin instructions. Apply to the Port-A-Cath site 30-60 minutes before chemotherapy treatment) 30 g 0   metoprolol tartrate (LOPRESSOR) 25 MG tablet Take 0.5 tablets (12.5 mg total) by mouth 2 (two) times daily. 30 tablet 11   Multiple Vitamin (MULTIVITAMIN) tablet Take 1 tablet by mouth daily with breakfast.     prochlorperazine (COMPAZINE) 10 MG tablet Take 1 tablet (10 mg total) by mouth every 6 (six) hours as needed for nausea or vomiting. 30 tablet 0   rosuvastatin (CRESTOR) 20 MG tablet TAKE 1 TABLET BY MOUTH EVERY DAY (Patient taking differently: Take 20 mg by mouth in the morning.) 90 tablet 1   SYSTANE ULTRA PF 0.4-0.3 % SOLN Place 1 drop into both eyes 3 (three) times daily as needed (for dryness/irritation). 10 each 1   tamsulosin (FLOMAX) 0.4 MG CAPS capsule Take 0.4 mg by mouth 2 (two) times daily.     ticagrelor (BRILINTA) 90 MG TABS tablet Take 1 tablet (90 mg total) by mouth 2 (two) times daily. 60 tablet 11   Zinc 25 MG TABS Take 25 mg by mouth  daily.     abiraterone acetate (ZYTIGA) 250 MG tablet Take 1,000 mg by mouth daily. (Patient not taking: Reported on 10/27/2022)     predniSONE (DELTASONE) 5 MG tablet Take 5 mg by mouth daily. (Patient not taking: Reported on 10/27/2022)     No current facility-administered medications for this visit.    Allergies:   Patient has no known allergies.    Social History:  The patient  reports that he has never smoked. He has never used smokeless tobacco. He reports that he does not currently use alcohol. He reports that he does not use drugs.   Family History:  The patient's family history includes Breast cancer in his sister; Cancer (age of onset: 66) in his sister; Cancer (age of onset: 82) in his paternal grandfather; Cancer (age of onset: 56) in his  father; Cancer (age of onset: 58) in his mother; Colon cancer in his maternal grandmother and mother; Coronary artery disease in his maternal uncle; Coronary artery disease (age of onset: 30) in his father; Hypertension in his mother; Prostate cancer in his father and paternal grandfather; Stroke in his mother.    ROS:  Please see the history of present illness.   Otherwise, review of systems are positive for none.   All other systems are reviewed and negative.    PHYSICAL EXAM: VS:  BP 110/60 (BP Location: Left Arm, Patient Position: Sitting, Cuff Size: Normal)   Pulse 79   Ht 5\' 9"  (1.753 m)   Wt 173 lb 8 oz (78.7 kg)   SpO2 98%   BMI 25.62 kg/m  , BMI Body mass index is 25.62 kg/m. GEN: Well nourished, well developed, in no acute distress  HEENT: normal  Neck: no JVD or masses.  Left carotid bruit.   Cardiac: RRR; no  rubs, or gallops,no edema .  2 / 6 systolic murmur in the aortic area which is early to mid peaking. Respiratory:  clear to auscultation bilaterally, normal work of breathing GI: soft, nontender, nondistended, + BS MS: no deformity or atrophy  Skin: warm and dry, no rash Neuro:  Strength and sensation are intact Psych:  euthymic mood, full affect   EKG:  EKG is not ordered today.    Recent Labs: 09/15/2022: Magnesium 1.8 09/28/2022: TSH 0.999 10/26/2022: ALT 12; BUN 31; Creatinine 0.85; Hemoglobin 8.4; Platelet Count 154; Potassium 4.3; Sodium 138    Lipid Panel    Component Value Date/Time   CHOL 137 09/07/2022 1323   CHOL 128 09/12/2018 1118   TRIG 55 09/07/2022 1323   HDL 62 09/07/2022 1323   HDL 57 09/12/2018 1118   CHOLHDL 2.2 09/07/2022 1323   VLDL 11 09/07/2022 1323   LDLCALC 64 09/07/2022 1323   LDLCALC 57 09/12/2018 1118   LDLDIRECT 144.8 02/09/2012 0928      Wt Readings from Last 3 Encounters:  10/27/22 173 lb 8 oz (78.7 kg)  10/20/22 179 lb (81.2 kg)  10/19/22 176 lb 9.6 oz (80.1 kg)          08/02/2018    9:04 AM  PAD Screen  Previous PAD dx? No  Previous surgical procedure? No  Pain with walking? No  Feet/toe relief with dangling? No  Painful, non-healing ulcers? No  Extremities discolored? No      ASSESSMENT AND PLAN:  1.  Coronary artery disease involving native coronary arteries without angina: He is doing reasonably well after inferior STEMI with 4 overlapped drug-eluting stent placement to the right coronary artery.  Given borderline disease in the left main, left circumflex and LAD, I agree with the need for ischemic cardiac evaluation with PET scan. In addition, I referred the patient to cardiac rehab.  He appears to be significantly deconditioned. I need aspirin due to significant anemia.  Continue ticagrelor monotherapy.  2.  Carotid artery disease: Status post right carotid endarterectomy in 2021.  He is followed by [facility].  3.  Hyperlipidemia: Continue treatment with rosuvastatin.  Most recent lipid profile showed an LDL of 64.  4.  Aortic stenosis: This remains mild on most recent echocardiogram.  5.  Metastatic neuroendocrine carcinoma with advanced prostate cancer.  He continues to be significantly anemic with a hemoglobin in the 8 range.  I elected to  discontinue aspirin today.     Disposition:   FU with me in 3 months  Signed,  Lorine Bears, MD  10/27/2022 3:49 PM    [facility]

## 2022-10-27 NOTE — Patient Instructions (Signed)
Medication Instructions:  STOP the Aspirin   *If you need a refill on your cardiac medications before your next appointment, please call your pharmacy*   Lab Work: None ordered If you have labs (blood work) drawn today and your tests are completely normal, you will receive your results only by: MyChart Message (if you have MyChart) OR A paper copy in the mail If you have any lab test that is abnormal or we need to change your treatment, we will call you to review the results.   Testing/Procedures: None ordered   Follow-Up: At [facility], you and your health needs are our priority.  As part of our continuing mission to provide you with exceptional heart care, we have created designated Provider Care Teams.  These Care Teams include your primary Cardiologist (physician) and Advanced Practice Providers (APPs -  Physician Assistants and Nurse Practitioners) who all work together to provide you with the care you need, when you need it.  We recommend signing up for the patient portal called "MyChart".  Sign up information is provided on this After Visit Summary.  MyChart is used to connect with patients for Virtual Visits (Telemedicine).  Patients are able to view lab/test results, encounter notes, upcoming appointments, etc.  Non-urgent messages can be sent to your provider as well.   To learn more about what you can do with MyChart, go to [email or web address].    Your next appointment:   3 month(s)  Provider:   You may see Lorine Bears, MD or one of the following Advanced Practice Providers on your designated Care Team:   Nicolasa Ducking, NP Eula Listen, PA-C Cadence Fransico Michael, PA-C Charlsie Quest, NP    Other Instructions A referral has been placed to Cardiac Rehab

## 2022-10-29 ENCOUNTER — Encounter (HOSPITAL_COMMUNITY): Payer: Self-pay | Admitting: Emergency Medicine

## 2022-10-29 ENCOUNTER — Emergency Department (HOSPITAL_COMMUNITY): Payer: Medicare Other

## 2022-10-29 ENCOUNTER — Emergency Department (HOSPITAL_COMMUNITY)
Admission: EM | Admit: 2022-10-29 | Discharge: 2022-10-30 | Disposition: A | Discharge status: Home / Self Care | Payer: Medicare Other | Attending: Emergency Medicine | Admitting: Emergency Medicine

## 2022-10-29 ENCOUNTER — Other Ambulatory Visit: Payer: Self-pay

## 2022-10-29 DIAGNOSIS — M545 Low back pain, unspecified: Secondary | ICD-10-CM | POA: Diagnosis not present

## 2022-10-29 DIAGNOSIS — Z743 Need for continuous supervision: Secondary | ICD-10-CM | POA: Diagnosis not present

## 2022-10-29 DIAGNOSIS — R079 Chest pain, unspecified: Secondary | ICD-10-CM | POA: Insufficient documentation

## 2022-10-29 DIAGNOSIS — Z8546 Personal history of malignant neoplasm of prostate: Secondary | ICD-10-CM | POA: Insufficient documentation

## 2022-10-29 DIAGNOSIS — R0789 Other chest pain: Secondary | ICD-10-CM | POA: Diagnosis not present

## 2022-10-29 DIAGNOSIS — R778 Other specified abnormalities of plasma proteins: Secondary | ICD-10-CM | POA: Insufficient documentation

## 2022-10-29 DIAGNOSIS — H04123 Dry eye syndrome of bilateral lacrimal glands: Secondary | ICD-10-CM | POA: Diagnosis not present

## 2022-10-29 LAB — CBC WITH DIFFERENTIAL/PLATELET
Abs Immature Granulocytes: 0.25 10*3/uL — ABNORMAL HIGH (ref 0.00–0.07)
Basophils Absolute: 0.1 10*3/uL (ref 0.0–0.1)
Basophils Relative: 2 %
Eosinophils Absolute: 0 10*3/uL (ref 0.0–0.5)
Eosinophils Relative: 1 %
HCT: 23.3 % — ABNORMAL LOW (ref 39.0–52.0)
Hemoglobin: 7.6 g/dL — ABNORMAL LOW (ref 13.0–17.0)
Immature Granulocytes: 4 %
Lymphocytes Relative: 30 %
Lymphs Abs: 1.8 10*3/uL (ref 0.7–4.0)
MCH: 34.1 pg — ABNORMAL HIGH (ref 26.0–34.0)
MCHC: 32.6 g/dL (ref 30.0–36.0)
MCV: 104.5 fL — ABNORMAL HIGH (ref 80.0–100.0)
Monocytes Absolute: 0.9 10*3/uL (ref 0.1–1.0)
Monocytes Relative: 15 %
Neutro Abs: 2.9 10*3/uL (ref 1.7–7.7)
Neutrophils Relative %: 48 %
Platelets: 95 10*3/uL — ABNORMAL LOW (ref 150–400)
RBC: 2.23 MIL/uL — ABNORMAL LOW (ref 4.22–5.81)
RDW: 17.5 % — ABNORMAL HIGH (ref 11.5–15.5)
WBC: 5.9 10*3/uL (ref 4.0–10.5)
nRBC: 1.7 % — ABNORMAL HIGH (ref 0.0–0.2)

## 2022-10-29 LAB — COMPREHENSIVE METABOLIC PANEL
ALT: 21 U/L (ref 0–44)
AST: 25 U/L (ref 15–41)
Albumin: 3.3 g/dL — ABNORMAL LOW (ref 3.5–5.0)
Alkaline Phosphatase: 112 U/L (ref 38–126)
Anion gap: 7 (ref 5–15)
BUN: 25 mg/dL — ABNORMAL HIGH (ref 8–23)
CO2: 23 mmol/L (ref 22–32)
Calcium: 8.7 mg/dL — ABNORMAL LOW (ref 8.9–10.3)
Chloride: 106 mmol/L (ref 98–111)
Creatinine, Ser: 0.93 mg/dL (ref 0.61–1.24)
GFR, Estimated: >60 mL/min (ref >60)
Glucose, Bld: 97 mg/dL (ref 70–99)
Potassium: 3.4 mmol/L — ABNORMAL LOW (ref 3.5–5.1)
Sodium: 136 mmol/L (ref 135–145)
Total Bilirubin: 0.8 mg/dL (ref 0.3–1.2)
Total Protein: 6.3 g/dL — ABNORMAL LOW (ref 6.5–8.1)

## 2022-10-29 LAB — TROPONIN I (HIGH SENSITIVITY)
Troponin I (High Sensitivity): 21 ng/L — ABNORMAL HIGH (ref <18)
Troponin I (High Sensitivity): 21 ng/L — ABNORMAL HIGH (ref <18)

## 2022-10-29 MED ORDER — TIZANIDINE HCL 4 MG PO TABS
2.0000 mg | ORAL_TABLET | Freq: Once | ORAL | Status: AC
  Administered 2022-10-29: 2 mg via ORAL
  Filled 2022-10-29: qty 1

## 2022-10-29 NOTE — ED Triage Notes (Signed)
Pt BIB EMS, pt was working today and then went to the eye doctor and got his eyes dilated. Pt states he then had a car issue and stepped on the back of his truck bed, pt states he then felt tightness in his chest with discomfort to his lower back. Pt states the tightness felt like muscle pain. Hx of anxiety and pt states this may be anxiety. Pt had 4 stents earlier this year. Pt dx with prostate cancer in Feb with mets to the lungs. Pt took ASA prior to EMS arrival. EMS reports 12 lead showed NSR. 20 g L AC.

## 2022-10-29 NOTE — ED Provider Notes (Signed)
[place] EMERGENCY DEPARTMENT AT [facility] Provider Note  Medical Decision Making   HPI: Larry Kent is a 77 y.o. male with history perinent for HLD, prediabetes, CVA, recent STEMI in March 2024 status post stents X4, metastatic prostate cancer who presents complaining of chest pain and back pain. Patient arrived via EMS from home.  History provided by patient.  No interpreter required for this encounter.  Patient reports that this morning he was working as an Materials engineer an air conditioning unit, reports that he was sitting on a 5 gallon bucket for several hours when he thereafter developed right-sided muscular pain, worse with movement.  Reports that he went about his day, several hours later he was attempting to start his truck and had difficulty so he stepped up on the bumper of his truck and tried to jump up and down to get the truck to start, reports that thereafter he developed chest pain, he states that it felt like a muscular pain reaching across his chest, similar to what one could develop "after raking leaves in the yard" per his report.  No associated shortness of breath, diaphoresis, nausea, vomiting.  Reports that the episode of chest pain did not radiate to his chest, neck, back, he did not have any back pain concurrent with this.  Reports that the episode resolved spontaneously after approximately minutes, however he was concerned, therefore he presented to the emergency department for further evaluation.  ROS: As per HPI. Please see MAR for complete past medical history, surgical history, and social history.   Physical exam is pertinent for no focal findings.   The differential includes but is not limited to ACS, arrhythmia, pericardial tamponade, pericarditis, myocarditis, pneumonia, pneumothorax, esophageal, tear, perforated abdominal viscous, aortic dissection, costochondritis, musculoskeletal chest wall pain, GERD, muscular strain, sciatica, cauda  equina syndrome.  Additional history obtained from: Chart review External records from outside source obtained and reviewed including: Reviewed patient's recent discharge summary from 3/19 status post patient's STEMI.  ED provider interpretation of ECG: Sinus rate, regular rhythm, nonspecific T wave inversion in lead V1, no ST elevations or depressions  ED provider interpretation of radiology/imaging: X-ray without focal airspace opacification, cardiomediastinal silhouette derangement, pleural effusion, pneumothorax, bony displacement.  Labs ordered were interpreted by myself as well as my attending and were incorporated into the medical decision making process for this patient.  ED provider interpretation of labs: CBC without leukocytosis, stable anemia, stable thrombocytopenia.  CMP without AKI or emergent electrolyte derangement, troponin initially elevated at 21, delta stable at 21, this is significantly just decreased from patient most recent prior of greater than 7000 in March 2024.  Interventions: Tizanidine  See the EMR for full details regarding lab and imaging results.  Larry Kent is awake, alert, and HDS.  His exam is most notable for tenderness to palpation in right paraspinal musculature.  The patient's presentation, the patient being hemodynamically stable, and the ECG are not consistent with Pericardial Tamponade. The patient's pain is not positional. This in conjunction with the lack of PR depressions and ST elevations on the ECG are reassuring against Pericarditis. The patient's non-elevated troponin and ECG are also inconsistent with Myocarditis.  The CXR is unremarkable for focal airspace disease.  The patient is afebrile and denies productive cough.  Therefore, I do not suspect Pneumonia. There is no evidence of Pneumothorax on physical exam or on the CXR. CXR shows no evidence of Esophageal Tear and there is no recent intractable emesis or esophageal instrumentation.  There  is no peritonitis or free air on CXR worrisome for a Perforated Abdominal Viscous.  The patient's pain is not tearing and it does not radiate to back. Pulses are present bilaterally in both the upper and lower extremities. CXR does not show a widened mediastinum. I have a very low suspicion for Aortic Dissection.   The ECG reveals no anatomical ischemia representing STEMI, New-Onset Arrhythmia, or ischemic equivalent.  Does have an elevated hear score of 5, initial troponin is elevated at 21, delta is also elevated at 21.  Discussed this extensively with patient at the bedside, discussed demand ischemia, overall feel that patient's symptoms are less concerning for NSTEMI or STEMI, though patient does have concerning history.  Given patient had brief episode that resolved spontaneously, troponins are not increasing, patient had a strong preference for discharge with outpatient follow-up, do not believe that discharge is unreasonable, patient reports that he has 90-day follow-up with his cardiologist, do feel the patient needs sooner follow-up, therefore referral placed for sooner cardiology open patient reports that he will call the cardiology office in the a.m.   The patient had an episode of back pain after sitting on a 5 gallon bucket for several hours, it is precipitated by movement and tender to palpation, patient has no midline tenderness, no warning signs concerning for cauda equina including bowel or bladder incontinence or paresthesias, midline tenderness, recent falls or trauma, do not feel that patient requires any further workup for this at this time, given dose of tizanidine in the ED, and patient reports that this significantly improved his pain, will prescribe short outpatient course of low-dose tizanidine.   Consults: Not indicated  Disposition: DISCHARGE: I believe that the patient is safe for discharge home with outpatient follow-up. Patient was informed of all pertinent physical exam,  laboratory, and imaging findings. Patient's suspected etiology of their symptom presentation was discussed with the patient and all questions were answered. We discussed following up with PCP and cardiology. I provided thorough ED return precautions. The patient feels safe and comfortable with this plan.  The plan for this patient was discussed with Dr. Jeraldine Loots, who voiced agreement and who oversaw evaluation and treatment of this patient.  Clinical Impression:  1. Acute right-sided low back pain without sciatica    Discharge  Therapies: These medications and interventions were provided for the patient while in the ED. Medications  tiZANidine (ZANAFLEX) tablet 2 mg (2 mg Oral Given 10/29/22 2017)    MDM generated using voice dictation software and may contain dictation errors.  Please contact me for any clarification or with any questions.  Clinical Complexity A medically appropriate history, review of systems, and physical exam was performed.  Collateral history obtained from: Chart review I personally reviewed the labs, EKG, imaging as discussed above. Patient's presentation is most consistent with acute complicated illness / injury requiring diagnostic workup Considered and ruled out life and body threatening conditions  Treatment: Outpatient follow-up Medications: Prescription Discussed patient's care with providers from the following different specialties: None  Physical Exam   ED Triage Vitals [10/29/22 1848]  Enc Vitals Group     BP (!) 111/56     Pulse Rate 70     Resp 16     Temp 99 F (37.2 C)     Temp Source Oral     SpO2 99 %     Weight      Height      Head Circumference      Peak  Flow      Pain Score 2     Pain Loc      Pain Edu?      Excl. in GC?      Physical Exam Vitals and nursing note reviewed.  Constitutional:      General: He is not in acute distress.    Appearance: He is well-developed.  HENT:     Head: Normocephalic and atraumatic.  Eyes:      Conjunctiva/sclera: Conjunctivae normal.  Cardiovascular:     Rate and Rhythm: Normal rate and regular rhythm.     Pulses:          Radial pulses are 2+ on the right side and 2+ on the left side.     Heart sounds: No murmur heard. Pulmonary:     Effort: Pulmonary effort is normal. No respiratory distress.     Breath sounds: Normal breath sounds.  Abdominal:     Palpations: Abdomen is soft.     Tenderness: There is no abdominal tenderness.  Musculoskeletal:        General: No swelling.     Cervical back: Neck supple.       Back:  Skin:    General: Skin is warm and dry.     Capillary Refill: Capillary refill takes less than 2 seconds.  Neurological:     Mental Status: He is alert.  Psychiatric:        Mood and Affect: Mood normal.       Procedure Note  Procedures  DG Chest 2 View  Final Result      Julianne Rice, MD Emergency Medicine, PGY-2   Curley Spice, MD 10/30/22 Eustace Quail    Gerhard Munch, MD 10/30/22 2127

## 2022-10-30 ENCOUNTER — Other Ambulatory Visit (HOSPITAL_COMMUNITY): Payer: Self-pay

## 2022-10-30 MED ORDER — TIZANIDINE HCL 4 MG PO TABS: 2.0000 mg | ORAL_TABLET | Freq: Three times a day (TID) | ORAL | 0 refills | Status: AC | PRN

## 2022-10-30 NOTE — Discharge Instructions (Addendum)
Larry Kent  Thank you for allowing [place] to take care of you today.  You came to the Emergency Department today because you had an episode of back pain, you also had an episode of chest pain after jumping on the back of your truck.  We did your heart number twice, and it was mildly elevated at 21 both times, normal is 18 or less, overall it is concerning that it is a little bit elevated, we do believe that this is from your back, since it is not upgoing and your symptoms are resolved we do feel that you are safe to go home and follow-up with your cardiologist, however you need to follow-up with them sooner than 90 days, please call them tomorrow and schedule an appointment sometime for next week, we also put in a referral for you to see cardiology which should help take it faster.  The back pain is likely from where you are sitting on the 5 gallon bucket, we are giving you a prescription for tizanidine, you can take half a tablet (2 mg) up to every 8 hours as needed for muscular pain in your back.  We believe that your back pain is separate from the chest pain, but if you are having an episode of chest pain, Devineni reason come back to the emergency department, not taken tizanidine.Marland Kitchen  To-Do: 1. Please follow-up with your primary doctor within 2 days / as soon as possible.   Please return to the Emergency Department or call 911 if you experience have worsening of your symptoms, or do not get better, chest pain, shortness of breath, severe or significantly worsening pain, high fever, severe confusion, pass out or have any reason to think that you need emergency medical care.   We hope you feel better soon.   Department of Emergency Medicine [facility]

## 2022-11-02 ENCOUNTER — Other Ambulatory Visit: Payer: Self-pay

## 2022-11-02 ENCOUNTER — Ambulatory Visit (HOSPITAL_COMMUNITY)
Admission: RE | Admit: 2022-11-02 | Discharge: 2022-11-02 | Disposition: A | Discharge status: Home / Self Care | Payer: Medicare Other | Source: Ambulatory Visit | Attending: Internal Medicine | Admitting: Internal Medicine

## 2022-11-02 ENCOUNTER — Inpatient Hospital Stay: Payer: Medicare Other | Attending: Nurse Practitioner

## 2022-11-02 ENCOUNTER — Telehealth: Payer: Self-pay

## 2022-11-02 DIAGNOSIS — Z79899 Other long term (current) drug therapy: Secondary | ICD-10-CM | POA: Insufficient documentation

## 2022-11-02 DIAGNOSIS — C3490 Malignant neoplasm of unspecified part of unspecified bronchus or lung: Secondary | ICD-10-CM | POA: Insufficient documentation

## 2022-11-02 DIAGNOSIS — R918 Other nonspecific abnormal finding of lung field: Secondary | ICD-10-CM | POA: Insufficient documentation

## 2022-11-02 DIAGNOSIS — C61 Malignant neoplasm of prostate: Secondary | ICD-10-CM | POA: Insufficient documentation

## 2022-11-02 DIAGNOSIS — Z5111 Encounter for antineoplastic chemotherapy: Secondary | ICD-10-CM | POA: Insufficient documentation

## 2022-11-02 DIAGNOSIS — K5730 Diverticulosis of large intestine without perforation or abscess without bleeding: Secondary | ICD-10-CM | POA: Diagnosis not present

## 2022-11-02 DIAGNOSIS — C7A1 Malignant poorly differentiated neuroendocrine tumors: Secondary | ICD-10-CM

## 2022-11-02 LAB — CBC WITH DIFFERENTIAL (CANCER CENTER ONLY)
Abs Immature Granulocytes: 1.72 10*3/uL — ABNORMAL HIGH (ref 0.00–0.07)
Basophils Absolute: 0 10*3/uL (ref 0.0–0.1)
Basophils Relative: 0 %
Eosinophils Absolute: 0.1 10*3/uL (ref 0.0–0.5)
Eosinophils Relative: 0 %
HCT: 25 % — ABNORMAL LOW (ref 39.0–52.0)
Hemoglobin: 8.2 g/dL — ABNORMAL LOW (ref 13.0–17.0)
Immature Granulocytes: 9 %
Lymphocytes Relative: 10 %
Lymphs Abs: 2 10*3/uL (ref 0.7–4.0)
MCH: 35.2 pg — ABNORMAL HIGH (ref 26.0–34.0)
MCHC: 32.8 g/dL (ref 30.0–36.0)
MCV: 107.3 fL — ABNORMAL HIGH (ref 80.0–100.0)
Monocytes Absolute: 2 10*3/uL — ABNORMAL HIGH (ref 0.1–1.0)
Monocytes Relative: 11 %
Neutro Abs: 13.4 10*3/uL — ABNORMAL HIGH (ref 1.7–7.7)
Neutrophils Relative %: 70 %
Platelet Count: 78 10*3/uL — ABNORMAL LOW (ref 150–400)
RBC: 2.33 MIL/uL — ABNORMAL LOW (ref 4.22–5.81)
RDW: 19.2 % — ABNORMAL HIGH (ref 11.5–15.5)
Smear Review: NORMAL
WBC Count: 19.2 10*3/uL — ABNORMAL HIGH (ref 4.0–10.5)
nRBC: 0.8 % — ABNORMAL HIGH (ref 0.0–0.2)

## 2022-11-02 LAB — CMP (CANCER CENTER ONLY)
ALT: 15 U/L (ref 0–44)
AST: 21 U/L (ref 15–41)
Albumin: 3.7 g/dL (ref 3.5–5.0)
Alkaline Phosphatase: 169 U/L — ABNORMAL HIGH (ref 38–126)
Anion gap: 5 (ref 5–15)
BUN: 16 mg/dL (ref 8–23)
CO2: 28 mmol/L (ref 22–32)
Calcium: 9 mg/dL (ref 8.9–10.3)
Chloride: 106 mmol/L (ref 98–111)
Creatinine: 0.83 mg/dL (ref 0.61–1.24)
GFR, Estimated: >60 mL/min (ref >60)
Glucose, Bld: 132 mg/dL — ABNORMAL HIGH (ref 70–99)
Potassium: 3.6 mmol/L (ref 3.5–5.1)
Sodium: 139 mmol/L (ref 135–145)
Total Bilirubin: 0.3 mg/dL (ref 0.3–1.2)
Total Protein: 6.4 g/dL — ABNORMAL LOW (ref 6.5–8.1)

## 2022-11-02 LAB — GENETIC SCREENING ORDER

## 2022-11-02 LAB — SAMPLE TO BLOOD BANK

## 2022-11-02 MED ORDER — IOHEXOL 300 MG/ML  SOLN
100.0000 mL | Freq: Once | INTRAMUSCULAR | Status: AC | PRN
  Administered 2022-11-02: 100 mL via INTRAVENOUS

## 2022-11-02 NOTE — Telephone Encounter (Signed)
CRITICAL VALUE STICKER  CRITICAL VALUE: HGB 8.2 Patient has sample to blood bank  RECEIVER (on-site recipient of call): Stormee Duda P. LPN  DATE & TIME NOTIFIED: 11/02/22 3:12 pm  MESSENGER (representative from lab): Lauren  MD NOTIFIED: Salena Saner Heilingoetter, PA-C  TIME OF NOTIFICATION: 3:16pm

## 2022-11-03 ENCOUNTER — Other Ambulatory Visit: Payer: Medicare Other

## 2022-11-03 ENCOUNTER — Ambulatory Visit: Payer: Medicare Other | Admitting: Internal Medicine

## 2022-11-03 ENCOUNTER — Ambulatory Visit: Payer: Medicare Other

## 2022-11-06 NOTE — Progress Notes (Deleted)
Cardiology Clinic Note   Patient Name: Larry Kent Date of Encounter: 11/06/2022  Primary Care Provider:  [name], MD Primary Cardiologist:  Lorine Bears, MD  Patient Profile    Larry Kent 77 year old male presents to the clinic today for follow-up evaluation of his coronary artery disease.  Past Medical History    Past Medical History:  Diagnosis Date   Allergy    Arthritis    both knees    BCC (basal cell carcinoma), arm, right 09/2019   MOHS ([place])   BCC (basal cell carcinoma), face 2014   L preauricular s/p MOHS   BCC (basal cell carcinoma), face 02/2018   L upper lip   BPH (benign prostatic hypertrophy)    Dr. Mena Goes @ [facility]   Carotid stenosis 05/2015   RICA 40-59%, LICA 1-39%, L vertebral occlusion, rpt 1 yr   Cataract    removed both eyes    FHx: colon cancer    FHx: prostate cancer    Heart murmur    mild aortic stenosis    History of kidney stones    Hyperlipidemia    borderline- on rosuvastatin now normal    Occlusion of right vertebral artery 05/2015   Past Surgical History:  Procedure Laterality Date   BASAL CELL CARCINOMA EXCISION  05/2018   lip, 2020 x3  basal cells removed    CATARACT EXTRACTION  2008   Left   CATARACT EXTRACTION  2013   Right ([facility])   COLONOSCOPY  ~2010   medium int hemorrhoids, o/w WNL, rpt 5 yrs given fmhx (Dr. Randa Evens)   COLONOSCOPY  08/2019   TAs, diverticulosis, rpt 3 yrs ([facility])   CORONARY/GRAFT ACUTE MI REVASCULARIZATION N/A 09/07/2022   Procedure: Coronary/Graft Acute MI Revascularization;  Surgeon: Orbie Pyo, MD;  Location: [facility];  Service: Cardiovascular;  Laterality: N/A;   ENDARTERECTOMY Right 12/01/2019   Procedure: RIGHT ENDARTERECTOMY CAROTID;  Surgeon: Cephus Shelling, MD;  Location: [facility] OR;  Service: Vascular;  Laterality: Right;   exercise treadmill  11/2005   WNL Eldridge Dace)   FINE NEEDLE ASPIRATION  07/28/2022   Procedure: FINE NEEDLE ASPIRATION  (FNA) LINEAR;  Surgeon: Josephine Igo, DO;  Location: [facility];  Service: Pulmonary;;   HERNIA REPAIR  2008   Right   IR IMAGING GUIDED PORT INSERTION  08/11/2022   IR IMAGING GUIDED PORT INSERTION  10/08/2022   IR REMOVAL TUN ACCESS W/ PORT W/O FL MOD SED  09/10/2022   LEFT HEART CATH AND CORONARY ANGIOGRAPHY N/A 09/07/2022   Procedure: LEFT HEART CATH AND CORONARY ANGIOGRAPHY;  Surgeon: Orbie Pyo, MD;  Location: [facility];  Service: Cardiovascular;  Laterality: N/A;   MOHS SURGERY Left spring 2014   basal cell face   PATCH ANGIOPLASTY Right 12/01/2019   Procedure: PATCH ANGIOPLASTY USING Livia Snellen BIOLOGIC PATCH;  Surgeon: Cephus Shelling, MD;  Location: [facility] OR;  Service: Vascular;  Laterality: Right;   Testicular Biopsy  2003   benign, varicocele   US ECHOCARDIOGRAPHY  11/2005   aortic sclerosis, EF 55-60%, diastolic dysfunction   VIDEO BRONCHOSCOPY WITH ENDOBRONCHIAL ULTRASOUND Bilateral 07/28/2022   Procedure: VIDEO BRONCHOSCOPY WITH ENDOBRONCHIAL ULTRASOUND;  Surgeon: Josephine Igo, DO;  Location: [facility];  Service: Pulmonary;  Laterality: Bilateral;    Allergies  No Known Allergies  History of Present Illness    Larry Kent has a PMH of hyperlipidemia, prediabetes, CVA, coronary artery disease status post STEMI 3/24.  He  is status post stenting x 4.  His PMH also includes metastatic prostate cancer.  He was seen and evaluated in the emergency department for chest and back pain.  He had been installing and air conditioning unit.  He had been sitting on a 5 gallon bucket for several hours when he developed right-sided muscular pain which was worse with movement.  He continued to work for several hours.  He was jumping into the back of the truck bed when he developed chest pain.  He reported that it felt like muscular type pain but also continued across to his chest.  He denied associated shortness of breath, diaphoresis, N/V.  The episode resolved  spontaneously after minutes.  This was concerning for him so he presented to the emergency department.  His chest x-ray showed no acute changes.  His lab work was unremarkable.  His troponins were initially 21 and 21 on recheck.  Of note his troponin levels elevated to greater than 7000 in March 2024.  For his back discomfort he received a low-dose of tizanidine.  He was instructed to follow-up with cardiology and discharged in stable condition.  He presents to the clinic today for follow-up evaluation and states***.  *** denies chest pain, shortness of breath, lower extremity edema, fatigue, palpitations, melena, hematuria, hemoptysis, diaphoresis, weakness, presyncope, syncope, orthopnea, and PND.  Coronary artery disease-no chest pain today.  He is status post cardiac catheterization with PCI and 4 overlapping DES to his right coronary artery.  Echocardiogram showed normal LV systolic function.  Recent visit to the emergency department on 10/29/2022 was felt to be related to MSK pain. Continue current medical therapy Low-sodium diet  Hyperlipidemia-LDL 64. High-fiber diet Continue rosuvastatin Repeat fasting lipids and LFTs  Aortic stenosis-noted to be mild on echocardiogram 09/08/2022.  Mean gradient 14 mmHg. Plan for repeat echocardiogram 3/25.  Carotid artery disease-denies episodes of lightheadedness, presyncope or syncope.  He is status post right carotid endarterectomy 2021. Continue rosuvastatin Follows with vein and vascular surgery  Disposition: Follow-up with Dr. Kirke Corin or APP in 3-4 months.  Home Medications    Prior to Admission medications   Medication Sig Start Date End Date Taking? Authorizing Provider  abiraterone acetate (ZYTIGA) 250 MG tablet Take 1,000 mg by mouth daily. Patient not taking: Reported on 10/27/2022 10/01/22   Jerilee Field, MD  acetaminophen (TYLENOL) 500 MG tablet Take 1,000 mg by mouth every 6 (six) hours as needed for mild pain or headache.     [provider]  Calcium Carb-Cholecalciferol (RA CALCIUM PLUS VITAMIN D) 600-10 MG-MCG TABS Take 600 mg by mouth daily.    [provider]  fluticasone (FLONASE) 50 MCG/ACT nasal spray Place 1 spray into both nostrils daily as needed for allergies or rhinitis.    [provider]  lidocaine-prilocaine (EMLA) cream Apply to the Port-A-Cath site 30-60 minutes before chemotherapy treatment Patient taking differently: Apply 1 Application topically See admin instructions. Apply to the Port-A-Cath site 30-60 minutes before chemotherapy treatment 08/03/22   Si Gaul, MD  metoprolol tartrate (LOPRESSOR) 25 MG tablet Take 0.5 tablets (12.5 mg total) by mouth 2 (two) times daily. 09/15/22 09/15/23  Pokhrel, Rebekah Chesterfield, MD  Multiple Vitamin (MULTIVITAMIN) tablet Take 1 tablet by mouth daily with breakfast.    [provider]  predniSONE (DELTASONE) 5 MG tablet Take 5 mg by mouth daily. Patient not taking: Reported on 10/27/2022 10/01/22   [provider]  prochlorperazine (COMPAZINE) 10 MG tablet Take 1 tablet (10 mg total) by mouth  every 6 (six) hours as needed for nausea or vomiting. 10/12/22   Si Gaul, MD  rosuvastatin (CRESTOR) 20 MG tablet TAKE 1 TABLET BY MOUTH EVERY DAY Patient taking differently: Take 20 mg by mouth in the morning. 04/28/22   [name], MD  SYSTANE ULTRA PF 0.4-0.3 % SOLN Place 1 drop into both eyes 3 (three) times daily as needed (for dryness/irritation). 10/13/22   [name], MD  tamsulosin (FLOMAX) 0.4 MG CAPS capsule Take 0.4 mg by mouth 2 (two) times daily.    [provider]  ticagrelor (BRILINTA) 90 MG TABS tablet Take 1 tablet (90 mg total) by mouth 2 (two) times daily. 09/15/22 09/15/23  Pokhrel, Rebekah Chesterfield, MD  tiZANidine (ZANAFLEX) 4 MG tablet Take 0.5 tablets (2 mg total) by mouth every 8 (eight) hours as needed for up to 10 days for muscle spasms. 10/30/22 11/09/22  Curley Spice, MD  Zinc 25 MG TABS Take 25  mg by mouth daily.    [provider]    Family History    Family History  Problem Relation Age of Onset   Cancer Father 37       prostate   Coronary artery disease Father 32       MI   Prostate cancer Father    Cancer Paternal Grandfather 32       prostate   Prostate cancer Paternal Grandfather    Stroke Mother    Hypertension Mother    Cancer Mother 47       colon   Colon cancer Mother    Coronary artery disease Maternal Uncle    Cancer Sister 96       breast   Breast cancer Sister    Colon cancer Maternal Grandmother    Diabetes Neg Hx    Colon polyps Neg Hx    Esophageal cancer Neg Hx    Rectal cancer Neg Hx    Stomach cancer Neg Hx    He indicated that his mother is deceased. He indicated that his father is deceased. He indicated that the status of his sister is unknown. He indicated that his maternal grandmother is deceased. He indicated that his maternal grandfather is deceased. He indicated that his paternal grandmother is deceased. He indicated that his paternal grandfather is deceased. He indicated that the status of his maternal uncle is unknown. He indicated that the status of his neg hx is unknown.  Social History    Social History   Socioeconomic History   Marital status: Married    Spouse name: Not on file   Number of children: Not on file   Years of education: Not on file   Highest education level: Not on file  Occupational History   Not on file  Tobacco Use   Smoking status: Never   Smokeless tobacco: Never  Vaping Use   Vaping Use: Never used  Substance and Sexual Activity   Alcohol use: Not Currently    Comment: rare   Drug use: No   Sexual activity: Not on file  Other Topics Concern   Not on file  Social History Narrative   Caffeine: 2-3 cups coffee/day   Lives with wife and adult son, 1 dog   Occupation: Warden/ranger   Edu: college   Activity: golf, no regular exercise, occasionally walks with wife   Diet: good amt  water, daily fruits/vegetables, fish several times a week   Social Determinants of Health   Financial Resource Strain: Low Risk  (  10/06/2022)   Overall Financial Resource Strain (CARDIA)    Difficulty of Paying Living Expenses: Not hard at all  Food Insecurity: No Food Insecurity (10/06/2022)   Hunger Vital Sign    Worried About Running Out of Food in the Last Year: Never true    Ran Out of Food in the Last Year: Never true  Transportation Needs: No Transportation Needs (10/06/2022)   PRAPARE - Administrator, Civil Service (Medical): No    Lack of Transportation (Non-Medical): No  Physical Activity: Inactive (10/06/2022)   Exercise Vital Sign    Days of Exercise per Week: 0 days    Minutes of Exercise per Session: 0 min  Stress: No Stress Concern Present (10/06/2022)   [employer] of Occupational Health - Occupational Stress Questionnaire    Feeling of Stress : Not at all  Social Connections: Moderately Integrated (10/06/2022)   Social Connection and Isolation Panel [NHANES]    Frequency of Communication with Friends and Family: More than three times a week    Frequency of Social Gatherings with Friends and Family: Three times a week    Attends Religious Services: More than 4 times per year    Active Member of Clubs or Organizations: No    Attends Banker Meetings: Never    Marital Status: Married  Catering manager Violence: Not At Risk (10/06/2022)   Humiliation, Afraid, Rape, and Kick questionnaire    Fear of Current or Ex-Partner: No    Emotionally Abused: No    Physically Abused: No    Sexually Abused: No     Review of Systems    General:  No chills, fever, night sweats or weight changes.  Cardiovascular:  No chest pain, dyspnea on exertion, edema, orthopnea, palpitations, paroxysmal nocturnal dyspnea. Dermatological: No rash, lesions/masses Respiratory: No cough, dyspnea Urologic: No hematuria, dysuria Abdominal:   No nausea, vomiting, diarrhea,  bright red blood per rectum, melena, or hematemesis Neurologic:  No visual changes, wkns, changes in mental status. All other systems reviewed and are otherwise negative except as noted above.  Physical Exam    VS:  There were no vitals taken for this visit. , BMI There is no height or weight on file to calculate BMI. GEN: Well nourished, well developed, in no acute distress. HEENT: normal. Neck: Supple, no JVD, carotid bruits, or masses. Cardiac: RRR, no murmurs, rubs, or gallops. No clubbing, cyanosis, edema.  Radials/DP/PT 2+ and equal bilaterally.  Respiratory:  Respirations regular and unlabored, clear to auscultation bilaterally. GI: Soft, nontender, nondistended, BS + x 4. MS: no deformity or atrophy. Skin: warm and dry, no rash. Neuro:  Strength and sensation are intact. Psych: Normal affect.  Accessory Clinical Findings    Recent Labs: 09/15/2022: Magnesium 1.8 09/28/2022: TSH 0.999 11/02/2022: ALT 15; BUN 16; Creatinine 0.83; Hemoglobin 8.2; Platelet Count 78; Potassium 3.6; Sodium 139   Recent Lipid Panel    Component Value Date/Time   CHOL 137 09/07/2022 1323   CHOL 128 09/12/2018 1118   TRIG 55 09/07/2022 1323   HDL 62 09/07/2022 1323   HDL 57 09/12/2018 1118   CHOLHDL 2.2 09/07/2022 1323   VLDL 11 09/07/2022 1323   LDLCALC 64 09/07/2022 1323   LDLCALC 57 09/12/2018 1118   LDLDIRECT 144.8 02/09/2012 0928    No BP recorded.  {Refresh Note OR Click here to enter BP  :1}***    ECG personally reviewed by me today- *** - No acute changes  Echocardiogram 09/08/2022 Sonographer:  Milda Smart  Referring Phys: [id number] Orbie Pyo     Sonographer Comments: Image acquisition challenging due to patient body  habitus and Image acquisition challenging due to respiratory motion.  IMPRESSIONS     1. Left ventricular ejection fraction, by estimation, is 65 to 70%. The  left ventricle has normal function. The left ventricle demonstrates  regional wall motion  abnormalities (see scoring diagram/findings for  description). There is moderate concentric  left ventricular hypertrophy. Left ventricular diastolic parameters are  indeterminate.   2. Right ventricular systolic function is normal. The right ventricular  size is moderately enlarged. There is normal pulmonary artery systolic  pressure.   3. Right atrial size was mildly dilated.   4. The mitral valve is degenerative. No evidence of mitral valve  regurgitation. No evidence of mitral stenosis. The mean mitral valve  gradient is 3.0 mmHg with average heart rate of 66 bpm. Severe mitral  annular calcification.   5. Mixed valve disease. Central aortic regurgitation, normal LV stroke  volume index. The aortic valve is calcified. Aortic valve regurgitation is  mild to moderate. Mild aortic valve stenosis. Aortic valve mean gradient  measures 14.0 mmHg.   6. The inferior vena cava is normal in size with greater than 50%  respiratory variability, suggesting right atrial pressure of 3 mmHg.   Comparison(s): Prior images reviewed side by side. Similar aortic  findings. LV wall motion comparison is difficult- WMA seen best on  contrast images, prior contrast images not performed.   FINDINGS   Left Ventricle: Left ventricular ejection fraction, by estimation, is 65  to 70%. The left ventricle has normal function. The left ventricle  demonstrates regional wall motion abnormalities. The left ventricular  internal cavity size was normal in size.  There is moderate concentric left ventricular hypertrophy. Left  ventricular diastolic parameters are indeterminate.     LV Wall Scoring:  The mid and distal anterior septum and mid inferoseptal segment are  hypokinetic.   Right Ventricle: The right ventricular size is moderately enlarged. No  increase in right ventricular wall thickness. Right ventricular systolic  function is normal. There is normal pulmonary artery systolic pressure.  The tricuspid  regurgitant velocity is  2.00 m/s, and with an assumed right atrial pressure of 3 mmHg, the  estimated right ventricular systolic pressure is 19.0 mmHg.   Left Atrium: Left atrial size was normal in size.   Right Atrium: Right atrial size was mildly dilated.   Pericardium: There is no evidence of pericardial effusion.   Mitral Valve: The mitral valve is degenerative in appearance. Severe  mitral annular calcification. No evidence of mitral valve regurgitation.  No evidence of mitral valve stenosis. MV peak gradient, 9.5 mmHg. The mean  mitral valve gradient is 3.0 mmHg  with average heart rate of 66 bpm.   Tricuspid Valve: The tricuspid valve is normal in structure. Tricuspid  valve regurgitation is trivial. No evidence of tricuspid stenosis.   Aortic Valve: Mixed valve disease. Central aortic regurgitation, normal LV  stroke volume index. The aortic valve is calcified. Aortic valve  regurgitation is mild to moderate. Aortic regurgitation PHT measures 651  msec. Mild aortic stenosis is present.  Aortic valve mean gradient measures 14.0 mmHg. Aortic valve peak gradient  measures 21.9 mmHg. Aortic valve area, by VTI measures 2.18 cm.   Pulmonic Valve: The pulmonic valve was not well visualized. Pulmonic valve  regurgitation is not visualized.   Aorta: The aortic root and ascending aorta are structurally normal,  with  no evidence of dilitation.   Venous: The inferior vena cava is normal in size with greater than 50%  respiratory variability, suggesting right atrial pressure of 3 mmHg.   IAS/Shunts: No atrial level shunt detected by color flow Doppler.    Cardiac catheterization 09/07/2022  Diagnostic Dominance: Right  Intervention      Assessment & Plan   1.  ***   Thomasene Ripple. Steven Veazie NP-C     11/06/2022, 8:00 AM [facility] [address] Office [phone] Fax [phone]    I spent***minutes examining this patient,  reviewing medications, and using patient centered shared decision making involving her cardiac care.  Prior to her visit I spent greater than 20 minutes reviewing her past medical history,  medications, and prior cardiac tests.

## 2022-11-09 ENCOUNTER — Encounter: Payer: Self-pay | Admitting: Internal Medicine

## 2022-11-09 ENCOUNTER — Ambulatory Visit: Payer: Medicare Other | Admitting: General Practice

## 2022-11-10 ENCOUNTER — Encounter: Payer: Self-pay | Admitting: Internal Medicine

## 2022-11-10 ENCOUNTER — Inpatient Hospital Stay: Payer: Medicare Other

## 2022-11-10 ENCOUNTER — Other Ambulatory Visit: Payer: Self-pay | Admitting: Medical Oncology

## 2022-11-10 ENCOUNTER — Other Ambulatory Visit: Payer: Self-pay

## 2022-11-10 ENCOUNTER — Encounter: Payer: Self-pay | Admitting: Medical Oncology

## 2022-11-10 ENCOUNTER — Inpatient Hospital Stay: Payer: Medicare Other | Admitting: Internal Medicine

## 2022-11-10 VITALS — BP 107/61 | HR 64 | Resp 18

## 2022-11-10 DIAGNOSIS — C7A1 Malignant poorly differentiated neuroendocrine tumors: Secondary | ICD-10-CM

## 2022-11-10 DIAGNOSIS — R918 Other nonspecific abnormal finding of lung field: Secondary | ICD-10-CM | POA: Diagnosis not present

## 2022-11-10 DIAGNOSIS — C3490 Malignant neoplasm of unspecified part of unspecified bronchus or lung: Secondary | ICD-10-CM | POA: Diagnosis not present

## 2022-11-10 DIAGNOSIS — Z79899 Other long term (current) drug therapy: Secondary | ICD-10-CM | POA: Diagnosis not present

## 2022-11-10 DIAGNOSIS — Z5111 Encounter for antineoplastic chemotherapy: Secondary | ICD-10-CM | POA: Diagnosis not present

## 2022-11-10 LAB — CBC WITH DIFFERENTIAL (CANCER CENTER ONLY)
Abs Immature Granulocytes: 0.1 10*3/uL — ABNORMAL HIGH (ref 0.00–0.07)
Basophils Absolute: 0.1 10*3/uL (ref 0.0–0.1)
Basophils Relative: 1 %
Eosinophils Absolute: 0.1 10*3/uL (ref 0.0–0.5)
Eosinophils Relative: 1 %
HCT: 30.4 % — ABNORMAL LOW (ref 39.0–52.0)
Hemoglobin: 10.1 g/dL — ABNORMAL LOW (ref 13.0–17.0)
Immature Granulocytes: 1 %
Lymphocytes Relative: 14 %
Lymphs Abs: 1.1 10*3/uL (ref 0.7–4.0)
MCH: 35.2 pg — ABNORMAL HIGH (ref 26.0–34.0)
MCHC: 33.2 g/dL (ref 30.0–36.0)
MCV: 105.9 fL — ABNORMAL HIGH (ref 80.0–100.0)
Monocytes Absolute: 1.2 10*3/uL — ABNORMAL HIGH (ref 0.1–1.0)
Monocytes Relative: 15 %
Neutro Abs: 5.5 10*3/uL (ref 1.7–7.7)
Neutrophils Relative %: 68 %
Platelet Count: 225 10*3/uL (ref 150–400)
RBC: 2.87 MIL/uL — ABNORMAL LOW (ref 4.22–5.81)
RDW: 19.8 % — ABNORMAL HIGH (ref 11.5–15.5)
WBC Count: 8.1 10*3/uL (ref 4.0–10.5)
nRBC: 0 % (ref 0.0–0.2)

## 2022-11-10 LAB — SAMPLE TO BLOOD BANK

## 2022-11-10 LAB — CMP (CANCER CENTER ONLY)
ALT: 13 U/L (ref 0–44)
AST: 17 U/L (ref 15–41)
Albumin: 3.9 g/dL (ref 3.5–5.0)
Alkaline Phosphatase: 126 U/L (ref 38–126)
Anion gap: 5 (ref 5–15)
BUN: 24 mg/dL — ABNORMAL HIGH (ref 8–23)
CO2: 28 mmol/L (ref 22–32)
Calcium: 9.3 mg/dL (ref 8.9–10.3)
Chloride: 106 mmol/L (ref 98–111)
Creatinine: 0.9 mg/dL (ref 0.61–1.24)
GFR, Estimated: >60 mL/min (ref >60)
Glucose, Bld: 112 mg/dL — ABNORMAL HIGH (ref 70–99)
Potassium: 3.9 mmol/L (ref 3.5–5.1)
Sodium: 139 mmol/L (ref 135–145)
Total Bilirubin: 0.4 mg/dL (ref 0.3–1.2)
Total Protein: 6.9 g/dL (ref 6.5–8.1)

## 2022-11-10 MED ORDER — HEPARIN SOD (PORK) LOCK FLUSH 100 UNIT/ML IV SOLN
500.0000 [IU] | Freq: Once | INTRAVENOUS | Status: AC | PRN
  Administered 2022-11-10: 500 [IU]

## 2022-11-10 MED ORDER — SODIUM CHLORIDE 0.9 % IV SOLN: Freq: Once | INTRAVENOUS | Status: AC

## 2022-11-10 MED ORDER — SODIUM CHLORIDE 0.9 % IV SOLN
1500.0000 mg | Freq: Once | INTRAVENOUS | Status: AC
  Administered 2022-11-10: 1500 mg via INTRAVENOUS
  Filled 2022-11-10: qty 30

## 2022-11-10 MED ORDER — SODIUM CHLORIDE 0.9% FLUSH
10.0000 mL | INTRAVENOUS | Status: DC | PRN
  Administered 2022-11-10: 10 mL

## 2022-11-10 NOTE — Patient Instructions (Signed)
[place] [facility]  Discharge Instructions: Thank you for choosing [facility] to provide your oncology and hematology care.   If you have a lab appointment with the Cancer Center, please go directly to the Cancer Center and check in at the registration area.   Wear comfortable clothing and clothing appropriate for easy access to any Portacath or PICC line.   We strive to give you quality time with your provider. You may need to reschedule your appointment if you arrive late (15 or more minutes).  Arriving late affects you and other patients whose appointments are after yours.  Also, if you miss three or more appointments without notifying the office, you may be dismissed from the clinic at the provider's discretion.      For prescription refill requests, have your pharmacy contact our office and allow 72 hours for refills to be completed.    Today you received the following chemotherapy and/or immunotherapy agents: Durvalumab      To help prevent nausea and vomiting after your treatment, we encourage you to take your nausea medication as directed.  BELOW ARE SYMPTOMS THAT SHOULD BE REPORTED IMMEDIATELY: *FEVER GREATER THAN 100.4 F (38 C) OR HIGHER *CHILLS OR SWEATING *NAUSEA AND VOMITING THAT IS NOT CONTROLLED WITH YOUR NAUSEA MEDICATION *UNUSUAL SHORTNESS OF BREATH *UNUSUAL BRUISING OR BLEEDING *URINARY PROBLEMS (pain or burning when urinating, or frequent urination) *BOWEL PROBLEMS (unusual diarrhea, constipation, pain near the anus) TENDERNESS IN MOUTH AND THROAT WITH OR WITHOUT PRESENCE OF ULCERS (sore throat, sores in mouth, or a toothache) UNUSUAL RASH, SWELLING OR PAIN  UNUSUAL VAGINAL DISCHARGE OR ITCHING   Items with * indicate a potential emergency and should be followed up as soon as possible or go to the Emergency Department if any problems should occur.  Please show the CHEMOTHERAPY ALERT CARD or IMMUNOTHERAPY ALERT CARD at  check-in to the Emergency Department and triage nurse.  Should you have questions after your visit or need to cancel or reschedule your appointment, please contact [facility]  Dept: [phone]  and follow the prompts.  Office hours are 8:00 a.m. to 4:30 p.m. Monday - Friday. Please note that voicemails left after 4:00 p.m. may not be returned until the following business day.  We are closed weekends and major holidays. You have access to a nurse at all times for urgent questions. Please call the main number to the clinic Dept: [phone] and follow the prompts.   For any non-urgent questions, you may also contact your provider using MyChart. We now offer e-Visits for anyone 18 and older to request care online for non-urgent symptoms. For details visit [email or web address].   Also download the MyChart app! Go to the app store, search "MyChart", open the app, select [facility], and log in with your MyChart username and password.   

## 2022-11-10 NOTE — Progress Notes (Signed)
Patient seen by Dr. Mohamed  Vitals are within treatment parameters.  Labs reviewed: and are within treatment parameters.  Per physician team, patient is ready for treatment and there are NO modifications to the treatment plan.  

## 2022-11-10 NOTE — Progress Notes (Signed)
[facility] [phone]   [phone]  OFFICE PROGRESS NOTE  Larry Boyden, MD [address] 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 Mena Goes   PRIOR THERAPY: None   CURRENT THERAPY:  Palliative systemic chemotherapy with carboplatin for AUC of 5 and Imfinzi 1500 Mg IV on day 1 as well as etoposide 100 Mg/M2 on days 1, 2 and 3 with Neulasta support on day 5. Status post 4 cycles. First dose on 08/11/22.  Starting from cycle #2 his carboplatin will be reduced to AUC of 4 and 2 etoposide 80 Mg/M2 secondary to intolerance and cycle #1.  Starting from cycle #5 he will be on maintenance treatment with single agent Imfinzi 1500 Mg IV every 4 weeks.  INTERVAL HISTORY: Larry Kent 77 y.o. male returns to the clinic today for follow-up visit accompanied by his son.  The patient is feeling fine today with no concerning complaints except for the baseline fatigue.  He denied having any current chest pain but has shortness of breath with exertion with mild cough and no hemoptysis.  He has no nausea, vomiting, diarrhea or constipation.  He has no headache or visual changes.  He denied having any fever or chills.  He has been tolerating his treatment fairly well.  The patient had repeat CT scan of the chest, abdomen and pelvis performed recently.  He is here today for evaluation and discussion of his scan results before starting the first dose of maintenance therapy.   MEDICAL HISTORY: Past Medical  History:  Diagnosis Date   Allergy    Arthritis    both knees    BCC (basal cell carcinoma), arm, right 09/2019   MOHS ([place])   BCC (basal cell carcinoma), face 2014   L preauricular s/p MOHS   BCC (basal cell carcinoma), face 02/2018   L upper lip   BPH (benign prostatic hypertrophy)    Dr. Mena Goes @ [facility]   Carotid stenosis 05/2015   RICA 40-59%, LICA 1-39%, L vertebral occlusion, rpt 1 yr   Cataract    removed both eyes    FHx: colon cancer    FHx: prostate cancer    Heart murmur    mild aortic stenosis    History of kidney stones    Hyperlipidemia    borderline- on rosuvastatin now normal    Occlusion of right vertebral artery 05/2015    ALLERGIES:  has No Known Allergies.  MEDICATIONS:  Current Outpatient Medications  Medication Sig Dispense Refill   abiraterone acetate (ZYTIGA) 250 MG tablet Take 1,000 mg by mouth daily.     predniSONE (DELTASONE) 5 MG tablet Take 5 mg by mouth daily.     acetaminophen (TYLENOL) 500 MG tablet Take 1,000 mg by mouth every 6 (six) hours as needed for mild pain or headache.     Calcium Carb-Cholecalciferol (RA CALCIUM PLUS VITAMIN D) 600-10 MG-MCG TABS Take 600 mg by mouth daily.     fluticasone (FLONASE) 50 MCG/ACT nasal spray Place 1 spray into both nostrils daily as needed for allergies or rhinitis.  lidocaine-prilocaine (EMLA) cream Apply to the Port-A-Cath site 30-60 minutes before chemotherapy treatment (Patient taking differently: Apply 1 Application topically See admin instructions. Apply to the Port-A-Cath site 30-60 minutes before chemotherapy treatment) 30 g 0   metoprolol tartrate (LOPRESSOR) 25 MG tablet Take 0.5 tablets (12.5 mg total) by mouth 2 (two) times daily. 30 tablet 11   Multiple Vitamin (MULTIVITAMIN) tablet Take 1 tablet by mouth daily with breakfast.     prochlorperazine (COMPAZINE) 10 MG tablet Take 1 tablet (10 mg total) by mouth every 6 (six) hours as needed for nausea or vomiting. 30 tablet 0    rosuvastatin (CRESTOR) 20 MG tablet TAKE 1 TABLET BY MOUTH EVERY DAY (Patient taking differently: Take 20 mg by mouth in the morning.) 90 tablet 1   SYSTANE ULTRA PF 0.4-0.3 % SOLN Place 1 drop into both eyes 3 (three) times daily as needed (for dryness/irritation). 10 each 1   tamsulosin (FLOMAX) 0.4 MG CAPS capsule Take 0.4 mg by mouth 2 (two) times daily.     ticagrelor (BRILINTA) 90 MG TABS tablet Take 1 tablet (90 mg total) by mouth 2 (two) times daily. 60 tablet 11   Zinc 25 MG TABS Take 25 mg by mouth daily.     No current facility-administered medications for this visit.    SURGICAL HISTORY:  Past Surgical History:  Procedure Laterality Date   BASAL CELL CARCINOMA EXCISION  05/2018   lip, 2020 x3  basal cells removed    CATARACT EXTRACTION  2008   Left   CATARACT EXTRACTION  2013   Right (Eppes)   COLONOSCOPY  ~2010   medium int hemorrhoids, o/w WNL, rpt 5 yrs given fmhx (Dr. Randa Evens)   COLONOSCOPY  08/2019   TAs, diverticulosis, rpt 3 yrs (Armbruster)   CORONARY/GRAFT ACUTE MI REVASCULARIZATION N/A 09/07/2022   Procedure: Coronary/Graft Acute MI Revascularization;  Surgeon: Orbie Pyo, MD;  Location: [facility];  Service: Cardiovascular;  Laterality: N/A;   ENDARTERECTOMY Right 12/01/2019   Procedure: RIGHT ENDARTERECTOMY CAROTID;  Surgeon: Cephus Shelling, MD;  Location: [facility] OR;  Service: Vascular;  Laterality: Right;   exercise treadmill  11/2005   WNL Eldridge Dace)   FINE NEEDLE ASPIRATION  07/28/2022   Procedure: FINE NEEDLE ASPIRATION (FNA) LINEAR;  Surgeon: Josephine Igo, DO;  Location: [facility] ENDOSCOPY;  Service: Pulmonary;;   HERNIA REPAIR  2008   Right   IR IMAGING GUIDED PORT INSERTION  08/11/2022   IR IMAGING GUIDED PORT INSERTION  10/08/2022   IR REMOVAL TUN ACCESS W/ PORT W/O FL MOD SED  09/10/2022   LEFT HEART CATH AND CORONARY ANGIOGRAPHY N/A 09/07/2022   Procedure: LEFT HEART CATH AND CORONARY ANGIOGRAPHY;  Surgeon: Orbie Pyo, MD;  Location: [facility]  INVASIVE CV LAB;  Service: Cardiovascular;  Laterality: N/A;   MOHS SURGERY Left spring 2014   basal cell face   PATCH ANGIOPLASTY Right 12/01/2019   Procedure: PATCH ANGIOPLASTY USING Livia Snellen BIOLOGIC PATCH;  Surgeon: Cephus Shelling, MD;  Location: [facility] OR;  Service: Vascular;  Laterality: Right;   Testicular Biopsy  2003   benign, varicocele   US ECHOCARDIOGRAPHY  11/2005   aortic sclerosis, EF 55-60%, diastolic dysfunction   VIDEO BRONCHOSCOPY WITH ENDOBRONCHIAL ULTRASOUND Bilateral 07/28/2022   Procedure: VIDEO BRONCHOSCOPY WITH ENDOBRONCHIAL ULTRASOUND;  Surgeon: Josephine Igo, DO;  Location: [facility] ENDOSCOPY;  Service: Pulmonary;  Laterality: Bilateral;    REVIEW OF SYSTEMS:  Constitutional: positive for fatigue Eyes: negative Ears, nose, mouth, throat, and face:  negative Respiratory: positive for dyspnea on exertion Cardiovascular: negative Gastrointestinal: negative Genitourinary:negative Integument/breast: negative Hematologic/lymphatic: negative Musculoskeletal:negative Neurological: negative Behavioral/Psych: negative Endocrine: negative Allergic/Immunologic: negative   PHYSICAL EXAMINATION: General appearance: alert, cooperative, fatigued, and no distress Head: Normocephalic, without obvious abnormality, atraumatic Neck: no adenopathy, no JVD, supple, symmetrical, trachea midline, and thyroid not enlarged, symmetric, no tenderness/mass/nodules Lymph nodes: Cervical, supraclavicular, and axillary nodes normal. Resp: clear to auscultation bilaterally Back: symmetric, no curvature. ROM normal. No CVA tenderness. Cardio: regular rate and rhythm, S1, S2 normal, no murmur, click, rub or gallop GI: soft, non-tender; bowel sounds normal; no masses,  no organomegaly Extremities: extremities normal, atraumatic, no cyanosis or edema Neurologic: Alert and oriented X 3, normal strength and tone. Normal symmetric reflexes. Normal coordination and gait  ECOG PERFORMANCE STATUS: 1  - Symptomatic but completely ambulatory  Blood pressure 125/72, pulse 73, temperature 98.3 F (36.8 C), temperature source Oral, resp. rate 19, weight 174 lb 1.6 oz (79 kg), SpO2 100 %.  LABORATORY DATA: Lab Results  Component Value Date   WBC 8.1 11/10/2022   HGB 10.1 (L) 11/10/2022   HCT 30.4 (L) 11/10/2022   MCV 105.9 (H) 11/10/2022   PLT 225 11/10/2022      Chemistry      Component Value Date/Time   NA 139 11/10/2022 0923   K 3.9 11/10/2022 0923   CL 106 11/10/2022 0923   CO2 28 11/10/2022 0923   BUN 24 (H) 11/10/2022 0923   CREATININE 0.90 11/10/2022 0923      Component Value Date/Time   CALCIUM 9.3 11/10/2022 0923   ALKPHOS 126 11/10/2022 0923   AST 17 11/10/2022 0923   ALT 13 11/10/2022 0923   BILITOT 0.4 11/10/2022 0923       RADIOGRAPHIC STUDIES: CT Chest W Contrast  Result Date: 11/05/2022 CLINICAL DATA:  Metastatic poorly differentiated carcinoma, neuroendocrine carcinoma of suspicious prostate primary versus primary lung cancer presented with innumerable and bilateral pulmonary nodules. Restaging. * Tracking Code: BO * EXAM: CT CHEST, ABDOMEN, AND PELVIS WITH CONTRAST TECHNIQUE: Multidetector CT imaging of the chest, abdomen and pelvis was performed following the standard protocol during bolus administration of intravenous contrast. RADIATION DOSE REDUCTION: This exam was performed according to the departmental dose-optimization program which includes automated exposure control, adjustment of the mA and/or kV according to patient size and/or use of iterative reconstruction technique. CONTRAST:  OMNIPAQUE IOHEXOL 300 MG/ML  SOLN COMPARISON:  09/17/2022 FINDINGS: CT CHEST FINDINGS Cardiovascular: The heart size is normal. No substantial pericardial effusion. Coronary artery calcification is evident. Mild atherosclerotic calcification is noted in the wall of the thoracic aorta. Mediastinum/Nodes: 16 mm short axis subcarinal lymph node identified previously is 14 mm  short axis today on image 27/3. There is no hilar lymphadenopathy. The esophagus has normal imaging features. There is no axillary lymphadenopathy. Lungs/Pleura: Numerous bilateral pulmonary nodules again noted. 15 x 14 mm posterior left upper lobe nodule measured previously is 13 x 11 mm today on 57/5. Index right middle lobe nodule measured previously at 16 x 12 mm is 15 x 9 mm today on 84/5. Inferior right middle lobe index nodule measured previously at 2 cm long axis is 14 cm long axis today on 95/5. Additional scattered smaller bilateral pulmonary nodules also appear generally stable to mildly decreased in the interval Musculoskeletal: 3.0 cm sclerotic lesion in the right aspect of the T12 vertebral body was markedly subtle on the previous study. The increase in sclerosis of this lesion suggests interval healing. CT ABDOMEN  PELVIS FINDINGS Hepatobiliary: Similar subtle nodularity of liver contour. No suspicious focal abnormality within the liver parenchyma. Gallbladder is decompressed. No intrahepatic or extrahepatic biliary dilation. Pancreas: No focal mass lesion. No dilatation of the main duct. No intraparenchymal cyst. No peripancreatic edema. Spleen: No splenomegaly. No focal mass lesion. Adrenals/Urinary Tract: No adrenal nodule or mass. Right kidney unremarkable. Tiny hypodensities in the lower left kidney are stable, compatible with cyst. No followup imaging is recommended. No evidence for hydroureter. Circumferential bladder wall thickening evident. Stomach/Bowel: Stomach is distended with food and fluid. Duodenum is normally positioned as is the ligament of Treitz. No small bowel wall thickening. No small bowel dilatation. The terminal ileum is normal. The appendix is not well visualized, but there is no edema or inflammation in the region of the cecum. No gross colonic mass. No colonic wall thickening. Diverticular changes are noted in the left colon without evidence of diverticulitis.  Vascular/Lymphatic: There is mild atherosclerotic calcification of the abdominal aorta without aneurysm. There is no gastrohepatic or hepatoduodenal ligament lymphadenopathy. No retroperitoneal or mesenteric lymphadenopathy. No pelvic sidewall lymphadenopathy. Reproductive: Heterogeneous enhancement noted in the prostate gland. Other: No intraperitoneal free fluid. Musculoskeletal: Sclerotic lesions in the inferior pubic rami are similar to prior with bilateral sclerotic lesions identified in the sacrum and lumbar spine. IMPRESSION: 1. Mild interval decrease in size of the subcarinal lymph node. No right hilar lymphadenopathy on today's study. 2. Numerous numerous bilateral pulmonary nodules again noted. Dominant index nodules measured previously have decreased in size in the interval with overall generalized appearance of stable to diffuse decrease in pulmonary nodularity. 3. Similar appearance of sclerotic lesions in the inferior pubic rami, sacrum, and lumbar spine. 4. Similar subtle nodularity of liver contour. Imaging features raise the question of cirrhosis. 5. Left colonic diverticulosis without diverticulitis. 6.  Aortic Atherosclerosis (ICD10-I70.0). Electronically Signed   By: Kennith Center M.D.   On: 11/05/2022 12:27   CT Abdomen Pelvis W Contrast  Result Date: 11/05/2022 CLINICAL DATA:  Metastatic poorly differentiated carcinoma, neuroendocrine carcinoma of suspicious prostate primary versus primary lung cancer presented with innumerable and bilateral pulmonary nodules. Restaging. * Tracking Code: BO * EXAM: CT CHEST, ABDOMEN, AND PELVIS WITH CONTRAST TECHNIQUE: Multidetector CT imaging of the chest, abdomen and pelvis was performed following the standard protocol during bolus administration of intravenous contrast. RADIATION DOSE REDUCTION: This exam was performed according to the departmental dose-optimization program which includes automated exposure control, adjustment of the mA and/or kV  according to patient size and/or use of iterative reconstruction technique. CONTRAST:  OMNIPAQUE IOHEXOL 300 MG/ML  SOLN COMPARISON:  09/17/2022 FINDINGS: CT CHEST FINDINGS Cardiovascular: The heart size is normal. No substantial pericardial effusion. Coronary artery calcification is evident. Mild atherosclerotic calcification is noted in the wall of the thoracic aorta. Mediastinum/Nodes: 16 mm short axis subcarinal lymph node identified previously is 14 mm short axis today on image 27/3. There is no hilar lymphadenopathy. The esophagus has normal imaging features. There is no axillary lymphadenopathy. Lungs/Pleura: Numerous bilateral pulmonary nodules again noted. 15 x 14 mm posterior left upper lobe nodule measured previously is 13 x 11 mm today on 57/5. Index right middle lobe nodule measured previously at 16 x 12 mm is 15 x 9 mm today on 84/5. Inferior right middle lobe index nodule measured previously at 2 cm long axis is 14 cm long axis today on 95/5. Additional scattered smaller bilateral pulmonary nodules also appear generally stable to mildly decreased in the interval Musculoskeletal: 3.0 cm sclerotic lesion  in the right aspect of the T12 vertebral body was markedly subtle on the previous study. The increase in sclerosis of this lesion suggests interval healing. CT ABDOMEN PELVIS FINDINGS Hepatobiliary: Similar subtle nodularity of liver contour. No suspicious focal abnormality within the liver parenchyma. Gallbladder is decompressed. No intrahepatic or extrahepatic biliary dilation. Pancreas: No focal mass lesion. No dilatation of the main duct. No intraparenchymal cyst. No peripancreatic edema. Spleen: No splenomegaly. No focal mass lesion. Adrenals/Urinary Tract: No adrenal nodule or mass. Right kidney unremarkable. Tiny hypodensities in the lower left kidney are stable, compatible with cyst. No followup imaging is recommended. No evidence for hydroureter. Circumferential bladder wall thickening  evident. Stomach/Bowel: Stomach is distended with food and fluid. Duodenum is normally positioned as is the ligament of Treitz. No small bowel wall thickening. No small bowel dilatation. The terminal ileum is normal. The appendix is not well visualized, but there is no edema or inflammation in the region of the cecum. No gross colonic mass. No colonic wall thickening. Diverticular changes are noted in the left colon without evidence of diverticulitis. Vascular/Lymphatic: There is mild atherosclerotic calcification of the abdominal aorta without aneurysm. There is no gastrohepatic or hepatoduodenal ligament lymphadenopathy. No retroperitoneal or mesenteric lymphadenopathy. No pelvic sidewall lymphadenopathy. Reproductive: Heterogeneous enhancement noted in the prostate gland. Other: No intraperitoneal free fluid. Musculoskeletal: Sclerotic lesions in the inferior pubic rami are similar to prior with bilateral sclerotic lesions identified in the sacrum and lumbar spine. IMPRESSION: 1. Mild interval decrease in size of the subcarinal lymph node. No right hilar lymphadenopathy on today's study. 2. Numerous numerous bilateral pulmonary nodules again noted. Dominant index nodules measured previously have decreased in size in the interval with overall generalized appearance of stable to diffuse decrease in pulmonary nodularity. 3. Similar appearance of sclerotic lesions in the inferior pubic rami, sacrum, and lumbar spine. 4. Similar subtle nodularity of liver contour. Imaging features raise the question of cirrhosis. 5. Left colonic diverticulosis without diverticulitis. 6.  Aortic Atherosclerosis (ICD10-I70.0). Electronically Signed   By: Kennith Center M.D.   On: 11/05/2022 12:27   DG Chest 2 View  Result Date: 10/29/2022 CLINICAL DATA:  Chest pain EXAM: CHEST - 2 VIEW COMPARISON:  X-ray 09/09/2022 FINDINGS: Eventration of the right hemidiaphragm. No consolidation, pneumothorax or effusion. No edema. Subtle nodular  appearance of the lungs but less prominent than on the previous. Please correlate with prior imaging including CT scan of 09/17/2022. Subtle interstitial component. Normal cardiopericardial silhouette. Calcified aorta. Left upper chest port with tip along the right atrium. Degenerative changes seen of the spine. IMPRESSION: Subtle nodularity to the lungs with interstitial changes. Chest port Electronically Signed   By: Karen Kays M.D.   On: 10/29/2022 20:06   VAS US CAROTID  Result Date: 10/20/2022 Carotid Arterial Duplex Study Patient Name:  Larry Kent  Date of Exam:   10/20/2022 Medical Rec #: [id number]        Accession #:    [id number] Date of Birth: [birth year]        Patient Gender: M Patient Age:   89 years Exam Location:  [facility] Procedure:      VAS US CAROTID Referring Phys: Sherald Hess --------------------------------------------------------------------------------  Indications:       Carotid artery disease. Risk Factors:      Hyperlipidemia. Other Factors:     Right CEA 12/01/19. Comparison Study:  09/23/21 prior RT normal LT 1-39% stenosis Performing Technologist: [place] Ponder RVS  Examination Guidelines: A complete evaluation includes  B-mode imaging, spectral Doppler, color Doppler, and power Doppler as needed of all accessible portions of each vessel. Bilateral testing is considered an integral part of a complete examination. Limited examinations for reoccurring indications may be performed as noted.  Right Carotid Findings: +----------+--------+--------+--------+------------------+--------+           PSV cm/sEDV cm/sStenosisPlaque DescriptionComments +----------+--------+--------+--------+------------------+--------+ CCA Prox  70      11              heterogenous               +----------+--------+--------+--------+------------------+--------+ CCA Distal69      15              heterogenous                +----------+--------+--------+--------+------------------+--------+ ICA Prox  76      21              heterogenous               +----------+--------+--------+--------+------------------+--------+ ICA Mid   81      24                                         +----------+--------+--------+--------+------------------+--------+ ICA Distal62      21                                         +----------+--------+--------+--------+------------------+--------+ ECA       112                                                +----------+--------+--------+--------+------------------+--------+ +----------+--------+-------+--------+-------------------+           PSV cm/sEDV cmsDescribeArm Pressure (mmHG) +----------+--------+-------+--------+-------------------+ Subclavian100                    140                 +----------+--------+-------+--------+-------------------+ +---------+--------+--+--------+--+---------+ VertebralPSV cm/s78EDV cm/s22Antegrade +---------+--------+--+--------+--+---------+  Left Carotid Findings: +----------+--------+--------+--------+------------------+--------+           PSV cm/sEDV cm/sStenosisPlaque DescriptionComments +----------+--------+--------+--------+------------------+--------+ CCA Prox  85      17              heterogenous               +----------+--------+--------+--------+------------------+--------+ CCA Distal76      16              heterogenous               +----------+--------+--------+--------+------------------+--------+ ICA Prox  97      24      1-39%   heterogenous               +----------+--------+--------+--------+------------------+--------+ ICA Mid   97      24                                         +----------+--------+--------+--------+------------------+--------+ ICA Distal73      21                                          +----------+--------+--------+--------+------------------+--------+  ECA       151                                                +----------+--------+--------+--------+------------------+--------+ +----------+--------+--------+--------+-------------------+           PSV cm/sEDV cm/sDescribeArm Pressure (mmHG) +----------+--------+--------+--------+-------------------+ [id number]                      141                 +----------+--------+--------+--------+-------------------+ +---------+--------+--+--------+-+---------+ VertebralPSV cm/s23EDV cm/s7Antegrade +---------+--------+--+--------+-+---------+   Summary: Right Carotid: There is no evidence of stenosis in the right ICA. Left Carotid: Velocities in the left ICA are consistent with a 1-39% stenosis. Vertebrals: Bilateral vertebral arteries demonstrate antegrade flow. *See table(s) above for measurements and observations.  Electronically signed by Sherald Hess MD on 10/20/2022 at 3:16:23 PM.    Final     ASSESSMENT AND PLAN: This is a very pleasant 77 years old white male with: 1) Metastatic poorly differentiated carcinoma, neuroendocrine carcinoma of suspicious prostate primary versus primary lung cancer presented with innumerable and bilateral pulmonary nodules in addition to right hilar and mediastinal lymphadenopathy and metastatic disease to several areas of the bone in addition to hypermetabolic activity in the prostate gland diagnosed in January 2024  The patient is currently undergoing systemic chemotherapy with carboplatin for AUC of 5 on day 1, Imfinzi 1500 Mg IV on day 1, etoposide 100 Mg/M2 on days 1, 2 and 3 with Neulasta support every 3 weeks status post 4 cycles.  Starting from cycle #5 the patient will be on maintenance treatment with Imfinzi 1500 Mg IV every 4 weeks.  He has been tolerating this treatment well with no concerning adverse effects. He had repeat CT scan of the chest, abdomen and pelvis  performed recently.  I personally and independently reviewed the scan and discussed the result with the patient and his son. His scan showed no concerning findings for disease progression and there was further improvement of his disease. I recommended for the patient to proceed with cycle #5 which will be the first cycle of his maintenance therapy with Imfinzi today. I will see him back for follow-up visit in 4 weeks for evaluation before the next cycle of his treatment. 2) prostate adenocarcinoma diagnosed in February 2024 currently managed by Dr. Mena Goes. The patient was advised to call immediately if he has any other concerning symptoms in the interval. The patient voices understanding of current disease status and treatment options and is in agreement with the current care plan.  All questions were answered. The patient knows to call the clinic with any problems, questions or concerns. We can certainly see the patient much sooner if necessary.  The total time spent in the appointment was 30 minutes.  Disclaimer: This note was dictated with voice recognition software. Similar sounding words can inadvertently be transcribed and may not be corrected upon review.

## 2022-11-11 ENCOUNTER — Encounter: Payer: Self-pay | Admitting: Medical Oncology

## 2022-11-11 ENCOUNTER — Other Ambulatory Visit (HOSPITAL_COMMUNITY): Payer: Self-pay

## 2022-11-11 NOTE — Progress Notes (Signed)
I mailed Larry Kent the May calendar from Support services.

## 2022-11-12 ENCOUNTER — Inpatient Hospital Stay: Payer: Medicare Other

## 2022-11-16 ENCOUNTER — Ambulatory Visit (INDEPENDENT_AMBULATORY_CARE_PROVIDER_SITE_OTHER): Payer: Medicare Other | Admitting: Infectious Disease

## 2022-11-16 ENCOUNTER — Encounter: Payer: Self-pay | Admitting: Infectious Disease

## 2022-11-16 ENCOUNTER — Other Ambulatory Visit: Payer: Self-pay

## 2022-11-16 VITALS — BP 121/73 | HR 69 | Temp 98.0°F | Wt 178.0 lb

## 2022-11-16 DIAGNOSIS — D709 Neutropenia, unspecified: Secondary | ICD-10-CM

## 2022-11-16 DIAGNOSIS — R7881 Bacteremia: Secondary | ICD-10-CM | POA: Diagnosis not present

## 2022-11-16 DIAGNOSIS — B952 Enterococcus as the cause of diseases classified elsewhere: Secondary | ICD-10-CM

## 2022-11-16 DIAGNOSIS — C61 Malignant neoplasm of prostate: Secondary | ICD-10-CM

## 2022-11-16 DIAGNOSIS — I2510 Atherosclerotic heart disease of native coronary artery without angina pectoris: Secondary | ICD-10-CM

## 2022-11-16 DIAGNOSIS — T80212D Local infection due to central venous catheter, subsequent encounter: Secondary | ICD-10-CM

## 2022-11-16 NOTE — Progress Notes (Signed)
Subjective:  Chief complaint: Follow-up for enterococcal bacteremia  Patient ID: Larry Kent, male    DOB: 09-21-1945, 77 y.o.   MRN: [id number]  HPI  77 y.o. 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 Source is suspected urinary with Enterococcu s faecalis growing 100,000 colony-forming units from urine and also in his blood.   He has now had port removed and central line holiday. He finished biotic course with high-dose amoxicillin.  He said he had a new port placed and is on immunotherapy along with hormonal therapy.    Past Medical History:  Diagnosis Date   Allergy    Arthritis    both knees    BCC (basal cell carcinoma), arm, right 09/2019   MOHS ([place])   BCC (basal cell carcinoma), face 2014   L preauricular s/p MOHS   BCC (basal cell carcinoma), face 02/2018   L upper lip   BPH (benign prostatic hypertrophy)    Dr. Mena Goes @ [facility]   Carotid stenosis 05/2015   RICA 40-59%, LICA 1-39%, L vertebral occlusion, rpt 1 yr   Cataract    removed both eyes    FHx: colon cancer    FHx: prostate cancer    Heart murmur    mild aortic stenosis    History of kidney stones    Hyperlipidemia    borderline- on rosuvastatin now normal    Occlusion of right vertebral artery 05/2015    Past Surgical History:  Procedure Laterality Date   BASAL CELL CARCINOMA EXCISION  05/2018   lip, 2020 x3  basal cells removed    CATARACT EXTRACTION  2008   Left   CATARACT EXTRACTION  2013   Right ([place]es)   COLONOSCOPY  ~2010   medium int hemorrhoids, o/w WNL, rpt 5 yrs given fmhx (Dr. Randa Evens)   COLONOSCOPY  08/2019   TAs, diverticulosis, rpt 3 yrs ([place]uster)   CORONARY/GRAFT ACUTE MI REVASCULARIZATION N/A 09/07/2022   Procedure: Coronary/Graft Acute MI Revascularization;  Surgeon: Orbie Pyo, MD;   Location: [facility];  Service: Cardiovascular;  Laterality: N/A;   ENDARTERECTOMY Right 12/01/2019   Procedure: RIGHT ENDARTERECTOMY CAROTID;  Surgeon: Cephus Shelling, MD;  Location: [facility] OR;  Service: Vascular;  Laterality: Right;   exercise treadmill  11/2005   WNL Eldridge Dace)   FINE NEEDLE ASPIRATION  07/28/2022   Procedure: FINE NEEDLE ASPIRATION (FNA) LINEAR;  Surgeon: Josephine Igo, DO;  Location: [facility];  Service: Pulmonary;;   HERNIA REPAIR  2008   Right   IR IMAGING GUIDED PORT INSERTION  08/11/2022   IR IMAGING GUIDED PORT INSERTION  10/08/2022   IR REMOVAL TUN ACCESS W/ PORT W/O FL MOD SED  09/10/2022   LEFT HEART CATH AND CORONARY ANGIOGRAPHY N/A 09/07/2022   Procedure: LEFT HEART CATH AND CORONARY ANGIOGRAPHY;  Surgeon: Orbie Pyo, MD;  Location: [facility];  Service: Cardiovascular;  Laterality: N/A;   MOHS SURGERY Left spring 2014   basal cell face   PATCH ANGIOPLASTY Right 12/01/2019   Procedure: PATCH ANGIOPLASTY USING Livia Snellen BIOLOGIC PATCH;  Surgeon: Cephus Shelling, MD;  Location[facility] OR;  Service: Vascular;  Laterality: Right;   Testicular Biopsy  2003   benign, varicocele   US ECHOCARDIOGRAPHY  11/2005   aortic sclerosis, EF 55-60%, diastolic dysfunction   VIDEO BRONCHOSCOPY WITH ENDOBRONCHIAL ULTRASOUND Bilateral  07/28/2022   Procedure: VIDEO BRONCHOSCOPY WITH ENDOBRONCHIAL ULTRASOUND;  Surgeon: Josephine Igo, DO;  Location: [facility];  Service: Pulmonary;  Laterality: Bilateral;    Family History  Problem Relation Age of Onset   Cancer Father 71       prostate   Coronary artery disease Father 95       MI   Prostate cancer Father    Cancer Paternal Grandfather 75       prostate   Prostate cancer Paternal Grandfather    Stroke Mother    Hypertension Mother    Cancer Mother 32       colon   Colon cancer Mother    Coronary artery disease Maternal Uncle    Cancer Sister 69       breast   Breast cancer Sister    Colon  cancer Maternal Grandmother    Diabetes Neg Hx    Colon polyps Neg Hx    Esophageal cancer Neg Hx    Rectal cancer Neg Hx    Stomach cancer Neg Hx       Social History   Socioeconomic History   Marital status: Married    Spouse name: Not on file   Number of children: Not on file   Years of education: Not on file   Highest education level: Not on file  Occupational History   Not on file  Tobacco Use   Smoking status: Never   Smokeless tobacco: Never  Vaping Use   Vaping Use: Never used  Substance and Sexual Activity   Alcohol use: Not Currently    Comment: rare   Drug use: No   Sexual activity: Not on file  Other Topics Concern   Not on file  Social History Narrative   Caffeine: 2-3 cups coffee/day   Lives with wife and adult son, 1 dog   Occupation: Warden/ranger   Edu: college   Activity: golf, no regular exercise, occasionally walks with wife   Diet: good amt water, daily fruits/vegetables, fish several times a week   Social Determinants of Health   Financial Resource Strain: Low Risk  (10/06/2022)   Overall Financial Resource Strain (CARDIA)    Difficulty of Paying Living Expenses: Not hard at all  Food Insecurity: No Food Insecurity (10/06/2022)   Hunger Vital Sign    Worried About Running Out of Food in the Last Year: Never true    Ran Out of Food in the Last Year: Never true  Transportation Needs: No Transportation Needs (10/06/2022)   PRAPARE - Administrator, Civil Service (Medical): No    Lack of Transportation (Non-Medical): No  Physical Activity: Inactive (10/06/2022)   Exercise Vital Sign    Days of Exercise per Week: 0 days    Minutes of Exercise per Session: 0 min  Stress: No Stress Concern Present (10/06/2022)   [employer] of Occupational Health - Occupational Stress Questionnaire    Feeling of Stress : Not at all  Social Connections: Moderately Integrated (10/06/2022)   Social Connection and Isolation Panel [NHANES]     Frequency of Communication with Friends and Family: More than three times a week    Frequency of Social Gatherings with Friends and Family: Three times a week    Attends Religious Services: More than 4 times per year    Active Member of Clubs or Organizations: No    Attends Banker Meetings: Never    Marital Status: Married    No  Known Allergies   Current Outpatient Medications:    abiraterone acetate (ZYTIGA) 250 MG tablet, Take 1,000 mg by mouth daily., Disp: , Rfl:    acetaminophen (TYLENOL) 500 MG tablet, Take 1,000 mg by mouth every 6 (six) hours as needed for mild pain or headache., Disp: , Rfl:    Calcium Carb-Cholecalciferol (RA CALCIUM PLUS VITAMIN D) 600-10 MG-MCG TABS, Take 600 mg by mouth daily., Disp: , Rfl:    fluticasone (FLONASE) 50 MCG/ACT nasal spray, Place 1 spray into both nostrils daily as needed for allergies or rhinitis., Disp: , Rfl:    lidocaine-prilocaine (EMLA) cream, Apply to the Port-A-Cath site 30-60 minutes before chemotherapy treatment (Patient taking differently: Apply 1 Application topically See admin instructions. Apply to the Port-A-Cath site 30-60 minutes before chemotherapy treatment), Disp: 30 g, Rfl: 0   metoprolol tartrate (LOPRESSOR) 25 MG tablet, Take 0.5 tablets (12.5 mg total) by mouth 2 (two) times daily., Disp: 30 tablet, Rfl: 11   Multiple Vitamin (MULTIVITAMIN) tablet, Take 1 tablet by mouth daily with breakfast., Disp: , Rfl:    predniSONE (DELTASONE) 5 MG tablet, Take 5 mg by mouth daily., Disp: , Rfl:    prochlorperazine (COMPAZINE) 10 MG tablet, Take 1 tablet (10 mg total) by mouth every 6 (six) hours as needed for nausea or vomiting., Disp: 30 tablet, Rfl: 0   rosuvastatin (CRESTOR) 20 MG tablet, TAKE 1 TABLET BY MOUTH EVERY DAY (Patient taking differently: Take 20 mg by mouth in the morning.), Disp: 90 tablet, Rfl: 1   SYSTANE ULTRA PF 0.4-0.3 % SOLN, Place 1 drop into both eyes 3 (three) times daily as needed (for  dryness/irritation)., Disp: 10 each, Rfl: 1   tamsulosin (FLOMAX) 0.4 MG CAPS capsule, Take 0.4 mg by mouth 2 (two) times daily., Disp: , Rfl:    ticagrelor (BRILINTA) 90 MG TABS tablet, Take 1 tablet (90 mg total) by mouth 2 (two) times daily., Disp: 60 tablet, Rfl: 11   Zinc 25 MG TABS, Take 25 mg by mouth daily., Disp: , Rfl:    Review of Systems  Constitutional:  Negative for activity change, appetite change, chills, diaphoresis, fatigue, fever and unexpected weight change.  HENT:  Negative for congestion, rhinorrhea, sinus pressure, sneezing, sore throat and trouble swallowing.   Eyes:  Negative for photophobia and visual disturbance.  Respiratory:  Negative for cough, chest tightness, shortness of breath, wheezing and stridor.   Cardiovascular:  Negative for chest pain, palpitations and leg swelling.  Gastrointestinal:  Negative for abdominal distention, abdominal pain, anal bleeding, blood in stool, constipation, diarrhea, nausea and vomiting.  Genitourinary:  Negative for difficulty urinating, dysuria, flank pain and hematuria.  Musculoskeletal:  Negative for arthralgias, back pain, gait problem, joint swelling and myalgias.  Skin:  Negative for color change, pallor, rash and wound.  Neurological:  Negative for dizziness, tremors, weakness, light-headedness and headaches.  Hematological:  Negative for adenopathy. Does not bruise/bleed easily.  Psychiatric/Behavioral:  Negative for agitation, behavioral problems, confusion, decreased concentration, dysphoric mood, sleep disturbance and suicidal ideas.        Objective:   Physical Exam Constitutional:      General: He is not in acute distress.    Appearance: Normal appearance. He is well-developed. He is not ill-appearing or diaphoretic.  HENT:     Head: Normocephalic and atraumatic.     Right Ear: Hearing and external ear normal.     Left Ear: Hearing and external ear normal.     Nose: No nasal deformity or rhinorrhea.  Eyes:      General: No scleral icterus.    Conjunctiva/sclera: Conjunctivae normal.     Right eye: Right conjunctiva is not injected.     Left eye: Left conjunctiva is not injected.     Pupils: Pupils are equal, round, and reactive to light.  Neck:     Vascular: No JVD.  Cardiovascular:     Rate and Rhythm: Normal rate and regular rhythm.     Heart sounds: S1 normal and S2 normal.  Pulmonary:     Effort: Pulmonary effort is normal. No respiratory distress.     Breath sounds: No wheezing.  Abdominal:     General: Bowel sounds are normal. There is no distension.     Palpations: Abdomen is soft.     Tenderness: There is no abdominal tenderness.  Musculoskeletal:        General: Normal range of motion.     Right shoulder: Normal.     Left shoulder: Normal.     Cervical back: Normal range of motion and neck supple.     Right hip: Normal.     Left hip: Normal.     Right knee: Normal.     Left knee: Normal.  Lymphadenopathy:     Head:     Right side of head: No submandibular, preauricular or posterior auricular adenopathy.     Left side of head: No submandibular, preauricular or posterior auricular adenopathy.     Cervical: No cervical adenopathy.     Right cervical: No superficial or deep cervical adenopathy.    Left cervical: No superficial or deep cervical adenopathy.  Skin:    General: Skin is warm and dry.     Coloration: Skin is not pale.     Findings: No abrasion, bruising, ecchymosis, erythema, lesion or rash.     Nails: There is no clubbing.  Neurological:     General: No focal deficit present.     Mental Status: He is alert and oriented to person, place, and time.     Sensory: No sensory deficit.     Coordination: Coordination normal.     Gait: Gait normal.  Psychiatric:        Attention and Perception: He is attentive.        Speech: Speech normal.        Behavior: Behavior normal. Behavior is cooperative.        Thought Content: Thought content normal.        Judgment:  Judgment normal.           Assessment & Plan:   Enterococcal bacteremia:  Check surveillance blood cultures from 2 sites today.  Metastatic prostate cancer: Following closely with oncology as well as urology.  He would like PSA checked with his labs.   I have personally spent 32 minutes involved in face-to-face and non-face-to-face activities for this patient on the day of the visit. Professional time spent includes the following activities: Preparing to see the patient (review of tests), Obtaining and/or reviewing separately obtained history (admission/discharge record), Performing a medically appropriate examination and/or evaluation , Ordering medications/tests/procedures, referring and communicating with other health care professionals, Documenting clinical information in the EMR, Independently interpreting results (not separately reported), Communicating results to the patient/family/caregiver, Counseling and educating the patient/family/caregiver and Care coordination (not separately reported).

## 2022-11-17 LAB — CULTURE, BLOOD (SINGLE): SPECIMEN QUALITY:: ADEQUATE

## 2022-11-19 LAB — CULTURE, BLOOD (SINGLE)

## 2022-11-20 ENCOUNTER — Inpatient Hospital Stay: Payer: Medicare Other | Admitting: Dietician

## 2022-11-20 NOTE — Progress Notes (Signed)
Nutrition Assessment  Reason for Assessment:   ASSESSMENT: 77 year old male with metastatic poorly differentiated carcinoma, neuroendocrine carcinoma of suspicious prostate primary versus primary lung cancer. Prostate cancer diagnosed 02/24 currently managed by Dr. Mena Goes. He is currenlty receiving maintenance Imfinzi q4w. Patient is under the care of Dr. Arbutus Ped.   Past medical history includes BCC (right arm, face) s/p MOHS, BPH, mild aortic stenosis, recent mild MI s/p revascularization (09/07/22)  3/11-3/19 admission - enterococcal bacteremia  Met with patient and his wife in office. Patient reports good appetite. Says he has been following heart healthy diet s/p recent heart attack that occurred after his third chemotherapy. Patient has been working to increase protein as well iron rich foods per MD recommendations. He has questions about ways to increase these and be heart healthy. Yesterday he had liver mush and fried egg for breakfast, snacked on fresh fruit with muscle milk, bowl of bean chili for lunch, BBQ sandwich with more chili for dinner. Patient has a sweet tooth and enjoys ice cream. He is asking if this is okay to have. Wife reports recent prediabetes diagnosis. She would like to discuss what this means exactly, how to prevent progression to diabetes, and has requested RD assistance with meal planning.  Nutrition Focused Physical Exam: deferred    Medications: reviewed    Labs: 5/14 labs reviewed    Anthropometrics:   Height: 5'9" Weight: 178 lb  UBW: 180-185 lb BMI: 26.29   NUTRITION DIAGNOSIS: Food and nutrition related knowledge deficit related to increased protein needs secondary to metastatic lung cancer s/p recent MI as evidenced by no prior need for associated nutrition information   INTERVENTION:  Dicussed sources of iron rich foods, encouraged lean cuts/low fat red meat and limiting intake to 18 ounces/week - handout provided  Educated on plant-based  proteins and encouraged protein source with all meals - handout provided  Continue daily protein shake Discussed balanced meals/snacks, encouraged smaller meals more frequently vs eating one large meal to improve glucose control - handout provided  All questions answered  Contact information provided    MONITORING, EVALUATION, GOAL: Patient will tolerate increased calories and protein to minimize weight loss during treatment    Next Visit: No follow-up scheduled at this time. Pt encouraged to contact with nutrition questions/concerns

## 2022-11-21 LAB — CULTURE, BLOOD (SINGLE)
MICRO NUMBER:: 14978795
MICRO NUMBER:: 14995401
Result:: NO GROWTH
Result:: NO GROWTH
SPECIMEN QUALITY:: ADEQUATE

## 2022-11-23 ENCOUNTER — Encounter: Payer: Self-pay | Admitting: Internal Medicine

## 2022-11-25 ENCOUNTER — Encounter: Payer: Medicare Other | Attending: Cardiovascular Disease | Admitting: *Deleted

## 2022-11-25 ENCOUNTER — Encounter: Payer: Self-pay | Admitting: *Deleted

## 2022-11-25 DIAGNOSIS — Z955 Presence of coronary angioplasty implant and graft: Secondary | ICD-10-CM

## 2022-11-25 DIAGNOSIS — I213 ST elevation (STEMI) myocardial infarction of unspecified site: Secondary | ICD-10-CM

## 2022-11-25 NOTE — Progress Notes (Signed)
Virtual orientation call completed today. he has an appointment on Date: 12/01/2022  for EP eval and gym Orientation.  Documentation of diagnosis can be found in [facility] Date: 09/25/2022 .

## 2022-11-25 NOTE — Progress Notes (Unsigned)
Cardiology Office Note:   Date:  11/26/2022  ID:  Larry Kent, DOB April 10, 1946, MRN [id number]  History of Present Illness:   Larry Kent is a 77 y.o. male with a past medical history of coronary artery disease, carotid disease, mild aortic stenosis, hyperlipidemia, prostate cancer, lung nodules with pathology showing neuroendocrine carcinoma, who is here today for follow up.   Patient underwent right carotid endarterectomy in 6/21 with severe asymptomatic stenosis.  He then presented in March to the cancer center with chest pain and shortness of breath.  EKG revealed evidence of inferior ST elevated myocardial infarction.  He was transferred to [facility] and underwent emergent cardiac catheterization which thrombotic occlusion of the right coronary artery which was treated with 4 overlapping drug-eluting stents.  There was 50% distal left main stenosis, 60% proximal left circumflex stenosis and 60% mid LAD stenosis.  Echocardiogram revealed normal LV systolic function.  He was last seen in clinic 10/17/2022 by Dr.Arida.  At that time he denied recurrent chest pain but continued to have significant exertional dyspnea and palpitations.  He was still undergoing chemotherapy and was still significantly anemic.  His aspirin was discontinued and he was left on Brilinta monotherapy.  He returns to clinic today with several questions and concerns.  He had an emergency department visit where he had been scooting around on a 5 gallon bucket changing outlet covers for few hours.  He had some mid back pain that had worsened over time.  He had gone to an eye doctor appointment and come back and he continued to have worsening of back pain and associated shortness of breath.  He presented to the emergency department revealed a high-sensitivity troponin of 21 x 2.  He had several questions about troponin today.  Has not during his previous admission for his MI his high-sensitivity troponin peaked at over  7000.  At his last visit he was referred to cardiac rehab where he has a start date for the 11/28/2022.  He also continues to be scheduled for his cardiac PET scan that is on 12/15/2022.  Since his visit to the emergency department he has had no further episodes of back pain or shortness of breath.  Denies chest pain, palpitations, lightheadedness, peripheral edema.  He is finished with his chemotherapy and is started on immunotherapy that has been ongoing.  Unfortunately he has complaints of decreased sleep since starting on immunotherapy.  ROS: 10 point review of systems reviewed and considered negative with exception of what is listed in HPI  Studies Reviewed:    EKG: Sinus rhythm with a rate of 62, no acute change from prior studies  TTE 09/08/22 1. Left ventricular ejection fraction, by estimation, is 65 to 70%. The  left ventricle has normal function. The left ventricle demonstrates  regional wall motion abnormalities (see scoring diagram/findings for  description). There is moderate concentric  left ventricular hypertrophy. Left ventricular diastolic parameters are  indeterminate.   2. Right ventricular systolic function is normal. The right ventricular  size is moderately enlarged. There is normal pulmonary artery systolic  pressure.   3. Right atrial size was mildly dilated.   4. The mitral valve is degenerative. No evidence of mitral valve  regurgitation. No evidence of mitral stenosis. The mean mitral valve  gradient is 3.0 mmHg with average heart rate of 66 bpm. Severe mitral  annular calcification.   5. Mixed valve disease. Central aortic regurgitation, normal LV stroke  volume index. The aortic valve is  calcified. Aortic valve regurgitation is  mild to moderate. Mild aortic valve stenosis. Aortic valve mean gradient  measures 14.0 mmHg.   6. The inferior vena cava is normal in size with greater than 50%  respiratory variability, suggesting right atrial pressure of 3 mmHg.       LHC 09/07/22   Ost RCA lesion is 80% stenosed.   Prox RCA lesion is 70% stenosed.   Mid LM to Dist LM lesion is 50% stenosed.   Dist LM to Ost LAD lesion is 50% stenosed.   Ost Cx lesion is 40% stenosed.   Prox Cx lesion is 60% stenosed.   Mid LAD lesion is 60% stenosed.   A stent was successfully placed.   Post intervention, there is a 0% residual stenosis.   Post intervention, there is a 0% residual stenosis.   1.  Thrombotic occlusion of right coronary artery treated with 4 overlapping drug-eluting stents. 2.  Distal left main, ostial LAD, and ostial left circumflex disease. 3.  LVEDP of 11 mmHg.   Recommendation: The results were discussed with Dr. Herbie Baltimore.  The patient will be admitted to the hospital, medical therapy will be pursued as well as an echocardiogram.  Will follow-up as an outpatient regarding treatment for residual disease.   Diagnostic Dominance: Right  Intervention     Risk Assessment/Calculations:              Physical Exam:   VS:  BP 120/64 (BP Location: Left Arm, Patient Position: Sitting, Cuff Size: Normal)   Pulse 62   Ht 5\' 9"  (1.753 m)   Wt 180 lb (81.6 kg)   SpO2 98%   BMI 26.58 kg/m    Wt Readings from Last 3 Encounters:  11/26/22 180 lb (81.6 kg)  11/16/22 178 lb (80.7 kg)  11/10/22 174 lb 1.6 oz (79 kg)     GEN: Well nourished, well developed in no acute distress NECK: No JVD; No carotid bruits CARDIAC: RRR, II/VI systolic murmur, without rubs, gallops RESPIRATORY:  Clear to auscultation without rales, wheezing or rhonchi  ABDOMEN: Soft, non-tender, non-distended EXTREMITIES:  No edema; No deformity   ASSESSMENT AND PLAN:   Coronary artery disease with recent inferior STEMI on 09/07/2022 underwent cardiac catheterization with DES placement x 4 to RCA.  He continues to remain chest pain-free today without ischemic changes noted on EKG.  He has been continued on Brilinta 90 mg twice a day monotherapy is at high risk for reocclusion.  He  is also continued on rosuvastatin 20 mg daily.  He is scheduled for his cardiac PET scan as recommended catheterization.  It is scheduled for 12/15/2022 at 10 AM.  He is also scheduled to start cardiac rehab on 6//24.  Shortness of breath that is now improved.  Previously had worsening dyspnea on exertion which was likely related to symptomatic anemia.  Last hemoglobin was up to 10 which has decreased amount of symptoms.  Since he has been to chemotherapy will consider repeat echocardiogram on return.  Mixed hyperlipidemia with last LDL of 64 with remains at goal.  He is continued on rosuvastatin 20 mg daily.  Aortic stenosis with heart murmur noted on exam.  Mild aortic valve stenosis with aortic valve mean gradient of 14 mmHg last echocardiogram done 08/2022.  With noted aortic valve regurgitation being mild to moderate.  Carotid artery disease status post right carotid endarterectomy in 2021.  Continues to be followed by [facility].  Metastatic neuroendocrine carcinoma with advanced prostate cancer.  He  is continued on immunotherapy that has been ongoing.  He states that he is finished with chemotherapy.  He continues to be managed by oncology.  Disposition patient return to clinic to see MD/APP at previously scheduled appointment in July after cardiac PET scan is completed, or sooner if needed.         Signed, Zylah Elsbernd, NP

## 2022-11-26 ENCOUNTER — Ambulatory Visit: Payer: Medicare Other | Attending: General Practice | Admitting: Cardiology

## 2022-11-26 ENCOUNTER — Encounter: Payer: Self-pay | Admitting: Cardiology

## 2022-11-26 VITALS — BP 120/64 | HR 62 | Ht 69.0 in | Wt 180.0 lb

## 2022-11-26 DIAGNOSIS — R0602 Shortness of breath: Secondary | ICD-10-CM | POA: Diagnosis not present

## 2022-11-26 DIAGNOSIS — I350 Nonrheumatic aortic (valve) stenosis: Secondary | ICD-10-CM

## 2022-11-26 DIAGNOSIS — I2510 Atherosclerotic heart disease of native coronary artery without angina pectoris: Secondary | ICD-10-CM

## 2022-11-26 DIAGNOSIS — E785 Hyperlipidemia, unspecified: Secondary | ICD-10-CM

## 2022-11-26 DIAGNOSIS — C3490 Malignant neoplasm of unspecified part of unspecified bronchus or lung: Secondary | ICD-10-CM | POA: Diagnosis not present

## 2022-11-26 DIAGNOSIS — I6523 Occlusion and stenosis of bilateral carotid arteries: Secondary | ICD-10-CM | POA: Diagnosis not present

## 2022-11-26 NOTE — Patient Instructions (Signed)
Medication Instructions:  Your physician recommends that you continue on your current medications as directed. Please refer to the Current Medication list given to you today.  *If you need a refill on your cardiac medications before your next appointment, please call your pharmacy*  Lab Work: -None ordered If you have labs (blood work) drawn today and your tests are completely normal, you will receive your results only by: MyChart Message (if you have MyChart) OR A paper copy in the mail If you have any lab test that is abnormal or we need to change your treatment, we will call you to review the results.  Testing/Procedures: -None ordered  Follow-Up: At [facility], you and your health needs are our priority.  As part of our continuing mission to provide you with exceptional heart care, we have created designated Provider Care Teams.  These Care Teams include your primary Cardiologist (physician) and Advanced Practice Providers (APPs -  Physician Assistants and Nurse Practitioners) who all work together to provide you with the care you need, when you need it.  Your next appointment:    01/27/2023 9:20 AM Arrive by:  9:05 AM   Provider:   Iran Ouch, MD Other Instructions -None

## 2022-11-30 ENCOUNTER — Ambulatory Visit: Payer: Medicare Other

## 2022-12-01 ENCOUNTER — Encounter: Payer: Medicare Other | Attending: Cardiovascular Disease

## 2022-12-01 VITALS — Ht 67.5 in | Wt 180.2 lb

## 2022-12-01 DIAGNOSIS — I213 ST elevation (STEMI) myocardial infarction of unspecified site: Secondary | ICD-10-CM | POA: Insufficient documentation

## 2022-12-01 DIAGNOSIS — Z955 Presence of coronary angioplasty implant and graft: Secondary | ICD-10-CM | POA: Insufficient documentation

## 2022-12-01 NOTE — Patient Instructions (Signed)
Patient Instructions  Patient Details  Name: Larry Kent MRN: [id number] Date of Birth: [birth year] Referring Provider:  [name], MD  Below are your personal goals for exercise, nutrition, and risk factors. Our goal is to help you stay on track towards obtaining and maintaining these goals. We will be discussing your progress on these goals with you throughout the program.  Initial Exercise Prescription:  Initial Exercise Prescription - 12/01/22 1400       Date of Initial Exercise RX and Referring Provider   Date 12/01/22    Referring Provider Dr. Lorine Bears, MD      Oxygen   Maintain Oxygen Saturation 88% or higher      Treadmill   MPH 2    Grade 0    Minutes 15    METs 2.53      NuStep   Level 2    SPM 80    Minutes 15    METs 2.5      REL-XR   Level 2    Speed 50    Minutes 15    METs 2.5      T5 Nustep   Level 1    SPM 80    Minutes 15    METs 2.5      Prescription Details   Frequency (times per week) 3    Duration Progress to 30 minutes of continuous aerobic without signs/symptoms of physical distress      Intensity   THRR 40-80% of Max Heartrate 93-126    Ratings of Perceived Exertion 11-13    Perceived Dyspnea 0-4      Progression   Progression Continue to progress workloads to maintain intensity without signs/symptoms of physical distress.      Resistance Training   Training Prescription Yes    Weight 4 lb    Reps 10-15             Exercise Goals: Frequency: Be able to perform aerobic exercise two to three times per week in program working toward 2-5 days per week of home exercise.  Intensity: Work with a perceived exertion of 11 (fairly light) - 15 (hard) while following your exercise prescription.  We will make changes to your prescription with you as you progress through the program.   Duration: Be able to do 30 to 45 minutes of continuous aerobic exercise in addition to a 5 minute warm-up and a 5 minute cool-down  routine.   Nutrition Goals: Your personal nutrition goals will be established when you do your nutrition analysis with the dietician.  The following are general nutrition guidelines to follow: Cholesterol < 200mg /day Sodium < 1500mg /day Fiber: Men over 50 yrs - 30 grams per day  Personal Goals:  Personal Goals and Risk Factors at Admission - 11/25/22 1012       Core Components/Risk Factors/Patient Goals on Admission    Weight Management Yes    Intervention Weight Management: Provide education and appropriate resources to help participant work on and attain dietary goals.;Weight Management: Develop a combined nutrition and exercise program designed to reach desired caloric intake, while maintaining appropriate intake of nutrient and fiber, sodium and fats, and appropriate energy expenditure required for the weight goal.    Admit Weight 175 lb (79.4 kg)    Goal Weight: Long Term 175 lb (79.4 kg)    Expected Outcomes Short Term: Continue to assess and modify interventions until short term weight is achieved;Long Term: Adherence to nutrition and physical activity/exercise program aimed toward  attainment of established weight goal;Weight Maintenance: Understanding of the daily nutrition guidelines, which includes 25-35% calories from fat, 7% or less cal from saturated fats, less than 200mg  cholesterol, less than 1.5gm of sodium, & 5 or more servings of fruits and vegetables daily    Lipids Yes    Intervention Provide education and support for participant on nutrition & aerobic/resistive exercise along with prescribed medications to achieve LDL 70mg , HDL >40mg .    Expected Outcomes Short Term: Participant states understanding of desired cholesterol values and is compliant with medications prescribed. Participant is following exercise prescription and nutrition guidelines.;Long Term: Cholesterol controlled with medications as prescribed, with individualized exercise RX and with personalized nutrition  plan. Value goals: LDL < 70mg , HDL > 40 mg.            Exercise Goals and Review:  Exercise Goals     Row Name 12/01/22 1309             Exercise Goals   Increase Physical Activity Yes       Intervention Develop an individualized exercise prescription for aerobic and resistive training based on initial evaluation findings, risk stratification, comorbidities and participant's personal goals.;Provide advice, education, support and counseling about physical activity/exercise needs.       Expected Outcomes Long Term: Exercising regularly at least 3-5 days a week.;Long Term: Add in home exercise to make exercise part of routine and to increase amount of physical activity.;Short Term: Attend rehab on a regular basis to increase amount of physical activity.       Increase Strength and Stamina Yes       Intervention Develop an individualized exercise prescription for aerobic and resistive training based on initial evaluation findings, risk stratification, comorbidities and participant's personal goals.;Provide advice, education, support and counseling about physical activity/exercise needs.       Expected Outcomes Short Term: Increase workloads from initial exercise prescription for resistance, speed, and METs.;Short Term: Perform resistance training exercises routinely during rehab and add in resistance training at home;Long Term: Improve cardiorespiratory fitness, muscular endurance and strength as measured by increased METs and functional capacity ( )       Able to understand and use rate of perceived exertion (RPE) scale Yes       Intervention Provide education and explanation on how to use RPE scale       Expected Outcomes Short Term: Able to use RPE daily in rehab to express subjective intensity level;Long Term:  Able to use RPE to guide intensity level when exercising independently       Able to understand and use Dyspnea scale Yes       Intervention Provide education and explanation on  how to use Dyspnea scale       Expected Outcomes Short Term: Able to use Dyspnea scale daily in rehab to express subjective sense of shortness of breath during exertion;Long Term: Able to use Dyspnea scale to guide intensity level when exercising independently       Knowledge and understanding of Target Heart Rate Range (THRR) Yes       Intervention Provide education and explanation of THRR including how the numbers were predicted and where they are located for reference       Expected Outcomes Long Term: Able to use THRR to govern intensity when exercising independently;Short Term: Able to state/look up THRR;Short Term: Able to use daily as guideline for intensity in rehab       Able to check pulse independently Yes  Intervention Provide education and demonstration on how to check pulse in carotid and radial arteries.;Review the importance of being able to check your own pulse for safety during independent exercise       Expected Outcomes Short Term: Able to explain why pulse checking is important during independent exercise;Long Term: Able to check pulse independently and accurately       Understanding of Exercise Prescription Yes       Intervention Provide education, explanation, and written materials on patient's individual exercise prescription       Expected Outcomes Short Term: Able to explain program exercise prescription;Long Term: Able to explain home exercise prescription to exercise independently

## 2022-12-01 NOTE — Progress Notes (Signed)
Cardiac Individual Treatment Plan  Patient Details  Name: Larry Kent MRN: [id number] Date of Birth: [birth year] Referring Provider:   Flowsheet Row Cardiac Rehab from 12/01/2022 in [facility] Cardiac and Pulmonary Rehab  Referring Provider Dr. Lorine Bears, MD       Initial Encounter Date:  Flowsheet Row Cardiac Rehab from 12/01/2022 in [facility] Cardiac and Pulmonary Rehab  Date 12/01/22       Visit Diagnosis: ST elevation myocardial infarction (STEMI), unspecified artery [facility])  Status post coronary artery stent placement  Patient's Home Medications on Admission:  Current Outpatient Medications:    abiraterone acetate (ZYTIGA) 250 MG tablet, Take 1,000 mg by mouth daily., Disp: , Rfl:    acetaminophen (TYLENOL) 500 MG tablet, Take 1,000 mg by mouth every 6 (six) hours as needed for mild pain or headache., Disp: , Rfl:    Calcium Carb-Cholecalciferol (RA CALCIUM PLUS VITAMIN D) 600-10 MG-MCG TABS, Take 600 mg by mouth daily., Disp: , Rfl:    fluticasone (FLONASE) 50 MCG/ACT nasal spray, Place 1 spray into both nostrils daily as needed for allergies or rhinitis., Disp: , Rfl:    lidocaine-prilocaine (EMLA) cream, Apply to the Port-A-Cath site 30-60 minutes before chemotherapy treatment (Patient taking differently: Apply 1 Application topically See admin instructions. Apply to the Port-A-Cath site 30-60 minutes before chemotherapy treatment), Disp: 30 g, Rfl: 0   metoprolol tartrate (LOPRESSOR) 25 MG tablet, Take 0.5 tablets (12.5 mg total) by mouth 2 (two) times daily., Disp: 30 tablet, Rfl: 11   Multiple Vitamin (MULTIVITAMIN) tablet, Take 1 tablet by mouth daily with breakfast., Disp: , Rfl:    predniSONE (DELTASONE) 5 MG tablet, Take 5 mg by mouth daily., Disp: , Rfl:    prochlorperazine (COMPAZINE) 10 MG tablet, Take 1 tablet (10 mg total) by mouth every 6 (six) hours as needed for nausea or vomiting., Disp: 30 tablet, Rfl: 0   rosuvastatin (CRESTOR) 20 MG tablet, TAKE 1 TABLET BY MOUTH  EVERY DAY (Patient taking differently: Take 20 mg by mouth in the morning.), Disp: 90 tablet, Rfl: 1   SYSTANE ULTRA PF 0.4-0.3 % SOLN, Place 1 drop into both eyes 3 (three) times daily as needed (for dryness/irritation)., Disp: 10 each, Rfl: 1   tamsulosin (FLOMAX) 0.4 MG CAPS capsule, Take 0.4 mg by mouth 2 (two) times daily., Disp: , Rfl:    ticagrelor (BRILINTA) 90 MG TABS tablet, Take 1 tablet (90 mg total) by mouth 2 (two) times daily., Disp: 60 tablet, Rfl: 11   Zinc 25 MG TABS, Take 25 mg by mouth daily., Disp: , Rfl:   Past Medical History: Past Medical History:  Diagnosis Date   Allergy    Arthritis    both knees    BCC (basal cell carcinoma), arm, right 09/2019   MOHS ([place])   BCC (basal cell carcinoma), face 2014   L preauricular s/p MOHS   BCC (basal cell carcinoma), face 02/2018   L upper lip   BPH (benign prostatic hypertrophy)    Dr. Mena Goes @ [facility]   Carotid stenosis 05/2015   RICA 40-59%, LICA 1-39%, L vertebral occlusion, rpt 1 yr   Cataract    removed both eyes    FHx: colon cancer    FHx: prostate cancer    Heart murmur    mild aortic stenosis    History of kidney stones    Hyperlipidemia    borderline- on rosuvastatin now normal    Occlusion of right vertebral artery 05/2015    Tobacco Use:  Social History   Tobacco Use  Smoking Status Never  Smokeless Tobacco Never    Labs: Review Flowsheet  More data exists      Latest Ref Rng & Units 09/09/2020 09/23/2021 07/28/2022 08/21/2022 09/07/2022  Labs for ITP Cardiac and Pulmonary Rehab  Cholestrol 0 - 200 mg/dL 161  096  - - 045   LDL (calc) 0 - 99 mg/dL 61  61  - - 64   HDL-C >40 mg/dL 40.98  11.91  - - 62   Trlycerides <150 mg/dL 47.8  29.5  - - 55   Hemoglobin A1c 4.8 - 5.6 % 6.0  6.1  - - 6.8   TCO2 22 - 32 mmol/L - - 25  22  21       Exercise Target Goals: Exercise Program Goal: Individual exercise prescription set using results from initial 6 min walk test and THRR while considering   patient's activity barriers and safety.   Exercise Prescription Goal: Initial exercise prescription builds to 30-45 minutes a day of aerobic activity, 2-3 days per week.  Home exercise guidelines will be given to patient during program as part of exercise prescription that the participant will acknowledge.   Education: Aerobic Exercise: - Group verbal and visual presentation on the components of exercise prescription. Introduces F.I.T.T principle from ACSM for exercise prescriptions.  Reviews F.I.T.T. principles of aerobic exercise including progression. Written material given at graduation. Flowsheet Row Cardiac Rehab from 12/01/2022 in [facility] Cardiac and Pulmonary Rehab  Education need identified 12/01/22       Education: Resistance Exercise: - Group verbal and visual presentation on the components of exercise prescription. Introduces F.I.T.T principle from ACSM for exercise prescriptions  Reviews F.I.T.T. principles of resistance exercise including progression. Written material given at graduation.    Education: Exercise & Equipment Safety: - Individual verbal instruction and demonstration of equipment use and safety with use of the equipment. Flowsheet Row Cardiac Rehab from 12/01/2022 in [facility] Cardiac and Pulmonary Rehab  Date 12/01/22  Educator NT  Instruction Review Code 1- Verbalizes Understanding       Education: Exercise Physiology & General Exercise Guidelines: - Group verbal and written instruction with models to review the exercise physiology of the cardiovascular system and associated critical values. Provides general exercise guidelines with specific guidelines to those with heart or lung disease.    Education: Flexibility, Balance, Mind/Body Relaxation: - Group verbal and visual presentation with interactive activity on the components of exercise prescription. Introduces F.I.T.T principle from ACSM for exercise prescriptions. Reviews F.I.T.T. principles of flexibility and  balance exercise training including progression. Also discusses the mind body connection.  Reviews various relaxation techniques to help reduce and manage stress (i.e. Deep breathing, progressive muscle relaxation, and visualization). Balance handout provided to take home. Written material given at graduation.   Activity Barriers & Risk Stratification:  Activity Barriers & Cardiac Risk Stratification - 12/01/22 1451       Activity Barriers & Cardiac Risk Stratification   Activity Barriers Other (comment);Back Problems;Arthritis    Comments R knee pain    Cardiac Risk Stratification Moderate             6 Minute Walk:  6 Minute Walk     Row Name 12/01/22 1450         6 Minute Walk   Phase Initial     Distance 1295 feet     Walk Time 6 minutes     # of Rest Breaks 0     MPH  2.45     METS 2.5     RPE 7     Perceived Dyspnea  0     VO2 Peak 8.74     Symptoms No     Resting HR 60 bpm     Resting BP 102/50     Resting Oxygen Saturation  97 %     Exercise Oxygen Saturation  during 6 min walk 99 %     Max Ex. HR 91 bpm     Max Ex. BP 144/66     2 Minute Post BP 122/62              Oxygen Initial Assessment:   Oxygen Re-Evaluation:   Oxygen Discharge (Final Oxygen Re-Evaluation):   Initial Exercise Prescription:  Initial Exercise Prescription - 12/01/22 1400       Date of Initial Exercise RX and Referring Provider   Date 12/01/22    Referring Provider Dr. Lorine Bears, MD      Oxygen   Maintain Oxygen Saturation 88% or higher      Treadmill   MPH 2    Grade 0    Minutes 15    METs 2.53      NuStep   Level 2    SPM 80    Minutes 15    METs 2.5      REL-XR   Level 2    Speed 50    Minutes 15    METs 2.5      T5 Nustep   Level 1    SPM 80    Minutes 15    METs 2.5      Prescription Details   Frequency (times per week) 3    Duration Progress to 30 minutes of continuous aerobic without signs/symptoms of physical distress       Intensity   THRR 40-80% of Max Heartrate 93-126    Ratings of Perceived Exertion 11-13    Perceived Dyspnea 0-4      Progression   Progression Continue to progress workloads to maintain intensity without signs/symptoms of physical distress.      Resistance Training   Training Prescription Yes    Weight 4 lb    Reps 10-15             Perform Capillary Blood Glucose checks as needed.  Exercise Prescription Changes:   Exercise Prescription Changes     Row Name 12/01/22 1400             Response to Exercise   Blood Pressure (Admit) 102/50       Blood Pressure (Exercise) 144/66       Blood Pressure (Exit) 122/62       Heart Rate (Admit) 60 bpm       Heart Rate (Exercise) 91 bpm       Heart Rate (Exit) 59 bpm       Oxygen Saturation (Admit) 97 %       Oxygen Saturation (Exercise) 99 %       Rating of Perceived Exertion (Exercise) 7       Perceived Dyspnea (Exercise) 0       Symptoms None       Comments Results                Exercise Comments:   Exercise Goals and Review:   Exercise Goals     Row Name 12/01/22 1309  Exercise Goals   Increase Physical Activity Yes       Intervention Develop an individualized exercise prescription for aerobic and resistive training based on initial evaluation findings, risk stratification, comorbidities and participant's personal goals.;Provide advice, education, support and counseling about physical activity/exercise needs.       Expected Outcomes Long Term: Exercising regularly at least 3-5 days a week.;Long Term: Add in home exercise to make exercise part of routine and to increase amount of physical activity.;Short Term: Attend rehab on a regular basis to increase amount of physical activity.       Increase Strength and Stamina Yes       Intervention Develop an individualized exercise prescription for aerobic and resistive training based on initial evaluation findings, risk stratification, comorbidities  and participant's personal goals.;Provide advice, education, support and counseling about physical activity/exercise needs.       Expected Outcomes Short Term: Increase workloads from initial exercise prescription for resistance, speed, and METs.;Short Term: Perform resistance training exercises routinely during rehab and add in resistance training at home;Long Term: Improve cardiorespiratory fitness, muscular endurance and strength as measured by increased METs and functional capacity ( )       Able to understand and use rate of perceived exertion (RPE) scale Yes       Intervention Provide education and explanation on how to use RPE scale       Expected Outcomes Short Term: Able to use RPE daily in rehab to express subjective intensity level;Long Term:  Able to use RPE to guide intensity level when exercising independently       Able to understand and use Dyspnea scale Yes       Intervention Provide education and explanation on how to use Dyspnea scale       Expected Outcomes Short Term: Able to use Dyspnea scale daily in rehab to express subjective sense of shortness of breath during exertion;Long Term: Able to use Dyspnea scale to guide intensity level when exercising independently       Knowledge and understanding of Target Heart Rate Range (THRR) Yes       Intervention Provide education and explanation of THRR including how the numbers were predicted and where they are located for reference       Expected Outcomes Long Term: Able to use THRR to govern intensity when exercising independently;Short Term: Able to state/look up THRR;Short Term: Able to use daily as guideline for intensity in rehab       Able to check pulse independently Yes       Intervention Provide education and demonstration on how to check pulse in carotid and radial arteries.;Review the importance of being able to check your own pulse for safety during independent exercise       Expected Outcomes Short Term: Able to explain why  pulse checking is important during independent exercise;Long Term: Able to check pulse independently and accurately       Understanding of Exercise Prescription Yes       Intervention Provide education, explanation, and written materials on patient's individual exercise prescription       Expected Outcomes Short Term: Able to explain program exercise prescription;Long Term: Able to explain home exercise prescription to exercise independently                Exercise Goals Re-Evaluation :   Discharge Exercise Prescription (Final Exercise Prescription Changes):  Exercise Prescription Changes - 12/01/22 1400       Response to Exercise   Blood Pressure (  Admit) 102/50    Blood Pressure (Exercise) 144/66    Blood Pressure (Exit) 122/62    Heart Rate (Admit) 60 bpm    Heart Rate (Exercise) 91 bpm    Heart Rate (Exit) 59 bpm    Oxygen Saturation (Admit) 97 %    Oxygen Saturation (Exercise) 99 %    Rating of Perceived Exertion (Exercise) 7    Perceived Dyspnea (Exercise) 0    Symptoms None    Comments Results             Nutrition:  Target Goals: Understanding of nutrition guidelines, daily intake of sodium 1500mg , cholesterol 200mg , calories 30% from fat and 7% or less from saturated fats, daily to have 5 or more servings of fruits and vegetables.  Education: All About Nutrition: -Group instruction provided by verbal, written material, interactive activities, discussions, models, and posters to present general guidelines for heart healthy nutrition including fat, fiber, MyPlate, the role of sodium in heart healthy nutrition, utilization of the nutrition label, and utilization of this knowledge for meal planning. Follow up email sent as well. Written material given at graduation. Flowsheet Row Cardiac Rehab from 12/01/2022 in [facility] Cardiac and Pulmonary Rehab  Education need identified 12/01/22       Biometrics:  Pre Biometrics - 12/01/22 1455       Pre Biometrics    Height 5' 7.5" (1.715 m)    Weight 180 lb 3.2 oz (81.7 kg)    Waist Circumference 37.5 inches    Hip Circumference 39.5 inches    Waist to Hip Ratio 0.95 %    BMI (Calculated) 27.79    Single Leg Stand 12.7 seconds              Nutrition Therapy Plan and Nutrition Goals:  Nutrition Therapy & Goals - 12/01/22 1426       Intervention Plan   Intervention Prescribe, educate and counsel regarding individualized specific dietary modifications aiming towards targeted core components such as weight, hypertension, lipid management, diabetes, heart failure and other comorbidities.    Expected Outcomes Short Term Goal: Understand basic principles of dietary content, such as calories, fat, sodium, cholesterol and nutrients.;Short Term Goal: A plan has been developed with personal nutrition goals set during dietitian appointment.;Long Term Goal: Adherence to prescribed nutrition plan.             Nutrition Assessments:  MEDIFICTS Score Key: ?70 Need to make dietary changes  40-70 Heart Healthy Diet ? 40 Therapeutic Level Cholesterol Diet  Flowsheet Row Cardiac Rehab from 12/01/2022 in [facility] Cardiac and Pulmonary Rehab  Picture Your Plate Total Score on Admission 63      Picture Your Plate Scores: <31 Unhealthy dietary pattern with much room for improvement. 41-50 Dietary pattern unlikely to meet recommendations for good health and room for improvement. 51-60 More healthful dietary pattern, with some room for improvement.  >60 Healthy dietary pattern, although there may be some specific behaviors that could be improved.    Nutrition Goals Re-Evaluation:   Nutrition Goals Discharge (Final Nutrition Goals Re-Evaluation):   Psychosocial: Target Goals: Acknowledge presence or absence of significant depression and/or stress, maximize coping skills, provide positive support system. Participant is able to verbalize types and ability to use techniques and skills needed for reducing  stress and depression.   Education: Stress, Anxiety, and Depression - Group verbal and visual presentation to define topics covered.  Reviews how body is impacted by stress, anxiety, and depression.  Also discusses healthy ways to  reduce stress and to treat/manage anxiety and depression.  Written material given at graduation.   Education: Sleep Hygiene -Provides group verbal and written instruction about how sleep can affect your health.  Define sleep hygiene, discuss sleep cycles and impact of sleep habits. Review good sleep hygiene tips.    Initial Review & Psychosocial Screening:  Initial Psych Review & Screening - 11/25/22 1010       Initial Review   Current issues with None Identified      Family Dynamics   Good Support System? Yes   adult son/daughter and wife     Barriers   Psychosocial barriers to participate in program There are no identifiable barriers or psychosocial needs.      Screening Interventions   Interventions Encouraged to exercise;To provide support and resources with identified psychosocial needs;Provide feedback about the scores to participant    Expected Outcomes Short Term goal: Utilizing psychosocial counselor, staff and physician to assist with identification of specific Stressors or current issues interfering with healing process. Setting desired goal for each stressor or current issue identified.;Long Term Goal: Stressors or current issues are controlled or eliminated.;Short Term goal: Identification and review with participant of any Quality of Life or Depression concerns found by scoring the questionnaire.;Long Term goal: The participant improves quality of Life and PHQ9 Scores as seen by post scores and/or verbalization of changes             Quality of Life Scores:   Quality of Life - 12/01/22 1438       Quality of Life   Select Quality of Life      Quality of Life Scores   Health/Function Pre 16.64 %    Socioeconomic Pre 21.13 %     Psych/Spiritual Pre 15.29 %    Family Pre 24 %    GLOBAL Pre 18.15 %            Scores of 19 and below usually indicate a poorer quality of life in these areas.  A difference of  2-3 points is a clinically meaningful difference.  A difference of 2-3 points in the total score of the Quality of Life Index has been associated with significant improvement in overall quality of life, self-image, physical symptoms, and general health in studies assessing change in quality of life.  PHQ-9: Review Flowsheet  More data exists      12/01/2022 10/06/2022 09/28/2022 09/30/2021 09/16/2020  Depression screen PHQ 2/9  Decreased Interest 0 0 0 0 0  Down, Depressed, Hopeless 0 0 0 0 0  PHQ - 2 Score 0 0 0 0 0  Altered sleeping 0 - - - -  Tired, decreased energy 1 - - - -  Change in appetite 0 - - - -  Feeling bad or failure about yourself  0 - - - -  Trouble concentrating 1 - - - -  Moving slowly or fidgety/restless 0 - - - -  Suicidal thoughts 0 - - - -  PHQ-9 Score 2 - - - -  Difficult doing work/chores Not difficult at all - - - -   Interpretation of Total Score  Total Score Depression Severity:  1-4 = Minimal depression, 5-9 = Mild depression, 10-14 = Moderate depression, 15-19 = Moderately severe depression, 20-27 = Severe depression   Psychosocial Evaluation and Intervention:  Psychosocial Evaluation - 11/25/22 1031       Psychosocial Evaluation & Interventions   Interventions Encouraged to exercise with the program and follow exercise  prescription    Comments Ranier has no barriers to attending the program. He lives with his wife and adult son. THey are his support along with his daughter.  He is undergoing chemo and is receiving a monthly dose. He continues to work Radio producer jobs. He wants to get back in shape he was in prior to his heart attack.  He is ready to get started    Expected Outcomes STG Attends all scheduled sessions, Progresses with his exercise,following the guidelines.  LTG He  continues with his exercise progression and following health guidelines    Continue Psychosocial Services  Follow up required by staff             Psychosocial Re-Evaluation:   Psychosocial Discharge (Final Psychosocial Re-Evaluation):   Vocational Rehabilitation: Provide vocational rehab assistance to qualifying candidates.   Vocational Rehab Evaluation & Intervention:  Vocational Rehab - 11/25/22 1013       Initial Vocational Rehab Evaluation & Intervention   Assessment shows need for Vocational Rehabilitation No      Vocational Rehab Re-Evaulation   Comments working part time  was self employed             Education: Education Goals: Education classes will be provided on a variety of topics geared toward better understanding of heart health and risk factor modification. Participant will state understanding/return demonstration of topics presented as noted by education test scores.  Learning Barriers/Preferences:   General Cardiac Education Topics:  AED/CPR: - Group verbal and written instruction with the use of models to demonstrate the basic use of the AED with the basic ABC's of resuscitation.   Anatomy and Cardiac Procedures: - Group verbal and visual presentation and models provide information about basic cardiac anatomy and function. Reviews the testing methods done to diagnose heart disease and the outcomes of the test results. Describes the treatment choices: Medical Management, Angioplasty, or Coronary Bypass Surgery for treating various heart conditions including Myocardial Infarction, Angina, Valve Disease, and Cardiac Arrhythmias.  Written material given at graduation.   Medication Safety: - Group verbal and visual instruction to review commonly prescribed medications for heart and lung disease. Reviews the medication, class of the drug, and side effects. Includes the steps to properly store meds and maintain the prescription regimen.  Written material  given at graduation.   Intimacy: - Group verbal instruction through game format to discuss how heart and lung disease can affect sexual intimacy. Written material given at graduation..   Know Your Numbers and Heart Failure: - Group verbal and visual instruction to discuss disease risk factors for cardiac and pulmonary disease and treatment options.  Reviews associated critical values for Overweight/Obesity, Hypertension, Cholesterol, and Diabetes.  Discusses basics of heart failure: signs/symptoms and treatments.  Introduces Heart Failure Zone chart for action plan for heart failure.  Written material given at graduation.   Infection Prevention: - Provides verbal and written material to individual with discussion of infection control including proper hand washing and proper equipment cleaning during exercise session. Flowsheet Row Cardiac Rehab from 12/01/2022 in [facility] Cardiac and Pulmonary Rehab  Date 12/01/22  Educator NT  Instruction Review Code 1- Verbalizes Understanding       Falls Prevention: - Provides verbal and written material to individual with discussion of falls prevention and safety. Flowsheet Row Cardiac Rehab from 12/01/2022 in [facility] Cardiac and Pulmonary Rehab  Date 11/25/22  Educator SB  Instruction Review Code 1- Verbalizes Understanding       Other: -Provides group and  verbal instruction on various topics (see comments)   Knowledge Questionnaire Score:  Knowledge Questionnaire Score - 12/01/22 1425       Knowledge Questionnaire Score   Pre Score 24/26             Core Components/Risk Factors/Patient Goals at Admission:  Personal Goals and Risk Factors at Admission - 11/25/22 1012       Core Components/Risk Factors/Patient Goals on Admission    Weight Management Yes    Intervention Weight Management: Provide education and appropriate resources to help participant work on and attain dietary goals.;Weight Management: Develop a combined nutrition and  exercise program designed to reach desired caloric intake, while maintaining appropriate intake of nutrient and fiber, sodium and fats, and appropriate energy expenditure required for the weight goal.    Admit Weight 175 lb (79.4 kg)    Goal Weight: Long Term 175 lb (79.4 kg)    Expected Outcomes Short Term: Continue to assess and modify interventions until short term weight is achieved;Long Term: Adherence to nutrition and physical activity/exercise program aimed toward attainment of established weight goal;Weight Maintenance: Understanding of the daily nutrition guidelines, which includes 25-35% calories from fat, 7% or less cal from saturated fats, less than 200mg  cholesterol, less than 1.5gm of sodium, & 5 or more servings of fruits and vegetables daily    Lipids Yes    Intervention Provide education and support for participant on nutrition & aerobic/resistive exercise along with prescribed medications to achieve LDL 70mg , HDL >40mg .    Expected Outcomes Short Term: Participant states understanding of desired cholesterol values and is compliant with medications prescribed. Participant is following exercise prescription and nutrition guidelines.;Long Term: Cholesterol controlled with medications as prescribed, with individualized exercise RX and with personalized nutrition plan. Value goals: LDL < 70mg , HDL > 40 mg.             Education:Diabetes - Individual verbal and written instruction to review signs/symptoms of diabetes, desired ranges of glucose level fasting, after meals and with exercise. Acknowledge that pre and post exercise glucose checks will be done for 3 sessions at entry of program.   Core Components/Risk Factors/Patient Goals Review:    Core Components/Risk Factors/Patient Goals at Discharge (Final Review):    ITP Comments:  ITP Comments     Row Name 11/25/22 1028 12/01/22 1309         ITP Comments Virtual orientation call completed today. he has an appointment on  Date: 12/01/2022  for EP eval and gym Orientation.  Documentation of diagnosis can be found in [facility] Date: 09/25/2022 . Completed and gym orientation. Initial ITP created and sent for review to Dr. Bethann Punches, Medical Director.               Comments: Initial ITP

## 2022-12-07 ENCOUNTER — Encounter: Payer: Medicare Other | Admitting: *Deleted

## 2022-12-07 DIAGNOSIS — Z955 Presence of coronary angioplasty implant and graft: Secondary | ICD-10-CM | POA: Diagnosis not present

## 2022-12-07 DIAGNOSIS — I213 ST elevation (STEMI) myocardial infarction of unspecified site: Secondary | ICD-10-CM

## 2022-12-07 NOTE — Progress Notes (Signed)
Daily Session Note  Patient Details  Name: GILLERMO POCH MRN: [id number] Date of Birth: 09/22/1945 Referring Provider:   Flowsheet Row Cardiac Rehab from 12/01/2022 in [facility] Cardiac and Pulmonary Rehab  Referring Provider Dr. Lorine Bears, MD       Encounter Date: 12/07/2022  Check In:      Social History   Tobacco Use  Smoking Status Never  Smokeless Tobacco Never    Goals Met:  Independence with exercise equipment Exercise tolerated well No report of concerns or symptoms today  Goals Unmet:  Not Applicable  Comments: First full day of exercise!  Patient was oriented to gym and equipment including functions, settings, policies, and procedures.  Patient's individual exercise prescription and treatment plan were reviewed.  All starting workloads were established based on the results of the 6 minute walk test done at initial orientation visit.  The plan for exercise progression was also introduced and progression will be customized based on patient's performance and goals.    Dr. Bethann Punches is Medical Director for [facility] Cardiac Rehabilitation.  Dr. Vida Rigger is Medical Director for [facility] Pulmonary Rehabilitation.

## 2022-12-09 ENCOUNTER — Other Ambulatory Visit (HOSPITAL_COMMUNITY): Payer: Self-pay

## 2022-12-09 ENCOUNTER — Encounter: Payer: Medicare Other | Admitting: *Deleted

## 2022-12-09 ENCOUNTER — Encounter: Payer: Self-pay | Admitting: *Deleted

## 2022-12-09 DIAGNOSIS — I213 ST elevation (STEMI) myocardial infarction of unspecified site: Secondary | ICD-10-CM

## 2022-12-09 DIAGNOSIS — Z955 Presence of coronary angioplasty implant and graft: Secondary | ICD-10-CM

## 2022-12-09 NOTE — Progress Notes (Signed)
Daily Session Note  Patient Details  Name: Larry Kent MRN: [id number] Date of Birth: [birth year] Referring Provider:   Flowsheet Row Cardiac Rehab from 12/01/2022 in [facility] Cardiac and Pulmonary Rehab  Referring Provider Dr. Lorine Bears, MD       Encounter Date: 12/09/2022  Check In:  Session Check In - 12/09/22 1105       Check-In   Supervising physician immediately available to respond to emergencies See telemetry face sheet for immediately available ER MD    Location [facility]-Cardiac & Pulmonary Rehab    Staff Present Susann Givens, RN BSN;Noah Tickle, BS, Exercise Physiologist;Joseph Elberta, RCP,RRT,BSRT;Jessica [place], [place], RCEP, CCRP, CCET    Virtual Visit No    Medication changes reported     No    Fall or balance concerns reported    No    Warm-up and Cool-down Performed on first and last piece of equipment    Resistance Training Performed Yes    VAD Patient? No    PAD/SET Patient? No      Pain Assessment   Currently in Pain? No/denies                Social History   Tobacco Use  Smoking Status Never  Smokeless Tobacco Never    Goals Met:  Independence with exercise equipment Exercise tolerated well No report of concerns or symptoms today Strength training completed today  Goals Unmet:  Not Applicable  Comments: Pt able to follow exercise prescription today without complaint.  Will continue to monitor for progression.    Dr. Bethann Punches is Medical Director for [facility] Cardiac Rehabilitation.  Dr. Vida Rigger is Medical Director for [facility] Pulmonary Rehabilitation.

## 2022-12-09 NOTE — Progress Notes (Signed)
Cardiac Individual Treatment Plan  Patient Details  Name: Larry Kent MRN: [id number] Date of Birth: Feb 28, 1946 Referring Provider:   Flowsheet Row Cardiac Rehab from 12/01/2022 in [facility] Cardiac and Pulmonary Rehab  Referring Provider Dr. Lorine Bears, MD       Initial Encounter Date:  Flowsheet Row Cardiac Rehab from 12/01/2022 in [facility] Cardiac and Pulmonary Rehab  Date 12/01/22       Visit Diagnosis: ST elevation myocardial infarction (STEMI), unspecified artery [facility])  Status post coronary artery stent placement  Patient's Home Medications on Admission:  Current Outpatient Medications:    abiraterone acetate (ZYTIGA) 250 MG tablet, Take 1,000 mg by mouth daily., Disp: , Rfl:    acetaminophen (TYLENOL) 500 MG tablet, Take 1,000 mg by mouth every 6 (six) hours as needed for mild pain or headache., Disp: , Rfl:    Calcium Carb-Cholecalciferol (RA CALCIUM PLUS VITAMIN D) 600-10 MG-MCG TABS, Take 600 mg by mouth daily., Disp: , Rfl:    fluticasone (FLONASE) 50 MCG/ACT nasal spray, Place 1 spray into both nostrils daily as needed for allergies or rhinitis., Disp: , Rfl:    lidocaine-prilocaine (EMLA) cream, Apply to the Port-A-Cath site 30-60 minutes before chemotherapy treatment (Patient taking differently: Apply 1 Application topically See admin instructions. Apply to the Port-A-Cath site 30-60 minutes before chemotherapy treatment), Disp: 30 g, Rfl: 0   metoprolol tartrate (LOPRESSOR) 25 MG tablet, Take 0.5 tablets (12.5 mg total) by mouth 2 (two) times daily., Disp: 30 tablet, Rfl: 11   Multiple Vitamin (MULTIVITAMIN) tablet, Take 1 tablet by mouth daily with breakfast., Disp: , Rfl:    predniSONE (DELTASONE) 5 MG tablet, Take 5 mg by mouth daily., Disp: , Rfl:    prochlorperazine (COMPAZINE) 10 MG tablet, Take 1 tablet (10 mg total) by mouth every 6 (six) hours as needed for nausea or vomiting., Disp: 30 tablet, Rfl: 0   rosuvastatin (CRESTOR) 20 MG tablet, TAKE 1 TABLET BY MOUTH  EVERY DAY (Patient taking differently: Take 20 mg by mouth in the morning.), Disp: 90 tablet, Rfl: 1   SYSTANE ULTRA PF 0.4-0.3 % SOLN, Place 1 drop into both eyes 3 (three) times daily as needed (for dryness/irritation)., Disp: 10 each, Rfl: 1   tamsulosin (FLOMAX) 0.4 MG CAPS capsule, Take 0.4 mg by mouth 2 (two) times daily., Disp: , Rfl:    ticagrelor (BRILINTA) 90 MG TABS tablet, Take 1 tablet (90 mg total) by mouth 2 (two) times daily., Disp: 60 tablet, Rfl: 11   Zinc 25 MG TABS, Take 25 mg by mouth daily., Disp: , Rfl:   Past Medical History: Past Medical History:  Diagnosis Date   Allergy    Arthritis    both knees    BCC (basal cell carcinoma), arm, right 09/2019   MOHS ([place])   BCC (basal cell carcinoma), face 2014   L preauricular s/p MOHS   BCC (basal cell carcinoma), face 02/2018   L upper lip   BPH (benign prostatic hypertrophy)    Dr. Mena Goes @ [facility]   Carotid stenosis 05/2015   RICA 40-59%, LICA 1-39%, L vertebral occlusion, rpt 1 yr   Cataract    removed both eyes    FHx: colon cancer    FHx: prostate cancer    Heart murmur    mild aortic stenosis    History of kidney stones    Hyperlipidemia    borderline- on rosuvastatin now normal    Occlusion of right vertebral artery 05/2015    Tobacco Use:  Social History   Tobacco Use  Smoking Status Never  Smokeless Tobacco Never    Labs: Review Flowsheet  More data exists      Latest Ref Rng & Units 09/09/2020 09/23/2021 07/28/2022 08/21/2022 09/07/2022  Labs for ITP Cardiac and Pulmonary Rehab  Cholestrol 0 - 200 mg/dL 161  096  - - 045   LDL (calc) 0 - 99 mg/dL 61  61  - - 64   HDL-C >40 mg/dL 40.98  11.91  - - 62   Trlycerides <150 mg/dL 47.8  29.5  - - 55   Hemoglobin A1c 4.8 - 5.6 % 6.0  6.1  - - 6.8   TCO2 22 - 32 mmol/L - - 25  22  21       Exercise Target Goals: Exercise Program Goal: Individual exercise prescription set using results from initial 6 min walk test and THRR while considering   patient's activity barriers and safety.   Exercise Prescription Goal: Initial exercise prescription builds to 30-45 minutes a day of aerobic activity, 2-3 days per week.  Home exercise guidelines will be given to patient during program as part of exercise prescription that the participant will acknowledge.   Education: Aerobic Exercise: - Group verbal and visual presentation on the components of exercise prescription. Introduces F.I.T.T principle from ACSM for exercise prescriptions.  Reviews F.I.T.T. principles of aerobic exercise including progression. Written material given at graduation. Flowsheet Row Cardiac Rehab from 12/01/2022 in [facility] Cardiac and Pulmonary Rehab  Education need identified 12/01/22       Education: Resistance Exercise: - Group verbal and visual presentation on the components of exercise prescription. Introduces F.I.T.T principle from ACSM for exercise prescriptions  Reviews F.I.T.T. principles of resistance exercise including progression. Written material given at graduation.    Education: Exercise & Equipment Safety: - Individual verbal instruction and demonstration of equipment use and safety with use of the equipment. Flowsheet Row Cardiac Rehab from 12/01/2022 in [facility] Cardiac and Pulmonary Rehab  Date 12/01/22  Educator NT  Instruction Review Code 1- Verbalizes Understanding       Education: Exercise Physiology & General Exercise Guidelines: - Group verbal and written instruction with models to review the exercise physiology of the cardiovascular system and associated critical values. Provides general exercise guidelines with specific guidelines to those with heart or lung disease.    Education: Flexibility, Balance, Mind/Body Relaxation: - Group verbal and visual presentation with interactive activity on the components of exercise prescription. Introduces F.I.T.T principle from ACSM for exercise prescriptions. Reviews F.I.T.T. principles of flexibility and  balance exercise training including progression. Also discusses the mind body connection.  Reviews various relaxation techniques to help reduce and manage stress (i.e. Deep breathing, progressive muscle relaxation, and visualization). Balance handout provided to take home. Written material given at graduation.   Activity Barriers & Risk Stratification:  Activity Barriers & Cardiac Risk Stratification - 12/01/22 1451       Activity Barriers & Cardiac Risk Stratification   Activity Barriers Other (comment);Back Problems;Arthritis    Comments R knee pain    Cardiac Risk Stratification Moderate             6 Minute Walk:  6 Minute Walk     Row Name 12/01/22 1450         6 Minute Walk   Phase Initial     Distance 1295 feet     Walk Time 6 minutes     # of Rest Breaks 0     MPH  2.45     METS 2.5     RPE 7     Perceived Dyspnea  0     VO2 Peak 8.74     Symptoms No     Resting HR 60 bpm     Resting BP 102/50     Resting Oxygen Saturation  97 %     Exercise Oxygen Saturation  during 6 min walk 99 %     Max Ex. HR 91 bpm     Max Ex. BP 144/66     2 Minute Post BP 122/62              Oxygen Initial Assessment:   Oxygen Re-Evaluation:   Oxygen Discharge (Final Oxygen Re-Evaluation):   Initial Exercise Prescription:  Initial Exercise Prescription - 12/01/22 1400       Date of Initial Exercise RX and Referring Provider   Date 12/01/22    Referring Provider Dr. Lorine Bears, MD      Oxygen   Maintain Oxygen Saturation 88% or higher      Treadmill   MPH 2    Grade 0    Minutes 15    METs 2.53      NuStep   Level 2    SPM 80    Minutes 15    METs 2.5      REL-XR   Level 2    Speed 50    Minutes 15    METs 2.5      T5 Nustep   Level 1    SPM 80    Minutes 15    METs 2.5      Prescription Details   Frequency (times per week) 3    Duration Progress to 30 minutes of continuous aerobic without signs/symptoms of physical distress       Intensity   THRR 40-80% of Max Heartrate 93-126    Ratings of Perceived Exertion 11-13    Perceived Dyspnea 0-4      Progression   Progression Continue to progress workloads to maintain intensity without signs/symptoms of physical distress.      Resistance Training   Training Prescription Yes    Weight 4 lb    Reps 10-15             Perform Capillary Blood Glucose checks as needed.  Exercise Prescription Changes:   Exercise Prescription Changes     Row Name 12/01/22 1400 12/07/22 1600           Response to Exercise   Blood Pressure (Admit) 102/50 108/52      Blood Pressure (Exercise) 144/66 122/58      Blood Pressure (Exit) 122/62 110/58      Heart Rate (Admit) 60 bpm 64 bpm      Heart Rate (Exercise) 91 bpm 94 bpm      Heart Rate (Exit) 59 bpm 76 bpm      Oxygen Saturation (Admit) 97 % --      Oxygen Saturation (Exercise) 99 % --      Rating of Perceived Exertion (Exercise) 7 12      Perceived Dyspnea (Exercise) 0 --      Symptoms None none      Comments Results first full day of exercise      Duration -- Progress to 30 minutes of  aerobic without signs/symptoms of physical distress      Intensity -- THRR unchanged        Progression  Progression -- Continue to progress workloads to maintain intensity without signs/symptoms of physical distress.      Average METs -- 2.46        Resistance Training   Training Prescription -- Yes      Weight -- 4 lb      Reps -- 10-15        Interval Training   Interval Training -- No        Treadmill   MPH -- 2      Grade -- 0      Minutes -- 15      METs -- 2.53        NuStep   Level -- 2      Minutes -- 15      METs -- 2.4        Oxygen   Maintain Oxygen Saturation -- 88% or higher               Exercise Comments:   Exercise Comments     Row Name 12/07/22 1145           Exercise Comments First full day of exercise!  Patient was oriented to gym and equipment including functions, settings,  policies, and procedures.  Patient's individual exercise prescription and treatment plan were reviewed.  All starting workloads were established based on the results of the 6 minute walk test done at initial orientation visit.  The plan for exercise progression was also introduced and progression will be customized based on patient's performance and goals.                Exercise Goals and Review:   Exercise Goals     Row Name 12/01/22 1309             Exercise Goals   Increase Physical Activity Yes       Intervention Develop an individualized exercise prescription for aerobic and resistive training based on initial evaluation findings, risk stratification, comorbidities and participant's personal goals.;Provide advice, education, support and counseling about physical activity/exercise needs.       Expected Outcomes Long Term: Exercising regularly at least 3-5 days a week.;Long Term: Add in home exercise to make exercise part of routine and to increase amount of physical activity.;Short Term: Attend rehab on a regular basis to increase amount of physical activity.       Increase Strength and Stamina Yes       Intervention Develop an individualized exercise prescription for aerobic and resistive training based on initial evaluation findings, risk stratification, comorbidities and participant's personal goals.;Provide advice, education, support and counseling about physical activity/exercise needs.       Expected Outcomes Short Term: Increase workloads from initial exercise prescription for resistance, speed, and METs.;Short Term: Perform resistance training exercises routinely during rehab and add in resistance training at home;Long Term: Improve cardiorespiratory fitness, muscular endurance and strength as measured by increased METs and functional capacity ( )       Able to understand and use rate of perceived exertion (RPE) scale Yes       Intervention Provide education and explanation  on how to use RPE scale       Expected Outcomes Short Term: Able to use RPE daily in rehab to express subjective intensity level;Long Term:  Able to use RPE to guide intensity level when exercising independently       Able to understand and use Dyspnea scale Yes       Intervention Provide education and explanation  on how to use Dyspnea scale       Expected Outcomes Short Term: Able to use Dyspnea scale daily in rehab to express subjective sense of shortness of breath during exertion;Long Term: Able to use Dyspnea scale to guide intensity level when exercising independently       Knowledge and understanding of Target Heart Rate Range (THRR) Yes       Intervention Provide education and explanation of THRR including how the numbers were predicted and where they are located for reference       Expected Outcomes Long Term: Able to use THRR to govern intensity when exercising independently;Short Term: Able to state/look up THRR;Short Term: Able to use daily as guideline for intensity in rehab       Able to check pulse independently Yes       Intervention Provide education and demonstration on how to check pulse in carotid and radial arteries.;Review the importance of being able to check your own pulse for safety during independent exercise       Expected Outcomes Short Term: Able to explain why pulse checking is important during independent exercise;Long Term: Able to check pulse independently and accurately       Understanding of Exercise Prescription Yes       Intervention Provide education, explanation, and written materials on patient's individual exercise prescription       Expected Outcomes Short Term: Able to explain program exercise prescription;Long Term: Able to explain home exercise prescription to exercise independently                Exercise Goals Re-Evaluation :  Exercise Goals Re-Evaluation     Row Name 12/07/22 1146             Exercise Goal Re-Evaluation   Exercise Goals  Review Increase Physical Activity;Increase Strength and Stamina;Able to understand and use rate of perceived exertion (RPE) scale;Knowledge and understanding of Target Heart Rate Range (THRR);Able to check pulse independently;Understanding of Exercise Prescription       Comments Reviewed RPE  and dyspnea scale, THR and program prescription with pt today.  Pt voiced understanding and was given a copy of goals to take home.       Expected Outcomes Short: Use RPE daily to regulate intensity.  Long: Follow program prescription in THR.                Discharge Exercise Prescription (Final Exercise Prescription Changes):  Exercise Prescription Changes - 12/07/22 1600       Response to Exercise   Blood Pressure (Admit) 108/52    Blood Pressure (Exercise) 122/58    Blood Pressure (Exit) 110/58    Heart Rate (Admit) 64 bpm    Heart Rate (Exercise) 94 bpm    Heart Rate (Exit) 76 bpm    Rating of Perceived Exertion (Exercise) 12    Symptoms none    Comments first full day of exercise    Duration Progress to 30 minutes of  aerobic without signs/symptoms of physical distress    Intensity THRR unchanged      Progression   Progression Continue to progress workloads to maintain intensity without signs/symptoms of physical distress.    Average METs 2.46      Resistance Training   Training Prescription Yes    Weight 4 lb    Reps 10-15      Interval Training   Interval Training No      Treadmill   MPH 2    Grade  0    Minutes 15    METs 2.53      NuStep   Level 2    Minutes 15    METs 2.4      Oxygen   Maintain Oxygen Saturation 88% or higher             Nutrition:  Target Goals: Understanding of nutrition guidelines, daily intake of sodium 1500mg , cholesterol 200mg , calories 30% from fat and 7% or less from saturated fats, daily to have 5 or more servings of fruits and vegetables.  Education: All About Nutrition: -Group instruction provided by verbal, written material,  interactive activities, discussions, models, and posters to present general guidelines for heart healthy nutrition including fat, fiber, MyPlate, the role of sodium in heart healthy nutrition, utilization of the nutrition label, and utilization of this knowledge for meal planning. Follow up email sent as well. Written material given at graduation. Flowsheet Row Cardiac Rehab from 12/01/2022 in [facility] Cardiac and Pulmonary Rehab  Education need identified 12/01/22       Biometrics:  Pre Biometrics - 12/01/22 1455       Pre Biometrics   Height 5' 7.5" (1.715 m)    Weight 180 lb 3.2 oz (81.7 kg)    Waist Circumference 37.5 inches    Hip Circumference 39.5 inches    Waist to Hip Ratio 0.95 %    BMI (Calculated) 27.79    Single Leg Stand 12.7 seconds              Nutrition Therapy Plan and Nutrition Goals:  Nutrition Therapy & Goals - 12/01/22 1426       Intervention Plan   Intervention Prescribe, educate and counsel regarding individualized specific dietary modifications aiming towards targeted core components such as weight, hypertension, lipid management, diabetes, heart failure and other comorbidities.    Expected Outcomes Short Term Goal: Understand basic principles of dietary content, such as calories, fat, sodium, cholesterol and nutrients.;Short Term Goal: A plan has been developed with personal nutrition goals set during dietitian appointment.;Long Term Goal: Adherence to prescribed nutrition plan.             Nutrition Assessments:  MEDIFICTS Score Key: ?70 Need to make dietary changes  40-70 Heart Healthy Diet ? 40 Therapeutic Level Cholesterol Diet  Flowsheet Row Cardiac Rehab from 12/01/2022 in [facility] Cardiac and Pulmonary Rehab  Picture Your Plate Total Score on Admission 63      Picture Your Plate Scores: <40 Unhealthy dietary pattern with much room for improvement. 41-50 Dietary pattern unlikely to meet recommendations for good health and room for  improvement. 51-60 More healthful dietary pattern, with some room for improvement.  >60 Healthy dietary pattern, although there may be some specific behaviors that could be improved.    Nutrition Goals Re-Evaluation:   Nutrition Goals Discharge (Final Nutrition Goals Re-Evaluation):   Psychosocial: Target Goals: Acknowledge presence or absence of significant depression and/or stress, maximize coping skills, provide positive support system. Participant is able to verbalize types and ability to use techniques and skills needed for reducing stress and depression.   Education: Stress, Anxiety, and Depression - Group verbal and visual presentation to define topics covered.  Reviews how body is impacted by stress, anxiety, and depression.  Also discusses healthy ways to reduce stress and to treat/manage anxiety and depression.  Written material given at graduation.   Education: Sleep Hygiene -Provides group verbal and written instruction about how sleep can affect your health.  Define sleep hygiene, discuss sleep  cycles and impact of sleep habits. Review good sleep hygiene tips.    Initial Review & Psychosocial Screening:  Initial Psych Review & Screening - 11/25/22 1010       Initial Review   Current issues with None Identified      Family Dynamics   Good Support System? Yes   adult son/daughter and wife     Barriers   Psychosocial barriers to participate in program There are no identifiable barriers or psychosocial needs.      Screening Interventions   Interventions Encouraged to exercise;To provide support and resources with identified psychosocial needs;Provide feedback about the scores to participant    Expected Outcomes Short Term goal: Utilizing psychosocial counselor, staff and physician to assist with identification of specific Stressors or current issues interfering with healing process. Setting desired goal for each stressor or current issue identified.;Long Term Goal:  Stressors or current issues are controlled or eliminated.;Short Term goal: Identification and review with participant of any Quality of Life or Depression concerns found by scoring the questionnaire.;Long Term goal: The participant improves quality of Life and PHQ9 Scores as seen by post scores and/or verbalization of changes             Quality of Life Scores:   Quality of Life - 12/01/22 1438       Quality of Life   Select Quality of Life      Quality of Life Scores   Health/Function Pre 16.64 %    Socioeconomic Pre 21.13 %    Psych/Spiritual Pre 15.29 %    Family Pre 24 %    GLOBAL Pre 18.15 %            Scores of 19 and below usually indicate a poorer quality of life in these areas.  A difference of  2-3 points is a clinically meaningful difference.  A difference of 2-3 points in the total score of the Quality of Life Index has been associated with significant improvement in overall quality of life, self-image, physical symptoms, and general health in studies assessing change in quality of life.  PHQ-9: Review Flowsheet  More data exists      12/01/2022 10/06/2022 09/28/2022 09/30/2021 09/16/2020  Depression screen PHQ 2/9  Decreased Interest 0 0 0 0 0  Down, Depressed, Hopeless 0 0 0 0 0  PHQ - 2 Score 0 0 0 0 0  Altered sleeping 0 - - - -  Tired, decreased energy 1 - - - -  Change in appetite 0 - - - -  Feeling bad or failure about yourself  0 - - - -  Trouble concentrating 1 - - - -  Moving slowly or fidgety/restless 0 - - - -  Suicidal thoughts 0 - - - -  PHQ-9 Score 2 - - - -  Difficult doing work/chores Not difficult at all - - - -   Interpretation of Total Score  Total Score Depression Severity:  1-4 = Minimal depression, 5-9 = Mild depression, 10-14 = Moderate depression, 15-19 = Moderately severe depression, 20-27 = Severe depression   Psychosocial Evaluation and Intervention:  Psychosocial Evaluation - 11/25/22 1031       Psychosocial Evaluation &  Interventions   Interventions Encouraged to exercise with the program and follow exercise prescription    Comments Larry Kent has no barriers to attending the program. He lives with his wife and adult son. THey are his support along with his daughter.  He is undergoing chemo and is receiving  a monthly dose. He continues to work Radio producer jobs. He wants to get back in shape he was in prior to his heart attack.  He is ready to get started    Expected Outcomes STG Attends all scheduled sessions, Progresses with his exercise,following the guidelines.  LTG He continues with his exercise progression and following health guidelines    Continue Psychosocial Services  Follow up required by staff             Psychosocial Re-Evaluation:   Psychosocial Discharge (Final Psychosocial Re-Evaluation):   Vocational Rehabilitation: Provide vocational rehab assistance to qualifying candidates.   Vocational Rehab Evaluation & Intervention:  Vocational Rehab - 11/25/22 1013       Initial Vocational Rehab Evaluation & Intervention   Assessment shows need for Vocational Rehabilitation No      Vocational Rehab Re-Evaulation   Comments working part time  was self employed             Education: Education Goals: Education classes will be provided on a variety of topics geared toward better understanding of heart health and risk factor modification. Participant will state understanding/return demonstration of topics presented as noted by education test scores.  Learning Barriers/Preferences:   General Cardiac Education Topics:  AED/CPR: - Group verbal and written instruction with the use of models to demonstrate the basic use of the AED with the basic ABC's of resuscitation.   Anatomy and Cardiac Procedures: - Group verbal and visual presentation and models provide information about basic cardiac anatomy and function. Reviews the testing methods done to diagnose heart disease and the outcomes of  the test results. Describes the treatment choices: Medical Management, Angioplasty, or Coronary Bypass Surgery for treating various heart conditions including Myocardial Infarction, Angina, Valve Disease, and Cardiac Arrhythmias.  Written material given at graduation.   Medication Safety: - Group verbal and visual instruction to review commonly prescribed medications for heart and lung disease. Reviews the medication, class of the drug, and side effects. Includes the steps to properly store meds and maintain the prescription regimen.  Written material given at graduation.   Intimacy: - Group verbal instruction through game format to discuss how heart and lung disease can affect sexual intimacy. Written material given at graduation..   Know Your Numbers and Heart Failure: - Group verbal and visual instruction to discuss disease risk factors for cardiac and pulmonary disease and treatment options.  Reviews associated critical values for Overweight/Obesity, Hypertension, Cholesterol, and Diabetes.  Discusses basics of heart failure: signs/symptoms and treatments.  Introduces Heart Failure Zone chart for action plan for heart failure.  Written material given at graduation.   Infection Prevention: - Provides verbal and written material to individual with discussion of infection control including proper hand washing and proper equipment cleaning during exercise session. Flowsheet Row Cardiac Rehab from 12/01/2022 in [facility] Cardiac and Pulmonary Rehab  Date 12/01/22  Educator NT  Instruction Review Code 1- Verbalizes Understanding       Falls Prevention: - Provides verbal and written material to individual with discussion of falls prevention and safety. Flowsheet Row Cardiac Rehab from 12/01/2022 in [facility] Cardiac and Pulmonary Rehab  Date 11/25/22  Educator SB  Instruction Review Code 1- Verbalizes Understanding       Other: -Provides group and verbal instruction on various topics (see  comments)   Knowledge Questionnaire Score:  Knowledge Questionnaire Score - 12/01/22 1425       Knowledge Questionnaire Score   Pre Score 24/26  Core Components/Risk Factors/Patient Goals at Admission:  Personal Goals and Risk Factors at Admission - 11/25/22 1012       Core Components/Risk Factors/Patient Goals on Admission    Weight Management Yes    Intervention Weight Management: Provide education and appropriate resources to help participant work on and attain dietary goals.;Weight Management: Develop a combined nutrition and exercise program designed to reach desired caloric intake, while maintaining appropriate intake of nutrient and fiber, sodium and fats, and appropriate energy expenditure required for the weight goal.    Admit Weight 175 lb (79.4 kg)    Goal Weight: Long Term 175 lb (79.4 kg)    Expected Outcomes Short Term: Continue to assess and modify interventions until short term weight is achieved;Long Term: Adherence to nutrition and physical activity/exercise program aimed toward attainment of established weight goal;Weight Maintenance: Understanding of the daily nutrition guidelines, which includes 25-35% calories from fat, 7% or less cal from saturated fats, less than 200mg  cholesterol, less than 1.5gm of sodium, & 5 or more servings of fruits and vegetables daily    Lipids Yes    Intervention Provide education and support for participant on nutrition & aerobic/resistive exercise along with prescribed medications to achieve LDL 70mg , HDL >40mg .    Expected Outcomes Short Term: Participant states understanding of desired cholesterol values and is compliant with medications prescribed. Participant is following exercise prescription and nutrition guidelines.;Long Term: Cholesterol controlled with medications as prescribed, with individualized exercise RX and with personalized nutrition plan. Value goals: LDL < 70mg , HDL > 40 mg.              Education:Diabetes - Individual verbal and written instruction to review signs/symptoms of diabetes, desired ranges of glucose level fasting, after meals and with exercise. Acknowledge that pre and post exercise glucose checks will be done for 3 sessions at entry of program.   Core Components/Risk Factors/Patient Goals Review:    Core Components/Risk Factors/Patient Goals at Discharge (Final Review):    ITP Comments:  ITP Comments     Row Name 11/25/22 1028 12/01/22 1309 12/07/22 1144 12/09/22 0731     ITP Comments Virtual orientation call completed today. he has an appointment on Date: 12/01/2022  for EP eval and gym Orientation.  Documentation of diagnosis can be found in [facility] Date: 09/25/2022 . Completed and gym orientation. Initial ITP created and sent for review to Dr. Bethann Punches, Medical Director. First full day of exercise!  Patient was oriented to gym and equipment including functions, settings, policies, and procedures.  Patient's individual exercise prescription and treatment plan were reviewed.  All starting workloads were established based on the results of the 6 minute walk test done at initial orientation visit.  The plan for exercise progression was also introduced and progression will be customized based on patient's performance and goals. 30 Day review completed. Medical Director ITP review done, changes made as directed, and signed approval by Medical Director.   new to program             Comments:

## 2022-12-11 ENCOUNTER — Telehealth (HOSPITAL_COMMUNITY): Payer: Self-pay | Admitting: Emergency Medicine

## 2022-12-11 DIAGNOSIS — I25118 Atherosclerotic heart disease of native coronary artery with other forms of angina pectoris: Secondary | ICD-10-CM

## 2022-12-11 NOTE — Telephone Encounter (Signed)
Done

## 2022-12-14 ENCOUNTER — Telehealth (HOSPITAL_COMMUNITY): Payer: Self-pay | Admitting: Emergency Medicine

## 2022-12-14 NOTE — Telephone Encounter (Signed)
Reaching out to patient to offer assistance regarding upcoming cardiac imaging study; pt verbalizes understanding of appt date/time, parking situation and where to check in, pre-test NPO status and medications ordered, and verified current allergies; name and call back number provided for further questions should they arise Anett Ranker RN Navigator Cardiac Imaging [facility] [phone] office [phone] cell 

## 2022-12-15 ENCOUNTER — Encounter (HOSPITAL_COMMUNITY)
Admission: RE | Admit: 2022-12-15 | Discharge: 2022-12-15 | Disposition: A | Discharge status: Home / Self Care | Payer: Medicare Other | Source: Ambulatory Visit | Attending: Cardiology | Admitting: Cardiology

## 2022-12-15 DIAGNOSIS — R918 Other nonspecific abnormal finding of lung field: Secondary | ICD-10-CM | POA: Diagnosis not present

## 2022-12-15 DIAGNOSIS — I2119 ST elevation (STEMI) myocardial infarction involving other coronary artery of inferior wall: Secondary | ICD-10-CM

## 2022-12-15 LAB — NM PET CT CARDIAC PERFUSION MULTI W/ABSOLUTE BLOODFLOW
LV dias vol: 81 mL (ref 62–150)
LV sys vol: 32 mL
MBFR: 1.86
Nuc Rest EF: 55 %
Nuc Stress EF: 60 %
Rest MBF: 0.96 ml/g/min
Rest Nuclear Isotope Dose: 21.1 mCi
ST Depression (mm): 0 mm
Stress MBF: 1.79 ml/g/min
Stress Nuclear Isotope Dose: 21.3 mCi
TID: 0.9

## 2022-12-15 MED ORDER — RUBIDIUM RB82 GENERATOR (RUBYFILL)
21.3100 | PACK | Freq: Once | INTRAVENOUS | Status: AC
  Administered 2022-12-15: 21.31 via INTRAVENOUS

## 2022-12-15 MED ORDER — RUBIDIUM RB82 GENERATOR (RUBYFILL)
21.0500 | PACK | Freq: Once | INTRAVENOUS | Status: AC
  Administered 2022-12-15: 21.05 via INTRAVENOUS

## 2022-12-15 MED ORDER — REGADENOSON 0.4 MG/5ML IV SOLN
INTRAVENOUS | Status: AC
  Filled 2022-12-15: qty 5

## 2022-12-15 MED ORDER — REGADENOSON 0.4 MG/5ML IV SOLN
0.4000 mg | Freq: Once | INTRAVENOUS | Status: AC
  Administered 2022-12-15: 0.4 mg via INTRAVENOUS

## 2022-12-15 NOTE — Progress Notes (Signed)
Patient presents for a cardiac PET stress test and tolerated procedure without incident. Patient maintained acceptable vital signs throughout the test and was offered caffeine after test.  Patient reports mild "jitteriness" that resolved by end of study. Patient wheeled out of department by staff.

## 2022-12-16 ENCOUNTER — Encounter: Payer: Medicare Other | Admitting: *Deleted

## 2022-12-16 DIAGNOSIS — I213 ST elevation (STEMI) myocardial infarction of unspecified site: Secondary | ICD-10-CM

## 2022-12-16 DIAGNOSIS — Z955 Presence of coronary angioplasty implant and graft: Secondary | ICD-10-CM | POA: Diagnosis not present

## 2022-12-16 NOTE — Progress Notes (Signed)
Daily Session Note  Patient Details  Name: STRIDER ALI MRN: [id number] Date of Birth: [birth year] Referring Provider:   Flowsheet Row Cardiac Rehab from 12/01/2022 in [facility] Cardiac and Pulmonary Rehab  Referring Provider Dr. Lorine Bears, MD       Encounter Date: 12/16/2022  Check In:  Session Check In - 12/16/22 1119       Check-In   Supervising physician immediately available to respond to emergencies See telemetry face sheet for immediately available ER MD    Location [facility]-Cardiac & Pulmonary Rehab    Staff Present Lanny Hurst, RN, ADN;Kristen Coble, RN,BC,MSN;Joseph Rod Can, RN BSN    Virtual Visit No    Medication changes reported     No    Fall or balance concerns reported    No    Warm-up and Cool-down Performed on first and last piece of equipment    Resistance Training Performed Yes    VAD Patient? No    PAD/SET Patient? No      Pain Assessment   Currently in Pain? No/denies                Social History   Tobacco Use  Smoking Status Never  Smokeless Tobacco Never    Goals Met:  Independence with exercise equipment Exercise tolerated well No report of concerns or symptoms today Strength training completed today  Goals Unmet:  Not Applicable  Comments: Pt able to follow exercise prescription today without complaint.  Will continue to monitor for progression.    Dr. Bethann Punches is Medical Director for [facility] Cardiac Rehabilitation.  Dr. Vida Rigger is Medical Director for [facility] Pulmonary Rehabilitation.

## 2022-12-18 ENCOUNTER — Encounter: Payer: Medicare Other | Admitting: *Deleted

## 2022-12-18 DIAGNOSIS — I213 ST elevation (STEMI) myocardial infarction of unspecified site: Secondary | ICD-10-CM | POA: Diagnosis not present

## 2022-12-18 DIAGNOSIS — Z955 Presence of coronary angioplasty implant and graft: Secondary | ICD-10-CM

## 2022-12-18 NOTE — Progress Notes (Signed)
Daily Session Note  Patient Details  Name: Larry Kent MRN: [id number] Date of Birth: January 03, 1946 Referring Provider:   Flowsheet Row Cardiac Rehab from 12/01/2022 in [facility] Cardiac and Pulmonary Rehab  Referring Provider Dr. Lorine Bears, MD       Encounter Date: 12/18/2022  Check In:  Session Check In - 12/18/22 1154       Check-In   Supervising physician immediately available to respond to emergencies See telemetry face sheet for immediately available ER MD    Location [facility]-Cardiac & Pulmonary Rehab    Staff Present Cora Collum, RN, BSN, CCRP;Megan Katrinka Blazing, RN, [place];Other   [place] [employer] HFS   Virtual Visit No    Medication changes reported     No    Fall or balance concerns reported    No    Warm-up and Cool-down Performed on first and last piece of equipment    Resistance Training Performed Yes    VAD Patient? No    PAD/SET Patient? No      Pain Assessment   Currently in Pain? No/denies                Social History   Tobacco Use  Smoking Status Never  Smokeless Tobacco Never    Goals Met:  Independence with exercise equipment Exercise tolerated well No report of concerns or symptoms today  Goals Unmet:  Not Applicable  Comments: Pt able to follow exercise prescription today without complaint.  Will continue to monitor for progression.    Dr. Bethann Punches is Medical Director for [facility] Cardiac Rehabilitation.  Dr. Vida Rigger is Medical Director for [facility] Pulmonary Rehabilitation.

## 2022-12-20 ENCOUNTER — Other Ambulatory Visit: Payer: Self-pay | Admitting: Family Medicine

## 2022-12-20 DIAGNOSIS — E785 Hyperlipidemia, unspecified: Secondary | ICD-10-CM

## 2022-12-20 DIAGNOSIS — D696 Thrombocytopenia, unspecified: Secondary | ICD-10-CM

## 2022-12-20 DIAGNOSIS — C7A1 Malignant poorly differentiated neuroendocrine tumors: Secondary | ICD-10-CM

## 2022-12-20 DIAGNOSIS — R7303 Prediabetes: Secondary | ICD-10-CM

## 2022-12-21 ENCOUNTER — Encounter: Payer: Medicare Other | Admitting: *Deleted

## 2022-12-21 DIAGNOSIS — I213 ST elevation (STEMI) myocardial infarction of unspecified site: Secondary | ICD-10-CM | POA: Diagnosis not present

## 2022-12-21 DIAGNOSIS — Z955 Presence of coronary angioplasty implant and graft: Secondary | ICD-10-CM | POA: Diagnosis not present

## 2022-12-21 NOTE — Progress Notes (Signed)
Daily Session Note  Patient Details  Name: IRMA DELANCEY MRN: [id number] Date of Birth: [birth year] Referring Provider:   Flowsheet Row Cardiac Rehab from 12/01/2022 in [facility] Cardiac and Pulmonary Rehab  Referring Provider Dr. Lorine Bears, MD       Encounter Date: 12/21/2022  Check In:  Session Check In - 12/21/22 1128       Check-In   Supervising physician immediately available to respond to emergencies See telemetry face sheet for immediately available ER MD    Location [facility]-Cardiac & Pulmonary Rehab    Staff Present Tommye Standard, BS, ACSM CEP, Exercise Physiologist;Normal Recinos Katrinka Blazing, RN, ADN;Meredith Jewel Baize, RN BSN;Kristen Coble, RN,BC,MSN    Virtual Visit No    Medication changes reported     No    Fall or balance concerns reported    No    Warm-up and Cool-down Performed on first and last piece of equipment    Resistance Training Performed Yes    VAD Patient? No    PAD/SET Patient? No      Pain Assessment   Currently in Pain? No/denies                Social History   Tobacco Use  Smoking Status Never  Smokeless Tobacco Never    Goals Met:  Independence with exercise equipment Exercise tolerated well No report of concerns or symptoms today Strength training completed today  Goals Unmet:  Not Applicable  Comments: Pt able to follow exercise prescription today without complaint.  Will continue to monitor for progression.    Dr. Bethann Punches is Medical Director for [facility] Cardiac Rehabilitation.  Dr. Vida Rigger is Medical Director for [facility] Pulmonary Rehabilitation.

## 2022-12-22 ENCOUNTER — Other Ambulatory Visit (INDEPENDENT_AMBULATORY_CARE_PROVIDER_SITE_OTHER): Payer: Medicare Other

## 2022-12-22 DIAGNOSIS — R7303 Prediabetes: Secondary | ICD-10-CM | POA: Diagnosis not present

## 2022-12-22 DIAGNOSIS — E785 Hyperlipidemia, unspecified: Secondary | ICD-10-CM

## 2022-12-22 DIAGNOSIS — C7A1 Malignant poorly differentiated neuroendocrine tumors: Secondary | ICD-10-CM

## 2022-12-22 DIAGNOSIS — D696 Thrombocytopenia, unspecified: Secondary | ICD-10-CM

## 2022-12-22 LAB — COMPREHENSIVE METABOLIC PANEL
ALT: 21 U/L (ref 0–53)
AST: 27 U/L (ref 0–37)
Albumin: 4.1 g/dL (ref 3.5–5.2)
Alkaline Phosphatase: 82 U/L (ref 39–117)
BUN: 24 mg/dL — ABNORMAL HIGH (ref 6–23)
CO2: 27 mEq/L (ref 19–32)
Calcium: 9.4 mg/dL (ref 8.4–10.5)
Chloride: 106 mEq/L (ref 96–112)
Creatinine, Ser: 0.89 mg/dL (ref 0.40–1.50)
GFR: 82.79 mL/min (ref >60.00)
Glucose, Bld: 85 mg/dL (ref 70–99)
Potassium: 4 mEq/L (ref 3.5–5.1)
Sodium: 140 mEq/L (ref 135–145)
Total Bilirubin: 0.6 mg/dL (ref 0.2–1.2)
Total Protein: 6.9 g/dL (ref 6.0–8.3)

## 2022-12-22 LAB — CBC WITH DIFFERENTIAL/PLATELET
Basophils Absolute: 0 10*3/uL (ref 0.0–0.1)
Basophils Relative: 0.5 % (ref 0.0–3.0)
Eosinophils Absolute: 0 10*3/uL (ref 0.0–0.7)
Eosinophils Relative: 0.4 % (ref 0.0–5.0)
HCT: 36.3 % — ABNORMAL LOW (ref 39.0–52.0)
Hemoglobin: 11.7 g/dL — ABNORMAL LOW (ref 13.0–17.0)
Lymphocytes Relative: 24.2 % (ref 12.0–46.0)
Lymphs Abs: 1.4 10*3/uL (ref 0.7–4.0)
MCHC: 32.3 g/dL (ref 30.0–36.0)
MCV: 105.2 fl — ABNORMAL HIGH (ref 78.0–100.0)
Monocytes Absolute: 0.5 10*3/uL (ref 0.1–1.0)
Monocytes Relative: 9.1 % (ref 3.0–12.0)
Neutro Abs: 3.9 10*3/uL (ref 1.4–7.7)
Neutrophils Relative %: 65.8 % (ref 43.0–77.0)
Platelets: 138 10*3/uL — ABNORMAL LOW (ref 150.0–400.0)
RBC: 3.45 Mil/uL — ABNORMAL LOW (ref 4.22–5.81)
RDW: 15 % (ref 11.5–15.5)
WBC: 5.9 10*3/uL (ref 4.0–10.5)

## 2022-12-22 LAB — LIPID PANEL
Cholesterol: 139 mg/dL (ref 0–200)
HDL: 62.5 mg/dL (ref >39.00)
LDL Cholesterol: 64 mg/dL (ref 0–99)
NonHDL: 76.91
Total CHOL/HDL Ratio: 2
Triglycerides: 63 mg/dL (ref 0.0–149.0)
VLDL: 12.6 mg/dL (ref 0.0–40.0)

## 2022-12-22 LAB — HEMOGLOBIN A1C: Hgb A1c MFr Bld: 6.3 % (ref 4.6–6.5)

## 2022-12-23 ENCOUNTER — Encounter: Payer: Medicare Other | Admitting: *Deleted

## 2022-12-23 DIAGNOSIS — Z955 Presence of coronary angioplasty implant and graft: Secondary | ICD-10-CM | POA: Diagnosis not present

## 2022-12-23 DIAGNOSIS — I213 ST elevation (STEMI) myocardial infarction of unspecified site: Secondary | ICD-10-CM | POA: Diagnosis not present

## 2022-12-23 NOTE — Progress Notes (Signed)
Daily Session Note  Patient Details  Name: Larry Kent MRN: [id number] Date of Birth: Feb 08, 1946 Referring Provider:   Flowsheet Row Cardiac Rehab from 12/01/2022 in [facility] Cardiac and Pulmonary Rehab  Referring Provider Dr. Lorine Bears, MD       Encounter Date: 12/23/2022  Check In:  Session Check In - 12/23/22 1110       Check-In   Supervising physician immediately available to respond to emergencies See telemetry face sheet for immediately available ER MD    Location [facility]-Cardiac & Pulmonary Rehab    Staff Present Lanny Hurst, RN, ADN;Meredith Jewel Baize, RN BSN;Kristen Coble, RN,BC,MSN;Noah Tickle, BS, Exercise Physiologist    Virtual Visit No    Medication changes reported     No    Fall or balance concerns reported    No    Warm-up and Cool-down Performed on first and last piece of equipment    Resistance Training Performed Yes    VAD Patient? No    PAD/SET Patient? No      Pain Assessment   Currently in Pain? No/denies                Social History   Tobacco Use  Smoking Status Never  Smokeless Tobacco Never    Goals Met:  Independence with exercise equipment Exercise tolerated well No report of concerns or symptoms today Strength training completed today  Goals Unmet:  Not Applicable  Comments: Pt able to follow exercise prescription today without complaint.  Will continue to monitor for progression.    Dr. Bethann Punches is Medical Director for [facility] Cardiac Rehabilitation.  Dr. Vida Rigger is Medical Director for [facility] Pulmonary Rehabilitation.

## 2022-12-25 DIAGNOSIS — I213 ST elevation (STEMI) myocardial infarction of unspecified site: Secondary | ICD-10-CM

## 2022-12-25 DIAGNOSIS — Z955 Presence of coronary angioplasty implant and graft: Secondary | ICD-10-CM | POA: Diagnosis not present

## 2022-12-25 NOTE — Progress Notes (Signed)
Daily Session Note  Patient Details  Name: Larry Kent MRN: [id number] Date of Birth: [birth year] Referring Provider:   Flowsheet Row Cardiac Rehab from 12/01/2022 in [facility] Cardiac and Pulmonary Rehab  Referring Provider Dr. Lorine Bears, MD       Encounter Date: 12/25/2022  Check In:  Session Check In - 12/25/22 1129       Check-In   Supervising physician immediately available to respond to emergencies See telemetry face sheet for immediately available ER MD    Location [facility]-Cardiac & Pulmonary Rehab    Staff Present Darcel Bayley, RN,BC,MSN;Joseph Noreene Filbert [place], [place], ACSM CEP, Exercise Physiologist    Virtual Visit No    Medication changes reported     No    Fall or balance concerns reported    No    Tobacco Cessation No Change    Warm-up and Cool-down Performed on first and last piece of equipment    Resistance Training Performed Yes    VAD Patient? No    PAD/SET Patient? No      Pain Assessment   Currently in Pain? No/denies                Social History   Tobacco Use  Smoking Status Never  Smokeless Tobacco Never    Goals Met:  Independence with exercise equipment Exercise tolerated well No report of concerns or symptoms today Strength training completed today  Goals Unmet:  Not Applicable  Comments: Pt able to follow exercise prescription today without complaint.  Will continue to monitor for progression.    Dr. Bethann Punches is Medical Director for [facility] Cardiac Rehabilitation.  Dr. Vida Rigger is Medical Director for [facility] Pulmonary Rehabilitation.

## 2022-12-28 ENCOUNTER — Encounter: Payer: Medicare Other | Admitting: *Deleted

## 2022-12-28 NOTE — Progress Notes (Signed)
Study was considered low risk, LV perfusion was normal with an EF of 55%, previous pulmonary nodules noted that are unchanged.

## 2022-12-29 ENCOUNTER — Encounter: Payer: Medicare Other | Admitting: Family Medicine

## 2022-12-29 ENCOUNTER — Other Ambulatory Visit: Payer: Self-pay | Admitting: Family Medicine

## 2022-12-29 DIAGNOSIS — I679 Cerebrovascular disease, unspecified: Secondary | ICD-10-CM

## 2022-12-30 ENCOUNTER — Ambulatory Visit: Payer: Medicare Other | Admitting: Family Medicine

## 2022-12-30 DIAGNOSIS — C778 Secondary and unspecified malignant neoplasm of lymph nodes of multiple regions: Secondary | ICD-10-CM | POA: Diagnosis not present

## 2023-01-04 ENCOUNTER — Encounter: Payer: Medicare Other | Attending: Cardiovascular Disease | Admitting: *Deleted

## 2023-01-04 DIAGNOSIS — Z955 Presence of coronary angioplasty implant and graft: Secondary | ICD-10-CM | POA: Insufficient documentation

## 2023-01-04 DIAGNOSIS — I213 ST elevation (STEMI) myocardial infarction of unspecified site: Secondary | ICD-10-CM | POA: Diagnosis not present

## 2023-01-04 NOTE — Progress Notes (Signed)
Daily Session Note  Patient Details  Name: Larry Kent MRN: [id number] Date of Birth: 1945-08-13 Referring Provider:   Flowsheet Row Cardiac Rehab from 12/01/2022 in [facility] Cardiac and Pulmonary Rehab  Referring Provider Dr. Lorine Bears, MD       Encounter Date: 01/04/2023  Check In:  Session Check In - 01/04/23 1154       Check-In   Supervising physician immediately available to respond to emergencies See telemetry face sheet for immediately available ER MD    Location [facility]-Cardiac & Pulmonary Rehab    Staff Present Cora Collum, RN, BSN, CCRP;Meredith Jewel Baize, RN Mabeline Caras, BS, ACSM CEP, Exercise Physiologist;Krista Karleen [place] RN, BSN    Virtual Visit No    Medication changes reported     No    Fall or balance concerns reported    No    Warm-up and Cool-down Performed on first and last piece of equipment    Resistance Training Performed Yes    VAD Patient? No    PAD/SET Patient? No      Pain Assessment   Currently in Pain? No/denies                Social History   Tobacco Use  Smoking Status Never  Smokeless Tobacco Never    Goals Met:  Independence with exercise equipment Exercise tolerated well No report of concerns or symptoms today  Goals Unmet:  Not Applicable  Comments: Pt able to follow exercise prescription today without complaint.  Will continue to monitor for progression.    Dr. Bethann Punches is Medical Director for [facility] Cardiac Rehabilitation.  Dr. Vida Rigger is Medical Director for [facility] Pulmonary Rehabilitation.

## 2023-01-05 ENCOUNTER — Encounter: Payer: Self-pay | Admitting: *Deleted

## 2023-01-05 DIAGNOSIS — I213 ST elevation (STEMI) myocardial infarction of unspecified site: Secondary | ICD-10-CM

## 2023-01-05 DIAGNOSIS — Z955 Presence of coronary angioplasty implant and graft: Secondary | ICD-10-CM

## 2023-01-05 NOTE — Progress Notes (Signed)
Cardiac Individual Treatment Plan  Patient Details  Name: Larry Kent MRN: [id number] Date of Birth: 1945/09/02 Referring Provider:   Flowsheet Row Cardiac Rehab from 12/01/2022 in [facility] Cardiac and Pulmonary Rehab  Referring Provider Dr. Lorine Bears, MD       Initial Encounter Date:  Flowsheet Row Cardiac Rehab from 12/01/2022 in [facility] Cardiac and Pulmonary Rehab  Date 12/01/22       Visit Diagnosis: ST elevation myocardial infarction (STEMI), unspecified artery [facility])  Status post coronary artery stent placement  Patient's Home Medications on Admission:  Current Outpatient Medications:    abiraterone acetate (ZYTIGA) 250 MG tablet, Take 1,000 mg by mouth daily., Disp: , Rfl:    acetaminophen (TYLENOL) 500 MG tablet, Take 1,000 mg by mouth every 6 (six) hours as needed for mild pain or headache., Disp: , Rfl:    Calcium Carb-Cholecalciferol (RA CALCIUM PLUS VITAMIN D) 600-10 MG-MCG TABS, Take 600 mg by mouth daily., Disp: , Rfl:    fluticasone (FLONASE) 50 MCG/ACT nasal spray, Place 1 spray into both nostrils daily as needed for allergies or rhinitis., Disp: , Rfl:    lidocaine-prilocaine (EMLA) cream, Apply to the Port-A-Cath site 30-60 minutes before chemotherapy treatment (Patient taking differently: Apply 1 Application topically See admin instructions. Apply to the Port-A-Cath site 30-60 minutes before chemotherapy treatment), Disp: 30 g, Rfl: 0   metoprolol tartrate (LOPRESSOR) 25 MG tablet, Take 0.5 tablets (12.5 mg total) by mouth 2 (two) times daily., Disp: 30 tablet, Rfl: 11   Multiple Vitamin (MULTIVITAMIN) tablet, Take 1 tablet by mouth daily with breakfast., Disp: , Rfl:    predniSONE (DELTASONE) 5 MG tablet, Take 5 mg by mouth daily., Disp: , Rfl:    prochlorperazine (COMPAZINE) 10 MG tablet, Take 1 tablet (10 mg total) by mouth every 6 (six) hours as needed for nausea or vomiting., Disp: 30 tablet, Rfl: 0   rosuvastatin (CRESTOR) 20 MG tablet, TAKE 1 TABLET BY MOUTH  EVERY DAY, Disp: 90 tablet, Rfl: 0   SYSTANE ULTRA PF 0.4-0.3 % SOLN, Place 1 drop into both eyes 3 (three) times daily as needed (for dryness/irritation)., Disp: 10 each, Rfl: 1   tamsulosin (FLOMAX) 0.4 MG CAPS capsule, Take 0.4 mg by mouth 2 (two) times daily., Disp: , Rfl:    ticagrelor (BRILINTA) 90 MG TABS tablet, Take 1 tablet (90 mg total) by mouth 2 (two) times daily., Disp: 60 tablet, Rfl: 11   Zinc 25 MG TABS, Take 25 mg by mouth daily., Disp: , Rfl:   Past Medical History: Past Medical History:  Diagnosis Date   Allergy    Arthritis    both knees    BCC (basal cell carcinoma), arm, right 09/2019   MOHS ([facility])   BCC (basal cell carcinoma), face 2014   L preauricular s/p MOHS   BCC (basal cell carcinoma), face 02/2018   L upper lip   BPH (benign prostatic hypertrophy)    Dr. Mena Goes @ [facility]   Carotid stenosis 05/2015   RICA 40-59%, LICA 1-39%, L vertebral occlusion, rpt 1 yr   Cataract    removed both eyes    FHx: colon cancer    FHx: prostate cancer    Heart murmur    mild aortic stenosis    History of kidney stones    Hyperlipidemia    borderline- on rosuvastatin now normal    Occlusion of right vertebral artery 05/2015    Tobacco Use: Social History   Tobacco Use  Smoking Status Never  Smokeless Tobacco Never    Labs: Review Flowsheet  More data exists      Latest Ref Rng & Units 09/23/2021 07/28/2022 08/21/2022 09/07/2022 12/22/2022  Labs for ITP Cardiac and Pulmonary Rehab  Cholestrol 0 - 200 mg/dL 562  - - 130  865   LDL (calc) 0 - 99 mg/dL 61  - - 64  64   HDL-C >39.00 mg/dL 78.46  - - 62  96.29   Trlycerides 0.0 - 149.0 mg/dL 52.8  - - 55  41.3   Hemoglobin A1c 4.6 - 6.5 % 6.1  - - 6.8  6.3   TCO2 22 - 32 mmol/L - 25  22  21   -     Exercise Target Goals: Exercise Program Goal: Individual exercise prescription set using results from initial 6 min walk test and THRR while considering  patient's activity barriers and safety.   Exercise  Prescription Goal: Initial exercise prescription builds to 30-45 minutes a day of aerobic activity, 2-3 days per week.  Home exercise guidelines will be given to patient during program as part of exercise prescription that the participant will acknowledge.   Education: Aerobic Exercise: - Group verbal and visual presentation on the components of exercise prescription. Introduces F.I.T.T principle from ACSM for exercise prescriptions.  Reviews F.I.T.T. principles of aerobic exercise including progression. Written material given at graduation. Flowsheet Row Cardiac Rehab from 12/01/2022 in [facility] Cardiac and Pulmonary Rehab  Education need identified 12/01/22       Education: Resistance Exercise: - Group verbal and visual presentation on the components of exercise prescription. Introduces F.I.T.T principle from ACSM for exercise prescriptions  Reviews F.I.T.T. principles of resistance exercise including progression. Written material given at graduation.    Education: Exercise & Equipment Safety: - Individual verbal instruction and demonstration of equipment use and safety with use of the equipment. Flowsheet Row Cardiac Rehab from 12/01/2022 in [facility] Cardiac and Pulmonary Rehab  Date 12/01/22  Educator NT  Instruction Review Code 1- Verbalizes Understanding       Education: Exercise Physiology & General Exercise Guidelines: - Group verbal and written instruction with models to review the exercise physiology of the cardiovascular system and associated critical values. Provides general exercise guidelines with specific guidelines to those with heart or lung disease.    Education: Flexibility, Balance, Mind/Body Relaxation: - Group verbal and visual presentation with interactive activity on the components of exercise prescription. Introduces F.I.T.T principle from ACSM for exercise prescriptions. Reviews F.I.T.T. principles of flexibility and balance exercise training including progression. Also  discusses the mind body connection.  Reviews various relaxation techniques to help reduce and manage stress (i.e. Deep breathing, progressive muscle relaxation, and visualization). Balance handout provided to take home. Written material given at graduation.   Activity Barriers & Risk Stratification:  Activity Barriers & Cardiac Risk Stratification - 12/01/22 1451       Activity Barriers & Cardiac Risk Stratification   Activity Barriers Other (comment);Back Problems;Arthritis    Comments R knee pain    Cardiac Risk Stratification Moderate             6 Minute Walk:  6 Minute Walk     Row Name 12/01/22 1450         6 Minute Walk   Phase Initial     Distance 1295 feet     Walk Time 6 minutes     # of Rest Breaks 0     MPH 2.45     METS 2.5  RPE 7     Perceived Dyspnea  0     VO2 Peak 8.74     Symptoms No     Resting HR 60 bpm     Resting BP 102/50     Resting Oxygen Saturation  97 %     Exercise Oxygen Saturation  during 6 min walk 99 %     Max Ex. HR 91 bpm     Max Ex. BP 144/66     2 Minute Post BP 122/62              Oxygen Initial Assessment:   Oxygen Re-Evaluation:   Oxygen Discharge (Final Oxygen Re-Evaluation):   Initial Exercise Prescription:  Initial Exercise Prescription - 12/01/22 1400       Date of Initial Exercise RX and Referring Provider   Date 12/01/22    Referring Provider Dr. Lorine Bears, MD      Oxygen   Maintain Oxygen Saturation 88% or higher      Treadmill   MPH 2    Grade 0    Minutes 15    METs 2.53      NuStep   Level 2    SPM 80    Minutes 15    METs 2.5      REL-XR   Level 2    Speed 50    Minutes 15    METs 2.5      T5 Nustep   Level 1    SPM 80    Minutes 15    METs 2.5      Prescription Details   Frequency (times per week) 3    Duration Progress to 30 minutes of continuous aerobic without signs/symptoms of physical distress      Intensity   THRR 40-80% of Max Heartrate 93-126     Ratings of Perceived Exertion 11-13    Perceived Dyspnea 0-4      Progression   Progression Continue to progress workloads to maintain intensity without signs/symptoms of physical distress.      Resistance Training   Training Prescription Yes    Weight 4 lb    Reps 10-15             Perform Capillary Blood Glucose checks as needed.  Exercise Prescription Changes:   Exercise Prescription Changes     Row Name 12/01/22 1400 12/07/22 1600 12/23/22 1400         Response to Exercise   Blood Pressure (Admit) 102/50 108/52 118/62     Blood Pressure (Exercise) 144/66 122/58 128/60     Blood Pressure (Exit) 122/62 110/58 138/64     Heart Rate (Admit) 60 bpm 64 bpm 61 bpm     Heart Rate (Exercise) 91 bpm 94 bpm 97 bpm     Heart Rate (Exit) 59 bpm 76 bpm 69 bpm     Oxygen Saturation (Admit) 97 % -- --     Oxygen Saturation (Exercise) 99 % -- --     Rating of Perceived Exertion (Exercise) 7 12 13      Perceived Dyspnea (Exercise) 0 -- --     Symptoms None none none     Comments Results first full day of exercise --     Duration -- Progress to 30 minutes of  aerobic without signs/symptoms of physical distress Continue with 30 min of aerobic exercise without signs/symptoms of physical distress.     Intensity -- THRR unchanged THRR unchanged  Progression   Progression -- Continue to progress workloads to maintain intensity without signs/symptoms of physical distress. Continue to progress workloads to maintain intensity without signs/symptoms of physical distress.     Average METs -- 2.46 2.63       Resistance Training   Training Prescription -- Yes Yes     Weight -- 4 lb 4 lb     Reps -- 10-15 10-15       Interval Training   Interval Training -- No No       Treadmill   MPH -- 2 2     Grade -- 0 0     Minutes -- 15 15     METs -- 2.53 2.53       NuStep   Level -- 2 3     Minutes -- 15 15     METs -- 2.4 2.53       REL-XR   Level -- -- 2     Minutes -- -- 15      METs -- -- 2.5       Oxygen   Maintain Oxygen Saturation -- 88% or higher 88% or higher              Exercise Comments:   Exercise Comments     Row Name 12/07/22 1145           Exercise Comments First full day of exercise!  Patient was oriented to gym and equipment including functions, settings, policies, and procedures.  Patient's individual exercise prescription and treatment plan were reviewed.  All starting workloads were established based on the results of the 6 minute walk test done at initial orientation visit.  The plan for exercise progression was also introduced and progression will be customized based on patient's performance and goals.                Exercise Goals and Review:   Exercise Goals     Row Name 12/01/22 1309             Exercise Goals   Increase Physical Activity Yes       Intervention Develop an individualized exercise prescription for aerobic and resistive training based on initial evaluation findings, risk stratification, comorbidities and participant's personal goals.;Provide advice, education, support and counseling about physical activity/exercise needs.       Expected Outcomes Long Term: Exercising regularly at least 3-5 days a week.;Long Term: Add in home exercise to make exercise part of routine and to increase amount of physical activity.;Short Term: Attend rehab on a regular basis to increase amount of physical activity.       Increase Strength and Stamina Yes       Intervention Develop an individualized exercise prescription for aerobic and resistive training based on initial evaluation findings, risk stratification, comorbidities and participant's personal goals.;Provide advice, education, support and counseling about physical activity/exercise needs.       Expected Outcomes Short Term: Increase workloads from initial exercise prescription for resistance, speed, and METs.;Short Term: Perform resistance training exercises routinely  during rehab and add in resistance training at home;Long Term: Improve cardiorespiratory fitness, muscular endurance and strength as measured by increased METs and functional capacity ( )       Able to understand and use rate of perceived exertion (RPE) scale Yes       Intervention Provide education and explanation on how to use RPE scale       Expected Outcomes Short Term: Able to use RPE  daily in rehab to express subjective intensity level;Long Term:  Able to use RPE to guide intensity level when exercising independently       Able to understand and use Dyspnea scale Yes       Intervention Provide education and explanation on how to use Dyspnea scale       Expected Outcomes Short Term: Able to use Dyspnea scale daily in rehab to express subjective sense of shortness of breath during exertion;Long Term: Able to use Dyspnea scale to guide intensity level when exercising independently       Knowledge and understanding of Target Heart Rate Range (THRR) Yes       Intervention Provide education and explanation of THRR including how the numbers were predicted and where they are located for reference       Expected Outcomes Long Term: Able to use THRR to govern intensity when exercising independently;Short Term: Able to state/look up THRR;Short Term: Able to use daily as guideline for intensity in rehab       Able to check pulse independently Yes       Intervention Provide education and demonstration on how to check pulse in carotid and radial arteries.;Review the importance of being able to check your own pulse for safety during independent exercise       Expected Outcomes Short Term: Able to explain why pulse checking is important during independent exercise;Long Term: Able to check pulse independently and accurately       Understanding of Exercise Prescription Yes       Intervention Provide education, explanation, and written materials on patient's individual exercise prescription       Expected  Outcomes Short Term: Able to explain program exercise prescription;Long Term: Able to explain home exercise prescription to exercise independently                Exercise Goals Re-Evaluation :  Exercise Goals Re-Evaluation     Row Name 12/07/22 1146 12/23/22 1442           Exercise Goal Re-Evaluation   Exercise Goals Review Increase Physical Activity;Increase Strength and Stamina;Able to understand and use rate of perceived exertion (RPE) scale;Knowledge and understanding of Target Heart Rate Range (THRR);Able to check pulse independently;Understanding of Exercise Prescription Increase Physical Activity;Increase Strength and Stamina;Understanding of Exercise Prescription      Comments Reviewed RPE  and dyspnea scale, THR and program prescription with pt today.  Pt voiced understanding and was given a copy of goals to take home. Larry Kent is new to the program. He has increased his NUstep T4 from level 2 to level 3. He maintained the TM at 2 MPH 0%elevation and maintianed the REXR at level 2. He continues to use 4 lb hand weights.  Will continue to monitor his progress.      Expected Outcomes Short: Use RPE daily to regulate intensity.  Long: Follow program prescription in THR. STG COntinue to see exercise progression. LTG He has increased stamina and strength from his exercise progression               Discharge Exercise Prescription (Final Exercise Prescription Changes):  Exercise Prescription Changes - 12/23/22 1400       Response to Exercise   Blood Pressure (Admit) 118/62    Blood Pressure (Exercise) 128/60    Blood Pressure (Exit) 138/64    Heart Rate (Admit) 61 bpm    Heart Rate (Exercise) 97 bpm    Heart Rate (Exit) 69 bpm    Rating  of Perceived Exertion (Exercise) 13    Symptoms none    Duration Continue with 30 min of aerobic exercise without signs/symptoms of physical distress.    Intensity THRR unchanged      Progression   Progression Continue to progress workloads  to maintain intensity without signs/symptoms of physical distress.    Average METs 2.63      Resistance Training   Training Prescription Yes    Weight 4 lb    Reps 10-15      Interval Training   Interval Training No      Treadmill   MPH 2    Grade 0    Minutes 15    METs 2.53      NuStep   Level 3    Minutes 15    METs 2.53      REL-XR   Level 2    Minutes 15    METs 2.5      Oxygen   Maintain Oxygen Saturation 88% or higher             Nutrition:  Target Goals: Understanding of nutrition guidelines, daily intake of sodium 1500mg , cholesterol 200mg , calories 30% from fat and 7% or less from saturated fats, daily to have 5 or more servings of fruits and vegetables.  Education: All About Nutrition: -Group instruction provided by verbal, written material, interactive activities, discussions, models, and posters to present general guidelines for heart healthy nutrition including fat, fiber, MyPlate, the role of sodium in heart healthy nutrition, utilization of the nutrition label, and utilization of this knowledge for meal planning. Follow up email sent as well. Written material given at graduation. Flowsheet Row Cardiac Rehab from 12/01/2022 in [facility] Cardiac and Pulmonary Rehab  Education need identified 12/01/22       Biometrics:  Pre Biometrics - 12/01/22 1455       Pre Biometrics   Height 5' 7.5" (1.715 m)    Weight 180 lb 3.2 oz (81.7 kg)    Waist Circumference 37.5 inches    Hip Circumference 39.5 inches    Waist to Hip Ratio 0.95 %    BMI (Calculated) 27.79    Single Leg Stand 12.7 seconds              Nutrition Therapy Plan and Nutrition Goals:  Nutrition Therapy & Goals - 12/01/22 1426       Intervention Plan   Intervention Prescribe, educate and counsel regarding individualized specific dietary modifications aiming towards targeted core components such as weight, hypertension, lipid management, diabetes, heart failure and other  comorbidities.    Expected Outcomes Short Term Goal: Understand basic principles of dietary content, such as calories, fat, sodium, cholesterol and nutrients.;Short Term Goal: A plan has been developed with personal nutrition goals set during dietitian appointment.;Long Term Goal: Adherence to prescribed nutrition plan.             Nutrition Assessments:  MEDIFICTS Score Key: ?70 Need to make dietary changes  40-70 Heart Healthy Diet ? 40 Therapeutic Level Cholesterol Diet  Flowsheet Row Cardiac Rehab from 12/01/2022 in [facility] Cardiac and Pulmonary Rehab  Picture Your Plate Total Score on Admission 63      Picture Your Plate Scores: <16 Unhealthy dietary pattern with much room for improvement. 41-50 Dietary pattern unlikely to meet recommendations for good health and room for improvement. 51-60 More healthful dietary pattern, with some room for improvement.  >60 Healthy dietary pattern, although there may be some specific behaviors that could be  improved.    Nutrition Goals Re-Evaluation:   Nutrition Goals Discharge (Final Nutrition Goals Re-Evaluation):   Psychosocial: Target Goals: Acknowledge presence or absence of significant depression and/or stress, maximize coping skills, provide positive support system. Participant is able to verbalize types and ability to use techniques and skills needed for reducing stress and depression.   Education: Stress, Anxiety, and Depression - Group verbal and visual presentation to define topics covered.  Reviews how body is impacted by stress, anxiety, and depression.  Also discusses healthy ways to reduce stress and to treat/manage anxiety and depression.  Written material given at graduation.   Education: Sleep Hygiene -Provides group verbal and written instruction about how sleep can affect your health.  Define sleep hygiene, discuss sleep cycles and impact of sleep habits. Review good sleep hygiene tips.    Initial Review &  Psychosocial Screening:  Initial Psych Review & Screening - 11/25/22 1010       Initial Review   Current issues with None Identified      Family Dynamics   Good Support System? Yes   adult son/daughter and wife     Barriers   Psychosocial barriers to participate in program There are no identifiable barriers or psychosocial needs.      Screening Interventions   Interventions Encouraged to exercise;To provide support and resources with identified psychosocial needs;Provide feedback about the scores to participant    Expected Outcomes Short Term goal: Utilizing psychosocial counselor, staff and physician to assist with identification of specific Stressors or current issues interfering with healing process. Setting desired goal for each stressor or current issue identified.;Long Term Goal: Stressors or current issues are controlled or eliminated.;Short Term goal: Identification and review with participant of any Quality of Life or Depression concerns found by scoring the questionnaire.;Long Term goal: The participant improves quality of Life and PHQ9 Scores as seen by post scores and/or verbalization of changes             Quality of Life Scores:   Quality of Life - 12/01/22 1438       Quality of Life   Select Quality of Life      Quality of Life Scores   Health/Function Pre 16.64 %    Socioeconomic Pre 21.13 %    Psych/Spiritual Pre 15.29 %    Family Pre 24 %    GLOBAL Pre 18.15 %            Scores of 19 and below usually indicate a poorer quality of life in these areas.  A difference of  2-3 points is a clinically meaningful difference.  A difference of 2-3 points in the total score of the Quality of Life Index has been associated with significant improvement in overall quality of life, self-image, physical symptoms, and general health in studies assessing change in quality of life.  PHQ-9: Review Flowsheet  More data exists      12/01/2022 10/06/2022 09/28/2022 09/30/2021  09/16/2020  Depression screen PHQ 2/9  Decreased Interest 0 0 0 0 0  Down, Depressed, Hopeless 0 0 0 0 0  PHQ - 2 Score 0 0 0 0 0  Altered sleeping 0 - - - -  Tired, decreased energy 1 - - - -  Change in appetite 0 - - - -  Feeling bad or failure about yourself  0 - - - -  Trouble concentrating 1 - - - -  Moving slowly or fidgety/restless 0 - - - -  Suicidal thoughts 0 - - - -  PHQ-9 Score 2 - - - -  Difficult doing work/chores Not difficult at all - - - -   Interpretation of Total Score  Total Score Depression Severity:  1-4 = Minimal depression, 5-9 = Mild depression, 10-14 = Moderate depression, 15-19 = Moderately severe depression, 20-27 = Severe depression   Psychosocial Evaluation and Intervention:  Psychosocial Evaluation - 11/25/22 1031       Psychosocial Evaluation & Interventions   Interventions Encouraged to exercise with the program and follow exercise prescription    Comments Fabion has no barriers to attending the program. He lives with his wife and adult son. THey are his support along with his daughter.  He is undergoing chemo and is receiving a monthly dose. He continues to work Radio producer jobs. He wants to get back in shape he was in prior to his heart attack.  He is ready to get started    Expected Outcomes STG Attends all scheduled sessions, Progresses with his exercise,following the guidelines.  LTG He continues with his exercise progression and following health guidelines    Continue Psychosocial Services  Follow up required by staff             Psychosocial Re-Evaluation:   Psychosocial Discharge (Final Psychosocial Re-Evaluation):   Vocational Rehabilitation: Provide vocational rehab assistance to qualifying candidates.   Vocational Rehab Evaluation & Intervention:  Vocational Rehab - 11/25/22 1013       Initial Vocational Rehab Evaluation & Intervention   Assessment shows need for Vocational Rehabilitation No      Vocational Rehab  Re-Evaulation   Comments working part time  was self employed             Education: Education Goals: Education classes will be provided on a variety of topics geared toward better understanding of heart health and risk factor modification. Participant will state understanding/return demonstration of topics presented as noted by education test scores.  Learning Barriers/Preferences:   General Cardiac Education Topics:  AED/CPR: - Group verbal and written instruction with the use of models to demonstrate the basic use of the AED with the basic ABC's of resuscitation.   Anatomy and Cardiac Procedures: - Group verbal and visual presentation and models provide information about basic cardiac anatomy and function. Reviews the testing methods done to diagnose heart disease and the outcomes of the test results. Describes the treatment choices: Medical Management, Angioplasty, or Coronary Bypass Surgery for treating various heart conditions including Myocardial Infarction, Angina, Valve Disease, and Cardiac Arrhythmias.  Written material given at graduation.   Medication Safety: - Group verbal and visual instruction to review commonly prescribed medications for heart and lung disease. Reviews the medication, class of the drug, and side effects. Includes the steps to properly store meds and maintain the prescription regimen.  Written material given at graduation.   Intimacy: - Group verbal instruction through game format to discuss how heart and lung disease can affect sexual intimacy. Written material given at graduation..   Know Your Numbers and Heart Failure: - Group verbal and visual instruction to discuss disease risk factors for cardiac and pulmonary disease and treatment options.  Reviews associated critical values for Overweight/Obesity, Hypertension, Cholesterol, and Diabetes.  Discusses basics of heart failure: signs/symptoms and treatments.  Introduces Heart Failure Zone chart for  action plan for heart failure.  Written material given at graduation.   Infection Prevention: - Provides verbal and written material to individual with discussion of infection control including proper hand washing and proper equipment cleaning  during exercise session. Flowsheet Row Cardiac Rehab from 12/01/2022 in [facility] Cardiac and Pulmonary Rehab  Date 12/01/22  Educator NT  Instruction Review Code 1- Verbalizes Understanding       Falls Prevention: - Provides verbal and written material to individual with discussion of falls prevention and safety. Flowsheet Row Cardiac Rehab from 12/01/2022 in [facility] Cardiac and Pulmonary Rehab  Date 11/25/22  Educator SB  Instruction Review Code 1- Verbalizes Understanding       Other: -Provides group and verbal instruction on various topics (see comments)   Knowledge Questionnaire Score:  Knowledge Questionnaire Score - 12/01/22 1425       Knowledge Questionnaire Score   Pre Score 24/26             Core Components/Risk Factors/Patient Goals at Admission:  Personal Goals and Risk Factors at Admission - 11/25/22 1012       Core Components/Risk Factors/Patient Goals on Admission    Weight Management Yes    Intervention Weight Management: Provide education and appropriate resources to help participant work on and attain dietary goals.;Weight Management: Develop a combined nutrition and exercise program designed to reach desired caloric intake, while maintaining appropriate intake of nutrient and fiber, sodium and fats, and appropriate energy expenditure required for the weight goal.    Admit Weight 175 lb (79.4 kg)    Goal Weight: Long Term 175 lb (79.4 kg)    Expected Outcomes Short Term: Continue to assess and modify interventions until short term weight is achieved;Long Term: Adherence to nutrition and physical activity/exercise program aimed toward attainment of established weight goal;Weight Maintenance: Understanding of the daily  nutrition guidelines, which includes 25-35% calories from fat, 7% or less cal from saturated fats, less than 200mg  cholesterol, less than 1.5gm of sodium, & 5 or more servings of fruits and vegetables daily    Lipids Yes    Intervention Provide education and support for participant on nutrition & aerobic/resistive exercise along with prescribed medications to achieve LDL 70mg , HDL >40mg .    Expected Outcomes Short Term: Participant states understanding of desired cholesterol values and is compliant with medications prescribed. Participant is following exercise prescription and nutrition guidelines.;Long Term: Cholesterol controlled with medications as prescribed, with individualized exercise RX and with personalized nutrition plan. Value goals: LDL < 70mg , HDL > 40 mg.             Education:Diabetes - Individual verbal and written instruction to review signs/symptoms of diabetes, desired ranges of glucose level fasting, after meals and with exercise. Acknowledge that pre and post exercise glucose checks will be done for 3 sessions at entry of program.   Core Components/Risk Factors/Patient Goals Review:    Core Components/Risk Factors/Patient Goals at Discharge (Final Review):    ITP Comments:  ITP Comments     Row Name 11/25/22 1028 12/01/22 1309 12/07/22 1144 12/09/22 0731 01/05/23 1359   ITP Comments Virtual orientation call completed today. he has an appointment on Date: 12/01/2022  for EP eval and gym Orientation.  Documentation of diagnosis can be found in [facility] Date: 09/25/2022 . Completed and gym orientation. Initial ITP created and sent for review to Dr. Bethann Punches, Medical Director. First full day of exercise!  Patient was oriented to gym and equipment including functions, settings, policies, and procedures.  Patient's individual exercise prescription and treatment plan were reviewed.  All starting workloads were established based on the results of the 6 minute walk test done at  initial orientation visit.  The plan for  exercise progression was also introduced and progression will be customized based on patient's performance and goals. 30 Day review completed. Medical Director ITP review done, changes made as directed, and signed approval by Medical Director.   new to program 30 Day review completed. Medical Director ITP review done, changes made as directed, and signed approval by Medical Director.            Comments:

## 2023-01-08 ENCOUNTER — Encounter: Payer: Medicare Other | Admitting: *Deleted

## 2023-01-08 DIAGNOSIS — I213 ST elevation (STEMI) myocardial infarction of unspecified site: Secondary | ICD-10-CM | POA: Diagnosis not present

## 2023-01-08 DIAGNOSIS — Z955 Presence of coronary angioplasty implant and graft: Secondary | ICD-10-CM | POA: Diagnosis not present

## 2023-01-08 NOTE — Progress Notes (Signed)
Daily Session Note  Patient Details  Name: Larry DECLERCQ MRN: [id number] Date of Birth: [birth year] Referring Provider:   Flowsheet Row Cardiac Rehab from 12/01/2022 in [facility] Cardiac and Pulmonary Rehab  Referring Provider Dr. Lorine Bears, MD       Encounter Date: 01/08/2023  Check In:  Session Check In - 01/08/23 1152       Check-In   Supervising physician immediately available to respond to emergencies See telemetry face sheet for immediately available ER MD    Location [facility]-Cardiac & Pulmonary Rehab    Staff Present Cora Collum, RN, BSN, CCRP;Kristen Coble, RN,BC,MSN;Joseph [place], [place]    Virtual Visit No    Medication changes reported     No    Fall or balance concerns reported    No    Warm-up and Cool-down Performed on first and last piece of equipment    Resistance Training Performed Yes    VAD Patient? No    PAD/SET Patient? No      Pain Assessment   Currently in Pain? No/denies                Social History   Tobacco Use  Smoking Status Never  Smokeless Tobacco Never    Goals Met:  Independence with exercise equipment Exercise tolerated well No report of concerns or symptoms today  Goals Unmet:  Not Applicable  Comments: Pt able to follow exercise prescription today without complaint.  Will continue to monitor for progression.    Dr. Bethann Punches is Medical Director for [facility] Cardiac Rehabilitation.  Dr. Vida Rigger is Medical Director for [facility] Pulmonary Rehabilitation.

## 2023-01-11 ENCOUNTER — Encounter: Payer: Medicare Other | Admitting: *Deleted

## 2023-01-11 DIAGNOSIS — I213 ST elevation (STEMI) myocardial infarction of unspecified site: Secondary | ICD-10-CM

## 2023-01-11 DIAGNOSIS — Z955 Presence of coronary angioplasty implant and graft: Secondary | ICD-10-CM | POA: Diagnosis not present

## 2023-01-11 NOTE — Progress Notes (Signed)
Daily Session Note  Patient Details  Name: Larry Kent MRN: [id number] Date of Birth: 1945-09-18 Referring Provider:   Flowsheet Row Cardiac Rehab from 12/01/2022 in [facility] Cardiac and Pulmonary Rehab  Referring Provider Dr. Lorine Bears, MD       Encounter Date: 01/11/2023  Check In:  Session Check In - 01/11/23 1102       Check-In   Supervising physician immediately available to respond to emergencies See telemetry face sheet for immediately available ER MD    Location [facility]-Cardiac & Pulmonary Rehab    Staff Present Lanny Hurst, RN, ADN;[name], [place] Hux[place], BS, ACSM CEP, Exercise Physiologist;Meredith Jewel Baize, RN BSN    Virtual Visit No    Medication changes reported     No    Fall or balance concerns reported    No    Warm-up and Cool-down Performed on first and last piece of equipment    Resistance Training Performed Yes    VAD Patient? No    PAD/SET Patient? No      Pain Assessment   Currently in Pain? No/denies                Social History   Tobacco Use  Smoking Status Never  Smokeless Tobacco Never    Goals Met:  Independence with exercise equipment Exercise tolerated well No report of concerns or symptoms today Strength training completed today  Goals Unmet:  Not Applicable  Comments: Pt able to follow exercise prescription today without complaint.  Will continue to monitor for progression.    Dr. Bethann Punches is Medical Director for [facility] Cardiac Rehabilitation.  Dr. Vida Rigger is Medical Director for [facility] Pulmonary Rehabilitation.

## 2023-01-13 ENCOUNTER — Encounter: Payer: Medicare Other | Admitting: *Deleted

## 2023-01-13 DIAGNOSIS — I213 ST elevation (STEMI) myocardial infarction of unspecified site: Secondary | ICD-10-CM

## 2023-01-13 DIAGNOSIS — Z955 Presence of coronary angioplasty implant and graft: Secondary | ICD-10-CM | POA: Diagnosis not present

## 2023-01-13 NOTE — Progress Notes (Signed)
Daily Session Note  Patient Details  Name: Larry Kent MRN: [id number] Date of Birth: [birth year] Referring Provider:   Flowsheet Row Cardiac Rehab from 12/01/2022 in [facility] Cardiac and Pulmonary Rehab  Referring Provider Dr. Lorine Bears, MD       Encounter Date: 01/13/2023  Check In:  Session Check In - 01/13/23 1137       Check-In   Supervising physician immediately available to respond to emergencies See telemetry face sheet for immediately available ER MD    Location [facility]-Cardiac & Pulmonary Rehab    Staff Present Lanny Hurst, RN, ADN;Larry Kent, RCP,RRT,BSRT;Meredith Jewel Baize, RN BSN;Denise Rhew, PhD, RN, CNS, CEN    Virtual Visit No    Medication changes reported     No    Fall or balance concerns reported    No    Warm-up and Cool-down Performed on first and last piece of equipment    Resistance Training Performed Yes    VAD Patient? No    PAD/SET Patient? No      Pain Assessment   Currently in Pain? No/denies                Social History   Tobacco Use  Smoking Status Never  Smokeless Tobacco Never    Goals Met:  Independence with exercise equipment Exercise tolerated well No report of concerns or symptoms today Strength training completed today  Goals Unmet:  Not Applicable  Comments: Pt able to follow exercise prescription today without complaint.  Will continue to monitor for progression.    Dr. Bethann Punches is Medical Director for [facility] Cardiac Rehabilitation.  Dr. Vida Rigger is Medical Director for [facility] Pulmonary Rehabilitation.

## 2023-01-18 ENCOUNTER — Encounter: Payer: Medicare Other | Admitting: *Deleted

## 2023-01-18 DIAGNOSIS — I213 ST elevation (STEMI) myocardial infarction of unspecified site: Secondary | ICD-10-CM | POA: Diagnosis not present

## 2023-01-18 DIAGNOSIS — Z955 Presence of coronary angioplasty implant and graft: Secondary | ICD-10-CM

## 2023-01-18 NOTE — Progress Notes (Signed)
Daily Session Note  Patient Details  Name: Larry Kent MRN: [id number] Date of Birth: 1945-09-14 Referring Provider:   Flowsheet Row Cardiac Rehab from 12/01/2022 in [facility] Cardiac and Pulmonary Rehab  Referring Provider Dr. Lorine Bears, MD       Encounter Date: 01/18/2023  Check In:  Session Check In - 01/18/23 1115       Check-In   Supervising physician immediately available to respond to emergencies See telemetry face sheet for immediately available ER MD    Location [facility]-Cardiac & Pulmonary Rehab    Staff Present Larry Hurst, RN, ADN;Larry Kent, RCP,RRT,BSRT;Larry Jewel Baize, RN BSN    Virtual Visit No    Medication changes reported     No    Fall or balance concerns reported    No    Warm-up and Cool-down Performed on first and last piece of equipment    Resistance Training Performed Yes    VAD Patient? No    PAD/SET Patient? No      Pain Assessment   Currently in Pain? No/denies                Social History   Tobacco Use  Smoking Status Never  Smokeless Tobacco Never    Goals Met:  Independence with exercise equipment Exercise tolerated well No report of concerns or symptoms today Strength training completed today  Goals Unmet:  Not Applicable  Comments: Pt able to follow exercise prescription today without complaint.  Will continue to monitor for progression.    Dr. Bethann Punches is Medical Director for [facility] Cardiac Rehabilitation.  Dr. Vida Rigger is Medical Director for [facility] Pulmonary Rehabilitation.

## 2023-01-19 ENCOUNTER — Encounter: Payer: Self-pay | Admitting: Family Medicine

## 2023-01-19 ENCOUNTER — Encounter: Payer: Self-pay | Admitting: Internal Medicine

## 2023-01-19 ENCOUNTER — Ambulatory Visit (INDEPENDENT_AMBULATORY_CARE_PROVIDER_SITE_OTHER): Payer: Medicare Other | Admitting: Family Medicine

## 2023-01-19 VITALS — BP 124/70 | HR 60 | Temp 97.2°F | Ht 65.5 in | Wt 184.0 lb

## 2023-01-19 DIAGNOSIS — C61 Malignant neoplasm of prostate: Secondary | ICD-10-CM

## 2023-01-19 DIAGNOSIS — C3490 Malignant neoplasm of unspecified part of unspecified bronchus or lung: Secondary | ICD-10-CM | POA: Diagnosis not present

## 2023-01-19 DIAGNOSIS — I350 Nonrheumatic aortic (valve) stenosis: Secondary | ICD-10-CM

## 2023-01-19 DIAGNOSIS — Z7189 Other specified counseling: Secondary | ICD-10-CM

## 2023-01-19 DIAGNOSIS — I6523 Occlusion and stenosis of bilateral carotid arteries: Secondary | ICD-10-CM | POA: Diagnosis not present

## 2023-01-19 DIAGNOSIS — I679 Cerebrovascular disease, unspecified: Secondary | ICD-10-CM | POA: Diagnosis not present

## 2023-01-19 DIAGNOSIS — E785 Hyperlipidemia, unspecified: Secondary | ICD-10-CM

## 2023-01-19 DIAGNOSIS — C7A1 Malignant poorly differentiated neuroendocrine tumors: Secondary | ICD-10-CM | POA: Diagnosis not present

## 2023-01-19 DIAGNOSIS — Z0000 Encounter for general adult medical examination without abnormal findings: Secondary | ICD-10-CM | POA: Diagnosis not present

## 2023-01-19 DIAGNOSIS — R7303 Prediabetes: Secondary | ICD-10-CM

## 2023-01-19 DIAGNOSIS — I25110 Atherosclerotic heart disease of native coronary artery with unstable angina pectoris: Secondary | ICD-10-CM | POA: Diagnosis not present

## 2023-01-19 MED ORDER — ROSUVASTATIN CALCIUM 20 MG PO TABS: 20.0000 mg | ORAL_TABLET | Freq: Every day | ORAL | 4 refills | Status: DC

## 2023-01-19 NOTE — Patient Instructions (Addendum)
If interested, check with pharmacy about new 2 shot shingles series (shingrix) and RSV.  Bring [place] a copy of your living will.  Good to see you today.  Return as needed or in 1 year for next physical.

## 2023-01-19 NOTE — Assessment & Plan Note (Signed)
Preventative protocols reviewed and updated unless pt declined. Discussed healthy diet and lifestyle.  

## 2023-01-19 NOTE — Assessment & Plan Note (Addendum)
Previously discussed. Asked to bring [place] copy when complete

## 2023-01-19 NOTE — Progress Notes (Signed)
Ph: [phone] Fax: [phone]   Patient ID: Larry Kent, male    DOB: [birth year], 77 y.o.   MRN: [id number]  This visit was conducted in person.  BP 124/70   Pulse 60   Temp (!) 97.2 F (36.2 C) (Temporal)   Ht 5' 5.5" (1.664 m)   Wt 184 lb (83.5 kg)   SpO2 97%   BMI 30.15 kg/m    CC: CPE Subjective:   HPI: Larry Kent is a 77 y.o. male presenting on 01/19/2023 for Annual Exam (MCR prt 2 [AWV- 10/06/22].)   Saw health advisor 09/2022 for medicare wellness visit. Note reviewed.   No results found.  Flowsheet Row Cardiac Rehab from 12/01/2022 in [facility] Cardiac and Pulmonary Rehab  PHQ-2 Total Score 0          11/25/2022   10:07 AM 10/06/2022    9:46 AM 09/28/2022    9:03 AM 09/30/2021    2:02 PM 09/16/2020    3:27 PM  Fall Risk   Falls in the past year? 0 0 0 0 0  Number falls in past yr: 0 0 0    Injury with Fall? 0 0 0    Risk for fall due to : Medication side effect No Fall Risks No Fall Risks    Follow up Falls evaluation completed;Education provided;Falls prevention discussed Falls prevention discussed;Education provided;Falls evaluation completed Falls evaluation completed     Looking into therapy with wife who's having anxiety difficulty related to his health. Adult son lives with them.   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 Also with prostate adenocarcinoma diagnosed 07/2022 managed by urology Mena Goes) on abiraterone Roosvelt Maser) 1000mg  daily + prednisone 5mg  BID. Has received Eligard. Continued hot flashes. Continues palliative chemotherapy, currently on cycle #5.   Hospitalized 08/2022 with acute inferior wall STEMI s/p catheterization with distal occlusion of RCA s/p stent placement DES x4. Hospitalization complicated by Enterococcus faecalis urosepsis treated with IV ampicillin then oral amoxicillin. Flomax started for  urinary retention. Continues brillinta, aspirin stopped due to anemia 09/2022. Established with cardiac rehab. Recent cardiac perfusion PET scan reassuring "low risk".   Saw ID in follow up 10/2022 - blcx x2 normal.  Aortic stenosis monitored by cardiology with echo Q52yrs.  Carotid stenosis s/p R CEA 11/2019 monitoring yearly (Dr Chestine Spore [facility]).    Preventative: COLONOSCOPY Date: ~2010 medium int hemorrhoids, o/w WNL, rpt 5 yrs given fmhx (Dr. Randa Evens) - discussed. Cologuard normal 2018.  Colonoscopy 08/2019 - 5 TAs, diverticulosis, rpt 3 yrs ([facility]) - will defer at this time.  Prostate cancer, see above - sees Dr Mena Goes yearly at [facility]. H/o BPH on tamsulosin Lung cancer screening - not eligible  Flu shot yearly  COVID vaccine Moderna 07/2019, 08/2019, booster 04/2020 Td - 2013  Pneumovax - 2013, prevnar-13 2015  Shingrix - discussed, to consider RSV - discussed, to consider  Advanced directives: packet provided today. Would want wife to be HCPOA. Has at home - needs notarized and will bring [place] copy. Ok with CPR and temporary life support, would want family to to help decide if prolonged illness. Would be ok with feeding tube.  Seat belt use discussed Sunscreen use discussed. No changing moles on skin. 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 Interim medical history since our last visit reviewed. Allergies and medications reviewed and  updated. Outpatient Medications Prior to Visit  Medication Sig Dispense Refill   abiraterone acetate (ZYTIGA) 250 MG tablet Take 1,000 mg by mouth daily.     acetaminophen (TYLENOL) 500 MG tablet Take 1,000 mg by mouth every 6 (six) hours as needed for mild pain or headache.     Calcium Carb-Cholecalciferol (RA CALCIUM PLUS VITAMIN D) 600-10 MG-MCG TABS Take 600 mg by mouth daily.     fluticasone (FLONASE) 50 MCG/ACT nasal spray Place 1 spray into both nostrils daily as needed for allergies or rhinitis.     lidocaine-prilocaine (EMLA) cream Apply to the Port-A-Cath site 30-60 minutes before chemotherapy treatment (Patient taking differently: Apply 1 Application topically See admin instructions. Apply to the Port-A-Cath site 30-60 minutes before chemotherapy treatment) 30 g 0   metoprolol tartrate (LOPRESSOR) 25 MG tablet Take 0.5 tablets (12.5 mg total) by mouth 2 (two) times daily. 30 tablet 11   Multiple Vitamin (MULTIVITAMIN) tablet Take 1 tablet by mouth daily with breakfast.     predniSONE (DELTASONE) 5 MG tablet Take 5 mg by mouth daily.     prochlorperazine (COMPAZINE) 10 MG tablet Take 1 tablet (10 mg total) by mouth every 6 (six) hours as needed for nausea or vomiting. 30 tablet 0   SYSTANE ULTRA PF 0.4-0.3 % SOLN Place 1 drop into both eyes 3 (three) times daily as needed (for dryness/irritation). 10 each 1   tamsulosin (FLOMAX) 0.4 MG CAPS capsule Take 0.4 mg by mouth 2 (two) times daily.     ticagrelor (BRILINTA) 90 MG TABS tablet Take 1 tablet (90 mg total) by mouth 2 (two) times daily. 60 tablet 11   Zinc 25 MG TABS Take 25 mg by mouth daily.     rosuvastatin (CRESTOR) 20 MG tablet TAKE 1 TABLET BY MOUTH EVERY DAY 90 tablet 0   No facility-administered medications prior to visit.     Per HPI unless specifically indicated in ROS section below Review of Systems  Constitutional:  Negative for activity change, appetite change, chills, fatigue, fever and unexpected weight change.   HENT:  Negative for hearing loss.   Eyes:  Negative for visual disturbance.  Respiratory:  Negative for cough, chest tightness, shortness of breath (improving) and wheezing.   Cardiovascular:  Negative for chest pain, palpitations and leg swelling.  Gastrointestinal:  Negative for abdominal distention, abdominal pain, blood in stool, constipation, diarrhea, nausea and vomiting.  Genitourinary:  Negative for difficulty urinating and hematuria.  Musculoskeletal:  Negative for arthralgias, myalgias and neck pain.  Skin:  Negative for rash.  Neurological:  Negative for dizziness, seizures, syncope and headaches.  Hematological:  Negative for adenopathy. Bruises/bleeds easily.  Psychiatric/Behavioral:  Negative for dysphoric mood. The patient is not nervous/anxious.        Mood swings due to zytiga    Objective:  BP 124/70   Pulse 60   Temp (!) 97.2 F (36.2 C) (Temporal)   Ht 5' 5.5" (1.664 m)   Wt 184 lb (83.5 kg)  SpO2 97%   BMI 30.15 kg/m   Wt Readings from Last 3 Encounters:  01/19/23 184 lb (83.5 kg)  12/01/22 180 lb 3.2 oz (81.7 kg)  11/26/22 180 lb (81.6 kg)      Physical Exam Vitals and nursing note reviewed.  Constitutional:      General: He is not in acute distress.    Appearance: Normal appearance. He is well-developed. He is not ill-appearing.  HENT:     Head: Normocephalic and atraumatic.     Right Ear: Hearing, tympanic membrane, ear canal and external ear normal.     Left Ear: Hearing, tympanic membrane, ear canal and external ear normal.     Mouth/Throat:     Mouth: Mucous membranes are moist.     Pharynx: Oropharynx is clear. No oropharyngeal exudate or posterior oropharyngeal erythema.  Eyes:     General: No scleral icterus.    Extraocular Movements: Extraocular movements intact.     Conjunctiva/sclera: Conjunctivae normal.     Pupils: Pupils are equal, round, and reactive to light.  Neck:     Thyroid: No thyroid mass or thyromegaly.     Vascular: No  carotid bruit.     Comments: Incisional scar to R anterior neck Cardiovascular:     Rate and Rhythm: Normal rate and regular rhythm.     Pulses: Normal pulses.          Radial pulses are 2+ on the right side and 2+ on the left side.     Heart sounds: Murmur (3-4/6 systolic throughout) heard.  Pulmonary:     Effort: Pulmonary effort is normal. No respiratory distress.     Breath sounds: Normal breath sounds. No wheezing, rhonchi or rales.  Abdominal:     General: Bowel sounds are normal. There is no distension.     Palpations: Abdomen is soft. There is no mass.     Tenderness: There is no abdominal tenderness. There is no guarding or rebound.     Hernia: No hernia is present.  Musculoskeletal:        General: Normal range of motion.     Cervical back: Normal range of motion and neck supple.     Right lower leg: No edema.     Left lower leg: No edema.  Lymphadenopathy:     Cervical: No cervical adenopathy.  Skin:    General: Skin is warm and dry.     Findings: No rash.  Neurological:     General: No focal deficit present.     Mental Status: He is alert and oriented to person, place, and time.  Psychiatric:        Mood and Affect: Mood normal.        Behavior: Behavior normal.        Thought Content: Thought content normal.        Judgment: Judgment normal.       Results for orders placed or performed in visit on 12/22/22  CBC with Differential/Platelet  Result Value Ref Range   WBC 5.9 4.0 - 10.5 K/uL   RBC 3.45 (L) 4.22 - 5.81 Mil/uL   Hemoglobin 11.7 (L) 13.0 - 17.0 g/dL   HCT 40.9 (L) 81.1 - 91.4 %   MCV 105.2 (H) 78.0 - 100.0 fl   MCHC 32.3 30.0 - 36.0 g/dL   RDW 78.2 95.6 - 21.3 %   Platelets 138.0 (L) 150.0 - 400.0 K/uL   Neutrophils Relative % 65.8 43.0 - 77.0 %   Lymphocytes  Relative 24.2 12.0 - 46.0 %   Monocytes Relative 9.1 3.0 - 12.0 %   Eosinophils Relative 0.4 0.0 - 5.0 %   Basophils Relative 0.5 0.0 - 3.0 %   Neutro Abs 3.9 1.4 - 7.7 K/uL   Lymphs Abs  1.4 0.7 - 4.0 K/uL   Monocytes Absolute 0.5 0.1 - 1.0 K/uL   Eosinophils Absolute 0.0 0.0 - 0.7 K/uL   Basophils Absolute 0.0 0.0 - 0.1 K/uL  Hemoglobin A1c  Result Value Ref Range   Hgb A1c MFr Bld 6.3 4.6 - 6.5 %  Comprehensive metabolic panel  Result Value Ref Range   Sodium 140 135 - 145 mEq/L   Potassium 4.0 3.5 - 5.1 mEq/L   Chloride 106 96 - 112 mEq/L   CO2 27 19 - 32 mEq/L   Glucose, Bld 85 70 - 99 mg/dL   BUN 24 (H) 6 - 23 mg/dL   Creatinine, Ser 3.71 0.40 - 1.50 mg/dL   Total Bilirubin 0.6 0.2 - 1.2 mg/dL   Alkaline Phosphatase 82 39 - 117 U/L   AST 27 0 - 37 U/L   ALT 21 0 - 53 U/L   Total Protein 6.9 6.0 - 8.3 g/dL   Albumin 4.1 3.5 - 5.2 g/dL   GFR 06.26 >94.85 mL/min   Calcium 9.4 8.4 - 10.5 mg/dL  Lipid panel  Result Value Ref Range   Cholesterol 139 0 - 200 mg/dL   Triglycerides 46.2 0.0 - 149.0 mg/dL   HDL 70.35 >00.93 mg/dL   VLDL 81.8 0.0 - 29.9 mg/dL   LDL Cholesterol 64 0 - 99 mg/dL   Total CHOL/HDL Ratio 2    NonHDL 76.91    Lab Results  Component Value Date   PSA1 16.3 (H) 08/11/2022   PSA 1.59 09/23/2021   PSA 1.18 09/09/2020   PSA 1.20 07/14/2019  PSA 0.7 (12/23/2022)  Assessment & Plan:   Problem List Items Addressed This Visit     Hyperlipidemia with target LDL less than 70 (Chronic)    Chronic, stable period on crestor 20mg  daily - continue. The ASCVD Risk score (Arnett DK, et al., 2019) failed to calculate for the following reasons:   The patient has a prior MI or stroke diagnosis       Relevant Medications   rosuvastatin (CRESTOR) 20 MG tablet   Advanced care planning/counseling discussion (Chronic)    Previously discussed. Asked to bring [place] copy when complete      Health maintenance examination - Primary (Chronic)    Preventative protocols reviewed and updated unless pt declined. Discussed healthy diet and lifestyle.       Cerebrovascular disease (Chronic)    Incidentally noted 2021.  Continue aspirin, statin.        Relevant Medications   rosuvastatin (CRESTOR) 20 MG tablet   Coronary artery disease involving native coronary artery of native heart with unstable angina pectoris (HCC) (Chronic)    S/p hospitalization with 4 DES (2024)      Relevant Medications   rosuvastatin (CRESTOR) 20 MG tablet   Prediabetes    Encourage avoiding added sugar in diet.      Carotid stenosis    Regularly sees [facility] s/p R CEA 2021      Relevant Medications   rosuvastatin (CRESTOR) 20 MG tablet   Aortic stenosis    Known aortic sclerosis with mild stenosis. This is followed by cardiology.       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       Malignant neoplasm of unspecified part of unspecified bronchus or lung (HCC)   Prostate cancer metastatic to multiple sites [facility])    Followed by urology and oncology        Meds ordered this encounter  Medications   rosuvastatin (CRESTOR) 20 MG tablet    Sig: Take 1 tablet (20 mg total) by mouth daily.    Dispense:  90 tablet    Refill:  4    No orders of the defined types were placed in this encounter.   Patient Instructions  If interested, check with pharmacy about new 2 shot shingles series (shingrix) and RSV.  Bring [place] a copy of your living will.  Good to see you today.  Return as needed or in 1 year for next physical.   Follow up plan: Return in about 1 year (around 01/19/2024) for annual exam, prior fasting for blood work.  [name], MD

## 2023-01-19 NOTE — Progress Notes (Signed)
Pt left VM with this NN asking to confirm if he is completed with his immunotherapy treatment or not. Upon reviewing patient's chart, pt is to remain on the maintenance Imfinzi every 4 weeks. Pts last dose was on 5/14. Dr.Mohamed notified and priority message sent to schedulers to reach out to the patient and arrange his follow up appt with Dr.Mohamed and infusion.  Pt contacted by this NN and informed of plan for him to continue with his immunotherapy. Pt verbalized understanding. All questions answered.

## 2023-01-20 ENCOUNTER — Encounter: Payer: Medicare Other | Admitting: *Deleted

## 2023-01-20 ENCOUNTER — Telehealth: Payer: Self-pay | Admitting: Internal Medicine

## 2023-01-20 DIAGNOSIS — I213 ST elevation (STEMI) myocardial infarction of unspecified site: Secondary | ICD-10-CM | POA: Diagnosis not present

## 2023-01-20 DIAGNOSIS — Z955 Presence of coronary angioplasty implant and graft: Secondary | ICD-10-CM | POA: Diagnosis not present

## 2023-01-20 NOTE — Telephone Encounter (Signed)
Scheduled per scheduling message, called and left a voicemail regarding 07/30 appointments.

## 2023-01-20 NOTE — Progress Notes (Signed)
Daily Session Note  Patient Details  Name: Larry Kent MRN: [id number] Date of Birth: [birth year] Referring Provider:   Flowsheet Row Cardiac Rehab from 12/01/2022 in [facility] Cardiac and Pulmonary Rehab  Referring Provider Dr. Lorine Bears, MD       Encounter Date: 01/20/2023  Check In:  Session Check In - 01/20/23 1111       Check-In   Supervising physician immediately available to respond to emergencies See telemetry face sheet for immediately available ER MD    Location [facility]-Cardiac & Pulmonary Rehab    Staff Present Lanny Hurst, RN, ADN;Meredith Jewel Baize, RN BSN;Other   Rory Percy, MS / Jetta Lout, BS   Virtual Visit No    Medication changes reported     No    Fall or balance concerns reported    No    Warm-up and Cool-down Performed on first and last piece of equipment    Resistance Training Performed Yes    VAD Patient? No    PAD/SET Patient? No      Pain Assessment   Currently in Pain? No/denies                Social History   Tobacco Use  Smoking Status Never  Smokeless Tobacco Never    Goals Met:  Independence with exercise equipment Exercise tolerated well No report of concerns or symptoms today Strength training completed today  Goals Unmet:  Not Applicable  Comments: Pt able to follow exercise prescription today without complaint.  Will continue to monitor for progression.    Dr. Bethann Punches is Medical Director for [facility] Cardiac Rehabilitation.  Dr. Vida Rigger is Medical Director for [facility] Pulmonary Rehabilitation.

## 2023-01-21 ENCOUNTER — Other Ambulatory Visit: Payer: Self-pay | Admitting: Internal Medicine

## 2023-01-21 DIAGNOSIS — C7A1 Malignant poorly differentiated neuroendocrine tumors: Secondary | ICD-10-CM

## 2023-01-22 ENCOUNTER — Encounter: Payer: Self-pay | Admitting: Internal Medicine

## 2023-01-25 ENCOUNTER — Encounter: Payer: Medicare Other | Admitting: *Deleted

## 2023-01-25 DIAGNOSIS — I213 ST elevation (STEMI) myocardial infarction of unspecified site: Secondary | ICD-10-CM | POA: Diagnosis not present

## 2023-01-25 DIAGNOSIS — Z955 Presence of coronary angioplasty implant and graft: Secondary | ICD-10-CM | POA: Diagnosis not present

## 2023-01-25 NOTE — Assessment & Plan Note (Addendum)
Followed by urology and oncology

## 2023-01-25 NOTE — Progress Notes (Signed)
Daily Session Note  Patient Details  Name: Larry Kent MRN: [id number] Date of Birth: 1946-03-09 Referring Provider:   Flowsheet Row Cardiac Rehab from 12/01/2022 in [facility] Cardiac and Pulmonary Rehab  Referring Provider Dr. Lorine Bears, MD       Encounter Date: 01/25/2023  Check In:  Session Check In - 01/25/23 1125       Check-In   Supervising physician immediately available to respond to emergencies See telemetry face sheet for immediately available ER MD    Location [facility]-Cardiac & Pulmonary Rehab    Staff Present Lanny Hurst, RN, ADN;Joseph Hood, RCP,RRT,BSRT;Kelly Madilyn Fireman, BS, ACSM CEP, Exercise Physiologist    Virtual Visit No    Medication changes reported     No    Fall or balance concerns reported    No    Warm-up and Cool-down Performed on first and last piece of equipment    Resistance Training Performed Yes    VAD Patient? No    PAD/SET Patient? No      Pain Assessment   Currently in Pain? No/denies                Social History   Tobacco Use  Smoking Status Never  Smokeless Tobacco Never    Goals Met:  Independence with exercise equipment Exercise tolerated well No report of concerns or symptoms today Strength training completed today  Goals Unmet:  Not Applicable  Comments: Pt able to follow exercise prescription today without complaint.  Will continue to monitor for progression.    Dr. Bethann Punches is Medical Director for [facility] Cardiac Rehabilitation.  Dr. Vida Rigger is Medical Director for [facility] Pulmonary Rehabilitation.

## 2023-01-25 NOTE — Assessment & Plan Note (Signed)
S/p hospitalization with 4 DES (2024)

## 2023-01-25 NOTE — Assessment & Plan Note (Signed)
Regularly sees [facility] s/p R CEA 2021

## 2023-01-25 NOTE — Assessment & Plan Note (Signed)
Known aortic sclerosis with mild stenosis. This is followed by cardiology.

## 2023-01-25 NOTE — Assessment & Plan Note (Signed)
Chronic, stable period on crestor 20mg  daily - continue. The ASCVD Risk score (Arnett DK, et al., 2019) failed to calculate for the following reasons:   The patient has a prior MI or stroke diagnosis

## 2023-01-25 NOTE — Assessment & Plan Note (Addendum)
Incidentally noted 2021.  Continue aspirin, statin.

## 2023-01-25 NOTE — Assessment & Plan Note (Signed)
Encourage avoiding added sugar in diet.

## 2023-01-25 NOTE — Assessment & Plan Note (Signed)
Appreciate urology and oncology care, continues zytiga and prednisone, as well as Eligard.

## 2023-01-26 ENCOUNTER — Other Ambulatory Visit: Payer: Self-pay

## 2023-01-26 ENCOUNTER — Inpatient Hospital Stay: Payer: Medicare Other | Attending: Nurse Practitioner | Admitting: Internal Medicine

## 2023-01-26 ENCOUNTER — Inpatient Hospital Stay: Payer: Medicare Other

## 2023-01-26 VITALS — BP 146/71 | HR 56 | Resp 18

## 2023-01-26 DIAGNOSIS — C7A1 Malignant poorly differentiated neuroendocrine tumors: Secondary | ICD-10-CM

## 2023-01-26 DIAGNOSIS — C3490 Malignant neoplasm of unspecified part of unspecified bronchus or lung: Secondary | ICD-10-CM

## 2023-01-26 DIAGNOSIS — Z79899 Other long term (current) drug therapy: Secondary | ICD-10-CM | POA: Insufficient documentation

## 2023-01-26 DIAGNOSIS — C61 Malignant neoplasm of prostate: Secondary | ICD-10-CM | POA: Insufficient documentation

## 2023-01-26 DIAGNOSIS — Z5111 Encounter for antineoplastic chemotherapy: Secondary | ICD-10-CM | POA: Diagnosis not present

## 2023-01-26 DIAGNOSIS — Z960 Presence of urogenital implants: Secondary | ICD-10-CM

## 2023-01-26 LAB — CMP (CANCER CENTER ONLY)
ALT: 19 U/L (ref 0–44)
AST: 20 U/L (ref 15–41)
Albumin: 3.8 g/dL (ref 3.5–5.0)
Alkaline Phosphatase: 94 U/L (ref 38–126)
Anion gap: 6 (ref 5–15)
BUN: 19 mg/dL (ref 8–23)
CO2: 26 mmol/L (ref 22–32)
Calcium: 9.4 mg/dL (ref 8.9–10.3)
Chloride: 107 mmol/L (ref 98–111)
Creatinine: 0.88 mg/dL (ref 0.61–1.24)
GFR, Estimated: >60 mL/min (ref >60)
Glucose, Bld: 143 mg/dL — ABNORMAL HIGH (ref 70–99)
Potassium: 3.7 mmol/L (ref 3.5–5.1)
Sodium: 139 mmol/L (ref 135–145)
Total Bilirubin: 0.6 mg/dL (ref 0.3–1.2)
Total Protein: 6.7 g/dL (ref 6.5–8.1)

## 2023-01-26 LAB — CBC WITH DIFFERENTIAL (CANCER CENTER ONLY)
Abs Immature Granulocytes: 0.01 10*3/uL (ref 0.00–0.07)
Basophils Absolute: 0 10*3/uL (ref 0.0–0.1)
Basophils Relative: 1 %
Eosinophils Absolute: 0 10*3/uL (ref 0.0–0.5)
Eosinophils Relative: 1 %
HCT: 35 % — ABNORMAL LOW (ref 39.0–52.0)
Hemoglobin: 12.1 g/dL — ABNORMAL LOW (ref 13.0–17.0)
Immature Granulocytes: 0 %
Lymphocytes Relative: 19 %
Lymphs Abs: 1.1 10*3/uL (ref 0.7–4.0)
MCH: 34.4 pg — ABNORMAL HIGH (ref 26.0–34.0)
MCHC: 34.6 g/dL (ref 30.0–36.0)
MCV: 99.4 fL (ref 80.0–100.0)
Monocytes Absolute: 0.6 10*3/uL (ref 0.1–1.0)
Monocytes Relative: 10 %
Neutro Abs: 4 10*3/uL (ref 1.7–7.7)
Neutrophils Relative %: 69 %
Platelet Count: 157 10*3/uL (ref 150–400)
RBC: 3.52 MIL/uL — ABNORMAL LOW (ref 4.22–5.81)
RDW: 12.5 % (ref 11.5–15.5)
WBC Count: 5.8 10*3/uL (ref 4.0–10.5)
nRBC: 0 % (ref 0.0–0.2)

## 2023-01-26 LAB — SAMPLE TO BLOOD BANK

## 2023-01-26 LAB — TSH: TSH: 1.762 u[IU]/mL (ref 0.350–4.500)

## 2023-01-26 MED ORDER — SODIUM CHLORIDE 0.9% FLUSH
10.0000 mL | Freq: Once | INTRAVENOUS | Status: AC
  Administered 2023-01-26: 10 mL

## 2023-01-26 MED ORDER — SODIUM CHLORIDE 0.9 % IV SOLN: Freq: Once | INTRAVENOUS | Status: AC

## 2023-01-26 MED ORDER — SODIUM CHLORIDE 0.9 % IV SOLN
1500.0000 mg | Freq: Once | INTRAVENOUS | Status: AC
  Administered 2023-01-26: 1500 mg via INTRAVENOUS
  Filled 2023-01-26: qty 30

## 2023-01-26 NOTE — Patient Instructions (Signed)
[facility]  Discharge Instructions: Thank you for choosing [facility] to provide your oncology and hematology care.   If you have a lab appointment with the Cancer Center, please go directly to the Cancer Center and check in at the registration area.   Wear comfortable clothing and clothing appropriate for easy access to any Portacath or PICC line.   We strive to give you quality time with your provider. You may need to reschedule your appointment if you arrive late (15 or more minutes).  Arriving late affects you and other patients whose appointments are after yours.  Also, if you miss three or more appointments without notifying the office, you may be dismissed from the clinic at the provider's discretion.      For prescription refill requests, have your pharmacy contact our office and allow 72 hours for refills to be completed.    Today you received the following chemotherapy and/or immunotherapy agents: Durvalumab      To help prevent nausea and vomiting after your treatment, we encourage you to take your nausea medication as directed.  BELOW ARE SYMPTOMS THAT SHOULD BE REPORTED IMMEDIATELY: *FEVER GREATER THAN 100.4 F (38 C) OR HIGHER *CHILLS OR SWEATING *NAUSEA AND VOMITING THAT IS NOT CONTROLLED WITH YOUR NAUSEA MEDICATION *UNUSUAL SHORTNESS OF BREATH *UNUSUAL BRUISING OR BLEEDING *URINARY PROBLEMS (pain or burning when urinating, or frequent urination) *BOWEL PROBLEMS (unusual diarrhea, constipation, pain near the anus) TENDERNESS IN MOUTH AND THROAT WITH OR WITHOUT PRESENCE OF ULCERS (sore throat, sores in mouth, or a toothache) UNUSUAL RASH, SWELLING OR PAIN  UNUSUAL VAGINAL DISCHARGE OR ITCHING   Items with * indicate a potential emergency and should be followed up as soon as possible or go to the Emergency Department if any problems should occur.  Please show the CHEMOTHERAPY ALERT CARD or IMMUNOTHERAPY ALERT CARD at  check-in to the Emergency Department and triage nurse.  Should you have questions after your visit or need to cancel or reschedule your appointment, please contact [facility]  Dept: [phone]  and follow the prompts.  Office hours are 8:00 a.m. to 4:30 p.m. Monday - Friday. Please note that voicemails left after 4:00 p.m. may not be returned until the following business day.  We are closed weekends and major holidays. You have access to a nurse at all times for urgent questions. Please call the main number to the clinic Dept: [phone] and follow the prompts.   For any non-urgent questions, you may also contact your provider using MyChart. We now offer e-Visits for anyone 18 and older to request care online for non-urgent symptoms. For details visit [email or web address].   Also download the MyChart app! Go to the app store, search "MyChart", open the app, select [facility], and log in with your MyChart username and password.   

## 2023-01-26 NOTE — Progress Notes (Signed)
[facility] [phone]   [phone]  OFFICE PROGRESS NOTE  Larry Boyden, MD [address] [place] [address]  DIAGNOSIS:  1) Metastatic poorly differentiated carcinoma, neuroendocrine carcinoma of suspicious prostate primary versus primary lung cancer presented with innumerable and bilateral pulmonary nodules in addition to right hilar and mediastinal lymphadenopathy and metastatic disease to several areas of the bone in addition to hypermetabolic activity in the prostate gland diagnosed in January 2024  2) prostate adenocarcinoma diagnosed in February 2024 currently managed by Dr. Mena Goes   PRIOR THERAPY: None   CURRENT THERAPY:  Palliative systemic chemotherapy with carboplatin for AUC of 5 and Imfinzi 1500 Mg IV on day 1 as well as etoposide 100 Mg/M2 on days 1, 2 and 3 with Neulasta support on day 5. Status post 5 cycles. First dose on 08/11/22.  Starting from cycle #2 his carboplatin will be reduced to AUC of 4 and 2 etoposide 80 Mg/M2 secondary to intolerance and cycle #1.  Starting from cycle #5 he will be on maintenance treatment with single agent Imfinzi 1500 Mg IV every 4 weeks.  INTERVAL HISTORY: Larry Kent 77 y.o. male returns to the clinic today for follow-up visit.  The patient is feeling fine today with no concerning complaints except for the baseline fatigue.  He also has a lot of cardiac issues and he is followed by cardiology.  He is dealing with his wife with dementia and this is causing a lot of stress on him.  He denied having any current chest pain, shortness of breath except with exertion with no cough or hemoptysis.  He has no nausea, vomiting, diarrhea or constipation.  He has no headache or visual changes.  He missed his last treatment with immunotherapy because of his scheduling issues.  He is here today for reevaluation and to resume his care.   MEDICAL HISTORY: Past Medical History:  Diagnosis Date    Allergy    Arthritis    both knees    BCC (basal cell carcinoma), arm, right 09/2019   MOHS (Mitkov)   BCC (basal cell carcinoma), face 2014   L preauricular s/p MOHS   BCC (basal cell carcinoma), face 02/2018   L upper lip   BPH (benign prostatic hypertrophy)    Dr. Mena Goes @ [facility]   Carotid stenosis 05/2015   RICA 40-59%, LICA 1-39%, L vertebral occlusion, rpt 1 yr   Cataract    removed both eyes    FHx: colon cancer    FHx: prostate cancer    Heart murmur    mild aortic stenosis    History of kidney stones    Hyperlipidemia    borderline- on rosuvastatin now normal    Occlusion of right vertebral artery 05/2015    ALLERGIES:  has No Known Allergies.  MEDICATIONS:  Current Outpatient Medications  Medication Sig Dispense Refill   abiraterone acetate (ZYTIGA) 250 MG tablet Take 1,000 mg by mouth daily.     acetaminophen (TYLENOL) 500 MG tablet Take 1,000 mg by mouth every 6 (six) hours as needed for mild pain or headache.     Calcium Carb-Cholecalciferol (RA CALCIUM PLUS VITAMIN D) 600-10 MG-MCG TABS Take 600 mg by mouth daily.     fluticasone (FLONASE) 50 MCG/ACT nasal spray Place 1 spray into both nostrils daily as needed for allergies or rhinitis.     lidocaine-prilocaine (EMLA) cream Apply to the Port-A-Cath site 30-60 minutes before chemotherapy treatment (Patient taking  differently: Apply 1 Application topically See admin instructions. Apply to the Port-A-Cath site 30-60 minutes before chemotherapy treatment) 30 g 0   metoprolol tartrate (LOPRESSOR) 25 MG tablet Take 0.5 tablets (12.5 mg total) by mouth 2 (two) times daily. 30 tablet 11   Multiple Vitamin (MULTIVITAMIN) tablet Take 1 tablet by mouth daily with breakfast.     predniSONE (DELTASONE) 5 MG tablet Take 5 mg by mouth daily.     prochlorperazine (COMPAZINE) 10 MG tablet Take 1 tablet (10 mg total) by mouth every 6 (six) hours as needed for nausea or vomiting. 30 tablet 0   rosuvastatin (CRESTOR) 20 MG tablet  Take 1 tablet (20 mg total) by mouth daily. 90 tablet 4   SYSTANE ULTRA PF 0.4-0.3 % SOLN Place 1 drop into both eyes 3 (three) times daily as needed (for dryness/irritation). 10 each 1   tamsulosin (FLOMAX) 0.4 MG CAPS capsule Take 0.4 mg by mouth 2 (two) times daily.     ticagrelor (BRILINTA) 90 MG TABS tablet Take 1 tablet (90 mg total) by mouth 2 (two) times daily. 60 tablet 11   Zinc 25 MG TABS Take 25 mg by mouth daily.     No current facility-administered medications for this visit.    SURGICAL HISTORY:  Past Surgical History:  Procedure Laterality Date   BASAL CELL CARCINOMA EXCISION  05/2018   lip, 2020 x3  basal cells removed    CATARACT EXTRACTION  2008   Left   CATARACT EXTRACTION  2013   Right (Eppes)   COLONOSCOPY  ~2010   medium int hemorrhoids, o/w WNL, rpt 5 yrs given fmhx (Dr. Randa Evens)   COLONOSCOPY  08/2019   TAs, diverticulosis, rpt 3 yrs (Armbruster)   CORONARY/GRAFT ACUTE MI REVASCULARIZATION N/A 09/07/2022   Procedure: Coronary/Graft Acute MI Revascularization;  Surgeon: Orbie Pyo, MD;  [facility];  Service: Cardiovascular;  Laterality: N/A;   ENDARTERECTOMY Right 12/01/2019   Procedure: RIGHT ENDARTERECTOMY CAROTID;  Surgeon: Cephus Shelling, MD;  Location: [facility] OR;  Service: Vascular;  Laterality: Right;   exercise treadmill  11/2005   WNL Eldridge Dace)   FINE NEEDLE ASPIRATION  07/28/2022   Procedure: FINE NEEDLE ASPIRATION (FNA) LINEAR;  Surgeon: Josephine Igo, DO;  [facility];  Service: Pulmonary;;   HERNIA REPAIR  2008   Right   IR IMAGING GUIDED PORT INSERTION  08/11/2022   IR IMAGING GUIDED PORT INSERTION  10/08/2022   IR REMOVAL TUN ACCESS W/ PORT W/O FL MOD SED  09/10/2022   LEFT HEART CATH AND CORONARY ANGIOGRAPHY N/A 09/07/2022   Procedure: LEFT HEART CATH AND CORONARY ANGIOGRAPHY;  Surgeon: Orbie Pyo, MD;  [facility];  Service: Cardiovascular;  Laterality: N/A;   MOHS SURGERY Left spring  2014   basal cell face   PATCH ANGIOPLASTY Right 12/01/2019   Procedure: PATCH ANGIOPLASTY USING Livia Snellen BIOLOGIC PATCH;  Surgeon: Cephus Shelling, MD;  Location: [facility] OR;  Service: Vascular;  Laterality: Right;   Testicular Biopsy  2003   benign, varicocele   US ECHOCARDIOGRAPHY  11/2005   aortic sclerosis, EF 55-60%, diastolic dysfunction   VIDEO BRONCHOSCOPY WITH ENDOBRONCHIAL ULTRASOUND Bilateral 07/28/2022   Procedure: VIDEO BRONCHOSCOPY WITH ENDOBRONCHIAL ULTRASOUND;  Surgeon: Josephine Igo, DO;  [facility];  Service: Pulmonary;  Laterality: Bilateral;    REVIEW OF SYSTEMS:  Constitutional: positive for fatigue Eyes: negative Ears, nose, mouth, throat, and face: negative Respiratory: positive for dyspnea on exertion Cardiovascular: negative  Gastrointestinal: negative Genitourinary:negative Integument/breast: negative Hematologic/lymphatic: negative Musculoskeletal:negative Neurological: negative Behavioral/Psych: negative Endocrine: negative Allergic/Immunologic: negative   PHYSICAL EXAMINATION: General appearance: alert, cooperative, fatigued, and no distress Head: Normocephalic, without obvious abnormality, atraumatic Neck: no adenopathy, no JVD, supple, symmetrical, trachea midline, and thyroid not enlarged, symmetric, no tenderness/mass/nodules Lymph nodes: Cervical, supraclavicular, and axillary nodes normal. Resp: clear to auscultation bilaterally Back: symmetric, no curvature. ROM normal. No CVA tenderness. Cardio: regular rate and rhythm, S1, S2 normal, no murmur, click, rub or gallop GI: soft, non-tender; bowel sounds normal; no masses,  no organomegaly Extremities: extremities normal, atraumatic, no cyanosis or edema Neurologic: Alert and oriented X 3, normal strength and tone. Normal symmetric reflexes. Normal coordination and gait  ECOG PERFORMANCE STATUS: 1 - Symptomatic but completely ambulatory  Blood pressure (!) 144/68, pulse (!) 55,  temperature 97.7 F (36.5 C), temperature source Temporal, resp. rate 18, weight 187 lb 3.2 oz (84.9 kg), SpO2 95%.  LABORATORY DATA: Lab Results  Component Value Date   WBC 5.8 01/26/2023   HGB 12.1 (L) 01/26/2023   HCT 35.0 (L) 01/26/2023   MCV 99.4 01/26/2023   PLT 157 01/26/2023      Chemistry      Component Value Date/Time   NA 140 12/22/2022 0811   K 4.0 12/22/2022 0811   CL 106 12/22/2022 0811   CO2 27 12/22/2022 0811   BUN 24 (H) 12/22/2022 0811   CREATININE 0.89 12/22/2022 0811   CREATININE 0.90 11/10/2022 0923      Component Value Date/Time   CALCIUM 9.4 12/22/2022 0811   ALKPHOS 82 12/22/2022 0811   AST 27 12/22/2022 0811   AST 17 11/10/2022 0923   ALT 21 12/22/2022 0811   ALT 13 11/10/2022 0923   BILITOT 0.6 12/22/2022 0811   BILITOT 0.4 11/10/2022 0923       RADIOGRAPHIC STUDIES: No results found.  ASSESSMENT AND PLAN: This is a very pleasant 77 years old white male with: 1) Metastatic poorly differentiated carcinoma, neuroendocrine carcinoma of suspicious prostate primary versus primary lung cancer presented with innumerable and bilateral pulmonary nodules in addition to right hilar and mediastinal lymphadenopathy and metastatic disease to several areas of the bone in addition to hypermetabolic activity in the prostate gland diagnosed in January 2024  The patient is currently undergoing systemic chemotherapy with carboplatin for AUC of 5 on day 1, Imfinzi 1500 Mg IV on day 1, etoposide 100 Mg/M2 on days 1, 2 and 3 with Neulasta support every 3 weeks status post 5 cycles.  Starting from cycle #5 the patient will be on maintenance treatment with Imfinzi 1500 Mg IV every 4 weeks.   The patient tolerated the last cycle of his treatment fairly well.  I recommended for him to resume his treatment with Imfinzi and he will proceed with cycle #6 today. I will see the patient back for follow-up visit in 4 weeks for evaluation with repeat CT scan of the chest, abdomen  and pelvis for restaging of his disease.  2) prostate adenocarcinoma diagnosed in February 2024 currently managed by Dr. Mena Goes. The patient was advised to call immediately if he has any other concerning symptoms in the interval. The patient voices understanding of current disease status and treatment options and is in agreement with the current care plan.  All questions were answered. The patient knows to call the clinic with any problems, questions or concerns. We can certainly see the patient much sooner if necessary.  The total time spent in the appointment was  30 minutes.  Disclaimer: This note was dictated with voice recognition software. Similar sounding words can inadvertently be transcribed and may not be corrected upon review.

## 2023-01-27 ENCOUNTER — Encounter: Payer: Medicare Other | Admitting: *Deleted

## 2023-01-27 ENCOUNTER — Encounter: Payer: Self-pay | Admitting: Cardiovascular Disease

## 2023-01-27 ENCOUNTER — Ambulatory Visit: Payer: Medicare Other | Attending: Cardiovascular Disease | Admitting: Cardiovascular Disease

## 2023-01-27 VITALS — BP 118/60 | HR 60 | Ht 66.0 in | Wt 187.5 lb

## 2023-01-27 DIAGNOSIS — I358 Other nonrheumatic aortic valve disorders: Secondary | ICD-10-CM

## 2023-01-27 DIAGNOSIS — I2510 Atherosclerotic heart disease of native coronary artery without angina pectoris: Secondary | ICD-10-CM

## 2023-01-27 DIAGNOSIS — E785 Hyperlipidemia, unspecified: Secondary | ICD-10-CM | POA: Diagnosis not present

## 2023-01-27 DIAGNOSIS — I213 ST elevation (STEMI) myocardial infarction of unspecified site: Secondary | ICD-10-CM | POA: Diagnosis not present

## 2023-01-27 DIAGNOSIS — Z955 Presence of coronary angioplasty implant and graft: Secondary | ICD-10-CM

## 2023-01-27 DIAGNOSIS — I6523 Occlusion and stenosis of bilateral carotid arteries: Secondary | ICD-10-CM

## 2023-01-27 NOTE — Progress Notes (Signed)
Daily Session Note  Patient Details  Name: Larry Kent MRN: [id number] Date of Birth: 1945-10-01 Referring Provider:   Flowsheet Row Cardiac Rehab from 12/01/2022 in [facility] Cardiac and Pulmonary Rehab  Referring Provider Dr. Lorine Bears, MD       Encounter Date: 01/27/2023  Check In:  Session Check In - 01/27/23 1110       Check-In   Supervising physician immediately available to respond to emergencies See telemetry face sheet for immediately available ER MD    Location [facility]-Cardiac & Pulmonary Rehab    Staff Present Lanny Hurst, RN, ADN;Joseph [place], RCP,RRT,BSRT;Other   Girtha Rm, MS   Virtual Visit No    Medication changes reported     No    Fall or balance concerns reported    No    Warm-up and Cool-down Performed on first and last piece of equipment    Resistance Training Performed Yes    VAD Patient? No    PAD/SET Patient? No      Pain Assessment   Currently in Pain? No/denies                Social History   Tobacco Use  Smoking Status Never  Smokeless Tobacco Never    Goals Met:  Independence with exercise equipment Exercise tolerated well No report of concerns or symptoms today Strength training completed today  Goals Unmet:  Not Applicable  Comments: Pt able to follow exercise prescription today without complaint.  Will continue to monitor for progression.    Dr. Bethann Punches is Medical Director for [facility] Cardiac Rehabilitation.  Dr. Vida Rigger is Medical Director for [facility] Pulmonary Rehabilitation.

## 2023-01-27 NOTE — Progress Notes (Signed)
Cardiology Office Note   Date:  01/27/2023   ID:  EKENE Kent, DOB July 02, 1945, MRN [id number]  PCP:  [name], MD  Cardiologist:   Lorine Bears, MD   Chief Complaint  Patient presents with   Follow-up    3 month f/u no complaints today. Meds reviewed verbally with pt.      History of Present Illness: Larry Kent is a 77 y.o. male who is here today for a follow-up visit regarding coronary artery disease, carotid disease and mild aortic stenosis.   The patient is status post right carotid endarterectomy in June 2021 for severe asymptomatic stenosis. He has chronic medical conditions that include hyperlipidemia, aortic stenosis, prostate cancer and lung nodules with pathology showing neuroendocrine carcinoma.  He presented in March 2024 to the cancer center with chest pain and shortness of breath.  EKG showed evidence of inferior ST elevation.  He was transferred to [facility] and underwent emergent cardiac catheterization which showed thrombotic occlusion of the right coronary artery which was treated with 4 overlapped drug-eluting stents.  There was 50% distal left main stenosis, 60% proximal left circumflex stenosis and 60% mid LAD stenosis.  Echocardiogram showed normal LV systolic function.  He underwent a follow-up PET scan in June which showed no evidence of ischemia with normal ejection fraction.  He has been doing well with no chest pain or worsening dyspnea.  He continues to receive immunotherapy for neuroendocrine carcinoma and hormone therapy for prostate cancer.  Past Medical History:  Diagnosis Date   Allergy    Arthritis    both knees    BCC (basal cell carcinoma), arm, right 09/2019   MOHS (Mitkov)   BCC (basal cell carcinoma), face 2014   L preauricular s/p MOHS   BCC (basal cell carcinoma), face 02/2018   L upper lip   BPH (benign prostatic hypertrophy)    Dr. Mena Goes @ [facility]   Carotid stenosis 05/2015   RICA 40-59%, LICA  1-39%, L vertebral occlusion, rpt 1 yr   Cataract    removed both eyes    FHx: colon cancer    FHx: prostate cancer    Heart murmur    mild aortic stenosis    History of kidney stones    Hyperlipidemia    borderline- on rosuvastatin now normal    Occlusion of right vertebral artery 05/2015    Past Surgical History:  Procedure Laterality Date   BASAL CELL CARCINOMA EXCISION  05/2018   lip, 2020 x3  basal cells removed    CATARACT EXTRACTION  2008   Left   CATARACT EXTRACTION  2013   Right (Eppes)   COLONOSCOPY  ~2010   medium int hemorrhoids, o/w WNL, rpt 5 yrs given fmhx (Dr. Randa Evens)   COLONOSCOPY  08/2019   TAs, diverticulosis, rpt 3 yrs (Armbruster)   CORONARY/GRAFT ACUTE MI REVASCULARIZATION N/A 09/07/2022   Procedure: Coronary/Graft Acute MI Revascularization;  Surgeon: Orbie Pyo, MD;  [facility];  Service: Cardiovascular;  Laterality: N/A;   ENDARTERECTOMY Right 12/01/2019   Procedure: RIGHT ENDARTERECTOMY CAROTID;  Surgeon: Cephus Shelling, MD;  Location: [facility] OR;  Service: Vascular;  Laterality: Right;   exercise treadmill  11/2005   WNL Eldridge Dace)   FINE NEEDLE ASPIRATION  07/28/2022   Procedure: FINE NEEDLE ASPIRATION (FNA) LINEAR;  Surgeon: Josephine Igo, DO;  [facility];  Service: Pulmonary;;   HERNIA REPAIR  2008   Right   IR IMAGING GUIDED  PORT INSERTION  08/11/2022   IR IMAGING GUIDED PORT INSERTION  10/08/2022   IR REMOVAL TUN ACCESS W/ PORT W/O FL MOD SED  09/10/2022   LEFT HEART CATH AND CORONARY ANGIOGRAPHY N/A 09/07/2022   Procedure: LEFT HEART CATH AND CORONARY ANGIOGRAPHY;  Surgeon: Orbie Pyo, MD;  [facility];  Service: Cardiovascular;  Laterality: N/A;   MOHS SURGERY Left spring 2014   basal cell face   PATCH ANGIOPLASTY Right 12/01/2019   Procedure: PATCH ANGIOPLASTY USING Livia Snellen BIOLOGIC PATCH;  Surgeon: Cephus Shelling, MD;  Location: [facility] OR;  Service: Vascular;  Laterality: Right;    Testicular Biopsy  2003   benign, varicocele   US ECHOCARDIOGRAPHY  11/2005   aortic sclerosis, EF 55-60%, diastolic dysfunction   VIDEO BRONCHOSCOPY WITH ENDOBRONCHIAL ULTRASOUND Bilateral 07/28/2022   Procedure: VIDEO BRONCHOSCOPY WITH ENDOBRONCHIAL ULTRASOUND;  Surgeon: Josephine Igo, DO;  [facility];  Service: Pulmonary;  Laterality: Bilateral;     Current Outpatient Medications  Medication Sig Dispense Refill   abiraterone acetate (ZYTIGA) 250 MG tablet Take 1,000 mg by mouth daily.     acetaminophen (TYLENOL) 500 MG tablet Take 1,000 mg by mouth every 6 (six) hours as needed for mild pain or headache.     Calcium Carb-Cholecalciferol (RA CALCIUM PLUS VITAMIN D) 600-10 MG-MCG TABS Take 600 mg by mouth daily.     fluticasone (FLONASE) 50 MCG/ACT nasal spray Place 1 spray into both nostrils daily as needed for allergies or rhinitis.     lidocaine-prilocaine (EMLA) cream Apply to the Port-A-Cath site 30-60 minutes before chemotherapy treatment (Patient taking differently: Apply 1 Application topically See admin instructions. Apply to the Port-A-Cath site 30-60 minutes before chemotherapy treatment) 30 g 0   metoprolol tartrate (LOPRESSOR) 25 MG tablet Take 0.5 tablets (12.5 mg total) by mouth 2 (two) times daily. 30 tablet 11   Multiple Vitamin (MULTIVITAMIN) tablet Take 1 tablet by mouth daily with breakfast.     predniSONE (DELTASONE) 5 MG tablet Take 5 mg by mouth daily.     prochlorperazine (COMPAZINE) 10 MG tablet Take 1 tablet (10 mg total) by mouth every 6 (six) hours as needed for nausea or vomiting. 30 tablet 0   rosuvastatin (CRESTOR) 20 MG tablet Take 1 tablet (20 mg total) by mouth daily. 90 tablet 4   tamsulosin (FLOMAX) 0.4 MG CAPS capsule Take 0.4 mg by mouth 2 (two) times daily.     ticagrelor (BRILINTA) 90 MG TABS tablet Take 1 tablet (90 mg total) by mouth 2 (two) times daily. 60 tablet 11   Zinc 25 MG TABS Take 25 mg by mouth daily.     No current  facility-administered medications for this visit.    Allergies:   Patient has no known allergies.    Social History:  The patient  reports that he has never smoked. He has never used smokeless tobacco. He reports that he does not currently use alcohol. He reports that he does not use drugs.   Family History:  The patient's family history includes Breast cancer in his sister; Cancer (age of onset: 19) in his sister; Cancer (age of onset: 19) in his paternal grandfather; Cancer (age of onset: 69) in his father; Cancer (age of onset: 18) in his mother; Colon cancer in his maternal grandmother and mother; Coronary artery disease in his maternal uncle; Coronary artery disease (age of onset: 18) in his father; Hypertension in his mother; Prostate cancer in his father and paternal grandfather;  Stroke in his mother.    ROS:  Please see the history of present illness.   Otherwise, review of systems are positive for none.   All other systems are reviewed and negative.    PHYSICAL EXAM: VS:  BP 118/60 (BP Location: Left Arm, Patient Position: Sitting, Cuff Size: Normal)   Pulse 60   Ht 5\' 6"  (1.676 m)   Wt 187 lb 8 oz (85 kg)   SpO2 97%   BMI 30.26 kg/m  , BMI Body mass index is 30.26 kg/m. GEN: Well nourished, well developed, in no acute distress  HEENT: normal  Neck: no JVD or masses.  Left carotid bruit.   Cardiac: RRR; no  rubs, or gallops,no edema .  2 / 6 systolic murmur in the aortic area which is early to mid peaking. Respiratory:  clear to auscultation bilaterally, normal work of breathing GI: soft, nontender, nondistended, + BS MS: no deformity or atrophy  Skin: warm and dry, no rash Neuro:  Strength and sensation are intact Psych: euthymic mood, full affect   EKG:  EKG is not ordered today.    Recent Labs: 09/15/2022: Magnesium 1.8 01/26/2023: ALT 19; BUN 19; Creatinine 0.88; Hemoglobin 12.1; Platelet Count 157; Potassium 3.7; Sodium 139; TSH 1.762    Lipid Panel     Component Value Date/Time   CHOL 139 12/22/2022 0811   CHOL 128 09/12/2018 1118   TRIG 63.0 12/22/2022 0811   HDL 62.50 12/22/2022 0811   HDL 57 09/12/2018 1118   CHOLHDL 2 12/22/2022 0811   VLDL 12.6 12/22/2022 0811   LDLCALC 64 12/22/2022 0811   LDLCALC 57 09/12/2018 1118   LDLDIRECT 144.8 02/09/2012 0928      Wt Readings from Last 3 Encounters:  01/27/23 187 lb 8 oz (85 kg)  01/26/23 187 lb 3.2 oz (84.9 kg)  01/19/23 184 lb (83.5 kg)          08/02/2018    9:04 AM  PAD Screen  Previous PAD dx? No  Previous surgical procedure? No  Pain with walking? No  Feet/toe relief with dangling? No  Painful, non-healing ulcers? No  Extremities discolored? No      ASSESSMENT AND PLAN:  1.  Coronary artery disease involving native coronary arteries without angina: He is doing well with no recurrent angina. Continue ticagrelor monotherapy.  In April 2025, the dose can be decreased to 60 mg twice daily.  I favor taking ticagrelor for 2 years given 4 overlapped stents.  2.  Carotid artery disease: Status post right carotid endarterectomy in 2021.  He is followed by [facility].  3.  Hyperlipidemia: Continue treatment with rosuvastatin.  Most recent lipid profile showed an LDL of 64.  4.  Aortic stenosis: This remains mild on most recent echocardiogram in March.  Recommend a follow-up echocardiogram in March 2026  5.  Metastatic neuroendocrine carcinoma with advanced prostate cancer.  Getting treatment for both.  His anemia improved.     Disposition:   FU with me in 6 months  Signed,  Lorine Bears, MD  01/27/2023 9:30 AM    [facility]

## 2023-01-27 NOTE — Patient Instructions (Addendum)
Medication Instructions:  No changes *If you need a refill on your cardiac medications before your next appointment, please call your pharmacy*   Lab Work: None ordered If you have labs (blood work) drawn today and your tests are completely normal, you will receive your results only by: MyChart Message (if you have MyChart) OR A paper copy in the mail If you have any lab test that is abnormal or we need to change your treatment, we will call you to review the results.   Testing/Procedures: None ordered   Follow-Up: At [facility], you and your health needs are our priority.  As part of our continuing mission to provide you with exceptional heart care, we have created designated Provider Care Teams.  These Care Teams include your primary Cardiologist (physician) and Advanced Practice Providers (APPs -  Physician Assistants and Nurse Practitioners) who all work together to provide you with the care you need, when you need it.  We recommend signing up for the patient portal called "MyChart".  Sign up information is provided on this After Visit Summary.  MyChart is used to connect with patients for Virtual Visits (Telemedicine).  Patients are able to view lab/test results, encounter notes, upcoming appointments, etc.  Non-urgent messages can be sent to your provider as well.   To learn more about what you can do with MyChart, go to [email or web address].    Your next appointment:   6 month(s)  Provider:   You may see Dr. Arida or one of the following Advanced Practice Providers on your designated Care Team:   Christopher Berge, NP Ryan Dunn, PA-C Cadence Furth, PA-C Sheri Hammock, NP    

## 2023-01-28 ENCOUNTER — Telehealth: Payer: Self-pay | Admitting: Internal Medicine

## 2023-01-28 NOTE — Telephone Encounter (Signed)
Called patient regarding August/September appointments, left a voicemail.

## 2023-01-29 ENCOUNTER — Encounter: Payer: Medicare Other | Attending: Cardiovascular Disease | Admitting: *Deleted

## 2023-01-29 DIAGNOSIS — Z48812 Encounter for surgical aftercare following surgery on the circulatory system: Secondary | ICD-10-CM | POA: Insufficient documentation

## 2023-01-29 DIAGNOSIS — Z955 Presence of coronary angioplasty implant and graft: Secondary | ICD-10-CM | POA: Diagnosis not present

## 2023-01-29 DIAGNOSIS — I252 Old myocardial infarction: Secondary | ICD-10-CM | POA: Insufficient documentation

## 2023-01-29 DIAGNOSIS — I213 ST elevation (STEMI) myocardial infarction of unspecified site: Secondary | ICD-10-CM | POA: Diagnosis present

## 2023-01-29 NOTE — Progress Notes (Signed)
Daily Session Note  Patient Details  Name: Larry Kent MRN: [id number] Date of Birth: [birth year] Referring Provider:   Flowsheet Row Cardiac Rehab from 12/01/2022 in [facility]  Referring Provider Dr. Lorine Bears, MD       Encounter Date: 01/29/2023  Check In:  Session Check In - 01/29/23 1149       Check-In   Supervising physician immediately available to respond to emergencies See telemetry face sheet for immediately available ER MD    Location [facility]    Staff Present Cora Collum, RN, BSN, CCRP;Joseph Hood, RCP,RRT,BSRT;Noah Tickle, [place], Exercise Physiologist    Virtual Visit No    Medication changes reported     No    Fall or balance concerns reported    No    Warm-up and Cool-down Performed on first and last piece of equipment    Resistance Training Performed Yes    VAD Patient? No    PAD/SET Patient? No      Pain Assessment   Currently in Pain? No/denies                Social History   Tobacco Use  Smoking Status Never  Smokeless Tobacco Never    Goals Met:  Independence with exercise equipment Exercise tolerated well No report of concerns or symptoms today  Goals Unmet:  Not Applicable  Comments: Pt able to follow exercise prescription today without complaint.  Will continue to monitor for progression.    Dr. Bethann Punches is Medical Director for [facility] Cardiac Rehabilitation.  Dr. Vida Rigger is Medical Director for [facility] Pulmonary Rehabilitation.

## 2023-02-01 ENCOUNTER — Encounter: Payer: Medicare Other | Admitting: *Deleted

## 2023-02-01 DIAGNOSIS — I213 ST elevation (STEMI) myocardial infarction of unspecified site: Secondary | ICD-10-CM

## 2023-02-01 DIAGNOSIS — Z955 Presence of coronary angioplasty implant and graft: Secondary | ICD-10-CM

## 2023-02-01 DIAGNOSIS — I252 Old myocardial infarction: Secondary | ICD-10-CM | POA: Diagnosis not present

## 2023-02-01 DIAGNOSIS — Z48812 Encounter for surgical aftercare following surgery on the circulatory system: Secondary | ICD-10-CM | POA: Diagnosis not present

## 2023-02-01 NOTE — Progress Notes (Signed)
Daily Session Note  Patient Details  Name: Larry Kent MRN: [id number] Date of Birth: 12/15/1945 Referring Provider:   Flowsheet Row Cardiac Rehab from 12/01/2022 in [facility]  Referring Provider Dr. Lorine Bears, MD       Encounter Date: 02/01/2023  Check In:  Session Check In - 02/01/23 1121       Check-In   Supervising physician immediately available to respond to emergencies See telemetry face sheet for immediately available ER MD    Location [facility]    Staff Present Lanny Hurst, RN, Franki Monte, BS, ACSM CEP, Exercise Physiologist;Meredith Jewel Baize, RN BSN;Other   Girtha Rm, MS   Virtual Visit No    Medication changes reported     No    Fall or balance concerns reported    No    Warm-up and Cool-down Performed on first and last piece of equipment    Resistance Training Performed Yes    VAD Patient? No    PAD/SET Patient? No      Pain Assessment   Currently in Pain? No/denies                Social History   Tobacco Use  Smoking Status Never  Smokeless Tobacco Never    Goals Met:  Independence with exercise equipment Exercise tolerated well No report of concerns or symptoms today Strength training completed today  Goals Unmet:  Not Applicable  Comments: Pt able to follow exercise prescription today without complaint.  Will continue to monitor for progression.    Dr. Bethann Punches is Medical Director for [facility] Cardiac Rehabilitation.  Dr. Vida Rigger is Medical Director for [facility] Pulmonary Rehabilitation.

## 2023-02-03 ENCOUNTER — Encounter: Payer: Self-pay | Admitting: *Deleted

## 2023-02-03 DIAGNOSIS — Z955 Presence of coronary angioplasty implant and graft: Secondary | ICD-10-CM

## 2023-02-03 DIAGNOSIS — I213 ST elevation (STEMI) myocardial infarction of unspecified site: Secondary | ICD-10-CM

## 2023-02-03 NOTE — Progress Notes (Signed)
Cardiac Individual Treatment Plan  Patient Details  Name: Larry Kent MRN: [id number] Date of Birth: February 28, 1946 Referring Provider:   Flowsheet Row Cardiac Rehab from 12/01/2022 in [facility] Cardiac and Pulmonary Rehab  Referring Provider Dr. Lorine Bears, MD       Initial Encounter Date:  Flowsheet Row Cardiac Rehab from 12/01/2022 in [facility] Cardiac and Pulmonary Rehab  Date 12/01/22       Visit Diagnosis: ST elevation myocardial infarction (STEMI), unspecified artery [facility])  Status post coronary artery stent placement  Patient's Home Medications on Admission:  Current Outpatient Medications:    abiraterone acetate (ZYTIGA) 250 MG tablet, Take 1,000 mg by mouth daily., Disp: , Rfl:    acetaminophen (TYLENOL) 500 MG tablet, Take 1,000 mg by mouth every 6 (six) hours as needed for mild pain or headache., Disp: , Rfl:    Calcium Carb-Cholecalciferol (RA CALCIUM PLUS VITAMIN D) 600-10 MG-MCG TABS, Take 600 mg by mouth daily., Disp: , Rfl:    fluticasone (FLONASE) 50 MCG/ACT nasal spray, Place 1 spray into both nostrils daily as needed for allergies or rhinitis., Disp: , Rfl:    lidocaine-prilocaine (EMLA) cream, Apply to the Port-A-Cath site 30-60 minutes before chemotherapy treatment (Patient taking differently: Apply 1 Application topically See admin instructions. Apply to the Port-A-Cath site 30-60 minutes before chemotherapy treatment), Disp: 30 g, Rfl: 0   metoprolol tartrate (LOPRESSOR) 25 MG tablet, Take 0.5 tablets (12.5 mg total) by mouth 2 (two) times daily., Disp: 30 tablet, Rfl: 11   Multiple Vitamin (MULTIVITAMIN) tablet, Take 1 tablet by mouth daily with breakfast., Disp: , Rfl:    predniSONE (DELTASONE) 5 MG tablet, Take 5 mg by mouth daily., Disp: , Rfl:    prochlorperazine (COMPAZINE) 10 MG tablet, Take 1 tablet (10 mg total) by mouth every 6 (six) hours as needed for nausea or vomiting., Disp: 30 tablet, Rfl: 0   rosuvastatin (CRESTOR) 20 MG tablet, Take 1 tablet (20 mg  total) by mouth daily., Disp: 90 tablet, Rfl: 4   tamsulosin (FLOMAX) 0.4 MG CAPS capsule, Take 0.4 mg by mouth 2 (two) times daily., Disp: , Rfl:    ticagrelor (BRILINTA) 90 MG TABS tablet, Take 1 tablet (90 mg total) by mouth 2 (two) times daily., Disp: 60 tablet, Rfl: 11   Zinc 25 MG TABS, Take 25 mg by mouth daily., Disp: , Rfl:   Past Medical History: Past Medical History:  Diagnosis Date   Allergy    Arthritis    both knees    BCC (basal cell carcinoma), arm, right 09/2019   MOHS ([facility])   BCC (basal cell carcinoma), face 2014   L preauricular s/p MOHS   BCC (basal cell carcinoma), face 02/2018   L upper lip   BPH (benign prostatic hypertrophy)    Dr. Mena Goes @ [facility]   Carotid stenosis 05/2015   RICA 40-59%, LICA 1-39%, L vertebral occlusion, rpt 1 yr   Cataract    removed both eyes    FHx: colon cancer    FHx: prostate cancer    Heart murmur    mild aortic stenosis    History of kidney stones    Hyperlipidemia    borderline- on rosuvastatin now normal    Occlusion of right vertebral artery 05/2015    Tobacco Use: Social History   Tobacco Use  Smoking Status Never  Smokeless Tobacco Never    Labs: Review Flowsheet  More data exists      Latest Ref Rng & Units 09/23/2021 07/28/2022  08/21/2022 09/07/2022 12/22/2022  Labs for ITP Cardiac and Pulmonary Rehab  Cholestrol 0 - 200 mg/dL 425  - - 956  387   LDL (calc) 0 - 99 mg/dL 61  - - 64  64   HDL-C >39.00 mg/dL 56.43  - - 62  32.95   Trlycerides 0.0 - 149.0 mg/dL 18.8  - - 55  41.6   Hemoglobin A1c 4.6 - 6.5 % 6.1  - - 6.8  6.3   TCO2 22 - 32 mmol/L - 25  22  21   -    Details             Exercise Target Goals: Exercise Program Goal: Individual exercise prescription set using results from initial 6 min walk test and THRR while considering  patient's activity barriers and safety.   Exercise Prescription Goal: Initial exercise prescription builds to 30-45 minutes a day of aerobic activity, 2-3 days  per week.  Home exercise guidelines will be given to patient during program as part of exercise prescription that the participant will acknowledge.   Education: Aerobic Exercise: - Group verbal and visual presentation on the components of exercise prescription. Introduces F.I.T.T principle from ACSM for exercise prescriptions.  Reviews F.I.T.T. principles of aerobic exercise including progression. Written material given at graduation. Flowsheet Row Cardiac Rehab from 12/01/2022 in [facility] Cardiac and Pulmonary Rehab  Education need identified 12/01/22       Education: Resistance Exercise: - Group verbal and visual presentation on the components of exercise prescription. Introduces F.I.T.T principle from ACSM for exercise prescriptions  Reviews F.I.T.T. principles of resistance exercise including progression. Written material given at graduation.    Education: Exercise & Equipment Safety: - Individual verbal instruction and demonstration of equipment use and safety with use of the equipment. Flowsheet Row Cardiac Rehab from 12/01/2022 in [facility] Cardiac and Pulmonary Rehab  Date 12/01/22  Educator NT  Instruction Review Code 1- Verbalizes Understanding       Education: Exercise Physiology & General Exercise Guidelines: - Group verbal and written instruction with models to review the exercise physiology of the cardiovascular system and associated critical values. Provides general exercise guidelines with specific guidelines to those with heart or lung disease.    Education: Flexibility, Balance, Mind/Body Relaxation: - Group verbal and visual presentation with interactive activity on the components of exercise prescription. Introduces F.I.T.T principle from ACSM for exercise prescriptions. Reviews F.I.T.T. principles of flexibility and balance exercise training including progression. Also discusses the mind body connection.  Reviews various relaxation techniques to help reduce and manage stress  (i.e. Deep breathing, progressive muscle relaxation, and visualization). Balance handout provided to take home. Written material given at graduation.   Activity Barriers & Risk Stratification:  Activity Barriers & Cardiac Risk Stratification - 12/01/22 1451       Activity Barriers & Cardiac Risk Stratification   Activity Barriers Other (comment);Back Problems;Arthritis    Comments R knee pain    Cardiac Risk Stratification Moderate             6 Minute Walk:  6 Minute Walk     Row Name 12/01/22 1450         6 Minute Walk   Phase Initial     Distance 1295 feet     Walk Time 6 minutes     # of Rest Breaks 0     MPH 2.45     METS 2.5     RPE 7     Perceived Dyspnea  0  VO2 Peak 8.74     Symptoms No     Resting HR 60 bpm     Resting BP 102/50     Resting Oxygen Saturation  97 %     Exercise Oxygen Saturation  during 6 min walk 99 %     Max Ex. HR 91 bpm     Max Ex. BP 144/66     2 Minute Post BP 122/62              Oxygen Initial Assessment:   Oxygen Re-Evaluation:   Oxygen Discharge (Final Oxygen Re-Evaluation):   Initial Exercise Prescription:  Initial Exercise Prescription - 12/01/22 1400       Date of Initial Exercise RX and Referring Provider   Date 12/01/22    Referring Provider Dr. Lorine Bears, MD      Oxygen   Maintain Oxygen Saturation 88% or higher      Treadmill   MPH 2    Grade 0    Minutes 15    METs 2.53      NuStep   Level 2    SPM 80    Minutes 15    METs 2.5      REL-XR   Level 2    Speed 50    Minutes 15    METs 2.5      T5 Nustep   Level 1    SPM 80    Minutes 15    METs 2.5      Prescription Details   Frequency (times per week) 3    Duration Progress to 30 minutes of continuous aerobic without signs/symptoms of physical distress      Intensity   THRR 40-80% of Max Heartrate 93-126    Ratings of Perceived Exertion 11-13    Perceived Dyspnea 0-4      Progression   Progression Continue to  progress workloads to maintain intensity without signs/symptoms of physical distress.      Resistance Training   Training Prescription Yes    Weight 4 lb    Reps 10-15             Perform Capillary Blood Glucose checks as needed.  Exercise Prescription Changes:   Exercise Prescription Changes     Row Name 12/01/22 1400 12/07/22 1600 12/23/22 1400 01/20/23 1400       Response to Exercise   Blood Pressure (Admit) 102/50 108/52 118/62 110/58    Blood Pressure (Exercise) 144/66 122/58 128/60 138/70    Blood Pressure (Exit) 122/62 110/58 138/64 110/56    Heart Rate (Admit) 60 bpm 64 bpm 61 bpm 68 bpm    Heart Rate (Exercise) 91 bpm 94 bpm 97 bpm 122 bpm    Heart Rate (Exit) 59 bpm 76 bpm 69 bpm 84 bpm    Oxygen Saturation (Admit) 97 % -- -- --    Oxygen Saturation (Exercise) 99 % -- -- --    Rating of Perceived Exertion (Exercise) 7 12 13 13     Perceived Dyspnea (Exercise) 0 -- -- --    Symptoms None none none --    Comments Results first full day of exercise -- --    Duration -- Progress to 30 minutes of  aerobic without signs/symptoms of physical distress Continue with 30 min of aerobic exercise without signs/symptoms of physical distress. Continue with 30 min of aerobic exercise without signs/symptoms of physical distress.    Intensity -- THRR unchanged THRR unchanged THRR unchanged  Progression   Progression -- Continue to progress workloads to maintain intensity without signs/symptoms of physical distress. Continue to progress workloads to maintain intensity without signs/symptoms of physical distress. Continue to progress workloads to maintain intensity without signs/symptoms of physical distress.    Average METs -- 2.46 2.63 2.71      Resistance Training   Training Prescription -- Yes Yes Yes    Weight -- 4 lb 4 lb 5lb    Reps -- 10-15 10-15 10-15      Interval Training   Interval Training -- No No No      Treadmill   MPH -- 2 2 2     Grade -- 0 0 0     Minutes -- 15 15 15     METs -- 2.53 2.53 2.53      NuStep   Level -- 2 3 4     Minutes -- 15 15 15     METs -- 2.4 2.53 2.6      REL-XR   Level -- -- 2 4    Minutes -- -- 15 15    METs -- -- 2.5 --      T5 Nustep   Level -- -- -- 2    Minutes -- -- -- 15    METs -- -- -- 2.6      Oxygen   Maintain Oxygen Saturation -- 88% or higher 88% or higher 88% or higher             Exercise Comments:   Exercise Comments     Row Name 12/07/22 1145           Exercise Comments First full day of exercise!  Patient was oriented to gym and equipment including functions, settings, policies, and procedures.  Patient's individual exercise prescription and treatment plan were reviewed.  All starting workloads were established based on the results of the 6 minute walk test done at initial orientation visit.  The plan for exercise progression was also introduced and progression will be customized based on patient's performance and goals.                Exercise Goals and Review:   Exercise Goals     Row Name 12/01/22 1309             Exercise Goals   Increase Physical Activity Yes       Intervention Develop an individualized exercise prescription for aerobic and resistive training based on initial evaluation findings, risk stratification, comorbidities and participant's personal goals.;Provide advice, education, support and counseling about physical activity/exercise needs.       Expected Outcomes Long Term: Exercising regularly at least 3-5 days a week.;Long Term: Add in home exercise to make exercise part of routine and to increase amount of physical activity.;Short Term: Attend rehab on a regular basis to increase amount of physical activity.       Increase Strength and Stamina Yes       Intervention Develop an individualized exercise prescription for aerobic and resistive training based on initial evaluation findings, risk stratification, comorbidities and participant's personal  goals.;Provide advice, education, support and counseling about physical activity/exercise needs.       Expected Outcomes Short Term: Increase workloads from initial exercise prescription for resistance, speed, and METs.;Short Term: Perform resistance training exercises routinely during rehab and add in resistance training at home;Long Term: Improve cardiorespiratory fitness, muscular endurance and strength as measured by increased METs and functional capacity ( )  Able to understand and use rate of perceived exertion (RPE) scale Yes       Intervention Provide education and explanation on how to use RPE scale       Expected Outcomes Short Term: Able to use RPE daily in rehab to express subjective intensity level;Long Term:  Able to use RPE to guide intensity level when exercising independently       Able to understand and use Dyspnea scale Yes       Intervention Provide education and explanation on how to use Dyspnea scale       Expected Outcomes Short Term: Able to use Dyspnea scale daily in rehab to express subjective sense of shortness of breath during exertion;Long Term: Able to use Dyspnea scale to guide intensity level when exercising independently       Knowledge and understanding of Target Heart Rate Range (THRR) Yes       Intervention Provide education and explanation of THRR including how the numbers were predicted and where they are located for reference       Expected Outcomes Long Term: Able to use THRR to govern intensity when exercising independently;Short Term: Able to state/look up THRR;Short Term: Able to use daily as guideline for intensity in rehab       Able to check pulse independently Yes       Intervention Provide education and demonstration on how to check pulse in carotid and radial arteries.;Review the importance of being able to check your own pulse for safety during independent exercise       Expected Outcomes Short Term: Able to explain why pulse checking is  important during independent exercise;Long Term: Able to check pulse independently and accurately       Understanding of Exercise Prescription Yes       Intervention Provide education, explanation, and written materials on patient's individual exercise prescription       Expected Outcomes Short Term: Able to explain program exercise prescription;Long Term: Able to explain home exercise prescription to exercise independently                Exercise Goals Re-Evaluation :  Exercise Goals Re-Evaluation     Row Name 12/07/22 1146 12/23/22 1442 01/20/23 1433         Exercise Goal Re-Evaluation   Exercise Goals Review Increase Physical Activity;Increase Strength and Stamina;Able to understand and use rate of perceived exertion (RPE) scale;Knowledge and understanding of Target Heart Rate Range (THRR);Able to check pulse independently;Understanding of Exercise Prescription Increase Physical Activity;Increase Strength and Stamina;Understanding of Exercise Prescription Increase Physical Activity;Increase Strength and Stamina;Understanding of Exercise Prescription     Comments Reviewed RPE  and dyspnea scale, THR and program prescription with pt today.  Pt voiced understanding and was given a copy of goals to take home. Taiyo is new to the program. He has increased his NUstep T4 from level 2 to level 3. He maintained the TM at 2 MPH 0%elevation and maintianed the REXR at level 2. He continues to use 4 lb hand weights.  Will continue to monitor his progress. Kooper has increased several of his workloads with an RPE of 13.  TM 2/0to 2.2/0, T5Nustepup level 2 from level 1, T4Nustep from level 3 to level 4 and REXR from level 2 to level 4.  Will continue to monitor exercise progression with goal of increased stamina and strength.     Expected Outcomes Short: Use RPE daily to regulate intensity.  Long: Follow program prescription in THR. STG  COntinue to see exercise progression. LTG He has increased stamina and  strength from his exercise progression NGE:[id number] workloads as tolerated with goal of increased stamina and strength.   LTG: Continued exercise progression as tolerated during program and after discharge.              Discharge Exercise Prescription (Final Exercise Prescription Changes):  Exercise Prescription Changes - 01/20/23 1400       Response to Exercise   Blood Pressure (Admit) 110/58    Blood Pressure (Exercise) 138/70    Blood Pressure (Exit) 110/56    Heart Rate (Admit) 68 bpm    Heart Rate (Exercise) 122 bpm    Heart Rate (Exit) 84 bpm    Rating of Perceived Exertion (Exercise) 13    Duration Continue with 30 min of aerobic exercise without signs/symptoms of physical distress.    Intensity THRR unchanged      Progression   Progression Continue to progress workloads to maintain intensity without signs/symptoms of physical distress.    Average METs 2.71      Resistance Training   Training Prescription Yes    Weight 5lb    Reps 10-15      Interval Training   Interval Training No      Treadmill   MPH 2    Grade 0    Minutes 15    METs 2.53      NuStep   Level 4    Minutes 15    METs 2.6      REL-XR   Level 4    Minutes 15      T5 Nustep   Level 2    Minutes 15    METs 2.6      Oxygen   Maintain Oxygen Saturation 88% or higher             Nutrition:  Target Goals: Understanding of nutrition guidelines, daily intake of sodium 1500mg , cholesterol 200mg , calories 30% from fat and 7% or less from saturated fats, daily to have 5 or more servings of fruits and vegetables.  Education: All About Nutrition: -Group instruction provided by verbal, written material, interactive activities, discussions, models, and posters to present general guidelines for heart healthy nutrition including fat, fiber, MyPlate, the role of sodium in heart healthy nutrition, utilization of the nutrition label, and utilization of this knowledge for meal planning. Follow  up email sent as well. Written material given at graduation. Flowsheet Row Cardiac Rehab from 12/01/2022 in [facility] Cardiac and Pulmonary Rehab  Education need identified 12/01/22       Biometrics:  Pre Biometrics - 12/01/22 1455       Pre Biometrics   Height 5' 7.5" (1.715 m)    Weight 180 lb 3.2 oz (81.7 kg)    Waist Circumference 37.5 inches    Hip Circumference 39.5 inches    Waist to Hip Ratio 0.95 %    BMI (Calculated) 27.79    Single Leg Stand 12.7 seconds              Nutrition Therapy Plan and Nutrition Goals:  Nutrition Therapy & Goals - 12/01/22 1426       Intervention Plan   Intervention Prescribe, educate and counsel regarding individualized specific dietary modifications aiming towards targeted core components such as weight, hypertension, lipid management, diabetes, heart failure and other comorbidities.    Expected Outcomes Short Term Goal: Understand basic principles of dietary content, such as calories, fat, sodium, cholesterol and nutrients.;Short Term Goal:  A plan has been developed with personal nutrition goals set during dietitian appointment.;Long Term Goal: Adherence to prescribed nutrition plan.             Nutrition Assessments:  MEDIFICTS Score Key: ?70 Need to make dietary changes  40-70 Heart Healthy Diet ? 40 Therapeutic Level Cholesterol Diet  Flowsheet Row Cardiac Rehab from 12/01/2022 in [facility] Cardiac and Pulmonary Rehab  Picture Your Plate Total Score on Admission 63      Picture Your Plate Scores: <09 Unhealthy dietary pattern with much room for improvement. 41-50 Dietary pattern unlikely to meet recommendations for good health and room for improvement. 51-60 More healthful dietary pattern, with some room for improvement.  >60 Healthy dietary pattern, although there may be some specific behaviors that could be improved.    Nutrition Goals Re-Evaluation:  Nutrition Goals Re-Evaluation     Row Name 01/27/23 1115              Goals   Current Weight 188 lb (85.3 kg)       Nutrition Goal More protien options       Comment Meet with RD.       Expected Outcome Short: Make RD appointment. Long: adhere to a diet that pertains to him.                Nutrition Goals Discharge (Final Nutrition Goals Re-Evaluation):  Nutrition Goals Re-Evaluation - 01/27/23 1115       Goals   Current Weight 188 lb (85.3 kg)    Nutrition Goal More protien options    Comment Meet with RD.    Expected Outcome Short: Make RD appointment. Long: adhere to a diet that pertains to him.             Psychosocial: Target Goals: Acknowledge presence or absence of significant depression and/or stress, maximize coping skills, provide positive support system. Participant is able to verbalize types and ability to use techniques and skills needed for reducing stress and depression.   Education: Stress, Anxiety, and Depression - Group verbal and visual presentation to define topics covered.  Reviews how body is impacted by stress, anxiety, and depression.  Also discusses healthy ways to reduce stress and to treat/manage anxiety and depression.  Written material given at graduation.   Education: Sleep Hygiene -Provides group verbal and written instruction about how sleep can affect your health.  Define sleep hygiene, discuss sleep cycles and impact of sleep habits. Review good sleep hygiene tips.    Initial Review & Psychosocial Screening:  Initial Psych Review & Screening - 11/25/22 1010       Initial Review   Current issues with None Identified      Family Dynamics   Good Support System? Yes   adult son/daughter and wife     Barriers   Psychosocial barriers to participate in program There are no identifiable barriers or psychosocial needs.      Screening Interventions   Interventions Encouraged to exercise;To provide support and resources with identified psychosocial needs;Provide feedback about the scores to participant     Expected Outcomes Short Term goal: Utilizing psychosocial counselor, staff and physician to assist with identification of specific Stressors or current issues interfering with healing process. Setting desired goal for each stressor or current issue identified.;Long Term Goal: Stressors or current issues are controlled or eliminated.;Short Term goal: Identification and review with participant of any Quality of Life or Depression concerns found by scoring the questionnaire.;Long Term goal: The participant improves  quality of Life and PHQ9 Scores as seen by post scores and/or verbalization of changes             Quality of Life Scores:   Quality of Life - 12/01/22 1438       Quality of Life   Select Quality of Life      Quality of Life Scores   Health/Function Pre 16.64 %    Socioeconomic Pre 21.13 %    Psych/Spiritual Pre 15.29 %    Family Pre 24 %    GLOBAL Pre 18.15 %            Scores of 19 and below usually indicate a poorer quality of life in these areas.  A difference of  2-3 points is a clinically meaningful difference.  A difference of 2-3 points in the total score of the Quality of Life Index has been associated with significant improvement in overall quality of life, self-image, physical symptoms, and general health in studies assessing change in quality of life.  PHQ-9: Review Flowsheet  More data exists      12/01/2022 10/06/2022 09/28/2022 09/30/2021 09/16/2020  Depression screen PHQ 2/9  Decreased Interest 0 0 0 0 0  Down, Depressed, Hopeless 0 0 0 0 0  PHQ - 2 Score 0 0 0 0 0  Altered sleeping 0 - - - -  Tired, decreased energy 1 - - - -  Change in appetite 0 - - - -  Feeling bad or failure about yourself  0 - - - -  Trouble concentrating 1 - - - -  Moving slowly or fidgety/restless 0 - - - -  Suicidal thoughts 0 - - - -  PHQ-9 Score 2 - - - -  Difficult doing work/chores Not difficult at all - - - -    Details           Interpretation of Total Score  Total  Score Depression Severity:  1-4 = Minimal depression, 5-9 = Mild depression, 10-14 = Moderate depression, 15-19 = Moderately severe depression, 20-27 = Severe depression   Psychosocial Evaluation and Intervention:  Psychosocial Evaluation - 11/25/22 1031       Psychosocial Evaluation & Interventions   Interventions Encouraged to exercise with the program and follow exercise prescription    Comments Margarito has no barriers to attending the program. He lives with his wife and adult son. THey are his support along with his daughter.  He is undergoing chemo and is receiving a monthly dose. He continues to work Radio producer jobs. He wants to get back in shape he was in prior to his heart attack.  He is ready to get started    Expected Outcomes STG Attends all scheduled sessions, Progresses with his exercise,following the guidelines.  LTG He continues with his exercise progression and following health guidelines    Continue Psychosocial Services  Follow up required by staff             Psychosocial Re-Evaluation:  Psychosocial Re-Evaluation     Row Name 01/27/23 1117             Psychosocial Re-Evaluation   Current issues with Current Stress Concerns;Current Anxiety/Panic       Comments Shoua has done his first round of chemo in February. His wife is going through anxiety with his immunotherapy and is creating anxiety for him. His health issues are the brunt of is stressors and anxiety.       Expected Outcomes Short:  Continue to exercise regularly to support mental health and notify staff of any changes. Long: maintain mental health and well being through teaching of rehab or prescribed medications independently.       Interventions Encouraged to attend Cardiac Rehabilitation for the exercise       Continue Psychosocial Services  Follow up required by staff                Psychosocial Discharge (Final Psychosocial Re-Evaluation):  Psychosocial Re-Evaluation - 01/27/23 1117        Psychosocial Re-Evaluation   Current issues with Current Stress Concerns;Current Anxiety/Panic    Comments Bryson has done his first round of chemo in February. His wife is going through anxiety with his immunotherapy and is creating anxiety for him. His health issues are the brunt of is stressors and anxiety.    Expected Outcomes Short: Continue to exercise regularly to support mental health and notify staff of any changes. Long: maintain mental health and well being through teaching of rehab or prescribed medications independently.    Interventions Encouraged to attend Cardiac Rehabilitation for the exercise    Continue Psychosocial Services  Follow up required by staff             Vocational Rehabilitation: Provide vocational rehab assistance to qualifying candidates.   Vocational Rehab Evaluation & Intervention:  Vocational Rehab - 11/25/22 1013       Initial Vocational Rehab Evaluation & Intervention   Assessment shows need for Vocational Rehabilitation No      Vocational Rehab Re-Evaulation   Comments working part time  was self employed             Education: Education Goals: Education classes will be provided on a variety of topics geared toward better understanding of heart health and risk factor modification. Participant will state understanding/return demonstration of topics presented as noted by education test scores.  Learning Barriers/Preferences:   General Cardiac Education Topics:  AED/CPR: - Group verbal and written instruction with the use of models to demonstrate the basic use of the AED with the basic ABC's of resuscitation.   Anatomy and Cardiac Procedures: - Group verbal and visual presentation and models provide information about basic cardiac anatomy and function. Reviews the testing methods done to diagnose heart disease and the outcomes of the test results. Describes the treatment choices: Medical Management, Angioplasty, or Coronary Bypass  Surgery for treating various heart conditions including Myocardial Infarction, Angina, Valve Disease, and Cardiac Arrhythmias.  Written material given at graduation.   Medication Safety: - Group verbal and visual instruction to review commonly prescribed medications for heart and lung disease. Reviews the medication, class of the drug, and side effects. Includes the steps to properly store meds and maintain the prescription regimen.  Written material given at graduation.   Intimacy: - Group verbal instruction through game format to discuss how heart and lung disease can affect sexual intimacy. Written material given at graduation..   Know Your Numbers and Heart Failure: - Group verbal and visual instruction to discuss disease risk factors for cardiac and pulmonary disease and treatment options.  Reviews associated critical values for Overweight/Obesity, Hypertension, Cholesterol, and Diabetes.  Discusses basics of heart failure: signs/symptoms and treatments.  Introduces Heart Failure Zone chart for action plan for heart failure.  Written material given at graduation.   Infection Prevention: - Provides verbal and written material to individual with discussion of infection control including proper hand washing and proper equipment cleaning during exercise session. Flowsheet Row  Cardiac Rehab from 12/01/2022 in [facility] Cardiac and Pulmonary Rehab  Date 12/01/22  Educator NT  Instruction Review Code 1- Verbalizes Understanding       Falls Prevention: - Provides verbal and written material to individual with discussion of falls prevention and safety. Flowsheet Row Cardiac Rehab from 12/01/2022 in [facility] Cardiac and Pulmonary Rehab  Date 11/25/22  Educator SB  Instruction Review Code 1- Verbalizes Understanding       Other: -Provides group and verbal instruction on various topics (see comments)   Knowledge Questionnaire Score:  Knowledge Questionnaire Score - 12/01/22 1425        Knowledge Questionnaire Score   Pre Score 24/26             Core Components/Risk Factors/Patient Goals at Admission:  Personal Goals and Risk Factors at Admission - 11/25/22 1012       Core Components/Risk Factors/Patient Goals on Admission    Weight Management Yes    Intervention Weight Management: Provide education and appropriate resources to help participant work on and attain dietary goals.;Weight Management: Develop a combined nutrition and exercise program designed to reach desired caloric intake, while maintaining appropriate intake of nutrient and fiber, sodium and fats, and appropriate energy expenditure required for the weight goal.    Admit Weight 175 lb (79.4 kg)    Goal Weight: Long Term 175 lb (79.4 kg)    Expected Outcomes Short Term: Continue to assess and modify interventions until short term weight is achieved;Long Term: Adherence to nutrition and physical activity/exercise program aimed toward attainment of established weight goal;Weight Maintenance: Understanding of the daily nutrition guidelines, which includes 25-35% calories from fat, 7% or less cal from saturated fats, less than 200mg  cholesterol, less than 1.5gm of sodium, & 5 or more servings of fruits and vegetables daily    Lipids Yes    Intervention Provide education and support for participant on nutrition & aerobic/resistive exercise along with prescribed medications to achieve LDL 70mg , HDL >40mg .    Expected Outcomes Short Term: Participant states understanding of desired cholesterol values and is compliant with medications prescribed. Participant is following exercise prescription and nutrition guidelines.;Long Term: Cholesterol controlled with medications as prescribed, with individualized exercise RX and with personalized nutrition plan. Value goals: LDL < 70mg , HDL > 40 mg.             Education:Diabetes - Individual verbal and written instruction to review signs/symptoms of diabetes, desired  ranges of glucose level fasting, after meals and with exercise. Acknowledge that pre and post exercise glucose checks will be done for 3 sessions at entry of program.   Core Components/Risk Factors/Patient Goals Review:   Goals and Risk Factor Review     Row Name 01/27/23 1119             Core Components/Risk Factors/Patient Goals Review   Personal Goals Review Weight Management/Obesity;Hypertension       Review Caelum would like to lose some weight. His weight has gone up to 188 pounds since the start of the program. He would like to lost some weight. He wants to reach a weight goal of 180lb. His blood pressures have been good and is checking at home. His blood pressure was 134/58 today. Informed him to check BP at home to correlate with out readings. Patient verbalises understanding.       Expected Outcomes Short: Lose a few pounds in the next few weeks and check blood pressure at home. Long: maintain weight and blood pressure readings independently  Core Components/Risk Factors/Patient Goals at Discharge (Final Review):   Goals and Risk Factor Review - 01/27/23 1119       Core Components/Risk Factors/Patient Goals Review   Personal Goals Review Weight Management/Obesity;Hypertension    Review Obadiah would like to lose some weight. His weight has gone up to 188 pounds since the start of the program. He would like to lost some weight. He wants to reach a weight goal of 180lb. His blood pressures have been good and is checking at home. His blood pressure was 134/58 today. Informed him to check BP at home to correlate with out readings. Patient verbalises understanding.    Expected Outcomes Short: Lose a few pounds in the next few weeks and check blood pressure at home. Long: maintain weight and blood pressure readings independently             ITP Comments:  ITP Comments     Row Name 11/25/22 1028 12/01/22 1309 12/07/22 1144 12/09/22 0731 01/05/23 1359   ITP  Comments Virtual orientation call completed today. he has an appointment on Date: 12/01/2022  for EP eval and gym Orientation.  Documentation of diagnosis can be found in [facility] Date: 09/25/2022 . Completed and gym orientation. Initial ITP created and sent for review to Dr. Bethann Punches, Medical Director. First full day of exercise!  Patient was oriented to gym and equipment including functions, settings, policies, and procedures.  Patient's individual exercise prescription and treatment plan were reviewed.  All starting workloads were established based on the results of the 6 minute walk test done at initial orientation visit.  The plan for exercise progression was also introduced and progression will be customized based on patient's performance and goals. 30 Day review completed. Medical Director ITP review done, changes made as directed, and signed approval by Medical Director.   new to program 30 Day review completed. Medical Director ITP review done, changes made as directed, and signed approval by Medical Director.    Row Name 02/03/23 1121           ITP Comments 30 Day review completed. Medical Director ITP review done, changes made as directed, and signed approval by Medical Director.                Comments:

## 2023-02-08 ENCOUNTER — Encounter: Payer: Medicare Other | Admitting: *Deleted

## 2023-02-08 DIAGNOSIS — Z48812 Encounter for surgical aftercare following surgery on the circulatory system: Secondary | ICD-10-CM | POA: Diagnosis not present

## 2023-02-08 DIAGNOSIS — Z955 Presence of coronary angioplasty implant and graft: Secondary | ICD-10-CM | POA: Diagnosis not present

## 2023-02-08 DIAGNOSIS — I252 Old myocardial infarction: Secondary | ICD-10-CM | POA: Diagnosis not present

## 2023-02-08 DIAGNOSIS — I213 ST elevation (STEMI) myocardial infarction of unspecified site: Secondary | ICD-10-CM

## 2023-02-08 NOTE — Progress Notes (Signed)
Daily Session Note  Patient Details  Name: MALIKAH PILE MRN: [id number] Date of Birth: 1945-08-04 Referring Provider:   Flowsheet Row Cardiac Rehab from 12/01/2022 in [facility] Cardiac and Pulmonary Rehab  Referring Provider Dr. Lorine Bears, MD       Encounter Date: 02/08/2023  Check In:  Session Check In - 02/08/23 1115       Check-In   Supervising physician immediately available to respond to emergencies See telemetry face sheet for immediately available ER MD    Location [facility]-Cardiac & Pulmonary Rehab    Staff Present Lanny Hurst, RN, ADN;Meredith Jewel Baize, RN Mabeline Caras, BS, ACSM CEP, Exercise Physiologist;Other   Girtha Rm, MS   Virtual Visit No    Medication changes reported     No    Fall or balance concerns reported    No    Warm-up and Cool-down Performed on first and last piece of equipment    Resistance Training Performed Yes    VAD Patient? No    PAD/SET Patient? No      Pain Assessment   Currently in Pain? No/denies                Social History   Tobacco Use  Smoking Status Never  Smokeless Tobacco Never    Goals Met:  Independence with exercise equipment Exercise tolerated well No report of concerns or symptoms today Strength training completed today  Goals Unmet:  Not Applicable  Comments: Pt able to follow exercise prescription today without complaint.  Will continue to monitor for progression.    Dr. Bethann Punches is Medical Director for [facility] Cardiac Rehabilitation.  Dr. Vida Rigger is Medical Director for [facility] Pulmonary Rehabilitation.

## 2023-02-10 ENCOUNTER — Encounter: Payer: Medicare Other | Admitting: *Deleted

## 2023-02-10 DIAGNOSIS — Z955 Presence of coronary angioplasty implant and graft: Secondary | ICD-10-CM | POA: Diagnosis not present

## 2023-02-10 DIAGNOSIS — Z48812 Encounter for surgical aftercare following surgery on the circulatory system: Secondary | ICD-10-CM | POA: Diagnosis not present

## 2023-02-10 DIAGNOSIS — I213 ST elevation (STEMI) myocardial infarction of unspecified site: Secondary | ICD-10-CM

## 2023-02-10 DIAGNOSIS — I252 Old myocardial infarction: Secondary | ICD-10-CM | POA: Diagnosis not present

## 2023-02-10 NOTE — Progress Notes (Signed)
Daily Session Note  Patient Details  Name: Larry Kent MRN: [id number] Date of Birth: [birth year] Referring Provider:   Flowsheet Row Cardiac Rehab from 12/01/2022 in [facility] Cardiac and Pulmonary Rehab  Referring Provider Dr. Lorine Bears, MD       Encounter Date: 02/10/2023  Check In:  Session Check In - 02/10/23 1106       Check-In   Supervising physician immediately available to respond to emergencies See telemetry face sheet for immediately available ER MD    Location [facility]-Cardiac & Pulmonary Rehab    Staff Present Rory Percy, MS, Exercise Physiologist;Maxon Conetta BS, , Exercise Physiologist; Katrinka Blazing, RN, ADN    Virtual Visit No    Medication changes reported     No    Fall or balance concerns reported    No    Warm-up and Cool-down Performed on first and last piece of equipment    Resistance Training Performed Yes    VAD Patient? No    PAD/SET Patient? No      Pain Assessment   Currently in Pain? No/denies                Social History   Tobacco Use  Smoking Status Never  Smokeless Tobacco Never    Goals Met:  Independence with exercise equipment Exercise tolerated well No report of concerns or symptoms today Strength training completed today  Goals Unmet:  Not Applicable  Comments: Pt able to follow exercise prescription today without complaint.  Will continue to monitor for progression.    Dr. Bethann Punches is Medical Director for [facility] Cardiac Rehabilitation.  Dr. Vida Rigger is Medical Director for [facility] Pulmonary Rehabilitation.

## 2023-02-11 DIAGNOSIS — M1711 Unilateral primary osteoarthritis, right knee: Secondary | ICD-10-CM | POA: Diagnosis not present

## 2023-02-12 ENCOUNTER — Encounter: Payer: Medicare Other | Admitting: *Deleted

## 2023-02-12 DIAGNOSIS — I213 ST elevation (STEMI) myocardial infarction of unspecified site: Secondary | ICD-10-CM

## 2023-02-12 DIAGNOSIS — Z955 Presence of coronary angioplasty implant and graft: Secondary | ICD-10-CM

## 2023-02-12 DIAGNOSIS — I252 Old myocardial infarction: Secondary | ICD-10-CM | POA: Diagnosis not present

## 2023-02-12 DIAGNOSIS — Z48812 Encounter for surgical aftercare following surgery on the circulatory system: Secondary | ICD-10-CM | POA: Diagnosis not present

## 2023-02-12 NOTE — Progress Notes (Signed)
Daily Session Note  Patient Details  Name: Larry Kent MRN: [id number] Date of Birth: 1946-04-05 Referring Provider:   Flowsheet Row Cardiac Rehab from 12/01/2022 in [facility] Cardiac and Pulmonary Rehab  Referring Provider Dr. Lorine Bears, MD       Encounter Date: 02/12/2023  Check In:  Session Check In - 02/12/23 1136       Check-In   Supervising physician immediately available to respond to emergencies See telemetry face sheet for immediately available ER MD    Location [facility]-Cardiac & Pulmonary Rehab    Staff Present Cyndia Diver, RN, BSN, MA;Susanne Bice, RN, BSN, CCRP;Noah Tickle, BS, Exercise Physiologist    Virtual Visit No    Medication changes reported     No    Fall or balance concerns reported    No    Tobacco Cessation No Change    Warm-up and Cool-down Performed on first and last piece of equipment    Resistance Training Performed Yes    VAD Patient? No    PAD/SET Patient? No      Pain Assessment   Currently in Pain? No/denies                Social History   Tobacco Use  Smoking Status Never  Smokeless Tobacco Never    Goals Met:  Independence with exercise equipment Exercise tolerated well Strength training completed today  Goals Unmet:  Not Applicable  Comments: Pt able to follow exercise prescription today without complaint.  Will continue to monitor for progression.    Dr. Bethann Punches is Medical Director for [facility] Cardiac Rehabilitation.  Dr. Vida Rigger is Medical Director for [facility] Pulmonary Rehabilitation.

## 2023-02-15 ENCOUNTER — Other Ambulatory Visit: Payer: Self-pay | Admitting: Internal Medicine

## 2023-02-15 ENCOUNTER — Encounter: Payer: Medicare Other | Admitting: *Deleted

## 2023-02-15 ENCOUNTER — Encounter: Payer: Self-pay | Admitting: Internal Medicine

## 2023-02-15 DIAGNOSIS — C3490 Malignant neoplasm of unspecified part of unspecified bronchus or lung: Secondary | ICD-10-CM

## 2023-02-17 ENCOUNTER — Encounter: Payer: Medicare Other | Admitting: *Deleted

## 2023-02-17 DIAGNOSIS — Z48812 Encounter for surgical aftercare following surgery on the circulatory system: Secondary | ICD-10-CM | POA: Diagnosis not present

## 2023-02-17 DIAGNOSIS — Z955 Presence of coronary angioplasty implant and graft: Secondary | ICD-10-CM

## 2023-02-17 DIAGNOSIS — I252 Old myocardial infarction: Secondary | ICD-10-CM | POA: Diagnosis not present

## 2023-02-17 DIAGNOSIS — M1711 Unilateral primary osteoarthritis, right knee: Secondary | ICD-10-CM | POA: Diagnosis not present

## 2023-02-17 DIAGNOSIS — I213 ST elevation (STEMI) myocardial infarction of unspecified site: Secondary | ICD-10-CM

## 2023-02-17 NOTE — Progress Notes (Signed)
Daily Session Note  Patient Details  Name: Larry Kent MRN: [id number] Date of Birth: 21-Dec-1945 Referring Provider:   Flowsheet Row Cardiac Rehab from 12/01/2022 in [facility] Cardiac and Pulmonary Rehab  Referring Provider Dr. Lorine Bears, MD       Encounter Date: 02/17/2023  Check In:  Session Check In - 02/17/23 1119       Check-In   Supervising physician immediately available to respond to emergencies See telemetry face sheet for immediately available ER MD    Location [facility]-Cardiac & Pulmonary Rehab    Staff Present Rory Percy, MS, Exercise Physiologist;Meredith Jewel Baize, RN BSN;Maxon Conetta BS, , Exercise Physiologist;Maks Cavallero Katrinka Blazing, RN, ADN    Virtual Visit No    Medication changes reported     No    Fall or balance concerns reported    No    Warm-up and Cool-down Performed on first and last piece of equipment    Resistance Training Performed Yes    VAD Patient? No    PAD/SET Patient? No      Pain Assessment   Currently in Pain? No/denies                Social History   Tobacco Use  Smoking Status Never  Smokeless Tobacco Never    Goals Met:  Independence with exercise equipment Exercise tolerated well No report of concerns or symptoms today Strength training completed today  Goals Unmet:  Not Applicable  Comments: Pt able to follow exercise prescription today without complaint.  Will continue to monitor for progression.    Dr. Bethann Punches is Medical Director for [facility] Cardiac Rehabilitation.  Dr. Vida Rigger is Medical Director for [facility] Pulmonary Rehabilitation.

## 2023-02-19 ENCOUNTER — Encounter: Payer: Medicare Other | Admitting: *Deleted

## 2023-02-19 ENCOUNTER — Ambulatory Visit (HOSPITAL_COMMUNITY)
Admission: RE | Admit: 2023-02-19 | Discharge: 2023-02-19 | Disposition: A | Discharge status: Home / Self Care | Payer: Medicare Other | Source: Ambulatory Visit | Attending: Internal Medicine | Admitting: Internal Medicine

## 2023-02-19 DIAGNOSIS — I252 Old myocardial infarction: Secondary | ICD-10-CM | POA: Diagnosis not present

## 2023-02-19 DIAGNOSIS — Z955 Presence of coronary angioplasty implant and graft: Secondary | ICD-10-CM | POA: Diagnosis not present

## 2023-02-19 DIAGNOSIS — C3490 Malignant neoplasm of unspecified part of unspecified bronchus or lung: Secondary | ICD-10-CM | POA: Diagnosis not present

## 2023-02-19 DIAGNOSIS — R590 Localized enlarged lymph nodes: Secondary | ICD-10-CM | POA: Diagnosis not present

## 2023-02-19 DIAGNOSIS — I213 ST elevation (STEMI) myocardial infarction of unspecified site: Secondary | ICD-10-CM

## 2023-02-19 DIAGNOSIS — I700 Atherosclerosis of aorta: Secondary | ICD-10-CM | POA: Diagnosis not present

## 2023-02-19 DIAGNOSIS — C7951 Secondary malignant neoplasm of bone: Secondary | ICD-10-CM | POA: Diagnosis not present

## 2023-02-19 DIAGNOSIS — Z48812 Encounter for surgical aftercare following surgery on the circulatory system: Secondary | ICD-10-CM | POA: Diagnosis not present

## 2023-02-19 MED ORDER — IOHEXOL 300 MG/ML  SOLN
100.0000 mL | Freq: Once | INTRAMUSCULAR | Status: AC | PRN
  Administered 2023-02-19: 100 mL via INTRAVENOUS

## 2023-02-19 NOTE — Progress Notes (Signed)
Daily Session Note  Patient Details  Name: Larry Kent MRN: [id number] Date of Birth: 1945/07/24 Referring Provider:   Flowsheet Row Cardiac Rehab from 12/01/2022 in [place] Cardiac and Pulmonary Rehab  Referring Provider Dr. Lorine Bears, MD       Encounter Date: 02/19/2023  Check In:  Session Check In - 02/19/23 1145       Check-In   Supervising physician immediately available to respond to emergencies See telemetry face sheet for immediately available ER MD    Location [facility]-Cardiac & Pulmonary Rehab    Staff Present Cora Collum, RN, BSN, CCRP;Noah Tickle, BS, Exercise Physiologist;Joseph Naplate, [place]    Virtual Visit No    Medication changes reported     No    Fall or balance concerns reported    No    Warm-up and Cool-down Performed on first and last piece of equipment    Resistance Training Performed Yes    VAD Patient? No    PAD/SET Patient? No      Pain Assessment   Currently in Pain? No/denies                Social History   Tobacco Use  Smoking Status Never  Smokeless Tobacco Never    Goals Met:  Independence with exercise equipment Exercise tolerated well No report of concerns or symptoms today  Goals Unmet:  Not Applicable  Comments: Pt able to follow exercise prescription today without complaint.  Will continue to monitor for progression.    Dr. Bethann Punches is Medical Director for [facility] Cardiac Rehabilitation.  Dr. Vida Rigger is Medical Director for [facility] Pulmonary Rehabilitation.

## 2023-02-22 ENCOUNTER — Encounter: Payer: Medicare Other | Admitting: *Deleted

## 2023-02-23 ENCOUNTER — Other Ambulatory Visit: Payer: Self-pay | Admitting: *Deleted

## 2023-02-23 ENCOUNTER — Inpatient Hospital Stay: Payer: Medicare Other | Attending: Nurse Practitioner

## 2023-02-23 ENCOUNTER — Inpatient Hospital Stay (HOSPITAL_BASED_OUTPATIENT_CLINIC_OR_DEPARTMENT_OTHER): Payer: Medicare Other

## 2023-02-23 ENCOUNTER — Inpatient Hospital Stay: Payer: Medicare Other | Admitting: Internal Medicine

## 2023-02-23 VITALS — BP 138/72 | HR 68 | Temp 98.2°F | Resp 18

## 2023-02-23 DIAGNOSIS — C7A1 Malignant poorly differentiated neuroendocrine tumors: Secondary | ICD-10-CM | POA: Diagnosis not present

## 2023-02-23 DIAGNOSIS — Z5111 Encounter for antineoplastic chemotherapy: Secondary | ICD-10-CM | POA: Diagnosis not present

## 2023-02-23 DIAGNOSIS — C61 Malignant neoplasm of prostate: Secondary | ICD-10-CM | POA: Diagnosis present

## 2023-02-23 DIAGNOSIS — C7951 Secondary malignant neoplasm of bone: Secondary | ICD-10-CM | POA: Insufficient documentation

## 2023-02-23 DIAGNOSIS — C3490 Malignant neoplasm of unspecified part of unspecified bronchus or lung: Secondary | ICD-10-CM

## 2023-02-23 DIAGNOSIS — Z79899 Other long term (current) drug therapy: Secondary | ICD-10-CM | POA: Diagnosis not present

## 2023-02-23 DIAGNOSIS — Z960 Presence of urogenital implants: Secondary | ICD-10-CM

## 2023-02-23 LAB — CMP (CANCER CENTER ONLY)
ALT: 46 U/L — ABNORMAL HIGH (ref 0–44)
AST: 37 U/L (ref 15–41)
Albumin: 3.9 g/dL (ref 3.5–5.0)
Alkaline Phosphatase: 113 U/L (ref 38–126)
Anion gap: 6 (ref 5–15)
BUN: 23 mg/dL (ref 8–23)
CO2: 26 mmol/L (ref 22–32)
Calcium: 9.2 mg/dL (ref 8.9–10.3)
Chloride: 106 mmol/L (ref 98–111)
Creatinine: 0.91 mg/dL (ref 0.61–1.24)
GFR, Estimated: >60 mL/min (ref >60)
Glucose, Bld: 162 mg/dL — ABNORMAL HIGH (ref 70–99)
Potassium: 3.8 mmol/L (ref 3.5–5.1)
Sodium: 138 mmol/L (ref 135–145)
Total Bilirubin: 0.7 mg/dL (ref 0.3–1.2)
Total Protein: 6.8 g/dL (ref 6.5–8.1)

## 2023-02-23 LAB — CBC WITH DIFFERENTIAL (CANCER CENTER ONLY)
Abs Immature Granulocytes: 0.03 10*3/uL (ref 0.00–0.07)
Basophils Absolute: 0.1 10*3/uL (ref 0.0–0.1)
Basophils Relative: 1 %
Eosinophils Absolute: 0.1 10*3/uL (ref 0.0–0.5)
Eosinophils Relative: 1 %
HCT: 36.1 % — ABNORMAL LOW (ref 39.0–52.0)
Hemoglobin: 12.7 g/dL — ABNORMAL LOW (ref 13.0–17.0)
Immature Granulocytes: 0 %
Lymphocytes Relative: 15 %
Lymphs Abs: 1.1 10*3/uL (ref 0.7–4.0)
MCH: 34.7 pg — ABNORMAL HIGH (ref 26.0–34.0)
MCHC: 35.2 g/dL (ref 30.0–36.0)
MCV: 98.6 fL (ref 80.0–100.0)
Monocytes Absolute: 0.7 10*3/uL (ref 0.1–1.0)
Monocytes Relative: 10 %
Neutro Abs: 5.1 10*3/uL (ref 1.7–7.7)
Neutrophils Relative %: 73 %
Platelet Count: 153 10*3/uL (ref 150–400)
RBC: 3.66 MIL/uL — ABNORMAL LOW (ref 4.22–5.81)
RDW: 13.2 % (ref 11.5–15.5)
WBC Count: 7.1 10*3/uL (ref 4.0–10.5)
nRBC: 0 % (ref 0.0–0.2)

## 2023-02-23 LAB — URINALYSIS, COMPLETE (UACMP) WITH MICROSCOPIC
Bilirubin Urine: NEGATIVE
Glucose, UA: NEGATIVE mg/dL
Hgb urine dipstick: NEGATIVE
Ketones, ur: NEGATIVE mg/dL
Leukocytes,Ua: NEGATIVE
Nitrite: NEGATIVE
Protein, ur: NEGATIVE mg/dL
Specific Gravity, Urine: 1.017 (ref 1.005–1.030)
pH: 6 (ref 5.0–8.0)

## 2023-02-23 LAB — SAMPLE TO BLOOD BANK

## 2023-02-23 MED ORDER — SODIUM CHLORIDE 0.9 % IV SOLN: Freq: Once | INTRAVENOUS | Status: AC

## 2023-02-23 MED ORDER — SODIUM CHLORIDE 0.9 % IV SOLN
1500.0000 mg | Freq: Once | INTRAVENOUS | Status: AC
  Administered 2023-02-23: 1500 mg via INTRAVENOUS
  Filled 2023-02-23: qty 30

## 2023-02-23 MED ORDER — SODIUM CHLORIDE 0.9% FLUSH
10.0000 mL | Freq: Once | INTRAVENOUS | Status: AC
  Administered 2023-02-23: 10 mL

## 2023-02-23 NOTE — Patient Instructions (Signed)
[facility]  Discharge Instructions: Thank you for choosing [facility] to provide your oncology and hematology care.   If you have a lab appointment with the Cancer Center, please go directly to the Cancer Center and check in at the registration area.   Wear comfortable clothing and clothing appropriate for easy access to any Portacath or PICC line.   We strive to give you quality time with your provider. You may need to reschedule your appointment if you arrive late (15 or more minutes).  Arriving late affects you and other patients whose appointments are after yours.  Also, if you miss three or more appointments without notifying the office, you may be dismissed from the clinic at the provider's discretion.      For prescription refill requests, have your pharmacy contact our office and allow 72 hours for refills to be completed.    Today you received the following chemotherapy and/or immunotherapy agents: Durvalumab      To help prevent nausea and vomiting after your treatment, we encourage you to take your nausea medication as directed.  BELOW ARE SYMPTOMS THAT SHOULD BE REPORTED IMMEDIATELY: *FEVER GREATER THAN 100.4 F (38 C) OR HIGHER *CHILLS OR SWEATING *NAUSEA AND VOMITING THAT IS NOT CONTROLLED WITH YOUR NAUSEA MEDICATION *UNUSUAL SHORTNESS OF BREATH *UNUSUAL BRUISING OR BLEEDING *URINARY PROBLEMS (pain or burning when urinating, or frequent urination) *BOWEL PROBLEMS (unusual diarrhea, constipation, pain near the anus) TENDERNESS IN MOUTH AND THROAT WITH OR WITHOUT PRESENCE OF ULCERS (sore throat, sores in mouth, or a toothache) UNUSUAL RASH, SWELLING OR PAIN  UNUSUAL VAGINAL DISCHARGE OR ITCHING   Items with * indicate a potential emergency and should be followed up as soon as possible or go to the Emergency Department if any problems should occur.  Please show the CHEMOTHERAPY ALERT CARD or IMMUNOTHERAPY ALERT CARD at  check-in to the Emergency Department and triage nurse.  Should you have questions after your visit or need to cancel or reschedule your appointment, please contact [facility]  Dept: [phone]  and follow the prompts.  Office hours are 8:00 a.m. to 4:30 p.m. Monday - Friday. Please note that voicemails left after 4:00 p.m. may not be returned until the following business day.  We are closed weekends and major holidays. You have access to a nurse at all times for urgent questions. Please call the main number to the clinic Dept: [phone] and follow the prompts.   For any non-urgent questions, you may also contact your provider using MyChart. We now offer e-Visits for anyone 18 and older to request care online for non-urgent symptoms. For details visit [email or web address].   Also download the MyChart app! Go to the app store, search "MyChart", open the app, select [facility], and log in with your MyChart username and password.   

## 2023-02-23 NOTE — Progress Notes (Signed)
[facility] [phone]   [phone]  OFFICE PROGRESS NOTE  Larry Boyden, MD [address] 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 Mena Goes   PRIOR THERAPY: None   CURRENT THERAPY:  Palliative systemic chemotherapy with carboplatin for AUC of 5 and Imfinzi 1500 Mg IV on day 1 as well as etoposide 100 Mg/M2 on days 1, 2 and 3 with Neulasta support on day 5. Status post 5 cycles. First dose on 08/11/22.  Starting from cycle #2 his carboplatin will be reduced to AUC of 4 and 2 etoposide 80 Mg/M2 secondary to intolerance and cycle #1.  Starting from cycle #5 he will be on maintenance treatment with single agent Imfinzi 1500 Mg IV every 4 weeks.  INTERVAL HISTORY: Larry Kent 77 y.o. male returns to the clinic today for follow-up visit.  The patient is feeling fine today with no concerning complaints.  He denied having any current chest pain, shortness of breath, cough or hemoptysis.  He denied having any fever or chills.  He has no nausea, vomiting, diarrhea or constipation.  He has no headache or visual changes.  He denied having any recent weight loss or night sweats.  He has occasional bad odor of his urine.  He is here today for evaluation with repeat CT scan of the chest, abdomen and pelvis for restaging of his disease.   MEDICAL HISTORY: Past Medical History:  Diagnosis Date   Allergy    Arthritis    both knees    BCC (basal cell carcinoma), arm, right 09/2019   MOHS ([place])    BCC (basal cell carcinoma), face 2014   L preauricular s/p MOHS   BCC (basal cell carcinoma), face 02/2018   L upper lip   BPH (benign prostatic hypertrophy)    Dr. Mena Goes @ [facility]   Carotid stenosis 05/2015   RICA 40-59%, LICA 1-39%, L vertebral occlusion, rpt 1 yr   Cataract    removed both eyes    FHx: colon cancer    FHx: prostate cancer    Heart murmur    mild aortic stenosis    History of kidney stones    Hyperlipidemia    borderline- on rosuvastatin now normal    Occlusion of right vertebral artery 05/2015    ALLERGIES:  has No Known Allergies.  MEDICATIONS:  Current Outpatient Medications  Medication Sig Dispense Refill   abiraterone acetate (ZYTIGA) 250 MG tablet Take 1,000 mg by mouth daily.     acetaminophen (TYLENOL) 500 MG tablet Take 1,000 mg by mouth every 6 (six) hours as needed for mild pain or headache.     Calcium Carb-Cholecalciferol (RA CALCIUM PLUS VITAMIN D) 600-10 MG-MCG TABS Take 600 mg by mouth daily.     fluticasone (FLONASE) 50 MCG/ACT nasal spray Place 1 spray into both nostrils daily as needed for allergies or rhinitis.     lidocaine-prilocaine (EMLA) cream Apply to the Port-A-Cath site 30-60 minutes before chemotherapy treatment (Patient taking differently: Apply 1 Application topically See admin instructions. Apply to the Port-A-Cath site 30-60 minutes before chemotherapy treatment) 30  g 0   metoprolol tartrate (LOPRESSOR) 25 MG tablet Take 0.5 tablets (12.5 mg total) by mouth 2 (two) times daily. 30 tablet 11   Multiple Vitamin (MULTIVITAMIN) tablet Take 1 tablet by mouth daily with breakfast.     predniSONE (DELTASONE) 5 MG tablet Take 5 mg by mouth daily.     prochlorperazine (COMPAZINE) 10 MG tablet Take 1 tablet (10 mg total) by mouth every 6 (six) hours as needed for nausea or vomiting. 30 tablet 0   rosuvastatin (CRESTOR) 20 MG tablet Take 1 tablet (20 mg total) by mouth daily. 90 tablet 4   tamsulosin (FLOMAX) 0.4 MG CAPS capsule Take  0.4 mg by mouth 2 (two) times daily.     ticagrelor (BRILINTA) 90 MG TABS tablet Take 1 tablet (90 mg total) by mouth 2 (two) times daily. 60 tablet 11   Zinc 25 MG TABS Take 25 mg by mouth daily.     No current facility-administered medications for this visit.    SURGICAL HISTORY:  Past Surgical History:  Procedure Laterality Date   BASAL CELL CARCINOMA EXCISION  05/2018   lip, 2020 x3  basal cells removed    CATARACT EXTRACTION  2008   Left   CATARACT EXTRACTION  2013   Right (Eppes)   COLONOSCOPY  ~2010   medium int hemorrhoids, o/w WNL, rpt 5 yrs given fmhx (Dr. Randa Evens)   COLONOSCOPY  08/2019   TAs, diverticulosis, rpt 3 yrs (Armbruster)   CORONARY/GRAFT ACUTE MI REVASCULARIZATION N/A 09/07/2022   Procedure: Coronary/Graft Acute MI Revascularization;  Surgeon: Orbie Pyo, MD;  [facility]: [facility];  Service: Cardiovascular;  Laterality: N/A;   ENDARTERECTOMY Right 12/01/2019   Procedure: RIGHT ENDARTERECTOMY CAROTID;  Surgeon: Cephus Shelling, MD;  Location: [facility] OR;  Service: Vascular;  Laterality: Right;   exercise treadmill  11/2005   WNL Eldridge Dace)   FINE NEEDLE ASPIRATION  07/28/2022   Procedure: FINE NEEDLE ASPIRATION (FNA) LINEAR;  Surgeon: Josephine Igo, DO;  Location: [facility];  Service: Pulmonary;;   HERNIA REPAIR  2008   Right   IR IMAGING GUIDED PORT INSERTION  08/11/2022   IR IMAGING GUIDED PORT INSERTION  10/08/2022   IR REMOVAL TUN ACCESS W/ PORT W/O FL MOD SED  09/10/2022   LEFT HEART CATH AND CORONARY ANGIOGRAPHY N/A 09/07/2022   Procedure: LEFT HEART CATH AND CORONARY ANGIOGRAPHY;  Surgeon: Orbie Pyo, MD;  Location: [facility];  Service: Cardiovascular;  Laterality: N/A;   MOHS SURGERY Left spring 2014   basal cell face   PATCH ANGIOPLASTY Right 12/01/2019   Procedure: PATCH ANGIOPLASTY USING Livia Snellen BIOLOGIC PATCH;  Surgeon: Cephus Shelling, MD;  Location: [facility] OR;  Service: Vascular;  Laterality: Right;   Testicular  Biopsy  2003   benign, varicocele   US ECHOCARDIOGRAPHY  11/2005   aortic sclerosis, EF 55-60%, diastolic dysfunction   VIDEO BRONCHOSCOPY WITH ENDOBRONCHIAL ULTRASOUND Bilateral 07/28/2022   Procedure: VIDEO BRONCHOSCOPY WITH ENDOBRONCHIAL ULTRASOUND;  Surgeon: Josephine Igo, DO;  Location: [facility];  Service: Pulmonary;  Laterality: Bilateral;    REVIEW OF SYSTEMS:  Constitutional: negative Eyes: negative Ears, nose, mouth, throat, and face: negative Respiratory: negative Cardiovascular: negative Gastrointestinal: negative Genitourinary:negative Integument/breast: negative Hematologic/lymphatic: negative Musculoskeletal:negative Neurological: negative Behavioral/Psych: negative Endocrine: negative Allergic/Immunologic: negative   PHYSICAL EXAMINATION: General appearance: alert, cooperative, fatigued, and no distress Head: Normocephalic, without obvious abnormality, atraumatic Neck: no adenopathy, no JVD, supple, symmetrical, trachea midline, and thyroid not enlarged, symmetric, no tenderness/mass/nodules  Lymph nodes: Cervical, supraclavicular, and axillary nodes normal. Resp: clear to auscultation bilaterally Back: symmetric, no curvature. ROM normal. No CVA tenderness. Cardio: regular rate and rhythm, S1, S2 normal, no murmur, click, rub or gallop GI: soft, non-tender; bowel sounds normal; no masses,  no organomegaly Extremities: extremities normal, atraumatic, no cyanosis or edema Neurologic: Alert and oriented X 3, normal strength and tone. Normal symmetric reflexes. Normal coordination and gait  ECOG PERFORMANCE STATUS: 1 - Symptomatic but completely ambulatory  Blood pressure 134/76, pulse 60, temperature 97.8 F (36.6 C), temperature source Oral, resp. rate 17, height 5\' 6"  (1.676 m), weight 187 lb 4.8 oz (85 kg), SpO2 98%.  LABORATORY DATA: Lab Results  Component Value Date   WBC 5.8 01/26/2023   HGB 12.1 (L) 01/26/2023   HCT 35.0 (L) 01/26/2023   MCV 99.4  01/26/2023   PLT 157 01/26/2023      Chemistry      Component Value Date/Time   NA 139 01/26/2023 0813   K 3.7 01/26/2023 0813   CL 107 01/26/2023 0813   CO2 26 01/26/2023 0813   BUN 19 01/26/2023 0813   CREATININE 0.88 01/26/2023 0813      Component Value Date/Time   CALCIUM 9.4 01/26/2023 0813   ALKPHOS 94 01/26/2023 0813   AST 20 01/26/2023 0813   ALT 19 01/26/2023 0813   BILITOT 0.6 01/26/2023 0813       RADIOGRAPHIC STUDIES: CT Chest W Contrast  Result Date: 02/22/2023 CLINICAL DATA:  Restaging small cell lung cancer. * Tracking Code: BO * EXAM: CT CHEST, ABDOMEN, AND PELVIS WITH CONTRAST TECHNIQUE: Multidetector CT imaging of the chest, abdomen and pelvis was performed following the standard protocol during bolus administration of intravenous contrast. RADIATION DOSE REDUCTION: This exam was performed according to the departmental dose-optimization program which includes automated exposure control, adjustment of the mA and/or kV according to patient size and/or use of iterative reconstruction technique. CONTRAST:  OMNIPAQUE IOHEXOL 300 MG/ML  SOLN COMPARISON:  Cardiac PET-CT 12/15/2022. CT of the chest, abdomen and pelvis 11/02/2022. FINDINGS: CT CHEST FINDINGS Cardiovascular: No acute vascular findings are demonstrated. Left IJ central venous catheter projects to the superior cavoatrial junction. There is diffuse atherosclerosis of the aorta, great vessels and coronary arteries. There are prominent calcifications of the aortic valve. The heart size is normal. There is no pericardial effusion. Mediastinum/Nodes: Previously demonstrated prominent subcarinal lymph node has decreased in size, now measuring 10 mm on image 30/2 (previously 14 mm). No other enlarged mediastinal, hilar or axillary lymph nodes. The thyroid gland, trachea and esophagus demonstrate no significant findings. Lungs/Pleura: No pleural effusion or pneumothorax. The previously demonstrated bilateral pulmonary  nodules have decreased in size and are less well-defined compared with the prior study. The largest remaining nodules include a right middle lobe nodule measuring approximately 1.6 x 1.1 cm on image 94/4 (previously 1.7 x 1.3 cm, remeasured), and a left upper lobe lesion measuring 1.0 x 0.8 cm on image 67/4 (previously 1.3 x 1.1 cm). No new or enlarging nodules are identified. Underlying mild centrilobular emphysema. Musculoskeletal/Chest wall: Unchanged appearance of sclerotic metastasis within the right aspect of the T12 vertebral body, consistent with a treated lesion. No new lesions or pathologic fractures are identified. CT ABDOMEN AND PELVIS FINDINGS Hepatobiliary: Stable mild contour irregularity of the liver. No focal lesion or abnormal enhancement identified. No evidence of gallstones, gallbladder wall thickening or biliary dilatation. Pancreas: Fatty replacement within the pancreatic head and body. No pancreatic ductal dilatation, mass lesion  or surrounding inflammation. Spleen: Normal in size without focal abnormality. Adrenals/Urinary Tract: Both adrenal glands appear normal. No evidence of urinary tract calculus, suspicious renal lesion or hydronephrosis. There are stable small cysts within the left kidney for which no specific follow-up imaging is recommended. There is progressive asymmetric bladder wall thickening on the left, measuring up to 10 mm in thickness. Previously, there was more symmetric and milder diffuse bladder wall thickening. No surrounding inflammation. Stomach/Bowel: No enteric contrast administered. The stomach appears unremarkable for its degree of distension. No evidence of bowel wall thickening, distention or surrounding inflammatory change. The appendix appears normal. Stable diverticular changes within the distal colon. Vascular/Lymphatic: There are no enlarged abdominal or pelvic lymph nodes. Aortic and branch vessel atherosclerosis without evidence of aneurysm or large vessel  occlusion. Retroaortic left renal vein noted. Reproductive: The prostate gland and seminal vesicles appear unremarkable. Other: No evidence of abdominal wall mass or hernia. No ascites, peritoneal nodularity or pneumoperitoneum. Stable foci of probable fat necrosis in the left pelvis. Musculoskeletal: Multifocal sclerotic metastases, most prominent involving the L2 vertebral body and bilateral pubic rami, are unchanged. No new lesions or pathologic fractures identified. There is degenerative disc disease at L5-S1 with chronic asymmetric right foraminal narrowing. Unless specific follow-up recommendations are mentioned in the findings or impression sections, no imaging follow-up of any mentioned incidental findings is recommended. IMPRESSION: 1. Interval improvement in previously demonstrated bilateral pulmonary nodules and subcarinal adenopathy consistent with response to therapy. 2. No evidence of progressive metastatic disease within the abdomen or pelvis. 3. Stable sclerotic osseous metastases. 4. Progressive asymmetric bladder wall thickening on the left. This could be inflammatory or neoplastic. Correlate with urine analysis and cytology and consider cystoscopy for further evaluation. 5. Aortic Atherosclerosis (ICD10-I70.0) and Emphysema (ICD10-J43.9). Electronically Signed   By: Carey Bullocks M.D.   On: 02/22/2023 13:00   CT ABDOMEN PELVIS W CONTRAST  Result Date: 02/22/2023 CLINICAL DATA:  Restaging small cell lung cancer. * Tracking Code: BO * EXAM: CT CHEST, ABDOMEN, AND PELVIS WITH CONTRAST TECHNIQUE: Multidetector CT imaging of the chest, abdomen and pelvis was performed following the standard protocol during bolus administration of intravenous contrast. RADIATION DOSE REDUCTION: This exam was performed according to the departmental dose-optimization program which includes automated exposure control, adjustment of the mA and/or kV according to patient size and/or use of iterative reconstruction  technique. CONTRAST:  OMNIPAQUE IOHEXOL 300 MG/ML  SOLN COMPARISON:  Cardiac PET-CT 12/15/2022. CT of the chest, abdomen and pelvis 11/02/2022. FINDINGS: CT CHEST FINDINGS Cardiovascular: No acute vascular findings are demonstrated. Left IJ central venous catheter projects to the superior cavoatrial junction. There is diffuse atherosclerosis of the aorta, great vessels and coronary arteries. There are prominent calcifications of the aortic valve. The heart size is normal. There is no pericardial effusion. Mediastinum/Nodes: Previously demonstrated prominent subcarinal lymph node has decreased in size, now measuring 10 mm on image 30/2 (previously 14 mm). No other enlarged mediastinal, hilar or axillary lymph nodes. The thyroid gland, trachea and esophagus demonstrate no significant findings. Lungs/Pleura: No pleural effusion or pneumothorax. The previously demonstrated bilateral pulmonary nodules have decreased in size and are less well-defined compared with the prior study. The largest remaining nodules include a right middle lobe nodule measuring approximately 1.6 x 1.1 cm on image 94/4 (previously 1.7 x 1.3 cm, remeasured), and a left upper lobe lesion measuring 1.0 x 0.8 cm on image 67/4 (previously 1.3 x 1.1 cm). No new or enlarging nodules are identified. Underlying mild centrilobular emphysema.  Musculoskeletal/Chest wall: Unchanged appearance of sclerotic metastasis within the right aspect of the T12 vertebral body, consistent with a treated lesion. No new lesions or pathologic fractures are identified. CT ABDOMEN AND PELVIS FINDINGS Hepatobiliary: Stable mild contour irregularity of the liver. No focal lesion or abnormal enhancement identified. No evidence of gallstones, gallbladder wall thickening or biliary dilatation. Pancreas: Fatty replacement within the pancreatic head and body. No pancreatic ductal dilatation, mass lesion or surrounding inflammation. Spleen: Normal in size without focal  abnormality. Adrenals/Urinary Tract: Both adrenal glands appear normal. No evidence of urinary tract calculus, suspicious renal lesion or hydronephrosis. There are stable small cysts within the left kidney for which no specific follow-up imaging is recommended. There is progressive asymmetric bladder wall thickening on the left, measuring up to 10 mm in thickness. Previously, there was more symmetric and milder diffuse bladder wall thickening. No surrounding inflammation. Stomach/Bowel: No enteric contrast administered. The stomach appears unremarkable for its degree of distension. No evidence of bowel wall thickening, distention or surrounding inflammatory change. The appendix appears normal. Stable diverticular changes within the distal colon. Vascular/Lymphatic: There are no enlarged abdominal or pelvic lymph nodes. Aortic and branch vessel atherosclerosis without evidence of aneurysm or large vessel occlusion. Retroaortic left renal vein noted. Reproductive: The prostate gland and seminal vesicles appear unremarkable. Other: No evidence of abdominal wall mass or hernia. No ascites, peritoneal nodularity or pneumoperitoneum. Stable foci of probable fat necrosis in the left pelvis. Musculoskeletal: Multifocal sclerotic metastases, most prominent involving the L2 vertebral body and bilateral pubic rami, are unchanged. No new lesions or pathologic fractures identified. There is degenerative disc disease at L5-S1 with chronic asymmetric right foraminal narrowing. Unless specific follow-up recommendations are mentioned in the findings or impression sections, no imaging follow-up of any mentioned incidental findings is recommended. IMPRESSION: 1. Interval improvement in previously demonstrated bilateral pulmonary nodules and subcarinal adenopathy consistent with response to therapy. 2. No evidence of progressive metastatic disease within the abdomen or pelvis. 3. Stable sclerotic osseous metastases. 4. Progressive  asymmetric bladder wall thickening on the left. This could be inflammatory or neoplastic. Correlate with urine analysis and cytology and consider cystoscopy for further evaluation. 5. Aortic Atherosclerosis (ICD10-I70.0) and Emphysema (ICD10-J43.9). Electronically Signed   By: Carey Bullocks M.D.   On: 02/22/2023 13:00    ASSESSMENT AND PLAN: This is a very pleasant 77 years old white male with: 1) Metastatic poorly differentiated carcinoma, neuroendocrine carcinoma of suspicious prostate primary versus primary lung cancer presented with innumerable and bilateral pulmonary nodules in addition to right hilar and mediastinal lymphadenopathy and metastatic disease to several areas of the bone in addition to hypermetabolic activity in the prostate gland diagnosed in January 2024  The patient is currently undergoing systemic chemotherapy with carboplatin for AUC of 5 on day 1, Imfinzi 1500 Mg IV on day 1, etoposide 100 Mg/M2 on days 1, 2 and 3 with Neulasta support every 3 weeks status post 6 cycles.  Starting from cycle #5 the patient will be on maintenance treatment with Imfinzi 1500 Mg IV every 4 weeks.   The patient has been tolerating his treatment well with no concerning adverse effects. He had repeat CT scan of the chest, abdomen and pelvis performed recently.  I personally and independently reviewed the scan and discussed the result with the patient today. His scan showed no concerning findings for disease progression. I recommended for him to continue his current treatment with immunotherapy and he will proceed with cycle #7 today. He will come back  for follow-up visit in 4 weeks for evaluation before the next cycle of his treatment. 2) prostate adenocarcinoma diagnosed in February 2024 currently managed by Dr. Mena Goes. 3) for the bladder wall thickening seen on the scan, I will order UA and culture sensitivity today.  The patient will discuss with Dr. Mena Goes evaluation of his bladder wall  thickening on the upcoming visit.  He was advised to call immediately if he has any other concerning symptoms in the interval. The patient voices understanding of current disease status and treatment options and is in agreement with the current care plan.  All questions were answered. The patient knows to call the clinic with any problems, questions or concerns. We can certainly see the patient much sooner if necessary.  The total time spent in the appointment was 30 minutes.  Disclaimer: This note was dictated with voice recognition software. Similar sounding words can inadvertently be transcribed and may not be corrected upon review.

## 2023-02-24 ENCOUNTER — Other Ambulatory Visit: Payer: Self-pay | Admitting: Internal Medicine

## 2023-02-24 ENCOUNTER — Encounter: Payer: Medicare Other | Admitting: *Deleted

## 2023-02-24 DIAGNOSIS — I213 ST elevation (STEMI) myocardial infarction of unspecified site: Secondary | ICD-10-CM

## 2023-02-24 DIAGNOSIS — Z955 Presence of coronary angioplasty implant and graft: Secondary | ICD-10-CM

## 2023-02-24 DIAGNOSIS — Z48812 Encounter for surgical aftercare following surgery on the circulatory system: Secondary | ICD-10-CM | POA: Diagnosis not present

## 2023-02-24 DIAGNOSIS — I252 Old myocardial infarction: Secondary | ICD-10-CM | POA: Diagnosis not present

## 2023-02-24 MED ORDER — SULFAMETHOXAZOLE-TRIMETHOPRIM 800-160 MG PO TABS: 1.0000 | ORAL_TABLET | Freq: Two times a day (BID) | ORAL | 0 refills | Status: DC

## 2023-02-24 NOTE — Progress Notes (Signed)
Daily Session Note  Patient Details  Name: Larry Kent MRN: [id number] Date of Birth: [birth year] Referring Provider:   Flowsheet Row Cardiac Rehab from 12/01/2022 in [facility] Cardiac and Pulmonary Rehab  Referring Provider Dr. Lorine Bears, MD       Encounter Date: 02/24/2023  Check In:  Session Check In - 02/24/23 1126       Check-In   Supervising physician immediately available to respond to emergencies See telemetry face sheet for immediately available ER MD    Location [facility]-Cardiac & Pulmonary Rehab    Staff Present Rory Percy, MS, Exercise Physiologist;[name], RCP,RRT,BSRT;Maxon Descanso BS, , Exercise Physiologist;Janique Hoefer Katrinka Blazing, RN, ADN    Virtual Visit No    Medication changes reported     No    Fall or balance concerns reported    No    Warm-up and Cool-down Performed on first and last piece of equipment    Resistance Training Performed Yes    VAD Patient? No    PAD/SET Patient? No      Pain Assessment   Currently in Pain? No/denies                Social History   Tobacco Use  Smoking Status Never  Smokeless Tobacco Never    Goals Met:  Independence with exercise equipment Exercise tolerated well Personal goals reviewed No report of concerns or symptoms today Strength training completed today  Goals Unmet:  Not Applicable  Comments: Pt able to follow exercise prescription today without complaint.  Will continue to monitor for progression.    Dr. Bethann Punches is Medical Director for [facility] Cardiac Rehabilitation.  Dr. Vida Rigger is Medical Director for [facility] Pulmonary Rehabilitation.

## 2023-02-25 ENCOUNTER — Encounter: Payer: Medicare Other | Admitting: *Deleted

## 2023-02-25 ENCOUNTER — Other Ambulatory Visit: Payer: Self-pay | Admitting: Internal Medicine

## 2023-02-25 DIAGNOSIS — Z955 Presence of coronary angioplasty implant and graft: Secondary | ICD-10-CM

## 2023-02-25 DIAGNOSIS — I213 ST elevation (STEMI) myocardial infarction of unspecified site: Secondary | ICD-10-CM

## 2023-02-25 DIAGNOSIS — I252 Old myocardial infarction: Secondary | ICD-10-CM | POA: Diagnosis not present

## 2023-02-25 DIAGNOSIS — Z48812 Encounter for surgical aftercare following surgery on the circulatory system: Secondary | ICD-10-CM | POA: Diagnosis not present

## 2023-02-25 LAB — URINE CULTURE: Culture: 50000 — AB

## 2023-02-25 MED ORDER — NITROFURANTOIN MACROCRYSTAL 50 MG PO CAPS: 50.0000 mg | ORAL_CAPSULE | Freq: Two times a day (BID) | ORAL | 0 refills | Status: DC

## 2023-02-25 NOTE — Progress Notes (Signed)
Daily Session Note  Patient Details  Name: Larry Kent MRN: [id number] Date of Birth: 18-Jan-1946 Referring Provider:   Flowsheet Row Cardiac Rehab from 12/01/2022 in [facility] Cardiac and Pulmonary Rehab  Referring Provider Dr. Lorine Bears, MD       Encounter Date: 02/25/2023  Check In:  Session Check In - 02/25/23 1118       Check-In   Supervising physician immediately available to respond to emergencies See telemetry face sheet for immediately available ER MD    Location [facility]-Cardiac & Pulmonary Rehab    Staff Present Rory Percy, MS, Exercise Physiologist;Joseph Shelbie Proctor, RN, ADN    Virtual Visit No    Medication changes reported     No    Fall or balance concerns reported    No    Warm-up and Cool-down Performed on first and last piece of equipment    Resistance Training Performed Yes    VAD Patient? No    PAD/SET Patient? No      Pain Assessment   Currently in Pain? No/denies                Social History   Tobacco Use  Smoking Status Never  Smokeless Tobacco Never    Goals Met:  Independence with exercise equipment Exercise tolerated well No report of concerns or symptoms today Strength training completed today  Goals Unmet:  Not Applicable  Comments: Pt able to follow exercise prescription today without complaint.  Will continue to monitor for progression.    Dr. Bethann Punches is Medical Director for [facility] Cardiac Rehabilitation.  Dr. Vida Rigger is Medical Director for [facility] Pulmonary Rehabilitation.

## 2023-02-25 NOTE — Progress Notes (Signed)
Pt reached out to this NN confirming that the Macrodantin Dr Arbutus Ped ordered for his UTI is what he is supposed to take and not the Bactrim that was ordered on 8/28. Upon reviewing pt's chart, the Bactrim was discontinued by Dr.Mohamed. Pt informed by this NN that he should stop taking the Bactrim and start taking the Macrodantin as prescribed. No further questions from pt.

## 2023-02-26 ENCOUNTER — Encounter: Payer: Medicare Other | Admitting: *Deleted

## 2023-02-26 DIAGNOSIS — I213 ST elevation (STEMI) myocardial infarction of unspecified site: Secondary | ICD-10-CM

## 2023-02-26 DIAGNOSIS — M1711 Unilateral primary osteoarthritis, right knee: Secondary | ICD-10-CM | POA: Diagnosis not present

## 2023-02-26 DIAGNOSIS — I252 Old myocardial infarction: Secondary | ICD-10-CM | POA: Diagnosis not present

## 2023-02-26 DIAGNOSIS — Z955 Presence of coronary angioplasty implant and graft: Secondary | ICD-10-CM | POA: Diagnosis not present

## 2023-02-26 DIAGNOSIS — Z48812 Encounter for surgical aftercare following surgery on the circulatory system: Secondary | ICD-10-CM | POA: Diagnosis not present

## 2023-02-26 NOTE — Progress Notes (Signed)
Daily Session Note  Patient Details  Name: Larry Kent MRN: [id number] Date of Birth: 1945-07-19 Referring Provider:   Flowsheet Row Cardiac Rehab from 12/01/2022 in [facility]  Referring Provider Dr. Lorine Bears, MD       Encounter Date: 02/26/2023  Check In:  Session Check In - 02/26/23 1117       Check-In   Supervising physician immediately available to respond to emergencies See telemetry face sheet for immediately available ER MD    Location [facility]-Cardiac & Pulmonary Rehab    Staff Present Cyndia Diver, RN, BSN, Rita Ohara, RN, ADN;Larry Kent, RCP,RRT,BSRT    Virtual Visit No    Medication changes reported     No    Fall or balance concerns reported    No    Tobacco Cessation No Change    Warm-up and Cool-down Performed on first and last piece of equipment    Resistance Training Performed Yes    VAD Patient? No    PAD/SET Patient? No      Pain Assessment   Currently in Pain? No/denies                Social History   Tobacco Use  Smoking Status Never  Smokeless Tobacco Never    Goals Met:  Independence with exercise equipment Exercise tolerated well No report of concerns or symptoms today Strength training completed today  Goals Unmet:  Not Applicable  Comments: Pt able to follow exercise prescription today without complaint.  Will continue to monitor for progression.    Dr. Bethann Punches is Medical Director for [facility] Cardiac Rehabilitation.  Dr. Vida Rigger is Medical Director for [facility] Pulmonary Rehabilitation.

## 2023-03-03 ENCOUNTER — Encounter: Payer: Medicare Other | Attending: Cardiovascular Disease | Admitting: *Deleted

## 2023-03-03 ENCOUNTER — Encounter: Payer: Self-pay | Admitting: *Deleted

## 2023-03-03 DIAGNOSIS — Z5189 Encounter for other specified aftercare: Secondary | ICD-10-CM | POA: Insufficient documentation

## 2023-03-03 DIAGNOSIS — I213 ST elevation (STEMI) myocardial infarction of unspecified site: Secondary | ICD-10-CM

## 2023-03-03 DIAGNOSIS — Z955 Presence of coronary angioplasty implant and graft: Secondary | ICD-10-CM | POA: Insufficient documentation

## 2023-03-03 DIAGNOSIS — I252 Old myocardial infarction: Secondary | ICD-10-CM | POA: Insufficient documentation

## 2023-03-03 NOTE — Progress Notes (Signed)
Cardiac Individual Treatment Plan  Patient Details  Name: LUGENE KIELBASA MRN: [id number] Date of Birth: [birth year] Referring Provider:   Flowsheet Row Cardiac Rehab from 12/01/2022 in [facility] Cardiac and Pulmonary Rehab  Referring Provider Dr. Lorine Bears, MD       Initial Encounter Date:  Flowsheet Row Cardiac Rehab from 12/01/2022 in [facility] Cardiac and Pulmonary Rehab  Date 12/01/22       Visit Diagnosis: Status post coronary artery stent placement  ST elevation myocardial infarction (STEMI), unspecified artery (HCC)  Patient's Home Medications on Admission:  Current Outpatient Medications:    abiraterone acetate (ZYTIGA) 250 MG tablet, Take 1,000 mg by mouth daily., Disp: , Rfl:    acetaminophen (TYLENOL) 500 MG tablet, Take 1,000 mg by mouth every 6 (six) hours as needed for mild pain or headache., Disp: , Rfl:    Calcium Carb-Cholecalciferol (RA CALCIUM PLUS VITAMIN D) 600-10 MG-MCG TABS, Take 600 mg by mouth daily., Disp: , Rfl:    fluticasone (FLONASE) 50 MCG/ACT nasal spray, Place 1 spray into both nostrils daily as needed for allergies or rhinitis., Disp: , Rfl:    lidocaine-prilocaine (EMLA) cream, Apply to the Port-A-Cath site 30-60 minutes before chemotherapy treatment (Patient taking differently: Apply 1 Application topically See admin instructions. Apply to the Port-A-Cath site 30-60 minutes before chemotherapy treatment), Disp: 30 g, Rfl: 0   metoprolol tartrate (LOPRESSOR) 25 MG tablet, Take 0.5 tablets (12.5 mg total) by mouth 2 (two) times daily., Disp: 30 tablet, Rfl: 11   Multiple Vitamin (MULTIVITAMIN) tablet, Take 1 tablet by mouth daily with breakfast., Disp: , Rfl:    nitrofurantoin (MACRODANTIN) 50 MG capsule, Take 1 capsule (50 mg total) by mouth 2 (two) times daily., Disp: 10 capsule, Rfl: 0   predniSONE (DELTASONE) 5 MG tablet, Take 5 mg by mouth 2 (two) times daily., Disp: , Rfl:    prochlorperazine (COMPAZINE) 10 MG tablet, Take 1 tablet (10 mg total) by  mouth every 6 (six) hours as needed for nausea or vomiting., Disp: 30 tablet, Rfl: 0   rosuvastatin (CRESTOR) 20 MG tablet, Take 1 tablet (20 mg total) by mouth daily., Disp: 90 tablet, Rfl: 4   tamsulosin (FLOMAX) 0.4 MG CAPS capsule, Take 0.4 mg by mouth 2 (two) times daily., Disp: , Rfl:    ticagrelor (BRILINTA) 90 MG TABS tablet, Take 1 tablet (90 mg total) by mouth 2 (two) times daily., Disp: 60 tablet, Rfl: 11   Zinc 25 MG TABS, Take 25 mg by mouth daily., Disp: , Rfl:   Past Medical History: Past Medical History:  Diagnosis Date   Allergy    Arthritis    both knees    BCC (basal cell carcinoma), arm, right 09/2019   MOHS ([place])   BCC (basal cell carcinoma), face 2014   L preauricular s/p MOHS   BCC (basal cell carcinoma), face 02/2018   L upper lip   BPH (benign prostatic hypertrophy)    Dr. Mena Goes @ [facility]   Carotid stenosis 05/2015   RICA 40-59%, LICA 1-39%, L vertebral occlusion, rpt 1 yr   Cataract    removed both eyes    FHx: colon cancer    FHx: prostate cancer    Heart murmur    mild aortic stenosis    History of kidney stones    Hyperlipidemia    borderline- on rosuvastatin now normal    Occlusion of right vertebral artery 05/2015    Tobacco Use: Social History   Tobacco Use  Smoking Status  Never  Smokeless Tobacco Never    Labs: Review Flowsheet  More data exists      Latest Ref Rng & Units 09/23/2021 07/28/2022 08/21/2022 09/07/2022 12/22/2022  Labs for ITP Cardiac and Pulmonary Rehab  Cholestrol 0 - 200 mg/dL 454  - - 098  119   LDL (calc) 0 - 99 mg/dL 61  - - 64  64   HDL-C >39.00 mg/dL 14.78  - - 62  29.56   Trlycerides 0.0 - 149.0 mg/dL 21.3  - - 55  08.6   Hemoglobin A1c 4.6 - 6.5 % 6.1  - - 6.8  6.3   TCO2 22 - 32 mmol/L - 25  22  21   -    Details             Exercise Target Goals: Exercise Program Goal: Individual exercise prescription set using results from initial 6 min walk test and THRR while considering  patient's activity  barriers and safety.   Exercise Prescription Goal: Initial exercise prescription builds to 30-45 minutes a day of aerobic activity, 2-3 days per week.  Home exercise guidelines will be given to patient during program as part of exercise prescription that the participant will acknowledge.   Education: Aerobic Exercise: - Group verbal and visual presentation on the components of exercise prescription. Introduces F.I.T.T principle from ACSM for exercise prescriptions.  Reviews F.I.T.T. principles of aerobic exercise including progression. Written material given at graduation. Flowsheet Row Cardiac Rehab from 12/01/2022 in [facility] Cardiac and Pulmonary Rehab  Education need identified 12/01/22       Education: Resistance Exercise: - Group verbal and visual presentation on the components of exercise prescription. Introduces F.I.T.T principle from ACSM for exercise prescriptions  Reviews F.I.T.T. principles of resistance exercise including progression. Written material given at graduation.    Education: Exercise & Equipment Safety: - Individual verbal instruction and demonstration of equipment use and safety with use of the equipment. Flowsheet Row Cardiac Rehab from 12/01/2022 in [facility] Cardiac and Pulmonary Rehab  Date 12/01/22  Educator NT  Instruction Review Code 1- Verbalizes Understanding       Education: Exercise Physiology & General Exercise Guidelines: - Group verbal and written instruction with models to review the exercise physiology of the cardiovascular system and associated critical values. Provides general exercise guidelines with specific guidelines to those with heart or lung disease.    Education: Flexibility, Balance, Mind/Body Relaxation: - Group verbal and visual presentation with interactive activity on the components of exercise prescription. Introduces F.I.T.T principle from ACSM for exercise prescriptions. Reviews F.I.T.T. principles of flexibility and balance exercise  training including progression. Also discusses the mind body connection.  Reviews various relaxation techniques to help reduce and manage stress (i.e. Deep breathing, progressive muscle relaxation, and visualization). Balance handout provided to take home. Written material given at graduation.   Activity Barriers & Risk Stratification:  Activity Barriers & Cardiac Risk Stratification - 12/01/22 1451       Activity Barriers & Cardiac Risk Stratification   Activity Barriers Other (comment);Back Problems;Arthritis    Comments R knee pain    Cardiac Risk Stratification Moderate             6 Minute Walk:  6 Minute Walk     Row Name 12/01/22 1450         6 Minute Walk   Phase Initial     Distance 1295 feet     Walk Time 6 minutes     # of Rest Breaks 0  MPH 2.45     METS 2.5     RPE 7     Perceived Dyspnea  0     VO2 Peak 8.74     Symptoms No     Resting HR 60 bpm     Resting BP 102/50     Resting Oxygen Saturation  97 %     Exercise Oxygen Saturation  during 6 min walk 99 %     Max Ex. HR 91 bpm     Max Ex. BP 144/66     2 Minute Post BP 122/62              Oxygen Initial Assessment:   Oxygen Re-Evaluation:   Oxygen Discharge (Final Oxygen Re-Evaluation):   Initial Exercise Prescription:  Initial Exercise Prescription - 12/01/22 1400       Date of Initial Exercise RX and Referring Provider   Date 12/01/22    Referring Provider Dr. Lorine Bears, MD      Oxygen   Maintain Oxygen Saturation 88% or higher      Treadmill   MPH 2    Grade 0    Minutes 15    METs 2.53      NuStep   Level 2    SPM 80    Minutes 15    METs 2.5      REL-XR   Level 2    Speed 50    Minutes 15    METs 2.5      T5 Nustep   Level 1    SPM 80    Minutes 15    METs 2.5      Prescription Details   Frequency (times per week) 3    Duration Progress to 30 minutes of continuous aerobic without signs/symptoms of physical distress      Intensity   THRR  40-80% of Max Heartrate 93-126    Ratings of Perceived Exertion 11-13    Perceived Dyspnea 0-4      Progression   Progression Continue to progress workloads to maintain intensity without signs/symptoms of physical distress.      Resistance Training   Training Prescription Yes    Weight 4 lb    Reps 10-15             Perform Capillary Blood Glucose checks as needed.  Exercise Prescription Changes:   Exercise Prescription Changes     Row Name 12/01/22 1400 12/07/22 1600 12/23/22 1400 01/20/23 1400 02/03/23 1500     Response to Exercise   Blood Pressure (Admit) 102/50 108/52 118/62 110/58 122/54   Blood Pressure (Exercise) 144/66 122/58 128/60 138/70 130/64   Blood Pressure (Exit) 122/62 110/58 138/64 110/56 96/58   Heart Rate (Admit) 60 bpm 64 bpm 61 bpm 68 bpm 81 bpm   Heart Rate (Exercise) 91 bpm 94 bpm 97 bpm 122 bpm 108 bpm   Heart Rate (Exit) 59 bpm 76 bpm 69 bpm 84 bpm 76 bpm   Oxygen Saturation (Admit) 97 % -- -- -- --   Oxygen Saturation (Exercise) 99 % -- -- -- --   Rating of Perceived Exertion (Exercise) 7 12 13 13 13    Perceived Dyspnea (Exercise) 0 -- -- -- --   Symptoms None none none -- none   Comments Results first full day of exercise -- -- --   Duration -- Progress to 30 minutes of  aerobic without signs/symptoms of physical distress Continue with 30 min of aerobic exercise without signs/symptoms  of physical distress. Continue with 30 min of aerobic exercise without signs/symptoms of physical distress. Continue with 30 min of aerobic exercise without signs/symptoms of physical distress.   Intensity -- THRR unchanged THRR unchanged THRR unchanged THRR unchanged     Progression   Progression -- Continue to progress workloads to maintain intensity without signs/symptoms of physical distress. Continue to progress workloads to maintain intensity without signs/symptoms of physical distress. Continue to progress workloads to maintain intensity without  signs/symptoms of physical distress. Continue to progress workloads to maintain intensity without signs/symptoms of physical distress.   Average METs -- 2.46 2.63 2.71 2.64     Resistance Training   Training Prescription -- Yes Yes Yes Yes   Weight -- 4 lb 4 lb 5lb 6lb   Reps -- 10-15 10-15 10-15 10-15     Interval Training   Interval Training -- No No No No     Treadmill   MPH -- 2 2 2  2.3   Grade -- 0 0 0 0.5   Minutes -- 15 15 15 15    METs -- 2.53 2.53 2.53 2.92     NuStep   Level -- 2 3 4  --   Minutes -- 15 15 15  --   METs -- 2.4 2.53 2.6 --     REL-XR   Level -- -- 2 4 4    Speed -- -- -- -- 50   Minutes -- -- 15 15 15    METs -- -- 2.5 -- --     T5 Nustep   Level -- -- -- 2 1   SPM -- -- -- -- 80   Minutes -- -- -- 15 15   METs -- -- -- 2.6 2.6     Oxygen   Maintain Oxygen Saturation -- 88% or higher 88% or higher 88% or higher 88% or higher    Row Name 02/16/23 1300             Response to Exercise   Blood Pressure (Admit) 104/60       Blood Pressure (Exit) 126/68       Heart Rate (Admit) 65 bpm       Heart Rate (Exercise) 123 bpm       Heart Rate (Exit) 70 bpm       Rating of Perceived Exertion (Exercise) 14       Symptoms none       Duration Continue with 30 min of aerobic exercise without signs/symptoms of physical distress.       Intensity THRR unchanged         Progression   Progression Continue to progress workloads to maintain intensity without signs/symptoms of physical distress.       Average METs 2.75         Resistance Training   Training Prescription Yes       Weight 6lb       Reps 10-15         Interval Training   Interval Training No         Treadmill   MPH 2.1       Grade 3       Minutes 15       METs 3.48         REL-XR   Level 4       Minutes 15         T5 Nustep   Level 4       Minutes 15  METs 3         Oxygen   Maintain Oxygen Saturation 88% or higher                Exercise Comments:   Exercise  Comments     Row Name 12/07/22 1145           Exercise Comments First full day of exercise!  Patient was oriented to gym and equipment including functions, settings, policies, and procedures.  Patient's individual exercise prescription and treatment plan were reviewed.  All starting workloads were established based on the results of the 6 minute walk test done at initial orientation visit.  The plan for exercise progression was also introduced and progression will be customized based on patient's performance and goals.                Exercise Goals and Review:   Exercise Goals     Row Name 12/01/22 1309             Exercise Goals   Increase Physical Activity Yes       Intervention Develop an individualized exercise prescription for aerobic and resistive training based on initial evaluation findings, risk stratification, comorbidities and participant's personal goals.;Provide advice, education, support and counseling about physical activity/exercise needs.       Expected Outcomes Long Term: Exercising regularly at least 3-5 days a week.;Long Term: Add in home exercise to make exercise part of routine and to increase amount of physical activity.;Short Term: Attend rehab on a regular basis to increase amount of physical activity.       Increase Strength and Stamina Yes       Intervention Develop an individualized exercise prescription for aerobic and resistive training based on initial evaluation findings, risk stratification, comorbidities and participant's personal goals.;Provide advice, education, support and counseling about physical activity/exercise needs.       Expected Outcomes Short Term: Increase workloads from initial exercise prescription for resistance, speed, and METs.;Short Term: Perform resistance training exercises routinely during rehab and add in resistance training at home;Long Term: Improve cardiorespiratory fitness, muscular endurance and strength as measured by  increased METs and functional capacity ( )       Able to understand and use rate of perceived exertion (RPE) scale Yes       Intervention Provide education and explanation on how to use RPE scale       Expected Outcomes Short Term: Able to use RPE daily in rehab to express subjective intensity level;Long Term:  Able to use RPE to guide intensity level when exercising independently       Able to understand and use Dyspnea scale Yes       Intervention Provide education and explanation on how to use Dyspnea scale       Expected Outcomes Short Term: Able to use Dyspnea scale daily in rehab to express subjective sense of shortness of breath during exertion;Long Term: Able to use Dyspnea scale to guide intensity level when exercising independently       Knowledge and understanding of Target Heart Rate Range (THRR) Yes       Intervention Provide education and explanation of THRR including how the numbers were predicted and where they are located for reference       Expected Outcomes Long Term: Able to use THRR to govern intensity when exercising independently;Short Term: Able to state/look up THRR;Short Term: Able to use daily as guideline for intensity in rehab       Able  to check pulse independently Yes       Intervention Provide education and demonstration on how to check pulse in carotid and radial arteries.;Review the importance of being able to check your own pulse for safety during independent exercise       Expected Outcomes Short Term: Able to explain why pulse checking is important during independent exercise;Long Term: Able to check pulse independently and accurately       Understanding of Exercise Prescription Yes       Intervention Provide education, explanation, and written materials on patient's individual exercise prescription       Expected Outcomes Short Term: Able to explain program exercise prescription;Long Term: Able to explain home exercise prescription to exercise independently                 Exercise Goals Re-Evaluation :  Exercise Goals Re-Evaluation     Row Name 12/07/22 1146 12/23/22 1442 01/20/23 1433 02/03/23 1510 02/16/23 1354     Exercise Goal Re-Evaluation   Exercise Goals Review Increase Physical Activity;Increase Strength and Stamina;Able to understand and use rate of perceived exertion (RPE) scale;Knowledge and understanding of Target Heart Rate Range (THRR);Able to check pulse independently;Understanding of Exercise Prescription Increase Physical Activity;Increase Strength and Stamina;Understanding of Exercise Prescription Increase Physical Activity;Increase Strength and Stamina;Understanding of Exercise Prescription Increase Physical Activity;Increase Strength and Stamina;Understanding of Exercise Prescription Increase Physical Activity;Increase Strength and Stamina;Understanding of Exercise Prescription   Comments Reviewed RPE  and dyspnea scale, THR and program prescription with pt today.  Pt voiced understanding and was given a copy of goals to take home. Oluwaseun is new to the program. He has increased his NUstep T4 from level 2 to level 3. He maintained the TM at 2 MPH 0%elevation and maintianed the REXR at level 2. He continues to use 4 lb hand weights.  Will continue to monitor his progress. Michaeljames has increased several of his workloads with an RPE of 13.  TM 2/0to 2.2/0, T5Nustepup level 2 from level 1, T4Nustep from level 3 to level 4 and REXR from level 2 to level 4.  Will continue to monitor exercise progression with goal of increased stamina and strength. Joden continues to do well in rehab. He increased his hand weights from 5 to 6lbs. He also tried an incline of 0.5% on the treadmill, but eliminated incline in later sessions. We will continue to monitor his progress in the program. Braedan continues to do well in rehab. He recently increased his Treadmill grade from 0.5% to 3%. He also increased his level on the T5 nustep from 1 to 4. We will continue to  monitor his progress in the program.   Expected Outcomes Short: Use RPE daily to regulate intensity.  Long: Follow program prescription in THR. STG COntinue to see exercise progression. LTG He has increased stamina and strength from his exercise progression [id number] workloads as tolerated with goal of increased stamina and strength.   LTG: Continued exercise progression as tolerated during program and after discharge. Short: Try to add 0.5% incline or greater to his treadmill prescription again. Long: Continue exercise to improve strength and stamina. Short: Continue to follow current exercise prescription, and progressively increase workloads. Long: Continue exercise to improve strength and stamina.    Row Name 02/17/23 1718             Exercise Goal Re-Evaluation   Exercise Goals Review Increase Physical Activity;Increase Strength and Stamina;Understanding of Exercise Prescription       Comments Reviewed home  exercise with pt today.  Pt plans to use his at home recumbent bike 2 days per week for exercise. He also plans to start utilizing his 5 lb hand weights with the help of the at home exercise packet provided to him. Reviewed THR, pulse, RPE, sign and symptoms, pulse oximetery and when to call 911 or MD.  Also discussed weather considerations and indoor options.  Pt voiced understanding.       Expected Outcomes Short: Supplement rehab with at home exercise 2 times per week. Long: Continue exercise to improve strength and stamina.                Discharge Exercise Prescription (Final Exercise Prescription Changes):  Exercise Prescription Changes - 02/16/23 1300       Response to Exercise   Blood Pressure (Admit) 104/60    Blood Pressure (Exit) 126/68    Heart Rate (Admit) 65 bpm    Heart Rate (Exercise) 123 bpm    Heart Rate (Exit) 70 bpm    Rating of Perceived Exertion (Exercise) 14    Symptoms none    Duration Continue with 30 min of aerobic exercise without signs/symptoms  of physical distress.    Intensity THRR unchanged      Progression   Progression Continue to progress workloads to maintain intensity without signs/symptoms of physical distress.    Average METs 2.75      Resistance Training   Training Prescription Yes    Weight 6lb    Reps 10-15      Interval Training   Interval Training No      Treadmill   MPH 2.1    Grade 3    Minutes 15    METs 3.48      REL-XR   Level 4    Minutes 15      T5 Nustep   Level 4    Minutes 15    METs 3      Oxygen   Maintain Oxygen Saturation 88% or higher             Nutrition:  Target Goals: Understanding of nutrition guidelines, daily intake of sodium 1500mg , cholesterol 200mg , calories 30% from fat and 7% or less from saturated fats, daily to have 5 or more servings of fruits and vegetables.  Education: All About Nutrition: -Group instruction provided by verbal, written material, interactive activities, discussions, models, and posters to present general guidelines for heart healthy nutrition including fat, fiber, MyPlate, the role of sodium in heart healthy nutrition, utilization of the nutrition label, and utilization of this knowledge for meal planning. Follow up email sent as well. Written material given at graduation. Flowsheet Row Cardiac Rehab from 12/01/2022 in [facility] Cardiac and Pulmonary Rehab  Education need identified 12/01/22       Biometrics:  Pre Biometrics - 12/01/22 1455       Pre Biometrics   Height 5' 7.5" (1.715 m)    Weight 180 lb 3.2 oz (81.7 kg)    Waist Circumference 37.5 inches    Hip Circumference 39.5 inches    Waist to Hip Ratio 0.95 %    BMI (Calculated) 27.79    Single Leg Stand 12.7 seconds              Nutrition Therapy Plan and Nutrition Goals:  Nutrition Therapy & Goals - 12/01/22 1426       Intervention Plan   Intervention Prescribe, educate and counsel regarding individualized specific dietary modifications aiming towards targeted core  components such as weight, hypertension, lipid management, diabetes, heart failure and other comorbidities.    Expected Outcomes Short Term Goal: Understand basic principles of dietary content, such as calories, fat, sodium, cholesterol and nutrients.;Short Term Goal: A plan has been developed with personal nutrition goals set during dietitian appointment.;Long Term Goal: Adherence to prescribed nutrition plan.             Nutrition Assessments:  MEDIFICTS Score Key: ?70 Need to make dietary changes  40-70 Heart Healthy Diet ? 40 Therapeutic Level Cholesterol Diet  Flowsheet Row Cardiac Rehab from 12/01/2022 in [facility] Cardiac and Pulmonary Rehab  Picture Your Plate Total Score on Admission 63      Picture Your Plate Scores: <16 Unhealthy dietary pattern with much room for improvement. 41-50 Dietary pattern unlikely to meet recommendations for good health and room for improvement. 51-60 More healthful dietary pattern, with some room for improvement.  >60 Healthy dietary pattern, although there may be some specific behaviors that could be improved.    Nutrition Goals Re-Evaluation:  Nutrition Goals Re-Evaluation     Row Name 01/27/23 1115 02/24/23 1152           Goals   Current Weight 188 lb (85.3 kg) --      Nutrition Goal More protien options More protien options      Comment Meet with RD. Patient had to cancel previous RD apt. He wants to reschedule but will let us know in the future when he can do it. He is currently very busy with caretaking for his wife and doctors appointments, so would like to wait a little while before sheduling with the RD.      Expected Outcome Short: Make RD appointment. Long: adhere to a diet that pertains to him. Short: make an RD apt. Long: maintain a heart healthy diet.               Nutrition Goals Discharge (Final Nutrition Goals Re-Evaluation):  Nutrition Goals Re-Evaluation - 02/24/23 1152       Goals   Nutrition Goal More protien  options    Comment Patient had to cancel previous RD apt. He wants to reschedule but will let us know in the future when he can do it. He is currently very busy with caretaking for his wife and doctors appointments, so would like to wait a little while before sheduling with the RD.    Expected Outcome Short: make an RD apt. Long: maintain a heart healthy diet.             Psychosocial: Target Goals: Acknowledge presence or absence of significant depression and/or stress, maximize coping skills, provide positive support system. Participant is able to verbalize types and ability to use techniques and skills needed for reducing stress and depression.   Education: Stress, Anxiety, and Depression - Group verbal and visual presentation to define topics covered.  Reviews how body is impacted by stress, anxiety, and depression.  Also discusses healthy ways to reduce stress and to treat/manage anxiety and depression.  Written material given at graduation.   Education: Sleep Hygiene -Provides group verbal and written instruction about how sleep can affect your health.  Define sleep hygiene, discuss sleep cycles and impact of sleep habits. Review good sleep hygiene tips.    Initial Review & Psychosocial Screening:  Initial Psych Review & Screening - 11/25/22 1010       Initial Review   Current issues with None Identified      Family Dynamics  Good Support System? Yes   adult son/daughter and wife     Barriers   Psychosocial barriers to participate in program There are no identifiable barriers or psychosocial needs.      Screening Interventions   Interventions Encouraged to exercise;To provide support and resources with identified psychosocial needs;Provide feedback about the scores to participant    Expected Outcomes Short Term goal: Utilizing psychosocial counselor, staff and physician to assist with identification of specific Stressors or current issues interfering with healing process.  Setting desired goal for each stressor or current issue identified.;Long Term Goal: Stressors or current issues are controlled or eliminated.;Short Term goal: Identification and review with participant of any Quality of Life or Depression concerns found by scoring the questionnaire.;Long Term goal: The participant improves quality of Life and PHQ9 Scores as seen by post scores and/or verbalization of changes             Quality of Life Scores:   Quality of Life - 12/01/22 1438       Quality of Life   Select Quality of Life      Quality of Life Scores   Health/Function Pre 16.64 %    Socioeconomic Pre 21.13 %    Psych/Spiritual Pre 15.29 %    Family Pre 24 %    GLOBAL Pre 18.15 %            Scores of 19 and below usually indicate a poorer quality of life in these areas.  A difference of  2-3 points is a clinically meaningful difference.  A difference of 2-3 points in the total score of the Quality of Life Index has been associated with significant improvement in overall quality of life, self-image, physical symptoms, and general health in studies assessing change in quality of life.  PHQ-9: Review Flowsheet  More data exists      12/01/2022 10/06/2022 09/28/2022 09/30/2021 09/16/2020  Depression screen PHQ 2/9  Decreased Interest 0 0 0 0 0  Down, Depressed, Hopeless 0 0 0 0 0  PHQ - 2 Score 0 0 0 0 0  Altered sleeping 0 - - - -  Tired, decreased energy 1 - - - -  Change in appetite 0 - - - -  Feeling bad or failure about yourself  0 - - - -  Trouble concentrating 1 - - - -  Moving slowly or fidgety/restless 0 - - - -  Suicidal thoughts 0 - - - -  PHQ-9 Score 2 - - - -  Difficult doing work/chores Not difficult at all - - - -    Details           Interpretation of Total Score  Total Score Depression Severity:  1-4 = Minimal depression, 5-9 = Mild depression, 10-14 = Moderate depression, 15-19 = Moderately severe depression, 20-27 = Severe depression   Psychosocial  Evaluation and Intervention:  Psychosocial Evaluation - 11/25/22 1031       Psychosocial Evaluation & Interventions   Interventions Encouraged to exercise with the program and follow exercise prescription    Comments Mervyn has no barriers to attending the program. He lives with his wife and adult son. THey are his support along with his daughter.  He is undergoing chemo and is receiving a monthly dose. He continues to work Radio producer jobs. He wants to get back in shape he was in prior to his heart attack.  He is ready to get started    Expected Outcomes STG Attends all  scheduled sessions, Progresses with his exercise,following the guidelines.  LTG He continues with his exercise progression and following health guidelines    Continue Psychosocial Services  Follow up required by staff             Psychosocial Re-Evaluation:  Psychosocial Re-Evaluation     Row Name 01/27/23 1117 02/24/23 1154           Psychosocial Re-Evaluation   Current issues with Current Stress Concerns;Current Anxiety/Panic Current Stress Concerns;Current Anxiety/Panic      Comments Dylyn has done his first round of chemo in February. His wife is going through anxiety with his immunotherapy and is creating anxiety for him. His health issues are the brunt of is stressors and anxiety. Hanad states his main stress is related to his health and caregiving of his wife. He does have his daughter and son as support as the family works together to manage these medical situations. He has been working on finding counceling and mental health support for his wife, which he is hopeful will help with stress and anxiety levels for her and the family.      Expected Outcomes Short: Continue to exercise regularly to support mental health and notify staff of any changes. Long: maintain mental health and well being through teaching of rehab or prescribed medications independently. Short: continue to work towards getting the mental health and  counceling support he needs for his wife to help manage her caregiving needs. Long: establish balance with self care and caregiving. Continue to attend cardiac rehab as a way to care for self and have a break from caregiving.      Interventions Encouraged to attend Cardiac Rehabilitation for the exercise Encouraged to attend Cardiac Rehabilitation for the exercise      Continue Psychosocial Services  Follow up required by staff Follow up required by staff               Psychosocial Discharge (Final Psychosocial Re-Evaluation):  Psychosocial Re-Evaluation - 02/24/23 1154       Psychosocial Re-Evaluation   Current issues with Current Stress Concerns;Current Anxiety/Panic    Comments Ole states his main stress is related to his health and caregiving of his wife. He does have his daughter and son as support as the family works together to manage these medical situations. He has been working on finding counceling and mental health support for his wife, which he is hopeful will help with stress and anxiety levels for her and the family.    Expected Outcomes Short: continue to work towards getting the mental health and counceling support he needs for his wife to help manage her caregiving needs. Long: establish balance with self care and caregiving. Continue to attend cardiac rehab as a way to care for self and have a break from caregiving.    Interventions Encouraged to attend Cardiac Rehabilitation for the exercise    Continue Psychosocial Services  Follow up required by staff             Vocational Rehabilitation: Provide vocational rehab assistance to qualifying candidates.   Vocational Rehab Evaluation & Intervention:  Vocational Rehab - 11/25/22 1013       Initial Vocational Rehab Evaluation & Intervention   Assessment shows need for Vocational Rehabilitation No      Vocational Rehab Re-Evaulation   Comments working part time  was self employed              Education: Education Goals: Education classes will be provided  on a variety of topics geared toward better understanding of heart health and risk factor modification. Participant will state understanding/return demonstration of topics presented as noted by education test scores.  Learning Barriers/Preferences:   General Cardiac Education Topics:  AED/CPR: - Group verbal and written instruction with the use of models to demonstrate the basic use of the AED with the basic ABC's of resuscitation.   Anatomy and Cardiac Procedures: - Group verbal and visual presentation and models provide information about basic cardiac anatomy and function. Reviews the testing methods done to diagnose heart disease and the outcomes of the test results. Describes the treatment choices: Medical Management, Angioplasty, or Coronary Bypass Surgery for treating various heart conditions including Myocardial Infarction, Angina, Valve Disease, and Cardiac Arrhythmias.  Written material given at graduation.   Medication Safety: - Group verbal and visual instruction to review commonly prescribed medications for heart and lung disease. Reviews the medication, class of the drug, and side effects. Includes the steps to properly store meds and maintain the prescription regimen.  Written material given at graduation.   Intimacy: - Group verbal instruction through game format to discuss how heart and lung disease can affect sexual intimacy. Written material given at graduation..   Know Your Numbers and Heart Failure: - Group verbal and visual instruction to discuss disease risk factors for cardiac and pulmonary disease and treatment options.  Reviews associated critical values for Overweight/Obesity, Hypertension, Cholesterol, and Diabetes.  Discusses basics of heart failure: signs/symptoms and treatments.  Introduces Heart Failure Zone chart for action plan for heart failure.  Written material given at  graduation.   Infection Prevention: - Provides verbal and written material to individual with discussion of infection control including proper hand washing and proper equipment cleaning during exercise session. Flowsheet Row Cardiac Rehab from 12/01/2022 in [facility] Cardiac and Pulmonary Rehab  Date 12/01/22  Educator NT  Instruction Review Code 1- Verbalizes Understanding       Falls Prevention: - Provides verbal and written material to individual with discussion of falls prevention and safety. Flowsheet Row Cardiac Rehab from 12/01/2022 in [facility] Cardiac and Pulmonary Rehab  Date 11/25/22  Educator SB  Instruction Review Code 1- Verbalizes Understanding       Other: -Provides group and verbal instruction on various topics (see comments)   Knowledge Questionnaire Score:  Knowledge Questionnaire Score - 12/01/22 1425       Knowledge Questionnaire Score   Pre Score 24/26             Core Components/Risk Factors/Patient Goals at Admission:  Personal Goals and Risk Factors at Admission - 11/25/22 1012       Core Components/Risk Factors/Patient Goals on Admission    Weight Management Yes    Intervention Weight Management: Provide education and appropriate resources to help participant work on and attain dietary goals.;Weight Management: Develop a combined nutrition and exercise program designed to reach desired caloric intake, while maintaining appropriate intake of nutrient and fiber, sodium and fats, and appropriate energy expenditure required for the weight goal.    Admit Weight 175 lb (79.4 kg)    Goal Weight: Long Term 175 lb (79.4 kg)    Expected Outcomes Short Term: Continue to assess and modify interventions until short term weight is achieved;Long Term: Adherence to nutrition and physical activity/exercise program aimed toward attainment of established weight goal;Weight Maintenance: Understanding of the daily nutrition guidelines, which includes 25-35% calories from fat,  7% or less cal from saturated fats, less than 200mg  cholesterol, less  than 1.5gm of sodium, & 5 or more servings of fruits and vegetables daily    Lipids Yes    Intervention Provide education and support for participant on nutrition & aerobic/resistive exercise along with prescribed medications to achieve LDL 70mg , HDL >40mg .    Expected Outcomes Short Term: Participant states understanding of desired cholesterol values and is compliant with medications prescribed. Participant is following exercise prescription and nutrition guidelines.;Long Term: Cholesterol controlled with medications as prescribed, with individualized exercise RX and with personalized nutrition plan. Value goals: LDL < 70mg , HDL > 40 mg.             Education:Diabetes - Individual verbal and written instruction to review signs/symptoms of diabetes, desired ranges of glucose level fasting, after meals and with exercise. Acknowledge that pre and post exercise glucose checks will be done for 3 sessions at entry of program.   Core Components/Risk Factors/Patient Goals Review:   Goals and Risk Factor Review     Row Name 01/27/23 1119 02/24/23 1149           Core Components/Risk Factors/Patient Goals Review   Personal Goals Review Weight Management/Obesity;Hypertension Lipids      Review Kanishk would like to lose some weight. His weight has gone up to 188 pounds since the start of the program. He would like to lost some weight. He wants to reach a weight goal of 180lb. His blood pressures have been good and is checking at home. His blood pressure was 134/58 today. Informed him to check BP at home to correlate with out readings. Patient verbalises understanding. Kiwan reports that he is taking all his medication at prescibed. He is not having any trouble with with his meds and continues to work with his healthcare team to manage his medications.      Expected Outcomes Short: Lose a few pounds in the next few weeks and check  blood pressure at home. Long: maintain weight and blood pressure readings independently Short: continue to take all medications and work with healthcare team if any concerns arise. Long: continue to [facility] cardiac risk factors.               Core Components/Risk Factors/Patient Goals at Discharge (Final Review):   Goals and Risk Factor Review - 02/24/23 1149       Core Components/Risk Factors/Patient Goals Review   Personal Goals Review Lipids    Review Niguel reports that he is taking all his medication at prescibed. He is not having any trouble with with his meds and continues to work with his healthcare team to manage his medications.    Expected Outcomes Short: continue to take all medications and work with healthcare team if any concerns arise. Long: continue to [facility] cardiac risk factors.             ITP Comments:  ITP Comments     Row Name 11/25/22 1028 12/01/22 1309 12/07/22 1144 12/09/22 0731 01/05/23 1359   ITP Comments Virtual orientation call completed today. he has an appointment on Date: 12/01/2022  for EP eval and gym Orientation.  Documentation of diagnosis can be found in [facility] Date: 09/25/2022 . Completed and gym orientation. Initial ITP created and sent for review to Dr. Bethann Punches, Medical Director. First full day of exercise!  Patient was oriented to gym and equipment including functions, settings, policies, and procedures.  Patient's individual exercise prescription and treatment plan were reviewed.  All starting workloads were established based on the results of the 6  minute walk test done at initial orientation visit.  The plan for exercise progression was also introduced and progression will be customized based on patient's performance and goals. 30 Day review completed. Medical Director ITP review done, changes made as directed, and signed approval by Medical Director.   new to program 30 Day review completed. Medical Director ITP review done, changes made as  directed, and signed approval by Medical Director.    Row Name 02/03/23 1121 03/03/23 0931         ITP Comments 30 Day review completed. Medical Director ITP review done, changes made as directed, and signed approval by Medical Director. 30 Day review completed. Medical Director ITP review done, changes made as directed, and signed approval by Medical Director.               Comments:

## 2023-03-03 NOTE — Progress Notes (Signed)
Daily Session Note  Patient Details  Name: Larry Kent MRN: [id number] Date of Birth: 04/25/1946 Referring Provider:   Flowsheet Row Cardiac Rehab from 12/01/2022 in [facility] Cardiac and Pulmonary Rehab  Referring Provider Dr. Lorine Bears, MD       Encounter Date: 03/03/2023  Check In:  Session Check In - 03/03/23 1110       Check-In   Supervising physician immediately available to respond to emergencies See telemetry face sheet for immediately available ER MD    Location [facility]-Cardiac & Pulmonary Rehab    Staff Present Rory Percy, MS, Exercise Physiologist;Maxon Suzzette Righter, , Exercise Physiologist;Zekiah Caruth Jewel Baize, RN Atilano Median, RN, ADN    Virtual Visit No    Medication changes reported     No    Fall or balance concerns reported    No    Warm-up and Cool-down Performed on first and last piece of equipment    Resistance Training Performed Yes    VAD Patient? No    PAD/SET Patient? No      Pain Assessment   Currently in Pain? No/denies                Social History   Tobacco Use  Smoking Status Never  Smokeless Tobacco Never    Goals Met:  Independence with exercise equipment Exercise tolerated well No report of concerns or symptoms today Strength training completed today  Goals Unmet:  Not Applicable  Comments: Pt able to follow exercise prescription today without complaint.  Will continue to monitor for progression.    Dr. Bethann Punches is Medical Director for [facility] Cardiac Rehabilitation.  Dr. Vida Rigger is Medical Director for [facility] Pulmonary Rehabilitation.

## 2023-03-15 ENCOUNTER — Encounter: Payer: Medicare Other | Admitting: *Deleted

## 2023-03-15 DIAGNOSIS — Z5189 Encounter for other specified aftercare: Secondary | ICD-10-CM | POA: Diagnosis not present

## 2023-03-15 DIAGNOSIS — Z955 Presence of coronary angioplasty implant and graft: Secondary | ICD-10-CM | POA: Diagnosis not present

## 2023-03-15 DIAGNOSIS — I252 Old myocardial infarction: Secondary | ICD-10-CM | POA: Diagnosis not present

## 2023-03-15 DIAGNOSIS — I213 ST elevation (STEMI) myocardial infarction of unspecified site: Secondary | ICD-10-CM

## 2023-03-15 NOTE — Progress Notes (Signed)
Daily Session Note  Patient Details  Name: CAI PHIPPS MRN: [id number] Date of Birth: 11-24-1945 Referring Provider:   Flowsheet Row Cardiac Rehab from 12/01/2022 in [facility] Cardiac and Pulmonary Rehab  Referring Provider Dr. Lorine Bears, MD       Encounter Date: 03/15/2023  Check In:  Session Check In - 03/15/23 1123       Check-In   Supervising physician immediately available to respond to emergencies See telemetry face sheet for immediately available ER MD    Location [facility]-Cardiac & Pulmonary Rehab    Staff Present Rory Percy, MS, Exercise Physiologist;Kelly Madilyn Fireman, BS, ACSM CEP, Exercise Physiologist;Ani Deoliveira Katrinka Blazing, RN, ADN    Virtual Visit No    Medication changes reported     No    Fall or balance concerns reported    No    Warm-up and Cool-down Performed on first and last piece of equipment    Resistance Training Performed Yes    VAD Patient? No    PAD/SET Patient? No      Pain Assessment   Currently in Pain? No/denies                Social History   Tobacco Use  Smoking Status Never  Smokeless Tobacco Never    Goals Met:  Independence with exercise equipment Exercise tolerated well No report of concerns or symptoms today Strength training completed today  Goals Unmet:  Not Applicable  Comments: Pt able to follow exercise prescription today without complaint.  Will continue to monitor for progression.    Dr. Bethann Punches is Medical Director for [facility] Cardiac Rehabilitation.  Dr. Vida Rigger is Medical Director for [facility] Pulmonary Rehabilitation.

## 2023-03-17 ENCOUNTER — Encounter: Payer: Medicare Other | Admitting: *Deleted

## 2023-03-17 DIAGNOSIS — I213 ST elevation (STEMI) myocardial infarction of unspecified site: Secondary | ICD-10-CM

## 2023-03-17 DIAGNOSIS — I252 Old myocardial infarction: Secondary | ICD-10-CM | POA: Diagnosis not present

## 2023-03-17 DIAGNOSIS — Z955 Presence of coronary angioplasty implant and graft: Secondary | ICD-10-CM

## 2023-03-17 DIAGNOSIS — Z5189 Encounter for other specified aftercare: Secondary | ICD-10-CM | POA: Diagnosis not present

## 2023-03-17 NOTE — Progress Notes (Signed)
Daily Session Note  Patient Details  Name: Larry Kent MRN: [id number] Date of Birth: Jul 08, 1945 Referring Provider:   Flowsheet Row Cardiac Rehab from 12/01/2022 in [facility] Cardiac and Pulmonary Rehab  Referring Provider Dr. Lorine Bears, MD       Encounter Date: 03/17/2023  Check In:  Session Check In - 03/17/23 1134       Check-In   Supervising physician immediately available to respond to emergencies See telemetry face sheet for immediately available ER MD    Location [facility]-Cardiac & Pulmonary Rehab    Staff Present Rory Percy, MS, Exercise Physiologist;[name], RCP,RRT,BSRT;Maxon Conetta BS, , Exercise Physiologist;Shambhavi Salley Katrinka Blazing, RN, ADN    Virtual Visit No    Medication changes reported     No    Fall or balance concerns reported    No    Warm-up and Cool-down Performed on first and last piece of equipment    Resistance Training Performed Yes    VAD Patient? No    PAD/SET Patient? No      Pain Assessment   Currently in Pain? No/denies                Social History   Tobacco Use  Smoking Status Never  Smokeless Tobacco Never    Goals Met:  Independence with exercise equipment Exercise tolerated well No report of concerns or symptoms today Strength training completed today  Goals Unmet:  Not Applicable  Comments: Pt able to follow exercise prescription today without complaint.  Will continue to monitor for progression.    Dr. Bethann Punches is Medical Director for [facility] Cardiac Rehabilitation.  Dr. Vida Rigger is Medical Director for [facility] Pulmonary Rehabilitation.

## 2023-03-19 ENCOUNTER — Encounter: Payer: Medicare Other | Admitting: *Deleted

## 2023-03-19 DIAGNOSIS — Z955 Presence of coronary angioplasty implant and graft: Secondary | ICD-10-CM

## 2023-03-19 DIAGNOSIS — I252 Old myocardial infarction: Secondary | ICD-10-CM | POA: Diagnosis not present

## 2023-03-19 DIAGNOSIS — Z5189 Encounter for other specified aftercare: Secondary | ICD-10-CM | POA: Diagnosis not present

## 2023-03-19 DIAGNOSIS — I213 ST elevation (STEMI) myocardial infarction of unspecified site: Secondary | ICD-10-CM

## 2023-03-19 NOTE — Progress Notes (Signed)
Daily Session Note  Patient Details  Name: Larry Kent MRN: [id number] Date of Birth: [birth year] Referring Provider:   Flowsheet Row Cardiac Rehab from 12/01/2022 in [facility] Cardiac and Pulmonary Rehab  Referring Provider Dr. Lorine Bears, MD       Encounter Date: 03/19/2023  Check In:  Session Check In - 03/19/23 1140       Check-In   Supervising physician immediately available to respond to emergencies See telemetry face sheet for immediately available ER MD    Location [facility]-Cardiac & Pulmonary Rehab    Staff Present Cora Collum, RN, BSN, CCRP;Joseph Hood, RCP,RRT,BSRT;Noah Tickle, [place], Exercise Physiologist    Virtual Visit No    Medication changes reported     No    Fall or balance concerns reported    No    Warm-up and Cool-down Performed on first and last piece of equipment    Resistance Training Performed Yes    VAD Patient? No    PAD/SET Patient? No      Pain Assessment   Currently in Pain? No/denies                Social History   Tobacco Use  Smoking Status Never  Smokeless Tobacco Never    Goals Met:  Independence with exercise equipment Exercise tolerated well No report of concerns or symptoms today  Goals Unmet:  Not Applicable  Comments: Pt able to follow exercise prescription today without complaint.  Will continue to monitor for progression.    Dr. Bethann Punches is Medical Director for [facility] Cardiac Rehabilitation.  Dr. Vida Rigger is Medical Director for [facility] Pulmonary Rehabilitation.

## 2023-03-20 NOTE — Progress Notes (Unsigned)
[facility] OFFICE PROGRESS NOTE  Larry Boyden, MD [address] [place] [address]  DIAGNOSIS: 1) Metastatic poorly differentiated carcinoma, neuroendocrine carcinoma of suspicious prostate primary versus primary lung cancer presented with innumerable and bilateral pulmonary nodules in addition to right hilar and mediastinal lymphadenopathy and metastatic disease to several areas of the bone in addition to hypermetabolic activity in the prostate gland diagnosed in January 2024  2) prostate adenocarcinoma diagnosed in February 2024 currently managed by Dr. Mena Goes  PRIOR THERAPY: None   CURRENT THERAPY: Palliative systemic chemotherapy with carboplatin for AUC of 5 and Imfinzi 1500 Mg IV on day 1 as well as etoposide 100 Mg/M2 on days 1, 2 and 3 with Neulasta support on day 5. Status post 7 cycles. First dose on 08/11/22.  Starting from cycle #2 his carboplatin will be reduced to AUC of 4 and 2 etoposide 80 Mg/M2 secondary to intolerance and cycle #1.  Starting from cycle #5 he will be on maintenance treatment with single agent Imfinzi 1500 Mg IV every 4 weeks.   INTERVAL HISTORY: Larry Kent 77 y.o. male returns to clinic today for follow-up visit unaccompanied.  The patient was last seen 4 weeks ago by Dr. Arbutus Ped.  The patient is currently undergoing maintenance immunotherapy with Imfinzi for his lung cancer.  He tolerates this well overall without any concerning adverse side effects except he has had an area of a rash on his left cheek that is been there for 30 to 60 days or so.  The area is dry and occasionally itchy.  It is not warm to the touch or swollen.  He has tried steroid cream which has helped somewhat.  He has an upcoming appointment in the next 3 to 5 weeks with his dermatologist.  He mentions that his dermatologist in the past has given him cream to apply for actinic keratosis but he is not sure what the name of this cream is and if it contains any  harsh chemicals that can cause erythema.  The patient is wondering today what he can do to remain healthy.  He is trying to increase his protein in his diet and he is currently undergoing cardiac rehab 3 days a week.   He reports stable fatigue and dyspnea on exertion, such as climbing the stairs which requires him to rest.  He denies any fever, chills, or unexplained weight loss.  He has a good appetite.  He sometimes gets hot flashes from his Zytiga.  He also sees Dr. Mena Goes from urology and gets Eligard every 6 months.  He has an upcoming appointment with Dr. Mena Goes next week.  He denies any chest pain, hemoptysis, or cough.  Denies any nausea, vomiting, diarrhea, or constipation.  He is here today for evaluation repeat blood work before undergoing cycle number 8.      MEDICAL HISTORY: Past Medical History:  Diagnosis Date   Allergy    Arthritis    both knees    BCC (basal cell carcinoma), arm, right 09/2019   MOHS ([place])   BCC (basal cell carcinoma), face 2014   L preauricular s/p MOHS   BCC (basal cell carcinoma), face 02/2018   L upper lip   BPH (benign prostatic hypertrophy)    Dr. Mena Goes @ [facility]   Carotid stenosis 05/2015   RICA 40-59%, LICA 1-39%, L vertebral occlusion, rpt 1 yr   Cataract    removed both eyes    FHx: colon cancer  FHx: prostate cancer    Heart murmur    mild aortic stenosis    History of kidney stones    Hyperlipidemia    borderline- on rosuvastatin now normal    Occlusion of right vertebral artery 05/2015    ALLERGIES:  has No Known Allergies.  MEDICATIONS:  Current Outpatient Medications  Medication Sig Dispense Refill   methylPREDNISolone (MEDROL DOSEPAK) 4 MG TBPK tablet Use as instructed 21 tablet 0   abiraterone acetate (ZYTIGA) 250 MG tablet Take 1,000 mg by mouth daily.     acetaminophen (TYLENOL) 500 MG tablet Take 1,000 mg by mouth every 6 (six) hours as needed for mild pain or headache.     Calcium Carb-Cholecalciferol (RA  CALCIUM PLUS VITAMIN D) 600-10 MG-MCG TABS Take 600 mg by mouth daily.     fluticasone (FLONASE) 50 MCG/ACT nasal spray Place 1 spray into both nostrils daily as needed for allergies or rhinitis.     lidocaine-prilocaine (EMLA) cream Apply to the Port-A-Cath site 30-60 minutes before chemotherapy treatment (Patient taking differently: Apply 1 Application topically See admin instructions. Apply to the Port-A-Cath site 30-60 minutes before chemotherapy treatment) 30 g 0   metoprolol tartrate (LOPRESSOR) 25 MG tablet Take 0.5 tablets (12.5 mg total) by mouth 2 (two) times daily. 30 tablet 11   Multiple Vitamin (MULTIVITAMIN) tablet Take 1 tablet by mouth daily with breakfast.     nitrofurantoin (MACRODANTIN) 50 MG capsule Take 1 capsule (50 mg total) by mouth 2 (two) times daily. 10 capsule 0   predniSONE (DELTASONE) 5 MG tablet Take 5 mg by mouth 2 (two) times daily.     prochlorperazine (COMPAZINE) 10 MG tablet Take 1 tablet (10 mg total) by mouth every 6 (six) hours as needed for nausea or vomiting. 30 tablet 0   rosuvastatin (CRESTOR) 20 MG tablet Take 1 tablet (20 mg total) by mouth daily. 90 tablet 4   tamsulosin (FLOMAX) 0.4 MG CAPS capsule Take 0.4 mg by mouth 2 (two) times daily.     ticagrelor (BRILINTA) 90 MG TABS tablet Take 1 tablet (90 mg total) by mouth 2 (two) times daily. 60 tablet 11   Zinc 25 MG TABS Take 25 mg by mouth daily.     No current facility-administered medications for this visit.    SURGICAL HISTORY:  Past Surgical History:  Procedure Laterality Date   BASAL CELL CARCINOMA EXCISION  05/2018   lip, 2020 x3  basal cells removed    CATARACT EXTRACTION  2008   Left   CATARACT EXTRACTION  2013   Right (Eppes)   COLONOSCOPY  ~2010   medium int hemorrhoids, o/w WNL, rpt 5 yrs given fmhx (Dr. Randa Evens)   COLONOSCOPY  08/2019   TAs, diverticulosis, rpt 3 yrs (Armbruster)   CORONARY/GRAFT ACUTE MI REVASCULARIZATION N/A 09/07/2022   Procedure: Coronary/Graft Acute MI  Revascularization;  Surgeon: Orbie Pyo, MD;  Location: [facility] IN[facility];  Service: Cardiovascular;  Laterality: N/A;   ENDARTERECTOMY Right 12/01/2019   Procedure: RIGHT ENDARTERECTOMY CAROTID;  Surgeon: Cephus Shelling, MD;  Location: [facility] OR;  Service: Vascular;  Laterality: Right;   exercise treadmill  11/2005   WNL Eldridge Dace)   FINE NEEDLE ASPIRATION  07/28/2022   Procedure: FINE NEEDLE ASPIRATION (FNA) LINEAR;  Surgeon: Josephine Igo, DO;  Location: [facility] ENDOSCOPY;  Service: Pulmonary;;   HERNIA REPAIR  2008   Right   IR IMAGING GUIDED PORT INSERTION  08/11/2022   IR IMAGING GUIDED PORT INSERTION  10/08/2022  IR REMOVAL TUN ACCESS W/ PORT W/O FL MOD SED  09/10/2022   LEFT HEART CATH AND CORONARY ANGIOGRAPHY N/A 09/07/2022   Procedure: LEFT HEART CATH AND CORONARY ANGIOGRAPHY;  Surgeon: Orbie Pyo, MD;  Location: [facility];  Service: Cardiovascular;  Laterality: N/A;   MOHS SURGERY Left spring 2014   basal cell face   PATCH ANGIOPLASTY Right 12/01/2019   Procedure: PATCH ANGIOPLASTY USING Livia Snellen BIOLOGIC PATCH;  Surgeon: Cephus Shelling, MD;  Location: [facility] OR;  Service: Vascular;  Laterality: Right;   Testicular Biopsy  2003   benign, varicocele   US ECHOCARDIOGRAPHY  11/2005   aortic sclerosis, EF 55-60%, diastolic dysfunction   VIDEO BRONCHOSCOPY WITH ENDOBRONCHIAL ULTRASOUND Bilateral 07/28/2022   Procedure: VIDEO BRONCHOSCOPY WITH ENDOBRONCHIAL ULTRASOUND;  Surgeon: Josephine Igo, DO;  Location: [facility] ENDOSCOPY;  Service: Pulmonary;  Laterality: Bilateral;    REVIEW OF SYSTEMS:   Review of Systems  Constitutional: Positive for stable fatigue. Negative for appetite change, chills, fatigue, fever and unexpected weight change.  HENT: Negative for mouth sores, nosebleeds, sore throat and trouble swallowing.   Eyes: Negative for eye problems and icterus.  Respiratory: Positive for stable/improved shortness of breath with climbing the stairs. Negative for  cough, hemoptysis, and wheezing.   Cardiovascular: Negative for chest pain and leg swelling.  Gastrointestinal: Negative for abdominal pain, constipation, diarrhea, nausea and vomiting.  Genitourinary: Negative for bladder incontinence, difficulty urinating, dysuria, frequency and hematuria.   Musculoskeletal: Negative for back pain, gait problem, neck pain and neck stiffness.  Skin:  Positive for rash on left cheek Neurological: Negative for dizziness, extremity weakness, gait problem, headaches, light-headedness and seizures.  Hematological: Negative for adenopathy. Does not bruise/bleed easily.  Psychiatric/Behavioral: Negative for confusion, depression and sleep disturbance. The patient is not nervous/anxious.     PHYSICAL EXAMINATION:  There were no vitals taken for this visit.  ECOG PERFORMANCE STATUS: 1  Physical Exam  Constitutional: Oriented to person, place, and time and well-developed, well-nourished, and in no distress.  HENT:  Head: Normocephalic and atraumatic.  Mouth/Throat: Oropharynx is clear and moist. No oropharyngeal exudate.  Eyes: Conjunctivae are normal. Right eye exhibits no discharge. Left eye exhibits no discharge. No scleral icterus.  Neck: Normal range of motion. Neck supple.  Cardiovascular: Normal rate, regular rhythm, murmur noted and intact distal pulses.   Pulmonary/Chest: Effort normal and breath sounds normal. No respiratory distress. No wheezes. No rales.  Abdominal: Soft. Bowel sounds are normal. Exhibits no distension and no mass. There is no tenderness.  Musculoskeletal: Normal range of motion. Exhibits no edema.  Lymphadenopathy:    No cervical adenopathy.  Neurological: Alert and oriented to person, place, and time. Exhibits normal muscle tone. Gait normal. Coordination normal.  Skin: Skin is warm and dry. No rash noted. Not diaphoretic. No erythema. No pallor.  Psychiatric: Mood, memory and judgment normal.  Vitals reviewed.  LABORATORY  DATA: Lab Results  Component Value Date   WBC 7.1 03/23/2023   HGB 12.5 (L) 03/23/2023   HCT 36.8 (L) 03/23/2023   MCV 98.7 03/23/2023   PLT 142 (L) 03/23/2023      Chemistry      Component Value Date/Time   NA 137 03/23/2023 0930   K 3.9 03/23/2023 0930   CL 106 03/23/2023 0930   CO2 26 03/23/2023 0930   BUN 23 03/23/2023 0930   CREATININE 0.89 03/23/2023 0930      Component Value Date/Time   CALCIUM 9.2 03/23/2023 0930   ALKPHOS  160 (H) 03/23/2023 0930   AST 201 (HH) 03/23/2023 0930   ALT 391 (HH) 03/23/2023 0930   BILITOT 1.0 03/23/2023 0930       RADIOGRAPHIC STUDIES:  No results found.   ASSESSMENT/PLAN:  This is a very pleasant 77 year old Caucasian male recently diagnosed with  1) metastatic poorly differentiated carcinoma, neuroendocrine carcinoma of suspicious prostate primary versus primary lung cancer. He presented with innumerable and bilateral pulmonary nodules in addition to right hilar and mediastinal lymphadenopathy and metastatic disease to several areas of the bone diagnosed in January 2024. 2) Prostate adenocarcinoma diagnosed in February 2024 currently managed by Dr. Mena Goes  He was started on systemic chemotherapy with carboplatin for an AUC of 5 on day 1, etoposide 100 mg/m on days 1, 2, and 3 and immunotherapy with Imfinzi 1500 mg IV every 3 weeks with Neulasta support.  He is status post cycles of treatments.  Starting from cycle #5, he started maintenance immunotherapy with Imfinzi 1500 mg IV every 4 weeks.  Labs were reviewed.  His AST and ALT are elevated.  His most recent CT scan from last month did not show any concerning findings around the liver.  I reviewed his labs with Dr. Arbutus Ped.  This could be immunotherapy mediated hepatotoxicity.  Therefore, I will send a prescription for Medrol Dosepak to the pharmacy.  The patient is prescribed 5 mg twice daily of prednisone by Dr. Mena Goes.  I recommended that the patient take the Medrol Dosepak  instead of his outpatient prescription for prednisone and start taking this today.  We will then recheck his CMP next week to assess for improvement.  The patient was advised to avoid Tylenol, which she rarely takes Tylenol at this time.  He was also advised to avoid alcohol.  The patient's LFTs continue to worsen we may have to consider repeat imaging.  He also was advised if he develops any new or worsening symptoms such as abdominal pain, nausea, vomiting, etc. that he needs to be reevaluated.  Can resume taking his 5 mg of prednisone twice daily once he completes the Medrol Dosepak.  We reviewed the side effects of steroids including flushing, increased appetite, swelling, increased blood sugar, and insomnia.  He was advised to take the steroids in the morning.   Patient had questions about the mechanism of action behind immunotherapy and how long he would be on this medication.  We would recommend continuing this treatment as long as there is no disease progression.  We will see him back for follow-up visit in 1 weeks for evaluation repeat blood work before considering undergoing cycle #8.  He is followed by Dr. Mena Goes for his prostate adenocarcinoma which was diagnosed in February 2024 and he is currently receiving Eligard.  Is also on Zytiga.  He is scheduled to see Dr. Mena Goes next week.  Patient will continue with his cardiac rehab and continue to try and eat healthy.  The patient was advised to call immediately if he has any concerning symptoms in the interval. The patient voices understanding of current disease status and treatment options and is in agreement with the current care plan. All questions were answered. The patient knows to call the clinic with any problems, questions or concerns. We can certainly see the patient much sooner if necessary   Orders Placed This Encounter  Procedures   CBC with Differential (Cancer Center Only)    Standing Status:   Future    Standing  Expiration Date:   03/30/2024  CMP (Cancer Center only)    Standing Status:   Future    Standing Expiration Date:   03/30/2024      The total time spent in the appointment was 30-39 minutes  Dragon Thrush L Mckenzi Buonomo, PA-C 03/23/23

## 2023-03-22 ENCOUNTER — Encounter: Payer: Medicare Other | Admitting: *Deleted

## 2023-03-22 VITALS — Ht 67.5 in | Wt 188.0 lb

## 2023-03-22 DIAGNOSIS — I252 Old myocardial infarction: Secondary | ICD-10-CM | POA: Diagnosis not present

## 2023-03-22 DIAGNOSIS — Z955 Presence of coronary angioplasty implant and graft: Secondary | ICD-10-CM

## 2023-03-22 DIAGNOSIS — Z5189 Encounter for other specified aftercare: Secondary | ICD-10-CM | POA: Diagnosis not present

## 2023-03-22 DIAGNOSIS — I213 ST elevation (STEMI) myocardial infarction of unspecified site: Secondary | ICD-10-CM

## 2023-03-22 NOTE — Patient Instructions (Addendum)
Discharge Patient Instructions  Patient Details  Name: Larry Kent MRN: [id number] Date of Birth: 05/07/1946 Referring Provider:  [name], MD   Number of Visits: 53  Reason for Discharge:  Patient reached a stable level of exercise. Patient independent in their exercise. Patient has met program and personal goals.  Diagnosis:  Status post coronary artery stent placement  ST elevation myocardial infarction (STEMI), unspecified artery [facility])  Initial Exercise Prescription:  Initial Exercise Prescription - 12/01/22 1400       Date of Initial Exercise RX and Referring Provider   Date 12/01/22    Referring Provider Dr. Lorine Bears, MD      Oxygen   Maintain Oxygen Saturation 88% or higher      Treadmill   MPH 2    Grade 0    Minutes 15    METs 2.53      NuStep   Level 2    SPM 80    Minutes 15    METs 2.5      REL-XR   Level 2    Speed 50    Minutes 15    METs 2.5      T5 Nustep   Level 1    SPM 80    Minutes 15    METs 2.5      Prescription Details   Frequency (times per week) 3    Duration Progress to 30 minutes of continuous aerobic without signs/symptoms of physical distress      Intensity   THRR 40-80% of Max Heartrate 93-126    Ratings of Perceived Exertion 11-13    Perceived Dyspnea 0-4      Progression   Progression Continue to progress workloads to maintain intensity without signs/symptoms of physical distress.      Resistance Training   Training Prescription Yes    Weight 4 lb    Reps 10-15             Discharge Exercise Prescription (Final Exercise Prescription Changes):  Exercise Prescription Changes - 03/16/23 1500       Response to Exercise   Blood Pressure (Admit) 116/60    Blood Pressure (Exit) 108/64    Heart Rate (Admit) 60 bpm    Heart Rate (Exercise) 90 bpm    Heart Rate (Exit) 90 bpm    Rating of Perceived Exertion (Exercise) 12    Symptoms none    Duration Continue with 30 min of aerobic exercise  without signs/symptoms of physical distress.    Intensity THRR unchanged      Progression   Progression Continue to progress workloads to maintain intensity without signs/symptoms of physical distress.    Average METs 2.52      Resistance Training   Training Prescription Yes    Weight 6 lb    Reps 10-15      Interval Training   Interval Training No      Treadmill   MPH 2.4    Grade 0    Minutes 15    METs 2.84      T5 Nustep   Level 4    Minutes 15    METs 2.2      Oxygen   Maintain Oxygen Saturation 88% or higher             Functional Capacity:  6 Minute Walk     Row Name 12/01/22 1450 03/22/23 1128       6 Minute Walk   Phase Initial Discharge  Distance 1295 feet 1325 feet    Distance % Change -- 2.26 %    Distance Feet Change -- 30 ft    Walk Time 6 minutes 6 minutes    # of Rest Breaks 0 0    MPH 2.45 2.51    METS 2.5 2.6    RPE 7 12    Perceived Dyspnea  0 0    VO2 Peak 8.74 9.09    Symptoms No No    Resting HR 60 bpm 65 bpm    Resting BP 102/50 130/72    Resting Oxygen Saturation  97 % 96 %    Exercise Oxygen Saturation  during 6 min walk 99 % 95 %    Max Ex. HR 91 bpm 105 bpm    Max Ex. BP 144/66 140/66    2 Minute Post BP 122/62 116/64             Nutrition & Weight - Outcomes:  Pre Biometrics - 12/01/22 1455       Pre Biometrics   Height 5' 7.5" (1.715 m)    Weight 180 lb 3.2 oz (81.7 kg)    Waist Circumference 37.5 inches    Hip Circumference 39.5 inches    Waist to Hip Ratio 0.95 %    BMI (Calculated) 27.79    Single Leg Stand 12.7 seconds             Post Biometrics - 03/22/23 1130        Post  Biometrics   Height 5' 7.5" (1.715 m)    Weight 188 lb (85.3 kg)    Waist Circumference 41.5 inches    Hip Circumference 40 inches    Waist to Hip Ratio 1.04 %    BMI (Calculated) 28.99    Single Leg Stand 30 seconds

## 2023-03-22 NOTE — Progress Notes (Signed)
Daily Session Note  Patient Details  Name: Larry Kent MRN: [id number] Date of Birth: November 27, 1945 Referring Provider:   Flowsheet Row Cardiac Rehab from 12/01/2022 in [facility]  Referring Provider Dr. Lorine Bears, MD       Encounter Date: 03/22/2023  Check In:  Session Check In - 03/22/23 1135       Check-In   Supervising physician immediately available to respond to emergencies See telemetry face sheet for immediately available ER MD    Location [facility]-Cardiac & Pulmonary Rehab    Staff Present Maxon Conetta BS, , Exercise Physiologist;Kelly Madilyn Fireman, BS, ACSM CEP, Exercise Physiologist;Mitchell Iwanicki Katrinka Blazing, RN, ADN;Meredith Jewel Baize, RN BSN;Margaret Best, MS, Exercise Physiologist    Virtual Visit No    Medication changes reported     No    Fall or balance concerns reported    No    Warm-up and Cool-down Performed on first and last piece of equipment    Resistance Training Performed Yes    VAD Patient? No    PAD/SET Patient? No      Pain Assessment   Currently in Pain? No/denies                Social History   Tobacco Use  Smoking Status Never  Smokeless Tobacco Never    Goals Met:  Independence with exercise equipment Exercise tolerated well No report of concerns or symptoms today Strength training completed today  Goals Unmet:  Not Applicable  Comments: Pt able to follow exercise prescription today without complaint.  Will continue to monitor for progression.   6 Minute Walk     Row Name 12/01/22 1450 03/22/23 1128       6 Minute Walk   Phase Initial Discharge    Distance 1295 feet 1325 feet    Distance % Change -- 2.26 %    Distance Feet Change -- 30 ft    Walk Time 6 minutes 6 minutes    # of Rest Breaks 0 0    MPH 2.45 2.51    METS 2.5 2.6    RPE 7 12    Perceived Dyspnea  0 0    VO2 Peak 8.74 9.09    Symptoms No No    Resting HR 60 bpm 65 bpm    Resting BP 102/50 130/72    Resting Oxygen Saturation  97 % 96 %     Exercise Oxygen Saturation  during 6 min walk 99 % 95 %    Max Ex. HR 91 bpm 105 bpm    Max Ex. BP 144/66 140/66    2 Minute Post BP 122/62 116/64               Dr. Bethann Punches is Medical Director for [facility] Cardiac Rehabilitation.  Dr. Vida Rigger is Medical Director for [facility] Pulmonary Rehabilitation.

## 2023-03-23 ENCOUNTER — Ambulatory Visit: Payer: Medicare Other

## 2023-03-23 ENCOUNTER — Inpatient Hospital Stay: Payer: Medicare Other | Attending: Nurse Practitioner

## 2023-03-23 ENCOUNTER — Inpatient Hospital Stay: Payer: Medicare Other

## 2023-03-23 ENCOUNTER — Telehealth: Payer: Self-pay | Admitting: Internal Medicine

## 2023-03-23 ENCOUNTER — Inpatient Hospital Stay: Payer: Medicare Other | Admitting: Physician Assistant

## 2023-03-23 ENCOUNTER — Telehealth: Payer: Self-pay

## 2023-03-23 VITALS — BP 143/82 | HR 53 | Temp 98.2°F | Resp 18

## 2023-03-23 DIAGNOSIS — Z960 Presence of urogenital implants: Secondary | ICD-10-CM

## 2023-03-23 DIAGNOSIS — C7A1 Malignant poorly differentiated neuroendocrine tumors: Secondary | ICD-10-CM | POA: Diagnosis not present

## 2023-03-23 DIAGNOSIS — C61 Malignant neoplasm of prostate: Secondary | ICD-10-CM | POA: Diagnosis not present

## 2023-03-23 DIAGNOSIS — C3490 Malignant neoplasm of unspecified part of unspecified bronchus or lung: Secondary | ICD-10-CM

## 2023-03-23 DIAGNOSIS — K769 Liver disease, unspecified: Secondary | ICD-10-CM | POA: Insufficient documentation

## 2023-03-23 DIAGNOSIS — K7460 Unspecified cirrhosis of liver: Secondary | ICD-10-CM | POA: Insufficient documentation

## 2023-03-23 DIAGNOSIS — R7989 Other specified abnormal findings of blood chemistry: Secondary | ICD-10-CM | POA: Diagnosis not present

## 2023-03-23 DIAGNOSIS — Z5112 Encounter for antineoplastic immunotherapy: Secondary | ICD-10-CM | POA: Diagnosis not present

## 2023-03-23 DIAGNOSIS — C7801 Secondary malignant neoplasm of right lung: Secondary | ICD-10-CM | POA: Diagnosis not present

## 2023-03-23 DIAGNOSIS — C801 Malignant (primary) neoplasm, unspecified: Secondary | ICD-10-CM | POA: Diagnosis not present

## 2023-03-23 LAB — CBC WITH DIFFERENTIAL (CANCER CENTER ONLY)
Abs Immature Granulocytes: 0.03 10*3/uL (ref 0.00–0.07)
Basophils Absolute: 0.1 10*3/uL (ref 0.0–0.1)
Basophils Relative: 1 %
Eosinophils Absolute: 0.1 10*3/uL (ref 0.0–0.5)
Eosinophils Relative: 1 %
HCT: 36.8 % — ABNORMAL LOW (ref 39.0–52.0)
Hemoglobin: 12.5 g/dL — ABNORMAL LOW (ref 13.0–17.0)
Immature Granulocytes: 0 %
Lymphocytes Relative: 15 %
Lymphs Abs: 1 10*3/uL (ref 0.7–4.0)
MCH: 33.5 pg (ref 26.0–34.0)
MCHC: 34 g/dL (ref 30.0–36.0)
MCV: 98.7 fL (ref 80.0–100.0)
Monocytes Absolute: 0.8 10*3/uL (ref 0.1–1.0)
Monocytes Relative: 11 %
Neutro Abs: 5.2 10*3/uL (ref 1.7–7.7)
Neutrophils Relative %: 72 %
Platelet Count: 142 10*3/uL — ABNORMAL LOW (ref 150–400)
RBC: 3.73 MIL/uL — ABNORMAL LOW (ref 4.22–5.81)
RDW: 14.3 % (ref 11.5–15.5)
WBC Count: 7.1 10*3/uL (ref 4.0–10.5)
nRBC: 0 % (ref 0.0–0.2)

## 2023-03-23 LAB — CMP (CANCER CENTER ONLY)
ALT: 391 U/L (ref 0–44)
AST: 201 U/L (ref 15–41)
Albumin: 3.9 g/dL (ref 3.5–5.0)
Alkaline Phosphatase: 160 U/L — ABNORMAL HIGH (ref 38–126)
Anion gap: 5 (ref 5–15)
BUN: 23 mg/dL (ref 8–23)
CO2: 26 mmol/L (ref 22–32)
Calcium: 9.2 mg/dL (ref 8.9–10.3)
Chloride: 106 mmol/L (ref 98–111)
Creatinine: 0.89 mg/dL (ref 0.61–1.24)
GFR, Estimated: >60 mL/min (ref >60)
Glucose, Bld: 192 mg/dL — ABNORMAL HIGH (ref 70–99)
Potassium: 3.9 mmol/L (ref 3.5–5.1)
Sodium: 137 mmol/L (ref 135–145)
Total Bilirubin: 1 mg/dL (ref 0.3–1.2)
Total Protein: 6.8 g/dL (ref 6.5–8.1)

## 2023-03-23 LAB — SAMPLE TO BLOOD BANK

## 2023-03-23 MED ORDER — SODIUM CHLORIDE 0.9% FLUSH
10.0000 mL | Freq: Once | INTRAVENOUS | Status: AC
  Administered 2023-03-23: 10 mL

## 2023-03-23 MED ORDER — METHYLPREDNISOLONE 4 MG PO TBPK
ORAL_TABLET | ORAL | 0 refills | Status: AC
Start: 2023-03-23 — End: ?

## 2023-03-23 MED ORDER — HEPARIN SOD (PORK) LOCK FLUSH 100 UNIT/ML IV SOLN
500.0000 [IU] | Freq: Once | INTRAVENOUS | Status: AC
  Administered 2023-03-23: 500 [IU]

## 2023-03-23 NOTE — Telephone Encounter (Signed)
Scheduled per 09/24 los, patient has been called and notified of upcoming appointments.

## 2023-03-23 NOTE — Telephone Encounter (Signed)
CRITICAL VALUE STICKER  CRITICAL VALUE: ALT 391, AST 201  RECEIVER (on-site recipient of call): Sharlette Dense CMA  DATE & TIME NOTIFIED: 03/23/2023  MESSENGER (representative from lab): Amber in Lab  MD NOTIFIED: Dr. Arbutus Ped  TIME OF NOTIFICATION: 1059  RESPONSE: Made physician aware treatment was canceled

## 2023-03-23 NOTE — Progress Notes (Signed)
[facility] OFFICE PROGRESS NOTE  Larry Boyden, MD [address] [place] [address]  DIAGNOSIS: 1) Metastatic poorly differentiated carcinoma, neuroendocrine carcinoma of suspicious prostate primary versus primary lung cancer presented with innumerable and bilateral pulmonary nodules in addition to right hilar and mediastinal lymphadenopathy and metastatic disease to several areas of the bone in addition to hypermetabolic activity in the prostate gland diagnosed in January 2024  2) prostate adenocarcinoma diagnosed in February 2024 currently managed by Dr. Mena Goes. Currently on Zitiga and Eligard.   PRIOR THERAPY: None  CURRENT THERAPY: 1) Palliative systemic chemotherapy with carboplatin for AUC of 5 and Imfinzi 1500 Mg IV on day 1 as well as etoposide 100 Mg/M2 on days 1, 2 and 3 with Neulasta support on day 5. Status post 7 cycles. First dose on 08/11/22.  Starting from cycle #2 his carboplatin will be reduced to AUC of 4 and 2 etoposide 80 Mg/M2 secondary to intolerance and cycle #1.  Starting from cycle #5 he will be on maintenance treatment with single agent Imfinzi 1500 Mg IV every 4 weeks.  2) Currently on Zitiga and Eligard through urology for prostate cancer  INTERVAL HISTORY: Larry Kent 77 y.o. male returns to the clinic today for a follow-up visit.  The patient was last seen in the clinic by myself 1 week ago.  At that point time, he was feeling fairly well but he had unexpected elevated LFTs which could be secondary to his immunotherapy.  Therefore, he was given a Medrol Dosepak which he completed.  He continues to deny any jaundice, itching, or significant abdominal pain.  He had had a restaging CT scan in late August 2024 which showed positive response to treatment and no concerning findings for progression.  His last appointment, he had a slight rash on his left cheek that had been there for 1 to 2 months.  He has an upcoming appointment with  dermatology. However, the rash seemed to have worsened and he saw his PCP yesterday. They treated for possible herpes zoster given that he is immunocompromised, although the appearance is atypical. They also gave antibiotics for possible cellulitis. The rash also extended to his left eyelid. While the rash looks worse to me today compared to when I saw him last week, and the picture in the chart from his PCP visit, the patient states the area on the left eyelid is improving. The rash appears more erythematous today and is well demarcated. He mentions he is scheduled to see opthalmology next week.   He is currently undergoing cardiac rehab 3 times a week and increasing the protein in his diet. He reports stable fatigue and dyspnea on exertion, such as climbing the stairs which requires him to rest. However, he continues to be active with treadmill exercises and cardio and states he is able to do this without being short of breath. He denies any fever, chills, or unexplained weight loss.  He has a good appetite.  He sometimes gets hot flashes from his Zytiga.  He also sees Dr. Mena Goes from urology and gets Eligard every 6 months. He denies any chest pain, hemoptysis, or cough.  Denies any nausea, vomiting, diarrhea, or constipation.  He is here today for evaluation repeat blood work before undergoing cycle number 8.      MEDICAL HISTORY: Past Medical History:  Diagnosis Date   Allergy    Arthritis    both knees    BCC (basal cell carcinoma), arm, right 09/2019  MOHS ([place])   BCC (basal cell carcinoma), face 2014   L preauricular s/p MOHS   BCC (basal cell carcinoma), face 02/2018   L upper lip   BPH (benign prostatic hypertrophy)    Dr. Mena Goes @ [facility]   Carotid stenosis 05/2015   RICA 40-59%, LICA 1-39%, L vertebral occlusion, rpt 1 yr   Cataract    removed both eyes    FHx: colon cancer    FHx: prostate cancer    Heart murmur    mild aortic stenosis    History of kidney stones     Hyperlipidemia    borderline- on rosuvastatin now normal    Occlusion of right vertebral artery 05/2015    ALLERGIES:  has No Known Allergies.  MEDICATIONS:  Current Outpatient Medications  Medication Sig Dispense Refill   abiraterone acetate (ZYTIGA) 250 MG tablet Take 1,000 mg by mouth daily.     acetaminophen (TYLENOL) 500 MG tablet Take 1,000 mg by mouth every 6 (six) hours as needed for mild pain or headache.     Calcium Carb-Cholecalciferol (RA CALCIUM PLUS VITAMIN D) 600-10 MG-MCG TABS Take 600 mg by mouth daily.     cephALEXin (KEFLEX) 500 MG capsule Take 1 capsule (500 mg total) by mouth 3 (three) times daily. 21 capsule 0   fluticasone (FLONASE) 50 MCG/ACT nasal spray Place 1 spray into both nostrils daily as needed for allergies or rhinitis.     lidocaine-prilocaine (EMLA) cream Apply to the Port-A-Cath site 30-60 minutes before chemotherapy treatment (Patient taking differently: Apply 1 Application topically See admin instructions. Apply to the Port-A-Cath site 30-60 minutes before chemotherapy treatment) 30 g 0   methylPREDNISolone (MEDROL DOSEPAK) 4 MG TBPK tablet Use as instructed 21 tablet 0   metoprolol tartrate (LOPRESSOR) 25 MG tablet Take 0.5 tablets (12.5 mg total) by mouth 2 (two) times daily. 30 tablet 11   Multiple Vitamin (MULTIVITAMIN) tablet Take 1 tablet by mouth daily with breakfast.     nitrofurantoin (MACRODANTIN) 50 MG capsule Take 1 capsule (50 mg total) by mouth 2 (two) times daily. 10 capsule 0   predniSONE (DELTASONE) 5 MG tablet Take 5 mg by mouth 2 (two) times daily.     prochlorperazine (COMPAZINE) 10 MG tablet Take 1 tablet (10 mg total) by mouth every 6 (six) hours as needed for nausea or vomiting. 30 tablet 0   rosuvastatin (CRESTOR) 20 MG tablet Take 1 tablet (20 mg total) by mouth daily. 90 tablet 4   tamsulosin (FLOMAX) 0.4 MG CAPS capsule Take 0.4 mg by mouth 2 (two) times daily.     ticagrelor (BRILINTA) 90 MG TABS tablet Take 1 tablet (90 mg  total) by mouth 2 (two) times daily. 60 tablet 11   valACYclovir (VALTREX) 1000 MG tablet Take 1 tablet (1,000 mg total) by mouth 3 (three) times daily. 21 tablet 0   Zinc 25 MG TABS Take 25 mg by mouth daily.     No current facility-administered medications for this visit.    SURGICAL HISTORY:  Past Surgical History:  Procedure Laterality Date   BASAL CELL CARCINOMA EXCISION  05/2018   lip, 2020 x3  basal cells removed    CATARACT EXTRACTION  2008   Left   CATARACT EXTRACTION  2013   Right (Eppes)   COLONOSCOPY  ~2010   medium int hemorrhoids, o/w WNL, rpt 5 yrs given fmhx (Dr. Randa Evens)   COLONOSCOPY  08/2019   TAs, diverticulosis, rpt 3 yrs (Armbruster)   CORONARY/GRAFT ACUTE MI  REVASCULARIZATION N/A 09/07/2022   Procedure: Coronary/Graft Acute MI Revascularization;  Surgeon: Orbie Pyo, MD;  Location: [facility] INVASIVE CV LAB;  Service: Cardiovascular;  Laterality: N/A;   ENDARTERECTOMY Right 12/01/2019   Procedure: RIGHT ENDARTERECTOMY CAROTID;  Surgeon: Cephus Shelling, MD;  Location: [facility] OR;  Service: Vascular;  Laterality: Right;   exercise treadmill  11/2005   WNL Eldridge Dace)   FINE NEEDLE ASPIRATION  07/28/2022   Procedure: FINE NEEDLE ASPIRATION (FNA) LINEAR;  Surgeon: Josephine Igo, DO;  Location: [facility] ENDOSCOPY;  Service: Pulmonary;;   HERNIA REPAIR  2008   Right   IR IMAGING GUIDED PORT INSERTION  08/11/2022   IR IMAGING GUIDED PORT INSERTION  10/08/2022   IR REMOVAL TUN ACCESS W/ PORT W/O FL MOD SED  09/10/2022   LEFT HEART CATH AND CORONARY ANGIOGRAPHY N/A 09/07/2022   Procedure: LEFT HEART CATH AND CORONARY ANGIOGRAPHY;  Surgeon: Orbie Pyo, MD;  Location: [facility] INVASIVE CV LAB;  Service: Cardiovascular;  Laterality: N/A;   MOHS SURGERY Left spring 2014   basal cell face   PATCH ANGIOPLASTY Right 12/01/2019   Procedure: PATCH ANGIOPLASTY USING Livia Snellen BIOLOGIC PATCH;  Surgeon: Cephus Shelling, MD;  Location: [facility] OR;  Service: Vascular;  Laterality: Right;    Testicular Biopsy  2003   benign, varicocele   US ECHOCARDIOGRAPHY  11/2005   aortic sclerosis, EF 55-60%, diastolic dysfunction   VIDEO BRONCHOSCOPY WITH ENDOBRONCHIAL ULTRASOUND Bilateral 07/28/2022   Procedure: VIDEO BRONCHOSCOPY WITH ENDOBRONCHIAL ULTRASOUND;  Surgeon: Josephine Igo, DO;  Location: [facility] ENDOSCOPY;  Service: Pulmonary;  Laterality: Bilateral;    REVIEW OF SYSTEMS:   Constitutional: Positive for stable fatigue. Negative for appetite change, chills, fatigue, fever and unexpected weight change.  HENT: Negative for mouth sores, nosebleeds, sore throat and trouble swallowing.   Eyes: Negative for eye problems and icterus.  Respiratory: Positive for stable/improved shortness of breath with climbing the stairs. Negative for cough, hemoptysis, and wheezing.   Cardiovascular: Negative for chest pain and leg swelling.  Gastrointestinal: Negative for abdominal pain, constipation, diarrhea, nausea and vomiting.  Genitourinary: Negative for bladder incontinence, difficulty urinating, dysuria, frequency and hematuria.   Musculoskeletal: Negative for back pain, gait problem, neck pain and neck stiffness.  Skin:  Positive for rash on left cheek Neurological: Negative for dizziness, extremity weakness, gait problem, headaches, light-headedness and seizures.  Hematological: Negative for adenopathy. Does not bruise/bleed easily.  Psychiatric/Behavioral: Negative for confusion, depression and sleep disturbance. The patient is not nervous/anxious    PHYSICAL EXAMINATION:  Blood pressure 118/64, pulse 95, temperature 98.3 F (36.8 C), temperature source Oral, resp. rate 17, weight 187 lb (84.8 kg), SpO2 94%.  ECOG PERFORMANCE STATUS: 1  Physical Exam  Constitutional: Oriented to person, place, and time and well-developed, well-nourished, and in no distress.  HENT:  Head: Normocephalic and atraumatic.  Mouth/Throat: Oropharynx is clear and moist. No oropharyngeal exudate.  Eyes:  Conjunctivae are normal. Right eye exhibits no discharge. Left eye exhibits no discharge. No scleral icterus.  Neck: Normal range of motion. Neck supple.  Cardiovascular: Normal rate, regular rhythm, murmur noted and intact distal pulses.   Pulmonary/Chest: Effort normal and breath sounds normal. No respiratory distress. No wheezes. No rales.  Abdominal: Soft. Bowel sounds are normal. Exhibits no distension and no mass. There is no tenderness.  Musculoskeletal: Normal range of motion. Exhibits no edema.  Lymphadenopathy:    No cervical adenopathy.  Neurological: Alert and oriented to person, place, and time. Exhibits normal muscle tone. Gait normal.  Coordination normal.  Skin: Skin is warm and dry. Left sided facial rash. Well demarcated and erythematous. Not diaphoretic. No erythema. No pallor.  Psychiatric: Mood, memory and judgment normal.  Vitals reviewed.  LABORATORY DATA: Lab Results  Component Value Date   WBC 10.1 03/30/2023   HGB 13.9 03/30/2023   HCT 40.4 03/30/2023   MCV 97.6 03/30/2023   PLT 159 03/30/2023      Chemistry      Component Value Date/Time   NA 137 03/23/2023 0930   K 3.9 03/23/2023 0930   CL 106 03/23/2023 0930   CO2 26 03/23/2023 0930   BUN 23 03/23/2023 0930   CREATININE 0.89 03/23/2023 0930      Component Value Date/Time   CALCIUM 9.2 03/23/2023 0930   ALKPHOS 160 (H) 03/23/2023 0930   AST 201 (HH) 03/23/2023 0930   ALT 391 (HH) 03/23/2023 0930   BILITOT 1.0 03/23/2023 0930       RADIOGRAPHIC STUDIES:  No results found.   ASSESSMENT/PLAN:  This is a very pleasant 77 year old Caucasian male recently diagnosed with  1) metastatic poorly differentiated carcinoma, neuroendocrine carcinoma of suspicious prostate primary versus primary lung cancer. He presented with innumerable and bilateral pulmonary nodules in addition to right hilar and mediastinal lymphadenopathy and metastatic disease to several areas of the bone diagnosed in January  2024. 2) Prostate adenocarcinoma diagnosed in February 2024 currently managed by Dr. Mena Goes   He was started on systemic chemotherapy with carboplatin for an AUC of 5 on day 1, etoposide 100 mg/m on days 1, 2, and 3 and immunotherapy with Imfinzi 1500 mg IV every 3 weeks with Neulasta support.  He is status post cycles of treatments.  Starting from cycle #5, he started maintenance immunotherapy with Imfinzi 1500 mg IV every 4 weeks.   This week, he had elevated AST and ALT.  He was given a Medrol Dosepak for possible immunotherapy mediated hepatotoxicity.  He completed this and his LFTs today are persistently elevated, although mildly improved.  His AST is elevated at 139 (compared to 201 on 9/24) and his ALT is 334 (391 on 9/24) and alk phos is 132 (compared to 160 on 9/24). The patient was seen with Dr. Arbutus Ped today.  Labs were reviewed.  The patient's elevated LFTs could be secondary to immune mediated hepatotoxicity.  Therefore Dr. Arbutus Ped recommends a dose prednisone taper.  The patient will take 80 mg daily for 1 week followed by 60 mg daily for 1 week followed by 40 mg daily for 1 week followed by 20 mg daily for 1 week followed by 10 mg daily for 1 week and then stop.  We will arrange for restaging CT scan of the chest, abdomen, pelvis prior to his next follow-up visit in 5 weeks.  We will make a lab only visit in 2 weeks to ensure improvement in his LFTs.  Of course, I cautioned the patient if he develops any new symptoms such as fevers, abdominal pain, jaundice, itching, nausea or vomiting, etc. that he needs medical reevaluation.   He is followed by Dr. Mena Goes for his prostate adenocarcinoma which was diagnosed in February 2024 and he is currently receiving Eligard.  Is also on Zytiga.    He will continue with valtrex and anti-biotics for possible shingles vs cellulitis.    Patient will continue with his cardiac rehab and continue to try and eat healthy.  The patient was advised to  call immediately if he has any concerning symptoms in  the interval. The patient voices understanding of current disease status and treatment options and is in agreement with the current care plan. All questions were answered. The patient knows to call the clinic with any problems, questions or concerns. We can certainly see the patient much sooner if necessary    No orders of the defined types were placed in this encounter.    Smantha Boakye L Verlaine Embry, PA-C 03/30/23   ADDENDUM: Hematology/oncology Attending:  I had a face-to-face encounter with the patient today.  I reviewed his record, lab and recommended his care plan.  This is a very pleasant 77 years old white male with history of metastatic poorly differentiated carcinoma, neuroendocrine carcinoma in addition to history of prostate adenocarcinoma.  This was diagnosed in January 2024.  He has prostate adenocarcinoma is currently managed by his urologist Dr. Mena Goes with Roosvelt Maser and Eligard. For the neuroendocrine carcinoma of the lung the patient started systemic therapy with carboplatin, etoposide and Imfinzi for 4 cycles with initial good response.  He is currently on maintenance treatment with Imfinzi every 4 weeks status post 3 more cycles.  He has been doing fine and tolerating his treatment well but last week he was noted to have significant elevation of his liver enzymes.  This was suspicious to be secondary to immunotherapy.  He was treated with a short course of steroid with Medrol Dosepak to see if there is any improvement in his condition but repeat liver enzymes today showed persistent elevation of the LFTs. I recommended for the patient to start high-dose prednisone 1 Mg/KG to be tapered slowly over the next 4-5 weeks the patient also has worsening rash on the left cheek that has been going on for 1-2 months but getting worse recently.  He was seen by his primary care physician and was given a course of antibiotic for suspicious  cellulitis but the rash is more erythematous and demarcated.  He is also seeing his ophthalmologist next week. I recommended for the patient to hold any further treatment of immunotherapy for now and we will start him on the taper dose of prednisone. He will come back for follow-up visit in 2-3 weeks for reevaluation and repeat comprehensive metabolic panel. He will have repeat CT scan of the chest, abdomen and pelvis in around 1 months with follow-up visit after the scan for restaging of his disease. The patient was advised to call immediately if he has any other concerning symptoms in the interval. The total time spent in the appointment was 30 minutes. Disclaimer: This note was dictated with voice recognition software. Similar sounding words can inadvertently be transcribed and may be missed upon review. Lajuana Matte, MD

## 2023-03-23 NOTE — Progress Notes (Signed)
Per C. Heilingoeter NP, patient will have infusion today due to elevated AST & ALT, port deacessed.

## 2023-03-24 ENCOUNTER — Encounter: Payer: Medicare Other | Admitting: *Deleted

## 2023-03-24 DIAGNOSIS — Z955 Presence of coronary angioplasty implant and graft: Secondary | ICD-10-CM | POA: Diagnosis not present

## 2023-03-24 DIAGNOSIS — I252 Old myocardial infarction: Secondary | ICD-10-CM | POA: Diagnosis not present

## 2023-03-24 DIAGNOSIS — Z5189 Encounter for other specified aftercare: Secondary | ICD-10-CM | POA: Diagnosis not present

## 2023-03-24 DIAGNOSIS — I213 ST elevation (STEMI) myocardial infarction of unspecified site: Secondary | ICD-10-CM

## 2023-03-24 NOTE — Progress Notes (Signed)
Daily Session Note  Patient Details  Name: Larry Kent MRN: [id number] Date of Birth: 04-14-1946 Referring Provider:   Flowsheet Row Cardiac Rehab from 12/01/2022 in [facility] Cardiac [facility]monary Rehab  Referring Provider Dr. Lorine Bears, MD       Encounter Date: 03/24/2023  Check In:  Session Check In - 03/24/23 1137       Check-In   Supervising physician immediately available to respond to emergencies See telemetry face sheet for immediately available ER MD    Location [facility]-Cardiac & Pulmonary Rehab    Staff Present Bess Kinds RN, BSN;Laureen Manson Passey, BS, RRT, CPFT;Maxon Conetta BS, , Exercise Physiologist;Quitman Norberto Katrinka Blazing, RN, ADN    Virtual Visit No    Medication changes reported     No    Fall or balance concerns reported    No    Warm-up and Cool-down Performed on first and last piece of equipment    Resistance Training Performed Yes    VAD Patient? No    PAD/SET Patient? No      Pain Assessment   Currently in Pain? No/denies                Social History   Tobacco Use  Smoking Status Never  Smokeless Tobacco Never    Goals Met:  Independence with exercise equipment Exercise tolerated well No report of concerns or symptoms today Strength training completed today  Goals Unmet:  Not Applicable  Comments: Pt able to follow exercise prescription today without complaint.  Will continue to monitor for progression.    Dr. Bethann Punches is Medical Director for [facility] Cardiac Rehabilitation.  Dr. Vida Rigger is Medical Director for [facility] Pulmonary Rehabilitation.

## 2023-03-29 ENCOUNTER — Encounter: Payer: Medicare Other | Admitting: *Deleted

## 2023-03-29 ENCOUNTER — Ambulatory Visit (INDEPENDENT_AMBULATORY_CARE_PROVIDER_SITE_OTHER): Payer: Medicare Other | Admitting: Family Medicine

## 2023-03-29 ENCOUNTER — Encounter: Payer: Self-pay | Admitting: Family Medicine

## 2023-03-29 VITALS — BP 124/68 | HR 80 | Temp 97.0°F | Ht 67.5 in | Wt 186.0 lb

## 2023-03-29 DIAGNOSIS — R21 Rash and other nonspecific skin eruption: Secondary | ICD-10-CM | POA: Diagnosis not present

## 2023-03-29 DIAGNOSIS — Z955 Presence of coronary angioplasty implant and graft: Secondary | ICD-10-CM | POA: Diagnosis not present

## 2023-03-29 DIAGNOSIS — Z5189 Encounter for other specified aftercare: Secondary | ICD-10-CM | POA: Diagnosis not present

## 2023-03-29 DIAGNOSIS — I252 Old myocardial infarction: Secondary | ICD-10-CM | POA: Diagnosis not present

## 2023-03-29 DIAGNOSIS — I213 ST elevation (STEMI) myocardial infarction of unspecified site: Secondary | ICD-10-CM

## 2023-03-29 MED ORDER — CEPHALEXIN 500 MG PO CAPS: 500.0000 mg | ORAL_CAPSULE | Freq: Three times a day (TID) | ORAL | 0 refills | Status: DC

## 2023-03-29 MED ORDER — VALACYCLOVIR HCL 1 G PO TABS: 1000.0000 mg | ORAL_TABLET | Freq: Three times a day (TID) | ORAL | 0 refills | Status: DC

## 2023-03-29 NOTE — Progress Notes (Signed)
Daily Session Note  Patient Details  Name: Larry Kent MRN: [id number] Date of Birth: 1945-08-08 Referring Provider:   Flowsheet Row Cardiac Rehab from 12/01/2022 in [facility] Cardiac and Pulmonary Rehab  Referring Provider Dr. Lorine Bears, MD       Encounter Date: 03/29/2023  Check In:  Session Check In - 03/29/23 1124       Check-In   Supervising physician immediately available to respond to emergencies See telemetry face sheet for immediately available ER MD    Location [facility]-Cardiac & Pulmonary Rehab    Staff Present Rory Percy, MS, Exercise Physiologist;Maxon Suzzette Righter, , Exercise Physiologist;Kelly Madilyn Fireman, BS, ACSM CEP, Exercise Physiologist;Pilar Corrales Katrinka Blazing, RN, ADN    Virtual Visit No    Medication changes reported     No    Fall or balance concerns reported    No    Warm-up and Cool-down Performed on first and last piece of equipment    Resistance Training Performed Yes    VAD Patient? No    PAD/SET Patient? No      Pain Assessment   Currently in Pain? No/denies                Social History   Tobacco Use  Smoking Status Never  Smokeless Tobacco Never    Goals Met:  Independence with exercise equipment Exercise tolerated well Personal goals reviewed No report of concerns or symptoms today Strength training completed today  Goals Unmet:  Not Applicable  Comments: Pt able to follow exercise prescription today without complaint.  Will continue to monitor for progression.    Dr. Bethann Punches is Medical Director for [facility] Cardiac Rehabilitation.  Dr. Vida Rigger is Medical Director for [facility] Pulmonary Rehabilitation.

## 2023-03-29 NOTE — Assessment & Plan Note (Signed)
Acute rash left face in setting of compromised patient, no concerning for possible herpes zoster although appearance is atypical.  Also some amount of concern for cellulitis.  He also did have recent sun exposure and use of medication to treat actinic keratoses so skin change may be allergic dermatitis. Will treat with valacyclovir 3 times daily for 7 days to cover possibility of zoster, treat with Keflex 500 mg p.o. 3 times daily x 7 days to cover for possible bacterial cellulitis. He has upcoming follow-up with oncology for resolution of immunotherapy, he will discuss with them whether this is a possible side effect of this medication.  He also plans to restart prednisone which should cover any allergic dermatitis.  No red flags.  Return and ER precautions provided.

## 2023-03-29 NOTE — Progress Notes (Signed)
Patient ID: Larry Kent, male    DOB: 1946-05-01, 77 y.o.   MRN: [id number]  This visit was conducted in person.  BP 124/68   Pulse 80   Temp (!) 97 F (36.1 C) (Temporal)   Ht 5' 7.5" (1.715 m)   Wt 186 lb (84.4 kg)   SpO2 98%   BMI 28.70 kg/m    CC:  Chief Complaint  Patient presents with   Rash    Rash on left side of face since 09/18. Has progressively gotten worse.     Subjective:   HPI: Larry Kent is a 77 y.o. male patient of Dr. Sharen Hones with complicated medical history presenting on 03/29/2023 for Rash (Rash on left side of face since 09/18. Has progressively gotten worse. )   New onset rash on left face  in last 10 days.  Recently at the beach. Redness spreading, soreness.  Mildly  itchy.    No new exposures, no lotion.Marland Kitchen tried applying topical conrtisone cream.  2 weeks ago did treat AK on left face with  topical me x 1-2 days... did this 2 days before going to beach, not in sun a whole lot.   He is immunocompromised.  Recent completion of 4 months of chemo for neuroendocrine carcinoma with lung nodules and advanced prostate cancer... had immunotherapy last week.. elevated given increased LFTs... treated with prednisone. AST 201 ALT 391 HAs lab re-eval planned tomorrow.      Relevant past medical, surgical, family and social history reviewed and updated as indicated. Interim medical history since our last visit reviewed. Allergies and medications reviewed and updated. Outpatient Medications Prior to Visit  Medication Sig Dispense Refill   abiraterone acetate (ZYTIGA) 250 MG tablet Take 1,000 mg by mouth daily.     acetaminophen (TYLENOL) 500 MG tablet Take 1,000 mg by mouth every 6 (six) hours as needed for mild pain or headache.     Calcium Carb-Cholecalciferol (RA CALCIUM PLUS VITAMIN D) 600-10 MG-MCG TABS Take 600 mg by mouth daily.     fluticasone (FLONASE) 50 MCG/ACT nasal spray Place 1 spray into both nostrils daily as needed for allergies or  rhinitis.     lidocaine-prilocaine (EMLA) cream Apply to the Port-A-Cath site 30-60 minutes before chemotherapy treatment (Patient taking differently: Apply 1 Application topically See admin instructions. Apply to the Port-A-Cath site 30-60 minutes before chemotherapy treatment) 30 g 0   metoprolol tartrate (LOPRESSOR) 25 MG tablet Take 0.5 tablets (12.5 mg total) by mouth 2 (two) times daily. 30 tablet 11   Multiple Vitamin (MULTIVITAMIN) tablet Take 1 tablet by mouth daily with breakfast.     predniSONE (DELTASONE) 5 MG tablet Take 5 mg by mouth 2 (two) times daily.     prochlorperazine (COMPAZINE) 10 MG tablet Take 1 tablet (10 mg total) by mouth every 6 (six) hours as needed for nausea or vomiting. 30 tablet 0   rosuvastatin (CRESTOR) 20 MG tablet Take 1 tablet (20 mg total) by mouth daily. 90 tablet 4   tamsulosin (FLOMAX) 0.4 MG CAPS capsule Take 0.4 mg by mouth 2 (two) times daily.     ticagrelor (BRILINTA) 90 MG TABS tablet Take 1 tablet (90 mg total) by mouth 2 (two) times daily. 60 tablet 11   Zinc 25 MG TABS Take 25 mg by mouth daily.     methylPREDNISolone (MEDROL DOSEPAK) 4 MG TBPK tablet Use as instructed (Patient not taking: Reported on 03/29/2023) 21 tablet 0   nitrofurantoin (MACRODANTIN) 50 MG  capsule Take 1 capsule (50 mg total) by mouth 2 (two) times daily. (Patient not taking: Reported on 03/29/2023) 10 capsule 0   No facility-administered medications prior to visit.     Per HPI unless specifically indicated in ROS section below Review of Systems  Constitutional:  Negative for fatigue and fever.  HENT:  Negative for ear pain.   Eyes:  Negative for pain.  Respiratory:  Negative for cough and shortness of breath.   Cardiovascular:  Negative for chest pain, palpitations and leg swelling.  Gastrointestinal:  Negative for abdominal pain.  Genitourinary:  Negative for dysuria.  Musculoskeletal:  Negative for arthralgias.  Skin:  Positive for rash.  Neurological:  Negative for  syncope, light-headedness and headaches.  Psychiatric/Behavioral:  Negative for dysphoric mood.    Objective:  BP 124/68   Pulse 80   Temp (!) 97 F (36.1 C) (Temporal)   Ht 5' 7.5" (1.715 m)   Wt 186 lb (84.4 kg)   SpO2 98%   BMI 28.70 kg/m   Wt Readings from Last 3 Encounters:  03/29/23 186 lb (84.4 kg)  03/22/23 188 lb (85.3 kg)  02/23/23 187 lb 4.8 oz (85 kg)      Physical Exam Constitutional:      Appearance: He is well-developed.  HENT:     Head: Normocephalic.     Right Ear: Hearing normal.     Left Ear: Hearing normal.     Nose: Nose normal.  Neck:     Thyroid: No thyroid mass or thyromegaly.     Vascular: No carotid bruit.     Trachea: Trachea normal.  Cardiovascular:     Rate and Rhythm: Normal rate and regular rhythm.     Pulses: Normal pulses.     Heart sounds: Heart sounds not distant. No murmur heard.    No friction rub. No gallop.     Comments: No peripheral edema Pulmonary:     Effort: Pulmonary effort is normal. No respiratory distress.     Breath sounds: Normal breath sounds.  Skin:    General: Skin is warm and dry.     Findings: Rash present.     Comments: See picture  Psychiatric:        Speech: Speech normal.        Behavior: Behavior normal.        Thought Content: Thought content normal.       Results for orders placed or performed in visit on 03/23/23  CBC with Differential (Cancer Center Only)  Result Value Ref Range   WBC Count 7.1 4.0 - 10.5 K/uL   RBC 3.73 (L) 4.22 - 5.81 MIL/uL   Hemoglobin 12.5 (L) 13.0 - 17.0 g/dL   HCT 16.1 (L) 09.6 - 04.5 %   MCV 98.7 80.0 - 100.0 fL   MCH 33.5 26.0 - 34.0 pg   MCHC 34.0 30.0 - 36.0 g/dL   RDW 40.9 81.1 - 91.4 %   Platelet Count 142 (L) 150 - 400 K/uL   nRBC 0.0 0.0 - 0.2 %   Neutrophils Relative % 72 %   Neutro Abs 5.2 1.7 - 7.7 K/uL   Lymphocytes Relative 15 %   Lymphs Abs 1.0 0.7 - 4.0 K/uL   Monocytes Relative 11 %   Monocytes Absolute 0.8 0.1 - 1.0 K/uL   Eosinophils Relative  1 %   Eosinophils Absolute 0.1 0.0 - 0.5 K/uL   Basophils Relative 1 %   Basophils Absolute 0.1 0.0 - 0.1 K/uL  Immature Granulocytes 0 %   Abs Immature Granulocytes 0.03 0.00 - 0.07 K/uL  CMP (Cancer Center only)  Result Value Ref Range   Sodium 137 135 - 145 mmol/L   Potassium 3.9 3.5 - 5.1 mmol/L   Chloride 106 98 - 111 mmol/L   CO2 26 22 - 32 mmol/L   Glucose, Bld 192 (H) 70 - 99 mg/dL   BUN 23 8 - 23 mg/dL   Creatinine 4.09 8.11 - 1.24 mg/dL   Calcium 9.2 8.9 - 91.4 mg/dL   Total Protein 6.8 6.5 - 8.1 g/dL   Albumin 3.9 3.5 - 5.0 g/dL   AST 782 (HH) 15 - 41 U/L   ALT 391 (HH) 0 - 44 U/L   Alkaline Phosphatase 160 (H) 38 - 126 U/L   Total Bilirubin 1.0 0.3 - 1.2 mg/dL   GFR, Estimated >95 >62 mL/min   Anion gap 5 5 - 15  Sample to Blood Bank  Result Value Ref Range   Blood Bank Specimen SAMPLE AVAILABLE FOR TESTING    Sample Expiration      03/26/2023,2359 Performed at [facility], [address] [place], [place] [address]     Assessment and Plan  Facial rash Assessment & Plan: Acute rash left face in setting of compromised patient, no concerning for possible herpes zoster although appearance is atypical.  Also some amount of concern for cellulitis.  He also did have recent sun exposure and use of medication to treat actinic keratoses so skin change may be allergic dermatitis. Will treat with valacyclovir 3 times daily for 7 days to cover possibility of zoster, treat with Keflex 500 mg p.o. 3 times daily x 7 days to cover for possible bacterial cellulitis. He has upcoming follow-up with oncology for resolution of immunotherapy, he will discuss with them whether this is a possible side effect of this medication.  He also plans to restart prednisone which should cover any allergic dermatitis.  No red flags.  Return and ER precautions provided.   Other orders -     valACYclovir HCl; Take 1 tablet (1,000 mg total) by mouth 3 (three) times daily.   Dispense: 21 tablet; Refill: 0 -     Cephalexin; Take 1 capsule (500 mg total) by mouth 3 (three) times daily.  Dispense: 21 capsule; Refill: 0    No follow-ups on file.   Kerby Nora, MD

## 2023-03-30 ENCOUNTER — Inpatient Hospital Stay: Payer: Medicare Other | Attending: Nurse Practitioner | Admitting: Physician Assistant

## 2023-03-30 ENCOUNTER — Other Ambulatory Visit: Payer: Self-pay

## 2023-03-30 ENCOUNTER — Encounter: Payer: Self-pay | Admitting: Internal Medicine

## 2023-03-30 ENCOUNTER — Other Ambulatory Visit: Payer: Medicare Other

## 2023-03-30 ENCOUNTER — Encounter: Payer: Self-pay | Admitting: Physician Assistant

## 2023-03-30 ENCOUNTER — Inpatient Hospital Stay: Payer: Medicare Other | Attending: Nurse Practitioner

## 2023-03-30 ENCOUNTER — Inpatient Hospital Stay: Payer: Medicare Other

## 2023-03-30 ENCOUNTER — Ambulatory Visit: Payer: Medicare Other

## 2023-03-30 ENCOUNTER — Ambulatory Visit: Payer: Medicare Other | Admitting: Internal Medicine

## 2023-03-30 VITALS — BP 118/64 | HR 95 | Temp 98.3°F | Resp 17 | Wt 187.0 lb

## 2023-03-30 DIAGNOSIS — Z5112 Encounter for antineoplastic immunotherapy: Secondary | ICD-10-CM

## 2023-03-30 DIAGNOSIS — Z79899 Other long term (current) drug therapy: Secondary | ICD-10-CM | POA: Diagnosis not present

## 2023-03-30 DIAGNOSIS — C3490 Malignant neoplasm of unspecified part of unspecified bronchus or lung: Secondary | ICD-10-CM

## 2023-03-30 DIAGNOSIS — C61 Malignant neoplasm of prostate: Secondary | ICD-10-CM | POA: Insufficient documentation

## 2023-03-30 DIAGNOSIS — C7A1 Malignant poorly differentiated neuroendocrine tumors: Secondary | ICD-10-CM | POA: Diagnosis not present

## 2023-03-30 DIAGNOSIS — K716 Toxic liver disease with hepatitis, not elsewhere classified: Secondary | ICD-10-CM

## 2023-03-30 DIAGNOSIS — Z960 Presence of urogenital implants: Secondary | ICD-10-CM

## 2023-03-30 DIAGNOSIS — T50905A Adverse effect of unspecified drugs, medicaments and biological substances, initial encounter: Secondary | ICD-10-CM | POA: Diagnosis not present

## 2023-03-30 DIAGNOSIS — C7B8 Other secondary neuroendocrine tumors: Secondary | ICD-10-CM | POA: Diagnosis not present

## 2023-03-30 DIAGNOSIS — R7989 Other specified abnormal findings of blood chemistry: Secondary | ICD-10-CM | POA: Diagnosis not present

## 2023-03-30 DIAGNOSIS — R21 Rash and other nonspecific skin eruption: Secondary | ICD-10-CM

## 2023-03-30 LAB — CMP (CANCER CENTER ONLY)
ALT: 334 U/L (ref 0–44)
AST: 139 U/L — ABNORMAL HIGH (ref 15–41)
Albumin: 3.9 g/dL (ref 3.5–5.0)
Alkaline Phosphatase: 132 U/L — ABNORMAL HIGH (ref 38–126)
Anion gap: 7 (ref 5–15)
BUN: 26 mg/dL — ABNORMAL HIGH (ref 8–23)
CO2: 25 mmol/L (ref 22–32)
Calcium: 9.4 mg/dL (ref 8.9–10.3)
Chloride: 102 mmol/L (ref 98–111)
Creatinine: 0.94 mg/dL (ref 0.61–1.24)
GFR, Estimated: >60 mL/min (ref >60)
Glucose, Bld: 196 mg/dL — ABNORMAL HIGH (ref 70–99)
Potassium: 3.7 mmol/L (ref 3.5–5.1)
Sodium: 134 mmol/L — ABNORMAL LOW (ref 135–145)
Total Bilirubin: 1 mg/dL (ref 0.3–1.2)
Total Protein: 6.8 g/dL (ref 6.5–8.1)

## 2023-03-30 LAB — CBC WITH DIFFERENTIAL (CANCER CENTER ONLY)
Abs Immature Granulocytes: 0.15 10*3/uL — ABNORMAL HIGH (ref 0.00–0.07)
Basophils Absolute: 0 10*3/uL (ref 0.0–0.1)
Basophils Relative: 0 %
Eosinophils Absolute: 0.1 10*3/uL (ref 0.0–0.5)
Eosinophils Relative: 1 %
HCT: 40.4 % (ref 39.0–52.0)
Hemoglobin: 13.9 g/dL (ref 13.0–17.0)
Immature Granulocytes: 2 %
Lymphocytes Relative: 15 %
Lymphs Abs: 1.5 10*3/uL (ref 0.7–4.0)
MCH: 33.6 pg (ref 26.0–34.0)
MCHC: 34.4 g/dL (ref 30.0–36.0)
MCV: 97.6 fL (ref 80.0–100.0)
Monocytes Absolute: 1 10*3/uL (ref 0.1–1.0)
Monocytes Relative: 10 %
Neutro Abs: 7.4 10*3/uL (ref 1.7–7.7)
Neutrophils Relative %: 72 %
Platelet Count: 159 10*3/uL (ref 150–400)
RBC: 4.14 MIL/uL — ABNORMAL LOW (ref 4.22–5.81)
RDW: 14.6 % (ref 11.5–15.5)
WBC Count: 10.1 10*3/uL (ref 4.0–10.5)
nRBC: 0 % (ref 0.0–0.2)

## 2023-03-30 LAB — SAMPLE TO BLOOD BANK

## 2023-03-30 MED ORDER — SODIUM CHLORIDE 0.9% FLUSH
10.0000 mL | Freq: Once | INTRAVENOUS | Status: AC
  Administered 2023-03-30: 10 mL

## 2023-03-30 MED ORDER — OMEPRAZOLE 20 MG PO CPDR
20.0000 mg | DELAYED_RELEASE_CAPSULE | Freq: Every day | ORAL | 0 refills | Status: DC
Start: 2023-03-30 — End: 2023-04-21

## 2023-03-30 MED ORDER — PREDNISONE 20 MG PO TABS
ORAL_TABLET | ORAL | 0 refills | Status: DC
Start: 2023-03-30 — End: 2023-06-03

## 2023-03-30 MED ORDER — PREDNISONE 20 MG PO TABS: ORAL_TABLET | ORAL | 0 refills | Status: DC

## 2023-03-30 NOTE — Progress Notes (Signed)
   CRITICAL VALUE STICKER  CRITICAL VALUE: ALT 334  RECEIVER (on-site recipient of call): Jonte Wollam V  DATE & TIME NOTIFIED: 03/30/2023 1450  MESSENGER (representative from lab):  MD NOTIFIED: Cassie H  TIME OF NOTIFICATION:  RESPONSE:  Hold treatment

## 2023-03-31 ENCOUNTER — Encounter: Payer: Self-pay | Admitting: *Deleted

## 2023-03-31 ENCOUNTER — Encounter: Payer: Medicare Other | Attending: Cardiovascular Disease | Admitting: *Deleted

## 2023-03-31 DIAGNOSIS — I213 ST elevation (STEMI) myocardial infarction of unspecified site: Secondary | ICD-10-CM

## 2023-03-31 DIAGNOSIS — Z955 Presence of coronary angioplasty implant and graft: Secondary | ICD-10-CM | POA: Insufficient documentation

## 2023-03-31 DIAGNOSIS — Z48812 Encounter for surgical aftercare following surgery on the circulatory system: Secondary | ICD-10-CM | POA: Insufficient documentation

## 2023-03-31 DIAGNOSIS — I252 Old myocardial infarction: Secondary | ICD-10-CM | POA: Diagnosis not present

## 2023-03-31 NOTE — Progress Notes (Signed)
Cardiac Individual Treatment Plan  Patient Details  Name: Larry Kent MRN: [id number] Date of Birth: 05/12/1946 Referring Provider:   Flowsheet Row Cardiac Rehab from 12/01/2022 in [facility] Cardiac and Pulmonary Rehab  Referring Provider Dr. Lorine Bears, MD       Initial Encounter Date:  Flowsheet Row Cardiac Rehab from 12/01/2022 in [facility] Cardiac and Pulmonary Rehab  Date 12/01/22       Visit Diagnosis: Status post coronary artery stent placement  ST elevation myocardial infarction (STEMI), unspecified artery (HCC)  Patient's Home Medications on Admission:  Current Outpatient Medications:    abiraterone acetate (ZYTIGA) 250 MG tablet, Take 1,000 mg by mouth daily., Disp: , Rfl:    acetaminophen (TYLENOL) 500 MG tablet, Take 1,000 mg by mouth every 6 (six) hours as needed for mild pain or headache., Disp: , Rfl:    Calcium Carb-Cholecalciferol (RA CALCIUM PLUS VITAMIN D) 600-10 MG-MCG TABS, Take 600 mg by mouth daily., Disp: , Rfl:    cephALEXin (KEFLEX) 500 MG capsule, Take 1 capsule (500 mg total) by mouth 3 (three) times daily., Disp: 21 capsule, Rfl: 0   fluticasone (FLONASE) 50 MCG/ACT nasal spray, Place 1 spray into both nostrils daily as needed for allergies or rhinitis., Disp: , Rfl:    lidocaine-prilocaine (EMLA) cream, Apply to the Port-A-Cath site 30-60 minutes before chemotherapy treatment (Patient taking differently: Apply 1 Application topically See admin instructions. Apply to the Port-A-Cath site 30-60 minutes before chemotherapy treatment), Disp: 30 g, Rfl: 0   metoprolol tartrate (LOPRESSOR) 25 MG tablet, Take 0.5 tablets (12.5 mg total) by mouth 2 (two) times daily., Disp: 30 tablet, Rfl: 11   Multiple Vitamin (MULTIVITAMIN) tablet, Take 1 tablet by mouth daily with breakfast., Disp: , Rfl:    nitrofurantoin (MACRODANTIN) 50 MG capsule, Take 1 capsule (50 mg total) by mouth 2 (two) times daily., Disp: 10 capsule, Rfl: 0   omeprazole (PRILOSEC) 20 MG capsule, Take 1  capsule (20 mg total) by mouth daily., Disp: 30 capsule, Rfl: 0   predniSONE (DELTASONE) 20 MG tablet, Please take 4 tablets (80 mg) daily for 7 days, then take 3 tablets daily (60 mg) for 7 days, followed by 2 tablets daily (40 mg) daily for 7 days, followed by 1 tablet daily (20 mg) daily for 1 week, followed by 1/2 tablet for 7 days, then stop, Disp: 74 tablet, Rfl: 0   predniSONE (DELTASONE) 5 MG tablet, Take 5 mg by mouth 2 (two) times daily., Disp: , Rfl:    prochlorperazine (COMPAZINE) 10 MG tablet, Take 1 tablet (10 mg total) by mouth every 6 (six) hours as needed for nausea or vomiting., Disp: 30 tablet, Rfl: 0   rosuvastatin (CRESTOR) 20 MG tablet, Take 1 tablet (20 mg total) by mouth daily., Disp: 90 tablet, Rfl: 4   tamsulosin (FLOMAX) 0.4 MG CAPS capsule, Take 0.4 mg by mouth 2 (two) times daily., Disp: , Rfl:    ticagrelor (BRILINTA) 90 MG TABS tablet, Take 1 tablet (90 mg total) by mouth 2 (two) times daily., Disp: 60 tablet, Rfl: 11   valACYclovir (VALTREX) 1000 MG tablet, Take 1 tablet (1,000 mg total) by mouth 3 (three) times daily., Disp: 21 tablet, Rfl: 0   Zinc 25 MG TABS, Take 25 mg by mouth daily., Disp: , Rfl:   Past Medical History: Past Medical History:  Diagnosis Date   Allergy    Arthritis    both knees    BCC (basal cell carcinoma), arm, right 09/2019   MOHS (  [place])   BCC (basal cell carcinoma), face 2014   L preauricular s/p MOHS   BCC (basal cell carcinoma), face 02/2018   L upper lip   BPH (benign prostatic hypertrophy)    Dr. Mena Goes @ [facility]   Carotid stenosis 05/2015   RICA 40-59%, LICA 1-39%, L vertebral occlusion, rpt 1 yr   Cataract    removed both eyes    FHx: colon cancer    FHx: prostate cancer    Heart murmur    mild aortic stenosis    History of kidney stones    Hyperlipidemia    borderline- on rosuvastatin now normal    Occlusion of right vertebral artery 05/2015    Tobacco Use: Social History   Tobacco Use  Smoking Status  Never  Smokeless Tobacco Never    Labs: Review Flowsheet  More data exists      Latest Ref Rng & Units 09/23/2021 07/28/2022 08/21/2022 09/07/2022 12/22/2022  Labs for ITP Cardiac and Pulmonary Rehab  Cholestrol 0 - 200 mg/dL 161  - - 096  045   LDL (calc) 0 - 99 mg/dL 61  - - 64  64   HDL-C >39.00 mg/dL 40.98  - - 62  11.91   Trlycerides 0.0 - 149.0 mg/dL 47.8  - - 55  29.5   Hemoglobin A1c 4.6 - 6.5 % 6.1  - - 6.8  6.3   TCO2 22 - 32 mmol/L - 25  22  21   -    Details             Exercise Target Goals: Exercise Program Goal: Individual exercise prescription set using results from initial 6 min walk test and THRR while considering  patient's activity barriers and safety.   Exercise Prescription Goal: Initial exercise prescription builds to 30-45 minutes a day of aerobic activity, 2-3 days per week.  Home exercise guidelines will be given to patient during program as part of exercise prescription that the participant will acknowledge.   Education: Aerobic Exercise: - Group verbal and visual presentation on the components of exercise prescription. Introduces F.I.T.T principle from ACSM for exercise prescriptions.  Reviews F.I.T.T. principles of aerobic exercise including progression. Written material given at graduation. Flowsheet Row Cardiac Rehab from 12/01/2022 in [facility] Cardiac and Pulmonary Rehab  Education need identified 12/01/22       Education: Resistance Exercise: - Group verbal and visual presentation on the components of exercise prescription. Introduces F.I.T.T principle from ACSM for exercise prescriptions  Reviews F.I.T.T. principles of resistance exercise including progression. Written material given at graduation.    Education: Exercise & Equipment Safety: - Individual verbal instruction and demonstration of equipment use and safety with use of the equipment. Flowsheet Row Cardiac Rehab from 12/01/2022 in [facility] Cardiac and Pulmonary Rehab  Date 12/01/22  Educator  NT  Instruction Review Code 1- Verbalizes Understanding       Education: Exercise Physiology & General Exercise Guidelines: - Group verbal and written instruction with models to review the exercise physiology of the cardiovascular system and associated critical values. Provides general exercise guidelines with specific guidelines to those with heart or lung disease.    Education: Flexibility, Balance, Mind/Body Relaxation: - Group verbal and visual presentation with interactive activity on the components of exercise prescription. Introduces F.I.T.T principle from ACSM for exercise prescriptions. Reviews F.I.T.T. principles of flexibility and balance exercise training including progression. Also discusses the mind body connection.  Reviews various relaxation techniques to help reduce and manage stress (i.e. Deep breathing,  progressive muscle relaxation, and visualization). Balance handout provided to take home. Written material given at graduation.   Activity Barriers & Risk Stratification:  Activity Barriers & Cardiac Risk Stratification - 12/01/22 1451       Activity Barriers & Cardiac Risk Stratification   Activity Barriers Other (comment);Back Problems;Arthritis    Comments R knee pain    Cardiac Risk Stratification Moderate             6 Minute Walk:  6 Minute Walk     Row Name 12/01/22 1450 03/22/23 1128       6 Minute Walk   Phase Initial Discharge    Distance 1295 feet 1325 feet    Distance % Change -- 2.26 %    Distance Feet Change -- 30 ft    Walk Time 6 minutes 6 minutes    # of Rest Breaks 0 0    MPH 2.45 2.51    METS 2.5 2.6    RPE 7 12    Perceived Dyspnea  0 0    VO2 Peak 8.74 9.09    Symptoms No No    Resting HR 60 bpm 65 bpm    Resting BP 102/50 130/72    Resting Oxygen Saturation  97 % 96 %    Exercise Oxygen Saturation  during 6 min walk 99 % 95 %    Max Ex. HR 91 bpm 105 bpm    Max Ex. BP 144/66 140/66    2 Minute Post BP 122/62 116/64              Oxygen Initial Assessment:   Oxygen Re-Evaluation:   Oxygen Discharge (Final Oxygen Re-Evaluation):   Initial Exercise Prescription:  Initial Exercise Prescription - 12/01/22 1400       Date of Initial Exercise RX and Referring Provider   Date 12/01/22    Referring Provider Dr. Lorine Bears, MD      Oxygen   Maintain Oxygen Saturation 88% or higher      Treadmill   MPH 2    Grade 0    Minutes 15    METs 2.53      NuStep   Level 2    SPM 80    Minutes 15    METs 2.5      REL-XR   Level 2    Speed 50    Minutes 15    METs 2.5      T5 Nustep   Level 1    SPM 80    Minutes 15    METs 2.5      Prescription Details   Frequency (times per week) 3    Duration Progress to 30 minutes of continuous aerobic without signs/symptoms of physical distress      Intensity   THRR 40-80% of Max Heartrate 93-126    Ratings of Perceived Exertion 11-13    Perceived Dyspnea 0-4      Progression   Progression Continue to progress workloads to maintain intensity without signs/symptoms of physical distress.      Resistance Training   Training Prescription Yes    Weight 4 lb    Reps 10-15             Perform Capillary Blood Glucose checks as needed.  Exercise Prescription Changes:   Exercise Prescription Changes     Row Name 12/01/22 1400 12/07/22 1600 12/23/22 1400 01/20/23 1400 02/03/23 1500     Response to Exercise   Blood Pressure (Admit)  102/50 108/52 118/62 110/58 122/54   Blood Pressure (Exercise) 144/66 122/58 128/60 138/70 130/64   Blood Pressure (Exit) 122/62 110/58 138/64 110/56 96/58   Heart Rate (Admit) 60 bpm 64 bpm 61 bpm 68 bpm 81 bpm   Heart Rate (Exercise) 91 bpm 94 bpm 97 bpm 122 bpm 108 bpm   Heart Rate (Exit) 59 bpm 76 bpm 69 bpm 84 bpm 76 bpm   Oxygen Saturation (Admit) 97 % -- -- -- --   Oxygen Saturation (Exercise) 99 % -- -- -- --   Rating of Perceived Exertion (Exercise) 7 12 13 13 13    Perceived Dyspnea (Exercise) 0 -- --  -- --   Symptoms None none none -- none   Comments Results first full day of exercise -- -- --   Duration -- Progress to 30 minutes of  aerobic without signs/symptoms of physical distress Continue with 30 min of aerobic exercise without signs/symptoms of physical distress. Continue with 30 min of aerobic exercise without signs/symptoms of physical distress. Continue with 30 min of aerobic exercise without signs/symptoms of physical distress.   Intensity -- THRR unchanged THRR unchanged THRR unchanged THRR unchanged     Progression   Progression -- Continue to progress workloads to maintain intensity without signs/symptoms of physical distress. Continue to progress workloads to maintain intensity without signs/symptoms of physical distress. Continue to progress workloads to maintain intensity without signs/symptoms of physical distress. Continue to progress workloads to maintain intensity without signs/symptoms of physical distress.   Average METs -- 2.46 2.63 2.71 2.64     Resistance Training   Training Prescription -- Yes Yes Yes Yes   Weight -- 4 lb 4 lb 5lb 6lb   Reps -- 10-15 10-15 10-15 10-15     Interval Training   Interval Training -- No No No No     Treadmill   MPH -- 2 2 2  2.3   Grade -- 0 0 0 0.5   Minutes -- 15 15 15 15    METs -- 2.53 2.53 2.53 2.92     NuStep   Level -- 2 3 4  --   Minutes -- 15 15 15  --   METs -- 2.4 2.53 2.6 --     REL-XR   Level -- -- 2 4 4    Speed -- -- -- -- 50   Minutes -- -- 15 15 15    METs -- -- 2.5 -- --     T5 Nustep   Level -- -- -- 2 1   SPM -- -- -- -- 80   Minutes -- -- -- 15 15   METs -- -- -- 2.6 2.6     Oxygen   Maintain Oxygen Saturation -- 88% or higher 88% or higher 88% or higher 88% or higher    Row Name 02/16/23 1300 03/04/23 1600 03/16/23 1500         Response to Exercise   Blood Pressure (Admit) 104/60 100/56 116/60     Blood Pressure (Exit) 126/68 112/60 108/64     Heart Rate (Admit) 65 bpm 58 bpm 60 bpm      Heart Rate (Exercise) 123 bpm 97 bpm 90 bpm     Heart Rate (Exit) 70 bpm 64 bpm 90 bpm     Rating of Perceived Exertion (Exercise) 14 13 12      Symptoms none none none     Duration Continue with 30 min of aerobic exercise without signs/symptoms of physical distress. Continue with 30 min of  aerobic exercise without signs/symptoms of physical distress. Continue with 30 min of aerobic exercise without signs/symptoms of physical distress.     Intensity THRR unchanged THRR unchanged THRR unchanged       Progression   Progression Continue to progress workloads to maintain intensity without signs/symptoms of physical distress. Continue to progress workloads to maintain intensity without signs/symptoms of physical distress. Continue to progress workloads to maintain intensity without signs/symptoms of physical distress.     Average METs 2.75 3.21 2.52       Resistance Training   Training Prescription Yes Yes Yes     Weight 6lb 6 lb 6 lb     Reps 10-15 10-15 10-15       Interval Training   Interval Training No No No       Treadmill   MPH 2.1 2.1 2.4     Grade 3 5 0     Minutes 15 15 15      METs 3.48 4.06 2.84       NuStep   Level -- 4 --     Minutes -- 15 --     METs -- 2.8 --       REL-XR   Level 4 5 --     Minutes 15 15 --     METs -- 3.5 --       T5 Nustep   Level 4 3 4      Minutes 15 15 15      METs 3 2.5 2.2       Oxygen   Maintain Oxygen Saturation 88% or higher 88% or higher 88% or higher              Exercise Comments:   Exercise Comments     Row Name 12/07/22 1145           Exercise Comments First full day of exercise!  Patient was oriented to gym and equipment including functions, settings, policies, and procedures.  Patient's individual exercise prescription and treatment plan were reviewed.  All starting workloads were established based on the results of the 6 minute walk test done at initial orientation visit.  The plan for exercise progression was also  introduced and progression will be customized based on patient's performance and goals.                Exercise Goals and Review:   Exercise Goals     Row Name 12/01/22 1309             Exercise Goals   Increase Physical Activity Yes       Intervention Develop an individualized exercise prescription for aerobic and resistive training based on initial evaluation findings, risk stratification, comorbidities and participant's personal goals.;Provide advice, education, support and counseling about physical activity/exercise needs.       Expected Outcomes Long Term: Exercising regularly at least 3-5 days a week.;Long Term: Add in home exercise to make exercise part of routine and to increase amount of physical activity.;Short Term: Attend rehab on a regular basis to increase amount of physical activity.       Increase Strength and Stamina Yes       Intervention Develop an individualized exercise prescription for aerobic and resistive training based on initial evaluation findings, risk stratification, comorbidities and participant's personal goals.;Provide advice, education, support and counseling about physical activity/exercise needs.       Expected Outcomes Short Term: Increase workloads from initial exercise prescription for resistance, speed, and METs.;Short Term: Perform resistance  training exercises routinely during rehab and add in resistance training at home;Long Term: Improve cardiorespiratory fitness, muscular endurance and strength as measured by increased METs and functional capacity ( )       Able to understand and use rate of perceived exertion (RPE) scale Yes       Intervention Provide education and explanation on how to use RPE scale       Expected Outcomes Short Term: Able to use RPE daily in rehab to express subjective intensity level;Long Term:  Able to use RPE to guide intensity level when exercising independently       Able to understand and use Dyspnea scale Yes        Intervention Provide education and explanation on how to use Dyspnea scale       Expected Outcomes Short Term: Able to use Dyspnea scale daily in rehab to express subjective sense of shortness of breath during exertion;Long Term: Able to use Dyspnea scale to guide intensity level when exercising independently       Knowledge and understanding of Target Heart Rate Range (THRR) Yes       Intervention Provide education and explanation of THRR including how the numbers were predicted and where they are located for reference       Expected Outcomes Long Term: Able to use THRR to govern intensity when exercising independently;Short Term: Able to state/look up THRR;Short Term: Able to use daily as guideline for intensity in rehab       Able to check pulse independently Yes       Intervention Provide education and demonstration on how to check pulse in carotid and radial arteries.;Review the importance of being able to check your own pulse for safety during independent exercise       Expected Outcomes Short Term: Able to explain why pulse checking is important during independent exercise;Long Term: Able to check pulse independently and accurately       Understanding of Exercise Prescription Yes       Intervention Provide education, explanation, and written materials on patient's individual exercise prescription       Expected Outcomes Short Term: Able to explain program exercise prescription;Long Term: Able to explain home exercise prescription to exercise independently                Exercise Goals Re-Evaluation :  Exercise Goals Re-Evaluation     Row Name 12/07/22 1146 12/23/22 1442 01/20/23 1433 02/03/23 1510 02/16/23 1354     Exercise Goal Re-Evaluation   Exercise Goals Review Increase Physical Activity;Increase Strength and Stamina;Able to understand and use rate of perceived exertion (RPE) scale;Knowledge and understanding of Target Heart Rate Range (THRR);Able to check pulse  independently;Understanding of Exercise Prescription Increase Physical Activity;Increase Strength and Stamina;Understanding of Exercise Prescription Increase Physical Activity;Increase Strength and Stamina;Understanding of Exercise Prescription Increase Physical Activity;Increase Strength and Stamina;Understanding of Exercise Prescription Increase Physical Activity;Increase Strength and Stamina;Understanding of Exercise Prescription   Comments Reviewed RPE  and dyspnea scale, THR and program prescription with pt today.  Pt voiced understanding and was given a copy of goals to take home. Zoren is new to the program. He has increased his NUstep T4 from level 2 to level 3. He maintained the TM at 2 MPH 0%elevation and maintianed the REXR at level 2. He continues to use 4 lb hand weights.  Will continue to monitor his progress. Kees has increased several of his workloads with an RPE of 13.  TM 2/0to 2.2/0, T5Nustepup level 2 from  level 1, T4Nustep from level 3 to level 4 and REXR from level 2 to level 4.  Will continue to monitor exercise progression with goal of increased stamina and strength. Hoyte continues to do well in rehab. He increased his hand weights from 5 to 6lbs. He also tried an incline of 0.5% on the treadmill, but eliminated incline in later sessions. We will continue to monitor his progress in the program. Basir continues to do well in rehab. He recently increased his Treadmill grade from 0.5% to 3%. He also increased his level on the T5 nustep from 1 to 4. We will continue to monitor his progress in the program.   Expected Outcomes Short: Use RPE daily to regulate intensity.  Long: Follow program prescription in THR. STG COntinue to see exercise progression. LTG He has increased stamina and strength from his exercise progression [id number] workloads as tolerated with goal of increased stamina and strength.   LTG: Continued exercise progression as tolerated during program and after discharge.  Short: Try to add 0.5% incline or greater to his treadmill prescription again. Long: Continue exercise to improve strength and stamina. Short: Continue to follow current exercise prescription, and progressively increase workloads. Long: Continue exercise to improve strength and stamina.    Row Name 02/17/23 1718 03/04/23 1621 03/16/23 1511 03/29/23 1118       Exercise Goal Re-Evaluation   Exercise Goals Review Increase Physical Activity;Increase Strength and Stamina;Understanding of Exercise Prescription Increase Physical Activity;Increase Strength and Stamina;Understanding of Exercise Prescription Increase Physical Activity;Increase Strength and Stamina;Understanding of Exercise Prescription Increase Strength and Stamina;Understanding of Exercise Prescription    Comments Reviewed home exercise with pt today.  Pt plans to use his at home recumbent bike 2 days per week for exercise. He also plans to start utilizing his 5 lb hand weights with the help of the at home exercise packet provided to him. Reviewed THR, pulse, RPE, sign and symptoms, pulse oximetery and when to call 911 or MD.  Also discussed weather considerations and indoor options.  Pt voiced understanding. Rieley continues to do well in rehab. He recently added more incline to his treadmill workload up to 5% while maintaining a speed of 2.1 mph. He also improved to level 5 on the XR and stayed consistent at level 4 on the T4 nustep. He is also coming due for his post and will look to improve on it. We will continue to monitor his progress in the program. Teryl has only attended on session since the last review. He did keep his treadmill speed consistent at 2.4 mph and increased back up to level 4 on the T5 nustep. He also has continued to do well with 6 lb handweights for resistance training. He is due for his post and will look to improve on it. We will continue to monitor his progress in the program. Patient reports that he uses his RB  at home on off days of rehab. He reports that he is exercising about 5 days a week including at home and rehab exercise. He reports improvement in strengh. Progression guidelines were discussed with patients. He has been able to mow the grass some. He still wants to gaiin more strength to improve independence with ADL's. Interval training was discussed. He will graduate from cardiac rehab soon and plans to exercise independently at a community fitness center upon graduation from the program.    Expected Outcomes Short: Supplement rehab with at home exercise 2 times per week. Long: Continue exercise  to improve strength and stamina. Short: Improve on post . Long: Continue exercise to improve strength and stamina. Short: Improve on post . Long: Continue exercise to improve strength and stamina. Short: add interval training during cardiac rehab and graduate from Cardiac rehab.. Long: become independent with exercise routine.             Discharge Exercise Prescription (Final Exercise Prescription Changes):  Exercise Prescription Changes - 03/16/23 1500       Response to Exercise   Blood Pressure (Admit) 116/60    Blood Pressure (Exit) 108/64    Heart Rate (Admit) 60 bpm    Heart Rate (Exercise) 90 bpm    Heart Rate (Exit) 90 bpm    Rating of Perceived Exertion (Exercise) 12    Symptoms none    Duration Continue with 30 min of aerobic exercise without signs/symptoms of physical distress.    Intensity THRR unchanged      Progression   Progression Continue to progress workloads to maintain intensity without signs/symptoms of physical distress.    Average METs 2.52      Resistance Training   Training Prescription Yes    Weight 6 lb    Reps 10-15      Interval Training   Interval Training No      Treadmill   MPH 2.4    Grade 0    Minutes 15    METs 2.84      T5 Nustep   Level 4    Minutes 15    METs 2.2      Oxygen   Maintain Oxygen Saturation 88% or higher              Nutrition:  Target Goals: Understanding of nutrition guidelines, daily intake of sodium 1500mg , cholesterol 200mg , calories 30% from fat and 7% or less from saturated fats, daily to have 5 or more servings of fruits and vegetables.  Education: All About Nutrition: -Group instruction provided by verbal, written material, interactive activities, discussions, models, and posters to present general guidelines for heart healthy nutrition including fat, fiber, MyPlate, the role of sodium in heart healthy nutrition, utilization of the nutrition label, and utilization of this knowledge for meal planning. Follow up email sent as well. Written material given at graduation. Flowsheet Row Cardiac Rehab from 12/01/2022 in [facility] Cardiac and Pulmonary Rehab  Education need identified 12/01/22       Biometrics:  Pre Biometrics - 12/01/22 1455       Pre Biometrics   Height 5' 7.5" (1.715 m)    Weight 180 lb 3.2 oz (81.7 kg)    Waist Circumference 37.5 inches    Hip Circumference 39.5 inches    Waist to Hip Ratio 0.95 %    BMI (Calculated) 27.79    Single Leg Stand 12.7 seconds             Post Biometrics - 03/22/23 1130        Post  Biometrics   Height 5' 7.5" (1.715 m)    Weight 188 lb (85.3 kg)    Waist Circumference 41.5 inches    Hip Circumference 40 inches    Waist to Hip Ratio 1.04 %    BMI (Calculated) 28.99    Single Leg Stand 30 seconds             Nutrition Therapy Plan and Nutrition Goals:  Nutrition Therapy & Goals - 12/01/22 1426       Intervention Plan   Intervention Prescribe,  educate and counsel regarding individualized specific dietary modifications aiming towards targeted core components such as weight, hypertension, lipid management, diabetes, heart failure and other comorbidities.    Expected Outcomes Short Term Goal: Understand basic principles of dietary content, such as calories, fat, sodium, cholesterol and nutrients.;Short Term Goal: A plan has  been developed with personal nutrition goals set during dietitian appointment.;Long Term Goal: Adherence to prescribed nutrition plan.             Nutrition Assessments:  MEDIFICTS Score Key: >=70 Need to make dietary changes  40-70 Heart Healthy Diet <= 40 Therapeutic Level Cholesterol Diet  Flowsheet Row Cardiac Rehab from 03/24/2023 in [facility] Cardiac and Pulmonary Rehab  Picture Your Plate Total Score on Discharge 71      Picture Your Plate Scores: <40 Unhealthy dietary pattern with much room for improvement. 41-50 Dietary pattern unlikely to meet recommendations for good health and room for improvement. 51-60 More healthful dietary pattern, with some room for improvement.  >60 Healthy dietary pattern, although there may be some specific behaviors that could be improved.    Nutrition Goals Re-Evaluation:  Nutrition Goals Re-Evaluation     Row Name 01/27/23 1115 02/24/23 1152 03/29/23 1117         Goals   Current Weight 188 lb (85.3 kg) -- --     Nutrition Goal More protien options More protien options --     Comment Meet with RD. Patient had to cancel previous RD apt. He wants to reschedule but will let us know in the future when he can do it. He is currently very busy with caretaking for his wife and doctors appointments, so would like to wait a little while before sheduling with the RD. Patient rescheduled RD apt and plans to meet with RD next week for an individual consultation.     Expected Outcome Short: Make RD appointment. Long: adhere to a diet that pertains to him. Short: make an RD apt. Long: maintain a heart healthy diet. Short: make an RD apt. Long: maintain a heart healthy diet.              Nutrition Goals Discharge (Final Nutrition Goals Re-Evaluation):  Nutrition Goals Re-Evaluation - 03/29/23 1117       Goals   Comment Patient rescheduled RD apt and plans to meet with RD next week for an individual consultation.    Expected Outcome Short: make an  RD apt. Long: maintain a heart healthy diet.             Psychosocial: Target Goals: Acknowledge presence or absence of significant depression and/or stress, maximize coping skills, provide positive support system. Participant is able to verbalize types and ability to use techniques and skills needed for reducing stress and depression.   Education: Stress, Anxiety, and Depression - Group verbal and visual presentation to define topics covered.  Reviews how body is impacted by stress, anxiety, and depression.  Also discusses healthy ways to reduce stress and to treat/manage anxiety and depression.  Written material given at graduation.   Education: Sleep Hygiene -Provides group verbal and written instruction about how sleep can affect your health.  Define sleep hygiene, discuss sleep cycles and impact of sleep habits. Review good sleep hygiene tips.    Initial Review & Psychosocial Screening:  Initial Psych Review & Screening - 11/25/22 1010       Initial Review   Current issues with None Identified      Family Dynamics   Good Support  System? Yes   adult son/daughter and wife     Barriers   Psychosocial barriers to participate in program There are no identifiable barriers or psychosocial needs.      Screening Interventions   Interventions Encouraged to exercise;To provide support and resources with identified psychosocial needs;Provide feedback about the scores to participant    Expected Outcomes Short Term goal: Utilizing psychosocial counselor, staff and physician to assist with identification of specific Stressors or current issues interfering with healing process. Setting desired goal for each stressor or current issue identified.;Long Term Goal: Stressors or current issues are controlled or eliminated.;Short Term goal: Identification and review with participant of any Quality of Life or Depression concerns found by scoring the questionnaire.;Long Term goal: The participant  improves quality of Life and PHQ9 Scores as seen by post scores and/or verbalization of changes             Quality of Life Scores:   Quality of Life - 03/24/23 1125       Quality of Life   Select Quality of Life      Quality of Life Scores   Health/Function Post 23.6 %    Socioeconomic Post 29 %    Psych/Spiritual Post 24 %    Family Post 27.6 %    GLOBAL Post 25.27 %            Scores of 19 and below usually indicate a poorer quality of life in these areas.  A difference of  2-3 points is a clinically meaningful difference.  A difference of 2-3 points in the total score of the Quality of Life Index has been associated with significant improvement in overall quality of life, self-image, physical symptoms, and general health in studies assessing change in quality of life.  PHQ-9: Review Flowsheet  More data exists      03/24/2023 12/01/2022 10/06/2022 09/28/2022 09/30/2021  Depression screen PHQ 2/9  Decreased Interest 0 0 0 0 0  Down, Depressed, Hopeless 0 0 0 0 0  PHQ - 2 Score 0 0 0 0 0  Altered sleeping 1 0 - - -  Tired, decreased energy 0 1 - - -  Change in appetite 1 0 - - -  Feeling bad or failure about yourself  0 0 - - -  Trouble concentrating 0 1 - - -  Moving slowly or fidgety/restless 0 0 - - -  Suicidal thoughts 0 0 - - -  PHQ-9 Score 2 2 - - -  Difficult doing work/chores Not difficult at all Not difficult at all - - -    Details           Interpretation of Total Score  Total Score Depression Severity:  1-4 = Minimal depression, 5-9 = Mild depression, 10-14 = Moderate depression, 15-19 = Moderately severe depression, 20-27 = Severe depression   Psychosocial Evaluation and Intervention:  Psychosocial Evaluation - 11/25/22 1031       Psychosocial Evaluation & Interventions   Interventions Encouraged to exercise with the program and follow exercise prescription    Comments Cleve has no barriers to attending the program. He lives with his wife and  adult son. THey are his support along with his daughter.  He is undergoing chemo and is receiving a monthly dose. He continues to work Radio producer jobs. He wants to get back in shape he was in prior to his heart attack.  He is ready to get started    Expected Outcomes STG Attends  all scheduled sessions, Progresses with his exercise,following the guidelines.  LTG He continues with his exercise progression and following health guidelines    Continue Psychosocial Services  Follow up required by staff             Psychosocial Re-Evaluation:  Psychosocial Re-Evaluation     Row Name 01/27/23 1117 02/24/23 1154 03/29/23 1135         Psychosocial Re-Evaluation   Current issues with Current Stress Concerns;Current Anxiety/Panic Current Stress Concerns;Current Anxiety/Panic Current Stress Concerns;Current Anxiety/Panic     Comments Kayden has done his first round of chemo in February. His wife is going through anxiety with his immunotherapy and is creating anxiety for him. His health issues are the brunt of is stressors and anxiety. Dumas states his main stress is related to his health and caregiving of his wife. He does have his daughter and son as support as the family works together to manage these medical situations. He has been working on finding counceling and mental health support for his wife, which he is hopeful will help with stress and anxiety levels for her and the family. Patient report s no changes in mental health. He did state that he does not sleep well due to his enlarged prostate and prednisone. He is able to get a nap in the afternoon and feels rested thoughout the day.     Expected Outcomes Short: Continue to exercise regularly to support mental health and notify staff of any changes. Long: maintain mental health and well being through teaching of rehab or prescribed medications independently. Short: continue to work towards getting the mental health and counceling support he needs for  his wife to help manage her caregiving needs. Long: establish balance with self care and caregiving. Continue to attend cardiac rehab as a way to care for self and have a break from caregiving. Short: graduate from cardiac rehab Long: maintian good mental health routine.     Interventions Encouraged to attend Cardiac Rehabilitation for the exercise Encouraged to attend Cardiac Rehabilitation for the exercise Encouraged to attend Cardiac Rehabilitation for the exercise     Continue Psychosocial Services  Follow up required by staff Follow up required by staff No Follow up required  graduating              Psychosocial Discharge (Final Psychosocial Re-Evaluation):  Psychosocial Re-Evaluation - 03/29/23 1135       Psychosocial Re-Evaluation   Current issues with Current Stress Concerns;Current Anxiety/Panic    Comments Patient report s no changes in mental health. He did state that he does not sleep well due to his enlarged prostate and prednisone. He is able to get a nap in the afternoon and feels rested thoughout the day.    Expected Outcomes Short: graduate from cardiac rehab Long: maintian good mental health routine.    Interventions Encouraged to attend Cardiac Rehabilitation for the exercise    Continue Psychosocial Services  No Follow up required   graduating            Vocational Rehabilitation: Provide vocational rehab assistance to qualifying candidates.   Vocational Rehab Evaluation & Intervention:  Vocational Rehab - 11/25/22 1013       Initial Vocational Rehab Evaluation & Intervention   Assessment shows need for Vocational Rehabilitation No      Vocational Rehab Re-Evaulation   Comments working part time  was self employed             Education: Education Goals: Education classes  will be provided on a variety of topics geared toward better understanding of heart health and risk factor modification. Participant will state understanding/return demonstration of  topics presented as noted by education test scores.  Learning Barriers/Preferences:   General Cardiac Education Topics:  AED/CPR: - Group verbal and written instruction with the use of models to demonstrate the basic use of the AED with the basic ABC's of resuscitation.   Anatomy and Cardiac Procedures: - Group verbal and visual presentation and models provide information about basic cardiac anatomy and function. Reviews the testing methods done to diagnose heart disease and the outcomes of the test results. Describes the treatment choices: Medical Management, Angioplasty, or Coronary Bypass Surgery for treating various heart conditions including Myocardial Infarction, Angina, Valve Disease, and Cardiac Arrhythmias.  Written material given at graduation.   Medication Safety: - Group verbal and visual instruction to review commonly prescribed medications for heart and lung disease. Reviews the medication, class of the drug, and side effects. Includes the steps to properly store meds and maintain the prescription regimen.  Written material given at graduation.   Intimacy: - Group verbal instruction through game format to discuss how heart and lung disease can affect sexual intimacy. Written material given at graduation..   Know Your Numbers and Heart Failure: - Group verbal and visual instruction to discuss disease risk factors for cardiac and pulmonary disease and treatment options.  Reviews associated critical values for Overweight/Obesity, Hypertension, Cholesterol, and Diabetes.  Discusses basics of heart failure: signs/symptoms and treatments.  Introduces Heart Failure Zone chart for action plan for heart failure.  Written material given at graduation.   Infection Prevention: - Provides verbal and written material to individual with discussion of infection control including proper hand washing and proper equipment cleaning during exercise session. Flowsheet Row Cardiac Rehab from  12/01/2022 in [facility] Cardiac and Pulmonary Rehab  Date 12/01/22  Educator NT  Instruction Review Code 1- Verbalizes Understanding       Falls Prevention: - Provides verbal and written material to individual with discussion of falls prevention and safety. Flowsheet Row Cardiac Rehab from 12/01/2022 in [facility] Cardiac and Pulmonary Rehab  Date 11/25/22  Educator SB  Instruction Review Code 1- Verbalizes Understanding       Other: -Provides group and verbal instruction on various topics (see comments)   Knowledge Questionnaire Score:  Knowledge Questionnaire Score - 03/24/23 1123       Knowledge Questionnaire Score   Pre Score 25/26             Core Components/Risk Factors/Patient Goals at Admission:  Personal Goals and Risk Factors at Admission - 11/25/22 1012       Core Components/Risk Factors/Patient Goals on Admission    Weight Management Yes    Intervention Weight Management: Provide education and appropriate resources to help participant work on and attain dietary goals.;Weight Management: Develop a combined nutrition and exercise program designed to reach desired caloric intake, while maintaining appropriate intake of nutrient and fiber, sodium and fats, and appropriate energy expenditure required for the weight goal.    Admit Weight 175 lb (79.4 kg)    Goal Weight: Long Term 175 lb (79.4 kg)    Expected Outcomes Short Term: Continue to assess and modify interventions until short term weight is achieved;Long Term: Adherence to nutrition and physical activity/exercise program aimed toward attainment of established weight goal;Weight Maintenance: Understanding of the daily nutrition guidelines, which includes 25-35% calories from fat, 7% or less cal from saturated fats, less than  200mg  cholesterol, less than 1.5gm of sodium, & 5 or more servings of fruits and vegetables daily    Lipids Yes    Intervention Provide education and support for participant on nutrition &  aerobic/resistive exercise along with prescribed medications to achieve LDL 70mg , HDL >40mg .    Expected Outcomes Short Term: Participant states understanding of desired cholesterol values and is compliant with medications prescribed. Participant is following exercise prescription and nutrition guidelines.;Long Term: Cholesterol controlled with medications as prescribed, with individualized exercise RX and with personalized nutrition plan. Value goals: LDL < 70mg , HDL > 40 mg.             Education:Diabetes - Individual verbal and written instruction to review signs/symptoms of diabetes, desired ranges of glucose level fasting, after meals and with exercise. Acknowledge that pre and post exercise glucose checks will be done for 3 sessions at entry of program.   Core Components/Risk Factors/Patient Goals Review:   Goals and Risk Factor Review     Row Name 01/27/23 1119 02/24/23 1149 03/29/23 1138         Core Components/Risk Factors/Patient Goals Review   Personal Goals Review Weight Management/Obesity;Hypertension Lipids Weight Management/Obesity     Review [place] would like to lose some weight. His weight has gone up to 188 pounds since the start of the program. He would like to lost some weight. He wants to reach a weight goal of 180lb. His blood pressures have been good and is checking at home. His blood pressure was 134/58 today. Informed him to check BP at home to correlate with out readings. Patient verbalises understanding. Damaree reports that he is taking all his medication at prescibed. He is not having any trouble with with his meds and continues to work with his healthcare team to manage his medications. Eddison reports that he has gained some weight but that is due to being on long term prednisone. He take alll meds as prescribed and plans to continue to exercise at a community fitness center and at home upoon graduation to help continue to control cardiac risk factos.     Expected  Outcomes Short: Lose a few pounds in the next few weeks and check blood pressure at home. Long: maintain weight and blood pressure readings independently Short: continue to take all medications and work with healthcare team if any concerns arise. Long: continue to [facility] cardiac risk factors. Short: graduate from cardiac rehab. Long: continue to control cardiac risk factors with heart healthy lifestyle.              Core Components/Risk Factors/Patient Goals at Discharge (Final Review):   Goals and Risk Factor Review - 03/29/23 1138       Core Components/Risk Factors/Patient Goals Review   Personal Goals Review Weight Management/Obesity    Review Dontavian reports that he has gained some weight but that is due to being on long term prednisone. He take alll meds as prescribed and plans to continue to exercise at a community fitness center and at home upoon graduation to help continue to control cardiac risk factos.    Expected Outcomes Short: graduate from cardiac rehab. Long: continue to control cardiac risk factors with heart healthy lifestyle.             ITP Comments:  ITP Comments     Row Name 11/25/22 1028 12/01/22 1309 12/07/22 1144 12/09/22 0731 01/05/23 1359   ITP Comments Virtual orientation call completed today. he has an appointment on Date: 12/01/2022  for  EP eval and gym Orientation.  Documentation of diagnosis can be found in [facility] Date: 09/25/2022 . Completed and gym orientation. Initial ITP created and sent for review to Dr. Bethann Punches, Medical Director. First full day of exercise!  Patient was oriented to gym and equipment including functions, settings, policies, and procedures.  Patient's individual exercise prescription and treatment plan were reviewed.  All starting workloads were established based on the results of the 6 minute walk test done at initial orientation visit.  The plan for exercise progression was also introduced and progression will be customized based on  patient's performance and goals. 30 Day review completed. Medical Director ITP review done, changes made as directed, and signed approval by Medical Director.   new to program 30 Day review completed. Medical Director ITP review done, changes made as directed, and signed approval by Medical Director.    Row Name 02/03/23 1121 03/03/23 0931 03/31/23 0958       ITP Comments 30 Day review completed. Medical Director ITP review done, changes made as directed, and signed approval by Medical Director. 30 Day review completed. Medical Director ITP review done, changes made as directed, and signed approval by Medical Director. 30 Day review completed. Medical Director ITP review done, changes made as directed, and signed approval by Medical Director.              Comments:

## 2023-03-31 NOTE — Progress Notes (Signed)
Daily Session Note  Patient Details  Name: Larry Kent MRN: [id number] Date of Birth: 1946-04-28 Referring Provider:   Flowsheet Row Cardiac Rehab from 12/01/2022 in [facility] Cardiac and Pulmonary Rehab  Referring Provider Dr. Lorine Bears, MD       Encounter Date: 03/31/2023  Check In:  Session Check In - 03/31/23 1113       Check-In   Supervising physician immediately available to respond to emergencies See telemetry face sheet for immediately available ER MD    Location [facility]-Cardiac & Pulmonary Rehab    Staff Present Lanny Hurst, RN, ADN;Margaret Best, MS, Exercise Physiologist;[name], [place]    Virtual Visit No    Medication changes reported     No    Fall or balance concerns reported    No    Warm-up and Cool-down Performed on first and last piece of equipment    Resistance Training Performed Yes    VAD Patient? No    PAD/SET Patient? No      Pain Assessment   Currently in Pain? No/denies                Social History   Tobacco Use  Smoking Status Never  Smokeless Tobacco Never    Goals Met:  Independence with exercise equipment Exercise tolerated well No report of concerns or symptoms today Strength training completed today  Goals Unmet:  Not Applicable  Comments: Pt able to follow exercise prescription today without complaint.  Will continue to monitor for progression.    Dr. Bethann Punches is Medical Director for [facility] Cardiac Rehabilitation.  Dr. Vida Rigger is Medical Director for [facility] Pulmonary Rehabilitation.

## 2023-04-01 DIAGNOSIS — B029 Zoster without complications: Secondary | ICD-10-CM | POA: Diagnosis not present

## 2023-04-05 ENCOUNTER — Encounter: Payer: Medicare Other | Admitting: *Deleted

## 2023-04-05 DIAGNOSIS — Z955 Presence of coronary angioplasty implant and graft: Secondary | ICD-10-CM

## 2023-04-05 DIAGNOSIS — Z48812 Encounter for surgical aftercare following surgery on the circulatory system: Secondary | ICD-10-CM | POA: Diagnosis not present

## 2023-04-05 DIAGNOSIS — I213 ST elevation (STEMI) myocardial infarction of unspecified site: Secondary | ICD-10-CM

## 2023-04-05 DIAGNOSIS — I252 Old myocardial infarction: Secondary | ICD-10-CM | POA: Diagnosis not present

## 2023-04-05 NOTE — Progress Notes (Signed)
Assessment start time: 10:08 AM  Digestive issues/concerns: no known food allergies, no chewing or swallowing difficulties   24-hours Recall: B: cheerios, raisins, granola, coffee L: [place] and cheese wrap D: roasted chicken, snap pea, tomato little mayo Snack: nuts and raisns or 3-4oz of ice creams    Beverages: water (40oz), coffee Alcohol none Caffeine coffee  Supplements MVI,  Intake Patterns Tries to eat smaller more frequent meals.    Education r/t nutrition plan Patient drinking ~40oz of water. Has coffee most days. Takes calcium supplement with a history of anemia. Spoke to him about iron rich foods how to better absorb it and to take his calcium at different times than when intaking iron. Provided handout explaining interaction as well as list of iron rich foods outside of red meat. Educated on types of fats, sources, and how to read label. Also spoke about sodium and rinsing canned foods. He tries to do heart healthy food choices, but his wife also cooks, she has trouble remembering these changes and why they are trying to do them. He has taken the opportunity to do most of the cooking. Commended him on these changes and encouraged him to continue to strive for better cooking choices when able. He reports he has been snacking after dinner, often ice cream. Spoke about why he snacks, determined it was likely due to habit and boredom. Brainstormed several better snacks to try. Built out several meals and snacks with foods he likes and will eat.      Goal 1: Drink 64oz of water  Goal 2: When snacking, ask why, drink a glass of water Goal 3: Eat a protein at every meal and pair with a complex carb   End time 11:13 AM

## 2023-04-05 NOTE — Progress Notes (Signed)
Daily Session Note  Patient Details  Name: Larry Kent MRN: [id number] Date of Birth: 1946/06/29 Referring Provider:   Flowsheet Row Cardiac Rehab from 12/01/2022 in [facility] Cardiac and Pulmonary Rehab  Referring Provider Dr. Lorine Bears, MD       Encounter Date: 04/05/2023  Check In:  Session Check In - 04/05/23 1121       Check-In   Supervising physician immediately available to respond to emergencies See telemetry face sheet for immediately available ER MD    Location [facility]-Cardiac & Pulmonary Rehab    Staff Present Cora Collum, RN, BSN, CCRP;Margaret Best, MS, Exercise Physiologist;Maxon Conetta BS, , Exercise Physiologist;Kelly Madilyn Fireman, BS, ACSM CEP, Exercise Physiologist;Trulee Hamstra Katrinka Blazing, RN, ADN    Virtual Visit No    Medication changes reported     No    Fall or balance concerns reported    No    Warm-up and Cool-down Performed on first and last piece of equipment    Resistance Training Performed Yes    VAD Patient? No    PAD/SET Patient? No      Pain Assessment   Currently in Pain? No/denies                Social History   Tobacco Use  Smoking Status Never  Smokeless Tobacco Never    Goals Met:  Independence with exercise equipment Exercise tolerated well No report of concerns or symptoms today Strength training completed today  Goals Unmet:  Not Applicable  Comments: Pt able to follow exercise prescription today without complaint.  Will continue to monitor for progression.    Dr. Bethann Punches is Medical Director for [facility] Cardiac Rehabilitation.  Dr. Vida Rigger is Medical Director for [facility] Pulmonary Rehabilitation.

## 2023-04-07 ENCOUNTER — Encounter: Payer: Medicare Other | Admitting: *Deleted

## 2023-04-07 DIAGNOSIS — I213 ST elevation (STEMI) myocardial infarction of unspecified site: Secondary | ICD-10-CM

## 2023-04-07 DIAGNOSIS — Z48812 Encounter for surgical aftercare following surgery on the circulatory system: Secondary | ICD-10-CM | POA: Diagnosis not present

## 2023-04-07 DIAGNOSIS — Z955 Presence of coronary angioplasty implant and graft: Secondary | ICD-10-CM | POA: Diagnosis not present

## 2023-04-07 DIAGNOSIS — C778 Secondary and unspecified malignant neoplasm of lymph nodes of multiple regions: Secondary | ICD-10-CM | POA: Diagnosis not present

## 2023-04-07 DIAGNOSIS — I252 Old myocardial infarction: Secondary | ICD-10-CM | POA: Diagnosis not present

## 2023-04-07 NOTE — Progress Notes (Signed)
Discharge Summary:  Larry Kent  (DOB: May 02, 1946)  Larry Kent graduated today from  rehab with 36 sessions completed.  Details of the patient's exercise prescription and what He needs to do in order to continue the prescription and progress were discussed with patient.  Patient was given a copy of prescription and goals.  Patient verbalized understanding. [name] plans to continue to exercise by recumbent bike 2 days/week, yardwork, 5lb weights, and [employer] 1 day/week.   6 Minute Walk     Row Name 12/01/22 1450 03/22/23 1128       6 Minute Walk   Phase Initial Discharge    Distance 1295 feet 1325 feet    Distance % Change -- 2.26 %    Distance Feet Change -- 30 ft    Walk Time 6 minutes 6 minutes    # of Rest Breaks 0 0    MPH 2.45 2.51    METS 2.5 2.6    RPE 7 12    Perceived Dyspnea  0 0    VO2 Peak 8.74 9.09    Symptoms No No    Resting HR 60 bpm 65 bpm    Resting BP 102/50 130/72    Resting Oxygen Saturation  97 % 96 %    Exercise Oxygen Saturation  during 6 min walk 99 % 95 %    Max Ex. HR 91 bpm 105 bpm    Max Ex. BP 144/66 140/66    2 Minute Post BP 122/62 116/64

## 2023-04-07 NOTE — Progress Notes (Signed)
Daily Session Note  Patient Details  Name: Larry Kent MRN: [id number] Date of Birth: August 01, 1945 Referring Provider:   Flowsheet Row Cardiac Rehab from 12/01/2022 in [facility] Cardiac and Pulmonary Rehab  Referring Provider Dr. Lorine Bears, MD       Encounter Date: 04/07/2023  Check In:  Session Check In - 04/07/23 1119       Check-In   Supervising physician immediately available to respond to emergencies See telemetry face sheet for immediately available ER MD    Location [facility]-Cardiac & Pulmonary Rehab    Staff Present Larry Percy, MS, Exercise Physiologist;Larry Jewel Baize, RN BSN;Larry Kent, Larry Ferrari, RN, ADN    Virtual Visit No    Medication changes reported     No    Fall or balance concerns reported    No    Warm-up and Cool-down Performed on first and last piece of equipment    Resistance Training Performed Yes    VAD Patient? No    PAD/SET Patient? No      Pain Assessment   Currently in Pain? No/denies                Social History   Tobacco Use  Smoking Status Never  Smokeless Tobacco Never    Goals Met:  Independence with exercise equipment Exercise tolerated well No report of concerns or symptoms today Strength training completed today  Goals Unmet:  Not Applicable  Comments:  Larry Kent graduated today from  rehab with 36 sessions completed.  Details of the patient's exercise prescription and what He needs to do in order to continue the prescription and progress were discussed with patient.  Patient was given a copy of prescription and goals.  Patient verbalized understanding. [name] plans to continue to exercise by recumbent bike 2 days/week, yardwork, 5lb weights, and [employer] 1 day/week.    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  Larry Kent is Medical Director for [facility] Pulmonary Rehabilitation.

## 2023-04-07 NOTE — Progress Notes (Signed)
Cardiac Individual Treatment Plan  Patient Details  Name: Larry Kent MRN: [id number] Date of Birth: [birth year] Referring Provider:   Flowsheet Row Cardiac Rehab [facility] 12/01/[facility]  Referring Provider Dr. Lorine Bears, MD       Initial Encounter Date:  Flowsheet Row Cardiac Rehab from 12/01/2022 in [facility] Cardiac and Pulmonary Rehab  Date 12/01/22       Visit Diagnosis: Status post coronary artery stent placement  ST elevation myocardial infarction (STEMI), unspecified artery (HCC)  Patient's Home Medications on Admission:  Current Outpatient Medications:    abiraterone acetate (ZYTIGA) 250 MG tablet, Take 1,000 mg by mouth daily., Disp: , Rfl:    acetaminophen (TYLENOL) 500 MG tablet, Take 1,000 mg by mouth every 6 (six) hours as needed for mild pain or headache., Disp: , Rfl:    Calcium Carb-Cholecalciferol (RA CALCIUM PLUS VITAMIN D) 600-10 MG-MCG TABS, Take 600 mg by mouth daily., Disp: , Rfl:    cephALEXin (KEFLEX) 500 MG capsule, Take 1 capsule (500 mg total) by mouth 3 (three) times daily., Disp: 21 capsule, Rfl: 0   fluticasone (FLONASE) 50 MCG/ACT nasal spray, Place 1 spray into both nostrils daily as needed for allergies or rhinitis., Disp: , Rfl:    lidocaine-prilocaine (EMLA) cream, Apply to the Port-A-Cath site 30-60 minutes before chemotherapy treatment (Patient taking differently: Apply 1 Application topically See admin instructions. Apply to the Port-A-Cath site 30-60 minutes before chemotherapy treatment), Disp: 30 g, Rfl: 0   metoprolol tartrate (LOPRESSOR) 25 MG tablet, Take 0.5 tablets (12.5 mg total) by mouth 2 (two) times daily., Disp: 30 tablet, Rfl: 11   Multiple Vitamin (MULTIVITAMIN) tablet, Take 1 tablet by mouth daily with breakfast., Disp: , Rfl:    nitrofurantoin (MACRODANTIN) 50 MG capsule, Take 1 capsule (50 mg total) by mouth 2 (two) times daily., Disp: 10 capsule, Rfl: 0   omeprazole (PRILOSEC) 20 MG capsule, Take 1  capsule (20 mg total) by mouth daily., Disp: 30 capsule, Rfl: 0   predniSONE (DELTASONE) 20 MG tablet, Please take 4 tablets (80 mg) daily for 7 days, then take 3 tablets daily (60 mg) for 7 days, followed by 2 tablets daily (40 mg) daily for 7 days, followed by 1 tablet daily (20 mg) daily for 1 week, followed by 1/2 tablet for 7 days, then stop, Disp: 74 tablet, Rfl: 0   predniSONE (DELTASONE) 5 MG tablet, Take 5 mg by mouth 2 (two) times daily., Disp: , Rfl:    prochlorperazine (COMPAZINE) 10 MG tablet, Take 1 tablet (10 mg total) by mouth every 6 (six) hours as needed for nausea or vomiting., Disp: 30 tablet, Rfl: 0   rosuvastatin (CRESTOR) 20 MG tablet, Take 1 tablet (20 mg total) by mouth daily., Disp: 90 tablet, Rfl: 4   tamsulosin (FLOMAX) 0.4 MG CAPS capsule, Take 0.4 mg by mouth 2 (two) times daily., Disp: , Rfl:    ticagrelor (BRILINTA) 90 MG TABS tablet, Take 1 tablet (90 mg total) by mouth 2 (two) times daily., Disp: 60 tablet, Rfl: 11   valACYclovir (VALTREX) 1000 MG tablet, Take 1 tablet (1,000 mg total) by mouth 3 (three) times daily., Disp: 21 tablet, Rfl: 0   Zinc 25 MG TABS, Take 25 mg by mouth daily., Disp: , Rfl:   Past Medical History: Past Medical History:  Diagnosis Date   Allergy    Arthritis    both knees    BCC (basal cell carcinoma), arm, right 09/2019   MOHS (  [place])   BCC (basal cell carcinoma), face 2014   L preauricular s/p MOHS   BCC (basal cell carcinoma), face 02/2018   L upper lip   BPH (benign prostatic hypertrophy)    Dr. Mena Goes @ [facility]   Carotid stenosis 05/2015   RICA 40-59%, LICA 1-39%, L vertebral occlusion, rpt 1 yr   Cataract    removed both eyes    FHx: colon cancer    FHx: prostate cancer    Heart murmur    mild aortic stenosis    History of kidney stones    Hyperlipidemia    borderline- on rosuvastatin now normal    Occlusion of right vertebral artery 05/2015    Tobacco Use: Social History   Tobacco Use  Smoking Status  Never  Smokeless Tobacco Never    Labs: Review Flowsheet  More data exists      Latest Ref Rng & Units 09/23/2021 07/28/2022 08/21/2022 09/07/2022 12/22/2022  Labs for ITP Cardiac and Pulmonary Rehab  Cholestrol 0 - 200 mg/dL 161  - - 096  045   LDL (calc) 0 - 99 mg/dL 61  - - 64  64   HDL-C >39.00 mg/dL 40.98  - - 62  11.91   Trlycerides 0.0 - 149.0 mg/dL 47.8  - - 55  29.5   Hemoglobin A1c 4.6 - 6.5 % 6.1  - - 6.8  6.3   TCO2 22 - 32 mmol/L - 25  22  21   -    Details             Exercise Target Goals: Exercise Program Goal: Individual exercise prescription set using results from initial 6 min walk test and THRR while considering  patient's activity barriers and safety.   Exercise Prescription Goal: Initial exercise prescription builds to 30-45 minutes a day of aerobic activity, 2-3 days per week.  Home exercise guidelines will be given to patient during program as part of exercise prescription that the participant will acknowledge.   Education: Aerobic Exercise: - Group verbal and visual presentation on the components of exercise prescription. Introduces F.I.T.T principle from ACSM for exercise prescriptions.  Reviews F.I.T.T. principles of aerobic exercise including progression. Written material given at graduation. Flowsheet Row Cardiac Rehab from 12/01/2022 in [facility] Cardiac and Pulmonary Rehab  Education need identified 12/01/22       Education: Resistance Exercise: - Group verbal and visual presentation on the components of exercise prescription. Introduces F.I.T.T principle from ACSM for exercise prescriptions  Reviews F.I.T.T. principles of resistance exercise including progression. Written material given at graduation.    Education: Exercise & Equipment Safety: - Individual verbal instruction and demonstration of equipment use and safety with use of the equipment. Flowsheet Row Cardiac Rehab from 12/01/2022 in [facility] Cardiac and Pulmonary Rehab  Date 12/01/22  Educator  NT  Instruction Review Code 1- Verbalizes Understanding       Education: Exercise Physiology & General Exercise Guidelines: - Group verbal and written instruction with models to review the exercise physiology of the cardiovascular system and associated critical values. Provides general exercise guidelines with specific guidelines to those with heart or lung disease.    Education: Flexibility, Balance, Mind/Body Relaxation: - Group verbal and visual presentation with interactive activity on the components of exercise prescription. Introduces F.I.T.T principle from ACSM for exercise prescriptions. Reviews F.I.T.T. principles of flexibility and balance exercise training including progression. Also discusses the mind body connection.  Reviews various relaxation techniques to help reduce and manage stress (i.e. Deep breathing,  progressive muscle relaxation, and visualization). Balance handout provided to take home. Written material given at graduation.   Activity Barriers & Risk Stratification:  Activity Barriers & Cardiac Risk Stratification - 12/01/22 1451       Activity Barriers & Cardiac Risk Stratification   Activity Barriers Other (comment);Back Problems;Arthritis    Comments R knee pain    Cardiac Risk Stratification Moderate             6 Minute Walk:  6 Minute Walk     Row Name 12/01/22 1450 03/22/23 1128       6 Minute Walk   Phase Initial Discharge    Distance 1295 feet 1325 feet    Distance % Change -- 2.26 %    Distance Feet Change -- 30 ft    Walk Time 6 minutes 6 minutes    # of Rest Breaks 0 0    MPH 2.45 2.51    METS 2.5 2.6    RPE 7 12    Perceived Dyspnea  0 0    VO2 Peak 8.74 9.09    Symptoms No No    Resting HR 60 bpm 65 bpm    Resting BP 102/50 130/72    Resting Oxygen Saturation  97 % 96 %    Exercise Oxygen Saturation  during 6 min walk 99 % 95 %    Max Ex. HR 91 bpm 105 bpm    Max Ex. BP 144/66 140/66    2 Minute Post BP 122/62 116/64              Oxygen Initial Assessment:   Oxygen Re-Evaluation:   Oxygen Discharge (Final Oxygen Re-Evaluation):   Initial Exercise Prescription:  Initial Exercise Prescription - 12/01/22 1400       Date of Initial Exercise RX and Referring Provider   Date 12/01/22    Referring Provider Dr. Lorine Bears, MD      Oxygen   Maintain Oxygen Saturation 88% or higher      Treadmill   MPH 2    Grade 0    Minutes 15    METs 2.53      NuStep   Level 2    SPM 80    Minutes 15    METs 2.5      REL-XR   Level 2    Speed 50    Minutes 15    METs 2.5      T5 Nustep   Level 1    SPM 80    Minutes 15    METs 2.5      Prescription Details   Frequency (times per week) 3    Duration Progress to 30 minutes of continuous aerobic without signs/symptoms of physical distress      Intensity   THRR 40-80% of Max Heartrate 93-126    Ratings of Perceived Exertion 11-13    Perceived Dyspnea 0-4      Progression   Progression Continue to progress workloads to maintain intensity without signs/symptoms of physical distress.      Resistance Training   Training Prescription Yes    Weight 4 lb    Reps 10-15             Perform Capillary Blood Glucose checks as needed.  Exercise Prescription Changes:   Exercise Prescription Changes     Row Name 12/01/22 1400 12/07/22 1600 12/23/22 1400 01/20/23 1400 02/03/23 1500     Response to Exercise   Blood Pressure (Admit)  102/50 108/52 118/62 110/58 122/54   Blood Pressure (Exercise) 144/66 122/58 128/60 138/70 130/64   Blood Pressure (Exit) 122/62 110/58 138/64 110/56 96/58   Heart Rate (Admit) 60 bpm 64 bpm 61 bpm 68 bpm 81 bpm   Heart Rate (Exercise) 91 bpm 94 bpm 97 bpm 122 bpm 108 bpm   Heart Rate (Exit) 59 bpm 76 bpm 69 bpm 84 bpm 76 bpm   Oxygen Saturation (Admit) 97 % -- -- -- --   Oxygen Saturation (Exercise) 99 % -- -- -- --   Rating of Perceived Exertion (Exercise) 7 12 13 13 13    Perceived Dyspnea (Exercise) 0 -- --  -- --   Symptoms None none none -- none   Comments Results first full day of exercise -- -- --   Duration -- Progress to 30 minutes of  aerobic without signs/symptoms of physical distress Continue with 30 min of aerobic exercise without signs/symptoms of physical distress. Continue with 30 min of aerobic exercise without signs/symptoms of physical distress. Continue with 30 min of aerobic exercise without signs/symptoms of physical distress.   Intensity -- THRR unchanged THRR unchanged THRR unchanged THRR unchanged     Progression   Progression -- Continue to progress workloads to maintain intensity without signs/symptoms of physical distress. Continue to progress workloads to maintain intensity without signs/symptoms of physical distress. Continue to progress workloads to maintain intensity without signs/symptoms of physical distress. Continue to progress workloads to maintain intensity without signs/symptoms of physical distress.   Average METs -- 2.46 2.63 2.71 2.64     Resistance Training   Training Prescription -- Yes Yes Yes Yes   Weight -- 4 lb 4 lb 5lb 6lb   Reps -- 10-15 10-15 10-15 10-15     Interval Training   Interval Training -- No No No No     Treadmill   MPH -- 2 2 2  2.3   Grade -- 0 0 0 0.5   Minutes -- 15 15 15 15    METs -- 2.53 2.53 2.53 2.92     NuStep   Level -- 2 3 4  --   Minutes -- 15 15 15  --   METs -- 2.4 2.53 2.6 --     REL-XR   Level -- -- 2 4 4    Speed -- -- -- -- 50   Minutes -- -- 15 15 15    METs -- -- 2.5 -- --     T5 Nustep   Level -- -- -- 2 1   SPM -- -- -- -- 80   Minutes -- -- -- 15 15   METs -- -- -- 2.6 2.6     Oxygen   Maintain Oxygen Saturation -- 88% or higher 88% or higher 88% or higher 88% or higher    Row Name 02/16/23 1300 03/04/23 1600 03/16/23 1500 04/01/23 1700       Response to Exercise   Blood Pressure (Admit) 104/60 100/56 116/60 118/62    Blood Pressure (Exit) 126/68 112/60 108/64 112/60    Heart Rate (Admit) 65  bpm 58 bpm 60 bpm 71 bpm    Heart Rate (Exercise) 123 bpm 97 bpm 90 bpm 102 bpm    Heart Rate (Exit) 70 bpm 64 bpm 90 bpm 78 bpm    Oxygen Saturation (Admit) -- -- -- 97 %    Oxygen Saturation (Exercise) -- -- -- 90 %    Oxygen Saturation (Exit) -- -- -- 94 %    Rating of  Perceived Exertion (Exercise) 14 13 12 13     Perceived Dyspnea (Exercise) -- -- -- 0    Symptoms none none none none    Duration Continue with 30 min of aerobic exercise without signs/symptoms of physical distress. Continue with 30 min of aerobic exercise without signs/symptoms of physical distress. Continue with 30 min of aerobic exercise without signs/symptoms of physical distress. Continue with 30 min of aerobic exercise without signs/symptoms of physical distress.    Intensity THRR unchanged THRR unchanged THRR unchanged THRR unchanged      Progression   Progression Continue to progress workloads to maintain intensity without signs/symptoms of physical distress. Continue to progress workloads to maintain intensity without signs/symptoms of physical distress. Continue to progress workloads to maintain intensity without signs/symptoms of physical distress. Continue to progress workloads to maintain intensity without signs/symptoms of physical distress.    Average METs 2.75 3.21 2.52 3.07      Resistance Training   Training Prescription Yes Yes Yes Yes    Weight 6lb 6 lb 6 lb 6 lb    Reps 10-15 10-15 10-15 10-15      Interval Training   Interval Training No No No No      Treadmill   MPH 2.1 2.1 2.4 2.2    Grade 3 5 0 0.5    Minutes 15 15 15 15     METs 3.48 4.06 2.84 2.84      NuStep   Level -- 4 -- --    Minutes -- 15 -- --    METs -- 2.8 -- --      REL-XR   Level 4 5 -- 5    Minutes 15 15 -- 15    METs -- 3.5 -- 4.2      T5 Nustep   Level 4 3 4 4     Minutes 15 15 15 15     METs 3 2.5 2.2 3.4      Oxygen   Maintain Oxygen Saturation 88% or higher 88% or higher 88% or higher 88% or higher              Exercise Comments:   Exercise Comments     Row Name 12/07/22 1145           Exercise Comments First full day of exercise!  Patient was oriented to gym and equipment including functions, settings, policies, and procedures.  Patient's individual exercise prescription and treatment plan were reviewed.  All starting workloads were established based on the results of the 6 minute walk test done at initial orientation visit.  The plan for exercise progression was also introduced and progression will be customized based on patient's performance and goals.                Exercise Goals and Review:   Exercise Goals     Row Name 12/01/22 1309             Exercise Goals   Increase Physical Activity Yes       Intervention Develop an individualized exercise prescription for aerobic and resistive training based on initial evaluation findings, risk stratification, comorbidities and participant's personal goals.;Provide advice, education, support and counseling about physical activity/exercise needs.       Expected Outcomes Long Term: Exercising regularly at least 3-5 days a week.;Long Term: Add in home exercise to make exercise part of routine and to increase amount of physical activity.;Short Term: Attend rehab on a regular basis to increase amount of physical activity.  Increase Strength and Stamina Yes       Intervention Develop an individualized exercise prescription for aerobic and resistive training based on initial evaluation findings, risk stratification, comorbidities and participant's personal goals.;Provide advice, education, support and counseling about physical activity/exercise needs.       Expected Outcomes Short Term: Increase workloads from initial exercise prescription for resistance, speed, and METs.;Short Term: Perform resistance training exercises routinely during rehab and add in resistance training at home;Long Term: Improve cardiorespiratory fitness, muscular  endurance and strength as measured by increased METs and functional capacity ( )       Able to understand and use rate of perceived exertion (RPE) scale Yes       Intervention Provide education and explanation on how to use RPE scale       Expected Outcomes Short Term: Able to use RPE daily in rehab to express subjective intensity level;Long Term:  Able to use RPE to guide intensity level when exercising independently       Able to understand and use Dyspnea scale Yes       Intervention Provide education and explanation on how to use Dyspnea scale       Expected Outcomes Short Term: Able to use Dyspnea scale daily in rehab to express subjective sense of shortness of breath during exertion;Long Term: Able to use Dyspnea scale to guide intensity level when exercising independently       Knowledge and understanding of Target Heart Rate Range (THRR) Yes       Intervention Provide education and explanation of THRR including how the numbers were predicted and where they are located for reference       Expected Outcomes Long Term: Able to use THRR to govern intensity when exercising independently;Short Term: Able to state/look up THRR;Short Term: Able to use daily as guideline for intensity in rehab       Able to check pulse independently Yes       Intervention Provide education and demonstration on how to check pulse in carotid and radial arteries.;Review the importance of being able to check your own pulse for safety during independent exercise       Expected Outcomes Short Term: Able to explain why pulse checking is important during independent exercise;Long Term: Able to check pulse independently and accurately       Understanding of Exercise Prescription Yes       Intervention Provide education, explanation, and written materials on patient's individual exercise prescription       Expected Outcomes Short Term: Able to explain program exercise prescription;Long Term: Able to explain home exercise  prescription to exercise independently                Exercise Goals Re-Evaluation :  Exercise Goals Re-Evaluation     Row Name 12/07/22 1146 12/23/22 1442 01/20/23 1433 02/03/23 1510 02/16/23 1354     Exercise Goal Re-Evaluation   Exercise Goals Review Increase Physical Activity;Increase Strength and Stamina;Able to understand and use rate of perceived exertion (RPE) scale;Knowledge and understanding of Target Heart Rate Range (THRR);Able to check pulse independently;Understanding of Exercise Prescription Increase Physical Activity;Increase Strength and Stamina;Understanding of Exercise Prescription Increase Physical Activity;Increase Strength and Stamina;Understanding of Exercise Prescription Increase Physical Activity;Increase Strength and Stamina;Understanding of Exercise Prescription Increase Physical Activity;Increase Strength and Stamina;Understanding of Exercise Prescription   Comments Reviewed RPE  and dyspnea scale, THR and program prescription with pt today.  Pt voiced understanding and was given a copy of goals to take home. Dannielle Huh  is new to the program. He has increased his NUstep T4 from level 2 to level 3. He maintained the TM at 2 MPH 0%elevation and maintianed the REXR at level 2. He continues to use 4 lb hand weights.  Will continue to monitor his progress. Adriaan has increased several of his workloads with an RPE of 13.  TM 2/0to 2.2/0, T5Nustepup level 2 from level 1, T4Nustep from level 3 to level 4 and REXR from level 2 to level 4.  Will continue to monitor exercise progression with goal of increased stamina and strength. Jaquail continues to do well in rehab. He increased his hand weights from 5 to 6lbs. He also tried an incline of 0.5% on the treadmill, but eliminated incline in later sessions. We will continue to monitor his progress in the program. Gregori continues to do well in rehab. He recently increased his Treadmill grade from 0.5% to 3%. He also increased his level on the T5  nustep from 1 to 4. We will continue to monitor his progress in the program.   Expected Outcomes Short: Use RPE daily to regulate intensity.  Long: Follow program prescription in THR. STG COntinue to see exercise progression. LTG He has increased stamina and strength from his exercise progression LKG:[id number] workloads as tolerated with goal of increased stamina and strength.   LTG: Continued exercise progression as tolerated during program and after discharge. Short: Try to add 0.5% incline or greater to his treadmill prescription again. Long: Continue exercise to improve strength and stamina. Short: Continue to follow current exercise prescription, and progressively increase workloads. Long: Continue exercise to improve strength and stamina.    Row Name 02/17/23 1718 03/04/23 1621 03/16/23 1511 03/29/23 1118 04/01/23 1727     Exercise Goal Re-Evaluation   Exercise Goals Review Increase Physical Activity;Increase Strength and Stamina;Understanding of Exercise Prescription Increase Physical Activity;Increase Strength and Stamina;Understanding of Exercise Prescription Increase Physical Activity;Increase Strength and Stamina;Understanding of Exercise Prescription Increase Strength and Stamina;Understanding of Exercise Prescription Increase Strength and Stamina;Understanding of Exercise Prescription;Increase Physical Activity   Comments Reviewed home exercise with pt today.  Pt plans to use his at home recumbent bike 2 days per week for exercise. He also plans to start utilizing his 5 lb hand weights with the help of the at home exercise packet provided to him. Reviewed THR, pulse, RPE, sign and symptoms, pulse oximetery and when to call 911 or MD.  Also discussed weather considerations and indoor options.  Pt voiced understanding. Odie continues to do well in rehab. He recently added more incline to his treadmill workload up to 5% while maintaining a speed of 2.1 mph. He also improved to level 5 on the XR and  stayed consistent at level 4 on the T4 nustep. He is also coming due for his post and will look to improve on it. We will continue to monitor his progress in the program. Justinn has only attended on session since the last review. He did keep his treadmill speed consistent at 2.4 mph and increased back up to level 4 on the T5 nustep. He also has continued to do well with 6 lb handweights for resistance training. He is due for his post and will look to improve on it. We will continue to monitor his progress in the program. Patient reports that he uses his RB at home on off days of rehab. He reports that he is exercising about 5 days a week including at home and rehab exercise. He reports  improvement in strengh. Progression guidelines were discussed with patients. He has been able to mow the grass some. He still wants to gaiin more strength to improve independence with ADL's. Interval training was discussed. He will graduate from cardiac rehab soon and plans to exercise independently at a community fitness center upon graduation from the program. Hjalmar has continued to do well in rehab. He recently completed his post-6MWT and increased by 2%. He has been able to maintain his intesntiy on the treadmill as well as on the XR. We will continue to monitor his progress in the program.   Expected Outcomes Short: Supplement rehab with at home exercise 2 times per week. Long: Continue exercise to improve strength and stamina. Short: Improve on post . Long: Continue exercise to improve strength and stamina. Short: Improve on post . Long: Continue exercise to improve strength and stamina. Short: add interval training during cardiac rehab and graduate from Cardiac rehab.. Long: become independent with exercise routine. Short: Graduate. Long: Continue to exercise independently.            Discharge Exercise Prescription (Final Exercise Prescription Changes):  Exercise Prescription Changes - 04/01/23 1700        Response to Exercise   Blood Pressure (Admit) 118/62    Blood Pressure (Exit) 112/60    Heart Rate (Admit) 71 bpm    Heart Rate (Exercise) 102 bpm    Heart Rate (Exit) 78 bpm    Oxygen Saturation (Admit) 97 %    Oxygen Saturation (Exercise) 90 %    Oxygen Saturation (Exit) 94 %    Rating of Perceived Exertion (Exercise) 13    Perceived Dyspnea (Exercise) 0    Symptoms none    Duration Continue with 30 min of aerobic exercise without signs/symptoms of physical distress.    Intensity THRR unchanged      Progression   Progression Continue to progress workloads to maintain intensity without signs/symptoms of physical distress.    Average METs 3.07      Resistance Training   Training Prescription Yes    Weight 6 lb    Reps 10-15      Interval Training   Interval Training No      Treadmill   MPH 2.2    Grade 0.5    Minutes 15    METs 2.84      REL-XR   Level 5    Minutes 15    METs 4.2      T5 Nustep   Level 4    Minutes 15    METs 3.4      Oxygen   Maintain Oxygen Saturation 88% or higher             Nutrition:  Target Goals: Understanding of nutrition guidelines, daily intake of sodium 1500mg , cholesterol 200mg , calories 30% from fat and 7% or less from saturated fats, daily to have 5 or more servings of fruits and vegetables.  Education: All About Nutrition: -Group instruction provided by verbal, written material, interactive activities, discussions, models, and posters to present general guidelines for heart healthy nutrition including fat, fiber, MyPlate, the role of sodium in heart healthy nutrition, utilization of the nutrition label, and utilization of this knowledge for meal planning. Follow up email sent as well. Written material given at graduation. Flowsheet Row Cardiac Rehab from 12/01/2022 in [facility] Cardiac and Pulmonary Rehab  Education need identified 12/01/22       Biometrics:  Pre Biometrics - 12/01/22 1455  Pre Biometrics    Height 5' 7.5" (1.715 m)    Weight 180 lb 3.2 oz (81.7 kg)    Waist Circumference 37.5 inches    Hip Circumference 39.5 inches    Waist to Hip Ratio 0.95 %    BMI (Calculated) 27.79    Single Leg Stand 12.7 seconds             Post Biometrics - 03/22/23 1130        Post  Biometrics   Height 5' 7.5" (1.715 m)    Weight 188 lb (85.3 kg)    Waist Circumference 41.5 inches    Hip Circumference 40 inches    Waist to Hip Ratio 1.04 %    BMI (Calculated) 28.99    Single Leg Stand 30 seconds             Nutrition Therapy Plan and Nutrition Goals:  Nutrition Therapy & Goals - 04/05/23 1352       Nutrition Therapy   Diet Cardiac, Low Na    Protein (specify units) 90    Fiber 30 grams    Whole Grain Foods 3 servings    Saturated Fats 15 max. grams    Fruits and Vegetables 5 servings/day    Sodium 2 grams      Personal Nutrition Goals   Nutrition Goal Drink 64oz of water    Personal Goal #2 When snacking, ask why, drink a glass of water    Personal Goal #3 Eat a protein at every meal and pair with a complex carb    Comments Patient drinking ~40oz of water. Has coffee most days. Takes calcium supplement with a history of anemia. Spoke to him about iron rich foods how to better absorb it and to take his calcium at different times than when intaking iron. Provided handout explaining interaction as well as list of iron rich foods outside of red meat. Educated on types of fats, sources, and how to read label. Also spoke about sodium and rinsing canned foods. He tries to do heart healthy food choices, but his wife also cooks, she has trouble remembering these changes and why they are trying to do them. He has taken the opportunity to do most of the cooking. Commended him on these changes and encouraged him to continue to strive for better cooking choices when able. He reports he has been snacking after dinner, often ice cream. Spoke about why he snacks, determined it was likely due to  habit and boredom. Brainstormed several better snacks to try. Built out several meals and snacks with foods he likes and will eat.      Intervention Plan   Intervention Prescribe, educate and counsel regarding individualized specific dietary modifications aiming towards targeted core components such as weight, hypertension, lipid management, diabetes, heart failure and other comorbidities.;Nutrition handout(s) given to patient.    Expected Outcomes Short Term Goal: Understand basic principles of dietary content, such as calories, fat, sodium, cholesterol and nutrients.;Short Term Goal: A plan has been developed with personal nutrition goals set during dietitian appointment.;Long Term Goal: Adherence to prescribed nutrition plan.             Nutrition Assessments:  MEDIFICTS Score Key: >=70 Need to make dietary changes  40-70 Heart Healthy Diet <= 40 Therapeutic Level Cholesterol Diet  Flowsheet Row Cardiac Rehab from 03/24/2023 in [facility] Cardiac and Pulmonary Rehab  Picture Your Plate Total Score on Discharge 71      Picture Your Plate Scores: <32  Unhealthy dietary pattern with much room for improvement. 41-50 Dietary pattern unlikely to meet recommendations for good health and room for improvement. 51-60 More healthful dietary pattern, with some room for improvement.  >60 Healthy dietary pattern, although there may be some specific behaviors that could be improved.    Nutrition Goals Re-Evaluation:  Nutrition Goals Re-Evaluation     Row Name 01/27/23 1115 02/24/23 1152 03/29/23 1117         Goals   Current Weight 188 lb (85.3 kg) -- --     Nutrition Goal More protien options More protien options --     Comment Meet with RD. Patient had to cancel previous RD apt. He wants to reschedule but will let us know in the future when he can do it. He is currently very busy with caretaking for his wife and doctors appointments, so would like to wait a little while before sheduling with  the RD. Patient rescheduled RD apt and plans to meet with RD next week for an individual consultation.     Expected Outcome Short: Make RD appointment. Long: adhere to a diet that pertains to him. Short: make an RD apt. Long: maintain a heart healthy diet. Short: make an RD apt. Long: maintain a heart healthy diet.              Nutrition Goals Discharge (Final Nutrition Goals Re-Evaluation):  Nutrition Goals Re-Evaluation - 03/29/23 1117       Goals   Comment Patient rescheduled RD apt and plans to meet with RD next week for an individual consultation.    Expected Outcome Short: make an RD apt. Long: maintain a heart healthy diet.             Psychosocial: Target Goals: Acknowledge presence or absence of significant depression and/or stress, maximize coping skills, provide positive support system. Participant is able to verbalize types and ability to use techniques and skills needed for reducing stress and depression.   Education: Stress, Anxiety, and Depression - Group verbal and visual presentation to define topics covered.  Reviews how body is impacted by stress, anxiety, and depression.  Also discusses healthy ways to reduce stress and to treat/manage anxiety and depression.  Written material given at graduation.   Education: Sleep Hygiene -Provides group verbal and written instruction about how sleep can affect your health.  Define sleep hygiene, discuss sleep cycles and impact of sleep habits. Review good sleep hygiene tips.    Initial Review & Psychosocial Screening:  Initial Psych Review & Screening - 11/25/22 1010       Initial Review   Current issues with None Identified      Family Dynamics   Good Support System? Yes   adult son/daughter and wife     Barriers   Psychosocial barriers to participate in program There are no identifiable barriers or psychosocial needs.      Screening Interventions   Interventions Encouraged to exercise;To provide support and  resources with identified psychosocial needs;Provide feedback about the scores to participant    Expected Outcomes Short Term goal: Utilizing psychosocial counselor, staff and physician to assist with identification of specific Stressors or current issues interfering with healing process. Setting desired goal for each stressor or current issue identified.;Long Term Goal: Stressors or current issues are controlled or eliminated.;Short Term goal: Identification and review with participant of any Quality of Life or Depression concerns found by scoring the questionnaire.;Long Term goal: The participant improves quality of Life and PHQ9 Scores as  seen by post scores and/or verbalization of changes             Quality of Life Scores:   Quality of Life - 03/24/23 1125       Quality of Life   Select Quality of Life      Quality of Life Scores   Health/Function Post 23.6 %    Socioeconomic Post 29 %    Psych/Spiritual Post 24 %    Family Post 27.6 %    GLOBAL Post 25.27 %            Scores of 19 and below usually indicate a poorer quality of life in these areas.  A difference of  2-3 points is a clinically meaningful difference.  A difference of 2-3 points in the total score of the Quality of Life Index has been associated with significant improvement in overall quality of life, self-image, physical symptoms, and general health in studies assessing change in quality of life.  PHQ-9: Review Flowsheet  More data exists      03/24/2023 12/01/2022 10/06/2022 09/28/2022 09/30/2021  Depression screen PHQ 2/9  Decreased Interest 0 0 0 0 0  Down, Depressed, Hopeless 0 0 0 0 0  PHQ - 2 Score 0 0 0 0 0  Altered sleeping 1 0 - - -  Tired, decreased energy 0 1 - - -  Change in appetite 1 0 - - -  Feeling bad or failure about yourself  0 0 - - -  Trouble concentrating 0 1 - - -  Moving slowly or fidgety/restless 0 0 - - -  Suicidal thoughts 0 0 - - -  PHQ-9 Score 2 2 - - -  Difficult doing  work/chores Not difficult at all Not difficult at all - - -    Details           Interpretation of Total Score  Total Score Depression Severity:  1-4 = Minimal depression, 5-9 = Mild depression, 10-14 = Moderate depression, 15-19 = Moderately severe depression, 20-27 = Severe depression   Psychosocial Evaluation and Intervention:  Psychosocial Evaluation - 11/25/22 1031       Psychosocial Evaluation & Interventions   Interventions Encouraged to exercise with the program and follow exercise prescription    Comments Rayhan has no barriers to attending the program. He lives with his wife and adult son. THey are his support along with his daughter.  He is undergoing chemo and is receiving a monthly dose. He continues to work Radio producer jobs. He wants to get back in shape he was in prior to his heart attack.  He is ready to get started    Expected Outcomes STG Attends all scheduled sessions, Progresses with his exercise,following the guidelines.  LTG He continues with his exercise progression and following health guidelines    Continue Psychosocial Services  Follow up required by staff             Psychosocial Re-Evaluation:  Psychosocial Re-Evaluation     Row Name 01/27/23 1117 02/24/23 1154 03/29/23 1135         Psychosocial Re-Evaluation   Current issues with Current Stress Concerns;Current Anxiety/Panic Current Stress Concerns;Current Anxiety/Panic Current Stress Concerns;Current Anxiety/Panic     Comments Maan has done his first round of chemo in February. His wife is going through anxiety with his immunotherapy and is creating anxiety for him. His health issues are the brunt of is stressors and anxiety. Davon states his main stress is related  to his health and caregiving of his wife. He does have his daughter and son as support as the family works together to manage these medical situations. He has been working on finding counceling and mental health support for his wife, which  he is hopeful will help with stress and anxiety levels for her and the family. Patient report s no changes in mental health. He did state that he does not sleep well due to his enlarged prostate and prednisone. He is able to get a nap in the afternoon and feels rested thoughout the day.     Expected Outcomes Short: Continue to exercise regularly to support mental health and notify staff of any changes. Long: maintain mental health and well being through teaching of rehab or prescribed medications independently. Short: continue to work towards getting the mental health and counceling support he needs for his wife to help manage her caregiving needs. Long: establish balance with self care and caregiving. Continue to attend cardiac rehab as a way to care for self and have a break from caregiving. Short: graduate from cardiac rehab Long: maintian good mental health routine.     Interventions Encouraged to attend Cardiac Rehabilitation for the exercise Encouraged to attend Cardiac Rehabilitation for the exercise Encouraged to attend Cardiac Rehabilitation for the exercise     Continue Psychosocial Services  Follow up required by staff Follow up required by staff No Follow up required  graduating              Psychosocial Discharge (Final Psychosocial Re-Evaluation):  Psychosocial Re-Evaluation - 03/29/23 1135       Psychosocial Re-Evaluation   Current issues with Current Stress Concerns;Current Anxiety/Panic    Comments Patient report s no changes in mental health. He did state that he does not sleep well due to his enlarged prostate and prednisone. He is able to get a nap in the afternoon and feels rested thoughout the day.    Expected Outcomes Short: graduate from cardiac rehab Long: maintian good mental health routine.    Interventions Encouraged to attend Cardiac Rehabilitation for the exercise    Continue Psychosocial Services  No Follow up required   graduating            Vocational  Rehabilitation: Provide vocational rehab assistance to qualifying candidates.   Vocational Rehab Evaluation & Intervention:  Vocational Rehab - 11/25/22 1013       Initial Vocational Rehab Evaluation & Intervention   Assessment shows need for Vocational Rehabilitation No      Vocational Rehab Re-Evaulation   Comments working part time  was self employed             Education: Education Goals: Education classes will be provided on a variety of topics geared toward better understanding of heart health and risk factor modification. Participant will state understanding/return demonstration of topics presented as noted by education test scores.  Learning Barriers/Preferences:   General Cardiac Education Topics:  AED/CPR: - Group verbal and written instruction with the use of models to demonstrate the basic use of the AED with the basic ABC's of resuscitation.   Anatomy and Cardiac Procedures: - Group verbal and visual presentation and models provide information about basic cardiac anatomy and function. Reviews the testing methods done to diagnose heart disease and the outcomes of the test results. Describes the treatment choices: Medical Management, Angioplasty, or Coronary Bypass Surgery for treating various heart conditions including Myocardial Infarction, Angina, Valve Disease, and Cardiac Arrhythmias.  Written material given  at graduation.   Medication Safety: - Group verbal and visual instruction to review commonly prescribed medications for heart and lung disease. Reviews the medication, class of the drug, and side effects. Includes the steps to properly store meds and maintain the prescription regimen.  Written material given at graduation.   Intimacy: - Group verbal instruction through game format to discuss how heart and lung disease can affect sexual intimacy. Written material given at graduation..   Know Your Numbers and Heart Failure: - Group verbal and visual  instruction to discuss disease risk factors for cardiac and pulmonary disease and treatment options.  Reviews associated critical values for Overweight/Obesity, Hypertension, Cholesterol, and Diabetes.  Discusses basics of heart failure: signs/symptoms and treatments.  Introduces Heart Failure Zone chart for action plan for heart failure.  Written material given at graduation.   Infection Prevention: - Provides verbal and written material to individual with discussion of infection control including proper hand washing and proper equipment cleaning during exercise session. Flowsheet Row Cardiac Rehab from 12/01/2022 in [facility] Cardiac and Pulmonary Rehab  Date 12/01/22  Educator NT  Instruction Review Code 1- Verbalizes Understanding       Falls Prevention: - Provides verbal and written material to individual with discussion of falls prevention and safety. Flowsheet Row Cardiac Rehab from 12/01/2022 in [facility] Cardiac and Pulmonary Rehab  Date 11/25/22  Educator SB  Instruction Review Code 1- Verbalizes Understanding       Other: -Provides group and verbal instruction on various topics (see comments)   Knowledge Questionnaire Score:  Knowledge Questionnaire Score - 03/24/23 1123       Knowledge Questionnaire Score   Pre Score 25/26             Core Components/Risk Factors/Patient Goals at Admission:  Personal Goals and Risk Factors at Admission - 11/25/22 1012       Core Components/Risk Factors/Patient Goals on Admission    Weight Management Yes    Intervention Weight Management: Provide education and appropriate resources to help participant work on and attain dietary goals.;Weight Management: Develop a combined nutrition and exercise program designed to reach desired caloric intake, while maintaining appropriate intake of nutrient and fiber, sodium and fats, and appropriate energy expenditure required for the weight goal.    Admit Weight 175 lb (79.4 kg)    Goal Weight: Long  Term 175 lb (79.4 kg)    Expected Outcomes Short Term: Continue to assess and modify interventions until short term weight is achieved;Long Term: Adherence to nutrition and physical activity/exercise program aimed toward attainment of established weight goal;Weight Maintenance: Understanding of the daily nutrition guidelines, which includes 25-35% calories from fat, 7% or less cal from saturated fats, less than 200mg  cholesterol, less than 1.5gm of sodium, & 5 or more servings of fruits and vegetables daily    Lipids Yes    Intervention Provide education and support for participant on nutrition & aerobic/resistive exercise along with prescribed medications to achieve LDL 70mg , HDL >40mg .    Expected Outcomes Short Term: Participant states understanding of desired cholesterol values and is compliant with medications prescribed. Participant is following exercise prescription and nutrition guidelines.;Long Term: Cholesterol controlled with medications as prescribed, with individualized exercise RX and with personalized nutrition plan. Value goals: LDL < 70mg , HDL > 40 mg.             Education:Diabetes - Individual verbal and written instruction to review signs/symptoms of diabetes, desired ranges of glucose level fasting, after meals and with exercise. Acknowledge  that pre and post exercise glucose checks will be done for 3 sessions at entry of program.   Core Components/Risk Factors/Patient Goals Review:   Goals and Risk Factor Review     Row Name 01/27/23 1119 02/24/23 1149 03/29/23 1138         Core Components/Risk Factors/Patient Goals Review   Personal Goals Review Weight Management/Obesity;Hypertension Lipids Weight Management/Obesity     Review Dederick would like to lose some weight. His weight has gone up to 188 pounds since the start of the program. He would like to lost some weight. He wants to reach a weight goal of 180lb. His blood pressures have been good and is checking at home.  His blood pressure was 134/58 today. Informed him to check BP at home to correlate with out readings. Patient verbalises understanding. Levine reports that he is taking all his medication at prescibed. He is not having any trouble with with his meds and continues to work with his healthcare team to manage his medications. Vue reports that he has gained some weight but that is due to being on long term prednisone. He take alll meds as prescribed and plans to continue to exercise at a community fitness center and at home upoon graduation to help continue to control cardiac risk factos.     Expected Outcomes Short: Lose a few pounds in the next few weeks and check blood pressure at home. Long: maintain weight and blood pressure readings independently Short: continue to take all medications and work with healthcare team if any concerns arise. Long: continue to [facility] cardiac risk factors. Short: graduate from cardiac rehab. Long: continue to control cardiac risk factors with heart healthy lifestyle.              Core Components/Risk Factors/Patient Goals at Discharge (Final Review):   Goals and Risk Factor Review - 03/29/23 1138       Core Components/Risk Factors/Patient Goals Review   Personal Goals Review Weight Management/Obesity    Review Aneesh reports that he has gained some weight but that is due to being on long term prednisone. He take alll meds as prescribed and plans to continue to exercise at a community fitness center and at home upoon graduation to help continue to control cardiac risk factos.    Expected Outcomes Short: graduate from cardiac rehab. Long: continue to control cardiac risk factors with heart healthy lifestyle.             ITP Comments:  ITP Comments     Row Name 11/25/22 1028 12/01/22 1309 12/07/22 1144 12/09/22 0731 01/05/23 1359   ITP Comments Virtual orientation call completed today. he has an appointment on Date: 12/01/2022  for EP eval and gym Orientation.   Documentation of diagnosis can be found in [facility] Date: 09/25/2022 . Completed and gym orientation. Initial ITP created and sent for review to Dr. Bethann Punches, Medical Director. First full day of exercise!  Patient was oriented to gym and equipment including functions, settings, policies, and procedures.  Patient's individual exercise prescription and treatment plan were reviewed.  All starting workloads were established based on the results of the 6 minute walk test done at initial orientation visit.  The plan for exercise progression was also introduced and progression will be customized based on patient's performance and goals. 30 Day review completed. Medical Director ITP review done, changes made as directed, and signed approval by Medical Director.   new to program 30 Day review completed. Medical Director ITP  review done, changes made as directed, and signed approval by Medical Director.    Row Name 02/03/23 1121 03/03/23 0931 03/31/23 0958 04/07/23 1122     ITP Comments 30 Day review completed. Medical Director ITP review done, changes made as directed, and signed approval by Medical Director. 30 Day review completed. Medical Director ITP review done, changes made as directed, and signed approval by Medical Director. 30 Day review completed. Medical Director ITP review done, changes made as directed, and signed approval by Medical Director. Arvin graduated today from  rehab with 36 sessions completed.  Details of the patient's exercise prescription and what He needs to do in order to continue the prescription and progress were discussed with patient.  Patient was given a copy of prescription and goals.  Patient verbalized understanding. Kentravious plans to continue to exercise by recumbent bike 2 days/week, yardwork, 5lb weights, and [employer] 1 day/week.             Comments: Discharge ITP

## 2023-04-09 ENCOUNTER — Encounter: Payer: Self-pay | Admitting: Internal Medicine

## 2023-04-09 ENCOUNTER — Other Ambulatory Visit: Payer: Self-pay | Admitting: Physician Assistant

## 2023-04-09 NOTE — Progress Notes (Signed)
Pt called 10/10 at 3:25pm to share his concerns about some restlessness and lightheadedness and is wondering if these could be side effects from the steroid he started taking on 10/1. He claims that his daily fluid intake has been good "50oz or so", and his blood pressure has been normal. He was also concerned about that rash on his face that seems to be getting worse again. He states he finished the antibiotic and antiviral medication his PCM had prescribed him and it doesn't seem to have worked. He denies that it's painful or itchy, but does put on cortisone cream. I reached out to C.Heilingoetter, Onc PA via [place] and relayed the pt's concerns. Guidance from C.Heilingoetter, Onc PA was that the steroid could be causing those symptoms. I told the pt to maintain his hydration status and take his time standing up and if it gets worse to let us know right away. Per C.Heilingoetter, Onc PA's recommendation, the pt should call his dermatologist right away about the rash on his face, as it would have improved with the steroids he's on. I called the pt back shortly after 4pm and shared the guidance I received from C.Heilingoetter, Onc PA Pt told me he was going to call his derm after our call ended.

## 2023-04-12 ENCOUNTER — Inpatient Hospital Stay: Payer: Medicare Other

## 2023-04-12 DIAGNOSIS — Z79899 Other long term (current) drug therapy: Secondary | ICD-10-CM | POA: Diagnosis not present

## 2023-04-12 DIAGNOSIS — C7A1 Malignant poorly differentiated neuroendocrine tumors: Secondary | ICD-10-CM | POA: Diagnosis not present

## 2023-04-12 DIAGNOSIS — B358 Other dermatophytoses: Secondary | ICD-10-CM | POA: Diagnosis not present

## 2023-04-12 DIAGNOSIS — C61 Malignant neoplasm of prostate: Secondary | ICD-10-CM | POA: Diagnosis not present

## 2023-04-12 DIAGNOSIS — C7B8 Other secondary neuroendocrine tumors: Secondary | ICD-10-CM | POA: Diagnosis not present

## 2023-04-12 LAB — CBC WITH DIFFERENTIAL (CANCER CENTER ONLY)
Abs Immature Granulocytes: 0.11 10*3/uL — ABNORMAL HIGH (ref 0.00–0.07)
Basophils Absolute: 0 10*3/uL (ref 0.0–0.1)
Basophils Relative: 0 %
Eosinophils Absolute: 0 10*3/uL (ref 0.0–0.5)
Eosinophils Relative: 0 %
HCT: 35.6 % — ABNORMAL LOW (ref 39.0–52.0)
Hemoglobin: 12.4 g/dL — ABNORMAL LOW (ref 13.0–17.0)
Immature Granulocytes: 1 %
Lymphocytes Relative: 5 %
Lymphs Abs: 0.6 10*3/uL — ABNORMAL LOW (ref 0.7–4.0)
MCH: 34.3 pg — ABNORMAL HIGH (ref 26.0–34.0)
MCHC: 34.8 g/dL (ref 30.0–36.0)
MCV: 98.6 fL (ref 80.0–100.0)
Monocytes Absolute: 0.5 10*3/uL (ref 0.1–1.0)
Monocytes Relative: 4 %
Neutro Abs: 10.2 10*3/uL — ABNORMAL HIGH (ref 1.7–7.7)
Neutrophils Relative %: 90 %
Platelet Count: 104 10*3/uL — ABNORMAL LOW (ref 150–400)
RBC: 3.61 MIL/uL — ABNORMAL LOW (ref 4.22–5.81)
RDW: 15.8 % — ABNORMAL HIGH (ref 11.5–15.5)
WBC Count: 11.4 10*3/uL — ABNORMAL HIGH (ref 4.0–10.5)
nRBC: 0.2 % (ref 0.0–0.2)

## 2023-04-12 LAB — CMP (CANCER CENTER ONLY)
ALT: 143 U/L — ABNORMAL HIGH (ref 0–44)
AST: 55 U/L — ABNORMAL HIGH (ref 15–41)
Albumin: 3.5 g/dL (ref 3.5–5.0)
Alkaline Phosphatase: 132 U/L — ABNORMAL HIGH (ref 38–126)
Anion gap: 6 (ref 5–15)
BUN: 28 mg/dL — ABNORMAL HIGH (ref 8–23)
CO2: 24 mmol/L (ref 22–32)
Calcium: 8.7 mg/dL — ABNORMAL LOW (ref 8.9–10.3)
Chloride: 103 mmol/L (ref 98–111)
Creatinine: 1.13 mg/dL (ref 0.61–1.24)
GFR, Estimated: >60 mL/min (ref >60)
Glucose, Bld: 265 mg/dL — ABNORMAL HIGH (ref 70–99)
Potassium: 4.1 mmol/L (ref 3.5–5.1)
Sodium: 133 mmol/L — ABNORMAL LOW (ref 135–145)
Total Bilirubin: 1.8 mg/dL — ABNORMAL HIGH (ref 0.3–1.2)
Total Protein: 5.8 g/dL — ABNORMAL LOW (ref 6.5–8.1)

## 2023-04-12 LAB — SAMPLE TO BLOOD BANK

## 2023-04-13 ENCOUNTER — Telehealth: Payer: Self-pay

## 2023-04-13 NOTE — Telephone Encounter (Signed)
-----   Message from Cassandra L Heilingoetter sent at 04/13/2023  8:04 AM EDT ----- Can you call him. I reviewed his labs with Dr. Arbutus Ped. His LFTs are still elevated but trending down. Continue the steroid, get CT as scheduled, and see [place] back in November as planned. ----- Message ----- From: Interface, Lab In [place] Sent: 04/12/2023  12:18 PM EDT To: Johnette Abraham Heilingoetter, PA-C

## 2023-04-13 NOTE — Telephone Encounter (Signed)
Called pt per message below.  Pt verbalized understanding and was told to call with any concerns or questions.

## 2023-04-14 ENCOUNTER — Other Ambulatory Visit: Payer: Medicare Other

## 2023-04-14 NOTE — ED Notes (Signed)
Chart opened for record review 10/16 1808

## 2023-04-20 ENCOUNTER — Ambulatory Visit: Payer: Medicare Other

## 2023-04-20 ENCOUNTER — Other Ambulatory Visit: Payer: Medicare Other

## 2023-04-20 ENCOUNTER — Ambulatory Visit: Payer: Medicare Other | Admitting: Physician Assistant

## 2023-04-21 ENCOUNTER — Other Ambulatory Visit: Payer: Self-pay | Admitting: Physician Assistant

## 2023-04-21 DIAGNOSIS — T50905A Adverse effect of unspecified drugs, medicaments and biological substances, initial encounter: Secondary | ICD-10-CM

## 2023-04-26 ENCOUNTER — Ambulatory Visit (HOSPITAL_COMMUNITY)
Admission: RE | Admit: 2023-04-26 | Discharge: 2023-04-26 | Disposition: A | Discharge status: Home / Self Care | Payer: Medicare Other | Source: Ambulatory Visit | Attending: Physician Assistant | Admitting: Physician Assistant

## 2023-04-26 DIAGNOSIS — K716 Toxic liver disease with hepatitis, not elsewhere classified: Secondary | ICD-10-CM | POA: Insufficient documentation

## 2023-04-26 DIAGNOSIS — Z08 Encounter for follow-up examination after completed treatment for malignant neoplasm: Secondary | ICD-10-CM | POA: Diagnosis not present

## 2023-04-26 DIAGNOSIS — C4442 Squamous cell carcinoma of skin of scalp and neck: Secondary | ICD-10-CM | POA: Diagnosis not present

## 2023-04-26 DIAGNOSIS — C7951 Secondary malignant neoplasm of bone: Secondary | ICD-10-CM | POA: Diagnosis not present

## 2023-04-26 DIAGNOSIS — L821 Other seborrheic keratosis: Secondary | ICD-10-CM | POA: Diagnosis not present

## 2023-04-26 DIAGNOSIS — T50905A Adverse effect of unspecified drugs, medicaments and biological substances, initial encounter: Secondary | ICD-10-CM | POA: Insufficient documentation

## 2023-04-26 DIAGNOSIS — C7A1 Malignant poorly differentiated neuroendocrine tumors: Secondary | ICD-10-CM | POA: Insufficient documentation

## 2023-04-26 DIAGNOSIS — D485 Neoplasm of uncertain behavior of skin: Secondary | ICD-10-CM | POA: Diagnosis not present

## 2023-04-26 DIAGNOSIS — C44329 Squamous cell carcinoma of skin of other parts of face: Secondary | ICD-10-CM | POA: Diagnosis not present

## 2023-04-26 DIAGNOSIS — K7460 Unspecified cirrhosis of liver: Secondary | ICD-10-CM | POA: Diagnosis not present

## 2023-04-26 DIAGNOSIS — D229 Melanocytic nevi, unspecified: Secondary | ICD-10-CM | POA: Diagnosis not present

## 2023-04-26 DIAGNOSIS — L570 Actinic keratosis: Secondary | ICD-10-CM | POA: Diagnosis not present

## 2023-04-26 DIAGNOSIS — Z85828 Personal history of other malignant neoplasm of skin: Secondary | ICD-10-CM | POA: Diagnosis not present

## 2023-04-26 DIAGNOSIS — R229 Localized swelling, mass and lump, unspecified: Secondary | ICD-10-CM | POA: Diagnosis not present

## 2023-04-26 DIAGNOSIS — L814 Other melanin hyperpigmentation: Secondary | ICD-10-CM | POA: Diagnosis not present

## 2023-04-26 DIAGNOSIS — L578 Other skin changes due to chronic exposure to nonionizing radiation: Secondary | ICD-10-CM | POA: Diagnosis not present

## 2023-04-26 DIAGNOSIS — C3490 Malignant neoplasm of unspecified part of unspecified bronchus or lung: Secondary | ICD-10-CM | POA: Diagnosis not present

## 2023-04-26 DIAGNOSIS — C4441 Basal cell carcinoma of skin of scalp and neck: Secondary | ICD-10-CM | POA: Diagnosis not present

## 2023-04-26 MED ORDER — IOHEXOL 300 MG/ML  SOLN
100.0000 mL | Freq: Once | INTRAMUSCULAR | Status: AC | PRN
  Administered 2023-04-26: 100 mL via INTRAVENOUS

## 2023-04-27 DIAGNOSIS — B358 Other dermatophytoses: Secondary | ICD-10-CM | POA: Diagnosis not present

## 2023-04-27 DIAGNOSIS — D485 Neoplasm of uncertain behavior of skin: Secondary | ICD-10-CM | POA: Diagnosis not present

## 2023-04-27 DIAGNOSIS — L820 Inflamed seborrheic keratosis: Secondary | ICD-10-CM | POA: Diagnosis not present

## 2023-04-28 ENCOUNTER — Ambulatory Visit: Payer: Medicare Other

## 2023-04-28 ENCOUNTER — Ambulatory Visit: Payer: Medicare Other | Admitting: Internal Medicine

## 2023-04-28 ENCOUNTER — Other Ambulatory Visit: Payer: Medicare Other

## 2023-05-01 NOTE — Progress Notes (Unsigned)
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 Mena Goes. Currently on Zitiga and Eligard.   PRIOR THERAPY: None  CURRENT THERAPY: 1) Palliative systemic chemotherapy with carboplatin for AUC of 5 and Imfinzi 1500 Mg IV on day 1 as well as etoposide 100 Mg/M2 on days 1, 2 and 3 with Neulasta support on day 5. Status post 7 cycles. First dose on 08/11/22.  Starting from cycle #2 his carboplatin will be reduced to AUC of 4 and 2 etoposide 80 Mg/M2 secondary to intolerance and cycle #1.  Starting from cycle #5 he will be on maintenance treatment with single agent Imfinzi 1500 Mg IV every 4 weeks.  2) Currently on Zitiga and Eligard through urology for prostate cancer.  INTERVAL HISTORY: Larry Kent 77 y.o. male returns to the clinic today for a follow up visit. The patient was last seen by Dr. Arbutus Ped and myself 4 weeks ago. His labs showed some elevated LFTs felt to be secondary to his immunotherapy. His scans do also mention some cirrhosis. He denies history of heavy alcohol use. Therefore, he was placed on a high dose steroid taper. He states he completed this. He is back to his baseline prednisone 5 mg twice daily that he takes for his Zytiga. He denies tylenol use.   The patient is a little tachycardic today.  He missed his dose of metoprolol last night and this morning due to being in a hurry.  He had been struggling with a skin rash.  He had previously  tried steroids, antibiotics, and antivirals.  He eventually saw dermatology who states this was ringworm.  He has a cream which is improving his rash.  He completed cardiac rehab 3 times a week.  He has been having low energy the third week of taking his prednisone taper.  He states that he feels like his knees and back cannot carry his body secondary to fatigue and weakness.  He is having more trouble exercising and gets tired after 10 minutes.  He is able to do his stationary bike a little bit easier than walking.  When asked about shortness of breath, he states he has not noticed shortness of breath is much as when he eats he feels bloated and he feels like it presses on his lung and may cause some shortness of breath/compression of his diaphragm.  Denies any fever, chills, or weight loss.  Denies any chest pain, cough, or hemoptysis.  Denies any nausea, vomiting, diarrhea, or constipation.  He recently had a restaging CT scan.  He is here today for evaluation to review his scan results and discuss the next steps in his care.       MEDICAL HISTORY: Past Medical History:  Diagnosis Date   Allergy    Arthritis    both knees    BCC (basal cell carcinoma), arm, right 09/2019   MOHS ([place])   BCC (basal cell carcinoma), face 2014   L preauricular s/p MOHS   BCC (basal cell carcinoma), face 02/2018   L upper lip  BPH (benign prostatic hypertrophy)    Dr. Mena Goes @ [facility]   Carotid stenosis 05/2015   RICA 40-59%, LICA 1-39%, L vertebral occlusion, rpt 1 yr   Cataract    removed both eyes    FHx: colon cancer    FHx: prostate cancer    Heart murmur    mild aortic stenosis    History of kidney stones    Hyperlipidemia    borderline- on rosuvastatin now normal    Occlusion of right vertebral artery 05/2015    ALLERGIES:  has No Known Allergies.  MEDICATIONS:  Current Outpatient Medications  Medication Sig Dispense Refill   abiraterone acetate (ZYTIGA) 250 MG tablet Take 1,000 mg  by mouth daily.     acetaminophen (TYLENOL) 500 MG tablet Take 1,000 mg by mouth every 6 (six) hours as needed for mild pain or headache.     Calcium Carb-Cholecalciferol (RA CALCIUM PLUS VITAMIN D) 600-10 MG-MCG TABS Take 600 mg by mouth daily.     fluticasone (FLONASE) 50 MCG/ACT nasal spray Place 1 spray into both nostrils daily as needed for allergies or rhinitis.     lidocaine-prilocaine (EMLA) cream Apply to the Port-A-Cath site 30-60 minutes before chemotherapy treatment (Patient taking differently: Apply 1 Application topically See admin instructions. Apply to the Port-A-Cath site 30-60 minutes before chemotherapy treatment) 30 g 0   metoprolol tartrate (LOPRESSOR) 25 MG tablet Take 0.5 tablets (12.5 mg total) by mouth 2 (two) times daily. 30 tablet 11   Multiple Vitamin (MULTIVITAMIN) tablet Take 1 tablet by mouth daily with breakfast.     omeprazole (PRILOSEC) 20 MG capsule TAKE 1 CAPSULE BY MOUTH EVERY DAY 90 capsule 1   predniSONE (DELTASONE) 20 MG tablet Please take 4 tablets (80 mg) daily for 7 days, then take 3 tablets daily (60 mg) for 7 days, followed by 2 tablets daily (40 mg) daily for 7 days, followed by 1 tablet daily (20 mg) daily for 1 week, followed by 1/2 tablet for 7 days, then stop 74 tablet 0   predniSONE (DELTASONE) 5 MG tablet Take 5 mg by mouth 2 (two) times daily.     prochlorperazine (COMPAZINE) 10 MG tablet Take 1 tablet (10 mg total) by mouth every 6 (six) hours as needed for nausea or vomiting. 30 tablet 0   rosuvastatin (CRESTOR) 20 MG tablet Take 1 tablet (20 mg total) by mouth daily. 90 tablet 4   tamsulosin (FLOMAX) 0.4 MG CAPS capsule Take 0.4 mg by mouth 2 (two) times daily.     ticagrelor (BRILINTA) 90 MG TABS tablet Take 1 tablet (90 mg total) by mouth 2 (two) times daily. 60 tablet 11   Zinc 25 MG TABS Take 25 mg by mouth daily.     cephALEXin (KEFLEX) 500 MG capsule Take 1 capsule (500 mg total) by mouth 3 (three) times daily. 21 capsule 0    nitrofurantoin (MACRODANTIN) 50 MG capsule Take 1 capsule (50 mg total) by mouth 2 (two) times daily. 10 capsule 0   valACYclovir (VALTREX) 1000 MG tablet Take 1 tablet (1,000 mg total) by mouth 3 (three) times daily. 21 tablet 0   No current facility-administered medications for this visit.    SURGICAL HISTORY:  Past Surgical History:  Procedure Laterality Date   BASAL CELL CARCINOMA EXCISION  05/2018   lip, 2020 x3  basal cells removed    CATARACT EXTRACTION  2008   Left   CATARACT EXTRACTION  2013   Right ([place]ppes)   COLONOSCOPY  ~2010  medium int hemorrhoids, o/w WNL, rpt 5 yrs given fmhx (Dr. Randa Evens)   COLONOSCOPY  08/2019   TAs, diverticulosis, rpt 3 yrs ([place]uster)   CORONARY/GRAFT ACUTE MI REVASCULARIZATION N/A 09/07/2022   Procedure: Coronary/Graft Acute MI Revascularization;  Surgeon: Orbie Pyo, MD;  Location: [facility];  Service: Cardiovascular;  Laterality: N/A;   ENDARTERECTOMY Right 12/01/2019   Procedure: RIGHT ENDARTERECTOMY CAROTID;  Surgeon: Cephus Shelling, MD;  Location: [facility];  Service: Vascular;  Laterality: Right;   exercise treadmill  11/2005   WNL Eldridge Dace)   FINE NEEDLE ASPIRATION  07/28/2022   Procedure: FINE NEEDLE ASPIRATION (FNA) LINEAR;  Surgeon: Josephine Igo, DO;  Location: [facility] ENDOSCOPY;  Service: Pulmonary;;   HERNIA REPAIR  2008   Right   IR IMAGING GUIDED PORT INSERTION  08/11/2022   IR IMAGING GUIDED PORT INSERTION  10/08/2022   IR REMOVAL TUN ACCESS W/ PORT W/O FL MOD SED  09/10/2022   LEFT HEART CATH AND CORONARY ANGIOGRAPHY N/A 09/07/2022   Procedure: LEFT HEART CATH AND CORONARY ANGIOGRAPHY;  Surgeon: Orbie Pyo, MD;  Location: [facility];  Service: Cardiovascular;  Laterality: N/A;   MOHS SURGERY Left spring 2014   basal cell face   PATCH ANGIOPLASTY Right 12/01/2019   Procedure: PATCH ANGIOPLASTY USING Livia Snellen BIOLOGIC PATCH;  Surgeon: Cephus Shelling, MD;  Location: [facility] OR;  Service: Vascular;   Laterality: Right;   Testicular Biopsy  2003   benign, varicocele   US ECHOCARDIOGRAPHY  11/2005   aortic sclerosis, EF 55-60%, diastolic dysfunction   VIDEO BRONCHOSCOPY WITH ENDOBRONCHIAL ULTRASOUND Bilateral 07/28/2022   Procedure: VIDEO BRONCHOSCOPY WITH ENDOBRONCHIAL ULTRASOUND;  Surgeon: Josephine Igo, DO;  Location: [facility] ENDOS[facility];  Service: Pulmonary;  Laterality: Bilateral;    REVIEW OF SYSTEMS:   Review of Systems  Constitutional: Positive for fatigue.  Negative for appetite change, chills,  fever and unexpected weight change.  HENT: Negative for mouth sores, nosebleeds, sore throat and trouble swallowing.   Eyes: Negative for eye problems and icterus.  Respiratory: Mild dyspnea with bloating. Negative for cough, hemoptysis,  and wheezing.   Cardiovascular: Negative for chest pain and leg swelling.  Gastrointestinal: Positive for bloating. Negative for abdominal pain, constipation, diarrhea, nausea and vomiting.  Genitourinary: Negative for bladder incontinence, difficulty urinating, dysuria, frequency and hematuria.   Musculoskeletal: Negative for back pain, gait problem, neck pain and neck stiffness.  Skin: Improving facial rash.  Neurological: Negative for dizziness, extremity weakness, gait problem, headaches, light-headedness and seizures.  Hematological: Negative for adenopathy. Does not bruise/bleed easily.  Psychiatric/Behavioral: Negative for confusion, depression and sleep disturbance. The patient is not nervous/anxious.     PHYSICAL EXAMINATION:  Blood pressure 116/81, pulse 96, temperature 98.7 F (37.1 C), temperature source Oral, resp. rate 18, weight 187 lb 9.6 oz (85.1 kg), SpO2 98%.  ECOG PERFORMANCE STATUS: 1  Physical Exam  Constitutional: Oriented to person, place, and time and well-developed, well-nourished, and in no distress.   HENT:  Head: Normocephalic and atraumatic.  Mouth/Throat: Oropharynx is clear and moist. No oropharyngeal exudate.  Eyes:  Conjunctivae are normal. Right eye exhibits no discharge. Left eye exhibits no discharge. No scleral icterus.  Neck: Normal range of motion. Neck supple.  Cardiovascular: Mildly tachycardic rate, regular rhythm, murmur again noted (sees cardiology) and intact distal pulses.   Pulmonary/Chest: Effort normal and breath sounds normal. No respiratory distress. No wheezes. No rales.  Abdominal: Soft. Bowel sounds are normal. Exhibits no distension and no mass.  There is no tenderness.  Musculoskeletal: Normal range of motion. Exhibits no edema.  Lymphadenopathy:    No cervical adenopathy.  Neurological: Alert and oriented to person, place, and time. Exhibits normal muscle tone. Gait normal. Coordination normal.  Skin: Improving rash on left cheek. Skin is warm and dry. Not diaphoretic. No pallor.  Psychiatric: Mood, memory and judgment normal.  Vitals reviewed.  LABORATORY DATA: Lab Results  Component Value Date   WBC 5.9 05/04/2023   HGB 14.5 05/04/2023   HCT 41.5 05/04/2023   MCV 100.0 05/04/2023   PLT 88 (L) 05/04/2023      Chemistry      Component Value Date/Time   NA 136 05/04/2023 0808   K 3.8 05/04/2023 0808   CL 104 05/04/2023 0808   CO2 26 05/04/2023 0808   BUN 19 05/04/2023 0808   CREATININE 0.98 05/04/2023 0808      Component Value Date/Time   CALCIUM 9.0 05/04/2023 0808   ALKPHOS 95 05/04/2023 0808   AST 97 (H) 05/04/2023 0808   ALT 156 (H) 05/04/2023 0808   BILITOT 1.7 (H) 05/04/2023 0808       RADIOGRAPHIC STUDIES:  CT CHEST ABDOMEN PELVIS W CONTRAST  Result Date: 05/04/2023 CLINICAL DATA:  Small-cell lung cancer restaging, metastatic disease evaluation, elevated LFTs * Tracking Code: BO * EXAM: CT CHEST, ABDOMEN, AND PELVIS WITH CONTRAST TECHNIQUE: Multidetector CT imaging of the chest, abdomen and pelvis was performed following the standard protocol during bolus administration of intravenous contrast. RADIATION DOSE REDUCTION: This exam was performed according  to the departmental dose-optimization program which includes automated exposure control, adjustment of the mA and/or kV according to patient size and/or use of iterative reconstruction technique. CONTRAST:  OMNIPAQUE IOHEXOL 300 MG/ML  SOLN COMPARISON:  02/19/2023 FINDINGS: CT CHEST FINDINGS Cardiovascular: Left chest port catheter. Aortic atherosclerosis. Aortic valve calcifications. Normal heart size. Three-vessel coronary artery calcifications and or stents. No pericardial effusion. Mediastinum/Nodes: Unchanged prominent subcarinal lymph node measuring 1.4 x 0.9 cm (series 2, image 25). No other enlarged mediastinal, hilar, or axillary lymph nodes. Thyroid gland, trachea, and esophagus demonstrate no significant findings. Lungs/Pleura: Multiple unchanged, treated, spiculated bilateral pulmonary nodules, largest again a 1.6 x 1.1 cm nodule in the anterior right middle lobe (series 4, image 87), additional index nodule in the posterior left upper lobe measuring 1.0 x 0.8 cm (series 4, image 52). No pleural effusion or pneumothorax. Musculoskeletal: No chest wall abnormality. No acute osseous findings. CT ABDOMEN PELVIS FINDINGS Hepatobiliary: No solid liver abnormality is seen. Coarse, nodular contour of the liver. No gallstones, gallbladder wall thickening, or biliary dilatation. Pancreas: Diffuse fatty atrophy of the pancreas. No pancreatic ductal dilatation or surrounding inflammatory changes. Spleen: Normal in size without significant abnormality. Adrenals/Urinary Tract: Adrenal glands are unremarkable. Kidneys are normal, without renal calculi, solid lesion, or hydronephrosis. Unchanged, left eccentric thickening of the urinary bladder wall measuring up 2 1.0 cm (series 2, image 106). Stomach/Bowel: Stomach is within normal limits. Appendix not clearly visualized. No evidence of bowel wall thickening, distention, or inflammatory changes. Vascular/Lymphatic: Aortic atherosclerosis. No enlarged abdominal  or pelvic lymph nodes. Reproductive: No mass or other abnormality. Other: No abdominal wall hernia or abnormality. No ascites. Musculoskeletal: No acute osseous findings. Unchanged sclerotic osseous metastases, for example of the T12 vertebral body (series 2, image 52), the L2 vertebral body (series 2, image 67), and the bilateral inferior pubic rami (series 2, image 121). IMPRESSION: 1. Multiple unchanged, treated, spiculated bilateral pulmonary nodules. 2. Unchanged prominent subcarinal  lymph node. 3. Unchanged sclerotic osseous metastases. 4. No evidence of new metastatic disease in the chest, abdomen, or pelvis. 5. Cirrhotic morphology of the liver. 6. Unchanged, left eccentric thickening of the urinary bladder wall measuring up to 1.0 cm. This is nonspecific and may reflect chronic sequelae of infection or inflammation, however neoplasm is not excluded. 7. Coronary artery disease. Aortic Atherosclerosis (ICD10-I70.0). Electronically Signed   By: Jearld Lesch M.D.   On: 05/04/2023 08:43     ASSESSMENT/PLAN:  This is a very pleasant 77 year old Caucasian male recently diagnosed with  1) metastatic poorly differentiated carcinoma, neuroendocrine carcinoma of suspicious prostate primary versus primary lung cancer. He presented with innumerable and bilateral pulmonary nodules in addition to right hilar and mediastinal lymphadenopathy and metastatic disease to several areas of the bone diagnosed in January 2024. 2) Prostate adenocarcinoma diagnosed in February 2024 currently managed by Dr. Mena Goes   He was started on systemic chemotherapy with carboplatin for an AUC of 5 on day 1, etoposide 100 mg/m on days 1, 2, and 3 and immunotherapy with Imfinzi 1500 mg IV every 3 weeks with Neulasta support.  He is status post cycles of treatments.  Starting from cycle #5, he started maintenance immunotherapy with Imfinzi 1500 mg IV every 4 weeks.  He had elevated AST and ALT in September and October. Therefore, he  was placed on high dose prednisone taper.    The patient was seen with Dr. Arbutus Ped today.  Dr. Arbutus Ped personally and independently reviewed the scan and discussed results with the patient today.  The scan showed no evidence of disease progression.   Dr. Arbutus Ped does not want to resume his treatment at this time due to concerns with liver toxicity.  Therefore, Dr. Arbutus Ped recommends that he continue on observation with a restaging CT scan in 3 months.  If he has progression in the future, he would have to talk about options at that time which may or may not include immunotherapy due to concerns with hepatotoxicity.  His AST is 97 today and his ALT is 156.  Dr. Arbutus Ped recommends repeating his labs in 1 month.  He will continue on his baseline dose of prednisone 5 mg twice daily that he takes for his Zytiga.  Dr. Arbutus Ped would not recommend any additional prednisone taper at this time.  The patient knows to avoid alcohol and Tylenol.  The patient's can also note some persistent cirrhosis.      He is followed by Dr. Mena Goes for his prostate adenocarcinoma which was diagnosed in February 2024 and he is currently receiving Eligard.  Is also on Zytiga.      He will continue taking his antifungal cream for his ringworm infection on his left cheek.  This is improving compared to when he was seen at his last appointment   He was see the patient back in 3 months after his CT scan to review the results in the office.  The patient is mildly tachycardic today his pulse was rechecked at several different points in time during his encounter which ranged from 96-110.  He missed 2 doses of his metoprolol.  He is going to take this upon returning home and ensure improvement in his pulse.  If he continues to experience tachycardia despite being compliant with his medications, he will need to be evaluated.    The patient was advised to call immediately if he has any concerning symptoms in the interval. The patient  voices understanding of current disease status  and treatment options and is in agreement with the current care plan. All questions were answered. The patient knows to call the clinic with any problems, questions or concerns. We can certainly see the patient much sooner if necessary       Orders Placed This Encounter  Procedures   CT CHEST ABDOMEN PELVIS W CONTRAST    Standing Status:   Future    Standing Expiration Date:   05/03/2024    Order Specific Question:   If indicated for the ordered procedure, I authorize the administration of contrast media per Radiology protocol    Answer:   Yes    Order Specific Question:   Does the patient have a contrast media/X-ray dye allergy?    Answer:   No    Order Specific Question:   Preferred imaging location?    Answer:   [facility]    Order Specific Question:   If indicated for the ordered procedure, I authorize the administration of oral contrast media per Radiology protocol    Answer:   Yes   CBC with Differential (Cancer Center Only)    Standing Status:   Future    Standing Expiration Date:   05/03/2024   CMP (Cancer Center only)    Standing Status:   Future    Standing Expiration Date:   05/03/2024   CMP (Cancer Center only)    Standing Status:   Future    Standing Expiration Date:   05/03/2024   EKG 12-Lead     Darya Bigler L Adryan Shin, PA-C 05/04/23  ADDENDUM: Hematology/Oncology Attending: I had a face-to-face encounter with the patient today.  I reviewed his record, lab, scan and recommended his care plan.  This is a very pleasant 77 years old white male with metastatic poorly differentiated carcinoma, neuroendocrine carcinoma of suspicious prostate primary versus primary lung presented with innumerable and bilateral pulmonary nodules in addition to right hilar and mediastinal lymphadenopathy and metastatic disease to several areas of the bone as well as the hypermetabolic activity in the prostate gland diagnosed in January  2024.  He also was diagnosed with prostate adenocarcinoma in February 2024 and currently managed by Dr. Mena Goes with Eligard and [place]. The patient was treated with systemic chemotherapy with carboplatin, etoposide and Imfinzi followed by maintenance treatment with single agent Imfinzi status post 7 cycles but his treatment has been on hold secondary to significant immunotherapy mediated hepatitis treated with a tapered dose of prednisone.  His liver enzyme has improved but there is still residual abnormalities. The patient had repeat CT scan of the chest, abdomen and pelvis performed recently.  I personally and independently reviewed the scan and discussed the result with the patient today. His scan showed no concerning findings for disease progression. I recommended for him to continue on observation with repeat CT scan of the chest in 3 months. Regarding the liver dysfunction, we will repeat his blood work in few weeks to confirm resolution. The patient was advised to call immediately if he has any other concerning symptoms in the interval. The total time spent in the appointment was 30 minutes. Disclaimer: This note was dictated with voice recognition software. Similar sounding words can inadvertently be transcribed and may be missed upon review. Lajuana Matte, MD

## 2023-05-04 ENCOUNTER — Inpatient Hospital Stay: Payer: Medicare Other

## 2023-05-04 ENCOUNTER — Other Ambulatory Visit: Payer: Medicare Other

## 2023-05-04 ENCOUNTER — Ambulatory Visit: Payer: Medicare Other | Admitting: Physician Assistant

## 2023-05-04 ENCOUNTER — Ambulatory Visit: Payer: Medicare Other

## 2023-05-04 ENCOUNTER — Inpatient Hospital Stay: Payer: Medicare Other | Attending: Nurse Practitioner

## 2023-05-04 ENCOUNTER — Inpatient Hospital Stay: Payer: Medicare Other | Admitting: Physician Assistant

## 2023-05-04 VITALS — BP 116/81 | HR 96 | Temp 98.7°F | Resp 18 | Wt 187.6 lb

## 2023-05-04 DIAGNOSIS — C7951 Secondary malignant neoplasm of bone: Secondary | ICD-10-CM | POA: Insufficient documentation

## 2023-05-04 DIAGNOSIS — R7989 Other specified abnormal findings of blood chemistry: Secondary | ICD-10-CM | POA: Diagnosis not present

## 2023-05-04 DIAGNOSIS — C3490 Malignant neoplasm of unspecified part of unspecified bronchus or lung: Secondary | ICD-10-CM

## 2023-05-04 DIAGNOSIS — C7A1 Malignant poorly differentiated neuroendocrine tumors: Secondary | ICD-10-CM

## 2023-05-04 DIAGNOSIS — C61 Malignant neoplasm of prostate: Secondary | ICD-10-CM | POA: Insufficient documentation

## 2023-05-04 DIAGNOSIS — C7800 Secondary malignant neoplasm of unspecified lung: Secondary | ICD-10-CM | POA: Diagnosis not present

## 2023-05-04 DIAGNOSIS — Z79899 Other long term (current) drug therapy: Secondary | ICD-10-CM | POA: Diagnosis not present

## 2023-05-04 DIAGNOSIS — R000 Tachycardia, unspecified: Secondary | ICD-10-CM | POA: Diagnosis not present

## 2023-05-04 DIAGNOSIS — Z960 Presence of urogenital implants: Secondary | ICD-10-CM

## 2023-05-04 LAB — CBC WITH DIFFERENTIAL (CANCER CENTER ONLY)
Abs Immature Granulocytes: 0.06 10*3/uL (ref 0.00–0.07)
Basophils Absolute: 0 10*3/uL (ref 0.0–0.1)
Basophils Relative: 1 %
Eosinophils Absolute: 0.1 10*3/uL (ref 0.0–0.5)
Eosinophils Relative: 1 %
HCT: 41.5 % (ref 39.0–52.0)
Hemoglobin: 14.5 g/dL (ref 13.0–17.0)
Immature Granulocytes: 1 %
Lymphocytes Relative: 18 %
Lymphs Abs: 1.1 10*3/uL (ref 0.7–4.0)
MCH: 34.9 pg — ABNORMAL HIGH (ref 26.0–34.0)
MCHC: 34.9 g/dL (ref 30.0–36.0)
MCV: 100 fL (ref 80.0–100.0)
Monocytes Absolute: 0.4 10*3/uL (ref 0.1–1.0)
Monocytes Relative: 6 %
Neutro Abs: 4.3 10*3/uL (ref 1.7–7.7)
Neutrophils Relative %: 73 %
Platelet Count: 88 10*3/uL — ABNORMAL LOW (ref 150–400)
RBC: 4.15 MIL/uL — ABNORMAL LOW (ref 4.22–5.81)
RDW: 15.8 % — ABNORMAL HIGH (ref 11.5–15.5)
WBC Count: 5.9 10*3/uL (ref 4.0–10.5)
nRBC: 0.3 % — ABNORMAL HIGH (ref 0.0–0.2)

## 2023-05-04 LAB — CMP (CANCER CENTER ONLY)
ALT: 156 U/L — ABNORMAL HIGH (ref 0–44)
AST: 97 U/L — ABNORMAL HIGH (ref 15–41)
Albumin: 3.6 g/dL (ref 3.5–5.0)
Alkaline Phosphatase: 95 U/L (ref 38–126)
Anion gap: 6 (ref 5–15)
BUN: 19 mg/dL (ref 8–23)
CO2: 26 mmol/L (ref 22–32)
Calcium: 9 mg/dL (ref 8.9–10.3)
Chloride: 104 mmol/L (ref 98–111)
Creatinine: 0.98 mg/dL (ref 0.61–1.24)
GFR, Estimated: >60 mL/min (ref >60)
Glucose, Bld: 182 mg/dL — ABNORMAL HIGH (ref 70–99)
Potassium: 3.8 mmol/L (ref 3.5–5.1)
Sodium: 136 mmol/L (ref 135–145)
Total Bilirubin: 1.7 mg/dL — ABNORMAL HIGH (ref <1.2)
Total Protein: 6.5 g/dL (ref 6.5–8.1)

## 2023-05-04 MED ORDER — SODIUM CHLORIDE 0.9% FLUSH
10.0000 mL | Freq: Once | INTRAVENOUS | Status: AC
  Administered 2023-05-04: 10 mL

## 2023-05-17 ENCOUNTER — Telehealth: Payer: Self-pay

## 2023-05-17 ENCOUNTER — Telehealth: Payer: Self-pay | Admitting: Cardiovascular Disease

## 2023-05-17 NOTE — Telephone Encounter (Signed)
Patient identification verified by 2 forms. Marilynn Rail, RN    Called and spoke to patient  Patient states:   -developed swelling in legs last night   -this has not happened before   -has SOB off and on, on going for 6 months, worse with exertion   -continues to take metoprolol   -has never been prescribed diuretics   -Saturday had some chinese Saturday night, first time he developed swelling   -BP 117/77 this morning   -when he pushes on legs, finger print remains   -followed by oncology   -was on a previous prednisone taper   -had to d/c immunotherapy due to elevated liver enzyme  Patient denies:  -worsening SOB   Advised patient to limit salt/added salt in diet  Patient scheduled for OV 11/21 at 8:50am  Reviewed ED warning signs/precautions  Patient verbalized understanding, no questions at this time

## 2023-05-17 NOTE — Telephone Encounter (Signed)
Pt c/o swelling/edema: STAT if pt has developed SOB within 24 hours  If swelling, where is the swelling located? Lower legs and feet  How much weight have you gained and in what time span? Has not checked  Have you gained 2 pounds in a day or 5 pounds in a week? Not sure  Do you have a log of your daily weights (if so, list)? no  Are you currently taking a fluid pill? no  Are you currently SOB? no  Have you traveled recently in a car or plane for an extended period of time? no

## 2023-05-17 NOTE — Telephone Encounter (Signed)
Pt called in with BIL leg and feet swelling. Pt stated that he ate chinese food for the first time on Saturday and a BBQ sandwhich. Pt wears compression socks and on Sunday he reports that he took them off and noticed his swelling. Pt reports 2 weeks post steroid taper pack, energy level is still low but not worse, and SOB comes and goes, but not worse. Pt stated that he is concerned about the liver emzymes and getting them down.  Informed him to continue drinking lots of fluid, eat chicken, fish and [place], and leafy green vegetables and to keep his legs elevated. Pt is following up with cardiologist on 11/21.  Informed him if sxs worsen to go to the ER. Pt verbalized understanding.

## 2023-05-18 NOTE — Progress Notes (Unsigned)
Cardiology Clinic Note   Patient Name: Larry Kent Date of Encounter: 05/20/2023  Primary Care Provider:  [name], MD Primary Cardiologist:  None  Patient Profile    Larry Kent 77 year old male presents to clinic today for evaluation of his lower extremity swelling.  Past Medical History    Past Medical History:  Diagnosis Date   Allergy    Arthritis    both knees    BCC (basal cell carcinoma), arm, right 09/2019   MOHS ([place])   BCC (basal cell carcinoma), face 2014   L preauricular s/p MOHS   BCC (basal cell carcinoma), face 02/2018   L upper lip   BPH (benign prostatic hypertrophy)    Dr. Mena Goes @ [facility]   Carotid stenosis 05/2015   RICA 40-59%, LICA 1-39%, L vertebral occlusion, rpt 1 yr   Cataract    removed both eyes    FHx: colon cancer    FHx: prostate cancer    Heart murmur    mild aortic stenosis    History of kidney stones    Hyperlipidemia    borderline- on rosuvastatin now normal    Occlusion of right vertebral artery 05/2015   Past Surgical History:  Procedure Laterality Date   BASAL CELL CARCINOMA EXCISION  05/2018   lip, 2020 x3  basal cells removed    CATARACT EXTRACTION  2008   Left   CATARACT EXTRACTION  2013   Right ([facility])   COLONOSCOPY  ~2010   medium int hemorrhoids, o/w WNL, rpt 5 yrs given fmhx (Dr. Randa Evens)   COLONOSCOPY  08/2019   TAs, diverticulosis, rpt 3 yrs ([place])   CORONARY/GRAFT ACUTE MI REVASCULARIZATION N/A 09/07/2022   Procedure: Coronary/Graft Acute MI Revascularization;  Surgeon: Orbie Pyo, MD;  Location: [facility];  Service: Cardiovascular;  Laterality: N/A;   ENDARTERECTOMY Right 12/01/2019   Procedure: RIGHT ENDARTERECTOMY CAROTID;  Surgeon: Cephus Shelling, MD;  Location: [facility] OR;  Service: Vascular;  Laterality: Right;   exercise treadmill  11/2005   WNL Eldridge Dace)   FINE NEEDLE ASPIRATION  07/28/2022   Procedure: FINE NEEDLE ASPIRATION (FNA) LINEAR;  Surgeon: Josephine Igo, DO;  Location: [facility];  Service: Pulmonary;;   HERNIA REPAIR  2008   Right   IR IMAGING GUIDED PORT INSERTION  08/11/2022   IR IMAGING GUIDED PORT INSERTION  10/08/2022   IR REMOVAL TUN ACCESS W/ PORT W/O FL MOD SED  09/10/2022   LEFT HEART CATH AND CORONARY ANGIOGRAPHY N/A 09/07/2022   Procedure: LEFT HEART CATH AND CORONARY ANGIOGRAPHY;  Surgeon: Orbie Pyo, MD;  Location: [facility];  Service: Cardiovascular;  Laterality: N/A;   MOHS SURGERY Left spring 2014   basal cell face   PATCH ANGIOPLASTY Right 12/01/2019   Procedure: PATCH ANGIOPLASTY USING Livia Snellen BIOLOGIC PATCH;  Surgeon: Cephus Shelling, MD;  Location: [facility] OR;  Service: Vascular;  Laterality: Right;   Testicular Biopsy  2003   benign, varicocele   US ECHOCARDIOGRAPHY  11/2005   aortic sclerosis, EF 55-60%, diastolic dysfunction   VIDEO BRONCHOSCOPY WITH ENDOBRONCHIAL ULTRASOUND Bilateral 07/28/2022   Procedure: VIDEO BRONCHOSCOPY WITH ENDOBRONCHIAL ULTRASOUND;  Surgeon: Josephine Igo, DO;  Location: [facility];  Service: Pulmonary;  Laterality: Bilateral;    Allergies  No Known Allergies  History of Present Illness    Larry Kent has a PMH of NSTEMI, hyperlipidemia, lung nodule, malignant neoplasm of lung, pancytopenia due to chemotherapy, prostate CA, prediabetes, elevated  LFTs, nonspecific EKG abnormality, and BMI of 25-29.9.  3/24 he presented with chest pain and shortness of breath from the cancer center.  His EKG showed inferior ST elevation.  He was transferred to [facility] and underwent emergent cardiac catheterization which showed thrombotic occlusion of his RCA.  He received 4 overlapping drug-eluting stents.  He was noted to have 50% distal left main stenosis, 60% proximal left circumflex stenosis and 60% mid LAD stenosis.  His echocardiogram at that time was normal.  He was seen in follow-up by Dr. Kirke Corin on 01/27/2023.  During that time he remained stable from a  cardiac standpoint.  He denied chest pain and worsening dyspnea.  He continued to have immunotherapy for neuroendocrine carcinoma and hormone therapy for prostate cancer.  Follow-up was planned for 6 months.  He contacted the nurse triage line on 05/17/2023.  He reported lower extremity swelling.  He indicated that he had eaten [place] food and a barbecue sandwich on Saturday.  Upon removing his lower extremity support stockings he noticed increased swelling.  He presents to the clinic today for evaluation and states he noticed increased lower extremity swelling and shortness of breath as well as fatigue and less endurance over the last several days.  He reported dietary indiscretion.  His weight today is 196.  His last recorded weight on 05/04/2023 was 187 pounds.  He comes in today wearing long socks which has helped reduce his lower extremity swelling.  He reports that he has been trying to be physically active with his recumbent bike but is limited due to his breathing.  He has been trying to ride his recumbent bike for 20 minutes at a time.  He also has finished a course of prednisone.  We reviewed importance of low-sodium diet, maintain physical activity, fluid restriction, and daily weights.  He expressed understanding.  I will give him the Rodriguez Hevia support stocking sheet, prescribe furosemide, potassium, ordered BMP in 1 week and plan follow-up in 2 weeks.  Today he denies chest pain,  palpitations, melena, hematuria, hemoptysis, diaphoresis, weakness, presyncope, syncope, orthopnea, and PND.    Home Medications    Prior to Admission medications   Medication Sig Start Date End Date Taking? Authorizing Provider  abiraterone acetate (ZYTIGA) 250 MG tablet Take 1,000 mg by mouth daily. 10/01/22   Jerilee Field, MD  acetaminophen (TYLENOL) 500 MG tablet Take 1,000 mg by mouth every 6 (six) hours as needed for mild pain or headache.    [provider]  Calcium Carb-Cholecalciferol (RA  CALCIUM PLUS VITAMIN D) 600-10 MG-MCG TABS Take 600 mg by mouth daily.    [provider]  cephALEXin (KEFLEX) 500 MG capsule Take 1 capsule (500 mg total) by mouth 3 (three) times daily. 03/29/23   Bedsole, Amy E, MD  fluticasone (FLONASE) 50 MCG/ACT nasal spray Place 1 spray into both nostrils daily as needed for allergies or rhinitis.    [provider]  lidocaine-prilocaine (EMLA) cream Apply to the Port-A-Cath site 30-60 minutes before chemotherapy treatment Patient taking differently: Apply 1 Application topically See admin instructions. Apply to the Port-A-Cath site 30-60 minutes before chemotherapy treatment 08/03/22   Si Gaul, MD  metoprolol tartrate (LOPRESSOR) 25 MG tablet Take 0.5 tablets (12.5 mg total) by mouth 2 (two) times daily. 09/15/22 09/15/23  Pokhrel, Rebekah Chesterfield, MD  Multiple Vitamin (MULTIVITAMIN) tablet Take 1 tablet by mouth daily with breakfast.    [provider]  nitrofurantoin (MACRODANTIN) 50 MG capsule Take 1 capsule (50 mg  total) by mouth 2 (two) times daily. 02/25/23   Si Gaul, MD  omeprazole (PRILOSEC) 20 MG capsule TAKE 1 CAPSULE BY MOUTH EVERY DAY 04/21/23   Heilingoetter, Cassandra L, PA-C  predniSONE (DELTASONE) 20 MG tablet Please take 4 tablets (80 mg) daily for 7 days, then take 3 tablets daily (60 mg) for 7 days, followed by 2 tablets daily (40 mg) daily for 7 days, followed by 1 tablet daily (20 mg) daily for 1 week, followed by 1/2 tablet for 7 days, then stop 03/30/23   Heilingoetter, Cassandra L, PA-C  predniSONE (DELTASONE) 5 MG tablet Take 5 mg by mouth 2 (two) times daily. 10/01/22   [provider]  prochlorperazine (COMPAZINE) 10 MG tablet Take 1 tablet (10 mg total) by mouth every 6 (six) hours as needed for nausea or vomiting. 10/12/22   Si Gaul, MD  rosuvastatin (CRESTOR) 20 MG tablet Take 1 tablet (20 mg total) by mouth daily. 01/19/23   [name], MD  tamsulosin (FLOMAX) 0.4 MG CAPS capsule  Take 0.4 mg by mouth 2 (two) times daily.    [provider]  ticagrelor (BRILINTA) 90 MG TABS tablet Take 1 tablet (90 mg total) by mouth 2 (two) times daily. 09/15/22 09/15/23  Pokhrel, Rebekah Chesterfield, MD  valACYclovir (VALTREX) 1000 MG tablet Take 1 tablet (1,000 mg total) by mouth 3 (three) times daily. 03/29/23   Bedsole, Amy E, MD  Zinc 25 MG TABS Take 25 mg by mouth daily.    [provider]    Family History    Family History  Problem Relation Age of Onset   Cancer Father 67       prostate   Coronary artery disease Father 56       MI   Prostate cancer Father    Cancer Paternal Grandfather 77       prostate   Prostate cancer Paternal Grandfather    Stroke Mother    Hypertension Mother    Cancer Mother 5       colon   Colon cancer Mother    Coronary artery disease Maternal Uncle    Cancer Sister 25       breast   Breast cancer Sister    Colon cancer Maternal Grandmother    Diabetes Neg Hx    Colon polyps Neg Hx    Esophageal cancer Neg Hx    Rectal cancer Neg Hx    Stomach cancer Neg Hx    He indicated that his mother is deceased. He indicated that his father is deceased. He indicated that the status of his sister is unknown. He indicated that his maternal grandmother is deceased. He indicated that his maternal grandfather is deceased. He indicated that his paternal grandmother is deceased. He indicated that his paternal grandfather is deceased. He indicated that the status of his maternal uncle is unknown. He indicated that the status of his neg hx is unknown.  Social History    Social History   Socioeconomic History   Marital status: Married    Spouse name: Not on file   Number of children: Not on file   Years of education: Not on file   Highest education level: Not on file  Occupational History   Not on file  Tobacco Use   Smoking status: Never   Smokeless tobacco: Never  Vaping Use   Vaping status: Never Used  Substance and Sexual Activity    Alcohol use: Not Currently    Comment: rare  Drug use: No   Sexual activity: Not on file  Other Topics Concern   Not on file  Social History Narrative   Caffeine: 2-3 cups coffee/day   Lives with wife and adult son, 1 dog   Occupation: Warden/ranger   Edu: college   Activity: golf, no regular exercise, occasionally walks with wife   Diet: good amt water, daily fruits/vegetables, fish several times a week   Social Determinants of Health   Financial Resource Strain: Low Risk  (10/06/2022)   Overall Financial Resource Strain (CARDIA)    Difficulty of Paying Living Expenses: Not hard at all  Food Insecurity: No Food Insecurity (10/06/2022)   Hunger Vital Sign    Worried About Running Out of Food in the Last Year: Never true    Ran Out of Food in the Last Year: Never true  Transportation Needs: No Transportation Needs (10/06/2022)   PRAPARE - Administrator, Civil Service (Medical): No    Lack of Transportation (Non-Medical): No  Physical Activity: Inactive (10/06/2022)   Exercise Vital Sign    Days of Exercise per Week: 0 days    Minutes of Exercise per Session: 0 min  Stress: No Stress Concern Present (10/06/2022)   [employer] of Occupational Health - Occupational Stress Questionnaire    Feeling of Stress : Not at all  Social Connections: Moderately Integrated (10/06/2022)   Social Connection and Isolation Panel [NHANES]    Frequency of Communication with Friends and Family: More than three times a week    Frequency of Social Gatherings with Friends and Family: Three times a week    Attends Religious Services: More than 4 times per year    Active Member of Clubs or Organizations: No    Attends Banker Meetings: Never    Marital Status: Married  Catering manager Violence: Not At Risk (10/06/2022)   Humiliation, Afraid, Rape, and Kick questionnaire    Fear of Current or Ex-Partner: No    Emotionally Abused: No    Physically Abused: No    Sexually  Abused: No     Review of Systems    General:  No chills, fever, night sweats or weight changes.  Cardiovascular:  No chest pain, dyspnea on exertion, edema, orthopnea, palpitations, paroxysmal nocturnal dyspnea. Dermatological: No rash, lesions/masses Respiratory: No cough, dyspnea Urologic: No hematuria, dysuria Abdominal:   No nausea, vomiting, diarrhea, bright red blood per rectum, melena, or hematemesis Neurologic:  No visual changes, wkns, changes in mental status. All other systems reviewed and are otherwise negative except as noted above.  Physical Exam    VS:  BP 110/68 (BP Location: Left Arm, Patient Position: Sitting, Cuff Size: Normal)   Pulse 97   Ht 5\' 8"  (1.727 m)   Wt 196 lb (88.9 kg)   SpO2 94%   BMI 29.80 kg/m  , BMI Body mass index is 29.8 kg/m. GEN: Well nourished, well developed, in no acute distress. HEENT: normal. Neck: Supple, no JVD, carotid bruits, or masses. Cardiac: RRR, no murmurs, rubs, or gallops. No clubbing, cyanosis, edema.  Radials/DP/PT 2+ and equal bilaterally.  Respiratory:  Respirations regular and unlabored, clear to auscultation bilaterally. GI: Soft, nontender, nondistended, BS + x 4. MS: no deformity or atrophy. Skin: warm and dry, no rash. Neuro:  Strength and sensation are intact. Psych: Normal affect.  Accessory Clinical Findings    Recent Labs: 09/15/2022: Magnesium 1.8 01/26/2023: TSH 1.762 05/04/2023: ALT 156; BUN 19; Creatinine 0.98; Hemoglobin 14.5; Platelet  Count 88; Potassium 3.8; Sodium 136   Recent Lipid Panel    Component Value Date/Time   CHOL 139 12/22/2022 0811   CHOL 128 09/12/2018 1118   TRIG 63.0 12/22/2022 0811   HDL 62.50 12/22/2022 0811   HDL 57 09/12/2018 1118   CHOLHDL 2 12/22/2022 0811   VLDL 12.6 12/22/2022 0811   LDLCALC 64 12/22/2022 0811   LDLCALC 57 09/12/2018 1118   LDLDIRECT 144.8 02/09/2012 0928         ECG personally reviewed by me today-none today.    Echocardiogram  09/08/2022 IMPRESSIONS     1. Left ventricular ejection fraction, by estimation, is 65 to 70%. The  left ventricle has normal function. The left ventricle demonstrates  regional wall motion abnormalities (see scoring diagram/findings for  description). There is moderate concentric  left ventricular hypertrophy. Left ventricular diastolic parameters are  indeterminate.   2. Right ventricular systolic function is normal. The right ventricular  size is moderately enlarged. There is normal pulmonary artery systolic  pressure.   3. Right atrial size was mildly dilated.   4. The mitral valve is degenerative. No evidence of mitral valve  regurgitation. No evidence of mitral stenosis. The mean mitral valve  gradient is 3.0 mmHg with average heart rate of 66 bpm. Severe mitral  annular calcification.   5. Mixed valve disease. Central aortic regurgitation, normal LV stroke  volume index. The aortic valve is calcified. Aortic valve regurgitation is  mild to moderate. Mild aortic valve stenosis. Aortic valve mean gradient  measures 14.0 mmHg.   6. The inferior vena cava is normal in size with greater than 50%  respiratory variability, suggesting right atrial pressure of 3 mmHg.   Comparison(s): Prior images reviewed side by side. Similar aortic  findings. LV wall motion comparison is difficult- WMA seen best on  contrast images, prior contrast images not performed.   FINDINGS   Left Ventricle: Left ventricular ejection fraction, by estimation, is 65  to 70%. The left ventricle has normal function. The left ventricle  demonstrates regional wall motion abnormalities. The left ventricular  internal cavity size was normal in size.  There is moderate concentric left ventricular hypertrophy. Left  ventricular diastolic parameters are indeterminate.     LV Wall Scoring:  The mid and distal anterior septum and mid inferoseptal segment are  hypokinetic.   Right Ventricle: The right ventricular  size is moderately enlarged. No  increase in right ventricular wall thickness. Right ventricular systolic  function is normal. There is normal pulmonary artery systolic pressure.  The tricuspid regurgitant velocity is  2.00 m/s, and with an assumed right atrial pressure of 3 mmHg, the  estimated right ventricular systolic pressure is 19.0 mmHg.   Left Atrium: Left atrial size was normal in size.   Right Atrium: Right atrial size was mildly dilated.   Pericardium: There is no evidence of pericardial effusion.   Mitral Valve: The mitral valve is degenerative in appearance. Severe  mitral annular calcification. No evidence of mitral valve regurgitation.  No evidence of mitral valve stenosis. MV peak gradient, 9.5 mmHg. The mean  mitral valve gradient is 3.0 mmHg  with average heart rate of 66 bpm.   Tricuspid Valve: The tricuspid valve is normal in structure. Tricuspid  valve regurgitation is trivial. No evidence of tricuspid stenosis.   Aortic Valve: Mixed valve disease. Central aortic regurgitation, normal LV  stroke volume index. The aortic valve is calcified. Aortic valve  regurgitation is mild to moderate. Aortic regurgitation  PHT measures 651  msec. Mild aortic stenosis is present.  Aortic valve mean gradient measures 14.0 mmHg. Aortic valve peak gradient  measures 21.9 mmHg. Aortic valve area, by VTI measures 2.18 cm.   Pulmonic Valve: The pulmonic valve was not well visualized. Pulmonic valve  regurgitation is not visualized.   Aorta: The aortic root and ascending aorta are structurally normal, with  no evidence of dilitation.   Venous: The inferior vena cava is normal in size with greater than 50%  respiratory variability, suggesting right atrial pressure of 3 mmHg.   IAS/Shunts: No atrial level shunt detected by color flow Doppler.    Cardiac PET/CT 12/15/2022  The study is grossly normal. The study is low risk.  LV perfusion is normal.  Rest left ventricular  function is normal. Rest EF: 55 %. Stress left ventricular function is normal. Stress EF: 60 %. End diastolic cavity size is normal. End systolic cavity size is normal.  Myocardial blood flow was computed to be 0.35ml/g/min at rest and 1.33ml/g/min at stress. Global myocardial blood flow reserve was 1.86 and was mildly abnormal. This finding is less sensitive in the setting of prior coronary intervention.  Coronary calcium assessment not performed due to prior revascularization. Aortic atherosclerosis and aortic valve calcium noted.  Electronically Signed By: Riley Lam M.D.  CLINICAL DATA: This over-read does not include interpretation of cardiac or coronary anatomy or pathology. The Cardiac PET CT interpretation by the cardiologist is attached.  COMPARISON: 11/02/2022  FINDINGS: Vascular: No acute abnormality.  Mediastinum/Nodes: Previous index subcarinal lymph node measures 1.2 cm, image 13/3. Formally 1.4 cm.  Lungs/Pleura: No pleural effusion, airspace consolidation or pneumothorax.  BILATERAL PULMONARY NODULES ARE AGAIN SEEN COMPATIBLE WITH KNOWN METASTATIC DISEASE.: BILATERAL PULMONARY NODULES ARE AGAIN SEEN COMPATIBLE WITH KNOWN METASTATIC DISEASE. For example:  -Index nodule within the right middle lobe measures 1.3 cm, image 38/4. Stable from previous exam.  -Posterior left upper lobe lung nodule measures 1.1 cm, image 6/4. Previously 1.3 cm.  Upper Abdomen: No acute abnormality.  Musculoskeletal: No acute or suspicious osseous findings.  IMPRESSION: 1. No acute findings identified within the chest. 2. Bilateral pulmonary nodules are again seen compatible with known metastatic disease. These are not significantly changed in the interval. 3. Mild decrease in size of subcarinal adenopathy.   Electronically Signed By: Signa Kell M.D. On: 12/15/2022 12:37      Assessment & Plan   1.  Bilateral lower extremity edema-generalized bilateral lower  extremity  pittin.1+ ankle pitting Start lasix 20 mg x 3 days and 10 meq of K for three days then stop Heart healthy low-sodium diet-salty 6 diet sheet given Elevate legs when not active Continue lower extremity support stockings BMP in 1 week  Daily weights and with log  Lower extremity support stockings  Low-sodium diet-salty 6 diet sheet given  Hyperlipidemia-LDL 64 on 12/22/22. High-fiber diet Continue rosuvastatin Maintain physical activity as tolerated  Coronary artery disease-no chest pain today.  Reassuring cardiac PET/CT 6/24.  Details above. Continue current medical therapy  Aortic valve sclerosis-echocardiogram 09/08/2022 showed normal LV function moderate LVH, intermediate diastolic parameters mildly dilated right atria and mild aortic valve stenosis with a mean gradient of 14 mmHg. Plan for repeat echocardiogram 3/25  Disposition: Follow-up with Dr. Kirke Corin or APP in 2 weeks   Thomasene Ripple. Mikhia Dusek NP-C     05/20/2023, 9:52 AM [facility] [address] Office [phone] Fax [phone]    I spent 13 minutes examining this  patient, reviewing medications, and using patient centered shared decision making involving her cardiac care.   I spent greater than 20 minutes reviewing her past medical history,  medications, and prior cardiac tests.

## 2023-05-18 NOTE — Telephone Encounter (Signed)
I agree that this requires an office evaluation.  No further recommendations until then.

## 2023-05-20 ENCOUNTER — Ambulatory Visit: Payer: Medicare Other | Attending: General Practice | Admitting: General Practice

## 2023-05-20 ENCOUNTER — Encounter: Payer: Self-pay | Admitting: General Practice

## 2023-05-20 VITALS — BP 110/68 | HR 97 | Ht 68.0 in | Wt 196.0 lb

## 2023-05-20 DIAGNOSIS — I2510 Atherosclerotic heart disease of native coronary artery without angina pectoris: Secondary | ICD-10-CM

## 2023-05-20 DIAGNOSIS — I350 Nonrheumatic aortic (valve) stenosis: Secondary | ICD-10-CM

## 2023-05-20 DIAGNOSIS — E785 Hyperlipidemia, unspecified: Secondary | ICD-10-CM | POA: Diagnosis not present

## 2023-05-20 DIAGNOSIS — R600 Localized edema: Secondary | ICD-10-CM

## 2023-05-20 MED ORDER — FUROSEMIDE 20 MG PO TABS: 20.0000 mg | ORAL_TABLET | Freq: Every day | ORAL | 0 refills | Status: DC

## 2023-05-20 MED ORDER — POTASSIUM CHLORIDE ER 10 MEQ PO TBCR: 10.0000 meq | EXTENDED_RELEASE_TABLET | Freq: Every day | ORAL | 3 refills | Status: DC

## 2023-05-20 NOTE — Patient Instructions (Addendum)
Medication Instructions:  TAKE YOUR FUROSEMIDE 20MG  FOR 3 DAYS THEN STOP TAKE POTASSIUM  FOR 3 DAYS THEN STOP *If you need a refill on your cardiac medications before your next appointment, please call your pharmacy*  Lab Work: BMET IN 1 WEEK If you have labs (blood work) drawn today and your tests are completely normal, you will receive your results only by:  MyChart Message (if you have MyChart) OR  A paper copy in the mail If you have any lab test that is abnormal or we need to change your treatment, we will call you to review the results.  Other Instructions TAKE AND LOG YOUR WEIGHT DAILY PURCHASE AND WEAR COMPRESSION STOCKINGS PLEASE READ AND FOLLOW ATTACHED SALTY 6   Follow-Up: At [facility], you and your health needs are our priority.  As part of our continuing mission to provide you with exceptional heart care, we have created designated Provider Care Teams.  These Care Teams include your primary Cardiologist (physician) and Advanced Practice Providers (APPs -  Physician Assistants and Nurse Practitioners) who all work together to provide you with the care you need, when you need it.  Your next appointment:   2-3 week(s)  Provider:   Peter [place], MD  or Edd Fabian, FNP        How to Use Compression Stockings  Compression stockings are elastic socks that help increase blood flow (circulation) to the legs, decrease swelling in the legs, and reduce the chance of developing blood clots in the lower legs. Please make sure to remove before your bedtime.  Compression stockings squeeze or apply pressure to the legs. The stockings are graduated, meaning the highest amount of pressure occurs at the toes and it decreases going toward the upper part of the leg. This helps ensure proper circulation through the veins. Compression stockings are often used by people who: Are recovering from surgery. The stockings help prevent blood clots after surgery. Have poor circulation  or swelling in their legs because of a medical condition, such as chronic venous insufficiency, venous stasis, or lymphedema. Have a history of getting blood clots in their legs. Have bulging (varicose) veins. Sit or stay in bed for long periods of time (immobilization). Stand for long periods of time and experience leg pain or fatigue. Follow instructions from your health care provider about how and when to wear your compression stockings. What are the risks? Generally, compression stockings are safe to wear. However, problems may occur for some people, such as: The stockings being ineffective at increasing the circulation to the legs, decreasing swelling in the legs, or reducing the chance of developing blood clots in the lower legs. Skin complications, including breaks in the skin, open wounds, blisters, or dermatitis. How to wear compression stockings Before you put on your compression stockings: Make sure that they are the correct size and degree of compression. If you do not know your size or required grade of compression, ask your health care provider and follow the manufacturer's instructions that come with the stockings. Be sure they are the appropriate length for your medical needs. Compression stockings come in different lengths, including knee high, thigh high, and even up to the waist. Make sure that the stockings are clean, dry, and in good condition. Check the stockings for rips and tears. Do not put them on if they are ripped or torn. Put your stockings on first thing in the morning, before you get out of bed. Keep them on for as long  as your health care provider advises. Most people are told to remove their compression stockings at the end of the day before bed. When you are wearing your stockings: Keep them as smooth as possible. Do not allow them to bunch up. It is especially important to prevent the stockings from bunching up around your toes or behind your knees. Make sure  that the toe holes are underneath the toes and the heel patches are positioned at the heels. Do not roll the stockings downward and leave them rolled down. This can decrease blood flow to your legs. Change the stockings right away if they become wet or dirty or if they have a bad smell. If you have chronic leg wounds, make sure the wounds are properly covered or dressed before putting on your compression stockings. When you take off your stockings, check your legs and feet for: Open sores. Red spots or other areas of discoloration. Swelling. General tips Do not stop wearing compression stockings. Talk to your health care provider if your stockings feel too tight. Wash your stockings often with mild detergent in cold or warm water. Also wash them whenever they get dirty or have a bad smell. Do not use bleach. Air-dry your stockings or dry them in a clothes dryer on low heat. It may be helpful to have two pairs so that you have a pair to wear while the other is being washed. Replace your stockings every 3-6 months. If skin moisturizing is part of your treatment plan, apply lotion or cream at night so that your skin will be dry when you put on the stockings in the morning. It is harder to put the stockings on when you have lotion on your legs or feet. Wear nonskid shoes or slip-resistant socks when walking while wearing compression stockings. If you have difficulty putting on or taking off the compression stockings, ask your health care provider about devices that may help make this easier. Contact a health care provider and remove your stockings if: You have a prickling or tingling feeling in your feet or legs. You have new open sores, red spots, or other skin changes on your feet or legs. You have swelling or pain that gets worse. Get help right away if: You have shortness of breath or chest pain. Your heartbeat is fast or irregular. You have new swelling, pain, or warmth in your leg. You have  numbness or tingling in your lower legs that does not get better after you take the stockings off. Your toes or feet are unusually cold or turn a bluish color. You feel light-headed or dizzy. These symptoms may represent a serious problem that is an emergency. Do not wait to see if the symptoms will go away. Get medical help right away. Call your local emergency services (911 in the [place] Do not drive yourself to the hospital. Summary Compression stockings are elastic socks that are worn to treat a variety of symptoms and medical conditions such as venous insufficiency, venous stasis, or lymphedema. Compression stockings help increase blood flow (circulation) to the legs, decrease swelling in the legs, and reduce the chance of developing blood clots in the lower legs. Follow instructions from your health care provider about how and when to wear your compression stockings. Do not stop wearing your compression stockings without talking to your health care provider first. This information is not intended to replace advice given to you by your health care provider. Make sure you discuss any questions you have with your  health care provider. Document Revised: 12/05/2020 Document Reviewed: 12/05/2020 [employer]vier Patient Education  2024 [employer].

## 2023-05-31 DIAGNOSIS — E785 Hyperlipidemia, unspecified: Secondary | ICD-10-CM | POA: Diagnosis not present

## 2023-05-31 DIAGNOSIS — R600 Localized edema: Secondary | ICD-10-CM | POA: Diagnosis not present

## 2023-05-31 DIAGNOSIS — I350 Nonrheumatic aortic (valve) stenosis: Secondary | ICD-10-CM | POA: Diagnosis not present

## 2023-05-31 DIAGNOSIS — I2510 Atherosclerotic heart disease of native coronary artery without angina pectoris: Secondary | ICD-10-CM | POA: Diagnosis not present

## 2023-06-01 ENCOUNTER — Encounter: Payer: Self-pay | Admitting: Internal Medicine

## 2023-06-01 ENCOUNTER — Other Ambulatory Visit: Payer: Medicare Other

## 2023-06-01 ENCOUNTER — Ambulatory Visit: Payer: Medicare Other

## 2023-06-01 ENCOUNTER — Ambulatory Visit: Payer: Medicare Other | Admitting: Physician Assistant

## 2023-06-01 LAB — BASIC METABOLIC PANEL
BUN/Creatinine Ratio: 15 (ref 10–24)
BUN: 14 mg/dL (ref 8–27)
CO2: 22 mmol/L (ref 20–29)
Calcium: 9.1 mg/dL (ref 8.6–10.2)
Chloride: 105 mmol/L (ref 96–106)
Creatinine, Ser: 0.94 mg/dL (ref 0.76–1.27)
Glucose: 143 mg/dL — ABNORMAL HIGH (ref 70–99)
Potassium: 4 mmol/L (ref 3.5–5.2)
Sodium: 140 mmol/L (ref 134–144)
eGFR: 83 mL/min/{1.73_m2} (ref >59)

## 2023-06-03 ENCOUNTER — Inpatient Hospital Stay: Payer: Medicare Other | Attending: Nurse Practitioner

## 2023-06-03 ENCOUNTER — Other Ambulatory Visit: Payer: Self-pay | Admitting: Medical Oncology

## 2023-06-03 ENCOUNTER — Telehealth: Payer: Self-pay | Admitting: Medical Oncology

## 2023-06-03 ENCOUNTER — Other Ambulatory Visit: Payer: Self-pay | Admitting: Physician Assistant

## 2023-06-03 ENCOUNTER — Encounter: Payer: Self-pay | Admitting: Internal Medicine

## 2023-06-03 DIAGNOSIS — C3490 Malignant neoplasm of unspecified part of unspecified bronchus or lung: Secondary | ICD-10-CM

## 2023-06-03 DIAGNOSIS — T50905A Adverse effect of unspecified drugs, medicaments and biological substances, initial encounter: Secondary | ICD-10-CM

## 2023-06-03 DIAGNOSIS — C7951 Secondary malignant neoplasm of bone: Secondary | ICD-10-CM | POA: Insufficient documentation

## 2023-06-03 DIAGNOSIS — C61 Malignant neoplasm of prostate: Secondary | ICD-10-CM | POA: Insufficient documentation

## 2023-06-03 DIAGNOSIS — C7A1 Malignant poorly differentiated neuroendocrine tumors: Secondary | ICD-10-CM

## 2023-06-03 DIAGNOSIS — Z960 Presence of urogenital implants: Secondary | ICD-10-CM

## 2023-06-03 LAB — CBC WITH DIFFERENTIAL (CANCER CENTER ONLY)
Abs Immature Granulocytes: 0.02 10*3/uL (ref 0.00–0.07)
Basophils Absolute: 0 10*3/uL (ref 0.0–0.1)
Basophils Relative: 1 %
Eosinophils Absolute: 0 10*3/uL (ref 0.0–0.5)
Eosinophils Relative: 1 %
HCT: 34.2 % — ABNORMAL LOW (ref 39.0–52.0)
Hemoglobin: 11.6 g/dL — ABNORMAL LOW (ref 13.0–17.0)
Immature Granulocytes: 0 %
Lymphocytes Relative: 14 %
Lymphs Abs: 0.9 10*3/uL (ref 0.7–4.0)
MCH: 35 pg — ABNORMAL HIGH (ref 26.0–34.0)
MCHC: 33.9 g/dL (ref 30.0–36.0)
MCV: 103.3 fL — ABNORMAL HIGH (ref 80.0–100.0)
Monocytes Absolute: 0.3 10*3/uL (ref 0.1–1.0)
Monocytes Relative: 5 %
Neutro Abs: 5 10*3/uL (ref 1.7–7.7)
Neutrophils Relative %: 79 %
Platelet Count: 125 10*3/uL — ABNORMAL LOW (ref 150–400)
RBC: 3.31 MIL/uL — ABNORMAL LOW (ref 4.22–5.81)
RDW: 15 % (ref 11.5–15.5)
WBC Count: 6.3 10*3/uL (ref 4.0–10.5)
nRBC: 0 % (ref 0.0–0.2)

## 2023-06-03 LAB — CMP (CANCER CENTER ONLY)
ALT: 236 U/L — ABNORMAL HIGH (ref 0–44)
AST: 271 U/L (ref 15–41)
Albumin: 3.2 g/dL — ABNORMAL LOW (ref 3.5–5.0)
Alkaline Phosphatase: 101 U/L (ref 38–126)
Anion gap: 3 — ABNORMAL LOW (ref 5–15)
BUN: 16 mg/dL (ref 8–23)
CO2: 24 mmol/L (ref 22–32)
Calcium: 8.8 mg/dL — ABNORMAL LOW (ref 8.9–10.3)
Chloride: 110 mmol/L (ref 98–111)
Creatinine: 0.93 mg/dL (ref 0.61–1.24)
GFR, Estimated: >60 mL/min (ref >60)
Glucose, Bld: 225 mg/dL — ABNORMAL HIGH (ref 70–99)
Potassium: 3.9 mmol/L (ref 3.5–5.1)
Sodium: 137 mmol/L (ref 135–145)
Total Bilirubin: 1.3 mg/dL — ABNORMAL HIGH (ref <1.2)
Total Protein: 5.8 g/dL — ABNORMAL LOW (ref 6.5–8.1)

## 2023-06-03 MED ORDER — SODIUM CHLORIDE 0.9% FLUSH
10.0000 mL | Freq: Once | INTRAVENOUS | Status: AC
  Administered 2023-06-03: 10 mL

## 2023-06-03 MED ORDER — HEPARIN SOD (PORK) LOCK FLUSH 100 UNIT/ML IV SOLN
500.0000 [IU] | Freq: Once | INTRAVENOUS | Status: AC
Start: 2023-06-03 — End: 2023-06-03
  Administered 2023-06-03: 500 [IU]

## 2023-06-03 MED ORDER — PREDNISONE 20 MG PO TABS: ORAL_TABLET | ORAL | 0 refills | Status: DC

## 2023-06-03 NOTE — Telephone Encounter (Signed)
Discussed with pt his liver results. He will start prednisone.

## 2023-06-03 NOTE — Telephone Encounter (Signed)
Larry Kent requested a referral for his abnormal LFT.

## 2023-06-03 NOTE — Progress Notes (Signed)
CRITICAL VALUE STICKER  CRITICAL VALUE:AST=271, ALT==236  RECEIVER (on-site recipient of call):Shandreka Dante  DATE & TIME NOTIFIED: 06/03/23 @ 1530  MESSENGER (representative from lab):Heather  MD NOTIFIED: Arbutus Ped  TIME OF NOTIFICATION:1532  RESPONSE:  Medrol dose pack ordered. Pt instructed to start taking medication.

## 2023-06-07 ENCOUNTER — Telehealth: Payer: Self-pay | Admitting: Medical Oncology

## 2023-06-07 NOTE — Progress Notes (Unsigned)
Cardiology Clinic Note   Patient Name: Larry Kent Date of Encounter: 06/07/2023  Primary Care Provider:  [name], MD Primary Cardiologist:  Peter Swaziland, MD  Patient Profile    Larry Kent 77 year old male presents to clinic today for evaluation of his lower extremity swelling.  Past Medical History    Past Medical History:  Diagnosis Date   Allergy    Arthritis    both knees    BCC (basal cell carcinoma), arm, right 09/2019   MOHS ([place])   BCC (basal cell carcinoma), face 2014   L preauricular s/p MOHS   BCC (basal cell carcinoma), face 02/2018   L upper lip   BPH (benign prostatic hypertrophy)    Dr. Mena Goes @ [facility]   Carotid stenosis 05/2015   RICA 40-59%, LICA 1-39%, L vertebral occlusion, rpt 1 yr   Cataract    removed both eyes    FHx: colon cancer    FHx: prostate cancer    Heart murmur    mild aortic stenosis    History of kidney stones    Hyperlipidemia    borderline- on rosuvastatin now normal    Occlusion of right vertebral artery 05/2015   Past Surgical History:  Procedure Laterality Date   BASAL CELL CARCINOMA EXCISION  05/2018   lip, 2020 x3  basal cells removed    CATARACT EXTRACTION  2008   Left   CATARACT EXTRACTION  2013   Right ([facility])   COLONOSCOPY  ~2010   medium int hemorrhoids, o/w WNL, rpt 5 yrs given fmhx (Dr. Randa Evens)   COLONOSCOPY  08/2019   TAs, diverticulosis, rpt 3 yrs ([place])   CORONARY/GRAFT ACUTE MI REVASCULARIZATION N/A 09/07/2022   Procedure: Coronary/Graft Acute MI Revascularization;  Surgeon: Orbie Pyo, MD;  Location: [facility];  Service: Cardiovascular;  Laterality: N/A;   ENDARTERECTOMY Right 12/01/2019   Procedure: RIGHT ENDARTERECTOMY CAROTID;  Surgeon: Cephus Shelling, MD;  Location: [facility] OR;  Service: Vascular;  Laterality: Right;   exercise treadmill  11/2005   WNL Eldridge Dace)   FINE NEEDLE ASPIRATION  07/28/2022   Procedure: FINE NEEDLE ASPIRATION (FNA) LINEAR;   Surgeon: Josephine Igo, DO;  Location: [facility] ENDOSCOPY;  Service: Pulmonary;;   HERNIA REPAIR  2008   Right   IR IMAGING GUIDED PORT INSERTION  08/11/2022   IR IMAGING GUIDED PORT INSERTION  10/08/2022   IR REMOVAL TUN ACCESS W/ PORT W/O FL MOD SED  09/10/2022   LEFT HEART CATH AND CORONARY ANGIOGRAPHY N/A 09/07/2022   Procedure: LEFT HEART CATH AND CORONARY ANGIOGRAPHY;  Surgeon: Orbie Pyo, MD;  Location: [facility];  Service: Cardiovascular;  Laterality: N/A;   MOHS SURGERY Left spring 2014   basal cell face   PATCH ANGIOPLASTY Right 12/01/2019   Procedure: PATCH ANGIOPLASTY USING Livia Snellen BIOLOGIC PATCH;  Surgeon: Cephus Shelling, MD;  Location: [facility] OR;  Service: Vascular;  Laterality: Right;   Testicular Biopsy  2003   benign, varicocele   US ECHOCARDIOGRAPHY  11/2005   aortic sclerosis, EF 55-60%, diastolic dysfunction   VIDEO BRONCHOSCOPY WITH ENDOBRONCHIAL ULTRASOUND Bilateral 07/28/2022   Procedure: VIDEO BRONCHOSCOPY WITH ENDOBRONCHIAL ULTRASOUND;  Surgeon: Josephine Igo, DO;  Location: [facility] ENDOS[facility];  Service: Pulmonary;  Laterality: Bilateral;    Allergies  No Known Allergies  History of Present Illness    Larry Kent has a PMH of NSTEMI, hyperlipidemia, lung nodule, malignant neoplasm of lung, pancytopenia due to chemotherapy, prostate CA,  prediabetes, elevated LFTs, nonspecific EKG abnormality, and BMI of 25-29.9.  3/24 he presented with chest pain and shortness of breath from the cancer center.  His EKG showed inferior ST elevation.  He was transferred to [facility] and underwent emergent cardiac catheterization which showed thrombotic occlusion of his RCA.  He received 4 overlapping drug-eluting stents.  He was noted to have 50% distal left main stenosis, 60% proximal left circumflex stenosis and 60% mid LAD stenosis.  His echocardiogram at that time was normal.  He was seen in follow-up by Dr. Kirke Corin on 01/27/2023.  During that time he remained  stable from a cardiac standpoint.  He denied chest pain and worsening dyspnea.  He continued to have immunotherapy for neuroendocrine carcinoma and hormone therapy for prostate cancer.  Follow-up was planned for 6 months.  He contacted the nurse triage line on 05/17/2023.  He reported lower extremity swelling.  He indicated that he had eaten [place] food and a barbecue sandwich on Saturday.  Upon removing his lower extremity support stockings he noticed increased swelling.  He presented to the clinic 05/20/23 for evaluation and stated he noticed increased lower extremity swelling and shortness of breath as well as fatigue and less endurance over the last several days.  He reported dietary indiscretion.  His weight was 196.  His last recorded weight on 05/04/2023 was 187 pounds.  He presented  wearing long socks which had helped reduce his lower extremity swelling.  He reported that he had been trying to be physically active with his recumbent bike but was limited due to his breathing.  He had been trying to ride his recumbent bike for 20 minutes at a time.  He also had finished a course of prednisone.  We reviewed importance of low-sodium diet, maintain physical activity, fluid restriction, and daily weights.  He expressed understanding.  I gave him the [facility] support stocking sheet, prescribed furosemide, potassium, ordered BMP in 1 week and plan follow-up in 2 weeks.  Follow-up BMP showed stable creatinine at 0.94 and potassium of 4.0.  He presents to the clinic today for follow-up evaluation and states***.  Today he denies chest pain,  palpitations, melena, hematuria, hemoptysis, diaphoresis, weakness, presyncope, syncope, orthopnea, and PND.    Home Medications    Prior to Admission medications   Medication Sig Start Date End Date Taking? Authorizing Provider  abiraterone acetate (ZYTIGA) 250 MG tablet Take 1,000 mg by mouth daily. 10/01/22   Jerilee Field, MD  acetaminophen (TYLENOL) 500  MG tablet Take 1,000 mg by mouth every 6 (six) hours as needed for mild pain or headache.    [provider]  Calcium Carb-Cholecalciferol (RA CALCIUM PLUS VITAMIN D) 600-10 MG-MCG TABS Take 600 mg by mouth daily.    [provider]  cephALEXin (KEFLEX) 500 MG capsule Take 1 capsule (500 mg total) by mouth 3 (three) times daily. 03/29/23   Bedsole, Amy E, MD  fluticasone (FLONASE) 50 MCG/ACT nasal spray Place 1 spray into both nostrils daily as needed for allergies or rhinitis.    [provider]  lidocaine-prilocaine (EMLA) cream Apply to the Port-A-Cath site 30-60 minutes before chemotherapy treatment Patient taking differently: Apply 1 Application topically See admin instructions. Apply to the Port-A-Cath site 30-60 minutes before chemotherapy treatment 08/03/22   Si Gaul, MD  metoprolol tartrate (LOPRESSOR) 25 MG tablet Take 0.5 tablets (12.5 mg total) by mouth 2 (two) times daily. 09/15/22 09/15/23  Pokhrel, Rebekah Chesterfield, MD  Multiple Vitamin (MULTIVITAMIN) tablet Take 1  tablet by mouth daily with breakfast.    [provider]  nitrofurantoin (MACRODANTIN) 50 MG capsule Take 1 capsule (50 mg total) by mouth 2 (two) times daily. 02/25/23   Si Gaul, MD  omeprazole (PRILOSEC) 20 MG capsule TAKE 1 CAPSULE BY MOUTH EVERY DAY 04/21/23   Heilingoetter, Cassandra L, PA-C  predniSONE (DELTASONE) 20 MG tablet Please take 4 tablets (80 mg) daily for 7 days, then take 3 tablets daily (60 mg) for 7 days, followed by 2 tablets daily (40 mg) daily for 7 days, followed by 1 tablet daily (20 mg) daily for 1 week, followed by 1/2 tablet for 7 days, then stop 03/30/23   Heilingoetter, Cassandra L, PA-C  predniSONE (DELTASONE) 5 MG tablet Take 5 mg by mouth 2 (two) times daily. 10/01/22   [provider]  prochlorperazine (COMPAZINE) 10 MG tablet Take 1 tablet (10 mg total) by mouth every 6 (six) hours as needed for nausea or vomiting. 10/12/22   Si Gaul, MD   rosuvastatin (CRESTOR) 20 MG tablet Take 1 tablet (20 mg total) by mouth daily. 01/19/23   [name], MD  tamsulosin (FLOMAX) 0.4 MG CAPS capsule Take 0.4 mg by mouth 2 (two) times daily.    [provider]  ticagrelor (BRILINTA) 90 MG TABS tablet Take 1 tablet (90 mg total) by mouth 2 (two) times daily. 09/15/22 09/15/23  Pokhrel, Rebekah Chesterfield, MD  valACYclovir (VALTREX) 1000 MG tablet Take 1 tablet (1,000 mg total) by mouth 3 (three) times daily. 03/29/23   Bedsole, Amy E, MD  Zinc 25 MG TABS Take 25 mg by mouth daily.    [provider]    Family History    Family History  Problem Relation Age of Onset   Cancer Father 45       prostate   Coronary artery disease Father 70       MI   Prostate cancer Father    Cancer Paternal Grandfather 14       prostate   Prostate cancer Paternal Grandfather    Stroke Mother    Hypertension Mother    Cancer Mother 48       colon   Colon cancer Mother    Coronary artery disease Maternal Uncle    Cancer Sister 33       breast   Breast cancer Sister    Colon cancer Maternal Grandmother    Diabetes Neg Hx    Colon polyps Neg Hx    Esophageal cancer Neg Hx    Rectal cancer Neg Hx    Stomach cancer Neg Hx    He indicated that his mother is deceased. He indicated that his father is deceased. He indicated that the status of his sister is unknown. He indicated that his maternal grandmother is deceased. He indicated that his maternal grandfather is deceased. He indicated that his paternal grandmother is deceased. He indicated that his paternal grandfather is deceased. He indicated that the status of his maternal uncle is unknown. He indicated that the status of his neg hx is unknown.  Social History    Social History   Socioeconomic History   Marital status: Married    Spouse name: Not on file   Number of children: Not on file   Years of education: Not on file   Highest education level: Not on file  Occupational History   Not  on file  Tobacco Use   Smoking status: Never   Smokeless tobacco: Never  Vaping Use  Vaping status: Never Used  Substance and Sexual Activity   Alcohol use: Not Currently    Comment: rare   Drug use: No   Sexual activity: Not on file  Other Topics Concern   Not on file  Social History Narrative   Caffeine: 2-3 cups coffee/day   Lives with wife and adult son, 1 dog   Occupation: Warden/ranger   Edu: college   Activity: golf, no regular exercise, occasionally walks with wife   Diet: good amt water, daily fruits/vegetables, fish several times a week   Social Determinants of Health   Financial Resource Strain: Low Risk  (10/06/2022)   Overall Financial Resource Strain (CARDIA)    Difficulty of Paying Living Expenses: Not hard at all  Food Insecurity: No Food Insecurity (10/06/2022)   Hunger Vital Sign    Worried About Running Out of Food in the Last Year: Never true    Ran Out of Food in the Last Year: Never true  Transportation Needs: No Transportation Needs (10/06/2022)   PRAPARE - Administrator, Civil Service (Medical): No    Lack of Transportation (Non-Medical): No  Physical Activity: Inactive (10/06/2022)   Exercise Vital Sign    Days of Exercise per Week: 0 days    Minutes of Exercise per Session: 0 min  Stress: No Stress Concern Present (10/06/2022)   [employer] of Occupational Health - Occupational Stress Questionnaire    Feeling of Stress : Not at all  Social Connections: Moderately Integrated (10/06/2022)   Social Connection and Isolation Panel [NHANES]    Frequency of Communication with Friends and Family: More than three times a week    Frequency of Social Gatherings with Friends and Family: Three times a week    Attends Religious Services: More than 4 times per year    Active Member of Clubs or Organizations: No    Attends Banker Meetings: Never    Marital Status: Married  Catering manager Violence: Not At Risk (10/06/2022)    Humiliation, Afraid, Rape, and Kick questionnaire    Fear of Current or Ex-Partner: No    Emotionally Abused: No    Physically Abused: No    Sexually Abused: No     Review of Systems    General:  No chills, fever, night sweats or weight changes.  Cardiovascular:  No chest pain, dyspnea on exertion, edema, orthopnea, palpitations, paroxysmal nocturnal dyspnea. Dermatological: No rash, lesions/masses Respiratory: No cough, dyspnea Urologic: No hematuria, dysuria Abdominal:   No nausea, vomiting, diarrhea, bright red blood per rectum, melena, or hematemesis Neurologic:  No visual changes, wkns, changes in mental status. All other systems reviewed and are otherwise negative except as noted above.  Physical Exam    VS:  There were no vitals taken for this visit. , BMI There is no height or weight on file to calculate BMI. GEN: Well nourished, well developed, in no acute distress. HEENT: normal. Neck: Supple, no JVD, carotid bruits, or masses. Cardiac: RRR, no murmurs, rubs, or gallops. No clubbing, cyanosis, edema.  Radials/DP/PT 2+ and equal bilaterally.  Respiratory:  Respirations regular and unlabored, clear to auscultation bilaterally. GI: Soft, nontender, nondistended, BS + x 4. MS: no deformity or atrophy. Skin: warm and dry, no rash. Neuro:  Strength and sensation are intact. Psych: Normal affect.  Accessory Clinical Findings    Recent Labs: 09/15/2022: Magnesium 1.8 01/26/2023: TSH 1.762 06/03/2023: ALT 236; BUN 16; Creatinine 0.93; Hemoglobin 11.6; Platelet Count 125; Potassium 3.9; Sodium  137   Recent Lipid Panel    Component Value Date/Time   CHOL 139 12/22/2022 0811   CHOL 128 09/12/2018 1118   TRIG 63.0 12/22/2022 0811   HDL 62.50 12/22/2022 0811   HDL 57 09/12/2018 1118   CHOLHDL 2 12/22/2022 0811   VLDL 12.6 12/22/2022 0811   LDLCALC 64 12/22/2022 0811   LDLCALC 57 09/12/2018 1118   LDLDIRECT 144.8 02/09/2012 0928    No BP recorded.  {Refresh Note OR Click  here to enter BP  :1}***    ECG personally reviewed by me today-none today.    Echocardiogram 09/08/2022 IMPRESSIONS     1. Left ventricular ejection fraction, by estimation, is 65 to 70%. The  left ventricle has normal function. The left ventricle demonstrates  regional wall motion abnormalities (see scoring diagram/findings for  description). There is moderate concentric  left ventricular hypertrophy. Left ventricular diastolic parameters are  indeterminate.   2. Right ventricular systolic function is normal. The right ventricular  size is moderately enlarged. There is normal pulmonary artery systolic  pressure.   3. Right atrial size was mildly dilated.   4. The mitral valve is degenerative. No evidence of mitral valve  regurgitation. No evidence of mitral stenosis. The mean mitral valve  gradient is 3.0 mmHg with average heart rate of 66 bpm. Severe mitral  annular calcification.   5. Mixed valve disease. Central aortic regurgitation, normal LV stroke  volume index. The aortic valve is calcified. Aortic valve regurgitation is  mild to moderate. Mild aortic valve stenosis. Aortic valve mean gradient  measures 14.0 mmHg.   6. The inferior vena cava is normal in size with greater than 50%  respiratory variability, suggesting right atrial pressure of 3 mmHg.   Comparison(s): Prior images reviewed side by side. Similar aortic  findings. LV wall motion comparison is difficult- WMA seen best on  contrast images, prior contrast images not performed.   FINDINGS   Left Ventricle: Left ventricular ejection fraction, by estimation, is 65  to 70%. The left ventricle has normal function. The left ventricle  demonstrates regional wall motion abnormalities. The left ventricular  internal cavity size was normal in size.  There is moderate concentric left ventricular hypertrophy. Left  ventricular diastolic parameters are indeterminate.     LV Wall Scoring:  The mid and distal anterior  septum and mid inferoseptal segment are  hypokinetic.   Right Ventricle: The right ventricular size is moderately enlarged. No  increase in right ventricular wall thickness. Right ventricular systolic  function is normal. There is normal pulmonary artery systolic pressure.  The tricuspid regurgitant velocity is  2.00 m/s, and with an assumed right atrial pressure of 3 mmHg, the  estimated right ventricular systolic pressure is 19.0 mmHg.   Left Atrium: Left atrial size was normal in size.   Right Atrium: Right atrial size was mildly dilated.   Pericardium: There is no evidence of pericardial effusion.   Mitral Valve: The mitral valve is degenerative in appearance. Severe  mitral annular calcification. No evidence of mitral valve regurgitation.  No evidence of mitral valve stenosis. MV peak gradient, 9.5 mmHg. The mean  mitral valve gradient is 3.0 mmHg  with average heart rate of 66 bpm.   Tricuspid Valve: The tricuspid valve is normal in structure. Tricuspid  valve regurgitation is trivial. No evidence of tricuspid stenosis.   Aortic Valve: Mixed valve disease. Central aortic regurgitation, normal LV  stroke volume index. The aortic valve is calcified. Aortic valve  regurgitation is mild to moderate. Aortic regurgitation PHT measures 651  msec. Mild aortic stenosis is present.  Aortic valve mean gradient measures 14.0 mmHg. Aortic valve peak gradient  measures 21.9 mmHg. Aortic valve area, by VTI measures 2.18 cm.   Pulmonic Valve: The pulmonic valve was not well visualized. Pulmonic valve  regurgitation is not visualized.   Aorta: The aortic root and ascending aorta are structurally normal, with  no evidence of dilitation.   Venous: The inferior vena cava is normal in size with greater than 50%  respiratory variability, suggesting right atrial pressure of 3 mmHg.   IAS/Shunts: No atrial level shunt detected by color flow Doppler.    Cardiac PET/CT 12/15/2022  The study  is grossly normal. The study is low risk.  LV perfusion is normal.  Rest left ventricular function is normal. Rest EF: 55 %. Stress left ventricular function is normal. Stress EF: 60 %. End diastolic cavity size is normal. End systolic cavity size is normal.  Myocardial blood flow was computed to be 0.31ml/g/min at rest and 1.93ml/g/min at stress. Global myocardial blood flow reserve was 1.86 and was mildly abnormal. This finding is less sensitive in the setting of prior coronary intervention.  Coronary calcium assessment not performed due to prior revascularization. Aortic atherosclerosis and aortic valve calcium noted.  Electronically Signed By: Riley Lam M.D.  CLINICAL DATA: This over-read does not include interpretation of cardiac or coronary anatomy or pathology. The Cardiac PET CT interpretation by the cardiologist is attached.  COMPARISON: 11/02/2022  FINDINGS: Vascular: No acute abnormality.  Mediastinum/Nodes: Previous index subcarinal lymph node measures 1.2 cm, image 13/3. Formally 1.4 cm.  Lungs/Pleura: No pleural effusion, airspace consolidation or pneumothorax.  BILATERAL PULMONARY NODULES ARE AGAIN SEEN COMPATIBLE WITH KNOWN METASTATIC DISEASE.: BILATERAL PULMONARY NODULES ARE AGAIN SEEN COMPATIBLE WITH KNOWN METASTATIC DISEASE. For example:  -Index nodule within the right middle lobe measures 1.3 cm, image 38/4. Stable from previous exam.  -Posterior left upper lobe lung nodule measures 1.1 cm, image 6/4. Previously 1.3 cm.  Upper Abdomen: No acute abnormality.  Musculoskeletal: No acute or suspicious osseous findings.  IMPRESSION: 1. No acute findings identified within the chest. 2. Bilateral pulmonary nodules are again seen compatible with known metastatic disease. These are not significantly changed in the interval. 3. Mild decrease in size of subcarinal adenopathy.   Electronically Signed By: Signa Kell M.D. On: 12/15/2022 12:37       Assessment & Plan   1.  Bilateral lower extremity edema-euvolemic.  Weight today***.  Previously ordered furosemide x 3 days with supplemental potassium. Heart healthy low-sodium diet-reviewed Elevate legs when not active Continue lower extremity support stockings Daily weights and with log-continue Lower extremity support stockings-continue  Hyperlipidemia-LDL***64 on 12/22/22. High-fiber diet Continue rosuvastatin Maintain physical activity as tolerated  Coronary artery disease-denies recent anginal symptoms at rest and with exertion.  Reassuring cardiac PET/CT 6/24.  Details above. Continue to monitor Continue metoprolol, rosuvastatin, Brilinta  Aortic valve sclerosis-has returned to baseline.  Echocardiogram 09/08/2022 showed normal LV function moderate LVH, intermediate diastolic parameters mildly dilated right atria and mild aortic valve stenosis with a mean gradient of 14 mmHg. Plan for repeat echocardiogram 3/25  Disposition: Follow-up with Dr. Kirke Corin or me in 4 to 6 months.  Thomasene Ripple. Muaz Shorey NP-C     06/07/2023, 7:28 AM [facility] [address] Office [phone] Fax [phone]    I spent 13*** minutes examining this patient, reviewing medications, and using patient centered shared  decision making involving her cardiac care.   I spent greater than 20 minutes reviewing her past medical history,  medications, and prior cardiac tests.

## 2023-06-07 NOTE — Telephone Encounter (Addendum)
"   I want to see a hepatologist". He has seen Dr Ellin Saba @ [facility]. Message sent to Dr. Arbutus Ped.

## 2023-06-08 ENCOUNTER — Other Ambulatory Visit: Payer: Self-pay | Admitting: Medical Oncology

## 2023-06-08 ENCOUNTER — Telehealth: Payer: Self-pay | Admitting: Medical Oncology

## 2023-06-08 DIAGNOSIS — C3490 Malignant neoplasm of unspecified part of unspecified bronchus or lung: Secondary | ICD-10-CM

## 2023-06-08 NOTE — Telephone Encounter (Signed)
LVM that Dr Arbutus Ped  ordered repeat labs this week and a CT abd scan because of your elevated LFT's. Schedule message sent and I gave him the number to call to schedule the scan.

## 2023-06-09 ENCOUNTER — Encounter: Payer: Self-pay | Admitting: General Practice

## 2023-06-09 ENCOUNTER — Ambulatory Visit: Payer: Medicare Other | Attending: General Practice | Admitting: General Practice

## 2023-06-09 VITALS — BP 102/58 | HR 60 | Ht 68.0 in | Wt 193.8 lb

## 2023-06-09 DIAGNOSIS — I2510 Atherosclerotic heart disease of native coronary artery without angina pectoris: Secondary | ICD-10-CM | POA: Diagnosis not present

## 2023-06-09 DIAGNOSIS — I350 Nonrheumatic aortic (valve) stenosis: Secondary | ICD-10-CM | POA: Diagnosis not present

## 2023-06-09 DIAGNOSIS — E785 Hyperlipidemia, unspecified: Secondary | ICD-10-CM

## 2023-06-09 DIAGNOSIS — R600 Localized edema: Secondary | ICD-10-CM | POA: Diagnosis not present

## 2023-06-09 MED ORDER — FUROSEMIDE 20 MG PO TABS: 20.0000 mg | ORAL_TABLET | ORAL | 3 refills | Status: DC | PRN

## 2023-06-09 MED ORDER — POTASSIUM CHLORIDE ER 10 MEQ PO TBCR: 10.0000 meq | EXTENDED_RELEASE_TABLET | ORAL | 3 refills | Status: AC | PRN

## 2023-06-09 NOTE — Patient Instructions (Addendum)
Medication Instructions:  Start Furosemide 20 mg prn as need. Please take if 2-3 lbs overnight or 8 lbs increase in week Please take Potassium 10 meq when taking Furosemide 20 mg  *If you need a refill on your cardiac medications before your next appointment, please call your pharmacy*   Lab Work: none If you have labs (blood work) drawn today and your tests are completely normal, you will receive your results only by: MyChart Message (if you have MyChart) OR A paper copy in the mail If you have any lab test that is abnormal or we need to change your treatment, we will call you to review the results.   Testing/Procedures: none   Follow-Up: At [facility], you and your health needs are our priority.  As part of our continuing mission to provide you with exceptional heart care, we have created designated Provider Care Teams.  These Care Teams include your primary Cardiologist (physician) and Advanced Practice Providers (APPs -  Physician Assistants and Nurse Practitioners) who all work together to provide you with the care you need, when you need it.  We recommend signing up for the patient portal called "MyChart".  Sign up information is provided on this After Visit Summary.  MyChart is used to connect with patients for Virtual Visits (Telemedicine).  Patients are able to view lab/test results, encounter notes, upcoming appointments, etc.  Non-urgent messages can be sent to your provider as well.   To learn more about what you can do with MyChart, go to [email or web address].    Your next appointment:   6 month(s)  Provider:   Edd Fabian, FNP        Other Instructions  [facility]:

## 2023-06-15 ENCOUNTER — Ambulatory Visit (HOSPITAL_COMMUNITY)
Admission: RE | Admit: 2023-06-15 | Discharge: 2023-06-15 | Disposition: A | Discharge status: Home / Self Care | Payer: Medicare Other | Source: Ambulatory Visit | Attending: Internal Medicine | Admitting: Internal Medicine

## 2023-06-15 ENCOUNTER — Inpatient Hospital Stay: Payer: Medicare Other

## 2023-06-15 DIAGNOSIS — C3490 Malignant neoplasm of unspecified part of unspecified bronchus or lung: Secondary | ICD-10-CM | POA: Insufficient documentation

## 2023-06-15 DIAGNOSIS — N3289 Other specified disorders of bladder: Secondary | ICD-10-CM | POA: Diagnosis not present

## 2023-06-15 DIAGNOSIS — R7989 Other specified abnormal findings of blood chemistry: Secondary | ICD-10-CM | POA: Diagnosis not present

## 2023-06-15 DIAGNOSIS — K7689 Other specified diseases of liver: Secondary | ICD-10-CM | POA: Diagnosis not present

## 2023-06-15 DIAGNOSIS — C61 Malignant neoplasm of prostate: Secondary | ICD-10-CM | POA: Diagnosis not present

## 2023-06-15 DIAGNOSIS — Z960 Presence of urogenital implants: Secondary | ICD-10-CM

## 2023-06-15 DIAGNOSIS — C7A1 Malignant poorly differentiated neuroendocrine tumors: Secondary | ICD-10-CM

## 2023-06-15 DIAGNOSIS — C7951 Secondary malignant neoplasm of bone: Secondary | ICD-10-CM | POA: Diagnosis not present

## 2023-06-15 LAB — CMP (CANCER CENTER ONLY)
ALT: 72 U/L — ABNORMAL HIGH (ref 0–44)
AST: 41 U/L (ref 15–41)
Albumin: 3.6 g/dL (ref 3.5–5.0)
Alkaline Phosphatase: 146 U/L — ABNORMAL HIGH (ref 38–126)
Anion gap: 3 — ABNORMAL LOW (ref 5–15)
BUN: 24 mg/dL — ABNORMAL HIGH (ref 8–23)
CO2: 26 mmol/L (ref 22–32)
Calcium: 8.6 mg/dL — ABNORMAL LOW (ref 8.9–10.3)
Chloride: 104 mmol/L (ref 98–111)
Creatinine: 0.98 mg/dL (ref 0.61–1.24)
GFR, Estimated: >60 mL/min (ref >60)
Glucose, Bld: 304 mg/dL — ABNORMAL HIGH (ref 70–99)
Potassium: 4.6 mmol/L (ref 3.5–5.1)
Sodium: 133 mmol/L — ABNORMAL LOW (ref 135–145)
Total Bilirubin: 1.3 mg/dL — ABNORMAL HIGH (ref <1.2)
Total Protein: 5.9 g/dL — ABNORMAL LOW (ref 6.5–8.1)

## 2023-06-15 LAB — CBC WITH DIFFERENTIAL (CANCER CENTER ONLY)
Abs Immature Granulocytes: 0.04 10*3/uL (ref 0.00–0.07)
Basophils Absolute: 0 10*3/uL (ref 0.0–0.1)
Basophils Relative: 0 %
Eosinophils Absolute: 0 10*3/uL (ref 0.0–0.5)
Eosinophils Relative: 0 %
HCT: 37.2 % — ABNORMAL LOW (ref 39.0–52.0)
Hemoglobin: 12.7 g/dL — ABNORMAL LOW (ref 13.0–17.0)
Immature Granulocytes: 1 %
Lymphocytes Relative: 6 %
Lymphs Abs: 0.5 10*3/uL — ABNORMAL LOW (ref 0.7–4.0)
MCH: 35.1 pg — ABNORMAL HIGH (ref 26.0–34.0)
MCHC: 34.1 g/dL (ref 30.0–36.0)
MCV: 102.8 fL — ABNORMAL HIGH (ref 80.0–100.0)
Monocytes Absolute: 0.1 10*3/uL (ref 0.1–1.0)
Monocytes Relative: 2 %
Neutro Abs: 7.3 10*3/uL (ref 1.7–7.7)
Neutrophils Relative %: 91 %
Platelet Count: 114 10*3/uL — ABNORMAL LOW (ref 150–400)
RBC: 3.62 MIL/uL — ABNORMAL LOW (ref 4.22–5.81)
RDW: 14.6 % (ref 11.5–15.5)
WBC Count: 7.9 10*3/uL (ref 4.0–10.5)
nRBC: 0 % (ref 0.0–0.2)

## 2023-06-15 MED ORDER — IOHEXOL 300 MG/ML  SOLN
100.0000 mL | Freq: Once | INTRAMUSCULAR | Status: AC | PRN
  Administered 2023-06-15: 100 mL via INTRAVENOUS

## 2023-06-15 MED ORDER — HEPARIN SOD (PORK) LOCK FLUSH 100 UNIT/ML IV SOLN
500.0000 [IU] | Freq: Once | INTRAVENOUS | Status: AC
  Administered 2023-06-15: 500 [IU] via INTRAVENOUS

## 2023-06-15 MED ORDER — SODIUM CHLORIDE 0.9% FLUSH
10.0000 mL | Freq: Once | INTRAVENOUS | Status: AC
  Administered 2023-06-15: 10 mL

## 2023-06-16 ENCOUNTER — Telehealth: Payer: Self-pay

## 2023-06-16 NOTE — Telephone Encounter (Signed)
Spoke with patient in regards to lab results.  Per Cassie, PA- Liver enzymes are improving. Patient stated he does not have diabetes, but his PCP office is monitoring for pre-diabetes. Labs faxed to PCP's office. Informed patient to watch his sugar intake closely and monitor for increased thirst and urination, blurry vision and fatigue. Patient stated that he has been urinating more frequently. Informed patient to follow up with PCP.

## 2023-06-17 ENCOUNTER — Other Ambulatory Visit: Payer: Self-pay

## 2023-06-17 ENCOUNTER — Encounter: Payer: Self-pay | Admitting: Internal Medicine

## 2023-06-17 DIAGNOSIS — C7A1 Malignant poorly differentiated neuroendocrine tumors: Secondary | ICD-10-CM

## 2023-06-17 NOTE — Telephone Encounter (Signed)
Spoke with patient in regards to recent CT abd/pelvis scan, future CT chest scan and appt in January.   Per Dr. Arbutus Ped-  Once the CT abd/pelvis scan is read, we will call patient with results and if cancer related, we will make appt for patient to be seen soon, if non-cancer related, refer to GI with Dr. Adela Lank. CT chest in January and follow up about a week later. Patient verbalized understanding.  Patient is calling PCP to get scheduled to follow up on glucose.

## 2023-06-18 ENCOUNTER — Telehealth: Payer: Self-pay | Admitting: Family Medicine

## 2023-06-18 NOTE — Telephone Encounter (Signed)
Copied from CRM [id number]. Topic: Clinical - Request for Lab/Test Order >> Jun 18, 2023 11:28 AM Lorin Glass B wrote: Reason for CRM: Patient would like to know if order for labwork was received from Dr [facility] oncology office. Stated that he is supposed to make an appt for specific bloodwork with Dr Sharen Hones per Dr Arbutus Ped. Callback [phone]  Have looked into lab orders, I do not see any from Dr. Gwenyth Bouillon or that don't specify to have them done at the cancer center.

## 2023-06-18 NOTE — Telephone Encounter (Signed)
Latest sugars are now in uncontrolled diabetes range  Rec schedule next available OV to review this.  In interim rec push water intake, limit added sugars, sweetened beverages, limit to small portions of simple carbohydrate .

## 2023-06-21 NOTE — Telephone Encounter (Signed)
Spoke with pt relaying Dr Timoteo Expose message Pt verbalizes understanding and scheduled OV on 06/29/23 at 11:00.

## 2023-06-24 NOTE — Progress Notes (Signed)
Per Dr. Arbutus Ped, Ct scan results sent to Dr. Mena Goes office to follow up with patient. Fax confirmed at [phone].

## 2023-06-29 ENCOUNTER — Ambulatory Visit: Payer: Medicare Other | Admitting: Family Medicine

## 2023-06-29 ENCOUNTER — Encounter: Payer: Self-pay | Admitting: Family Medicine

## 2023-06-29 VITALS — BP 136/78 | HR 65 | Temp 98.0°F | Ht 68.0 in | Wt 189.0 lb

## 2023-06-29 DIAGNOSIS — R8290 Unspecified abnormal findings in urine: Secondary | ICD-10-CM | POA: Diagnosis not present

## 2023-06-29 DIAGNOSIS — K769 Liver disease, unspecified: Secondary | ICD-10-CM

## 2023-06-29 DIAGNOSIS — E1169 Type 2 diabetes mellitus with other specified complication: Secondary | ICD-10-CM | POA: Diagnosis not present

## 2023-06-29 DIAGNOSIS — R319 Hematuria, unspecified: Secondary | ICD-10-CM

## 2023-06-29 DIAGNOSIS — R300 Dysuria: Secondary | ICD-10-CM

## 2023-06-29 DIAGNOSIS — N3941 Urge incontinence: Secondary | ICD-10-CM | POA: Diagnosis not present

## 2023-06-29 DIAGNOSIS — C7A1 Malignant poorly differentiated neuroendocrine tumors: Secondary | ICD-10-CM

## 2023-06-29 DIAGNOSIS — E119 Type 2 diabetes mellitus without complications: Secondary | ICD-10-CM | POA: Insufficient documentation

## 2023-06-29 DIAGNOSIS — C61 Malignant neoplasm of prostate: Secondary | ICD-10-CM

## 2023-06-29 LAB — POCT GLYCOSYLATED HEMOGLOBIN (HGB A1C): Hemoglobin A1C: 7.8 % — AB (ref 4.0–5.6)

## 2023-06-29 LAB — POC URINALSYSI DIPSTICK (AUTOMATED)
Bilirubin, UA: NEGATIVE
Glucose, UA: NEGATIVE
Ketones, UA: NEGATIVE
Leukocytes, UA: NEGATIVE
Nitrite, UA: NEGATIVE
Protein, UA: POSITIVE — AB
Spec Grav, UA: 1.015 (ref 1.010–1.025)
Urobilinogen, UA: 0.2 U/dL
pH, UA: 6 (ref 5.0–8.0)

## 2023-06-29 LAB — URINALYSIS, MICROSCOPIC ONLY: RBC / HPF: NONE SEEN (ref 0–?)

## 2023-06-29 MED ORDER — BLOOD GLUCOSE MONITORING SUPPL DEVI: 1.0000 | Freq: Three times a day (TID) | 0 refills | Status: AC

## 2023-06-29 MED ORDER — LANCET DEVICE MISC: 1.0000 | Freq: Three times a day (TID) | 0 refills | Status: AC

## 2023-06-29 MED ORDER — BLOOD GLUCOSE TEST VI STRP: 1.0000 | ORAL_STRIP | Freq: Three times a day (TID) | 0 refills | Status: AC

## 2023-06-29 MED ORDER — GLIPIZIDE 5 MG PO TABS: 5.0000 mg | ORAL_TABLET | Freq: Every day | ORAL | 3 refills | Status: DC

## 2023-06-29 MED ORDER — LANCETS MISC. MISC: 1.0000 | Freq: Three times a day (TID) | 0 refills | Status: AC

## 2023-06-29 NOTE — Patient Instructions (Addendum)
 Urinalysis today Diabetes handouts provided today  Ask for diabetic eye exam next time you see eye doctor.  Continue to stay well hydrated.  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  I've sent glucometer to your pharmacy.   Goal fasting sugar 80-120 Goal post-meal sugar (2 hours) <180 Too low is <70.   We will refer you to diabetes education classes in [facility]  May take glipizide  5mg  daily with breakfast for when you take prednisone .  Return in 6 week for diabetes follow up visit  Let me know sooner if you are again placed on higher prednisone  course.

## 2023-06-29 NOTE — Progress Notes (Signed)
 Ph: [phone] Fax: [phone]   Patient ID: Larry Kent, male    DOB: 09/23/1945, 77 y.o.   MRN: [id number]  This visit was conducted in person.  BP 136/78   Pulse 65   Temp 98 F (36.7 C) (Oral)   Ht 5' 8 (1.727 m)   Wt 189 lb (85.7 kg)   SpO2 98%   BMI 28.74 kg/m    CC: 6 mo f/u visit  Subjective:   HPI: Larry Kent is a 77 y.o. male presenting on 06/29/2023 for Medical Management of Chronic Issues (Here for prediabetes f/u. 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 Also with prostate cancer diagnosed 07/2022 followed by urology on Zytiga and Eligard and prednisone  5mg  twice daily. Regularly sees oncology, urology, on palliative immunotherapy through oncology (currently on hold until transaminitis improved). Most recent imaging CT this month showing left bladder wall thickening rec cystoscopy to r/o primary bladder cancer - pending appt. CT also noted cirrhotic changes to liver. No alcohol  history. He notes worsening urinary urge incontinence with accidents over the past 2-3 months - now regularly wearing depends. No dysuria, hematuria, abd pain or flank pain, nausea/vomiting or fevers/chills.   CAD - inferior STEMI 08/2022 s/p hospitalization with catheterization and RCA stent placement DES x4. He completed cardiac rehab. Regularly uses compression stockings.   Recently saw cardiology for pedal edema after increased salt intake but also in setting of liver disease, treated with lasix  with benefit.  Has noted pale loose stools, no increased buoyancy.   Recently found to have deteriorated glycemic control with sugars trending into uncontrolled diabetes range. This was in setting of starting 80mg  prednisone  taper done for transaminitis, started 06/03/2023, currently back on chronic daily dose of prednisone  10mg .   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 Does not  regularly check sugars. Denies low sugars or hypoglycemic symptoms. Denies paresthesias, blurry vision. Hsa not had diabetic eye exam yet. Glucometer brand: doesn't have one. Last foot exam: today. DSME: offered - referred. Lab Results  Component Value Date   HGBA1C 7.8 (A) 06/29/2023   Diabetic Foot Exam - Simple   Simple Foot Form Diabetic Foot exam was performed with the following findings: Yes 06/29/2023 11:34 AM  Visual Inspection No deformities, no ulcerations, no other skin breakdown bilaterally: Yes Sensation Testing Intact to touch and monofilament testing bilaterally: Yes Pulse Check Posterior Tibialis and Dorsalis pulse intact bilaterally: Yes Comments No claudication    No results found for: MACKEY CURRENT      Relevant past medical, surgical, family and social history reviewed and updated as indicated. Interim medical history since our last visit reviewed. Allergies and medications reviewed and updated. Outpatient Medications Prior to Visit  Medication Sig Dispense Refill   abiraterone acetate (ZYTIGA) 250 MG tablet Take 1,000 mg by mouth daily.     acetaminophen  (TYLENOL ) 500 MG tablet Take 1,000 mg by mouth every 6 (six) hours as needed for mild pain or headache.     Calcium  Carb-Cholecalciferol (RA CALCIUM  PLUS VITAMIN D ) 600-10 MG-MCG TABS Take 600 mg by mouth daily.     fluticasone  (FLONASE ) 50 MCG/ACT nasal spray Place 1 spray into both nostrils daily as needed for allergies or rhinitis.     furosemide  (LASIX ) 20 MG tablet Take 1 tablet (20 mg total) by mouth as needed (take if increase in weight  of 2-3 lbs overnight or 8 lbs in a week).  90 tablet 3   lidocaine -prilocaine  (EMLA ) cream Apply to the Port-A-Cath site 30-60 minutes before chemotherapy treatment (Patient taking differently: Apply 1 Application topically See admin instructions. Apply to the Port-A-Cath site 30-60 minutes before chemotherapy treatment) 30 g 0   metoprolol  tartrate (LOPRESSOR ) 25  MG tablet Take 0.5 tablets (12.5 mg total) by mouth 2 (two) times daily. 30 tablet 11   Multiple Vitamin (MULTIVITAMIN) tablet Take 1 tablet by mouth daily with breakfast.     omeprazole  (PRILOSEC) 20 MG capsule TAKE 1 CAPSULE BY MOUTH EVERY DAY 90 capsule 1   potassium chloride  (KLOR-CON ) 10 MEQ tablet Take 1 tablet (10 mEq total) by mouth as needed. 90 tablet 3   predniSONE  (DELTASONE ) 20 MG tablet Please take 4 tablets (80 mg) daily for 4 days, then take 3 tablets daily (60 mg) for 4 days, followed by 2 tablets daily (40 mg) daily for 4 days, followed by 1 tablet daily (20 mg) daily for 4 days, followed by 1/2 tablet for 4 days, then stop 43 tablet 0   predniSONE  (DELTASONE ) 5 MG tablet Take 5 mg by mouth 2 (two) times daily.     prochlorperazine  (COMPAZINE ) 10 MG tablet Take 1 tablet (10 mg total) by mouth every 6 (six) hours as needed for nausea or vomiting. 30 tablet 0   rosuvastatin  (CRESTOR ) 20 MG tablet Take 1 tablet (20 mg total) by mouth daily. 90 tablet 4   tamsulosin  (FLOMAX ) 0.4 MG CAPS capsule Take 0.4 mg by mouth 2 (two) times daily.     ticagrelor  (BRILINTA ) 90 MG TABS tablet Take 1 tablet (90 mg total) by mouth 2 (two) times daily. 60 tablet 11   Zinc 25 MG TABS Take 25 mg by mouth daily.     cephALEXin  (KEFLEX ) 500 MG capsule Take 1 capsule (500 mg total) by mouth 3 (three) times daily. 21 capsule 0   nitrofurantoin  (MACRODANTIN ) 50 MG capsule Take 1 capsule (50 mg total) by mouth 2 (two) times daily. 10 capsule 0   valACYclovir  (VALTREX ) 1000 MG tablet Take 1 tablet (1,000 mg total) by mouth 3 (three) times daily. 21 tablet 0   No facility-administered medications prior to visit.     Per HPI unless specifically indicated in ROS section below Review of Systems  Objective:  BP 136/78   Pulse 65   Temp 98 F (36.7 C) (Oral)   Ht 5' 8 (1.727 m)   Wt 189 lb (85.7 kg)   SpO2 98%   BMI 28.74 kg/m   Wt Readings from Last 3 Encounters:  06/29/23 189 lb (85.7 kg)  06/09/23  193 lb 12.8 oz (87.9 kg)  05/20/23 196 lb (88.9 kg)      Physical Exam Vitals and nursing note reviewed.  Constitutional:      Appearance: Normal appearance. He is not ill-appearing.  Eyes:     Extraocular Movements: Extraocular movements intact.     Conjunctiva/sclera: Conjunctivae normal.     Pupils: Pupils are equal, round, and reactive to light.  Cardiovascular:     Rate and Rhythm: Normal rate and regular rhythm.     Pulses: Normal pulses.     Heart sounds: Murmur (3/6 systolic at USB) heard.  Pulmonary:     Effort: Pulmonary effort is normal. No respiratory distress.     Breath sounds: Normal breath sounds. No wheezing, rhonchi or rales.  Musculoskeletal:     Right lower leg: No edema.     Left lower leg: No edema.  Comments: See HPI for foot exam if done  Skin:    General: Skin is warm and dry.     Findings: No rash.  Neurological:     Mental Status: He is alert.  Psychiatric:        Mood and Affect: Mood normal.        Behavior: Behavior normal.       Results for orders placed or performed in visit on 06/29/23  POCT glycosylated hemoglobin (Hb A1C)   Collection Time: 06/29/23 11:30 AM  Result Value Ref Range   Hemoglobin A1C 7.8 (A) 4.0 - 5.6 %   HbA1c POC (<> result, manual entry)     HbA1c, POC (prediabetic range)     HbA1c, POC (controlled diabetic range)    POCT Urinalysis Dipstick (Automated)   Collection Time: 06/29/23 12:56 PM  Result Value Ref Range   Color, UA yellow    Clarity, UA cloudy    Glucose, UA Negative Negative   Bilirubin, UA negative    Ketones, UA negative    Spec Grav, UA 1.015 1.010 - 1.025   Blood, UA 1+    pH, UA 6.0 5.0 - 8.0   Protein, UA Positive (A) Negative   Urobilinogen, UA 0.2 0.2 or 1.0 E.U./dL   Nitrite, UA negative    Leukocytes, UA Negative Negative  Urinalysis, microscopic only   Collection Time: 06/29/23  1:15 PM  Result Value Ref Range   WBC, UA 0-2/hpf 0-2/hpf   RBC / HPF none seen 0-2/hpf   Amorphous  Present (A) None;Present  Abnormal microscopy in office - sent to lab for formal microscopy.   Assessment & Plan:   Problem List Items Addressed This Visit     Hematuria (Chronic)   Regularly seeing urology.       Relevant Orders   Urinalysis, microscopic only (Completed)   Large cell neuroendocrine carcinoma [facility])   Appreciate urology, oncology care.  Immunotherapy currently on hold until transaminitis improves.      Prostate cancer metastatic to multiple sites [facility])   Appreciate urology, oncology care.  Continues Zytiga, eligard, prednisone .       Liver disease   With cirrhotic changes on imaging as well as transaminitis of unclear cause.  Recently completed prednisone  taper through oncology.  No h/o alcohol  use.       Relevant Orders   Urinalysis, microscopic only (Completed)   Type 2 diabetes mellitus with other specified complication (HCC) - Primary   New diabetes diagnosis after higher prednisone  taper (80mg ) He is coming of prednisone  taper and planning to just continue prednisone  5mg  BID use with his Zytiga.  Reviewed pathophysiology of diabetes, in his case triggered by steroid use, as well as treatment options and need for tighter blood pressure, cholesterol control.  Discussed anticipate sugars will continue to improve over time as prednisone  dose drops.  Recommend he ask for diabetic eye exam next eye doctor appointment.  Will Rx glipizide  5mg  daily with breakfast PRN higher sugars, steroid use.  Will Rx glucometer to have at home, reviewing recommended goals of fasting 80-120, postprandial <180. Will refer to diabetes education classes. RTC 6 wks close DM f/u.       Relevant Medications   glipiZIDE  (GLUCOTROL ) 5 MG tablet   Other Relevant Orders   POCT glycosylated hemoglobin (Hb A1C) (Completed)   Ambulatory referral to diabetic education   Urge urinary incontinence   Worsening symptoms over the past few months. Check UA to r/o infection contributing -  dipstick positive for blood , will send microscopy.  Pending urology eval for left-sided thickened bladder wall.       Relevant Orders   POCT Urinalysis Dipstick (Automated) (Completed)   Urinalysis, microscopic only (Completed)   Other Visit Diagnoses       Abnormal urinalysis            Meds ordered this encounter  Medications   glipiZIDE  (GLUCOTROL ) 5 MG tablet    Sig: Take 1 tablet (5 mg total) by mouth daily with breakfast. As needed for high sugars with prednisone  use    Dispense:  30 tablet    Refill:  3   Blood Glucose Monitoring Suppl DEVI    Sig: 1 each by Does not apply route in the morning, at noon, and at bedtime. E11.69. May substitute to any manufacturer covered by patient's insurance.    Dispense:  1 each    Refill:  0   Glucose Blood (BLOOD GLUCOSE TEST STRIPS) STRP    Sig: 1 each by In Vitro route in the morning, at noon, and at bedtime. May substitute to any manufacturer covered by patient's insurance.    Dispense:  100 strip    Refill:  0   Lancet Device MISC    Sig: 1 each by Does not apply route in the morning, at noon, and at bedtime. May substitute to any manufacturer covered by patient's insurance.    Dispense:  1 each    Refill:  0   Lancets Misc. MISC    Sig: 1 each by Does not apply route in the morning, at noon, and at bedtime. May substitute to any manufacturer covered by patient's insurance.    Dispense:  100 each    Refill:  0    Orders Placed This Encounter  Procedures   Urinalysis, microscopic only   Ambulatory referral to diabetic education    Referral Priority:   Routine    Referral Type:   Consultation    Referral Reason:   Specialty Services Required    Number of Visits Requested:   1   POCT glycosylated hemoglobin (Hb A1C)   POCT Urinalysis Dipstick (Automated)    Patient Instructions  Urinalysis today Diabetes handouts provided today  Ask for diabetic eye exam next time you see eye doctor.  Continue to stay well hydrated.   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  I've sent glucometer to your pharmacy.   Goal fasting sugar 80-120 Goal post-meal sugar (2 hours) <180 Too low is <70.   We will refer you to diabetes education classes in [facility]  May take glipizide  5mg  daily with breakfast for when you take prednisone .  Return in 6 week for diabetes follow up visit  Let me know sooner if you are again placed on higher prednisone  course.   Follow up plan: Return in about 6 weeks (around 08/10/2023), or if symptoms worsen or fail to improve, for follow up visit.  Anton Blas, MD

## 2023-07-01 DIAGNOSIS — N3941 Urge incontinence: Secondary | ICD-10-CM | POA: Insufficient documentation

## 2023-07-01 NOTE — Assessment & Plan Note (Signed)
 Worsening symptoms over the past few months. Check UA to r/o infection contributing - dipstick positive for blood , will send microscopy.  Pending urology eval for left-sided thickened bladder wall.

## 2023-07-01 NOTE — Assessment & Plan Note (Signed)
 Appreciate urology, oncology care.  Continues Zytiga, eligard, prednisone.

## 2023-07-01 NOTE — Assessment & Plan Note (Signed)
 Appreciate urology, oncology care.  Immunotherapy currently on hold until transaminitis improves.

## 2023-07-01 NOTE — Assessment & Plan Note (Addendum)
 New diabetes diagnosis after higher prednisone  taper (80mg ) He is coming of prednisone  taper and planning to just continue prednisone  5mg  BID use with his Zytiga.  Reviewed pathophysiology of diabetes, in his case triggered by steroid use, as well as treatment options and need for tighter blood pressure, cholesterol control.  Discussed anticipate sugars will continue to improve over time as prednisone  dose drops.  Recommend he ask for diabetic eye exam next eye doctor appointment.  Will Rx glipizide  5mg  daily with breakfast PRN higher sugars, steroid use.  Will Rx glucometer to have at home, reviewing recommended goals of fasting 80-120, postprandial <180. Will refer to diabetes education classes. RTC 6 wks close DM f/u.

## 2023-07-01 NOTE — Assessment & Plan Note (Addendum)
 With cirrhotic changes on imaging as well as transaminitis of unclear cause.  Recently completed prednisone taper through oncology.  No h/o alcohol use.

## 2023-07-01 NOTE — Assessment & Plan Note (Signed)
Regularly seeing urology

## 2023-07-23 ENCOUNTER — Telehealth: Payer: Self-pay | Admitting: Physician Assistant

## 2023-07-23 ENCOUNTER — Telehealth: Payer: Self-pay | Admitting: Medical Oncology

## 2023-07-23 ENCOUNTER — Ambulatory Visit (HOSPITAL_COMMUNITY)
Admission: RE | Admit: 2023-07-23 | Discharge: 2023-07-23 | Disposition: A | Discharge status: Home / Self Care | Payer: Medicare Other | Source: Ambulatory Visit | Attending: Physician Assistant | Admitting: Physician Assistant

## 2023-07-23 ENCOUNTER — Other Ambulatory Visit: Payer: Medicare Other

## 2023-07-23 ENCOUNTER — Inpatient Hospital Stay: Payer: Medicare Other | Attending: Nurse Practitioner

## 2023-07-23 DIAGNOSIS — C61 Malignant neoplasm of prostate: Secondary | ICD-10-CM | POA: Insufficient documentation

## 2023-07-23 DIAGNOSIS — C7A1 Malignant poorly differentiated neuroendocrine tumors: Secondary | ICD-10-CM | POA: Insufficient documentation

## 2023-07-23 DIAGNOSIS — R918 Other nonspecific abnormal finding of lung field: Secondary | ICD-10-CM | POA: Diagnosis not present

## 2023-07-23 DIAGNOSIS — T380X5A Adverse effect of glucocorticoids and synthetic analogues, initial encounter: Secondary | ICD-10-CM | POA: Diagnosis not present

## 2023-07-23 DIAGNOSIS — R7989 Other specified abnormal findings of blood chemistry: Secondary | ICD-10-CM

## 2023-07-23 DIAGNOSIS — C7B8 Other secondary neuroendocrine tumors: Secondary | ICD-10-CM | POA: Insufficient documentation

## 2023-07-23 DIAGNOSIS — Z7952 Long term (current) use of systemic steroids: Secondary | ICD-10-CM | POA: Diagnosis not present

## 2023-07-23 DIAGNOSIS — R739 Hyperglycemia, unspecified: Secondary | ICD-10-CM | POA: Diagnosis not present

## 2023-07-23 DIAGNOSIS — R590 Localized enlarged lymph nodes: Secondary | ICD-10-CM | POA: Diagnosis not present

## 2023-07-23 LAB — CBC WITH DIFFERENTIAL (CANCER CENTER ONLY)
Abs Immature Granulocytes: 0.01 10*3/uL (ref 0.00–0.07)
Basophils Absolute: 0.1 10*3/uL (ref 0.0–0.1)
Basophils Relative: 1 %
Eosinophils Absolute: 0.2 10*3/uL (ref 0.0–0.5)
Eosinophils Relative: 3 %
HCT: 40.4 % (ref 39.0–52.0)
Hemoglobin: 13.3 g/dL (ref 13.0–17.0)
Immature Granulocytes: 0 %
Lymphocytes Relative: 26 %
Lymphs Abs: 1.5 10*3/uL (ref 0.7–4.0)
MCH: 34 pg (ref 26.0–34.0)
MCHC: 32.9 g/dL (ref 30.0–36.0)
MCV: 103.3 fL — ABNORMAL HIGH (ref 80.0–100.0)
Monocytes Absolute: 0.9 10*3/uL (ref 0.1–1.0)
Monocytes Relative: 16 %
Neutro Abs: 3.1 10*3/uL (ref 1.7–7.7)
Neutrophils Relative %: 54 %
Platelet Count: 129 10*3/uL — ABNORMAL LOW (ref 150–400)
RBC: 3.91 MIL/uL — ABNORMAL LOW (ref 4.22–5.81)
RDW: 13.6 % (ref 11.5–15.5)
WBC Count: 5.7 10*3/uL (ref 4.0–10.5)
nRBC: 0 % (ref 0.0–0.2)

## 2023-07-23 LAB — CMP (CANCER CENTER ONLY)
ALT: 108 U/L — ABNORMAL HIGH (ref 0–44)
AST: 132 U/L — ABNORMAL HIGH (ref 15–41)
Albumin: 3.5 g/dL (ref 3.5–5.0)
Alkaline Phosphatase: 137 U/L — ABNORMAL HIGH (ref 38–126)
Anion gap: 7 (ref 5–15)
BUN: 14 mg/dL (ref 8–23)
CO2: 26 mmol/L (ref 22–32)
Calcium: 9.4 mg/dL (ref 8.9–10.3)
Chloride: 109 mmol/L (ref 98–111)
Creatinine: 1.01 mg/dL (ref 0.61–1.24)
GFR, Estimated: >60 mL/min (ref >60)
Glucose, Bld: 108 mg/dL — ABNORMAL HIGH (ref 70–99)
Potassium: 3.5 mmol/L (ref 3.5–5.1)
Sodium: 142 mmol/L (ref 135–145)
Total Bilirubin: 1.1 mg/dL (ref 0.0–1.2)
Total Protein: 6.3 g/dL — ABNORMAL LOW (ref 6.5–8.1)

## 2023-07-23 MED ORDER — IOHEXOL 300 MG/ML  SOLN
75.0000 mL | Freq: Once | INTRAMUSCULAR | Status: AC | PRN
  Administered 2023-07-23: 75 mL via INTRAVENOUS

## 2023-07-23 MED ORDER — METHYLPREDNISOLONE 4 MG PO TBPK: ORAL_TABLET | ORAL | 0 refills | Status: DC

## 2023-07-23 NOTE — Telephone Encounter (Signed)
LVM to take prednisone tablets all at the same time each day  in morning and with food and water.

## 2023-07-23 NOTE — Telephone Encounter (Signed)
I called the patient and left a voicemail. I was calling regarding his liver enzymes today. I was calling to see if and when he completed the prednisone taper prescribed on 06/03/23. His LFTs are trending upwards again based on his labs. I was calling to let him know this and that I would likely send prednisone back to the pharmacy to take. I left our call back number so we can relay this information to the patient. He is scheduled to see Dr. Arbutus Ped next week to review his scan. I will also arrange for repeat CMP next week.

## 2023-07-29 ENCOUNTER — Inpatient Hospital Stay: Payer: Medicare Other | Admitting: Internal Medicine

## 2023-07-29 ENCOUNTER — Inpatient Hospital Stay: Payer: Medicare Other

## 2023-07-29 VITALS — BP 129/75 | HR 51 | Temp 97.8°F | Resp 16 | Ht 68.0 in | Wt 194.4 lb

## 2023-07-29 DIAGNOSIS — C61 Malignant neoplasm of prostate: Secondary | ICD-10-CM | POA: Diagnosis not present

## 2023-07-29 DIAGNOSIS — C3490 Malignant neoplasm of unspecified part of unspecified bronchus or lung: Secondary | ICD-10-CM | POA: Diagnosis not present

## 2023-07-29 DIAGNOSIS — Z7952 Long term (current) use of systemic steroids: Secondary | ICD-10-CM | POA: Diagnosis not present

## 2023-07-29 DIAGNOSIS — C7A1 Malignant poorly differentiated neuroendocrine tumors: Secondary | ICD-10-CM | POA: Diagnosis not present

## 2023-07-29 DIAGNOSIS — T380X5A Adverse effect of glucocorticoids and synthetic analogues, initial encounter: Secondary | ICD-10-CM | POA: Diagnosis not present

## 2023-07-29 DIAGNOSIS — R739 Hyperglycemia, unspecified: Secondary | ICD-10-CM | POA: Diagnosis not present

## 2023-07-29 DIAGNOSIS — C7B8 Other secondary neuroendocrine tumors: Secondary | ICD-10-CM | POA: Diagnosis not present

## 2023-07-29 LAB — CBC WITH DIFFERENTIAL (CANCER CENTER ONLY)
Abs Immature Granulocytes: 0.07 10*3/uL (ref 0.00–0.07)
Basophils Absolute: 0 10*3/uL (ref 0.0–0.1)
Basophils Relative: 0 %
Eosinophils Absolute: 0.1 10*3/uL (ref 0.0–0.5)
Eosinophils Relative: 1 %
HCT: 37.6 % — ABNORMAL LOW (ref 39.0–52.0)
Hemoglobin: 12.8 g/dL — ABNORMAL LOW (ref 13.0–17.0)
Immature Granulocytes: 1 %
Lymphocytes Relative: 16 %
Lymphs Abs: 1.4 10*3/uL (ref 0.7–4.0)
MCH: 35 pg — ABNORMAL HIGH (ref 26.0–34.0)
MCHC: 34 g/dL (ref 30.0–36.0)
MCV: 102.7 fL — ABNORMAL HIGH (ref 80.0–100.0)
Monocytes Absolute: 1 10*3/uL (ref 0.1–1.0)
Monocytes Relative: 12 %
Neutro Abs: 6 10*3/uL (ref 1.7–7.7)
Neutrophils Relative %: 70 %
Platelet Count: 142 10*3/uL — ABNORMAL LOW (ref 150–400)
RBC: 3.66 MIL/uL — ABNORMAL LOW (ref 4.22–5.81)
RDW: 13.6 % (ref 11.5–15.5)
WBC Count: 8.6 10*3/uL (ref 4.0–10.5)
nRBC: 0.2 % (ref 0.0–0.2)

## 2023-07-29 LAB — CMP (CANCER CENTER ONLY)
ALT: 51 U/L — ABNORMAL HIGH (ref 0–44)
AST: 34 U/L (ref 15–41)
Albumin: 3.7 g/dL (ref 3.5–5.0)
Alkaline Phosphatase: 196 U/L — ABNORMAL HIGH (ref 38–126)
Anion gap: 4 — ABNORMAL LOW (ref 5–15)
BUN: 21 mg/dL (ref 8–23)
CO2: 28 mmol/L (ref 22–32)
Calcium: 9.2 mg/dL (ref 8.9–10.3)
Chloride: 106 mmol/L (ref 98–111)
Creatinine: 0.91 mg/dL (ref 0.61–1.24)
GFR, Estimated: >60 mL/min (ref >60)
Glucose, Bld: 138 mg/dL — ABNORMAL HIGH (ref 70–99)
Potassium: 4.6 mmol/L (ref 3.5–5.1)
Sodium: 138 mmol/L (ref 135–145)
Total Bilirubin: 1.1 mg/dL (ref 0.0–1.2)
Total Protein: 6.4 g/dL — ABNORMAL LOW (ref 6.5–8.1)

## 2023-07-29 NOTE — Progress Notes (Signed)
[facility] [phone]   [phone]  OFFICE PROGRESS NOTE  Larry Boyden, MD [address] [place] [address]  DIAGNOSIS:  1) Metastatic poorly differentiated carcinoma, neuroendocrine carcinoma of suspicious prostate primary versus primary lung cancer presented with innumerable and bilateral pulmonary nodules in addition to right hilar and mediastinal lymphadenopathy and metastatic disease to several areas of the bone in addition to hypermetabolic activity in the prostate gland diagnosed in January 2024  2) prostate adenocarcinoma diagnosed in February 2024 currently managed by Dr. Mena Goes   PRIOR THERAPY: None   CURRENT THERAPY:  Palliative systemic chemotherapy with carboplatin for AUC of 5 and Imfinzi 1500 Mg IV on day 1 as well as etoposide 100 Mg/M2 on days 1, 2 and 3 with Neulasta support on day 5. Status post 7 cycles. First dose on 08/11/22.  Starting from cycle #2 his carboplatin will be reduced to AUC of 4 and 2 etoposide 80 Mg/M2 secondary to intolerance and cycle #1.  Starting from cycle #5 he will be on maintenance treatment with single agent Imfinzi 1500 Mg IV every 4 weeks.  INTERVAL HISTORY: Larry Kent 77 y.o. male returns to the clinic today for follow-up visit.Discussed the use of AI scribe software for clinical note transcription with the patient, who gave verbal consent to proceed.  History of Present Illness   The patient is a 78 year old male with metastatic poorly differentiated neuroendocrine carcinoma and prostate adenocarcinoma who presents for follow-up.  He was diagnosed with metastatic poorly differentiated neuroendocrine carcinoma in January 2024 and underwent chemotherapy with carboplatin, etoposide, and Imfinzi for four cycles, followed by Imfinzi alone for three cycles. Treatment was halted due to elevated liver enzymes, which are now improving, with AST decreasing from 132 to 34 and ALT from 108 to  51. Recent chest imaging shows stable disease with no significant changes.  For prostate adenocarcinoma with neuroendocrine features, diagnosed in February 2024, he is on Zytiga 1000 mg daily with prednisone 5 mg. He receives Eligard injections every three months, with at least two injections noted. His PSA levels have decreased from 18 to under 2, indicating a positive response to treatment. There is concern that Zytiga may be contributing to elevated liver enzymes.  He experiences frequent urination, which is a significant issue. A CT scan showed bladder wall thickening, which is reportedly improving.  He has a history of elevated blood sugar levels attributed to prednisone use, managed with monitoring and medication provided by his primary care physician. No significant changes in lung cancer status, with recent chest scans showing stable disease. No new or worsening respiratory symptoms, such as shortness of breath, hemoptysis, or significant chest pain.        MEDICAL HISTORY: Past Medical History:  Diagnosis Date   Allergy    Arthritis    both knees    BCC (basal cell carcinoma), arm, right 09/2019   MOHS ([place])   BCC (basal cell carcinoma), face 2014   L preauricular s/p MOHS   BCC (basal cell carcinoma), face 02/2018   L upper lip   BPH (benign prostatic hypertrophy)    Dr. Mena Goes @ [facility]   Carotid stenosis 05/2015   RICA 40-59%, LICA 1-39%, L vertebral occlusion, rpt 1 yr   Cataract    removed both eyes    FHx: colon cancer    FHx: prostate cancer    Heart murmur    mild aortic stenosis  History of kidney stones    Hyperlipidemia    borderline- on rosuvastatin now normal    Occlusion of right vertebral artery 05/2015    ALLERGIES:  has no known allergies.  MEDICATIONS:  Current Outpatient Medications  Medication Sig Dispense Refill   abiraterone acetate (ZYTIGA) 250 MG tablet Take 1,000 mg by mouth daily.     Accu-Chek Softclix Lancets lancets  SMARTSIG:Topical     acetaminophen (TYLENOL) 500 MG tablet Take 1,000 mg by mouth every 6 (six) hours as needed for mild pain or headache.     Blood Glucose Monitoring Suppl DEVI 1 each by Does not apply route in the morning, at noon, and at bedtime. E11.69. May substitute to any manufacturer covered by patient's insurance. 1 each 0   Calcium Carb-Cholecalciferol (RA CALCIUM PLUS VITAMIN D) 600-10 MG-MCG TABS Take 600 mg by mouth daily.     fluticasone (FLONASE) 50 MCG/ACT nasal spray Place 1 spray into both nostrils daily as needed for allergies or rhinitis.     furosemide (LASIX) 20 MG tablet Take 1 tablet (20 mg total) by mouth as needed (take if increase in weight  of 2-3 lbs overnight or 8 lbs in a week). 90 tablet 3   glipiZIDE (GLUCOTROL) 5 MG tablet Take 1 tablet (5 mg total) by mouth daily with breakfast. As needed for high sugars with prednisone use 30 tablet 3   Glucose Blood (BLOOD GLUCOSE TEST STRIPS) STRP 1 each by In Vitro route in the morning, at noon, and at bedtime. May substitute to any manufacturer covered by patient's insurance. 100 strip 0   Lancet Device MISC 1 each by Does not apply route in the morning, at noon, and at bedtime. May substitute to any manufacturer covered by patient's insurance. 1 each 0   Lancets Misc. MISC 1 each by Does not apply route in the morning, at noon, and at bedtime. May substitute to any manufacturer covered by patient's insurance. 100 each 0   lidocaine-prilocaine (EMLA) cream Apply to the Port-A-Cath site 30-60 minutes before chemotherapy treatment (Patient taking differently: Apply 1 Application topically See admin instructions. Apply to the Port-A-Cath site 30-60 minutes before chemotherapy treatment) 30 g 0   methylPREDNISolone (MEDROL DOSEPAK) 4 MG TBPK tablet Use as instructed 21 tablet 0   metoprolol tartrate (LOPRESSOR) 25 MG tablet Take 0.5 tablets (12.5 mg total) by mouth 2 (two) times daily. 30 tablet 11   Multiple Vitamin (MULTIVITAMIN)  tablet Take 1 tablet by mouth daily with breakfast.     omeprazole (PRILOSEC) 20 MG capsule TAKE 1 CAPSULE BY MOUTH EVERY DAY 90 capsule 1   potassium chloride (KLOR-CON) 10 MEQ tablet Take 1 tablet (10 mEq total) by mouth as needed. 90 tablet 3   prochlorperazine (COMPAZINE) 10 MG tablet Take 1 tablet (10 mg total) by mouth every 6 (six) hours as needed for nausea or vomiting. 30 tablet 0   rosuvastatin (CRESTOR) 20 MG tablet Take 1 tablet (20 mg total) by mouth daily. 90 tablet 4   tamsulosin (FLOMAX) 0.4 MG CAPS capsule Take 0.4 mg by mouth 2 (two) times daily.     ticagrelor (BRILINTA) 90 MG TABS tablet Take 1 tablet (90 mg total) by mouth 2 (two) times daily. 60 tablet 11   Zinc 25 MG TABS Take 25 mg by mouth daily.     No current facility-administered medications for this visit.    SURGICAL HISTORY:  Past Surgical History:  Procedure Laterality Date   BASAL CELL CARCINOMA EXCISION  05/2018   lip, 2020 x3  basal cells removed    CATARACT EXTRACTION  2008   Left   CATARACT EXTRACTION  2013   Right ([place])   COLONOSCOPY  ~2010   medium int hemorrhoids, o/w WNL, rpt 5 yrs given fmhx (Dr. Randa Evens)   COLONOSCOPY  08/2019   TAs, diverticulosis, rpt 3 yrs ([place])   CORONARY/GRAFT ACUTE MI REVASCULARIZATION N/A 09/07/2022   Procedure: Coronary/Graft Acute MI Revascularization;  Surgeon: Orbie Pyo, MD;  Location: [facility];  Service: Cardiovascular;  Laterality: N/A;   ENDARTERECTOMY Right 12/01/2019   Procedure: RIGHT ENDARTERECTOMY CAROTID;  Surgeon: Cephus Shelling, MD;  Location: [facility] OR;  Service: Vascular;  Laterality: Right;   exercise treadmill  11/2005   WNL Eldridge Dace)   FINE NEEDLE ASPIRATION  07/28/2022   Procedure: FINE NEEDLE ASPIRATION (FNA) LINEAR;  Surgeon: Josephine Igo, DO;  Location: [facility] ENDOS[facility];  Service: Pulmonary;;   HERNIA REPAIR  2008   Right   IR IMAGING GUIDED PORT INSERTION  08/11/2022   IR IMAGING GUIDED PORT INSERTION  10/08/2022   IR  REMOVAL TUN ACCESS W/ PORT W/O FL MOD SED  09/10/2022   LEFT HEART CATH AND CORONARY ANGIOGRAPHY N/A 09/07/2022   Procedure: LEFT HEART CATH AND CORONARY ANGIOGRAPHY;  Surgeon: Orbie Pyo, MD;  Location: [facility];  Service: Cardiovascular;  Laterality: N/A;   MOHS SURGERY Left spring 2014   basal cell face   PATCH ANGIOPLASTY Right 12/01/2019   Procedure: PATCH ANGIOPLASTY USING Livia Snellen BIOLOGIC PATCH;  Surgeon: Cephus Shelling, MD;  Location: [facility] OR;  Service: Vascular;  Laterality: Right;   Testicular Biopsy  2003   benign, varicocele   US ECHOCARDIOGRAPHY  11/2005   aortic sclerosis, EF 55-60%, diastolic dysfunction   VIDEO BRONCHOSCOPY WITH ENDOBRONCHIAL ULTRASOUND Bilateral 07/28/2022   Procedure: VIDEO BRONCHOSCOPY WITH ENDOBRONCHIAL ULTRASOUND;  Surgeon: Josephine Igo, DO;  Location: [facility];  Service: Pulmonary;  Laterality: Bilateral;    REVIEW OF SYSTEMS:  Constitutional: positive for fatigue Eyes: negative Ears, nose, mouth, throat, and face: negative Respiratory: negative Cardiovascular: negative Gastrointestinal: negative Genitourinary:negative Integument/breast: negative Hematologic/lymphatic: negative Musculoskeletal:negative Neurological: negative Behavioral/Psych: negative Endocrine: negative Allergic/Immunologic: negative   PHYSICAL EXAMINATION: General appearance: alert, cooperative, fatigued, and no distress Head: Normocephalic, without obvious abnormality, atraumatic Neck: no adenopathy, no JVD, supple, symmetrical, trachea midline, and thyroid not enlarged, symmetric, no tenderness/mass/nodules Lymph nodes: Cervical, supraclavicular, and axillary nodes normal. Resp: clear to auscultation bilaterally Back: symmetric, no curvature. ROM normal. No CVA tenderness. Cardio: regular rate and rhythm, S1, S2 normal, no murmur, click, rub or gallop GI: soft, non-tender; bowel sounds normal; no masses,  no organomegaly Extremities: extremities  normal, atraumatic, no cyanosis or edema Neurologic: Alert and oriented X 3, normal strength and tone. Normal symmetric reflexes. Normal coordination and gait  ECOG PERFORMANCE STATUS: 1 - Symptomatic but completely ambulatory  Blood pressure 129/75, pulse (!) 51, temperature 97.8 F (36.6 C), temperature source Temporal, resp. rate 16, height 5\' 8"  (1.727 m), weight 194 lb 6.4 oz (88.2 kg), SpO2 100%.  LABORATORY DATA: Lab Results  Component Value Date   WBC 5.7 07/23/2023   HGB 13.3 07/23/2023   HCT 40.4 07/23/2023   MCV 103.3 (H) 07/23/2023   PLT 129 (L) 07/23/2023      Chemistry      Component Value Date/Time   NA 142 07/23/2023 0907   NA 140 05/31/2023 1417   K 3.5 07/23/2023 0907   CL 109  07/23/2023 0907   CO2 26 07/23/2023 0907   BUN 14 07/23/2023 0907   BUN 14 05/31/2023 1417   CREATININE 1.01 07/23/2023 0907      Component Value Date/Time   CALCIUM 9.4 07/23/2023 0907   ALKPHOS 137 (H) 07/23/2023 0907   AST 132 (H) 07/23/2023 0907   ALT 108 (H) 07/23/2023 0907   BILITOT 1.1 07/23/2023 0907       RADIOGRAPHIC STUDIES: No results found.  ASSESSMENT AND PLAN: This is a very pleasant 78 years old white male with: 1) Metastatic poorly differentiated carcinoma, neuroendocrine carcinoma of suspicious prostate primary versus primary lung cancer presented with innumerable and bilateral pulmonary nodules in addition to right hilar and mediastinal lymphadenopathy and metastatic disease to several areas of the bone in addition to hypermetabolic activity in the prostate gland diagnosed in January 2024  The patient is currently undergoing systemic chemotherapy with carboplatin for AUC of 5 on day 1, Imfinzi 1500 Mg IV on day 1, etoposide 100 Mg/M2 on days 1, 2 and 3 with Neulasta support every 3 weeks status post 7 cycles.  Starting from cycle #5 the patient will be on maintenance treatment with Imfinzi 1500 Mg IV every 4 weeks.  His treatment was discontinued secondary to  liver dysfunction.    Metastatic Poorly Differentiated Neuroendocrine Carcinoma Diagnosed January 2024. Completed four cycles of carboplatin, etoposide, and Imfinzi, followed by three cycles of Imfinzi alone. Treatment halted due to elevated liver enzymes. Recent chest scan shows no significant changes since October/November. Liver enzymes improved but remain elevated. Disease is stable; treatment will resume if progression is observed. - Monitor liver enzymes - Await radiologist's report on chest scan - Follow-up in three months  Prostate Adenocarcinoma with Neuroendocrine Features Diagnosed February 2024. On Zytiga 1000 mg daily with prednisone 5 mg and Eligard injections every three months. PSA decreased from 18 to <2. Reports frequent urination; CT shows bladder wall thickening. Elevated liver enzymes potentially related to Zytiga. Discussed switching to Xtandi to avoid prednisone-induced hyperglycemia. - Discuss liver enzyme elevation with urologist - Consider switching to [facility] - Follow-up with urologist on August 25, 2023  Prednisone-Induced Hyperglycemia Reports elevated blood sugar levels on prednisone. Primary care physician provided monitoring tools. Hyperglycemia likely induced by prednisone. Discussed alternative regimen Xtandi that does not require prednisone. - Discuss alternative regimen with urologist - Consider switching to [facility]  Follow-up - Follow-up with oncologist in three months - Follow-up with urologist on August 25, 2023.     The patient was advised to call immediately if he has any other concerning symptoms in the interval. The patient voices understanding of current disease status and treatment options and is in agreement with the current care plan.  All questions were answered. The patient knows to call the clinic with any problems, questions or concerns. We can certainly see the patient much sooner if necessary.  The total time spent in the appointment  was 30 minutes.  Disclaimer: This note was dictated with voice recognition software. Similar sounding words can inadvertently be transcribed and may not be corrected upon review.

## 2023-07-31 ENCOUNTER — Other Ambulatory Visit: Payer: Self-pay

## 2023-08-05 DIAGNOSIS — M25552 Pain in left hip: Secondary | ICD-10-CM | POA: Diagnosis not present

## 2023-08-05 DIAGNOSIS — M170 Bilateral primary osteoarthritis of knee: Secondary | ICD-10-CM | POA: Diagnosis not present

## 2023-08-06 ENCOUNTER — Other Ambulatory Visit: Payer: Self-pay

## 2023-08-07 ENCOUNTER — Other Ambulatory Visit: Payer: Self-pay

## 2023-08-10 ENCOUNTER — Other Ambulatory Visit: Payer: Self-pay | Admitting: Physician Assistant

## 2023-08-10 ENCOUNTER — Encounter: Payer: Self-pay | Admitting: Family Medicine

## 2023-08-10 ENCOUNTER — Ambulatory Visit (INDEPENDENT_AMBULATORY_CARE_PROVIDER_SITE_OTHER): Payer: Medicare Other | Admitting: Family Medicine

## 2023-08-10 ENCOUNTER — Telehealth: Payer: Self-pay | Admitting: Cardiovascular Disease

## 2023-08-10 VITALS — BP 118/78 | HR 70 | Temp 97.7°F | Ht 68.0 in | Wt 190.1 lb

## 2023-08-10 DIAGNOSIS — C7A1 Malignant poorly differentiated neuroendocrine tumors: Secondary | ICD-10-CM

## 2023-08-10 DIAGNOSIS — E1169 Type 2 diabetes mellitus with other specified complication: Secondary | ICD-10-CM | POA: Diagnosis not present

## 2023-08-10 DIAGNOSIS — I25110 Atherosclerotic heart disease of native coronary artery with unstable angina pectoris: Secondary | ICD-10-CM

## 2023-08-10 DIAGNOSIS — Z955 Presence of coronary angioplasty implant and graft: Secondary | ICD-10-CM | POA: Diagnosis not present

## 2023-08-10 DIAGNOSIS — I350 Nonrheumatic aortic (valve) stenosis: Secondary | ICD-10-CM

## 2023-08-10 DIAGNOSIS — C61 Malignant neoplasm of prostate: Secondary | ICD-10-CM

## 2023-08-10 DIAGNOSIS — Z7984 Long term (current) use of oral hypoglycemic drugs: Secondary | ICD-10-CM

## 2023-08-10 DIAGNOSIS — T50905A Adverse effect of unspecified drugs, medicaments and biological substances, initial encounter: Secondary | ICD-10-CM

## 2023-08-10 NOTE — Telephone Encounter (Signed)
Called patient, he states that his last ECHO was 08/2022- he seen PCP today (notes in epic) they questioned when he needed to have ECHO repeated. Patient has upcoming appointment with Dr.Arida in June, do you want to have testing repeated now, before appointment or wait until appointment time.   Advised with patient I would send back a message and would call back with recommendations.

## 2023-08-10 NOTE — Patient Instructions (Addendum)
Call cardiology to ask about scheduling heart ultrasound 08/2023 (per latest cardiology note).  Continue glipizide 5mg  daily with breakfast while on prednisone. Let us know if sugars start running abnormal.  Good to see you today Schedule lab visit only after 09/27/2023 for A1c.

## 2023-08-10 NOTE — Progress Notes (Unsigned)
Ph: [phone] Fax: [phone]   Patient ID: Larry Kent, male    DOB: 09-15-1945, 78 y.o.   MRN: [id number]  This visit was conducted in person.  BP 118/78   Pulse 70   Temp 97.7 F (36.5 C) (Oral)   Ht 5\' 8"  (1.727 m)   Wt 190 lb 2 oz (86.2 kg)   SpO2 99%   BMI 28.91 kg/m    CC: 6 wk DM f/u visit  Subjective:   HPI: Larry Kent is a 78 y.o. male presenting on 08/10/2023 for Medical Management of Chronic Issues (Here for 6 wk DM f/u.)   Continues prednisone 5mg  twice daily.  Prednisone induced DM - does regularly check sugars fasting average 110s. Compliant with antihyperglycemic regimen which includes: glipizide 5mg  with breakfast while on prednisone. Denies low sugars or hypoglycemic symptoms. Denies paresthesias, blurry vision. Has not had diabetic eye exam but sees [place] [facility] (Dr Sherryll Burger). Glucometer brand: accuchek. Last foot exam: 05/2023. DSME: has not completed. 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 Also with prostate cancer diagnosed 07/2022 followed by urology on Zytiga and Eligard and prednisone 5mg  twice daily. Regularly sees oncology, urology, on palliative immunotherapy through oncology (currently on hold until transaminitis improved). Most recent imaging CT this month showing left bladder wall thickening rec cystoscopy to r/o primary bladder cancer - pending appt later this month. CT also noted cirrhotic changes to liver. No alcohol history.    CAD - inferior STEMI 08/2022 s/p hospitalization with catheterization and RCA stent placement DES x4. He completed cardiac rehab. Regularly uses compression stockings.   Marked heart murmur noted today - known degenerative mitral valve changes  without stenosis on latest echo 08/2022, also with known mild AS and moderate AR.      Relevant past medical, surgical, family and social history reviewed and updated as indicated. Interim medical history since our last visit reviewed. Allergies and medications reviewed and updated. Outpatient Medications Prior to Visit  Medication Sig Dispense Refill   abiraterone acetate (ZYTIGA) 250 MG tablet Take 1,000 mg by mouth daily.     Accu-Chek Softclix Lancets lancets SMARTSIG:Topical     acetaminophen (TYLENOL) 500 MG tablet Take 1,000 mg by mouth every 6 (six) hours as needed for mild pain or headache.     Blood Glucose Monitoring Suppl DEVI 1 each by Does not apply route in the morning, at noon, and at bedtime. E11.69. May substitute to any manufacturer covered by patient's insurance. 1 each 0   Calcium Carb-Cholecalciferol (RA CALCIUM PLUS VITAMIN D) 600-10 MG-MCG TABS Take 600 mg by mouth daily.     fluticasone (FLONASE) 50 MCG/ACT nasal spray Place 1 spray into both nostrils daily as needed for allergies or rhinitis.     furosemide (LASIX) 20 MG tablet Take 1 tablet (20 mg total) by mouth as needed (take if increase in weight  of 2-3 lbs overnight or 8 lbs in a week). 90 tablet 3   glipiZIDE (GLUCOTROL) 5 MG tablet Take 1 tablet (5 mg total) by mouth daily with breakfast. As needed for high sugars with prednisone use 30 tablet 3   lidocaine-prilocaine (EMLA) cream Apply to the Port-A-Cath site 30-60 minutes before chemotherapy treatment (Patient taking differently:  Apply 1 Application topically See admin instructions. Apply to the Port-A-Cath site 30-60 minutes before chemotherapy treatment) 30 g 0   metoprolol tartrate (LOPRESSOR) 25 MG tablet Take 0.5 tablets (12.5 mg total) by mouth 2 (two) times daily. 30 tablet 11   Multiple Vitamin (MULTIVITAMIN) tablet Take 1 tablet by mouth daily with breakfast.     omeprazole (PRILOSEC) 20 MG capsule TAKE 1 CAPSULE BY MOUTH EVERY DAY 90 capsule 1    potassium chloride (KLOR-CON) 10 MEQ tablet Take 1 tablet (10 mEq total) by mouth as needed. 90 tablet 3   predniSONE (DELTASONE) 5 MG tablet Take 1 tablet (5 mg total) by mouth 2 (two) times daily with a meal.     prochlorperazine (COMPAZINE) 10 MG tablet Take 1 tablet (10 mg total) by mouth every 6 (six) hours as needed for nausea or vomiting. 30 tablet 0   rosuvastatin (CRESTOR) 20 MG tablet Take 1 tablet (20 mg total) by mouth daily. 90 tablet 4   tamsulosin (FLOMAX) 0.4 MG CAPS capsule Take 0.4 mg by mouth 2 (two) times daily.     ticagrelor (BRILINTA) 90 MG TABS tablet Take 1 tablet (90 mg total) by mouth 2 (two) times daily. 60 tablet 11   Zinc 25 MG TABS Take 25 mg by mouth daily.     methylPREDNISolone (MEDROL DOSEPAK) 4 MG TBPK tablet Use as instructed 21 tablet 0   No facility-administered medications prior to visit.     Per HPI unless specifically indicated in ROS section below Review of Systems  Objective:  BP 118/78   Pulse 70   Temp 97.7 F (36.5 C) (Oral)   Ht 5\' 8"  (1.727 m)   Wt 190 lb 2 oz (86.2 kg)   SpO2 99%   BMI 28.91 kg/m   Wt Readings from Last 3 Encounters:  08/10/23 190 lb 2 oz (86.2 kg)  07/29/23 194 lb 6.4 oz (88.2 kg)  06/29/23 189 lb (85.7 kg)      Physical Exam Vitals and nursing note reviewed.  Constitutional:      Appearance: Normal appearance. He is not ill-appearing.  HENT:     Mouth/Throat:     Mouth: Mucous membranes are moist.     Pharynx: Oropharynx is clear. No oropharyngeal exudate or posterior oropharyngeal erythema.  Eyes:     Extraocular Movements: Extraocular movements intact.     Pupils: Pupils are equal, round, and reactive to light.  Cardiovascular:     Rate and Rhythm: Normal rate and regular rhythm.     Pulses: Normal pulses.     Heart sounds: Murmur (3/6 systolic throughout) heard.  Pulmonary:     Effort: Pulmonary effort is normal. No respiratory distress.     Breath sounds: Normal breath sounds. No wheezing, rhonchi  or rales.  Musculoskeletal:     Right lower leg: No edema.     Left lower leg: No edema.  Skin:    General: Skin is warm and dry.     Findings: No rash.  Neurological:     Mental Status: He is alert.       Results for orders placed or performed in visit on 07/29/23  CMP (Cancer Center only)   Collection Time: 07/29/23 10:07 AM  Result Value Ref Range   Sodium 138 135 - 145 mmol/L   Potassium 4.6 3.5 - 5.1 mmol/L   Chloride 106 98 - 111 mmol/L   CO2 28 22 - 32 mmol/L   Glucose, Bld 138 (H) 70 -  99 mg/dL   BUN 21 8 - 23 mg/dL   Creatinine 4.69 6.29 - 1.24 mg/dL   Calcium 9.2 8.9 - 52.8 mg/dL   Total Protein 6.4 (L) 6.5 - 8.1 g/dL   Albumin 3.7 3.5 - 5.0 g/dL   AST 34 15 - 41 U/L   ALT 51 (H) 0 - 44 U/L   Alkaline Phosphatase 196 (H) 38 - 126 U/L   Total Bilirubin 1.1 0.0 - 1.2 mg/dL   GFR, Estimated >41 >32 mL/min   Anion gap 4 (L) 5 - 15  CBC with Differential (Cancer Center Only)   Collection Time: 07/29/23 10:07 AM  Result Value Ref Range   WBC Count 8.6 4.0 - 10.5 K/uL   RBC 3.66 (L) 4.22 - 5.81 MIL/uL   Hemoglobin 12.8 (L) 13.0 - 17.0 g/dL   HCT 44.0 (L) 10.2 - 72.5 %   MCV 102.7 (H) 80.0 - 100.0 fL   MCH 35.0 (H) 26.0 - 34.0 pg   MCHC 34.0 30.0 - 36.0 g/dL   RDW 36.6 44.0 - 34.7 %   Platelet Count 142 (L) 150 - 400 K/uL   nRBC 0.2 0.0 - 0.2 %   Neutrophils Relative % 70 %   Neutro Abs 6.0 1.7 - 7.7 K/uL   Lymphocytes Relative 16 %   Lymphs Abs 1.4 0.7 - 4.0 K/uL   Monocytes Relative 12 %   Monocytes Absolute 1.0 0.1 - 1.0 K/uL   Eosinophils Relative 1 %   Eosinophils Absolute 0.1 0.0 - 0.5 K/uL   Basophils Relative 0 %   Basophils Absolute 0.0 0.0 - 0.1 K/uL   Immature Granulocytes 1 %   Abs Immature Granulocytes 0.07 0.00 - 0.07 K/uL    Assessment & Plan:   Problem List Items Addressed This Visit     Coronary artery disease involving native coronary artery of native heart with unstable angina pectoris (HCC) (Chronic)   Continue rosuvastatin,  brilinta      Aortic stenosis   H/o this, followed by cardiology Per last cards note, plan was rpt echo 08/2023 - he will contact them to get this scheduled      Large cell neuroendocrine carcinoma (HCC)   Appreciate onc care.       Relevant Medications   predniSONE (DELTASONE) 5 MG tablet   Prostate cancer metastatic to multiple sites [facility])   Appreciate urology/onc care.       Relevant Medications   predniSONE (DELTASONE) 5 MG tablet   S/P coronary artery stent placement   Type 2 diabetes mellitus with other specified complication (HCC) - Primary   Prednisone induced diabetes, readings are improving since starting glipizide 5mg  daily with breakfast. Continue this. May be able to stop if he comes off prednisone.  Too soon for A1c - will return in 2 months for POC A1c - ordered       Relevant Orders   POCT glycosylated hemoglobin (Hb A1C)   Microalbumin / creatinine urine ratio     No orders of the defined types were placed in this encounter.   Orders Placed This Encounter  Procedures   Microalbumin / creatinine urine ratio    Standing Status:   Future    Expiration Date:   08/10/2024   POCT glycosylated hemoglobin (Hb A1C)    Standing Status:   Future    Expiration Date:   11/08/2023    Patient Instructions  Call cardiology to ask about scheduling heart ultrasound 08/2023 (per latest cardiology note).  Continue glipizide 5mg  daily with breakfast while on prednisone. Let us know if sugars start running abnormal.  Good to see you today Schedule lab visit only after 09/27/2023 for A1c.   Follow up plan: No follow-ups on file.  [name], MD

## 2023-08-10 NOTE — Telephone Encounter (Signed)
Patient calling to see when his next ultrasound will be. Please advise

## 2023-08-11 ENCOUNTER — Other Ambulatory Visit: Payer: Self-pay

## 2023-08-11 DIAGNOSIS — M79671 Pain in right foot: Secondary | ICD-10-CM | POA: Diagnosis not present

## 2023-08-11 DIAGNOSIS — M25571 Pain in right ankle and joints of right foot: Secondary | ICD-10-CM | POA: Diagnosis not present

## 2023-08-11 DIAGNOSIS — M25771 Osteophyte, right ankle: Secondary | ICD-10-CM | POA: Diagnosis not present

## 2023-08-11 DIAGNOSIS — M25572 Pain in left ankle and joints of left foot: Secondary | ICD-10-CM | POA: Diagnosis not present

## 2023-08-11 DIAGNOSIS — M79672 Pain in left foot: Secondary | ICD-10-CM | POA: Diagnosis not present

## 2023-08-11 DIAGNOSIS — M25772 Osteophyte, left ankle: Secondary | ICD-10-CM | POA: Diagnosis not present

## 2023-08-11 NOTE — Assessment & Plan Note (Signed)
H/o this, followed by cardiology Per last cards note, plan was rpt echo 08/2023 - he will contact them to get this scheduled

## 2023-08-11 NOTE — Telephone Encounter (Signed)
His last echo showed the aortic stenosis was mild.  Thus, we can wait a little longer before obtaining a follow-up echocardiogram.  I can discuss with him during his follow-up visit in June.

## 2023-08-11 NOTE — Telephone Encounter (Signed)
Called patient, advised of message from MD.   Patient also requesting if we can do something else other than Brilinta- he states he pays a lot for this, and by the end of year he's in the donut hole and does not want to go through this again this year.   Advised I would send to MD to review. Thanks!

## 2023-08-11 NOTE — Assessment & Plan Note (Signed)
Appreciate urology/onc care.

## 2023-08-11 NOTE — Assessment & Plan Note (Addendum)
Prednisone induced diabetes, readings are improving since starting glipizide 5mg  daily with breakfast. Continue this. May be able to stop if he comes off prednisone.  Too soon for A1c - will return in 2 months for POC A1c - ordered

## 2023-08-11 NOTE — Assessment & Plan Note (Signed)
Continue rosuvastatin, brilinta

## 2023-08-11 NOTE — Assessment & Plan Note (Signed)
Appreciate onc care.

## 2023-08-12 ENCOUNTER — Other Ambulatory Visit: Payer: Self-pay | Admitting: Physician Assistant

## 2023-08-12 ENCOUNTER — Other Ambulatory Visit (HOSPITAL_COMMUNITY): Payer: Self-pay

## 2023-08-12 DIAGNOSIS — C3490 Malignant neoplasm of unspecified part of unspecified bronchus or lung: Secondary | ICD-10-CM

## 2023-08-12 MED ORDER — ASPIRIN 81 MG PO TBEC: 81.0000 mg | DELAYED_RELEASE_TABLET | Freq: Every day | ORAL | Status: AC

## 2023-08-12 MED ORDER — PANTOPRAZOLE SODIUM 20 MG PO TBEC: 20.0000 mg | DELAYED_RELEASE_TABLET | Freq: Every day | ORAL | 1 refills | Status: DC

## 2023-08-12 MED ORDER — CLOPIDOGREL BISULFATE 75 MG PO TABS
ORAL_TABLET | ORAL | 0 refills | Status: DC
  Filled 2023-08-12: qty 31, 31d supply, fill #0

## 2023-08-12 NOTE — Telephone Encounter (Signed)
Patient has been made aware to start Plavix 75 mg once daily with a loading dose of 300 mg on the first day.   He will also start 81 mg Aspirin.

## 2023-08-12 NOTE — Telephone Encounter (Signed)
We can switch to Plavix 75 mg once daily with 300 mg loading dose on the first day.  He should also be taking aspirin 81 mg daily.

## 2023-08-16 ENCOUNTER — Other Ambulatory Visit (HOSPITAL_COMMUNITY): Payer: Self-pay

## 2023-08-16 ENCOUNTER — Encounter: Payer: Self-pay | Admitting: Dietician

## 2023-08-16 ENCOUNTER — Encounter: Payer: Medicare Other | Attending: Family Medicine | Admitting: Dietician

## 2023-08-16 VITALS — Ht 68.0 in | Wt 196.0 lb

## 2023-08-16 DIAGNOSIS — E1169 Type 2 diabetes mellitus with other specified complication: Secondary | ICD-10-CM | POA: Diagnosis not present

## 2023-08-16 NOTE — Progress Notes (Signed)
 Diabetes Self-Management Education  Visit Type: First/Initial  Appt. Start Time: 1530 Appt. End Time: 1445  08/16/2023  [name], identified by name and date of birth, is a 78 y.o. male with a diagnosis of Diabetes: Type 2.   ASSESSMENT Patient is here today alone.   He has started checking his blood glucose and fasting reading about 119 and 179 - 3 hours after a meal in office. Referral:  Type 2 diabetes  History:  Type 2 Diabetes (05/2023), HLD, prostate and lung cancer treated with chemo and immuno therapy 1-01/2023 and MI 07/2022, elevated liver enzymes fall 2024 and was put on prednisone and now continues on his hormone therapy as well. Medications/Supplements include:  glipizide, lasix prn, potassium, zinc, calcium with Vitamin D at times, MVI Labs noted to include:  A1c 7.8%  06/29/2023 increased from 6.3% 11/2022, GFR >60,  Alkaline Phosphatase 196, AST 34, ALT 51 on 07/29/2023 Lipid Panel     Component Value Date/Time   CHOL 139 12/22/2022 0811   CHOL 128 09/12/2018 1118   TRIG 63.0 12/22/2022 0811   HDL 62.50 12/22/2022 0811   HDL 57 09/12/2018 1118   CHOLHDL 2 12/22/2022 0811   VLDL 12.6 12/22/2022 0811   LDLCALC 64 12/22/2022 0811   LDLCALC 57 09/12/2018 1118   LDLDIRECT 144.8 02/09/2012 0928   LABVLDL 14 09/12/2018 1118   Weight hx: 68" 196 lbs 08/16/2023 190 lbs 08/10/2023 He weighs at home to monitor for fluid weight. UBW 185-190 lbs  Patient lives with his wife and son.  He does most of the shopping and cooking.  His son is supportive and eats very healthy. Wife has short term memory loss and increased stress from his health concerns. Patient has stress related to his health. 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  He continues to work but does the [employer]. Choosing salt free seasonings from [employer] along with other herbs.  Was very strict with his diet about 10 years ago.     He is walking or gym or recumbent  bike  30 minutes most every day. Went to a nutrition class in [facility] last year.  Height 5\' 8"  (1.727 m), weight 196 lb (88.9 kg). Body mass index is 29.8 kg/m.   Diabetes Self-Management Education - 08/16/23 1604       Visit Information   Visit Type First/Initial      Initial Visit   Diabetes Type Type 2    Date Diagnosed 05/2023    Are you currently following a meal plan? No    Are you taking your medications as prescribed? Yes      Psychosocial Assessment   Patient Belief/Attitude about Diabetes Motivated to manage diabetes    What is the hardest part about your diabetes right now, causing you the most concern, or is the most worrisome to you about your diabetes?   Checking blood sugar    Self-care barriers None    Self-management support Doctor's office;Family    Other persons present Patient    Patient Concerns Nutrition/Meal planning    Special Needs None    Learning Readiness Ready    How often do you need to have someone help you when you read instructions, pamphlets, or other written materials from your doctor or pharmacy? 1 - Never    What is the last grade level you completed in school? 12      Pre-Education Assessment   Patient understands the diabetes  disease and treatment process. Needs Instruction    Patient understands incorporating nutritional management into lifestyle. Needs Instruction    Patient undertands incorporating physical activity into lifestyle. Needs Instruction    Patient understands using medications safely. Needs Instruction    Patient understands monitoring blood glucose, interpreting and using results Needs Instruction    Patient understands prevention, detection, and treatment of acute complications. Needs Instruction    Patient understands prevention, detection, and treatment of chronic complications. Needs Instruction    Patient understands how to develop strategies to address psychosocial issues. Needs Instruction    Patient understands  how to develop strategies to promote health/change behavior. Needs Instruction      Complications   Last HgB A1C per patient/outside source 7.8 %   06/29/2023   How often do you check your blood sugar? 1-2 times/day    Fasting Blood glucose range (mg/dL) 16-109    Postprandial Blood glucose range (mg/dL) 604-540    Number of hypoglycemic episodes per month 0    Have you had a dilated eye exam in the past 12 months? Yes    Have you had a dental exam in the past 12 months? Yes    Are you checking your feet? Yes    How many days per week are you checking your feet? 5      Dietary Intake   Breakfast plain cheerios, protein granola, 2% milk or [place] yogurt or occasional sugar free coffee creamer    Snack (morning) none    Lunch homemade Chili, 1/2 apple, LS corn chips, 1 piece Valentine's candy OR [place] sandwich, pretzels    Snack (afternoon) apple or banana or orange    Dinner chili, slaw    Snack (evening) orange, peanuts, cashews, greek yogurt    Beverage(s) water, crystal light, coffee (black), occasional tea - Milo sugar free      Activity / Exercise   Activity / Exercise Type Light (walking / raking leaves)    How many days per week do you exercise? 5    How many minutes per day do you exercise? 30    Total minutes per week of exercise 150      Patient Education   Previous Diabetes Education No    Disease Pathophysiology Definition of diabetes, type 1 and 2, and the diagnosis of diabetes    Healthy Eating Role of diet in the treatment of diabetes and the relationship between the three main macronutrients and blood glucose level;Food label reading, portion sizes and measuring food.;Plate Method;Meal options for control of blood glucose level and chronic complications.    Being Active Role of exercise on diabetes management, blood pressure control and cardiac health.    Medications Reviewed patients medication for diabetes, action, purpose, timing of dose and side effects.     Monitoring Identified appropriate SMBG and/or A1C goals.;Taught/evaluated SMBG meter.   he has been having getting blood with his lancets and worked on problem solving   Acute complications Taught prevention, symptoms, and  treatment of hypoglycemia - the 15 rule.    Chronic complications Relationship between chronic complications and blood glucose control    Diabetes Stress and Support Identified and addressed patients feelings and concerns about diabetes;Worked with patient to identify barriers to care and solutions;Role of stress on diabetes      Individualized Goals (developed by patient)   Nutrition General guidelines for healthy choices and portions discussed    Physical Activity Exercise 5-7 days per week;30 minutes per day  Medications take my medication as prescribed    Monitoring  Test my blood glucose as discussed    Problem Solving Other (comment)   stress control   Reducing Risk examine blood glucose patterns;do foot checks daily;treat hypoglycemia with 15 grams of carbs if blood glucose less than 70mg /dL      Post-Education Assessment   Patient understands the diabetes disease and treatment process. Demonstrates understanding / competency    Patient understands incorporating nutritional management into lifestyle. Demonstrates understanding / competency    Patient undertands incorporating physical activity into lifestyle. Demonstrates understanding / competency    Patient understands using medications safely. Demonstrates understanding / competency    Patient understands monitoring blood glucose, interpreting and using results Demonstrates understanding / competency    Patient understands prevention, detection, and treatment of acute complications. Demonstrates understanding / competency    Patient understands prevention, detection, and treatment of chronic complications. Demonstrates understanding / competency    Patient understands how to develop strategies to address  psychosocial issues. Demonstrates understanding / competency    Patient understands how to develop strategies to promote health/change behavior. Demonstrates understanding / competency      Outcomes   Expected Outcomes Demonstrated interest in learning. Expect positive outcomes    Future DMSE PRN    Program Status Completed             Individualized Plan for Diabetes Self-Management Training:   Learning Objective:  Patient will have a greater understanding of diabetes self-management. Patient education plan is to attend individual and/or group sessions per assessed needs and concerns.   Plan:   Patient Instructions  Plan:  Aim for 4 Carb Choices per meal (60 grams) +/- 1 either way  Aim for 0-1 Carbs per snack if hungry  Include protein in moderation with your meals and snacks Consider reading food labels for Total Carbohydrate of foods Consider  increasing your activity level by walking, bike, etc. for 30 minutes daily as tolerated Consider checking BG at alternate times per day  Continue taking medication as directed by MD  Continue to follow a low sodium diet.     Expected Outcomes:  Demonstrated interest in learning. Expect positive outcomes  Education material provided: ADA - How to Thrive: A Guide for Your Journey with Diabetes, Food label handouts, Meal plan card, Snack sheet, and Diabetes Resources  If problems or questions, patient to contact team via:  Phone  Future DSME appointment: PRN

## 2023-08-16 NOTE — Patient Instructions (Addendum)
 Plan:  Aim for 4 Carb Choices per meal (60 grams) +/- 1 either way  Aim for 0-1 Carbs per snack if hungry  Include protein in moderation with your meals and snacks Consider reading food labels for Total Carbohydrate of foods Consider  increasing your activity level by walking, bike, etc. for 30 minutes daily as tolerated Consider checking BG at alternate times per day  Continue taking medication as directed by MD  Continue to follow a low sodium diet.

## 2023-08-19 ENCOUNTER — Encounter: Payer: Self-pay | Admitting: Gastroenterology

## 2023-08-24 ENCOUNTER — Other Ambulatory Visit: Payer: Self-pay | Admitting: Cardiovascular Disease

## 2023-08-24 NOTE — Telephone Encounter (Signed)
° ° ° °*  STAT* If patient is at the pharmacy, call can be transferred to refill team.   1. Which medications need to be refilled? (please list name of each medication and dose if known)   metoprolol tartrate (LOPRESSOR) 25 MG tablet    2. Which pharmacy/location (including street and city if local pharmacy) is medication to be sent to? [id number] - [place], [place] - [address]  3. Do they need a 30 day or 90 day supply? 90  

## 2023-08-24 NOTE — Telephone Encounter (Signed)
 This is a Educational psychologist pt

## 2023-08-25 DIAGNOSIS — N3289 Other specified disorders of bladder: Secondary | ICD-10-CM | POA: Diagnosis not present

## 2023-08-25 MED ORDER — METOPROLOL TARTRATE 25 MG PO TABS: 12.5000 mg | ORAL_TABLET | Freq: Two times a day (BID) | ORAL | 0 refills | Status: DC

## 2023-08-26 ENCOUNTER — Telehealth: Payer: Self-pay | Admitting: Cardiovascular Disease

## 2023-08-26 DIAGNOSIS — I679 Cerebrovascular disease, unspecified: Secondary | ICD-10-CM

## 2023-08-26 MED ORDER — ROSUVASTATIN CALCIUM 5 MG PO TABS: 5.0000 mg | ORAL_TABLET | Freq: Every day | ORAL | 1 refills | Status: DC

## 2023-08-26 NOTE — Telephone Encounter (Signed)
 Pt c/o medication issue:  1. Name of Medication:   Nubeqa, 300 mg  2. How are you currently taking this medication (dosage and times per day)?   Not taking yet  3. Are you having a reaction (difficulty breathing--STAT)?   4. What is your medication issue?   Patient stated he is to start taking this medication and wants to confirm it is compatible with his heart medications.  Patient stated he is to take 2 tablets in the morning and 2 tablets in the evening.

## 2023-08-26 NOTE — Telephone Encounter (Signed)
 The patient has been made aware and 5 mg Rosuvastatin has been sent in for him.

## 2023-08-26 NOTE — Telephone Encounter (Signed)
 One interaction noted. Concurrent use of DAROLUTAMIDE and ROSUVASTATIN may result in increased rosuvastatin exposure and an increased risk of myopathy and rhabdomyolysis.  Patient will need to reduce his dose of rosuvastatin to 5mg  daily

## 2023-08-31 ENCOUNTER — Telehealth: Payer: Self-pay | Admitting: Cardiovascular Disease

## 2023-08-31 ENCOUNTER — Other Ambulatory Visit: Payer: Self-pay | Admitting: Physician Assistant

## 2023-08-31 DIAGNOSIS — T50905A Adverse effect of unspecified drugs, medicaments and biological substances, initial encounter: Secondary | ICD-10-CM

## 2023-08-31 NOTE — Telephone Encounter (Signed)
 Pt c/o medication issue:  1. Name of Medication:   aspirin EC 81 MG tablet   2. How are you currently taking this medication (dosage and times per day)?   3. Are you having a reaction (difficulty breathing--STAT)?   4. What is your medication issue?   Patient wants to know if he should be taking this medication as he recently had his rosuvastatin dosage decreased to 5 mg

## 2023-08-31 NOTE — Telephone Encounter (Signed)
 Completed course

## 2023-08-31 NOTE — Telephone Encounter (Signed)
 Call placed to the patient. He was calling to see if he should be on the 81 mg aspirin. He has not been taking it.   He was advised that when he switched to the Plavix, he was also advised to start the baby Aspirin 81 mg once daily.

## 2023-09-01 NOTE — Telephone Encounter (Signed)
The patient has been made aware and verbalized his understanding.  

## 2023-09-01 NOTE — Telephone Encounter (Signed)
 Yes, he should be on aspirin 81 mg once daily with clopidogrel.  It was fine not to be on aspirin when he was on Brilinta .

## 2023-09-05 ENCOUNTER — Other Ambulatory Visit: Payer: Self-pay | Admitting: Physician Assistant

## 2023-09-05 DIAGNOSIS — C3490 Malignant neoplasm of unspecified part of unspecified bronchus or lung: Secondary | ICD-10-CM

## 2023-09-09 DIAGNOSIS — M170 Bilateral primary osteoarthritis of knee: Secondary | ICD-10-CM | POA: Diagnosis not present

## 2023-09-16 DIAGNOSIS — M170 Bilateral primary osteoarthritis of knee: Secondary | ICD-10-CM | POA: Diagnosis not present

## 2023-09-23 DIAGNOSIS — M170 Bilateral primary osteoarthritis of knee: Secondary | ICD-10-CM | POA: Diagnosis not present

## 2023-09-25 ENCOUNTER — Other Ambulatory Visit: Payer: Self-pay | Admitting: Family Medicine

## 2023-09-28 ENCOUNTER — Other Ambulatory Visit (HOSPITAL_COMMUNITY): Payer: Self-pay

## 2023-09-28 ENCOUNTER — Other Ambulatory Visit: Payer: Self-pay | Admitting: *Deleted

## 2023-09-28 MED ORDER — CLOPIDOGREL BISULFATE 75 MG PO TABS: 75.0000 mg | ORAL_TABLET | Freq: Every day | ORAL | 1 refills | Status: DC

## 2023-09-30 ENCOUNTER — Other Ambulatory Visit: Payer: Medicare Other

## 2023-09-30 ENCOUNTER — Encounter: Payer: Self-pay | Admitting: Family Medicine

## 2023-09-30 DIAGNOSIS — E1169 Type 2 diabetes mellitus with other specified complication: Secondary | ICD-10-CM | POA: Diagnosis not present

## 2023-09-30 LAB — POCT GLYCOSYLATED HEMOGLOBIN (HGB A1C): Hemoglobin A1C: 6.6 % — AB (ref 4.0–5.6)

## 2023-09-30 LAB — MICROALBUMIN / CREATININE URINE RATIO
Creatinine,U: 105.9 mg/dL
Microalb Creat Ratio: 20.5 mg/g (ref 0.0–30.0)
Microalb, Ur: 2.2 mg/dL — ABNORMAL HIGH (ref 0.0–1.9)

## 2023-10-01 ENCOUNTER — Telehealth: Payer: Self-pay

## 2023-10-01 ENCOUNTER — Encounter: Payer: Self-pay | Admitting: Gastroenterology

## 2023-10-01 ENCOUNTER — Other Ambulatory Visit (INDEPENDENT_AMBULATORY_CARE_PROVIDER_SITE_OTHER)

## 2023-10-01 ENCOUNTER — Ambulatory Visit: Payer: Medicare Other | Admitting: Gastroenterology

## 2023-10-01 VITALS — BP 104/60 | HR 68 | Ht 68.0 in | Wt 199.0 lb

## 2023-10-01 DIAGNOSIS — R7989 Other specified abnormal findings of blood chemistry: Secondary | ICD-10-CM | POA: Diagnosis not present

## 2023-10-01 DIAGNOSIS — Z860101 Personal history of adenomatous and serrated colon polyps: Secondary | ICD-10-CM

## 2023-10-01 DIAGNOSIS — Z955 Presence of coronary angioplasty implant and graft: Secondary | ICD-10-CM | POA: Diagnosis not present

## 2023-10-01 DIAGNOSIS — Z860100 Personal history of colon polyps, unspecified: Secondary | ICD-10-CM

## 2023-10-01 DIAGNOSIS — I2119 ST elevation (STEMI) myocardial infarction involving other coronary artery of inferior wall: Secondary | ICD-10-CM

## 2023-10-01 DIAGNOSIS — C61 Malignant neoplasm of prostate: Secondary | ICD-10-CM

## 2023-10-01 DIAGNOSIS — I25110 Atherosclerotic heart disease of native coronary artery with unstable angina pectoris: Secondary | ICD-10-CM | POA: Diagnosis not present

## 2023-10-01 DIAGNOSIS — C3490 Malignant neoplasm of unspecified part of unspecified bronchus or lung: Secondary | ICD-10-CM

## 2023-10-01 DIAGNOSIS — K7460 Unspecified cirrhosis of liver: Secondary | ICD-10-CM

## 2023-10-01 LAB — CBC WITH DIFFERENTIAL/PLATELET
Basophils Absolute: 0 10*3/uL (ref 0.0–0.1)
Basophils Relative: 0.2 % (ref 0.0–3.0)
Eosinophils Absolute: 0.3 10*3/uL (ref 0.0–0.7)
Eosinophils Relative: 6.6 % — ABNORMAL HIGH (ref 0.0–5.0)
HCT: 36.2 % — ABNORMAL LOW (ref 39.0–52.0)
Hemoglobin: 12.3 g/dL — ABNORMAL LOW (ref 13.0–17.0)
Lymphocytes Relative: 28.8 % (ref 12.0–46.0)
Lymphs Abs: 1.3 10*3/uL (ref 0.7–4.0)
MCHC: 33.9 g/dL (ref 30.0–36.0)
MCV: 103.6 fl — ABNORMAL HIGH (ref 78.0–100.0)
Monocytes Absolute: 0.7 10*3/uL (ref 0.1–1.0)
Monocytes Relative: 14.1 % — ABNORMAL HIGH (ref 3.0–12.0)
Neutro Abs: 2.4 10*3/uL (ref 1.4–7.7)
Neutrophils Relative %: 50.3 % (ref 43.0–77.0)
Platelets: 173 10*3/uL (ref 150.0–400.0)
RBC: 3.49 Mil/uL — ABNORMAL LOW (ref 4.22–5.81)
RDW: 14.2 % (ref 11.5–15.5)
WBC: 4.7 10*3/uL (ref 4.0–10.5)

## 2023-10-01 LAB — PROTIME-INR
INR: 1.2 ratio — ABNORMAL HIGH (ref 0.8–1.0)
Prothrombin Time: 12.3 s (ref 9.6–13.1)

## 2023-10-01 LAB — IBC + FERRITIN
Ferritin: 115 ng/mL (ref 22.0–322.0)
Iron: 129 ug/dL (ref 42–165)
Saturation Ratios: 33.3 % (ref 20.0–50.0)
TIBC: 387.8 ug/dL (ref 250.0–450.0)
Transferrin: 277 mg/dL (ref 212.0–360.0)

## 2023-10-01 LAB — COMPREHENSIVE METABOLIC PANEL WITH GFR
ALT: 15 U/L (ref 0–53)
AST: 25 U/L (ref 0–37)
Albumin: 3.7 g/dL (ref 3.5–5.2)
Alkaline Phosphatase: 140 U/L — ABNORMAL HIGH (ref 39–117)
BUN: 10 mg/dL (ref 6–23)
CO2: 27 meq/L (ref 19–32)
Calcium: 9.3 mg/dL (ref 8.4–10.5)
Chloride: 106 meq/L (ref 96–112)
Creatinine, Ser: 0.87 mg/dL (ref 0.40–1.50)
GFR: 82.91 mL/min (ref >60.00)
Glucose, Bld: 89 mg/dL (ref 70–99)
Potassium: 3.4 meq/L — ABNORMAL LOW (ref 3.5–5.1)
Sodium: 141 meq/L (ref 135–145)
Total Bilirubin: 0.9 mg/dL (ref 0.2–1.2)
Total Protein: 6.4 g/dL (ref 6.0–8.3)

## 2023-10-01 LAB — TSH: TSH: 1.72 u[IU]/mL (ref 0.35–5.50)

## 2023-10-01 MED ORDER — NA SULFATE-K SULFATE-MG SULF 17.5-3.13-1.6 GM/177ML PO SOLN: 1.0000 | Freq: Once | ORAL | 0 refills | Status: AC

## 2023-10-01 NOTE — Progress Notes (Addendum)
 Chief Complaint: recall colonoscopy Primary GI MD: Dr. Adela Lank  HPI: 78 year old male with PMH of CAD/STEMI s/p PCI (08/2022), hyperlipidemia, metastatic poorly differentiated neuroendocrine carcinoma and prostate adenocarcinoma, pancytopenia due to chemotherapy, prostate CA, prediabetes, elevated LFTs   He was diagnosed with metastatic poorly differentiated neuroendocrine carcinoma in January 2024 and underwent chemotherapy with carboplatin, etoposide, and Imfinzi for four cycles, followed by Imfinzi alone for three cycles. Treatment was halted due to elevated liver enzymes, which are now improving, with AST decreasing from 132 to 34 and ALT from 108 to 51. Recent chest imaging shows stable disease with no significant changes.   Diagnosed with prostate adenocarcinoma February 2024.  March 2024 he with prostate adenocarcinoma February 2024 scheduled fori presented with chest pain and shortness of breath [facility] where EKG showed inferior ST cardiac catheterization showed chronic occlusion RCA with received 4 overlapping drug-eluting stents.  Echocardiogram March 2024 with LVEF 65 to 70% mild aortic valve stenosis.  He had a reassuring cardiac PET/CT 11/2022.  He is on Brilinta. Follows with Dr. Kirke Corin  Chronically elevated LFTS 03/2023: AST 55/ALT 143/ alk phos 132/T. bili 1.8 05/2023 AST 271/ ALT 236/ alk phos 101/ T. Bili 1.3 07/23/23: AST 132/ ALT 108/ Alk phos 137/ T bili 1.1 07/29/2023: AST 34/ ALT 51/ Alk phos 196/ T bili 1.1. CBC with hgb 12.8, MCV 102.7, platelets 142  CT abdomen pelvis with contrast 05/2023 showed nodular hepatic contour suggesting cirrhosis.  Left bladder wall thickening  (cystoscopy recommended), sclerotic osseous mets.  Scheduled for repeat CT 10/19/2023. First mention of nodular liver noted on CT 01/2023. No mention on CT 08/2022  Patient denies alcohol use.  Denies family history of liver disease.  States he is very concerned about his newfound diagnosis of  cirrhosis and would like referral to hepatology.  States he has been fatigued since he noted his LFTs were elevated.  Denies change in bowel habits, weight loss, nausea, vomiting.  Denies any episodes of jaundice, hepatic encephalopathy, ascites.  Reports intermittent peripheral edema controlled with furosemide via cardiology when he eats too much salt.  He was given steroids intermittently for his elevated LFTs which helped bring them down but he has not needed them in a while.  He has not had a workup for his elevated LFTs yet.    PREVIOUS GI WORKUP   Colonoscopy 08/2019 - One 4 mm polyp in the cecum, removed with a cold snare. Resected and retrieved.  - Four 3 to 4 mm polyps in the transverse colon, removed with a cold snare. Resected and retrieved.  - Diverticulosis- Diverticulosis in the sigmoid colon.  - Internal hemorrhoids. - The examination was otherwise normal. - repeat 3 years (08/2022)  Diagnosis Surgical [P], colon, cecum, transverse, polyp (5) - TUBULAR ADENOMA (X5 FRAGMENTS). - NO HIGH GRADE DYSPLASIA OR MALIGNANCY.  Past Medical History:  Diagnosis Date   Allergy    Arthritis    both knees    BCC (basal cell carcinoma), arm, right 09/2019   MOHS ([place])   BCC (basal cell carcinoma), face 2014   L preauricular s/p MOHS   BCC (basal cell carcinoma), face 02/2018   L upper lip   BPH (benign prostatic hypertrophy)    Dr. Mena Goes @ [facility]   Carotid stenosis 05/2015   RICA 40-59%, LICA 1-39%, L vertebral occlusion, rpt 1 yr   Cataract    removed both eyes    Diabetes mellitus without complication (HCC)    FHx: colon cancer  FHx: prostate cancer    Heart murmur    mild aortic stenosis    History of kidney stones    Hyperlipidemia    borderline- on rosuvastatin now normal    Occlusion of right vertebral artery 05/2015    Past Surgical History:  Procedure Laterality Date   BASAL CELL CARCINOMA EXCISION  05/2018   lip, 2020 x3  basal cells removed     CATARACT EXTRACTION  2008   Left   CATARACT EXTRACTION  2013   Right ([place]ppes)   COLONOSCOPY  ~2010   medium int hemorrhoids, o/w WNL, rpt 5 yrs given fmhx (Dr. Randa Evens)   COLONOSCOPY  08/2019   TAs, diverticulosis, rpt 3 yrs ([place])   CORONARY/GRAFT ACUTE MI REVASCULARIZATION N/A 09/07/2022   Procedure: Coronary/Graft Acute MI Revascularization;  Surgeon: Orbie Pyo, MD;  Location: [facility];  Service: Cardiovascular;  Laterality: N/A;   ENDARTERECTOMY Right 12/01/2019   Procedure: RIGHT ENDARTERECTOMY CAROTID;  Surgeon: Cephus Shelling, MD;  Location: [facility] OR;  Service: Vascular;  Laterality: Right;   exercise treadmill  11/2005   WNL Eldridge Dace)   FINE NEEDLE ASPIRATION  07/28/2022   Procedure: FINE NEEDLE ASPIRATION (FNA) LINEAR;  Surgeon: Josephine Igo, DO;  Location: [facility];  Service: Pulmonary;;   HERNIA REPAIR  2008   Right   IR IMAGING GUIDED PORT INSERTION  08/11/2022   IR IMAGING GUIDED PORT INSERTION  10/08/2022   IR REMOVAL TUN ACCESS W/ PORT W/O FL MOD SED  09/10/2022   LEFT HEART CATH AND CORONARY ANGIOGRAPHY N/A 09/07/2022   Procedure: LEFT HEART CATH AND CORONARY ANGIOGRAPHY;  Surgeon: Orbie Pyo, MD;  Location: [facility];  Service: Cardiovascular;  Laterality: N/A;   MOHS SURGERY Left spring 2014   basal cell face   PATCH ANGIOPLASTY Right 12/01/2019   Procedure: PATCH ANGIOPLASTY USING Livia Snellen BIOLOGIC PATCH;  Surgeon: Cephus Shelling, MD;  Location[facility];  Service: Vascular;  Laterality: Right;   Testicular Biopsy  2003   benign, varicocele   US ECHOCARDIOGRAPHY  11/2005   aortic sclerosis, EF 55-60%, diastolic dysfunction   VIDEO BRONCHOSCOPY WITH ENDOBRONCHIAL ULTRASOUND Bilateral 07/28/2022   Procedure: VIDEO BRONCHOSCOPY WITH ENDOBRONCHIAL ULTRASOUND;  Surgeon: Josephine Igo, DO;  Location: [facility];  Service: Pulmonary;  Laterality: Bilateral;    Current Outpatient Medications  Medication Sig Dispense Refill    Accu-Chek Softclix Lancets lancets SMARTSIG:Topical     acetaminophen (TYLENOL) 500 MG tablet Take 1,000 mg by mouth every 6 (six) hours as needed for mild pain or headache.     aspirin EC 81 MG tablet Take 1 tablet (81 mg total) by mouth daily. Swallow whole.     Blood Glucose Monitoring Suppl DEVI 1 each by Does not apply route in the morning, at noon, and at bedtime. E11.69. May substitute to any manufacturer covered by patient's insurance. 1 each 0   Calcium Carb-Cholecalciferol (RA CALCIUM PLUS VITAMIN D) 600-10 MG-MCG TABS Take 600 mg by mouth daily.     clopidogrel (PLAVIX) 75 MG tablet Take 1 tablet (75 mg total) by mouth daily. 90 tablet 1   fluticasone (FLONASE) 50 MCG/ACT nasal spray Place 1 spray into both nostrils daily as needed for allergies or rhinitis.     glipiZIDE (GLUCOTROL) 5 MG tablet TAKE 1 TABLET (5 MG) BY MOUTH DAILY WITH BREAKFAST. AS NEEDED FOR HIGH SUGARS WITH PREDNISONE USE 90 tablet 1   lidocaine-prilocaine (EMLA) cream Apply to the Port-A-Cath site 30-60  minutes before chemotherapy treatment (Patient taking differently: Apply 1 Application topically See admin instructions. Apply to the Port-A-Cath site 30-60 minutes before chemotherapy treatment) 30 g 0   metoprolol tartrate (LOPRESSOR) 25 MG tablet Take 0.5 tablets (12.5 mg total) by mouth 2 (two) times daily. 90 tablet 0   Multiple Vitamin (MULTIVITAMIN) tablet Take 1 tablet by mouth daily with breakfast.     Na Sulfate-K Sulfate-Mg Sulfate concentrate (SUPREP) 17.5-3.13-1.6 GM/177ML SOLN Take 1 kit (354 mLs total) by mouth once for 1 dose. 354 mL 0   OVER THE COUNTER MEDICATION Xtendi darolomipe pt taking 250 mg     pantoprazole (PROTONIX) 20 MG tablet TAKE 1 TABLET BY MOUTH EVERY DAY 90 tablet 1   prochlorperazine (COMPAZINE) 10 MG tablet Take 1 tablet (10 mg total) by mouth every 6 (six) hours as needed for nausea or vomiting. 30 tablet 0   rosuvastatin (CRESTOR) 5 MG tablet Take 1 tablet (5 mg total) by mouth  daily. 90 tablet 1   tamsulosin (FLOMAX) 0.4 MG CAPS capsule Take 0.4 mg by mouth 2 (two) times daily.     Zinc 25 MG TABS Take 25 mg by mouth daily.     abiraterone acetate (ZYTIGA) 250 MG tablet Take 1,000 mg by mouth daily.     furosemide (LASIX) 20 MG tablet Take 1 tablet (20 mg total) by mouth as needed (take if increase in weight  of 2-3 lbs overnight or 8 lbs in a week). 90 tablet 3   potassium chloride (KLOR-CON) 10 MEQ tablet Take 1 tablet (10 mEq total) by mouth as needed. 90 tablet 3   predniSONE (DELTASONE) 5 MG tablet Take 1 tablet (5 mg total) by mouth 2 (two) times daily with a meal.     No current facility-administered medications for this visit.    Allergies as of 10/01/2023   (No Known Allergies)    Family History  Problem Relation Age of Onset   Cancer Father 54       prostate   Coronary artery disease Father 56       MI   Prostate cancer Father    Cancer Paternal Grandfather 67       prostate   Prostate cancer Paternal Grandfather    Stroke Mother    Hypertension Mother    Cancer Mother 59       colon   Colon cancer Mother    Coronary artery disease Maternal Uncle    Cancer Sister 66       breast   Breast cancer Sister    Colon cancer Maternal Grandmother    Diabetes Neg Hx    Colon polyps Neg Hx    Esophageal cancer Neg Hx    Rectal cancer Neg Hx    Stomach cancer Neg Hx     Social History   Socioeconomic History   Marital status: Married    Spouse name: Not on file   Number of children: Not on file   Years of education: Not on file   Highest education level: Not on file  Occupational History   Occupation: retired  Tobacco Use   Smoking status: Never   Smokeless tobacco: Never  Vaping Use   Vaping status: Never Used  Substance and Sexual Activity   Alcohol use: Not Currently    Comment: rare   Drug use: No   Sexual activity: Not on file  Other Topics Concern   Not on file  Social History Narrative   Caffeine: 2-3  cups coffee/day    Lives with wife and adult son, 1 dog   Occupation: Warden/ranger   Edu: college   Activity: golf, no regular exercise, occasionally walks with wife   Diet: good amt water, daily fruits/vegetables, fish several times a week   Social Drivers of Corporate investment banker Strain: Patient Declined (06/28/2023)   Overall Financial Resource Strain (CARDIA)    Difficulty of Paying Living Expenses: Patient declined  Food Insecurity: No Food Insecurity (06/28/2023)   Hunger Vital Sign    Worried About Running Out of Food in the Last Year: Never true    Ran Out of Food in the Last Year: Never true  Transportation Needs: No Transportation Needs (10/06/2022)   PRAPARE - Administrator, Civil Service (Medical): No    Lack of Transportation (Non-Medical): No  Physical Activity: Inactive (10/06/2022)   Exercise Vital Sign    Days of Exercise per Week: 0 days    Minutes of Exercise per Session: 0 min  Stress: No Stress Concern Present (10/06/2022)   [employer] of Occupational Health - Occupational Stress Questionnaire    Feeling of Stress : Not at all  Social Connections: Moderately Integrated (06/28/2023)   Social Connection and Isolation Panel [NHANES]    Frequency of Communication with Friends and Family: More than three times a week    Frequency of Social Gatherings with Friends and Family: Three times a week    Attends Religious Services: More than 4 times per year    Active Member of Clubs or Organizations: No    Attends Banker Meetings: Never    Marital Status: Married  Catering manager Violence: Not At Risk (10/06/2022)   Humiliation, Afraid, Rape, and Kick questionnaire    Fear of Current or Ex-Partner: No    Emotionally Abused: No    Physically Abused: No    Sexually Abused: No    Review of Systems:    Constitutional: No weight loss, fever, chills, weakness or fatigue HEENT: Eyes: No change in vision               Ears, Nose, Throat:  No change  in hearing or congestion Skin: No rash or itching Cardiovascular: No chest pain, chest pressure or palpitations   Respiratory: No SOB or cough Gastrointestinal: See HPI and otherwise negative Genitourinary: No dysuria or change in urinary frequency Neurological: No headache, dizziness or syncope Musculoskeletal: No new muscle or joint pain Hematologic: No bleeding or bruising Psychiatric: No history of depression or anxiety    Physical Exam:  Vital signs: BP 104/60   Pulse 68   Ht 5\' 8"  (1.727 m)   Wt 199 lb (90.3 kg)   BMI 30.26 kg/m   Constitutional: NAD, Well developed, Well nourished, alert and cooperative. Appears in better health than chart depicts Head:  Normocephalic and atraumatic. Eyes:   PEERL, EOMI. No icterus. Conjunctiva pink. Respiratory: Respirations even and unlabored. Lungs clear to auscultation bilaterally.   No wheezes, crackles, or rhonchi.  Cardiovascular:  Regular rate and rhythm. No peripheral edema, cyanosis or pallor.  Gastrointestinal:  Soft, nondistended, nontender. No rebound or guarding. Normal bowel sounds. No appreciable masses or hepatomegaly. Rectal:  Not performed.  Msk:  Symmetrical without gross deformities. Without edema, no deformity or joint abnormality.  Neurologic:  Alert and  oriented x4;  grossly normal neurologically.  Skin:   Dry and intact without significant lesions or rashes. Psychiatric: Oriented to person, place and time. Demonstrates  good judgement and reason without abnormal affect or behaviors.   RELEVANT LABS AND IMAGING: CBC    Component Value Date/Time   WBC 8.6 07/29/2023 1007   WBC 5.9 12/22/2022 0811   RBC 3.66 (L) 07/29/2023 1007   HGB 12.8 (L) 07/29/2023 1007   HCT 37.6 (L) 07/29/2023 1007   PLT 142 (L) 07/29/2023 1007   MCV 102.7 (H) 07/29/2023 1007   MCH 35.0 (H) 07/29/2023 1007   MCHC 34.0 07/29/2023 1007   RDW 13.6 07/29/2023 1007   LYMPHSABS 1.4 07/29/2023 1007   MONOABS 1.0 07/29/2023 1007   EOSABS  0.1 07/29/2023 1007   BASOSABS 0.0 07/29/2023 1007    CMP     Component Value Date/Time   NA 138 07/29/2023 1007   NA 140 05/31/2023 1417   K 4.6 07/29/2023 1007   CL 106 07/29/2023 1007   CO2 28 07/29/2023 1007   GLUCOSE 138 (H) 07/29/2023 1007   BUN 21 07/29/2023 1007   BUN 14 05/31/2023 1417   CREATININE 0.91 07/29/2023 1007   CALCIUM 9.2 07/29/2023 1007   PROT 6.4 (L) 07/29/2023 1007   PROT 6.5 09/12/2018 1118   ALBUMIN 3.7 07/29/2023 1007   ALBUMIN 4.0 09/12/2018 1118   AST 34 07/29/2023 1007   ALT 51 (H) 07/29/2023 1007   ALKPHOS 196 (H) 07/29/2023 1007   BILITOT 1.1 07/29/2023 1007   GFRNONAA >60 07/29/2023 1007   GFRAA >60 12/02/2019 1610     Assessment/Plan:   78 year old male with PMH of CAD/STEMI s/p PCI (08/2022), hyperlipidemia, poorly differentiated neuroendocrine carcinoma and prostate adenocarcinoma, pancytopenia due to chemotherapy, prediabetes, elevated LFTs   Elevated LFTs Cirrhosis Elevated LFTs over the last year.  No previous liver workup.  Denies alcohol use, family history of liver disease.  Recent CT scan noted cirrhosis without any focal lesions.  No previous history of fatty liver.  He does have pancytopenia thought to be in the setting of chemotherapy though could also be in the setting of cirrhosis. No history of decompensation.  No MELD score but will calculate -- Extensive serologic workup for acute on chronic liver disease including: Ceruloplasmin, alpha-1 antitrypsin, AMA, ASMA, ANA, IgG, IgA, TTG, serum copper - CBC, CMP, IBC plus ferritin, PT/INR, TSH - Hepatitis A antibody, hepatitis B surface antigen, hepatitis B surface antibody, hepatitis C antibody -- Management for the above findings.  No evidence of portal hypertension noted on recent CT scan.  No evidence of decompensation. -- extensive discussion with patient about cirrhosis, education as well as 2g sodium diet provided patient education handouts as well  History of colon polyps 5  tubular adenomas on colonoscopy in 2021 with recall of 3 years.  Recent CAD/STEMI s/p PCI February 2024.  Adequate ejection fraction on recent echo with mild aortic stenosis. Patient is anxious about his health issues and being diagnosed with multiple cancers as well as recent diagnosis of cirrhosis (and the higher risk of developing HCC with that diagnosis). He would like to proceed with colonoscopy. He is low risk for colon cancer with no high risk lesions on last exam. - Schedule colonoscopy in the [facility] - Will hold Plavix - I thoroughly discussed the procedure with the patient (at bedside) to include nature of the procedure, alternatives, benefits, and risks (including but not limited to bleeding, infection, perforation, anesthesia/cardiac pulmonary complications).  Patient verbalized understanding and gave verbal consent to proceed with procedure.  Metastatic poorly differentiated carcinoma, neuroendocrine carcinoma of suspicious prostate primary versus primary lung cancer  with mediastinal lymphadenopathy and mets to several areas of bone diagnosed January 2024, prostate adenocarcinoma diagnosed February 2024 Follows with Dr. Shirline Frees.  On systemic chemotherapy. Due for repeat CT 10/19/2023  Lara Mulch [facility] 10/01/2023, 3:46 PM  Cc: [name], MD

## 2023-10-01 NOTE — Telephone Encounter (Signed)
 Dr. Kirke Corin,  Patient has a history of 4 overlapping DES to RCA placed in March 2024. Per office protocol, will you provide recommendations for holding Plavix prior to colonoscopy? Patient will remain on aspirin throughout peri-procedural period.   Please route your response to [facility] Preop. I will communicate with requesting office once you have given recommendations.   Thank you!  Carlos Levering, NP

## 2023-10-01 NOTE — Telephone Encounter (Signed)
 Error

## 2023-10-01 NOTE — Patient Instructions (Signed)
 Your provider has requested that you go to the basement level for lab work before leaving today. Press "B" on the elevator. The lab is located at the first door on the left as you exit the elevator.  You have been scheduled for a colonoscopy. Please follow written instructions given to you at your visit today.   If you use inhalers (even only as needed), please bring them with you on the day of your procedure.  DO NOT TAKE 7 DAYS PRIOR TO TEST- Trulicity (dulaglutide) Ozempic, Wegovy (semaglutide) Mounjaro (tirzepatide) Bydureon Bcise (exanatide extended release)  DO NOT TAKE 1 DAY PRIOR TO YOUR TEST Rybelsus (semaglutide) Adlyxin (lixisenatide) Victoza (liraglutide) Byetta (exanatide) ___________________________________________________________________________  Due to recent changes in healthcare laws, you may see the results of your imaging and laboratory studies on MyChart before your provider has had a chance to review them.  We understand that in some cases there may be results that are confusing or concerning to you. Not all laboratory results come back in the same time frame and the provider may be waiting for multiple results in order to interpret others.  Please give [place] 48 hours in order for your provider to thoroughly review all the results before contacting the office for clarification of your results.   You will be contacted by our office prior to your procedure for directions on holding your Plavix.  If you do not hear from our office 1 week prior to your scheduled procedure, please call [phone] to discuss.  _______________________________________________________  If your blood pressure at your visit was 140/90 or greater, please contact your primary care physician to follow up on this.  _______________________________________________________  If you are age 85 or older, your body mass index should be between 23-30. Your Body mass index is 30.26 kg/m. If this is out of  the aforementioned range listed, please consider follow up with your Primary Care Provider.  If you are age 49 or younger, your body mass index should be between 19-25. Your Body mass index is 30.26 kg/m. If this is out of the aformentioned range listed, please consider follow up with your Primary Care Provider.   ________________________________________________________  The  GI providers would like to encourage you to use [facility] to communicate with providers for non-urgent requests or questions.  Due to long hold times on the telephone, sending your provider a message by [facility] may be a faster and more efficient way to get a response.  Please allow 48 business hours for a response.  Please remember that this is for non-urgent requests.  _______________________________________________________  Thank you for trusting me with your gastrointestinal care!   Boone Master, PA

## 2023-10-01 NOTE — Telephone Encounter (Signed)
 [facility] Pre-operative Risk Assessment     Request for surgical clearance:     Endoscopy Procedure  What type of surgery is being performed?     Colonoscopy  When is this surgery scheduled?     11/02/23  What type of clearance is required ?   Pharmacy  Are there any medications that need to be held prior to surgery and how long? Plavix x5 days  Practice name and name of physician performing surgery?      [facility]  What is your office phone and fax number?      Phone- [phone]  Fax- [phone]  Anesthesia type (None, local, MAC, general) ?       MAC   Please route your response to Charlies Silvers, CMA

## 2023-10-04 NOTE — Progress Notes (Signed)
 Agree with assessment as outlined with the following thoughts.  This is not a patient I would typically recommend surveillance colonoscopy for with metastatic malignancy and no high risk lesions on his last colonoscopy.  I have discussed his case with Bayley after this visit.  Sounds like he is quite functional and otherwise feeling well and is rather anxious about this and strongly wanted to have a colonoscopy for peace of mind.  If that is the case and he is having anxiety about not having any exam and strongly wishes to have a colonoscopy we can offer it, but again I would not normally recommend an exam under the circumstances given his other medical problems.  As long as he understands risks, if he strongly wants to proceed for peace of mind we can do that.

## 2023-10-05 ENCOUNTER — Telehealth: Payer: Self-pay | Admitting: *Deleted

## 2023-10-05 LAB — COPPER, SERUM: Copper: 121 ug/dL (ref 70–175)

## 2023-10-05 LAB — ANTI-NUCLEAR AB-TITER (ANA TITER): ANA Titer 1: 1:40 {titer} — ABNORMAL HIGH

## 2023-10-05 LAB — ANTI-SMOOTH MUSCLE ANTIBODY, IGG: Actin (Smooth Muscle) Antibody (IGG): <20 U (ref <20)

## 2023-10-05 LAB — CERULOPLASMIN: Ceruloplasmin: 27 mg/dL (ref 14–30)

## 2023-10-05 LAB — TISSUE TRANSGLUTAMINASE, IGA: (tTG) Ab, IgA: <1 U/mL

## 2023-10-05 LAB — ALPHA-1-ANTITRYPSIN: A-1 Antitrypsin, Ser: 165 mg/dL (ref 83–199)

## 2023-10-05 LAB — IGA: Immunoglobulin A: 196 mg/dL (ref 70–320)

## 2023-10-05 LAB — ANA: Anti Nuclear Antibody (ANA): POSITIVE — AB

## 2023-10-05 LAB — MITOCHONDRIAL ANTIBODIES: Mitochondrial M2 Ab, IgG: <=20 U (ref <=20.0)

## 2023-10-05 LAB — HEPATITIS B SURFACE ANTIGEN: Hepatitis B Surface Ag: NONREACTIVE

## 2023-10-05 LAB — HEPATITIS A ANTIBODY, TOTAL: Hepatitis A AB,Total: REACTIVE — AB

## 2023-10-05 LAB — HEPATITIS C ANTIBODY: Hepatitis C Ab: NONREACTIVE

## 2023-10-05 LAB — IGG: IgG (Immunoglobin G), Serum: 1043 mg/dL (ref 600–1540)

## 2023-10-05 LAB — HEPATITIS B SURFACE ANTIBODY,QUALITATIVE: Hep B S Ab: NONREACTIVE

## 2023-10-05 NOTE — Telephone Encounter (Signed)
 Hold Plavix 5 days before.

## 2023-10-05 NOTE — Telephone Encounter (Signed)
 Pt has been scheduled tele preop appt 10/22/23. Med rec and consent are done.

## 2023-10-05 NOTE — Telephone Encounter (Signed)
   Name: Larry Kent  DOB: 05/23/1946  MRN: [id number]  Primary Cardiologist: None   Preoperative team, please contact this patient and set up a phone call appointment for further preoperative risk assessment. Please obtain consent and complete medication review. Thank you for your help.  I confirm that guidance regarding antiplatelet and oral anticoagulation therapy has been completed and, if necessary, noted below.  Per office protocol, if patient is without any new symptoms or concerns at the time of their virtual visit, he may hold Plavix for 5 days prior to procedure. Please resume Plavix as soon as possible postprocedure, at the discretion of the surgeon. Patient should remain on aspirin throughout peri-procedural period.   I also confirmed the patient resides in the state of [place]. As per [facility] Medical Board telemedicine laws, the patient must reside in the state in which the provider is licensed.   Denyce Robert, NP 10/05/2023, 1:33 PM [facility]

## 2023-10-05 NOTE — Telephone Encounter (Signed)
 Pt has been scheduled tele preop appt 10/22/23. Med rec and consent are done.     Patient Consent for Virtual Visit        Larry Kent has provided verbal consent on 10/05/2023 for a virtual visit (video or telephone).   CONSENT FOR VIRTUAL VISIT FOR:  Larry Kent  By participating in this virtual visit I agree to the following:  I hereby voluntarily request, consent and authorize [facility] and its employed or contracted physicians, physician assistants, nurse practitioners or other licensed health care professionals (the Practitioner), to provide me with telemedicine health care services (the "Services") as deemed necessary by the treating Practitioner. I acknowledge and consent to receive the Services by the Practitioner via telemedicine. I understand that the telemedicine visit will involve communicating with the Practitioner through live audiovisual communication technology and the disclosure of certain medical information by electronic transmission. I acknowledge that I have been given the opportunity to request an in-person assessment or other available alternative prior to the telemedicine visit and am voluntarily participating in the telemedicine visit.  I understand that I have the right to withhold or withdraw my consent to the use of telemedicine in the course of my care at any time, without affecting my right to future care or treatment, and that the Practitioner or I may terminate the telemedicine visit at any time. I understand that I have the right to inspect all information obtained and/or recorded in the course of the telemedicine visit and may receive copies of available information for a reasonable fee.  I understand that some of the potential risks of receiving the Services via telemedicine include:  Delay or interruption in medical evaluation due to technological equipment failure or disruption; Information transmitted may not be sufficient (e.g. poor  resolution of images) to allow for appropriate medical decision making by the Practitioner; and/or  In rare instances, security protocols could fail, causing a breach of personal health information.  Furthermore, I acknowledge that it is my responsibility to provide information about my medical history, conditions and care that is complete and accurate to the best of my ability. I acknowledge that Practitioner's advice, recommendations, and/or decision may be based on factors not within their control, such as incomplete or inaccurate data provided by me or distortions of diagnostic images or specimens that may result from electronic transmissions. I understand that the practice of medicine is not an exact science and that Practitioner makes no warranties or guarantees regarding treatment outcomes. I acknowledge that a copy of this consent can be made available to me via my patient portal [facility] MyChart), or I can request a printed copy by calling the office of [facility].    I understand that my insurance will be billed for this visit.   I have read or had this consent read to me. I understand the contents of this consent, which adequately explains the benefits and risks of the Services being provided via telemedicine.  I have been provided ample opportunity to ask questions regarding this consent and the Services and have had my questions answered to my satisfaction. I give my informed consent for the services to be provided through the use of telemedicine in my medical care

## 2023-10-07 ENCOUNTER — Ambulatory Visit: Payer: Medicare Other

## 2023-10-07 VITALS — Ht 68.0 in | Wt 196.0 lb

## 2023-10-07 DIAGNOSIS — Z0000 Encounter for general adult medical examination without abnormal findings: Secondary | ICD-10-CM

## 2023-10-07 NOTE — Patient Instructions (Signed)
 [name] , Thank you for taking time to come for your Medicare Wellness Visit. I appreciate your ongoing commitment to your health goals. Please review the following plan we discussed and let me know if I can assist you in the future.   Referrals/Orders/Follow-Ups/Clinician Recommendations: none  This is a list of the screening recommended for you and due dates:  Health Maintenance  Topic Date Due   Eye exam for diabetics  Never done   Zoster (Shingles) Vaccine (1 of 2) Never done   DTaP/Tdap/Td vaccine (2 - Tdap) 02/11/2022   COVID-19 Vaccine (4 - 2024-25 season) 02/28/2023   Flu Shot  01/28/2024   Hemoglobin A1C  03/31/2024   Complete foot exam   06/28/2024   Yearly kidney health urinalysis for diabetes  09/29/2024   Yearly kidney function blood test for diabetes  09/30/2024   Medicare Annual Wellness Visit  10/06/2024   Pneumonia Vaccine  Completed   Hepatitis C Screening  Completed   HPV Vaccine  Aged Out   Meningitis B Vaccine  Aged Out   Colon Cancer Screening  Discontinued   Cologuard (Stool DNA test)  Discontinued    Advanced directives: (Copy Requested) Please bring a copy of your health care power of attorney and living will to the office to be added to your chart at your convenience. You can mail to [facility] [address] [place], [place] [address] or email to [email or web address]  Next Medicare Annual Wellness Visit scheduled for next year: Yes 10/09/24 @ 10:10am televisit

## 2023-10-07 NOTE — Progress Notes (Addendum)
 Please attest and cosign this visit due to patients primary care provider not being in the office at the time the visit was completed.    Subjective:   Larry Kent is a 78 y.o. who presents for a Medicare Wellness preventive visit.  Visit Complete: Virtual I connected with  Larry Kent on 10/07/23 by a audio enabled telemedicine application and verified that I am speaking with the correct person using two identifiers.  Patient Location: Home  Provider Location: Home Office  I discussed the limitations of evaluation and management by telemedicine. The patient expressed understanding and agreed to proceed.  Vital Signs: Because this visit was a virtual/telehealth visit, some criteria may be missing or patient reported. Any vitals not documented were not able to be obtained and vitals that have been documented are patient reported.  VideoDeclined- This patient declined Librarian, academic. Therefore the visit was completed with audio only.  Persons Participating in Visit: Patient.  AWV Questionnaire: No: Patient Medicare AWV questionnaire was not completed prior to this visit.  Cardiac Risk Factors include: advanced age (>40men, >54 women);dyslipidemia;male gender     Objective:    Today's Vitals   10/07/23 1012  Weight: 196 lb (88.9 kg)  Height: 5\' 8"  (1.727 m)   Body mass index is 29.8 kg/m.     10/07/2023   10:29 AM 08/16/2023    3:36 PM 11/25/2022   10:08 AM 10/29/2022    6:48 PM 10/08/2022   12:05 PM 10/06/2022    9:48 AM 09/21/2022   11:19 AM  Advanced Directives  Does Patient Have a Medical Advance Directive? Yes Yes Yes Yes Yes Yes Yes  Type of Estate agent of [place];Living will  Healthcare Power of [employer] Power of [employer] Power of [employer] Power of Attorney   Does patient want to make changes to medical advance directive?   No - Patient declined No - Patient declined No -  Patient declined No - Patient declined   Copy of Healthcare Power of Attorney in Chart? No - copy requested    No - copy requested No - copy requested     Current Medications (verified) Outpatient Encounter Medications as of 10/07/2023  Medication Sig   Accu-Chek Softclix Lancets lancets SMARTSIG:Topical   acetaminophen (TYLENOL) 500 MG tablet Take 1,000 mg by mouth every 6 (six) hours as needed for mild pain or headache.   aspirin EC 81 MG tablet Take 1 tablet (81 mg total) by mouth daily. Swallow whole.   Blood Glucose Monitoring Suppl DEVI 1 each by Does not apply route in the morning, at noon, and at bedtime. E11.69. May substitute to any manufacturer covered by patient's insurance.   Calcium Carb-Cholecalciferol (RA CALCIUM PLUS VITAMIN D) 600-10 MG-MCG TABS Take 600 mg by mouth daily.   clopidogrel (PLAVIX) 75 MG tablet Take 1 tablet (75 mg total) by mouth daily.   fluticasone (FLONASE) 50 MCG/ACT nasal spray Place 1 spray into both nostrils daily as needed for allergies or rhinitis.   glipiZIDE (GLUCOTROL) 5 MG tablet TAKE 1 TABLET (5 MG) BY MOUTH DAILY WITH BREAKFAST. AS NEEDED FOR HIGH SUGARS WITH PREDNISONE USE (Patient taking differently: Take 5 mg by mouth daily before breakfast.)   lidocaine-prilocaine (EMLA) cream Apply to the Port-A-Cath site 30-60 minutes before chemotherapy treatment (Patient taking differently: Apply 1 Application topically See admin instructions. Apply to the Port-A-Cath site 30-60 minutes before chemotherapy treatment)   metoprolol tartrate (LOPRESSOR) 25 MG tablet  Take 0.5 tablets (12.5 mg total) by mouth 2 (two) times daily.   Multiple Vitamin (MULTIVITAMIN) tablet Take 1 tablet by mouth daily with breakfast.   NUBEQA 300 MG tablet Take 600 mg by mouth 2 (two) times daily.   pantoprazole (PROTONIX) 20 MG tablet TAKE 1 TABLET BY MOUTH EVERY DAY   rosuvastatin (CRESTOR) 5 MG tablet Take 1 tablet (5 mg total) by mouth daily.   tamsulosin (FLOMAX) 0.4 MG CAPS  capsule Take 0.4 mg by mouth 2 (two) times daily.   Zinc 25 MG TABS Take 25 mg by mouth daily.   abiraterone acetate (ZYTIGA) 250 MG tablet Take 1,000 mg by mouth daily.   furosemide (LASIX) 20 MG tablet Take 1 tablet (20 mg total) by mouth as needed (take if increase in weight  of 2-3 lbs overnight or 8 lbs in a week).   OVER THE COUNTER MEDICATION Xtendi darolomipe pt taking 250 mg (Patient not taking: Reported on 10/07/2023)   potassium chloride (KLOR-CON) 10 MEQ tablet Take 1 tablet (10 mEq total) by mouth as needed.   predniSONE (DELTASONE) 5 MG tablet Take 1 tablet (5 mg total) by mouth 2 (two) times daily with a meal.   prochlorperazine (COMPAZINE) 10 MG tablet Take 1 tablet (10 mg total) by mouth every 6 (six) hours as needed for nausea or vomiting. (Patient not taking: Reported on 10/07/2023)   No facility-administered encounter medications on file as of 10/07/2023.    Allergies (verified) Patient has no known allergies.   History: Past Medical History:  Diagnosis Date   Allergy    Arthritis    both knees    BCC (basal cell carcinoma), arm, right 09/2019   MOHS ([place]kov)   BCC (basal cell carcinoma), face 2014   L preauricular s/p MOHS   BCC (basal cell carcinoma), face 02/2018   L upper lip   BPH (benign prostatic hypertrophy)    Dr. Derrick Fling @ [facility]   Carotid stenosis 05/2015   RICA 40-59%, LICA 1-39%, L vertebral occlusion, rpt 1 yr   Cataract    removed both eyes    Diabetes mellitus without complication (HCC)    FHx: colon cancer    FHx: prostate cancer    Heart murmur    mild aortic stenosis    History of kidney stones    Hyperlipidemia    borderline- on rosuvastatin now normal    Occlusion of right vertebral artery 05/2015   Past Surgical History:  Procedure Laterality Date   BASAL CELL CARCINOMA EXCISION  05/2018   lip, 2020 x3  basal cells removed    CATARACT EXTRACTION  2008   Left   CATARACT EXTRACTION  2013   Right (Eppes)   COLONOSCOPY  ~2010    medium int hemorrhoids, o/w WNL, rpt 5 yrs given fmhx (Dr. Denece Finger)   COLONOSCOPY  08/2019   TAs, diverticulosis, rpt 3 yrs (Armbruster)   CORONARY/GRAFT ACUTE MI REVASCULARIZATION N/A 09/07/2022   Procedure: Coronary/Graft Acute MI Revascularization;  Surgeon: Kyra Phy, MD;  Location: [facility] INVAS[facility] LAB;  Service: Cardiovascular;  Laterality: N/A;   ENDARTERECTOMY Right 12/01/2019   Procedure: RIGHT ENDARTERECTOMY CAROTID;  Surgeon: Young Hensen, MD;  Location: [facility];  Service: Vascular;  Laterality: Right;   exercise treadmill  11/2005   WNL Jacquelynn Matter)   FINE NEEDLE ASPIRATION  07/28/2022   Procedure: FINE NEEDLE ASPIRATION (FNA) LINEAR;  Surgeon: Prudy Brownie, DO;  Location: [facility] ENDOSCOPY;  Service: Pulmonary;;   HERNIA REPAIR  2008   Right   IR IMAGING GUIDED PORT INSERTION  08/11/2022   IR IMAGING GUIDED PORT INSERTION  10/08/2022   IR REMOVAL TUN ACCESS W/ PORT W/O FL MOD SED  09/10/2022   LEFT HEART CATH AND CORONARY ANGIOGRAPHY N/A 09/07/2022   Procedure: LEFT HEART CATH AND CORONARY ANGIOGRAPHY;  Surgeon: Kyra Phy, MD;  Location: [facility];  Service: Cardiovascular;  Laterality: N/A;   MOHS SURGERY Left spring 2014   basal cell face   PATCH ANGIOPLASTY Right 12/01/2019   Procedure: PATCH ANGIOPLASTY USING Corinna Dickens BIOLOGIC PATCH;  Surgeon: Young Hensen, MD;  Location: [facility] OR;  Service: Vascular;  Laterality: Right;   Testicular Biopsy  2003   benign, varicocele   US  ECHOCARDIOGRAPHY  11/2005   aortic sclerosis, EF 55-60%, diastolic dysfunction   VIDEO BRONCHOSCOPY WITH ENDOBRONCHIAL ULTRASOUND Bilateral 07/28/2022   Procedure: VIDEO BRONCHOSCOPY WITH ENDOBRONCHIAL ULTRASOUND;  Surgeon: Prudy Brownie, DO;  Location: [facility] ENDOS[facility];  Service: Pulmonary;  Laterality: Bilateral;   Family History  Problem Relation Age of Onset   Cancer Father 53       prostate   Coronary artery disease Father 17       MI   Prostate cancer Father    Cancer  Paternal Grandfather 15       prostate   Prostate cancer Paternal Grandfather    Stroke Mother    Hypertension Mother    Cancer Mother 19       colon   Colon cancer Mother    Coronary artery disease Maternal Uncle    Cancer Sister 71       breast   Breast cancer Sister    Colon cancer Maternal Grandmother    Diabetes Neg Hx    Colon polyps Neg Hx    Esophageal cancer Neg Hx    Rectal cancer Neg Hx    Stomach cancer Neg Hx    Social History   Socioeconomic History   Marital status: Married    Spouse name: Not on file   Number of children: Not on file   Years of education: Not on file   Highest education level: Not on file  Occupational History   Occupation: retired  Tobacco Use   Smoking status: Never   Smokeless tobacco: Never  Vaping Use   Vaping status: Never Used  Substance and Sexual Activity   Alcohol use: Not Currently    Comment: rare   Drug use: No   Sexual activity: Not on file  Other Topics Concern   Not on file  Social History Narrative   Caffeine: 2-3 cups coffee/day   Lives with wife and adult son, 1 dog   Occupation: Warden/ranger   Edu: college   Activity: golf, no regular exercise, occasionally walks with wife   Diet: good amt water, daily fruits/vegetables, fish several times a week   Social Drivers of Corporate investment banker Strain: Low Risk  (10/07/2023)   Overall Financial Resource Strain (CARDIA)    Difficulty of Paying Living Expenses: Not hard at all  Food Insecurity: No Food Insecurity (10/07/2023)   Hunger Vital Sign    Worried About Running Out of Food in the Last Year: Never true    Ran Out of Food in the Last Year: Never true  Transportation Needs: No Transportation Needs (10/07/2023)   PRAPARE - Administrator, Civil Service (Medical): No    Lack of Transportation (Non-Medical): No  Physical  Activity: Insufficiently Active (10/07/2023)   Exercise Vital Sign    Days of Exercise per Week: 5 days    Minutes  of Exercise per Session: 20 min  Stress: No Stress Concern Present (10/07/2023)   [employer] of Occupational Health - Occupational Stress Questionnaire    Feeling of Stress : Not at all  Social Connections: Moderately Integrated (10/07/2023)   Social Connection and Isolation Panel [NHANES]    Frequency of Communication with Friends and Family: More than three times a week    Frequency of Social Gatherings with Friends and Family: Twice a week    Attends Religious Services: More than 4 times per year    Active Member of [employer] or Organizations: No    Attends Engineer, structural: Never    Marital Status: Married    Tobacco Counseling Counseling given: Not Answered    Clinical Intake:  Pre-visit preparation completed: Yes  Pain : No/denies pain     BMI - recorded: 29.8 Nutritional Status: BMI 25 -29 Overweight Nutritional Risks: None Diabetes: No  Lab Results  Component Value Date   HGBA1C 6.6 (A) 09/30/2023   HGBA1C 7.8 (A) 06/29/2023   HGBA1C 6.3 12/22/2022     How often do you need to have someone help you when you read instructions, pamphlets, or other written materials from your doctor or pharmacy?: 1 - Never  Interpreter Needed?: No  Comments: lives with wife and adult son Information entered by :: B.Firmin Belisle,LPN   Activities of Daily Living     10/07/2023   10:30 AM  In your present state of health, do you have any difficulty performing the following activities:  Hearing? 0  Vision? 0  Difficulty concentrating or making decisions? 0  Walking or climbing stairs? 0  Dressing or bathing? 0  Doing errands, shopping? 0  Preparing Food and eating ? N  Using the Toilet? N  In the past six months, have you accidently leaked urine? N  Do you have problems with loss of bowel control? N  Managing your Medications? N  Managing your Finances? N  Housekeeping or managing your Housekeeping? N    Patient Care Team: Claire Crick, MD as PCP -  General (Family Medicine) Marlene Simas, MD as Consulting Physician (Oncology) Ernie Heal, Jerelyn Money, MD as Consulting Physician (Infectious Diseases) Young Hensen, MD as Consulting Physician (Vascular Surgery) [facility] Wenona Hamilton, MD as Consulting Physician (Cardiology) [facility])  Indicate any recent Medical Services you may have received from other than [facility] providers in the past year (date may be approximate).     Assessment:   This is a routine wellness examination for [place].  Hearing/Vision screen Hearing Screening - Comments:: Pt says his hearing is good other than tinnitus Vision Screening - Comments:: Pt says his vision is good; lft 20/20 Dr Bernetta Brilliant [facility]    Goals Addressed             This Visit's Progress    COMPLETED: Have 3 meals a day         Depression Screen     10/07/2023   10:24 AM 08/16/2023    3:31 PM 06/29/2023   10:59 AM 03/24/2023   11:28 AM 12/01/2022    2:40 PM 10/06/2022    9:47 AM 09/28/2022    9:03 AM  PHQ 2/9 Scores  PHQ - 2 Score 0 0 0 0 0 0 0  PHQ- 9 Score   1 2 2  Fall Risk     10/07/2023   10:17 AM 08/16/2023    3:30 PM 06/29/2023   10:58 AM 11/25/2022   10:07 AM 10/06/2022    9:46 AM  Fall Risk   Falls in the past year? 0 0 0 0 0  Number falls in past yr: 0   0 0  Injury with Fall? 0   0 0  Risk for fall due to : No Fall Risks   Medication side effect No Fall Risks  Follow up Education provided;Falls prevention discussed   Falls evaluation completed;Education provided;Falls prevention discussed Falls prevention discussed;Education provided;Falls evaluation completed    MEDICARE RISK AT HOME:  Medicare Risk at Home Any stairs in or around the home?: Yes If so, are there any without handrails?: Yes Home free of loose throw rugs in walkways, pet beds, electrical cords, etc?: Yes Adequate lighting in your home to reduce risk of falls?: Yes Life alert?: No Use of a  cane, walker or w/c?: No Grab bars in the bathroom?: No Shower chair or bench in shower?: No Elevated toilet seat or a handicapped toilet?: No  TIMED UP AND GO:  Was the test performed?  No  Cognitive Function: 6CIT completed    07/14/2019    2:03 PM 07/06/2018   10:32 AM 07/06/2017    8:55 AM  MMSE - Mini Mental State Exam  Orientation to time 5 5 5   Orientation to Place 5 5 5   Registration 3 3 3   Attention/ Calculation 5 0 0  Recall 3 2 3   Recall-comments  unable to recall 1 of 3 words   Language- name 2 objects  0 0  Language- repeat 1 1 1   Language- follow 3 step command  3 3  Language- read & follow direction  0 0  Write a sentence  0 0  Copy design  0 0  Total score  19 20        10/07/2023   10:32 AM 10/06/2022    9:48 AM  6CIT Screen  What Year? 0 points 0 points  What month? 0 points 0 points  What time? 0 points 0 points  Count back from 20 0 points 0 points  Months in reverse 0 points 0 points  Repeat phrase 0 points 0 points  Total Score 0 points 0 points    Immunizations Immunization History  Administered Date(s) Administered   Fluad Quad(high Dose 65+) 07/21/2019, 04/21/2023   Influenza,inj,Quad PF,6+ Mos 05/23/2014, 06/10/2015, 07/01/2016, 07/06/2017, 07/18/2018   Moderna Sars-Covid-2 Vaccination 08/12/2019, 09/09/2019, 04/30/2020   Pneumococcal Conjugate-13 05/23/2014   Pneumococcal Polysaccharide-23 02/12/2012   Td 02/12/2012    Screening Tests Health Maintenance  Topic Date Due   OPHTHALMOLOGY EXAM  Never done   Zoster Vaccines- Shingrix (1 of 2) Never done   DTaP/Tdap/Td (2 - Tdap) 02/11/2022   COVID-19 Vaccine (4 - 2024-25 season) 02/28/2023   INFLUENZA VACCINE  01/28/2024   HEMOGLOBIN A1C  03/31/2024   FOOT EXAM  06/28/2024   Diabetic kidney evaluation - Urine ACR  09/29/2024   Diabetic kidney evaluation - eGFR measurement  09/30/2024   Medicare Annual Wellness (AWV)  10/06/2024   Pneumonia Vaccine 2+ Years old  Completed   Hepatitis  C Screening  Completed   HPV VACCINES  Aged Out   Meningococcal B Vaccine  Aged Out   Colonoscopy  Discontinued   Fecal DNA (Cologuard)  Discontinued    Health Maintenance  Health Maintenance Due  Topic Date Due  OPHTHALMOLOGY EXAM  Never done   Zoster Vaccines- Shingrix (1 of 2) Never done   DTaP/Tdap/Td (2 - Tdap) 02/11/2022   COVID-19 Vaccine (4 - 2024-25 season) 02/28/2023   Health Maintenance Items Addressed: Pt has Eye appt in May for exam  Additional Screening:  Vision Screening: Recommended annual ophthalmology exams for early detection of glaucoma and other disorders of the eye.  Dental Screening: Recommended annual dental exams for proper oral hygiene  Community Resource Referral / Chronic Care Management: CRR required this visit?  No   CCM required this visit?  No    Plan:     I have personally reviewed and noted the following in the patient's chart:   Medical and social history Use of alcohol, tobacco or illicit drugs  Current medications and supplements including opioid prescriptions. Patient is not currently taking opioid prescriptions. Functional ability and status Nutritional status Physical activity Advanced directives List of other physicians Hospitalizations, surgeries, and ER visits in previous 12 months Vitals Screenings to include cognitive, depression, and falls Referrals and appointments  In addition, I have reviewed and discussed with patient certain preventive protocols, quality metrics, and best practice recommendations. A written personalized care plan for preventive services as well as general preventive health recommendations were provided to patient.     Nerissa Bannister, LPN   1/61/0960   After Visit Summary: (MyChart) Due to this being a telephonic visit, the after visit summary with patients personalized plan was offered to patient via MyChart   Notes: Nothing significant to report at this time. Pt sts he is concerned about  his liver. LFT's improved but still concerned.

## 2023-10-08 ENCOUNTER — Other Ambulatory Visit: Payer: Self-pay

## 2023-10-11 DIAGNOSIS — M6281 Muscle weakness (generalized): Secondary | ICD-10-CM | POA: Diagnosis not present

## 2023-10-19 ENCOUNTER — Encounter (HOSPITAL_COMMUNITY): Payer: Self-pay

## 2023-10-19 ENCOUNTER — Ambulatory Visit (HOSPITAL_COMMUNITY)
Admission: RE | Admit: 2023-10-19 | Discharge: 2023-10-19 | Disposition: A | Discharge status: Home / Self Care | Payer: Medicare Other | Source: Ambulatory Visit | Attending: Internal Medicine | Admitting: Internal Medicine

## 2023-10-19 ENCOUNTER — Inpatient Hospital Stay: Payer: Medicare Other | Attending: Internal Medicine

## 2023-10-19 DIAGNOSIS — C3490 Malignant neoplasm of unspecified part of unspecified bronchus or lung: Secondary | ICD-10-CM | POA: Insufficient documentation

## 2023-10-19 DIAGNOSIS — J90 Pleural effusion, not elsewhere classified: Secondary | ICD-10-CM | POA: Diagnosis not present

## 2023-10-19 DIAGNOSIS — Z9221 Personal history of antineoplastic chemotherapy: Secondary | ICD-10-CM | POA: Insufficient documentation

## 2023-10-19 DIAGNOSIS — D649 Anemia, unspecified: Secondary | ICD-10-CM | POA: Insufficient documentation

## 2023-10-19 DIAGNOSIS — C7951 Secondary malignant neoplasm of bone: Secondary | ICD-10-CM | POA: Diagnosis not present

## 2023-10-19 DIAGNOSIS — Z79899 Other long term (current) drug therapy: Secondary | ICD-10-CM | POA: Insufficient documentation

## 2023-10-19 DIAGNOSIS — C801 Malignant (primary) neoplasm, unspecified: Secondary | ICD-10-CM | POA: Diagnosis not present

## 2023-10-19 LAB — CMP (CANCER CENTER ONLY)
ALT: 17 U/L (ref 0–44)
AST: 29 U/L (ref 15–41)
Albumin: 3.6 g/dL (ref 3.5–5.0)
Alkaline Phosphatase: 170 U/L — ABNORMAL HIGH (ref 38–126)
Anion gap: 5 (ref 5–15)
BUN: 15 mg/dL (ref 8–23)
CO2: 26 mmol/L (ref 22–32)
Calcium: 9.2 mg/dL (ref 8.9–10.3)
Chloride: 108 mmol/L (ref 98–111)
Creatinine: 0.94 mg/dL (ref 0.61–1.24)
GFR, Estimated: >60 mL/min (ref >60)
Glucose, Bld: 102 mg/dL — ABNORMAL HIGH (ref 70–99)
Potassium: 4 mmol/L (ref 3.5–5.1)
Sodium: 139 mmol/L (ref 135–145)
Total Bilirubin: 0.9 mg/dL (ref 0.0–1.2)
Total Protein: 6.4 g/dL — ABNORMAL LOW (ref 6.5–8.1)

## 2023-10-19 LAB — CBC WITH DIFFERENTIAL (CANCER CENTER ONLY)
Abs Immature Granulocytes: 0.01 10*3/uL (ref 0.00–0.07)
Basophils Absolute: 0.1 10*3/uL (ref 0.0–0.1)
Basophils Relative: 2 %
Eosinophils Absolute: 0.4 10*3/uL (ref 0.0–0.5)
Eosinophils Relative: 10 %
HCT: 35.6 % — ABNORMAL LOW (ref 39.0–52.0)
Hemoglobin: 12.2 g/dL — ABNORMAL LOW (ref 13.0–17.0)
Immature Granulocytes: 0 %
Lymphocytes Relative: 31 %
Lymphs Abs: 1.4 10*3/uL (ref 0.7–4.0)
MCH: 34.5 pg — ABNORMAL HIGH (ref 26.0–34.0)
MCHC: 34.3 g/dL (ref 30.0–36.0)
MCV: 100.6 fL — ABNORMAL HIGH (ref 80.0–100.0)
Monocytes Absolute: 0.7 10*3/uL (ref 0.1–1.0)
Monocytes Relative: 15 %
Neutro Abs: 2 10*3/uL (ref 1.7–7.7)
Neutrophils Relative %: 42 %
Platelet Count: 156 10*3/uL (ref 150–400)
RBC: 3.54 MIL/uL — ABNORMAL LOW (ref 4.22–5.81)
RDW: 13.1 % (ref 11.5–15.5)
WBC Count: 4.6 10*3/uL (ref 4.0–10.5)
nRBC: 0 % (ref 0.0–0.2)

## 2023-10-19 MED ORDER — IOHEXOL 300 MG/ML  SOLN
100.0000 mL | Freq: Once | INTRAMUSCULAR | Status: AC | PRN
  Administered 2023-10-19: 100 mL via INTRAVENOUS

## 2023-10-19 MED ORDER — SODIUM CHLORIDE (PF) 0.9 % IJ SOLN
INTRAMUSCULAR | Status: AC
  Filled 2023-10-19: qty 50

## 2023-10-21 NOTE — Progress Notes (Unsigned)
 Virtual Visit via Telephone Note   Because of Larry Kent co-morbid illnesses, he is at least at moderate risk for complications without adequate follow up.  This format is felt to be most appropriate for this patient at this time.  Due to technical limitations with video connection (technology), today's appointment will be conducted as an audio only telehealth visit, and [name] verbally agreed to proceed in this manner.   All issues noted in this document were discussed and addressed.  No physical exam could be performed with this format.  Evaluation Performed:  Preoperative cardiovascular risk assessment _____________   Date:  10/21/2023   Patient ID:  Larry Kent, DOB Feb 01, 1946, MRN [id number] Patient Location:  Home Provider location:   Office  Primary Care Provider:  Claire Crick, MD Primary Cardiologist:  None  Chief Complaint / Patient Profile   78 y.o. y/o male with a h/o coronary artery disease, hyperlipidemia, aortic valve stenosis who is pending colonoscopy and presents today for telephonic preoperative cardiovascular risk assessment.  History of Present Illness    Larry Kent is a 78 y.o. male who presents via audio/video conferencing for a telehealth visit today.  Pt was last seen in cardiology clinic on 06/09/2023 by Lawana Pray, NP-C.  At that time CAS TRACZ was doing well .  The patient is now pending procedure as outlined above. Since his last visit, he continues to be stable from a cardiac standpoint.  Today he denies chest pain, shortness of breath, lower extremity edema, fatigue, palpitations, melena, hematuria, hemoptysis, diaphoresis, weakness, presyncope, syncope, orthopnea, and PND.   Past Medical History    Past Medical History:  Diagnosis Date   Allergy    Arthritis    both knees    BCC (basal cell carcinoma), arm, right 09/2019   MOHS ([place])   BCC (basal cell carcinoma), face 2014   L preauricular s/p MOHS   BCC  (basal cell carcinoma), face 02/2018   L upper lip   BPH (benign prostatic hypertrophy)    Dr. Derrick Fling @ [facility]   Carotid stenosis 05/2015   RICA 40-59%, LICA 1-39%, L vertebral occlusion, rpt 1 yr   Cataract    removed both eyes    Diabetes mellitus without complication (HCC)    FHx: colon cancer    FHx: prostate cancer    Heart murmur    mild aortic stenosis    History of kidney stones    Hyperlipidemia    borderline- on rosuvastatin  now normal    Occlusion of right vertebral artery 05/2015   Past Surgical History:  Procedure Laterality Date   BASAL CELL CARCINOMA EXCISION  05/2018   lip, 2020 x3  basal cells removed    CATARACT EXTRACTION  2008   Left   CATARACT EXTRACTION  2013   Right ([facility])   COLONOSCOPY  ~2010   medium int hemorrhoids, o/w WNL, rpt 5 yrs given fmhx (Dr. Denece Finger)   COLONOSCOPY  08/2019   TAs, diverticulosis, rpt 3 yrs ([place])   CORONARY/GRAFT ACUTE MI REVASCULARIZATION N/A 09/07/2022   Procedure: Coronary/Graft Acute MI Revascularization;  Surgeon: Kyra Phy, MD;  Location: [facility];  Service: Cardiovascular;  Laterality: N/A;   ENDARTERECTOMY Right 12/01/2019   Procedure: RIGHT ENDARTERECTOMY CAROTID;  Surgeon: Young Hensen, MD;  Location: [facility] OR;  Service: Vascular;  Laterality: Right;   exercise treadmill  11/2005   WNL Jacquelynn Matter)   FINE NEEDLE ASPIRATION  07/28/2022   Procedure:  FINE NEEDLE ASPIRATION (FNA) LINEAR;  Surgeon: Prudy Brownie, DO;  Location: [facility];  Service: Pulmonary;;   HERNIA REPAIR  2008   Right   IR IMAGING GUIDED PORT INSERTION  08/11/2022   IR IMAGING GUIDED PORT INSERTION  10/08/2022   IR REMOVAL TUN ACCESS W/ PORT W/O FL MOD SED  09/10/2022   LEFT HEART CATH AND CORONARY ANGIOGRAPHY N/A 09/07/2022   Procedure: LEFT HEART CATH AND CORONARY ANGIOGRAPHY;  Surgeon: Kyra Phy, MD;  Location: [facility];  Service: Cardiovascular;  Laterality: N/A;   MOHS SURGERY Left spring 2014    basal cell face   PATCH ANGIOPLASTY Right 12/01/2019   Procedure: PATCH ANGIOPLASTY USING Corinna Dickens BIOLOGIC PATCH;  Surgeon: Young Hensen, MD;  Location: [facility] OR;  Service: Vascular;  Laterality: Right;   Testicular Biopsy  2003   benign, varicocele   US  ECHOCARDIOGRAPHY  11/2005   aortic sclerosis, EF 55-60%, diastolic dysfunction   VIDEO BRONCHOSCOPY WITH ENDOBRONCHIAL ULTRASOUND Bilateral 07/28/2022   Procedure: VIDEO BRONCHOSCOPY WITH ENDOBRONCHIAL ULTRASOUND;  Surgeon: Prudy Brownie, DO;  Location: [facility];  Service: Pulmonary;  Laterality: Bilateral;    Allergies  No Known Allergies  Home Medications    Prior to Admission medications   Medication Sig Start Date End Date Taking? Authorizing Provider  abiraterone acetate (ZYTIGA) 250 MG tablet Take 1,000 mg by mouth daily. 10/01/22   Christina Coyer, MD  Accu-Chek Softclix Lancets lancets SMARTSIG:Topical 06/29/23   [provider]  acetaminophen  (TYLENOL ) 500 MG tablet Take 1,000 mg by mouth every 6 (six) hours as needed for mild pain or headache.    [provider]  aspirin  EC 81 MG tablet Take 1 tablet (81 mg total) by mouth daily. Swallow whole. 08/12/23   Wenona Hamilton, MD  Blood Glucose Monitoring Suppl DEVI 1 each by Does not apply route in the morning, at noon, and at bedtime. E11.69. May substitute to any manufacturer covered by patient's insurance. 06/29/23   Claire Crick, MD  Calcium  Carb-Cholecalciferol (RA CALCIUM  PLUS VITAMIN D) 600-10 MG-MCG TABS Take 600 mg by mouth daily.    [provider]  clopidogrel  (PLAVIX ) 75 MG tablet Take 1 tablet (75 mg total) by mouth daily. 09/28/23   Wenona Hamilton, MD  fluticasone  (FLONASE ) 50 MCG/ACT nasal spray Place 1 spray into both nostrils daily as needed for allergies or rhinitis.    [provider]  furosemide  (LASIX ) 20 MG tablet Take 1 tablet (20 mg total) by mouth as needed (take if increase in weight  of 2-3 lbs overnight  or 8 lbs in a week). 06/09/23 10/05/23  Carie Charity, NP  glipiZIDE  (GLUCOTROL ) 5 MG tablet TAKE 1 TABLET (5 MG) BY MOUTH DAILY WITH BREAKFAST. AS NEEDED FOR HIGH SUGARS WITH PREDNISONE  USE Patient taking differently: Take 5 mg by mouth daily before breakfast. 09/27/23   Claire Crick, MD  lidocaine -prilocaine  (EMLA ) cream Apply to the Port-A-Cath site 30-60 minutes before chemotherapy treatment Patient taking differently: Apply 1 Application topically See admin instructions. Apply to the Port-A-Cath site 30-60 minutes before chemotherapy treatment 08/03/22   Marlene Simas, MD  metoprolol  tartrate (LOPRESSOR ) 25 MG tablet Take 0.5 tablets (12.5 mg total) by mouth 2 (two) times daily. 08/25/23 08/24/24  Wenona Hamilton, MD  Multiple Vitamin (MULTIVITAMIN) tablet Take 1 tablet by mouth daily with breakfast.    [provider]  NUBEQA 300 MG tablet Take 600 mg by mouth 2 (two) times daily. 09/13/23  [provider]  OVER THE COUNTER MEDICATION Xtendi darolomipe pt taking 250 mg Patient not taking: Reported on 10/07/2023    [provider]  pantoprazole  (PROTONIX ) 20 MG tablet TAKE 1 TABLET BY MOUTH EVERY DAY 09/06/23   Heilingoetter, Cassandra L, PA-C  potassium chloride  (KLOR-CON ) 10 MEQ tablet Take 1 tablet (10 mEq total) by mouth as needed. 06/09/23 10/05/23  Carie Charity, NP  predniSONE  (DELTASONE ) 5 MG tablet Take 1 tablet (5 mg total) by mouth 2 (two) times daily with a meal. 08/10/23   Claire Crick, MD  prochlorperazine  (COMPAZINE ) 10 MG tablet Take 1 tablet (10 mg total) by mouth every 6 (six) hours as needed for nausea or vomiting. Patient not taking: Reported on 10/07/2023 10/12/22   Marlene Simas, MD  rosuvastatin  (CRESTOR ) 5 MG tablet Take 1 tablet (5 mg total) by mouth daily. 08/26/23   Wenona Hamilton, MD  tamsulosin  (FLOMAX ) 0.4 MG CAPS capsule Take 0.4 mg by mouth 2 (two) times daily.    [provider]  Zinc 25 MG TABS Take 25 mg by mouth  daily.    [provider]    Physical Exam    Vital Signs:  BRAISON SNOKE does not have vital signs available for review today.  Given telephonic nature of communication, physical exam is limited. AAOx3. NAD. Normal affect.  Speech and respirations are unlabored.  Accessory Clinical Findings    None  Assessment & Plan    1.  Preoperative Cardiovascular Risk Assessment: Colonoscopy, [facility], fax #[phone].    Primary Cardiologist: Dr. Alvenia Aus  Chart reviewed as part of pre-operative protocol coverage. Given past medical history and time since last visit, based on ACC/AHA guidelines, KEANU LESNIAK would be at acceptable risk for the planned procedure without further cardiovascular testing.   Patient was advised that if he develops new symptoms prior to surgery to contact our office to arrange a follow-up appointment.  He verbalized understanding.  His Plavix  may be held for 5 days prior to his procedure.  Please resume as soon as hemostasis is achieved.  I will route this recommendation to the requesting party via Epic fax function and remove from pre-op pool.        Time:   Today, I have spent 5 minutes with the patient with telehealth technology discussing medical history, symptoms, and management plan.  I spent 10 minutes reviewing his past medical history, cardiac medications, and cardiac testing.   Carie Charity, NP  10/21/2023, 1:28 PM

## 2023-10-22 ENCOUNTER — Ambulatory Visit: Attending: Cardiology

## 2023-10-22 DIAGNOSIS — Z01810 Encounter for preprocedural cardiovascular examination: Secondary | ICD-10-CM | POA: Diagnosis not present

## 2023-10-25 ENCOUNTER — Telehealth: Payer: Self-pay

## 2023-10-25 NOTE — Telephone Encounter (Signed)
 I called the patient regarding his procedure to inform him to hold his Plavix  5 days before his procedure. The patient understood.

## 2023-10-26 ENCOUNTER — Encounter: Payer: Self-pay | Admitting: Gastroenterology

## 2023-10-26 ENCOUNTER — Inpatient Hospital Stay: Payer: Medicare Other | Admitting: Internal Medicine

## 2023-10-26 VITALS — BP 127/74 | HR 79 | Temp 97.7°F | Resp 16 | Ht 68.0 in | Wt 192.9 lb

## 2023-10-26 DIAGNOSIS — C3490 Malignant neoplasm of unspecified part of unspecified bronchus or lung: Secondary | ICD-10-CM | POA: Diagnosis not present

## 2023-10-26 DIAGNOSIS — Z9221 Personal history of antineoplastic chemotherapy: Secondary | ICD-10-CM | POA: Diagnosis not present

## 2023-10-26 DIAGNOSIS — C7951 Secondary malignant neoplasm of bone: Secondary | ICD-10-CM | POA: Diagnosis not present

## 2023-10-26 DIAGNOSIS — Z79899 Other long term (current) drug therapy: Secondary | ICD-10-CM | POA: Diagnosis not present

## 2023-10-26 DIAGNOSIS — D649 Anemia, unspecified: Secondary | ICD-10-CM | POA: Diagnosis not present

## 2023-10-26 NOTE — Progress Notes (Signed)
 [facility] Telephone:([phone]   [phone]  OFFICE PROGRESS NOTE  Claire Crick, MD 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 Derrick Fling   PRIOR THERAPY: None   CURRENT THERAPY:  Palliative systemic chemotherapy with carboplatin  for AUC of 5 and Imfinzi  1500 Mg IV on day 1 as well as etoposide  100 Mg/M2 on days 1, 2 and 3 with Neulasta  support on day 5. Status post 7 cycles. First dose on 08/11/22.  Starting from cycle #2 his carboplatin  will be reduced to AUC of 4 and 2 etoposide  80 Mg/M2 secondary to intolerance and cycle #1.  Starting from cycle #5 he will be on maintenance treatment with single agent Imfinzi  1500 Mg IV every 4 weeks.  INTERVAL HISTORY: Larry Kent 78 y.o. male returns to the clinic today for follow-up visit.Discussed the use of AI scribe software for clinical note transcription with the patient, who gave verbal consent to proceed.  History of Present Illness   Larry Kent is a 78 year old male with lung cancer who presents for evaluation with a repeat CT scan for restaging of his disease.  He has a history of lung cancer and previously underwent four cycles of chemotherapy followed by three cycles of maintenance treatment with Imfinzi , which was discontinued due to liver dysfunction. He is currently under observation and is here for evaluation with a repeat CT scan of the chest, abdomen, and pelvis for restaging of his disease.  He experiences decreased  energy levels, which he attributes to reduced physical activity. He does not walk as often as he used to, partly due to arthritis in his knee, which was treated in the spring. Weather changes exacerbate his arthritis, making it difficult to stay active. Despite this, he has been able to mow the grass, which he finds helpful.  He has mild anemia, which has not significantly impacted his daily activities. He is not currently on any treatment for his cancer and is being monitored with periodic scans.  No new complaints since his last visit three months ago.         MEDICAL HISTORY: Past Medical History:  Diagnosis Date   Allergy    Arthritis    both knees    BCC (basal cell carcinoma), arm, right 09/2019   MOHS ([place])   BCC (basal cell carcinoma), face 2014   L preauricular s/p MOHS   BCC (basal cell carcinoma), face 02/2018   L upper lip   BPH (benign prostatic hypertrophy)    Dr. Derrick Fling @ [facility]   Carotid stenosis 05/2015   RICA 40-59%, LICA 1-39%, L vertebral occlusion, rpt 1 yr   Cataract    removed both eyes    Diabetes mellitus without complication (HCC)    FHx: colon cancer    FHx: prostate cancer    Heart murmur    mild aortic stenosis    History of kidney stones    Hyperlipidemia    borderline- on rosuvastatin  now normal    Occlusion of right vertebral artery 05/2015    ALLERGIES:  has no  known allergies.  MEDICATIONS:  Current Outpatient Medications  Medication Sig Dispense Refill   abiraterone acetate (ZYTIGA) 250 MG tablet Take 1,000 mg by mouth daily.     Accu-Chek Softclix Lancets lancets SMARTSIG:Topical     acetaminophen  (TYLENOL ) 500 MG tablet Take 1,000 mg by mouth every 6 (six) hours as needed for mild pain or headache.     aspirin  EC 81 MG tablet Take 1 tablet (81 mg total) by mouth daily. Swallow whole.     Blood Glucose Monitoring Suppl DEVI 1 each by Does not apply route in the morning, at noon, and at bedtime. E11.69. May substitute to any  manufacturer covered by patient's insurance. 1 each 0   Calcium  Carb-Cholecalciferol (RA CALCIUM  PLUS VITAMIN D) 600-10 MG-MCG TABS Take 600 mg by mouth daily.     clopidogrel  (PLAVIX ) 75 MG tablet Take 1 tablet (75 mg total) by mouth daily. 90 tablet 1   fluticasone  (FLONASE ) 50 MCG/ACT nasal spray Place 1 spray into both nostrils daily as needed for allergies or rhinitis.     furosemide  (LASIX ) 20 MG tablet Take 1 tablet (20 mg total) by mouth as needed (take if increase in weight  of 2-3 lbs overnight or 8 lbs in a week). 90 tablet 3   glipiZIDE  (GLUCOTROL ) 5 MG tablet TAKE 1 TABLET (5 MG) BY MOUTH DAILY WITH BREAKFAST. AS NEEDED FOR HIGH SUGARS WITH PREDNISONE  USE (Patient taking differently: Take 5 mg by mouth daily before breakfast.) 90 tablet 1   lidocaine -prilocaine  (EMLA ) cream Apply to the Port-A-Cath site 30-60 minutes before chemotherapy treatment (Patient taking differently: Apply 1 Application topically See admin instructions. Apply to the Port-A-Cath site 30-60 minutes before chemotherapy treatment) 30 g 0   metoprolol  tartrate (LOPRESSOR ) 25 MG tablet Take 0.5 tablets (12.5 mg total) by mouth 2 (two) times daily. 90 tablet 0   Multiple Vitamin (MULTIVITAMIN) tablet Take 1 tablet by mouth daily with breakfast.     NUBEQA 300 MG tablet Take 600 mg by mouth 2 (two) times daily.     OVER THE COUNTER MEDICATION Xtendi darolomipe pt taking 250 mg (Patient not taking: Reported on 10/07/2023)     pantoprazole  (PROTONIX ) 20 MG tablet TAKE 1 TABLET BY MOUTH EVERY DAY 90 tablet 1   potassium chloride  (KLOR-CON ) 10 MEQ tablet Take 1 tablet (10 mEq total) by mouth as needed. 90 tablet 3   predniSONE  (DELTASONE ) 5 MG tablet Take 1 tablet (5 mg total) by mouth 2 (two) times daily with a meal.     prochlorperazine  (COMPAZINE ) 10 MG tablet Take 1 tablet (10 mg total) by mouth every 6 (six) hours as needed for nausea or vomiting. (Patient not taking: Reported on 10/07/2023) 30 tablet 0   rosuvastatin   (CRESTOR ) 5 MG tablet Take 1 tablet (5 mg total) by mouth daily. 90 tablet 1   tamsulosin  (FLOMAX ) 0.4 MG CAPS capsule Take 0.4 mg by mouth 2 (two) times daily.     Zinc 25 MG TABS Take 25 mg by mouth daily.     No current facility-administered medications for this visit.    SURGICAL HISTORY:  Past Surgical History:  Procedure Laterality Date   BASAL CELL CARCINOMA EXCISION  05/2018   lip, 2020 x3  basal cells removed    CATARACT EXTRACTION  2008   Left   CATARACT EXTRACTION  2013   Right ([place])   COLONOSCOPY  ~2010   medium int hemorrhoids, o/w WNL, rpt 5 yrs given fmhx (Dr. Denece Finger)   COLONOSCOPY  08/2019   TAs, diverticulosis, rpt 3 yrs ([place])   CORONARY/GRAFT ACUTE MI REVASCULARIZATION N/A 09/07/2022   Procedure: Coronary/Graft Acute MI Revascularization;  Surgeon: Kyra Phy, MD;  Location: [facility];  Service: Cardiovascular;  Laterality: N/A;   ENDARTERECTOMY Right 12/01/2019   Procedure: RIGHT ENDARTERECTOMY CAROTID;  Surgeon: Young Hensen, MD;  Location[facility];  Service: Vascular;  Laterality: Right;   exercise treadmill  11/2005   WNL Jacquelynn Matter)   FINE NEEDLE ASPIRATION  07/28/2022   Procedure: FINE NEEDLE ASPIRATION (FNA) LINEAR;  Surgeon: Prudy Brownie, DO;  Location: [facility] ENDOS[facility];  Service: Pulmonary;;   HERNIA REPAIR  2008   Right   IR IMAGING GUIDED PORT INSERTION  08/11/2022   IR IMAGING GUIDED PORT INSERTION  10/08/2022   IR REMOVAL TUN ACCESS W/ PORT W/O FL MOD SED  09/10/2022   LEFT HEART CATH AND CORONARY ANGIOGRAPHY N/A 09/07/2022   Procedure: LEFT HEART CATH AND CORONARY ANGIOGRAPHY;  Surgeon: Kyra Phy, MD;  Location: [facility];  Service: Cardiovascular;  Laterality: N/A;   MOHS SURGERY Left spring 2014   basal cell face   PATCH ANGIOPLASTY Right 12/01/2019   Procedure: PATCH ANGIOPLASTY USING Corinna Dickens BIOLOGIC PATCH;  Surgeon: Young Hensen, MD;  Location: [facility] OR;  Service: Vascular;  Laterality: Right;    Testicular Biopsy  2003   benign, varicocele   US  ECHOCARDIOGRAPHY  11/2005   aortic sclerosis, EF 55-60%, diastolic dysfunction   VIDEO BRONCHOSCOPY WITH ENDOBRONCHIAL ULTRASOUND Bilateral 07/28/2022   Procedure: VIDEO BRONCHOSCOPY WITH ENDOBRONCHIAL ULTRASOUND;  Surgeon: Prudy Brownie, DO;  Location: [facility];  Service: Pulmonary;  Laterality: Bilateral;    REVIEW OF SYSTEMS:  Constitutional: positive for fatigue Eyes: negative Ears, nose, mouth, throat, and face: negative Respiratory: negative Cardiovascular: negative Gastrointestinal: negative Genitourinary:negative Integument/breast: negative Hematologic/lymphatic: negative Musculoskeletal:negative Neurological: negative Behavioral/Psych: negative Endocrine: negative Allergic/Immunologic: negative   PHYSICAL EXAMINATION: General appearance: alert, cooperative, fatigued, and no distress Head: Normocephalic, without obvious abnormality, atraumatic Neck: no adenopathy, no JVD, supple, symmetrical, trachea midline, and thyroid  not enlarged, symmetric, no tenderness/mass/nodules Lymph nodes: Cervical, supraclavicular, and axillary nodes normal. Resp: clear to auscultation bilaterally Back: symmetric, no curvature. ROM normal. No CVA tenderness. Cardio: regular rate and rhythm, S1, S2 normal, no murmur, click, rub or gallop GI: soft, non-tender; bowel sounds normal; no masses,  no organomegaly Extremities: extremities normal, atraumatic, no cyanosis or edema Neurologic: Alert and oriented X 3, normal strength and tone. Normal symmetric reflexes. Normal coordination and gait  ECOG PERFORMANCE STATUS: 1 - Symptomatic but completely ambulatory  Blood pressure 127/74, pulse 79, temperature 97.7 F (36.5 C), temperature source Temporal, resp. rate 16, height 5\' 8"  (1.727 m), weight 192 lb 14.4 oz (87.5 kg), SpO2 98%.  LABORATORY DATA: Lab Results  Component Value Date   WBC 4.6 10/19/2023   HGB 12.2 (L) 10/19/2023   HCT  35.6 (L) 10/19/2023   MCV 100.6 (H) 10/19/2023   PLT 156 10/19/2023      Chemistry      Component Value Date/Time   NA 139 10/19/2023 0859   NA 140 05/31/2023 1417   K 4.0 10/19/2023 0859   CL 108 10/19/2023 0859   CO2 26 10/19/2023 0859   BUN 15 10/19/2023 0859   BUN 14 05/31/2023 1417   CREATININE 0.94 10/19/2023 0859      Component Value Date/Time   CALCIUM  9.2 10/19/2023 0859   ALKPHOS 170 (H) 10/19/2023 0859   AST 29 10/19/2023 0859  ALT 17 10/19/2023 0859   BILITOT 0.9 10/19/2023 0859       RADIOGRAPHIC STUDIES: CT Chest W Contrast Result Date: 10/26/2023 CLINICAL DATA:  History of non-small cell lung cancer and prostate cancer. Recently completed therapy. Currently taking a maintenance testosterone  blocker. EXAM: CT CHEST, ABDOMEN, AND PELVIS WITH CONTRAST TECHNIQUE: Multidetector CT imaging of the chest, abdomen and pelvis was performed following the standard protocol during bolus administration of intravenous contrast. RADIATION DOSE REDUCTION: This exam was performed according to the departmental dose-optimization program which includes automated exposure control, adjustment of the mA and/or kV according to patient size and/or use of iterative reconstruction technique. CONTRAST:  OMNIPAQUE  IOHEXOL  300 MG/ML  SOLN COMPARISON:  Chest CT with contrast 07/23/2023, chest, abdomen and pelvis CT with contrast 04/26/2023, and chest CT with contrast 02/19/2023. FINDINGS: CT CHEST FINDINGS Cardiovascular: Left chest port again noted with IJ approach catheter terminating in the upper right atrium. The heart is slightly enlarged. The coronary arteries and aortic valve leaflets are heavily calcified. There is a small pericardial effusion again noted. There is calcification in the mitral annulus as well. Calcific plaques in the aorta and great vessels without aneurysm, stenosis or dissection. The pulmonary arteries and veins are normal caliber. The pulmonary arteries are centrally  clear. Mediastinum/Nodes: Stable slightly prominent subcarinal lymph node to the right again measures 1.4 x 1.2 cm. No new, progressive or further intrathoracic adenopathy is seen. Axillary spaces are clear. The thyroid  gland, thoracic trachea, and thoracic esophagus are unremarkable. Lungs/Pleura: Small layering left pleural effusion, new from prior studies. No right pleural fluid. There is a spiculated nodule in the right middle lobe with pleural stranding, unchanged in size measuring 1.6 x 1 cm on 6:88. Higher up in the right middle lobe, a second irregular nodule again measures 1.1 cm on 6:81. There are multiple additional bilateral small subcentimeter nodules, for example several scattered throughout the right upper lobe, largest 7 mm on 6:58 and a left upper lobe 6 mm nodule unchanged on 6:64 (but the latter was new as of the last CT) with a few tiny ones in the bilateral lower lobes some of which were new on the last CT, and additional left upper lobe nodules largest 1 x 0.8 cm on 6:60 and has been seen on prior studies. There are few linear scar-like opacities in the bases and mild bibasal subpleural reticulation. Musculoskeletal: Kyphosis and degenerative change thoracic spine. Sclerotic metastasis right posterior T12 vertebral body extending into the right pedicle appears similar. No new regional thoracic level bone metastasis or further focal findings. CT ABDOMEN PELVIS FINDINGS Hepatobiliary: Cirrhotic liver configuration and morphology. No mass enhancement. Mild hepatic steatosis. Unremarkable gallbladder and bile ducts. Pancreas: Generalized fatty atrophy again noted but greatest proximally. No mass enhancement. Spleen: No abnormality.  No splenomegaly. Adrenals/Urinary Tract: No adrenal or renal mass enhancement. 1.6 cm Bosniak 1 cyst again noted in the inferomedial left kidney, Hounsfield density is 13. No follow-up imaging is recommended. There is symmetric delayed phase renal excretion. Eccentric  left lateral bladder thickening again noted up to 1 cm with impression on the bladder base by the prostate. Stomach/Bowel: No dilatation or wall thickening. An appendix is not seen. Sigmoid diverticulosis without evidence of diverticulitis. Vascular/Lymphatic: Aortic atherosclerosis. No enlarged abdominal or pelvic lymph nodes. Reproductive: Heterogeneous enhancement and post treatment calcifications are noted in the prostate gland but there is a normal size overall. Slight impression of the median lobe into the bladder base. Other: Small inguinal fat hernias. No  free fluid or free air. No incarcerated hernia. Musculoskeletal: In addition to the T12 metastasis there is a smaller sclerotic metastasis posteriorly of the L2 vertebral body, sclerotic metastatic disease again both inferior pubic rami greater on the right, and right hemisacrum greater than left. No new or progressive metastatic bone disease. Degenerative change and mild dextroscoliosis lumbar spine. IMPRESSION: 1. New small layering left pleural effusion. 2. Stable spiculated nodules in the right middle lobe, with multiple additional bilateral small subcentimeter nodules, some of which were new on the last CT. 3. Stable slightly prominent subcarinal lymph node. No new, progressive or further intrathoracic adenopathy is seen. 4. Stable sclerotic metastatic bone disease. No new or progressive metastatic bone disease is seen. 5. No evidence of metastatic disease in the abdomen or pelvis. 6. Cirrhotic liver configuration and morphology with mild hepatic steatosis. 7. Stable appearance of eccentric left lateral bladder thickening again noted up to 1 cm with slight impression on the bladder base by the median lobe of the prostate. 8. Diverticulosis without evidence of diverticulitis. 9. Aortic and coronary artery atherosclerosis. Aortic Atherosclerosis (ICD10-I70.0). Electronically Signed   By: Denman Fischer M.D.   On: 10/26/2023 00:22   CT ABDOMEN PELVIS W  CONTRAST Result Date: 10/26/2023 CLINICAL DATA:  History of non-small cell lung cancer and prostate cancer. Recently completed therapy. Currently taking a maintenance testosterone  blocker. EXAM: CT CHEST, ABDOMEN, AND PELVIS WITH CONTRAST TECHNIQUE: Multidetector CT imaging of the chest, abdomen and pelvis was performed following the standard protocol during bolus administration of intravenous contrast. RADIATION DOSE REDUCTION: This exam was performed according to the departmental dose-optimization program which includes automated exposure control, adjustment of the mA and/or kV according to patient size and/or use of iterative reconstruction technique. CONTRAST:  OMNIPAQUE  IOHEXOL  300 MG/ML  SOLN COMPARISON:  Chest CT with contrast 07/23/2023, chest, abdomen and pelvis CT with contrast 04/26/2023, and chest CT with contrast 02/19/2023. FINDINGS: CT CHEST FINDINGS Cardiovascular: Left chest port again noted with IJ approach catheter terminating in the upper right atrium. The heart is slightly enlarged. The coronary arteries and aortic valve leaflets are heavily calcified. There is a small pericardial effusion again noted. There is calcification in the mitral annulus as well. Calcific plaques in the aorta and great vessels without aneurysm, stenosis or dissection. The pulmonary arteries and veins are normal caliber. The pulmonary arteries are centrally clear. Mediastinum/Nodes: Stable slightly prominent subcarinal lymph node to the right again measures 1.4 x 1.2 cm. No new, progressive or further intrathoracic adenopathy is seen. Axillary spaces are clear. The thyroid  gland, thoracic trachea, and thoracic esophagus are unremarkable. Lungs/Pleura: Small layering left pleural effusion, new from prior studies. No right pleural fluid. There is a spiculated nodule in the right middle lobe with pleural stranding, unchanged in size measuring 1.6 x 1 cm on 6:88. Higher up in the right middle lobe, a second irregular  nodule again measures 1.1 cm on 6:81. There are multiple additional bilateral small subcentimeter nodules, for example several scattered throughout the right upper lobe, largest 7 mm on 6:58 and a left upper lobe 6 mm nodule unchanged on 6:64 (but the latter was new as of the last CT) with a few tiny ones in the bilateral lower lobes some of which were new on the last CT, and additional left upper lobe nodules largest 1 x 0.8 cm on 6:60 and has been seen on prior studies. There are few linear scar-like opacities in the bases and mild bibasal subpleural reticulation. Musculoskeletal: Kyphosis and  degenerative change thoracic spine. Sclerotic metastasis right posterior T12 vertebral body extending into the right pedicle appears similar. No new regional thoracic level bone metastasis or further focal findings. CT ABDOMEN PELVIS FINDINGS Hepatobiliary: Cirrhotic liver configuration and morphology. No mass enhancement. Mild hepatic steatosis. Unremarkable gallbladder and bile ducts. Pancreas: Generalized fatty atrophy again noted but greatest proximally. No mass enhancement. Spleen: No abnormality.  No splenomegaly. Adrenals/Urinary Tract: No adrenal or renal mass enhancement. 1.6 cm Bosniak 1 cyst again noted in the inferomedial left kidney, Hounsfield density is 13. No follow-up imaging is recommended. There is symmetric delayed phase renal excretion. Eccentric left lateral bladder thickening again noted up to 1 cm with impression on the bladder base by the prostate. Stomach/Bowel: No dilatation or wall thickening. An appendix is not seen. Sigmoid diverticulosis without evidence of diverticulitis. Vascular/Lymphatic: Aortic atherosclerosis. No enlarged abdominal or pelvic lymph nodes. Reproductive: Heterogeneous enhancement and post treatment calcifications are noted in the prostate gland but there is a normal size overall. Slight impression of the median lobe into the bladder base. Other: Small inguinal fat hernias.  No free fluid or free air. No incarcerated hernia. Musculoskeletal: In addition to the T12 metastasis there is a smaller sclerotic metastasis posteriorly of the L2 vertebral body, sclerotic metastatic disease again both inferior pubic rami greater on the right, and right hemisacrum greater than left. No new or progressive metastatic bone disease. Degenerative change and mild dextroscoliosis lumbar spine. IMPRESSION: 1. New small layering left pleural effusion. 2. Stable spiculated nodules in the right middle lobe, with multiple additional bilateral small subcentimeter nodules, some of which were new on the last CT. 3. Stable slightly prominent subcarinal lymph node. No new, progressive or further intrathoracic adenopathy is seen. 4. Stable sclerotic metastatic bone disease. No new or progressive metastatic bone disease is seen. 5. No evidence of metastatic disease in the abdomen or pelvis. 6. Cirrhotic liver configuration and morphology with mild hepatic steatosis. 7. Stable appearance of eccentric left lateral bladder thickening again noted up to 1 cm with slight impression on the bladder base by the median lobe of the prostate. 8. Diverticulosis without evidence of diverticulitis. 9. Aortic and coronary artery atherosclerosis. Aortic Atherosclerosis (ICD10-I70.0). Electronically Signed   By: Denman Fischer M.D.   On: 10/26/2023 00:22    ASSESSMENT AND PLAN: This is a very pleasant 78 years old white male with: 1) Metastatic poorly differentiated carcinoma, neuroendocrine carcinoma of suspicious prostate primary versus primary lung cancer presented with innumerable and bilateral pulmonary nodules in addition to right hilar and mediastinal lymphadenopathy and metastatic disease to several areas of the bone in addition to hypermetabolic activity in the prostate gland diagnosed in January 2024  The patient is currently undergoing systemic chemotherapy with carboplatin  for AUC of 5 on day 1, Imfinzi  1500 Mg IV on  day 1, etoposide  100 Mg/M2 on days 1, 2 and 3 with Neulasta  support every 3 weeks status post 7 cycles.  Starting from cycle #5 the patient will be on maintenance treatment with Imfinzi  1500 Mg IV every 4 weeks.  His treatment was discontinued secondary to liver dysfunction. The patient had repeat CT scan of the chest, abdomen and pelvis performed recently.  I personally independently reviewed the scan and discussed the result with the patient today.    Metastatic poorly differentiated carcinoma, neuroendocrine carcinoma of suspicious prostate primary versus primary lung cancer presented with innumerable and bilateral pulmonary nodules in addition to right hilar and mediastinal lymphadenopathy and metastatic disease to several areas of  the bone in addition to hypermetabolic activity in the prostate gland diagnosed in January 2024. He underwent Palliative systemic chemotherapy with carboplatin  for AUC of 5 and Imfinzi  1500 Mg IV on day 1 as well as etoposide  100 Mg/M2 on days 1, 2 and 3 with Neulasta  support on day 5. Status post 7 cycles. First dose on 08/11/22.  Starting from cycle #2 his carboplatin  will be reduced to AUC of 4 and 2 etoposide  80 Mg/M2 secondary to intolerance and cycle #1.  Starting from cycle #5 he will be on maintenance treatment with single agent Imfinzi  1500 Mg IV every 4 weeks.  Under observation following cancer treatment. Recent CT scans of the chest, abdomen, and pelvis indicate well-managed disease with no new concerning findings. Reports decreased energy levels, possibly related to reduced physical activity and arthritis treatment. The duration of disease stability is unpredictable, and monitoring will continue until progression is noted. - Order repeat CT scan in three months - Advise to maintain physical activity and a healthy diet  Cancer treatment Completed four cycles of chemotherapy with Carboplatin , Etoposide  and Imfinzi  followed by three cycles of maintenance treatment  with Imfinzi , discontinued due to liver dysfunction. Currently not receiving active cancer treatment and remains under observation. Some patients can remain stable without treatment for extended periods, supporting the current observation approach.  Liver dysfunction Liver dysfunction led to the discontinuation of Imfinzi . No current indication of worsening liver function.  Mild anemia Very mild anemia, not severe enough to limit physical activities. No immediate intervention required as it does not impact daily life.   The patient was advised to call immediately if he has any concerning symptoms in the interval. The patient voices understanding of current disease status and treatment options and is in agreement with the current care plan.  All questions were answered. The patient knows to call the clinic with any problems, questions or concerns. We can certainly see the patient much sooner if necessary.  The total time spent in the appointment was 30 minutes.  Disclaimer: This note was dictated with voice recognition software. Similar sounding words can inadvertently be transcribed and may not be corrected upon review.

## 2023-10-27 ENCOUNTER — Telehealth: Payer: Self-pay | Admitting: Gastroenterology

## 2023-10-27 NOTE — Telephone Encounter (Signed)
 Hey there, Larry Kent!  Received cardiologist note from 10/22/23 stating this patient is moderate risk for procedures. Could you possibly take a look and see if he is an [facility] candidate? Otherwise we can move his procedure to the hospital. Thanks in advance!  - [facility]

## 2023-10-28 ENCOUNTER — Telehealth: Payer: Self-pay | Admitting: Internal Medicine

## 2023-10-28 NOTE — Telephone Encounter (Signed)
 Scheduled appointments around a CT scan expected date. The patient is aware of the appointment details and will be mailed an appointment reminder.

## 2023-11-02 ENCOUNTER — Telehealth: Payer: Self-pay | Admitting: Gastroenterology

## 2023-11-02 ENCOUNTER — Encounter: Admitting: Gastroenterology

## 2023-11-02 NOTE — Telephone Encounter (Signed)
 Inbound call from patient, states he would like to speak to a nurse in regards to lab results from previous visit.

## 2023-11-02 NOTE — Telephone Encounter (Signed)
 Spoke with pt and he saw his lab results in [place]. He wants to know where he stands in regards to his liver damage. He also states he is concerned due to always feeling fatigued. Pt also wanted to know if he has been referred to a liver specialist. I did not see any mention of liver specialist. Please advise.

## 2023-11-04 DIAGNOSIS — H04123 Dry eye syndrome of bilateral lacrimal glands: Secondary | ICD-10-CM | POA: Diagnosis not present

## 2023-11-08 ENCOUNTER — Other Ambulatory Visit: Payer: Self-pay

## 2023-11-08 DIAGNOSIS — I6523 Occlusion and stenosis of bilateral carotid arteries: Secondary | ICD-10-CM

## 2023-11-09 NOTE — Telephone Encounter (Signed)
 Referral faxed to [facility].

## 2023-11-11 ENCOUNTER — Telehealth: Payer: Self-pay

## 2023-11-11 NOTE — Telephone Encounter (Signed)
 Patient has appointment with [facility] on 02-25-24 at 9:30am [phone])

## 2023-11-16 ENCOUNTER — Ambulatory Visit (HOSPITAL_COMMUNITY): Payer: Medicare Other | Attending: Vascular Surgery

## 2023-11-16 ENCOUNTER — Ambulatory Visit: Payer: Medicare Other | Admitting: Vascular Surgery

## 2023-11-20 ENCOUNTER — Other Ambulatory Visit: Payer: Self-pay | Admitting: Cardiovascular Disease

## 2023-12-01 DIAGNOSIS — C778 Secondary and unspecified malignant neoplasm of lymph nodes of multiple regions: Secondary | ICD-10-CM | POA: Diagnosis not present

## 2023-12-01 DIAGNOSIS — R3912 Poor urinary stream: Secondary | ICD-10-CM | POA: Diagnosis not present

## 2023-12-09 ENCOUNTER — Encounter: Payer: Self-pay | Admitting: Internal Medicine

## 2023-12-10 ENCOUNTER — Ambulatory Visit: Payer: Medicare Other | Attending: Cardiovascular Disease | Admitting: Cardiovascular Disease

## 2023-12-10 VITALS — BP 88/59 | HR 69 | Ht 68.0 in | Wt 194.6 lb

## 2023-12-10 DIAGNOSIS — I2510 Atherosclerotic heart disease of native coronary artery without angina pectoris: Secondary | ICD-10-CM | POA: Diagnosis not present

## 2023-12-10 DIAGNOSIS — I6523 Occlusion and stenosis of bilateral carotid arteries: Secondary | ICD-10-CM | POA: Diagnosis not present

## 2023-12-10 DIAGNOSIS — E785 Hyperlipidemia, unspecified: Secondary | ICD-10-CM | POA: Diagnosis not present

## 2023-12-10 DIAGNOSIS — I358 Other nonrheumatic aortic valve disorders: Secondary | ICD-10-CM

## 2023-12-10 NOTE — Progress Notes (Signed)
 Cardiology Office Note   Date:  12/10/2023   ID:  Larry Kent, DOB 07/23/1945, MRN [id number]  PCP:  Larry Crick, MD  Cardiologist:   Larry Kirks, MD   Chief Complaint  Patient presents with   Follow-up    No complaints      History of Present Illness: Larry Kent is a 78 y.o. male who is here today for a follow-up visit regarding coronary artery disease, carotid disease and mild aortic stenosis.   The patient is status post right carotid endarterectomy in June 2021 for severe asymptomatic stenosis. He has chronic medical conditions that include hyperlipidemia, aortic stenosis, prostate cancer and lung nodules with pathology showing neuroendocrine carcinoma.  He presented in March 2024 to the cancer center with chest pain and shortness of breath.  EKG showed evidence of inferior ST elevation.  He was transferred to [facility] and underwent emergent cardiac catheterization which showed thrombotic occlusion of the right coronary artery which was treated with 4 overlapped drug-eluting stents.  There was 50% distal left main stenosis, 60% proximal left circumflex stenosis and 60% mid LAD stenosis.  Echocardiogram showed normal LV systolic function.  He underwent a follow-up PET scan in June, 2024 which showed no evidence of ischemia with normal ejection fraction.   He was seen in December 2020 for worsening lower extremity edema and was prescribed furosemide  to be used as needed.  Lower extremity edema improved and he has not required furosemide  recently.  He has been doing reasonably well and denies chest pain.  He does report continued shortness of breath and fatigue.  He is on hormone therapy for prostate cancer.  He is noted to be hypotensive today but he denies dizziness.  He does admit to decreased oral hydration.  Past Medical History:  Diagnosis Date   Allergy    Arthritis    both knees    BCC (basal cell carcinoma), arm, right 09/2019   MOHS  ([place])   BCC (basal cell carcinoma), face 2014   L preauricular s/p MOHS   BCC (basal cell carcinoma), face 02/2018   L upper lip   BPH (benign prostatic hypertrophy)    Dr. Derrick Fling @ [facility]   Carotid stenosis 05/2015   RICA 40-59%, LICA 1-39%, L vertebral occlusion, rpt 1 yr   Cataract    removed both eyes    Diabetes mellitus without complication (HCC)    FHx: colon cancer    FHx: prostate cancer    Heart murmur    mild aortic stenosis    History of kidney stones    Hyperlipidemia    borderline- on rosuvastatin  now normal    Occlusion of right vertebral artery 05/2015    Past Surgical History:  Procedure Laterality Date   BASAL CELL CARCINOMA EXCISION  05/2018   lip, 2020 x3  basal cells removed    CATARACT EXTRACTION  2008   Left   CATARACT EXTRACTION  2013   Right ([place])   COLONOSCOPY  ~2010   medium int hemorrhoids, o/w WNL, rpt 5 yrs given fmhx (Dr. Denece Finger)   COLONOSCOPY  08/2019   TAs, diverticulosis, rpt 3 yrs ([place])   CORONARY/GRAFT ACUTE MI REVASCULARIZATION N/A 09/07/2022   Procedure: Coronary/Graft Acute MI Revascularization;  Surgeon: Kyra Phy, MD;  Location: [facility];  Service: Cardiovascular;  Laterality: N/A;   ENDARTERECTOMY Right 12/01/2019   Procedure: RIGHT ENDARTERECTOMY CAROTID;  Surgeon: Young Hensen, MD;  Location: [facility] OR;  Service:  Vascular;  Laterality: Right;   exercise treadmill  11/2005   WNL [facility])   FINE NEEDLE ASPIRATION  07/28/2022   Procedure: FINE NEEDLE ASPIRATION (FNA) LINEAR;  Surgeon: Prudy Brownie, DO;  Location: [facility] ENDOSCOPY;  Service: Pulmonary;;   HERNIA REPAIR  2008   Right   IR IMAGING GUIDED PORT INSERTION  08/11/2022   IR IMAGING GUIDED PORT INSERTION  10/08/2022   IR REMOVAL TUN ACCESS W/ PORT W/O FL MOD SED  09/10/2022   LEFT HEART CATH AND CORONARY ANGIOGRAPHY N/A 09/07/2022   Procedure: LEFT HEART CATH AND CORONARY ANGIOGRAPHY;  Surgeon: Kyra Phy, MD;  Location: [facility]  CV LAB;  Service: Cardiovascular;  Laterality: N/A;   MOHS SURGERY Left spring 2014   basal cell face   PATCH ANGIOPLASTY Right 12/01/2019   Procedure: PATCH ANGIOPLASTY USING Corinna Dickens BIOLOGIC PATCH;  Surgeon: Young Hensen, MD;  Location: [facility] OR;  Service: Vascular;  Laterality: Right;   Testicular Biopsy  2003   benign, varicocele   US  ECHOCARDIOGRAPHY  11/2005   aortic sclerosis, EF 55-60%, diastolic dysfunction   VIDEO BRONCHOSCOPY WITH ENDOBRONCHIAL ULTRASOUND Bilateral 07/28/2022   Procedure: VIDEO BRONCHOSCOPY WITH ENDOBRONCHIAL ULTRASOUND;  Surgeon: Prudy Brownie, DO;  Location: [facility] ENDOS[facility];  Service: Pulmonary;  Laterality: Bilateral;     Current Outpatient Medications  Medication Sig Dispense Refill   Accu-Chek Softclix Lancets lancets SMARTSIG:Topical     acetaminophen  (TYLENOL ) 500 MG tablet Take 1,000 mg by mouth every 6 (six) hours as needed for mild pain or headache.     aspirin  EC 81 MG tablet Take 1 tablet (81 mg total) by mouth daily. Swallow whole.     Blood Glucose Monitoring Suppl DEVI 1 each by Does not apply route in the morning, at noon, and at bedtime. E11.69. May substitute to any manufacturer covered by patient's insurance. 1 each 0   Calcium  Carb-Cholecalciferol (RA CALCIUM  PLUS VITAMIN D) 600-10 MG-MCG TABS Take 600 mg by mouth daily.     clopidogrel  (PLAVIX ) 75 MG tablet Take 1 tablet (75 mg total) by mouth daily. 90 tablet 1   fluticasone  (FLONASE ) 50 MCG/ACT nasal spray Place 1 spray into both nostrils daily as needed for allergies or rhinitis.     furosemide  (LASIX ) 20 MG tablet Take 1 tablet (20 mg total) by mouth as needed (take if increase in weight  of 2-3 lbs overnight or 8 lbs in a week). 90 tablet 3   glipiZIDE  (GLUCOTROL ) 5 MG tablet TAKE 1 TABLET (5 MG) BY MOUTH DAILY WITH BREAKFAST. AS NEEDED FOR HIGH SUGARS WITH PREDNISONE  USE 90 tablet 1   lidocaine -prilocaine  (EMLA ) cream Apply to the Port-A-Cath site 30-60 minutes before chemotherapy  treatment 30 g 0   NUBEQA 300 MG tablet Take 600 mg by mouth 2 (two) times daily.     pantoprazole  (PROTONIX ) 20 MG tablet TAKE 1 TABLET BY MOUTH EVERY DAY (Patient taking differently: Take 20 mg by mouth daily. Taking when needed) 90 tablet 1   potassium chloride  (KLOR-CON ) 10 MEQ tablet Take 1 tablet (10 mEq total) by mouth as needed. (Patient taking differently: Take 10 mEq by mouth daily.) 90 tablet 3   rosuvastatin  (CRESTOR ) 5 MG tablet Take 1 tablet (5 mg total) by mouth daily. 90 tablet 1   tamsulosin  (FLOMAX ) 0.4 MG CAPS capsule Take 0.4 mg by mouth 2 (two) times daily.     abiraterone acetate (ZYTIGA) 250 MG tablet Take 1,000 mg by mouth daily. (Patient not taking: Reported on  12/10/2023)     Multiple Vitamin (MULTIVITAMIN) tablet Take 1 tablet by mouth daily with breakfast. (Patient not taking: Reported on 12/10/2023)     OVER THE COUNTER MEDICATION Xtendi darolomipe pt taking 250 mg (Patient not taking: Reported on 10/07/2023)     predniSONE  (DELTASONE ) 5 MG tablet Take 1 tablet (5 mg total) by mouth 2 (two) times daily with a meal.     prochlorperazine  (COMPAZINE ) 10 MG tablet Take 1 tablet (10 mg total) by mouth every 6 (six) hours as needed for nausea or vomiting. (Patient not taking: Reported on 12/10/2023) 30 tablet 0   Zinc 25 MG TABS Take 25 mg by mouth daily. (Patient not taking: Reported on 12/10/2023)     No current facility-administered medications for this visit.    Allergies:   Patient has no known allergies.    Social History:  The patient  reports that he has never smoked. He has never used smokeless tobacco. He reports that he does not currently use alcohol . He reports that he does not use drugs.   Family History:  The patient's family history includes Breast cancer in his sister; Cancer (age of onset: 64) in his sister; Cancer (age of onset: 91) in his paternal grandfather; Cancer (age of onset: 47) in his father; Cancer (age of onset: 75) in his mother; Colon cancer in  his maternal grandmother and mother; Coronary artery disease in his maternal uncle; Coronary artery disease (age of onset: 76) in his father; Hypertension in his mother; Prostate cancer in his father and paternal grandfather; Stroke in his mother.    ROS:  Please see the history of present illness.   Otherwise, review of systems are positive for none.   All other systems are reviewed and negative.    PHYSICAL EXAM: VS:  BP (!) 88/59 (BP Location: Right Arm, Cuff Size: Normal)   Pulse 69   Ht 5' 8 (1.727 m)   Wt 194 lb 9.6 oz (88.3 kg)   SpO2 97%   BMI 29.59 kg/m  , BMI Body mass index is 29.59 kg/m. GEN: Well nourished, well developed, in no acute distress  HEENT: normal  Neck: no JVD or masses.  Left carotid bruit.   Cardiac: RRR; no  rubs, or gallops,no edema .  2 / 6 systolic murmur in the aortic area which is early to mid peaking. Respiratory:  clear to auscultation bilaterally, normal work of breathing GI: soft, nontender, nondistended, + BS MS: no deformity or atrophy  Skin: warm and dry, no rash Neuro:  Strength and sensation are intact Psych: euthymic mood, full affect   EKG:  EKG is not ordered today. EKG showed: Normal sinus rhythm Inferior infarct (cited on or before 07-Sep-2022) When compared with ECG of 29-Oct-2022 18:34, No significant change was found    Recent Labs: 10/01/2023: TSH 1.72 10/19/2023: ALT 17; BUN 15; Creatinine 0.94; Hemoglobin 12.2; Platelet Count 156; Potassium 4.0; Sodium 139    Lipid Panel    Component Value Date/Time   CHOL 139 12/22/2022 0811   CHOL 128 09/12/2018 1118   TRIG 63.0 12/22/2022 0811   HDL 62.50 12/22/2022 0811   HDL 57 09/12/2018 1118   CHOLHDL 2 12/22/2022 0811   VLDL 12.6 12/22/2022 0811   LDLCALC 64 12/22/2022 0811   LDLCALC 57 09/12/2018 1118   LDLDIRECT 144.8 02/09/2012 0928      Wt Readings from Last 3 Encounters:  12/10/23 194 lb 9.6 oz (88.3 kg)  10/26/23 192 lb 14.4 oz (87.5  kg)  10/07/23 196 lb (88.9  kg)          08/02/2018    9:04 AM  PAD Screen  Previous PAD dx? No  Previous surgical procedure? No  Pain with walking? No  Feet/toe relief with dangling? No  Painful, non-healing ulcers? No  Extremities discolored? No      ASSESSMENT AND PLAN:  1.  Coronary artery disease involving native coronary arteries without angina: He is doing well with no recurrent angina.  Due to 4 overlapped stents, he is on long-term dual antiplatelet therapy as tolerated.  2.  Carotid artery disease: Status post right carotid endarterectomy in 2021.  He is followed by [facility].  3.  Hyperlipidemia: Continue treatment with rosuvastatin .  Most recent lipid profile showed an LDL of 64.  He did have abnormal liver enzymes in December but that was likely related to his cancer drugs.  He has a follow-up appointment with GI.  4.  Aortic stenosis: This remains mild on most recent echocardiogram in March.  Recommend a follow-up echocardiogram in March 2026  5.  Metastatic neuroendocrine carcinoma with advanced prostate cancer.  On hormone therapy.  6.  Hypotension: He is asymptomatic.  I asked him to increase oral hydration.  I elected to discontinue metoprolol .    Disposition:   FU with me in 6 months  Signed,  Larry Kirks, MD  12/10/2023 11:09 AM    [facility]

## 2023-12-10 NOTE — Patient Instructions (Addendum)
 Medication Instructions:  STOP the Metoprolol  *If you need a refill on your cardiac medications before your next appointment, please call your pharmacy*  Lab Work: None ordered If you have labs (blood work) drawn today and your tests are completely normal, you will receive your results only by: MyChart Message (if you have MyChart) OR A paper copy in the mail If you have any lab test that is abnormal or we need to change your treatment, we will call you to review the results.  Testing/Procedures: None ordered  Follow-Up: At [facility], you and your health needs are our priority.  As part of our continuing mission to provide you with exceptional heart care, our providers are all part of one team.  This team includes your primary Cardiologist (physician) and Advanced Practice Providers or APPs (Physician Assistants and Nurse Practitioners) who all work together to provide you with the care you need, when you need it.  Your next appointment:   6 month(s)  Provider:   You may see Dr. Alvenia Aus  or one of the following Advanced Practice Providers on your designated Care Team:   Laneta Pintos, NP Gildardo Labrador, PA-C Varney Gentleman, PA-C Cadence Holland [place], PA-C Ronald Cockayne, NP Morey Ar, NP    We recommend signing up for the patient portal called MyChart.  Sign up information is provided on this After Visit Summary.  MyChart is used to connect with patients for Virtual Visits (Telemedicine).  Patients are able to view lab/test results, encounter notes, upcoming appointments, etc.  Non-urgent messages can be sent to your provider as well.   To learn more about what you can do with MyChart, go to [email or web address].

## 2023-12-13 DIAGNOSIS — G5603 Carpal tunnel syndrome, bilateral upper limbs: Secondary | ICD-10-CM | POA: Diagnosis not present

## 2023-12-17 ENCOUNTER — Encounter: Admitting: Gastroenterology

## 2023-12-28 HISTORY — PX: COLONOSCOPY: SHX174

## 2024-01-03 ENCOUNTER — Telehealth: Payer: Self-pay | Admitting: Gastroenterology

## 2024-01-03 NOTE — Telephone Encounter (Signed)
 RN returned call to patient who had questions about how to complete his colon prep. RN reviewed instructions with patient and told patient those instructions would be sent to his My Chart, as he requested.  All questions regarding prep were answered. Patient stated understanding.

## 2024-01-03 NOTE — Telephone Encounter (Signed)
 Patient called and stated that he has a procedure scheduled for tomorrow with Dr. Leigh. Patient is requesting to speak to the nurse in regards on how he should take his prep solution for his colonoscopy. Patient is requesting a call back. Please advise.

## 2024-01-03 NOTE — Telephone Encounter (Signed)
 Left a detailed message for the patient regarding the times to take his Suprep and 5 day hold of Plavix . Left message for patient to call with any further questions or concerns.

## 2024-01-04 ENCOUNTER — Ambulatory Visit: Admitting: Gastroenterology

## 2024-01-04 ENCOUNTER — Encounter: Payer: Self-pay | Admitting: Gastroenterology

## 2024-01-04 VITALS — BP 116/62 | HR 60 | Temp 98.0°F | Resp 12 | Ht 68.0 in | Wt 199.0 lb

## 2024-01-04 DIAGNOSIS — D123 Benign neoplasm of transverse colon: Secondary | ICD-10-CM

## 2024-01-04 DIAGNOSIS — K648 Other hemorrhoids: Secondary | ICD-10-CM | POA: Diagnosis not present

## 2024-01-04 DIAGNOSIS — E119 Type 2 diabetes mellitus without complications: Secondary | ICD-10-CM | POA: Diagnosis not present

## 2024-01-04 DIAGNOSIS — K5730 Diverticulosis of large intestine without perforation or abscess without bleeding: Secondary | ICD-10-CM

## 2024-01-04 DIAGNOSIS — Z7901 Long term (current) use of anticoagulants: Secondary | ICD-10-CM | POA: Diagnosis not present

## 2024-01-04 DIAGNOSIS — I252 Old myocardial infarction: Secondary | ICD-10-CM | POA: Diagnosis not present

## 2024-01-04 DIAGNOSIS — Z1211 Encounter for screening for malignant neoplasm of colon: Secondary | ICD-10-CM | POA: Diagnosis not present

## 2024-01-04 DIAGNOSIS — Z860100 Personal history of colon polyps, unspecified: Secondary | ICD-10-CM

## 2024-01-04 MED ORDER — SODIUM CHLORIDE 0.9 % IV SOLN: 500.0000 mL | Freq: Once | INTRAVENOUS | Status: DC

## 2024-01-04 NOTE — Progress Notes (Signed)
 Report to PACU, RN, vss, BBS= Clear.

## 2024-01-04 NOTE — Op Note (Signed)
 [facility] Patient Name: Larry Kent Procedure Date: 01/04/2024 11:10 AM MRN: [id number] Endoscopist: Elspeth P. Leigh , MD, [id number] Age: 78 Referring MD:  Date of Birth: Sep 01, 1945 Gender: Male Account #: [id number] Procedure:                Colonoscopy Indications:              High risk colon cancer surveillance: Personal                            history of colonic polyps - 5 adenomas removed                            08/2019, mother had colon cancer. Medicines:                Monitored Anesthesia Care Procedure:                Pre-Anesthesia Assessment:                           - Prior to the procedure, a History and Physical                            was performed, and patient medications and                            allergies were reviewed. The patient's tolerance of                            previous anesthesia was also reviewed. The risks                            and benefits of the procedure and the sedation                            options and risks were discussed with the patient.                            All questions were answered, and informed consent                            was obtained. Prior Anticoagulants: The patient has                            taken Plavix  (clopidogrel ), last dose was 5 days                            prior to procedure. ASA Grade Assessment: III - A                            patient with severe systemic disease. After                            reviewing the risks and benefits, the patient was  deemed in satisfactory condition to undergo the                            procedure.                           After obtaining informed consent, the colonoscope                            was passed under direct vision. Throughout the                            procedure, the patient's blood pressure, pulse, and                            oxygen saturations were monitored continuously. The                             Olympus CF-HQ190L ([id number]) Colonoscope was                            introduced through the anus and advanced to the the                            cecum, identified by appendiceal orifice and                            ileocecal valve. The colonoscopy was performed                            without difficulty. The patient tolerated the                            procedure well. The quality of the bowel                            preparation was adequate. The ileocecal valve,                            appendiceal orifice, and rectum were photographed. Scope In: 11:23:46 AM Scope Out: 11:44:08 AM Scope Withdrawal Time: 0 hours 15 minutes 9 seconds  Total Procedure Duration: 0 hours 20 minutes 22 seconds  Findings:                 The perianal and digital rectal examinations were                            normal.                           Multiple small-mouthed diverticula were found in                            the left colon.  Two sessile polyps were found in the transverse                            colon. The polyps were diminutive in size. These                            polyps were removed with a cold snare. Resection                            and retrieval were complete.                           Internal hemorrhoids were found.                           The exam was otherwise without abnormality.                            Retroflexed views of the rectum were limited due to                            small size of the rectum. Complications:            No immediate complications. Estimated blood loss:                            Minimal. Estimated Blood Loss:     Estimated blood loss was minimal. Impression:               - Diverticulosis in the left colon.                           - Two diminutive polyps in the transverse colon,                            removed with a cold snare. Resected and retrieved.                            - Internal hemorrhoids.                           - The examination was otherwise normal. Recommendation:           - Patient has a contact number available for                            emergencies. The signs and symptoms of potential                            delayed complications were discussed with the                            patient. Return to normal activities tomorrow.                            Written discharge instructions were provided  to the                            patient.                           - Resume previous diet.                           - Continue present medications.                           - Resume Plavix  tomorrow.                           - Await pathology results. Elspeth P. Maudie Shingledecker, MD 01/04/2024 11:48:29 AM This report has been signed electronically.

## 2024-01-04 NOTE — Patient Instructions (Signed)
   Resume Plavix  tomorrow ( 01/05/24)  Handouts on polyps,diverticulosis,& hemorrhoids given to you today.   Await pathology results on polyps removed     YOU HAD AN ENDOSCOPIC PROCEDURE TODAY AT THE [facility]:   Refer to the procedure report that was given to you for any specific questions about what was found during the examination.  If the procedure report does not answer your questions, please call your gastroenterologist to clarify.  If you requested that your care partner not be given the details of your procedure findings, then the procedure report has been included in a sealed envelope for you to review at your convenience later.  YOU SHOULD EXPECT: Some feelings of bloating in the abdomen. Passage of more gas than usual.  Walking can help get rid of the air that was put into your GI tract during the procedure and reduce the bloating. If you had a lower endoscopy (such as a colonoscopy or flexible sigmoidoscopy) you may notice spotting of blood in your stool or on the toilet paper. If you underwent a bowel prep for your procedure, you may not have a normal bowel movement for a few days.  Please Note:  You might notice some irritation and congestion in your nose or some drainage.  This is from the oxygen used during your procedure.  There is no need for concern and it should clear up in a day or so.  SYMPTOMS TO REPORT IMMEDIATELY:  Following lower endoscopy (colonoscopy or flexible sigmoidoscopy):  Excessive amounts of blood in the stool  Significant tenderness or worsening of abdominal pains  Swelling of the abdomen that is new, acute  Fever of 100F or higher   For urgent or emergent issues, a gastroenterologist can be reached at any hour by calling [phone]. Do not use MyChart messaging for urgent concerns.    DIET:  We do recommend a small meal at first, but then you may proceed to your regular diet.  Drink plenty of fluids but you should avoid alcoholic  beverages for 24 hours.  ACTIVITY:  You should plan to take it easy for the rest of today and you should NOT DRIVE or use heavy machinery until tomorrow (because of the sedation medicines used during the test).    FOLLOW UP: Our staff will call the number listed on your records the next business day following your procedure.  We will call around 7:15- 8:00 am to check on you and address any questions or concerns that you may have regarding the information given to you following your procedure. If we do not reach you, we will leave a message.     If any biopsies were taken you will be contacted by phone or by letter within the next 1-3 weeks.  Please call us  at [phone] if you have not heard about the biopsies in 3 weeks.    SIGNATURES/CONFIDENTIALITY: You and/or your care partner have signed paperwork which will be entered into your electronic medical record.  These signatures attest to the fact that that the information above on your After Visit Summary has been reviewed and is understood.  Full responsibility of the confidentiality of this discharge information lies with you and/or your care-partner.

## 2024-01-04 NOTE — Progress Notes (Signed)
 Denies chest pain and not on home oxygen.    I have reviewed the patient's medical history in detail and updated the computerized patient record.

## 2024-01-04 NOTE — Progress Notes (Signed)
 Called to room to assist during endoscopic procedure.  Patient ID and intended procedure confirmed with present staff. Received instructions for my participation in the procedure from the performing physician.

## 2024-01-04 NOTE — Progress Notes (Signed)
 [facility] History and Physical   Primary Care Physician:  [name], MD   Reason for Procedure:   History of colon polyps  Plan:    colonoscopy     HPI: Larry Kent is a 78 y.o. male  here for colonoscopy surveillance - 5 adenomas removed 08/2019. History of cirrhosis and metastatic neuroendocrine tumor of unclear etiology followed by Oncology and on Infinzi. He completed therapy, states his malignancy is stable. He strongly wanted to proceed with surveillance at office visit previously, mother had colon cancer. On Plavix , history of CAD, held for 5 days.   . Patient denies any bowel symptoms at this time. . Otherwise feels well without any cardiopulmonary symptoms.   I have discussed risks / benefits of anesthesia and endoscopic procedure with Larry Kent and they wish to proceed with the exams as outlined today.    Past Medical History:  Diagnosis Date   Allergy    Arthritis    both knees    BCC (basal cell carcinoma), arm, right 09/2019   MOHS ([place])   BCC (basal cell carcinoma), face 2014   L preauricular s/p MOHS   BCC (basal cell carcinoma), face 02/2018   L upper lip   BPH (benign prostatic hypertrophy)    Dr. Nieves @ [facility]   Carotid stenosis 05/2015   RICA 40-59%, LICA 1-39%, L vertebral occlusion, rpt 1 yr   Cataract    removed both eyes    Diabetes mellitus without complication (HCC)    FHx: colon cancer    FHx: prostate cancer    Heart murmur    mild aortic stenosis    History of kidney stones    Hyperlipidemia    borderline- on rosuvastatin  now normal    Myocardial infarction (HCC) 09/07/2022   Occlusion of right vertebral artery 05/2015    Past Surgical History:  Procedure Laterality Date   BASAL CELL CARCINOMA EXCISION  05/2018   lip, 2020 x3  basal cells removed    CATARACT EXTRACTION  2008   Left   CATARACT EXTRACTION  2013   Right (Eppes)   COLONOSCOPY  ~2010   medium int hemorrhoids, o/w WNL, rpt 5 yrs  given fmhx (Dr. Celestia)   COLONOSCOPY  08/2019   TAs, diverticulosis, rpt 3 yrs (Alonnie Bieker)   CORONARY/GRAFT ACUTE MI REVASCULARIZATION N/A 09/07/2022   Procedure: Coronary/Graft Acute MI Revascularization;  Surgeon: Wendel Lurena POUR, MD;  [facility];  Service: Cardiovascular;  Laterality: N/A;   ENDARTERECTOMY Right 12/01/2019   Procedure: RIGHT ENDARTERECTOMY CAROTID;  Surgeon: Gretta Lonni PARAS, MD;  Location: [facility] OR;  Service: Vascular;  Laterality: Right;   exercise treadmill  11/2005   WNL [place])   FINE NEEDLE ASPIRATION  07/28/2022   Procedure: FINE NEEDLE ASPIRATION (FNA) LINEAR;  Surgeon: Brenna Adine LITTIE, DO;  [facility]: [facility];  Service: Pulmonary;;   HERNIA REPAIR  2008   Right   IR IMAGING GUIDED PORT INSERTION  08/11/2022   IR IMAGING GUIDED PORT INSERTION  10/08/2022   IR REMOVAL TUN ACCESS W/ PORT W/O FL MOD SED  09/10/2022   LEFT HEART CATH AND CORONARY ANGIOGRAPHY N/A 09/07/2022   Procedure: LEFT HEART CATH AND CORONARY ANGIOGRAPHY;  Surgeon: Wendel Lurena POUR, MD;  [facility];  Service: Cardiovascular;  Laterality: N/A;   MOHS SURGERY Left spring 2014   basal cell face   PATCH ANGIOPLASTY Right 12/01/2019   Procedure: PATCH ANGIOPLASTY USING XENOSURE BIOLOGIC PATCH;  Surgeon:  Gretta Lonni PARAS, MD;  Location: [facility] OR;  Service: Vascular;  Laterality: Right;   Testicular Biopsy  2003   benign, varicocele   US  ECHOCARDIOGRAPHY  11/2005   aortic sclerosis, EF 55-60%, diastolic dysfunction   VIDEO BRONCHOSCOPY WITH ENDOBRONCHIAL ULTRASOUND Bilateral 07/28/2022   Procedure: VIDEO BRONCHOSCOPY WITH ENDOBRONCHIAL ULTRASOUND;  Surgeon: Brenna Adine CROME, DO;  [facility];  Service: Pulmonary;  Laterality: Bilateral;    Prior to Admission medications   Medication Sig Start Date End Date Taking? Authorizing Provider  Accu-Chek Softclix Lancets lancets SMARTSIG:Topical 06/29/23  Yes [provider]  aspirin  EC 81 MG  tablet Take 1 tablet (81 mg total) by mouth daily. Swallow whole. 08/12/23  Yes Darron Deatrice LABOR, MD  Blood Glucose Monitoring Suppl DEVI 1 each by Does not apply route in the morning, at noon, and at bedtime. E11.69. May substitute to any manufacturer covered by patient's insurance. 06/29/23  Yes [name], MD  Calcium  Carb-Cholecalciferol (RA CALCIUM  PLUS VITAMIN D) 600-10 MG-MCG TABS Take 600 mg by mouth daily.   Yes [provider]  Lancets Misc. (ACCU-CHEK SOFTCLIX LANCET DEV) KIT USE TO CHECK BLOOD SUGAR IN THE MORNING, AT NOON, AND AT BEDTIME 08/22/23  Yes [provider]  NUBEQA 300 MG tablet Take 600 mg by mouth 2 (two) times daily. 09/13/23  Yes [provider]  tamsulosin  (FLOMAX ) 0.4 MG CAPS capsule Take 0.4 mg by mouth 2 (two) times daily.   Yes [provider]  abiraterone acetate (ZYTIGA) 250 MG tablet Take 1,000 mg by mouth daily. Patient not taking: Reported on 12/10/2023 10/01/22   Nieves Cough, MD  acetaminophen  (TYLENOL ) 500 MG tablet Take 1,000 mg by mouth every 6 (six) hours as needed for mild pain or headache.    [provider]  clopidogrel  (PLAVIX ) 75 MG tablet Take 1 tablet (75 mg total) by mouth daily. 09/28/23   Darron Deatrice LABOR, MD  fluticasone  (FLONASE ) 50 MCG/ACT nasal spray Place 1 spray into both nostrils daily as needed for allergies or rhinitis.    [provider]  furosemide  (LASIX ) 20 MG tablet Take 1 tablet (20 mg total) by mouth as needed (take if increase in weight  of 2-3 lbs overnight or 8 lbs in a week). 06/09/23 12/10/23  Emelia Josefa HERO, NP  glipiZIDE  (GLUCOTROL ) 5 MG tablet TAKE 1 TABLET (5 MG) BY MOUTH DAILY WITH BREAKFAST. AS NEEDED FOR HIGH SUGARS WITH PREDNISONE  USE 09/27/23   [name], MD  lidocaine -prilocaine  (EMLA ) cream Apply to the Port-A-Cath site 30-60 minutes before chemotherapy treatment 08/03/22   Sherrod Sherrod, MD  Multiple Vitamin (MULTIVITAMIN) tablet Take 1 tablet by  mouth daily with breakfast. Patient not taking: No sig reported    [provider]  OVER THE COUNTER MEDICATION Xtendi darolomipe pt taking 250 mg Patient not taking: Reported on 10/07/2023    [provider]  pantoprazole  (PROTONIX ) 20 MG tablet TAKE 1 TABLET BY MOUTH EVERY DAY Patient taking differently: Take 20 mg by mouth daily. Taking when needed 09/06/23   Heilingoetter, Cassandra L, PA-C  potassium chloride  (KLOR-CON ) 10 MEQ tablet Take 1 tablet (10 mEq total) by mouth as needed. Patient taking differently: Take 10 mEq by mouth daily. 06/09/23 12/10/23  Emelia Josefa HERO, NP  predniSONE  (DELTASONE ) 5 MG tablet Take 1 tablet (5 mg total) by mouth 2 (two) times daily with a meal. 08/10/23   [name], MD  prochlorperazine  (COMPAZINE ) 10 MG tablet Take 1 tablet (10 mg total) by mouth  every 6 (six) hours as needed for nausea or vomiting. Patient not taking: Reported on 12/10/2023 10/12/22   Sherrod Sherrod, MD  rosuvastatin  (CRESTOR ) 5 MG tablet Take 1 tablet (5 mg total) by mouth daily. 08/26/23   Darron Deatrice LABOR, MD  Zinc 25 MG TABS Take 25 mg by mouth daily. Patient not taking: Reported on 12/10/2023    [provider]    Current Outpatient Medications  Medication Sig Dispense Refill   Accu-Chek Softclix Lancets lancets SMARTSIG:Topical     aspirin  EC 81 MG tablet Take 1 tablet (81 mg total) by mouth daily. Swallow whole.     Blood Glucose Monitoring Suppl DEVI 1 each by Does not apply route in the morning, at noon, and at bedtime. E11.69. May substitute to any manufacturer covered by patient's insurance. 1 each 0   Calcium  Carb-Cholecalciferol (RA CALCIUM  PLUS VITAMIN D) 600-10 MG-MCG TABS Take 600 mg by mouth daily.     Lancets Misc. (ACCU-CHEK SOFTCLIX LANCET DEV) KIT USE TO CHECK BLOOD SUGAR IN THE MORNING, AT NOON, AND AT BEDTIME     NUBEQA 300 MG tablet Take 600 mg by mouth 2 (two) times daily.     tamsulosin  (FLOMAX ) 0.4 MG CAPS capsule Take 0.4 mg by  mouth 2 (two) times daily.     abiraterone acetate (ZYTIGA) 250 MG tablet Take 1,000 mg by mouth daily. (Patient not taking: Reported on 12/10/2023)     acetaminophen  (TYLENOL ) 500 MG tablet Take 1,000 mg by mouth every 6 (six) hours as needed for mild pain or headache.     clopidogrel  (PLAVIX ) 75 MG tablet Take 1 tablet (75 mg total) by mouth daily. 90 tablet 1   fluticasone  (FLONASE ) 50 MCG/ACT nasal spray Place 1 spray into both nostrils daily as needed for allergies or rhinitis.     furosemide  (LASIX ) 20 MG tablet Take 1 tablet (20 mg total) by mouth as needed (take if increase in weight  of 2-3 lbs overnight or 8 lbs in a week). 90 tablet 3   glipiZIDE  (GLUCOTROL ) 5 MG tablet TAKE 1 TABLET (5 MG) BY MOUTH DAILY WITH BREAKFAST. AS NEEDED FOR HIGH SUGARS WITH PREDNISONE  USE 90 tablet 1   lidocaine -prilocaine  (EMLA ) cream Apply to the Port-A-Cath site 30-60 minutes before chemotherapy treatment 30 g 0   Multiple Vitamin (MULTIVITAMIN) tablet Take 1 tablet by mouth daily with breakfast. (Patient not taking: No sig reported)     OVER THE COUNTER MEDICATION Xtendi darolomipe pt taking 250 mg (Patient not taking: Reported on 10/07/2023)     pantoprazole  (PROTONIX ) 20 MG tablet TAKE 1 TABLET BY MOUTH EVERY DAY (Patient taking differently: Take 20 mg by mouth daily. Taking when needed) 90 tablet 1   potassium chloride  (KLOR-CON ) 10 MEQ tablet Take 1 tablet (10 mEq total) by mouth as needed. (Patient taking differently: Take 10 mEq by mouth daily.) 90 tablet 3   predniSONE  (DELTASONE ) 5 MG tablet Take 1 tablet (5 mg total) by mouth 2 (two) times daily with a meal.     prochlorperazine  (COMPAZINE ) 10 MG tablet Take 1 tablet (10 mg total) by mouth every 6 (six) hours as needed for nausea or vomiting. (Patient not taking: Reported on 12/10/2023) 30 tablet 0   rosuvastatin  (CRESTOR ) 5 MG tablet Take 1 tablet (5 mg total) by mouth daily. 90 tablet 1   Zinc 25 MG TABS Take 25 mg by mouth daily. (Patient not taking:  Reported on 12/10/2023)     Current Facility-Administered Medications  Medication  Dose Route Frequency Provider Last Rate Last Admin   0.9 %  sodium chloride  infusion  500 mL Intravenous Once Raynesha Tiedt, Elspeth SQUIBB, MD        Allergies as of 01/04/2024   (No Known Allergies)    Family History  Problem Relation Age of Onset   Cancer Father 74       prostate   Coronary artery disease Father 49       MI   Prostate cancer Father    Cancer Paternal Grandfather 40       prostate   Prostate cancer Paternal Grandfather    Stroke Mother    Hypertension Mother    Cancer Mother 82       colon   Colon cancer Mother    Coronary artery disease Maternal Uncle    Cancer Sister 24       breast   Breast cancer Sister    Colon cancer Maternal Grandmother    Diabetes Neg Hx    Colon polyps Neg Hx    Esophageal cancer Neg Hx    Rectal cancer Neg Hx    Stomach cancer Neg Hx     Social History   Socioeconomic History   Marital status: Married    Spouse name: Not on file   Number of children: Not on file   Years of education: Not on file   Highest education level: Not on file  Occupational History   Occupation: retired  Tobacco Use   Smoking status: Never   Smokeless tobacco: Never  Vaping Use   Vaping status: Never Used  Substance and Sexual Activity   Alcohol  use: Not Currently    Comment: rare   Drug use: No   Sexual activity: Not on file  Other Topics Concern   Not on file  Social History Narrative   Caffeine: 2-3 cups coffee/day   Lives with wife and adult son, 1 dog   Occupation: Warden/ranger   Edu: college   Activity: golf, no regular exercise, occasionally walks with wife   Diet: good amt water, daily fruits/vegetables, fish several times a week   Social Drivers of Corporate investment banker Strain: Low Risk  (10/07/2023)   Overall Financial Resource Strain (CARDIA)    Difficulty of Paying Living Expenses: Not hard at all  Food Insecurity: No Food  Insecurity (10/07/2023)   Hunger Vital Sign    Worried About Running Out of Food in the Last Year: Never true    Ran Out of Food in the Last Year: Never true  Transportation Needs: No Transportation Needs (10/07/2023)   PRAPARE - Administrator, Civil Service (Medical): No    Lack of Transportation (Non-Medical): No  Physical Activity: Insufficiently Active (10/07/2023)   Exercise Vital Sign    Days of Exercise per Week: 5 days    Minutes of Exercise per Session: 20 min  Stress: No Stress Concern Present (10/07/2023)   [employer] of Occupational Health - Occupational Stress Questionnaire    Feeling of Stress : Not at all  Social Connections: Moderately Integrated (10/07/2023)   Social Connection and Isolation Panel    Frequency of Communication with Friends and Family: More than three times a week    Frequency of Social Gatherings with Friends and Family: Twice a week    Attends Religious Services: More than 4 times per year    Active Member of [employer] or Organizations: No    Attends Banker Meetings:  Never    Marital Status: Married  Catering manager Violence: Not At Risk (10/07/2023)   Humiliation, Afraid, Rape, and Kick questionnaire    Fear of Current or Ex-Partner: No    Emotionally Abused: No    Physically Abused: No    Sexually Abused: No    Review of Systems: All other review of systems negative except as mentioned in the HPI.  Physical Exam: Vital signs BP 118/69   Pulse 64   Temp 98 F (36.7 C) (Temporal)   Ht 5' 8 (1.727 m)   Wt 199 lb (90.3 kg)   SpO2 96%   BMI 30.26 kg/m   General:   Alert,  Well-developed, pleasant and cooperative in NAD Lungs:  Clear throughout to auscultation.   Heart:  Regular rate and rhythm Abdomen:  Soft, nontender and nondistended.   Neuro/Psych:  Alert and cooperative. Normal mood and affect. A and O x 3  Marcey Naval, MD [facility]

## 2024-01-05 ENCOUNTER — Telehealth: Payer: Self-pay

## 2024-01-05 NOTE — Telephone Encounter (Signed)
  Follow up Call-     01/04/2024   10:30 AM  Call back number  Post procedure Call Back phone  # [phone]  Permission to leave phone message Yes     Patient questions:  Do you have a fever, pain , or abdominal swelling? No. Pain Score  0 *  Have you tolerated food without any problems? Yes.    Have you been able to return to your normal activities? Yes.    Do you have any questions about your discharge instructions: Diet   No. Medications  No. Follow up visit  No.  Do you have questions or concerns about your Care? No.  Actions: * If pain score is 4 or above: No action needed, pain <4.

## 2024-01-07 LAB — SURGICAL PATHOLOGY

## 2024-01-08 ENCOUNTER — Ambulatory Visit: Payer: Self-pay | Admitting: Gastroenterology

## 2024-01-10 ENCOUNTER — Other Ambulatory Visit: Payer: Self-pay

## 2024-01-10 DIAGNOSIS — I6523 Occlusion and stenosis of bilateral carotid arteries: Secondary | ICD-10-CM

## 2024-01-12 ENCOUNTER — Encounter: Payer: Self-pay | Admitting: Family Medicine

## 2024-01-12 DIAGNOSIS — R310 Gross hematuria: Secondary | ICD-10-CM | POA: Diagnosis not present

## 2024-01-12 DIAGNOSIS — R300 Dysuria: Secondary | ICD-10-CM | POA: Diagnosis not present

## 2024-01-14 ENCOUNTER — Ambulatory Visit (HOSPITAL_COMMUNITY)
Admission: RE | Admit: 2024-01-14 | Discharge: 2024-01-14 | Disposition: A | Discharge status: Home / Self Care | Source: Ambulatory Visit | Attending: Internal Medicine | Admitting: Internal Medicine

## 2024-01-14 ENCOUNTER — Other Ambulatory Visit: Payer: Self-pay | Admitting: Internal Medicine

## 2024-01-14 ENCOUNTER — Inpatient Hospital Stay: Attending: Internal Medicine

## 2024-01-14 ENCOUNTER — Other Ambulatory Visit

## 2024-01-14 DIAGNOSIS — Z8546 Personal history of malignant neoplasm of prostate: Secondary | ICD-10-CM | POA: Diagnosis not present

## 2024-01-14 DIAGNOSIS — C7B8 Other secondary neuroendocrine tumors: Secondary | ICD-10-CM | POA: Diagnosis not present

## 2024-01-14 DIAGNOSIS — R935 Abnormal findings on diagnostic imaging of other abdominal regions, including retroperitoneum: Secondary | ICD-10-CM | POA: Diagnosis not present

## 2024-01-14 DIAGNOSIS — Z79899 Other long term (current) drug therapy: Secondary | ICD-10-CM | POA: Diagnosis not present

## 2024-01-14 DIAGNOSIS — C3490 Malignant neoplasm of unspecified part of unspecified bronchus or lung: Secondary | ICD-10-CM | POA: Diagnosis not present

## 2024-01-14 DIAGNOSIS — G5603 Carpal tunnel syndrome, bilateral upper limbs: Secondary | ICD-10-CM | POA: Insufficient documentation

## 2024-01-14 DIAGNOSIS — C7A1 Malignant poorly differentiated neuroendocrine tumors: Secondary | ICD-10-CM | POA: Insufficient documentation

## 2024-01-14 DIAGNOSIS — C7951 Secondary malignant neoplasm of bone: Secondary | ICD-10-CM | POA: Diagnosis not present

## 2024-01-14 LAB — CBC WITH DIFFERENTIAL (CANCER CENTER ONLY)
Abs Immature Granulocytes: 0 K/uL (ref 0.00–0.07)
Basophils Absolute: 0.1 K/uL (ref 0.0–0.1)
Basophils Relative: 2 %
Eosinophils Absolute: 0.3 K/uL (ref 0.0–0.5)
Eosinophils Relative: 7 %
HCT: 34.4 % — ABNORMAL LOW (ref 39.0–52.0)
Hemoglobin: 12 g/dL — ABNORMAL LOW (ref 13.0–17.0)
Immature Granulocytes: 0 %
Lymphocytes Relative: 29 %
Lymphs Abs: 1.2 K/uL (ref 0.7–4.0)
MCH: 34.1 pg — ABNORMAL HIGH (ref 26.0–34.0)
MCHC: 34.9 g/dL (ref 30.0–36.0)
MCV: 97.7 fL (ref 80.0–100.0)
Monocytes Absolute: 0.6 K/uL (ref 0.1–1.0)
Monocytes Relative: 14 %
Neutro Abs: 2 K/uL (ref 1.7–7.7)
Neutrophils Relative %: 48 %
Platelet Count: 132 K/uL — ABNORMAL LOW (ref 150–400)
RBC: 3.52 MIL/uL — ABNORMAL LOW (ref 4.22–5.81)
RDW: 13.2 % (ref 11.5–15.5)
WBC Count: 4.1 K/uL (ref 4.0–10.5)
nRBC: 0 % (ref 0.0–0.2)

## 2024-01-14 LAB — CMP (CANCER CENTER ONLY)
ALT: 21 U/L (ref 0–44)
AST: 30 U/L (ref 15–41)
Albumin: 3.6 g/dL (ref 3.5–5.0)
Alkaline Phosphatase: 212 U/L — ABNORMAL HIGH (ref 38–126)
Anion gap: 5 (ref 5–15)
BUN: 17 mg/dL (ref 8–23)
CO2: 27 mmol/L (ref 22–32)
Calcium: 9.1 mg/dL (ref 8.9–10.3)
Chloride: 108 mmol/L (ref 98–111)
Creatinine: 0.95 mg/dL (ref 0.61–1.24)
GFR, Estimated: >60 mL/min (ref >60)
Glucose, Bld: 118 mg/dL — ABNORMAL HIGH (ref 70–99)
Potassium: 3.9 mmol/L (ref 3.5–5.1)
Sodium: 140 mmol/L (ref 135–145)
Total Bilirubin: 0.8 mg/dL (ref 0.0–1.2)
Total Protein: 6.5 g/dL (ref 6.5–8.1)

## 2024-01-14 MED ORDER — IOHEXOL 300 MG/ML  SOLN
100.0000 mL | Freq: Once | INTRAMUSCULAR | Status: AC | PRN
  Administered 2024-01-14: 100 mL via INTRAVENOUS

## 2024-01-15 LAB — PSA, TOTAL AND FREE
PSA, Free Pct: 60.7 %
PSA, Free: 2.49 ng/mL
Prostate Specific Ag, Serum: 4.1 ng/mL — ABNORMAL HIGH (ref 0.0–4.0)

## 2024-01-18 ENCOUNTER — Ambulatory Visit (HOSPITAL_COMMUNITY)
Admission: RE | Admit: 2024-01-18 | Discharge: 2024-01-18 | Disposition: A | Discharge status: Home / Self Care | Source: Ambulatory Visit | Attending: Vascular Surgery | Admitting: Vascular Surgery

## 2024-01-18 ENCOUNTER — Ambulatory Visit: Attending: Vascular Surgery | Admitting: Vascular Surgery

## 2024-01-18 ENCOUNTER — Encounter: Payer: Self-pay | Admitting: Vascular Surgery

## 2024-01-18 VITALS — BP 106/70 | HR 62 | Temp 98.0°F | Resp 18 | Ht 68.0 in | Wt 190.6 lb

## 2024-01-18 DIAGNOSIS — I6523 Occlusion and stenosis of bilateral carotid arteries: Secondary | ICD-10-CM

## 2024-01-18 NOTE — Progress Notes (Signed)
 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  He reports no new neurologic events or other issues over the past year.  He previously underwent a right carotid endarterectomy on 12/01/2019 for an asymptomatic high-grade stenosis. No new stroke or TIA symptoms.  Still undergoing treatment for metastatic poorly differentiated carcinoma suspicious for primary prostate versus primary lung.  Now undergoing hormone therapy as he states this was thought to originated from the prostate.  Past Medical History:  Diagnosis Date   Allergy    Arthritis    both knees    BCC (basal cell carcinoma), arm, right 09/2019   MOHS ([facility])   BCC (basal cell carcinoma), face 2014   L preauricular s/p MOHS   BCC (basal cell carcinoma), face 02/2018   L upper lip   BPH (benign prostatic hypertrophy)    Dr. Nieves @ [facility]   Carotid stenosis 05/2015   RICA 40-59%, LICA 1-39%, L vertebral occlusion, rpt 1 yr   Cataract    removed both eyes    Diabetes mellitus without complication (HCC)    FHx: colon cancer    FHx: prostate cancer    Heart murmur    mild aortic stenosis    History of kidney stones    Hyperlipidemia    borderline- on rosuvastatin  now normal    Myocardial infarction (HCC) 09/07/2022   Occlusion of right vertebral artery 05/2015    Past Surgical History:  Procedure Laterality Date   BASAL CELL CARCINOMA EXCISION  05/2018   lip, 2020 x3  basal cells removed    CATARACT EXTRACTION  2008   Left   CATARACT EXTRACTION  2013   Right (Eppes)   COLONOSCOPY  ~2010   medium int hemorrhoids, o/w WNL, rpt 5 yrs given fmhx (Dr. Celestia)   COLONOSCOPY  08/2019   TAs, diverticulosis, rpt 3 yrs ([place])   COLONOSCOPY  12/2023   2TAs, diverticulosis, int hem, no f/u  needed (Armbruster)   CORONARY/GRAFT ACUTE MI REVASCULARIZATION N/A 09/07/2022   Procedure: Coronary/Graft Acute MI Revascularization;  Surgeon: Wendel Lurena POUR, MD;  [facility];  Service: Cardiovascular;  Laterality: N/A;   ENDARTERECTOMY Right 12/01/2019   Procedure: RIGHT ENDARTERECTOMY CAROTID;  Surgeon: Gretta Lonni PARAS, MD;  Location: [facility] OR;  Service: Vascular;  Laterality: Right;   exercise treadmill  11/2005   WNL ([place])   FINE NEEDLE ASPIRATION  07/28/2022   Procedure: FINE NEEDLE ASPIRATION (FNA) LINEAR;  Surgeon: Brenna Adine LITTIE, DO;  [facility];  Service: Pulmonary;;   HERNIA REPAIR  2008   Right   IR IMAGING GUIDED PORT INSERTION  08/11/2022   IR IMAGING GUIDED PORT INSERTION  10/08/2022   IR REMOVAL TUN ACCESS W/ PORT W/O FL MOD SED  09/10/2022   LEFT HEART CATH AND CORONARY ANGIOGRAPHY N/A 09/07/2022   Procedure: LEFT HEART CATH AND CORONARY ANGIOGRAPHY;  Surgeon: Wendel Lurena POUR, MD;  [facility];  Service: Cardiovascular;  Laterality: N/A;   MOHS SURGERY Left spring 2014   basal cell face   PATCH ANGIOPLASTY Right 12/01/2019   Procedure: PATCH ANGIOPLASTY USING GEORGE BIOLOGIC PATCH;  Surgeon: Gretta Lonni PARAS, MD;  Location: [facility] OR;  Service: Vascular;  Laterality: Right;   Testicular Biopsy  2003   benign,  varicocele   US  ECHOCARDIOGRAPHY  11/2005   aortic sclerosis, EF 55-60%, diastolic dysfunction   VIDEO BRONCHOSCOPY WITH ENDOBRONCHIAL ULTRASOUND Bilateral 07/28/2022   Procedure: VIDEO BRONCHOSCOPY WITH ENDOBRONCHIAL ULTRASOUND;  Surgeon: Brenna Adine CROME, DO;  [facility];  Service: Pulmonary;  Laterality: Bilateral;    Family History  Problem Relation Age of Onset   Cancer Father 47       prostate   Coronary artery disease Father 83       MI   Prostate cancer Father    Cancer Paternal Grandfather 51       prostate   Prostate cancer Paternal Grandfather    Stroke Mother     Hypertension Mother    Cancer Mother 28       colon   Colon cancer Mother    Coronary artery disease Maternal Uncle    Cancer Sister 42       breast   Breast cancer Sister    Colon cancer Maternal Grandmother    Diabetes Neg Hx    Colon polyps Neg Hx    Esophageal cancer Neg Hx    Rectal cancer Neg Hx    Stomach cancer Neg Hx     SOCIAL HISTORY: Social History   Tobacco Use   Smoking status: Never   Smokeless tobacco: Never  Substance Use Topics   Alcohol  use: Not Currently    Comment: rare    No Known Allergies  Current Outpatient Medications  Medication Sig Dispense Refill   Accu-Chek Softclix Lancets lancets SMARTSIG:Topical     acetaminophen  (TYLENOL ) 500 MG tablet Take 1,000 mg by mouth every 6 (six) hours as needed for mild pain or headache.     aspirin  EC 81 MG tablet Take 1 tablet (81 mg total) by mouth daily. Swallow whole.     Blood Glucose Monitoring Suppl DEVI 1 each by Does not apply route in the morning, at noon, and at bedtime. E11.69. May substitute to any manufacturer covered by patient's insurance. 1 each 0   Calcium  Carb-Cholecalciferol (RA CALCIUM  PLUS VITAMIN D) 600-10 MG-MCG TABS Take 600 mg by mouth daily.     clopidogrel  (PLAVIX ) 75 MG tablet Take 1 tablet (75 mg total) by mouth daily. 90 tablet 1   fluticasone  (FLONASE ) 50 MCG/ACT nasal spray Place 1 spray into both nostrils daily as needed for allergies or rhinitis.     glipiZIDE  (GLUCOTROL ) 5 MG tablet TAKE 1 TABLET (5 MG) BY MOUTH DAILY WITH BREAKFAST. AS NEEDED FOR HIGH SUGARS WITH PREDNISONE  USE 90 tablet 1   Lancets Misc. (ACCU-CHEK SOFTCLIX LANCET DEV) KIT USE TO CHECK BLOOD SUGAR IN THE MORNING, AT NOON, AND AT BEDTIME     Multiple Vitamin (MULTIVITAMIN) tablet Take 1 tablet by mouth daily with breakfast.     NUBEQA 300 MG tablet Take 600 mg by mouth 2 (two) times daily.     potassium chloride  (KLOR-CON ) 10 MEQ tablet Take 1 tablet (10 mEq total) by mouth as needed. (Patient taking  differently: Take 10 mEq by mouth daily.) 90 tablet 3   rosuvastatin  (CRESTOR ) 5 MG tablet Take 1 tablet (5 mg total) by mouth daily. 90 tablet 1   tamsulosin  (FLOMAX ) 0.4 MG CAPS capsule Take 0.4 mg by mouth 2 (two) times daily.     No current facility-administered medications for this visit.    REVIEW OF SYSTEMS:  [X]  denotes positive finding, [ ]  denotes negative finding Cardiac  Comments:  Chest pain or chest pressure:  Shortness of breath upon exertion:    Short of breath when lying flat:    Irregular heart rhythm:        Vascular    Pain in calf, thigh, or hip brought on by ambulation:    Pain in feet at night that wakes you up from your sleep:     Blood clot in your veins:    Leg swelling:         Pulmonary    Oxygen at home:    Productive cough:     Wheezing:         Neurologic    Sudden weakness in arms or legs:     Sudden numbness in arms or legs:     Sudden onset of difficulty speaking or slurred speech:    Temporary loss of vision in one eye:     Problems with dizziness:         Gastrointestinal    Blood in stool:     Vomited blood:         Genitourinary    Burning when urinating:     Blood in urine:        Psychiatric    Major depression:         Hematologic    Bleeding problems:    Problems with blood clotting too easily:        Skin    Rashes or ulcers:        Constitutional    Fever or chills:      PHYSICAL EXAM: Vitals:   01/18/24 1421 01/18/24 1423  BP: 113/71 106/70  Pulse: 62   Resp: 18   Temp: 98 F (36.7 C)   TempSrc: Temporal   SpO2: 99%   Weight: 190 lb 9.6 oz (86.5 kg)   Height: 5' 8 (1.727 m)     GENERAL: The patient is a well-nourished male, in no acute distress. The vital signs are documented above. CARDIAC: There is a regular rate and rhythm.  VASCULAR:  Right neck incision well-healed MUSCULOSKELETAL: There are no major deformities or cyanosis. NEUROLOGIC: No focal weakness or paresthesias are detected.   Cranial nerves II through XII grossly intact.   DATA:   Carotid duplex today shows patent right carotid after previous endarterectomy and minimal 1 to 39% stenosis in the left ICA - no change since last study   Assessment/Plan:  78 year old male status post right carotid endarterectomy on 12/01/2019 for an asymptomatic high-grade stenosis presents for 1 year follow-up for surveillance of his carotid artery disease.  Discussed with him today that his right carotid endarterectomy site remains patent with no evidence of recurrent stenosis.  In addition he has minimal disease in the left ICA 1-39% with no progression over the past year.  He needs to remain on aspirin  statin for risk reduction.  Would continue surveillance with another carotid ultrasound in 1 year in our office.    Lonni DOROTHA Gaskins, MD [facility] Office: [phone]

## 2024-01-20 ENCOUNTER — Inpatient Hospital Stay: Admitting: Internal Medicine

## 2024-01-20 VITALS — BP 131/73 | HR 67 | Temp 97.9°F | Resp 18 | Ht 68.0 in | Wt 191.5 lb

## 2024-01-20 DIAGNOSIS — C7B8 Other secondary neuroendocrine tumors: Secondary | ICD-10-CM | POA: Diagnosis not present

## 2024-01-20 DIAGNOSIS — G5603 Carpal tunnel syndrome, bilateral upper limbs: Secondary | ICD-10-CM | POA: Diagnosis not present

## 2024-01-20 DIAGNOSIS — C7A1 Malignant poorly differentiated neuroendocrine tumors: Secondary | ICD-10-CM | POA: Diagnosis not present

## 2024-01-20 DIAGNOSIS — C3490 Malignant neoplasm of unspecified part of unspecified bronchus or lung: Secondary | ICD-10-CM

## 2024-01-20 DIAGNOSIS — Z79899 Other long term (current) drug therapy: Secondary | ICD-10-CM | POA: Diagnosis not present

## 2024-01-20 NOTE — Progress Notes (Signed)
 [facility] [phone]   [phone]  OFFICE PROGRESS NOTE  Larry Baller, MD [address] [place] [address]  DIAGNOSIS:  1) Metastatic poorly differentiated carcinoma, neuroendocrine carcinoma of suspicious prostate primary versus primary lung cancer presented with innumerable and bilateral pulmonary nodules in addition to right hilar and mediastinal lymphadenopathy and metastatic disease to several areas of the bone in addition to hypermetabolic activity in the prostate gland diagnosed in January 2024  2) prostate adenocarcinoma diagnosed in February 2024 currently managed by Dr. Nieves   PRIOR THERAPY: Palliative systemic chemotherapy with carboplatin  for AUC of 5 and Imfinzi  1500 Mg IV on day 1 as well as etoposide  100 Mg/M2 on days 1, 2 and 3 with Neulasta  support on day 5. Status post 7 cycles. First dose on 08/11/22.  Starting from cycle #2 his carboplatin  will be reduced to AUC of 4 and 2 etoposide  80 Mg/M2 secondary to intolerance and cycle #1.  Starting from cycle #5 he will be on maintenance treatment with single agent Imfinzi  1500 Mg IV every 4 weeks.   CURRENT THERAPY: Observation  INTERVAL HISTORY: Larry Kent 78 y.o. male returns to the clinic today for follow-up visit. Discussed the use of AI scribe software for clinical note transcription with the patient, who gave verbal consent to proceed.  History of Present Illness Larry Kent is a 78 year old male with metastatic polydifferentiated carcinoma who presents for evaluation and repeat CT scan for restaging of his disease.  He was diagnosed with metastatic polydifferentiated carcinoma, including neuroendocrine carcinoma, in January 2024. He underwent palliative systemic chemotherapy with carboplatin  and etoposide , followed by four cycles of Invanz and maintenance treatment with Invanz for three additional cycles. He has been under observation for the past  year.  Two weeks ago, he had a colonoscopy during which two polyps were removed. The polyps were found to be non-cancerous. Due to his age, another colonoscopy is not scheduled at this time.  He experiences fatigue and shortness of breath, which limit his activity to some extent. He tries to stay active by using a recumbent bike and performing a small weight regimen almost every day. 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  He has mild carpal tunnel syndrome, which he plans to address. He also notes morning throat congestion, which clears up after his morning routine.       MEDICAL HISTORY: Past Medical History:  Diagnosis Date   Allergy    Arthritis    both knees    BCC (basal cell carcinoma), arm, right 09/2019   MOHS ([place])   BCC (basal cell carcinoma), face 2014   L preauricular s/p MOHS   BCC (basal cell carcinoma), face 02/2018   L upper lip   BPH (benign prostatic hypertrophy)    Dr. Nieves @ [facility]   Carotid stenosis 05/2015   RICA 40-59%, LICA 1-39%, L vertebral occlusion, rpt 1 yr   Cataract    removed both eyes    Diabetes mellitus without complication (HCC)    FHx: colon cancer    FHx: prostate cancer    Heart murmur    mild aortic stenosis    History of kidney stones    Hyperlipidemia    borderline- on rosuvastatin  now normal    Myocardial infarction (HCC) 09/07/2022   Occlusion of right vertebral artery 05/2015    ALLERGIES:  has no known allergies.  MEDICATIONS:  Current Outpatient Medications  Medication Sig Dispense Refill   Accu-Chek Softclix Lancets lancets SMARTSIG:Topical     acetaminophen  (TYLENOL ) 500 MG tablet Take 1,000 mg by mouth every 6 (six) hours as needed for mild pain or headache.     aspirin  EC 81 MG tablet Take 1 tablet (81 mg total) by mouth daily. Swallow whole.     Blood Glucose Monitoring Suppl DEVI 1 each by Does not apply route in the morning, at noon, and at  bedtime. E11.69. May substitute to any manufacturer covered by patient's insurance. 1 each 0   Calcium  Carb-Cholecalciferol (RA CALCIUM  PLUS VITAMIN D) 600-10 MG-MCG TABS Take 600 mg by mouth daily.     clopidogrel  (PLAVIX ) 75 MG tablet Take 1 tablet (75 mg total) by mouth daily. 90 tablet 1   fluticasone  (FLONASE ) 50 MCG/ACT nasal spray Place 1 spray into both nostrils daily as needed for allergies or rhinitis.     glipiZIDE  (GLUCOTROL ) 5 MG tablet TAKE 1 TABLET (5 MG) BY MOUTH DAILY WITH BREAKFAST. AS NEEDED FOR HIGH SUGARS WITH PREDNISONE  USE 90 tablet 1   Lancets Misc. (ACCU-CHEK SOFTCLIX LANCET DEV) KIT USE TO CHECK BLOOD SUGAR IN THE MORNING, AT NOON, AND AT BEDTIME     Multiple Vitamin (MULTIVITAMIN) tablet Take 1 tablet by mouth daily with breakfast.     NUBEQA 300 MG tablet Take 600 mg by mouth 2 (two) times daily.     potassium chloride  (KLOR-CON ) 10 MEQ tablet Take 1 tablet (10 mEq total) by mouth as needed. (Patient taking differently: Take 10 mEq by mouth daily.) 90 tablet 3   rosuvastatin  (CRESTOR ) 5 MG tablet Take 1 tablet (5 mg total) by mouth daily. 90 tablet 1   tamsulosin  (FLOMAX ) 0.4 MG CAPS capsule Take 0.4 mg by mouth 2 (two) times daily.     No current facility-administered medications for this visit.    SURGICAL HISTORY:  Past Surgical History:  Procedure Laterality Date   BASAL CELL CARCINOMA EXCISION  05/2018   lip, 2020 x3  basal cells removed    CATARACT EXTRACTION  2008   Left   CATARACT EXTRACTION  2013   Right ([place])   COLONOSCOPY  ~2010   medium int hemorrhoids, o/w WNL, rpt 5 yrs given fmhx (Dr. Celestia)   COLONOSCOPY  08/2019   TAs, diverticulosis, rpt 3 yrs ([place])   COLONOSCOPY  12/2023   2TAs, diverticulosis, int hem, no f/u needed ([place])   CORONARY/GRAFT ACUTE MI REVASCULARIZATION N/A 09/07/2022   Procedure: Coronary/Graft Acute MI Revascularization;  Surgeon: Wendel Lurena POUR, MD;  Location: [facility];  Service:  Cardiovascular;  Laterality: N/A;   ENDARTERECTOMY Right 12/01/2019   Procedure: RIGHT ENDARTERECTOMY CAROTID;  Surgeon: Gretta Lonni PARAS, MD;  Location: [facility] OR;  Service: Vascular;  Laterality: Right;   exercise treadmill  11/2005   WNL ([place])   FINE NEEDLE ASPIRATION  07/28/2022   Procedure: FINE NEEDLE ASPIRATION (FNA) LINEAR;  Surgeon: Brenna Adine CROME, DO;  Location: [facility];  Service: Pulmonary;;   HERNIA REPAIR  2008   Right   IR IMAGING GUIDED PORT INSERTION  08/11/2022   IR IMAGING GUIDED PORT INSERTION  10/08/2022   IR REMOVAL TUN ACCESS W/ PORT W/O FL MOD SED  09/10/2022   LEFT HEART CATH AND CORONARY ANGIOGRAPHY N/A 09/07/2022   Procedure: LEFT HEART CATH AND CORONARY ANGIOGRAPHY;  Surgeon: Wendel Lurena POUR, MD;  Location: [facility];  Service: Cardiovascular;  Laterality: N/A;  MOHS SURGERY Left spring 2014   basal cell face   PATCH ANGIOPLASTY Right 12/01/2019   Procedure: PATCH ANGIOPLASTY USING GEORGE BIOLOGIC PATCH;  Surgeon: Gretta Lonni PARAS, MD;  Location: [facility] OR;  Service: Vascular;  Laterality: Right;   Testicular Biopsy  2003   benign, varicocele   US  ECHOCARDIOGRAPHY  11/2005   aortic sclerosis, EF 55-60%, diastolic dysfunction   VIDEO BRONCHOSCOPY WITH ENDOBRONCHIAL ULTRASOUND Bilateral 07/28/2022   Procedure: VIDEO BRONCHOSCOPY WITH ENDOBRONCHIAL ULTRASOUND;  Surgeon: Brenna Adine CROME, DO;  Location: [facility];  Service: Pulmonary;  Laterality: Bilateral;    REVIEW OF SYSTEMS:  Constitutional: positive for fatigue Eyes: negative Ears, nose, mouth, throat, and face: negative Respiratory: negative Cardiovascular: negative Gastrointestinal: negative Genitourinary:negative Integument/breast: negative Hematologic/lymphatic: negative Musculoskeletal:negative Neurological: negative Behavioral/Psych: negative Endocrine: negative Allergic/Immunologic: negative   PHYSICAL EXAMINATION: General appearance: alert, cooperative,  fatigued, and no distress Head: Normocephalic, without obvious abnormality, atraumatic Neck: no adenopathy, no JVD, supple, symmetrical, trachea midline, and thyroid  not enlarged, symmetric, no tenderness/mass/nodules Lymph nodes: Cervical, supraclavicular, and axillary nodes normal. Resp: clear to auscultation bilaterally Back: symmetric, no curvature. ROM normal. No CVA tenderness. Cardio: regular rate and rhythm, S1, S2 normal, no murmur, click, rub or gallop GI: soft, non-tender; bowel sounds normal; no masses,  no organomegaly Extremities: extremities normal, atraumatic, no cyanosis or edema Neurologic: Alert and oriented X 3, normal strength and tone. Normal symmetric reflexes. Normal coordination and gait  ECOG PERFORMANCE STATUS: 1 - Symptomatic but completely ambulatory  Blood pressure 131/73, pulse 67, temperature 97.9 F (36.6 C), temperature source Temporal, resp. rate 18, height 5' 8 (1.727 m), weight 191 lb 8 oz (86.9 kg), SpO2 97%.  LABORATORY DATA: Lab Results  Component Value Date   WBC 4.1 01/14/2024   HGB 12.0 (L) 01/14/2024   HCT 34.4 (L) 01/14/2024   MCV 97.7 01/14/2024   PLT 132 (L) 01/14/2024      Chemistry      Component Value Date/Time   NA 140 01/14/2024 1200   NA 140 05/31/2023 1417   K 3.9 01/14/2024 1200   CL 108 01/14/2024 1200   CO2 27 01/14/2024 1200   BUN 17 01/14/2024 1200   BUN 14 05/31/2023 1417   CREATININE 0.95 01/14/2024 1200      Component Value Date/Time   CALCIUM  9.1 01/14/2024 1200   ALKPHOS 212 (H) 01/14/2024 1200   AST 30 01/14/2024 1200   ALT 21 01/14/2024 1200   BILITOT 0.8 01/14/2024 1200       RADIOGRAPHIC STUDIES: CT CHEST ABDOMEN PELVIS W CONTRAST Result Date: 01/19/2024 CLINICAL DATA:  Non-small cell lung cancer status post chemotherapy, restaging , history of prostate cancer on testosterone  blocker EXAM: CT CHEST, ABDOMEN, AND PELVIS WITH CONTRAST TECHNIQUE: Multidetector CT imaging of the chest, abdomen and  pelvis was performed following the standard protocol during bolus administration of intravenous contrast. RADIATION DOSE REDUCTION: This exam was performed according to the departmental dose-optimization program which includes automated exposure control, adjustment of the mA and/or kV according to patient size and/or use of iterative reconstruction technique. CONTRAST:  OMNIPAQUE  IOHEXOL  300 MG/ML  SOLN COMPARISON:  Chest CT abdomen pelvis October 19, 2023 FINDINGS: CT CHEST FINDINGS Cardiovascular: The heart size is normal. Atherosclerotic calcifications of coronary arteries. No pericardial fluid. Mediastinum/Nodes: Subcentimeter subcarinal lymph nodes are stable to prior. No new or enlarging mediastinal, hilar, or axillary lymph nodes. Thyroid  gland, trachea, and esophagus demonstrate no significant findings. Lungs/Pleura: Stable left small left pleural effusion. No effusion on the  right. Stable spiculated pulmonary nodule in right middle lobe measuring 1.6 x 1.6 cm (4/95). Multiple satellite nodules are identified on around this lesion. There are multiple additional bilateral solid and part solid nodules,for example several scattered throughout the right upper lob consistent with metastasis , measuring up to 7 mm grossly stable to prior. No new nodules identified. CT ABDOMEN PELVIS FINDINGS Hepatobiliary: Nodular liver contour without focal enhancing liver lesion. Normal gallbladder. Pancreas: Fatty infiltration of the pancreas.  No pancreatic mass. Spleen: Normal in size without focal abnormality. Adrenals/Urinary Tract: Adrenal glands are unremarkable. Kidneys are normal, without renal calculi, focal lesion, or hydronephrosis. There is increased wall thickness of the left bladder wall, similar to prior. Stomach/Bowel: Stomach is within normal limits. Sigmoid diverticulosis without diverticulitis. No evidence of bowel wall thickening, distention, or inflammatory changes. Vascular/Lymphatic: Aortic  atherosclerosis. No enlarged abdominal or pelvic lymph nodes.Incidental note was made of retroaortic left renal vein. Reproductive: Normal size prostate with increased density a along the peripheral left lobe likely posttreatment calcification as opposed to postcontrast enhancement (evaluation is suboptimal given lack of noncontrast images). Other: Bilateral inguinal hernias. Tiny fat containing umbilical hernia. Musculoskeletal: Sclerotic metastatic lesions involving multiple vertebral bodies and throughout the sacrum and pelvic bones, stable to prior. IMPRESSION: Stable appearance of multiple pulmonary metastatic nodules and left small pleural effusion. Follow-up according to oncology protocols. Asymmetric increased left bladder wall thickness, stable to prior possibly postradiation changes. Increased density along the left prostate peripheral lobe, similar to prior likely posttreatment calcifications. Sclerotic osseous metastasis throughout the axial skeleton, stable to prior. No new lymphadenopathy. Electronically Signed   By: Megan  Zare M.D.   On: 01/19/2024 18:59   VAS US  CAROTID Result Date: 01/18/2024 Carotid Arterial Duplex Study Patient Name:  DESTRY DAUBER  Date of Exam:   01/18/2024 Medical Rec #: [id number]        Accession #:    [id number] Date of Birth: 1945-10-05        Patient Gender: M Patient Age:   41 years Exam Location:  [address] Procedure:      VAS US  CAROTID Referring Phys: LONNI GASKINS --------------------------------------------------------------------------------  Indications:       Right endarterectomy. Risk Factors:      Hyperlipidemia, Diabetes, no history of smoking, coronary                    artery disease. Comparison Study:  10/20/2022 Carotid artery duplex- Right ICA- there is no                    evidence of stenosis. Left ICA- 1-39% stenosis. Performing Technologist: Rosaline Fujisawa MHA, RDMS, RVT, RDCS  Examination Guidelines: A complete evaluation includes  B-mode imaging, spectral Doppler, color Doppler, and power Doppler as needed of all accessible portions of each vessel. Bilateral testing is considered an integral part of a complete examination. Limited examinations for reoccurring indications may be performed as noted.  Right Carotid Findings: +----------+--------+--------+--------+----------------------+--------+           PSV cm/sEDV cm/sStenosisPlaque Description    Comments +----------+--------+--------+--------+----------------------+--------+ CCA Prox  57      11                                             +----------+--------+--------+--------+----------------------+--------+ CCA Distal67      16  smooth and homogeneous         +----------+--------+--------+--------+----------------------+--------+ ICA Prox  64      8               smooth and homogeneous         +----------+--------+--------+--------+----------------------+--------+ ICA Mid   50      17                                             +----------+--------+--------+--------+----------------------+--------+ ICA Distal94      30                                             +----------+--------+--------+--------+----------------------+--------+ ECA       87      13                                             +----------+--------+--------+--------+----------------------+--------+ +----------+--------+-------+----------------+-------------------+           PSV cm/sEDV cmsDescribe        Arm Pressure (mmHG) +----------+--------+-------+----------------+-------------------+ [id number]             Multiphasic, WNL                    +----------+--------+-------+----------------+-------------------+ +---------+--------+--+--------+--+---------+ VertebralPSV cm/s58EDV cm/s19Antegrade +---------+--------+--+--------+--+---------+  Left Carotid Findings: +----------+--------+--------+--------+-----------------------+--------+            PSV cm/sEDV cm/sStenosisPlaque Description     Comments +----------+--------+--------+--------+-----------------------+--------+ CCA Prox  67      15                                              +----------+--------+--------+--------+-----------------------+--------+ CCA Distal83      22                                              +----------+--------+--------+--------+-----------------------+--------+ ICA Prox  47      17              smooth and hyperechoic          +----------+--------+--------+--------+-----------------------+--------+ ICA Mid   76      29                                              +----------+--------+--------+--------+-----------------------+--------+ ICA Distal95      35                                              +----------+--------+--------+--------+-----------------------+--------+ ECA       79      11              smooth and heterogenous         +----------+--------+--------+--------+-----------------------+--------+ +----------+--------+--------+----------------+-------------------+  PSV cm/sEDV cm/sDescribe        Arm Pressure (mmHG) +----------+--------+--------+----------------+-------------------+ Subclavian100             Multiphasic, WNL                    +----------+--------+--------+----------------+-------------------+ +---------+--------+--+--------+-+---------+ VertebralPSV cm/s19EDV cm/s5Antegrade +---------+--------+--+--------+-+---------+   Summary: Right Carotid: Patent right ICA endarterectomy site without evidence of                restenosis. Left Carotid: Velocities in the left ICA are consistent with a 1-39% stenosis. Vertebrals:  Bilateral vertebral arteries demonstrate antegrade flow. Subclavians: Normal flow hemodynamics were seen in bilateral subclavian              arteries. *See table(s) above for measurements and observations.  Electronically signed by Lonni Gaskins MD  on 01/18/2024 at 2:56:26 PM.    Final     ASSESSMENT AND PLAN: This is a very pleasant 78 years old white male with: 1) Metastatic poorly differentiated carcinoma, neuroendocrine carcinoma of suspicious prostate primary versus primary lung cancer presented with innumerable and bilateral pulmonary nodules in addition to right hilar and mediastinal lymphadenopathy and metastatic disease to several areas of the bone in addition to hypermetabolic activity in the prostate gland diagnosed in January 2024  The patient is currently undergoing systemic chemotherapy with carboplatin  for AUC of 5 on day 1, Imfinzi  1500 Mg IV on day 1, etoposide  100 Mg/M2 on days 1, 2 and 3 with Neulasta  support every 3 weeks status post 7 cycles.  Starting from cycle #5 the patient will be on maintenance treatment with Imfinzi  1500 Mg IV every 4 weeks.  His treatment was discontinued secondary to liver dysfunction. The patient is currently on observation and he is feeling fine with no concerning complaints.  He had repeat CT scan of the chest, abdomen and pelvis performed recently.  I personally independently reviewed the scan and discussed the result with the patient today.  His scan showed no concerning findings for disease progression. Assessment and Plan Assessment & Plan Metastatic polydifferentiated carcinoma, including neuroendocrine carcinoma Metastatic polydifferentiated carcinoma, including neuroendocrine carcinoma, diagnosed in January 2024. Status post palliative systemic chemotherapy with carboplatin , etoposide , and Invanz for four cycles, followed by maintenance treatment with Invanz for three more cycles. Recent CT scan of the chest, abdomen, and pelvis shows well-managed disease with no new growths. Lung nodules remain stable. No new respiratory symptoms reported, and he maintains an active lifestyle despite some fatigue and dyspnea. - Schedule follow-up CT scan in three months.  Carpal tunnel syndrome Reports  symptoms of carpal tunnel syndrome. The patient was advised to call immediately if he has any concerning symptoms in the interval. The patient voices understanding of current disease status and treatment options and is in agreement with the current care plan.  All questions were answered. The patient knows to call the clinic with any problems, questions or concerns. We can certainly see the patient much sooner if necessary.  The total time spent in the appointment was 30 minutes.  Disclaimer: This note was dictated with voice recognition software. Similar sounding words can inadvertently be transcribed and may not be corrected upon review.

## 2024-01-21 DIAGNOSIS — G5603 Carpal tunnel syndrome, bilateral upper limbs: Secondary | ICD-10-CM | POA: Diagnosis not present

## 2024-01-24 ENCOUNTER — Telehealth: Payer: Self-pay | Admitting: Internal Medicine

## 2024-01-24 NOTE — Telephone Encounter (Signed)
Scheduled appointments with the patient

## 2024-01-25 ENCOUNTER — Encounter: Payer: Self-pay | Admitting: Family Medicine

## 2024-01-25 ENCOUNTER — Ambulatory Visit (INDEPENDENT_AMBULATORY_CARE_PROVIDER_SITE_OTHER): Payer: Medicare Other | Admitting: Family Medicine

## 2024-01-25 VITALS — BP 110/72 | HR 78 | Temp 97.8°F | Ht 68.0 in | Wt 189.1 lb

## 2024-01-25 DIAGNOSIS — R5383 Other fatigue: Secondary | ICD-10-CM

## 2024-01-25 DIAGNOSIS — E559 Vitamin D deficiency, unspecified: Secondary | ICD-10-CM | POA: Diagnosis not present

## 2024-01-25 DIAGNOSIS — Z7189 Other specified counseling: Secondary | ICD-10-CM

## 2024-01-25 DIAGNOSIS — K7460 Unspecified cirrhosis of liver: Secondary | ICD-10-CM

## 2024-01-25 DIAGNOSIS — E1169 Type 2 diabetes mellitus with other specified complication: Secondary | ICD-10-CM | POA: Diagnosis not present

## 2024-01-25 DIAGNOSIS — C61 Malignant neoplasm of prostate: Secondary | ICD-10-CM

## 2024-01-25 DIAGNOSIS — C7A1 Malignant poorly differentiated neuroendocrine tumors: Secondary | ICD-10-CM

## 2024-01-25 DIAGNOSIS — N3941 Urge incontinence: Secondary | ICD-10-CM

## 2024-01-25 DIAGNOSIS — Z0000 Encounter for general adult medical examination without abnormal findings: Secondary | ICD-10-CM

## 2024-01-25 DIAGNOSIS — E785 Hyperlipidemia, unspecified: Secondary | ICD-10-CM

## 2024-01-25 DIAGNOSIS — I25110 Atherosclerotic heart disease of native coronary artery with unstable angina pectoris: Secondary | ICD-10-CM | POA: Diagnosis not present

## 2024-01-25 DIAGNOSIS — I6523 Occlusion and stenosis of bilateral carotid arteries: Secondary | ICD-10-CM

## 2024-01-25 LAB — LIPID PANEL
Cholesterol: 149 mg/dL (ref 0–200)
HDL: 71.1 mg/dL (ref >39.00)
LDL Cholesterol: 62 mg/dL (ref 0–99)
NonHDL: 77.89
Total CHOL/HDL Ratio: 2
Triglycerides: 80 mg/dL (ref 0.0–149.0)
VLDL: 16 mg/dL (ref 0.0–40.0)

## 2024-01-25 LAB — HEMOGLOBIN A1C: Hgb A1c MFr Bld: 6.3 % (ref 4.6–6.5)

## 2024-01-25 LAB — VITAMIN B12: Vitamin B-12: 817 pg/mL (ref 211–911)

## 2024-01-25 LAB — VITAMIN D 25 HYDROXY (VIT D DEFICIENCY, FRACTURES): VITD: 27.17 ng/mL — ABNORMAL LOW (ref 30.00–100.00)

## 2024-01-25 NOTE — Patient Instructions (Addendum)
 Labs today  Check with pharmacy about shingles shots and RSV shot.  Good to see you today Return as needed or in 6 months for follow up visit

## 2024-01-25 NOTE — Assessment & Plan Note (Signed)
 Diet controlled, previously prednisone  induced.  Now off prednisone . Update A1c.

## 2024-01-25 NOTE — Assessment & Plan Note (Addendum)
 Chronic, on low dose Crestor . Update FLP. The ASCVD Risk score (Arnett DK, et al., 2019) failed to calculate for the following reasons:   Risk score cannot be calculated because patient has a medical history suggesting prior/existing ASCVD

## 2024-01-25 NOTE — Assessment & Plan Note (Signed)
 Previously discussed.

## 2024-01-25 NOTE — Assessment & Plan Note (Signed)
 Preventative protocols reviewed and updated unless pt declined. Discussed healthy diet and lifestyle.

## 2024-01-25 NOTE — Progress Notes (Unsigned)
 Ph: [phone] Fax: [phone]   Patient ID: Larry Kent, male    DOB: 07-25-1945, 78 y.o.   MRN: [id number]  This visit was conducted in person.  BP 110/72   Pulse 78   Temp 97.8 F (36.6 C) (Oral)   Ht 5' 8 (1.727 m)   Wt 189 lb 2 oz (85.8 kg)   SpO2 99%   BMI 28.76 kg/m    CC: CPE Subjective:   HPI: Larry Kent is a 78 y.o. male presenting on 01/25/2024 for Annual Exam   Saw health advisor 09/2023 for medicare wellness visit. Note reviewed.   No results found.  Flowsheet Row Office Visit from 01/25/2024 in [facility] [place]  PHQ-2 Total Score 0       10/07/2023   10:17 AM 08/16/2023    3:30 PM 06/29/2023   10:58 AM 11/25/2022   10:07 AM 10/06/2022    9:46 AM  Fall Risk   Falls in the past year? 0 0 0 0 0  Number falls in past yr: 0   0 0  Injury with Fall? 0   0 0  Risk for fall due to : No Fall Risks   Medication side effect No Fall Risks  Follow up Education provided;Falls prevention discussed   Falls evaluation completed;Education provided;Falls prevention discussed Falls prevention discussed;Education provided;Falls evaluation completed    Diagnosed 06/2022 with metastatic poorly differentiated neuroendocrine carcinoma, primary prostate vs lung with involvement of lungs, hilar and mediastinal LN, bone. Also with prostate adenocarcinoma diagnosed 07/2022 managed by urology [place]) initially on abiraterone (Zytiga) 1000mg  daily + prednisone  5mg  BID. Has received Eligard - now on [place]. Was on  chemo 07/2022-10/2022, then immunotherapy through 12/2022. Regularly sees urology, oncology. Planning repeat cystoscopy 02/2024.    CAD - inferior STEMI 08/2022 s/p hospitalization with catheterization and RCA stent placement DES x4. He completed cardiac rehab. Hospitalization complicated by Enterococcus faecalis urosepsis treated with IV ampicillin  then oral amoxicillin . Flomax  started for urinary retention. Continues brillinta, aspirin   stopped due to anemia 09/2022. S/p cardiac rehab.   Saw ID in follow up 10/2022 - blcx x2 normal.   Known degenerative mitral valve changes without stenosis on latest echo 08/2022, also with known mild AS and moderate AR.  Carotid stenosis s/p R CEA 11/2019 monitoring yearly (Dr [facility]).   Prednisone  induced diabetes managed with glipizide  5mg  with breakfast while on prednisone . Off prednisone , off glipizide .   New dx cirrhosis with elevated LFTs - saw [facility]. High fib4 score suspicious for advanced fibrosis but low MELD score. Liver workup overall reassuring (09/2023). Planning to see Stephane Quest at [facility] next month. Found to be Hep B non-immune.   Has seen ortho for bilat knee osteoarthritis and L>R CTS s/p recent NCS. S/p knee viscosupplementation injection bilaterally 09/2023.  Preventative: Colonoscopy 08/2019 - 5 TAs, diverticulosis, rpt 3 yrs ([facility]).  COLONOSCOPY 12/2023 - 2 TAs, diverticulosis, int hem, no f/u needed ([facility]) Known prostate cancer, see above - sees Dr Nieves yearly at [facility]. H/o BPH on tamsulosin   Lung cancer screening - not eligible  Flu shot yearly  COVID vaccine Moderna 07/2019, 08/2019, booster 04/2020 Td - 2013  Pneumovax - 2013, prevnar-13 2015  RSV - discussed, to consider  Shingrix - discussed, to consider  Advanced directives: packet provided today. Would want wife to be HCPOA. Has at home - needs notarized and will bring us  copy. Ok with CPR and temporary life support, would  want family to to help decide if prolonged illness. Would be ok with feeding tube.  Seat belt use discussed  Sunscreen use discussed. No changing moles on skin. Due to see derm.  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 Interim medical history since our last visit reviewed. Allergies and medications reviewed and updated. Outpatient Medications Prior to Visit  Medication Sig Dispense Refill   Accu-Chek Softclix Lancets lancets SMARTSIG:Topical     acetaminophen  (TYLENOL ) 500 MG tablet Take 1,000 mg by mouth every 6 (six) hours as needed for mild pain or headache.     aspirin  EC 81 MG tablet Take 1 tablet (81 mg total) by mouth daily. Swallow whole.     Blood Glucose Monitoring Suppl DEVI 1 each by Does not apply route in the morning, at noon, and at bedtime. E11.69. May substitute to any manufacturer covered by patient's insurance. 1 each 0   Calcium  Carb-Cholecalciferol (RA CALCIUM  PLUS VITAMIN D ) 600-10 MG-MCG TABS Take 600 mg by mouth daily.     clopidogrel  (PLAVIX ) 75 MG tablet Take 1 tablet (75 mg total) by mouth daily. 90 tablet 1   fluticasone  (FLONASE ) 50 MCG/ACT nasal spray Place 1 spray into both nostrils daily as needed for allergies or rhinitis.     Lancets Misc. (ACCU-CHEK SOFTCLIX LANCET DEV) KIT USE TO CHECK BLOOD SUGAR IN THE MORNING, AT NOON, AND AT BEDTIME     Multiple Vitamin (MULTIVITAMIN) tablet Take 1 tablet by mouth daily with breakfast.     NUBEQA 300 MG tablet Take 600 mg by mouth 2 (two) times daily.     potassium chloride  (KLOR-CON ) 10 MEQ tablet Take 1 tablet (10 mEq total) by mouth as needed. (Patient taking differently: Take 10 mEq by mouth daily.) 90 tablet 3   rosuvastatin  (CRESTOR ) 5 MG tablet Take 1 tablet (5 mg total) by mouth daily. 90 tablet 1   tamsulosin  (FLOMAX ) 0.4 MG CAPS capsule Take 0.4 mg by mouth 2 (two) times daily.     glipiZIDE   (GLUCOTROL ) 5 MG tablet TAKE 1 TABLET (5 MG) BY MOUTH DAILY WITH BREAKFAST. AS NEEDED FOR HIGH SUGARS WITH PREDNISONE  USE (Patient not taking: Reported on 01/25/2024) 90 tablet 1   No facility-administered medications prior to visit.     Per HPI unless specifically indicated in ROS section below Review of Systems  Constitutional:  Negative for activity change, appetite change, chills, fatigue, fever and unexpected weight change.  HENT:  Negative for hearing loss.   Eyes:  Negative for visual disturbance.  Respiratory:  Negative for cough, chest tightness, shortness of breath and wheezing.   Cardiovascular:  Negative for chest pain, palpitations and leg swelling.  Gastrointestinal:  Negative for abdominal distention, abdominal pain, blood in stool, constipation, diarrhea, nausea and vomiting.  Genitourinary:  Negative for difficulty urinating and hematuria.  Musculoskeletal:  Negative for arthralgias, myalgias and neck pain.       L>R CTS  Skin:  Negative  for rash.  Neurological:  Positive for numbness (L>R hands). Negative for dizziness, seizures, syncope and headaches.  Hematological:  Negative for adenopathy. Does not bruise/bleed easily.  Psychiatric/Behavioral:  Negative for dysphoric mood. The patient is not nervous/anxious.     Objective:  BP 110/72   Pulse 78   Temp 97.8 F (36.6 C) (Oral)   Ht 5' 8 (1.727 m)   Wt 189 lb 2 oz (85.8 kg)   SpO2 99%   BMI 28.76 kg/m   Wt Readings from Last 3 Encounters:  01/25/24 189 lb 2 oz (85.8 kg)  01/20/24 191 lb 8 oz (86.9 kg)  01/18/24 190 lb 9.6 oz (86.5 kg)      Physical Exam Vitals and nursing note reviewed.  Constitutional:      General: He is not in acute distress.    Appearance: Normal appearance. He is well-developed. He is not ill-appearing.  HENT:     Head: Normocephalic and atraumatic.     Right Ear: Hearing, tympanic membrane, ear canal and external ear normal.     Left Ear: Hearing, tympanic membrane, ear canal and  external ear normal.     Mouth/Throat:     Mouth: Mucous membranes are moist.     Pharynx: Oropharynx is clear. No oropharyngeal exudate or posterior oropharyngeal erythema.  Eyes:     General: No scleral icterus.    Extraocular Movements: Extraocular movements intact.     Conjunctiva/sclera: Conjunctivae normal.     Pupils: Pupils are equal, round, and reactive to light.  Neck:     Thyroid : No thyroid  mass or thyromegaly.     Vascular: Carotid bruit (referred) present.  Cardiovascular:     Rate and Rhythm: Normal rate and regular rhythm.     Pulses: Normal pulses.          Radial pulses are 2+ on the right side and 2+ on the left side.     Heart sounds: Murmur (3/6 systolic loudest at apex) heard.  Pulmonary:     Effort: Pulmonary effort is normal. No respiratory distress.     Breath sounds: Normal breath sounds. No wheezing, rhonchi or rales.  Abdominal:     General: Bowel sounds are normal. There is no distension.     Palpations: Abdomen is soft. There is no mass.     Tenderness: There is no abdominal tenderness. There is no guarding or rebound.     Hernia: No hernia is present.  Musculoskeletal:        General: Normal range of motion.     Cervical back: Normal range of motion and neck supple.     Right lower leg: No edema.     Left lower leg: No edema.  Lymphadenopathy:     Cervical: No cervical adenopathy.  Skin:    General: Skin is warm and dry.     Findings: No rash.  Neurological:     General: No focal deficit present.     Mental Status: He is alert and oriented to person, place, and time.  Psychiatric:        Mood and Affect: Mood normal.        Behavior: Behavior normal.        Thought Content: Thought content normal.        Judgment: Judgment normal.       Results for orders placed or performed in visit on 01/14/24  PSA, total and free   Collection Time: 01/14/24 12:00 PM  Result Value Ref Range  PSA, Free 2.49 N/A ng/mL   PSA, Free Pct 60.7 %    Prostate Specific Ag, Serum 4.1 (H) 0.0 - 4.0 ng/mL  CMP (Cancer Center only)   Collection Time: 01/14/24 12:00 PM  Result Value Ref Range   Sodium 140 135 - 145 mmol/L   Potassium 3.9 3.5 - 5.1 mmol/L   Chloride 108 98 - 111 mmol/L   CO2 27 22 - 32 mmol/L   Glucose, Bld 118 (H) 70 - 99 mg/dL   BUN 17 8 - 23 mg/dL   Creatinine 9.04 9.38 - 1.24 mg/dL   Calcium  9.1 8.9 - 10.3 mg/dL   Total Protein 6.5 6.5 - 8.1 g/dL   Albumin 3.6 3.5 - 5.0 g/dL   AST 30 15 - 41 U/L   ALT 21 0 - 44 U/L   Alkaline Phosphatase 212 (H) 38 - 126 U/L   Total Bilirubin 0.8 0.0 - 1.2 mg/dL   GFR, Estimated >39 >39 mL/min   Anion gap 5 5 - 15  CBC with Differential (Cancer Center Only)   Collection Time: 01/14/24 12:00 PM  Result Value Ref Range   WBC Count 4.1 4.0 - 10.5 K/uL   RBC 3.52 (L) 4.22 - 5.81 MIL/uL   Hemoglobin 12.0 (L) 13.0 - 17.0 g/dL   HCT 65.5 (L) 60.9 - 47.9 %   MCV 97.7 80.0 - 100.0 fL   MCH 34.1 (H) 26.0 - 34.0 pg   MCHC 34.9 30.0 - 36.0 g/dL   RDW 86.7 88.4 - 84.4 %   Platelet Count 132 (L) 150 - 400 K/uL   nRBC 0.0 0.0 - 0.2 %   Neutrophils Relative % 48 %   Neutro Abs 2.0 1.7 - 7.7 K/uL   Lymphocytes Relative 29 %   Lymphs Abs 1.2 0.7 - 4.0 K/uL   Monocytes Relative 14 %   Monocytes Absolute 0.6 0.1 - 1.0 K/uL   Eosinophils Relative 7 %   Eosinophils Absolute 0.3 0.0 - 0.5 K/uL   Basophils Relative 2 %   Basophils Absolute 0.1 0.0 - 0.1 K/uL   Immature Granulocytes 0 %   Abs Immature Granulocytes 0.00 0.00 - 0.07 K/uL   Lab Results  Component Value Date   CHOL 139 12/22/2022   HDL 62.50 12/22/2022   LDLCALC 64 12/22/2022   LDLDIRECT 144.8 02/09/2012   TRIG 63.0 12/22/2022   CHOLHDL 2 12/22/2022   Lab Results  Component Value Date   HGBA1C 6.6 (A) 09/30/2023   No results found for: MARIEN BOLLS, [facility]  Lab Results  Component Value Date   VITAMINB12 801 09/09/2020    Assessment & Plan:   Problem List Items Addressed This Visit      Hyperlipidemia with target LDL less than 70 (Chronic)   Relevant Orders   Lipid panel   Advanced care planning/counseling discussion (Chronic)   Previously discussed      Health maintenance examination - Primary (Chronic)   Preventative protocols reviewed and updated unless pt declined. Discussed healthy diet and lifestyle.       Type 2 diabetes mellitus with other specified complication (HCC)   Relevant Orders   Hemoglobin A1c   Other Visit Diagnoses       Fatigue, unspecified type       Relevant Orders   Vitamin B12   VITAMIN D  25 Hydroxy (Vit-D Deficiency, Fractures)        No orders of the defined types were placed in this encounter.   Orders Placed This Encounter  Procedures   Lipid panel   Hemoglobin A1c   Vitamin B12   VITAMIN D  25 Hydroxy (Vit-D Deficiency, Fractures)    Patient Instructions  Labs today  Check with pharmacy about shingles shots and RSV shot.  Good to see you today Return as needed or in 6 months for follow up visit   Follow up plan: Return in about 6 months (around 07/27/2024) for follow up visit.  Anton Blas, MD

## 2024-01-26 ENCOUNTER — Ambulatory Visit: Payer: Self-pay | Admitting: Family Medicine

## 2024-01-26 DIAGNOSIS — E559 Vitamin D deficiency, unspecified: Secondary | ICD-10-CM | POA: Insufficient documentation

## 2024-01-26 MED ORDER — VITAMIN D3 25 MCG (1000 UT) PO CAPS: 1.0000 | ORAL_CAPSULE | Freq: Every day | ORAL | Status: DC

## 2024-01-26 NOTE — Assessment & Plan Note (Addendum)
 Appreciate urology/oncology care.  Now on Nubeqa

## 2024-01-26 NOTE — Assessment & Plan Note (Signed)
 Continue aspirin , statin, plavix .  Regularly sees cardiology.

## 2024-01-26 NOTE — Assessment & Plan Note (Signed)
 Followed by [facility] s/p R CEA 2021

## 2024-01-26 NOTE — Assessment & Plan Note (Signed)
Wears depends. ?

## 2024-01-26 NOTE — Assessment & Plan Note (Signed)
 Saw GI, pending liver clinic eval next month (Dawn Drazek).

## 2024-01-26 NOTE — Assessment & Plan Note (Signed)
 Appreciate onc care, completed chemo followed by immunotherapy

## 2024-01-27 DIAGNOSIS — G5603 Carpal tunnel syndrome, bilateral upper limbs: Secondary | ICD-10-CM | POA: Diagnosis not present

## 2024-02-05 ENCOUNTER — Other Ambulatory Visit: Payer: Self-pay | Admitting: Cardiovascular Disease

## 2024-02-05 DIAGNOSIS — I679 Cerebrovascular disease, unspecified: Secondary | ICD-10-CM

## 2024-02-15 DIAGNOSIS — G5602 Carpal tunnel syndrome, left upper limb: Secondary | ICD-10-CM | POA: Diagnosis not present

## 2024-02-24 DIAGNOSIS — G5602 Carpal tunnel syndrome, left upper limb: Secondary | ICD-10-CM | POA: Diagnosis not present

## 2024-02-25 DIAGNOSIS — R932 Abnormal findings on diagnostic imaging of liver and biliary tract: Secondary | ICD-10-CM | POA: Diagnosis not present

## 2024-03-12 ENCOUNTER — Other Ambulatory Visit: Payer: Self-pay | Admitting: Cardiovascular Disease

## 2024-03-22 DIAGNOSIS — R3915 Urgency of urination: Secondary | ICD-10-CM | POA: Diagnosis not present

## 2024-03-22 DIAGNOSIS — C778 Secondary and unspecified malignant neoplasm of lymph nodes of multiple regions: Secondary | ICD-10-CM | POA: Diagnosis not present

## 2024-03-22 DIAGNOSIS — C7951 Secondary malignant neoplasm of bone: Secondary | ICD-10-CM | POA: Diagnosis not present

## 2024-04-11 ENCOUNTER — Ambulatory Visit (HOSPITAL_COMMUNITY)
Admission: RE | Admit: 2024-04-11 | Discharge: 2024-04-11 | Disposition: A | Discharge status: Home / Self Care | Source: Ambulatory Visit | Attending: Internal Medicine | Admitting: Internal Medicine

## 2024-04-11 ENCOUNTER — Encounter (HOSPITAL_COMMUNITY): Payer: Self-pay

## 2024-04-11 ENCOUNTER — Inpatient Hospital Stay: Attending: Internal Medicine

## 2024-04-11 DIAGNOSIS — C61 Malignant neoplasm of prostate: Secondary | ICD-10-CM | POA: Insufficient documentation

## 2024-04-11 DIAGNOSIS — C7951 Secondary malignant neoplasm of bone: Secondary | ICD-10-CM | POA: Diagnosis not present

## 2024-04-11 DIAGNOSIS — Z9221 Personal history of antineoplastic chemotherapy: Secondary | ICD-10-CM | POA: Diagnosis not present

## 2024-04-11 DIAGNOSIS — C3490 Malignant neoplasm of unspecified part of unspecified bronchus or lung: Secondary | ICD-10-CM | POA: Insufficient documentation

## 2024-04-11 DIAGNOSIS — C7800 Secondary malignant neoplasm of unspecified lung: Secondary | ICD-10-CM | POA: Diagnosis not present

## 2024-04-11 LAB — CBC WITH DIFFERENTIAL (CANCER CENTER ONLY)
Abs Immature Granulocytes: 0.02 K/uL (ref 0.00–0.07)
Basophils Absolute: 0.1 K/uL (ref 0.0–0.1)
Basophils Relative: 1 %
Eosinophils Absolute: 0.2 K/uL (ref 0.0–0.5)
Eosinophils Relative: 4 %
HCT: 34 % — ABNORMAL LOW (ref 39.0–52.0)
Hemoglobin: 11.9 g/dL — ABNORMAL LOW (ref 13.0–17.0)
Immature Granulocytes: 0 %
Lymphocytes Relative: 19 %
Lymphs Abs: 0.9 K/uL (ref 0.7–4.0)
MCH: 35.2 pg — ABNORMAL HIGH (ref 26.0–34.0)
MCHC: 35 g/dL (ref 30.0–36.0)
MCV: 100.6 fL — ABNORMAL HIGH (ref 80.0–100.0)
Monocytes Absolute: 0.5 K/uL (ref 0.1–1.0)
Monocytes Relative: 10 %
Neutro Abs: 3.3 K/uL (ref 1.7–7.7)
Neutrophils Relative %: 66 %
Platelet Count: 144 K/uL — ABNORMAL LOW (ref 150–400)
RBC: 3.38 MIL/uL — ABNORMAL LOW (ref 4.22–5.81)
RDW: 13.6 % (ref 11.5–15.5)
WBC Count: 4.9 K/uL (ref 4.0–10.5)
nRBC: 0 % (ref 0.0–0.2)

## 2024-04-11 LAB — CMP (CANCER CENTER ONLY)
ALT: 32 U/L (ref 0–44)
AST: 38 U/L (ref 15–41)
Albumin: 3.8 g/dL (ref 3.5–5.0)
Alkaline Phosphatase: 441 U/L — ABNORMAL HIGH (ref 38–126)
Anion gap: 4 — ABNORMAL LOW (ref 5–15)
BUN: 18 mg/dL (ref 8–23)
CO2: 27 mmol/L (ref 22–32)
Calcium: 9.4 mg/dL (ref 8.9–10.3)
Chloride: 106 mmol/L (ref 98–111)
Creatinine: 0.88 mg/dL (ref 0.61–1.24)
GFR, Estimated: >60 mL/min (ref >60)
Glucose, Bld: 134 mg/dL — ABNORMAL HIGH (ref 70–99)
Potassium: 4 mmol/L (ref 3.5–5.1)
Sodium: 137 mmol/L (ref 135–145)
Total Bilirubin: 0.8 mg/dL (ref 0.0–1.2)
Total Protein: 6.5 g/dL (ref 6.5–8.1)

## 2024-04-11 MED ORDER — IOHEXOL 300 MG/ML  SOLN
100.0000 mL | Freq: Once | INTRAMUSCULAR | Status: AC | PRN
  Administered 2024-04-11: 100 mL via INTRAVENOUS

## 2024-04-17 NOTE — Progress Notes (Signed)
 Pt called [facility] to inquire if there was a different appt time available for his follow up with Dr. Sherrod on 10/21. Pts appt is currently at 2pm, however he has an appt at a different location at 230. [facility] was able to secure 930 appt with Cassie H. Scheduler confirmed new appt time. NN called the pt back to let him know his appt has been changed to 930 on 10/21.

## 2024-04-17 NOTE — Progress Notes (Unsigned)
 [facility] OFFICE PROGRESS NOTE  Larry Baller, MD [address] [place] [address]  DIAGNOSIS: 1) Metastatic poorly differentiated carcinoma, neuroendocrine carcinoma of suspicious prostate primary versus primary lung cancer presented with innumerable and bilateral pulmonary nodules in addition to right hilar and mediastinal lymphadenopathy and metastatic disease to several areas of the bone in addition to hypermetabolic activity in the prostate gland diagnosed in January 2024  2) prostate adenocarcinoma diagnosed in February 2024 currently managed by Dr. Nieves  PRIOR THERAPY: Palliative systemic chemotherapy with carboplatin  for AUC of 5 and Imfinzi  1500 Mg IV on day 1 as well as etoposide  100 Mg/M2 on days 1, 2 and 3 with Neulasta  support on day 5. Status post 7 cycles. First dose on 08/11/22.  Starting from cycle #2 his carboplatin  will be reduced to AUC of 4 and 2 etoposide  80 Mg/M2 secondary to intolerance and cycle #1.  Starting from cycle #5 he will be on maintenance treatment with single agent Imfinzi  1500 Mg IV every 4 weeks.   CURRENT THERAPY: Observation ***  INTERVAL HISTORY: Larry Kent 78 y.o. male returns clinic today for follow-up visit.  The patient was last seen by Dr. Sherrod on 01/16/2024.  The patient is followed for his history of metastatic poorly differentiated carcinoma.  He also has history of prostate cancer.  In summary the patient underwent palliative systemic chemotherapy with carboplatin  and etoposide  for 4 cycles followed by maintenance Imfinzi  had concerns for elevated LFTs due to his immunotherapy.  Ever he is currently off treatment at this time and has been feeling ***.   Continues to have fatigue.  He also has baseline dyspnea on exertion.  Of breath?  He denies any unusual cough, hemoptysis, or chest pain.  Denies any fever, chills, or night sweats.  Appetite?  Denies any nausea, vomiting, diarrhea, or constipation.  Denies  any rashes or skin changes.  He recently had a restaging CT scan.  He is here today for evaluation to review his scan results and discuss the next steps.   MEDICAL HISTORY: Past Medical History:  Diagnosis Date   Allergy    Arthritis    both knees    BCC (basal cell carcinoma), arm, right 09/2019   MOHS ([place])   BCC (basal cell carcinoma), face 2014   L preauricular s/p MOHS   BCC (basal cell carcinoma), face 02/2018   L upper lip   BPH (benign prostatic hypertrophy)    Dr. Nieves @ [facility]   Carotid stenosis 05/2015   RICA 40-59%, LICA 1-39%, L vertebral occlusion, rpt 1 yr   Cataract    removed both eyes    Diabetes mellitus without complication (HCC)    FHx: colon cancer    FHx: prostate cancer    Heart murmur    mild aortic stenosis    History of kidney stones    Hyperlipidemia    borderline- on rosuvastatin  now normal    Lung cancer (HCC) 07/2022   Myocardial infarction (HCC) 09/07/2022   Occlusion of right vertebral artery 05/2015   Prostate cancer (HCC) 07/2022    ALLERGIES:  has no known allergies.  MEDICATIONS:  Current Outpatient Medications  Medication Sig Dispense Refill   Accu-Chek Softclix Lancets lancets SMARTSIG:Topical     acetaminophen  (TYLENOL ) 500 MG tablet Take 1,000 mg by mouth every 6 (six) hours as needed for mild pain or headache.     aspirin  EC 81 MG tablet Take 1 tablet (81 mg total) by  mouth daily. Swallow whole.     Blood Glucose Monitoring Suppl DEVI 1 each by Does not apply route in the morning, at noon, and at bedtime. E11.69. May substitute to any manufacturer covered by patient's insurance. 1 each 0   Calcium  Carb-Cholecalciferol (RA CALCIUM  PLUS VITAMIN D ) 600-10 MG-MCG TABS Take 600 mg by mouth daily.     Cholecalciferol (VITAMIN D3) 25 MCG (1000 UT) CAPS Take 1 capsule (1,000 Units total) by mouth daily.     clopidogrel  (PLAVIX ) 75 MG tablet TAKE 1 TABLET BY MOUTH EVERY DAY 90 tablet 2   fluticasone  (FLONASE ) 50 MCG/ACT nasal  spray Place 1 spray into both nostrils daily as needed for allergies or rhinitis.     Lancets Misc. (ACCU-CHEK SOFTCLIX LANCET DEV) KIT USE TO CHECK BLOOD SUGAR IN THE MORNING, AT NOON, AND AT BEDTIME     Multiple Vitamin (MULTIVITAMIN) tablet Take 1 tablet by mouth daily with breakfast.     NUBEQA 300 MG tablet Take 600 mg by mouth 2 (two) times daily.     potassium chloride  (KLOR-CON ) 10 MEQ tablet Take 1 tablet (10 mEq total) by mouth as needed. (Patient taking differently: Take 10 mEq by mouth daily.) 90 tablet 3   rosuvastatin  (CRESTOR ) 5 MG tablet TAKE 1 TABLET (5 MG TOTAL) BY MOUTH DAILY. 90 tablet 1   tamsulosin  (FLOMAX ) 0.4 MG CAPS capsule Take 0.4 mg by mouth 2 (two) times daily.     No current facility-administered medications for this visit.    SURGICAL HISTORY:  Past Surgical History:  Procedure Laterality Date   BASAL CELL CARCINOMA EXCISION  05/2018   lip, 2020 x3  basal cells removed    CATARACT EXTRACTION  2008   Left   CATARACT EXTRACTION  2013   Right ([place])   COLONOSCOPY  ~2010   medium int hemorrhoids, o/w WNL, rpt 5 yrs given fmhx (Dr. Celestia)   COLONOSCOPY  08/2019   TAs, diverticulosis, rpt 3 yrs ([place])   COLONOSCOPY  12/2023   2TAs, diverticulosis, int hem, no f/u needed ([place])   CORONARY/GRAFT ACUTE MI REVASCULARIZATION N/A 09/07/2022   Procedure: Coronary/Graft Acute MI Revascularization;  Surgeon: Wendel Lurena POUR, MD;  Location: [facility];  Service: Cardiovascular;  Laterality: N/A;   ENDARTERECTOMY Right 12/01/2019   Procedure: RIGHT ENDARTERECTOMY CAROTID;  Surgeon: Gretta Lonni PARAS, MD;  Location: [facility] OR;  Service: Vascular;  Laterality: Right;   exercise treadmill  11/2005   WNL ([place])   FINE NEEDLE ASPIRATION  07/28/2022   Procedure: FINE NEEDLE ASPIRATION (FNA) LINEAR;  Surgeon: Brenna Adine CROME, DO;  Location: [facility] ENDOSCOPY;  Service: Pulmonary;;   HERNIA REPAIR  2008   Right   IR IMAGING GUIDED PORT INSERTION   08/11/2022   IR IMAGING GUIDED PORT INSERTION  10/08/2022   IR REMOVAL TUN ACCESS W/ PORT W/O FL MOD SED  09/10/2022   LEFT HEART CATH AND CORONARY ANGIOGRAPHY N/A 09/07/2022   Procedure: LEFT HEART CATH AND CORONARY ANGIOGRAPHY;  Surgeon: Wendel Lurena POUR, MD;  Location: [facility];  Service: Cardiovascular;  Laterality: N/A;   MOHS SURGERY Left spring 2014   basal cell face   PATCH ANGIOPLASTY Right 12/01/2019   Procedure: PATCH ANGIOPLASTY USING GEORGE BIOLOGIC PATCH;  Surgeon: Gretta Lonni PARAS, MD;  Location: [facility] OR;  Service: Vascular;  Laterality: Right;   Testicular Biopsy  2003   benign, varicocele   US  ECHOCARDIOGRAPHY  11/2005   aortic sclerosis, EF 55-60%, diastolic dysfunction  VIDEO BRONCHOSCOPY WITH ENDOBRONCHIAL ULTRASOUND Bilateral 07/28/2022   Procedure: VIDEO BRONCHOSCOPY WITH ENDOBRONCHIAL ULTRASOUND;  Surgeon: Brenna Adine CROME, DO;  Location: [facility]OS[facility];  Service: Pulmonary;  Laterality: Bilateral;    REVIEW OF SYSTEMS:   Review of Systems  Constitutional: Negative for appetite change, chills, fatigue, fever and unexpected weight change.  HENT:   Negative for mouth sores, nosebleeds, sore throat and trouble swallowing.   Eyes: Negative for eye problems and icterus.  Respiratory: Negative for cough, hemoptysis, shortness of breath and wheezing.   Cardiovascular: Negative for chest pain and leg swelling.  Gastrointestinal: Negative for abdominal pain, constipation, diarrhea, nausea and vomiting.  Genitourinary: Negative for bladder incontinence, difficulty urinating, dysuria, frequency and hematuria.   Musculoskeletal: Negative for back pain, gait problem, neck pain and neck stiffness.  Skin: Negative for itching and rash.  Neurological: Negative for dizziness, extremity weakness, gait problem, headaches, light-headedness and seizures.  Hematological: Negative for adenopathy. Does not bruise/bleed easily.  Psychiatric/Behavioral: Negative for confusion,  depression and sleep disturbance. The patient is not nervous/anxious.     PHYSICAL EXAMINATION:  There were no vitals taken for this visit.  ECOG PERFORMANCE STATUS: {CHL ONC ECOG [id number]  Physical Exam  Constitutional: Oriented to person, place, and time and well-developed, well-nourished, and in no distress. No distress.  HENT:  Head: Normocephalic and atraumatic.  Mouth/Throat: Oropharynx is clear and moist. No oropharyngeal exudate.  Eyes: Conjunctivae are normal. Right eye exhibits no discharge. Left eye exhibits no discharge. No scleral icterus.  Neck: Normal range of motion. Neck supple.  Cardiovascular: Normal rate, regular rhythm, normal heart sounds and intact distal pulses.   Pulmonary/Chest: Effort normal and breath sounds normal. No respiratory distress. No wheezes. No rales.  Abdominal: Soft. Bowel sounds are normal. Exhibits no distension and no mass. There is no tenderness.  Musculoskeletal: Normal range of motion. Exhibits no edema.  Lymphadenopathy:    No cervical adenopathy.  Neurological: Alert and oriented to person, place, and time. Exhibits normal muscle tone. Gait normal. Coordination normal.  Skin: Skin is warm and dry. No rash noted. Not diaphoretic. No erythema. No pallor.  Psychiatric: Mood, memory and judgment normal.  Vitals reviewed.  LABORATORY DATA: Lab Results  Component Value Date   WBC 4.9 04/11/2024   HGB 11.9 (L) 04/11/2024   HCT 34.0 (L) 04/11/2024   MCV 100.6 (H) 04/11/2024   PLT 144 (L) 04/11/2024      Chemistry      Component Value Date/Time   NA 137 04/11/2024 1201   NA 140 05/31/2023 1417   K 4.0 04/11/2024 1201   CL 106 04/11/2024 1201   CO2 27 04/11/2024 1201   BUN 18 04/11/2024 1201   BUN 14 05/31/2023 1417   CREATININE 0.88 04/11/2024 1201      Component Value Date/Time   CALCIUM  9.4 04/11/2024 1201   ALKPHOS 441 (H) 04/11/2024 1201   AST 38 04/11/2024 1201   ALT 32 04/11/2024 1201   BILITOT 0.8 04/11/2024  1201       RADIOGRAPHIC STUDIES:  CT CHEST ABDOMEN PELVIS W CONTRAST Result Date: 04/13/2024 CLINICAL DATA:  Non-small-cell lung cancer restaging, lung and prostate cancer, chemotherapy and immunotherapy complete * Tracking Code: BO * EXAM: CT CHEST, ABDOMEN, AND PELVIS WITH CONTRAST TECHNIQUE: Multidetector CT imaging of the chest, abdomen and pelvis was performed following the standard protocol during bolus administration of intravenous contrast. RADIATION DOSE REDUCTION: This exam was performed according to the departmental dose-optimization program which includes automated exposure control, adjustment  of the mA and/or kV according to patient size and/or use of iterative reconstruction technique. CONTRAST:  OMNIPAQUE  IOHEXOL  300 MG/ML  SOLN COMPARISON:  01/14/2024 FINDINGS: CT CHEST FINDINGS Cardiovascular: Left chest port catheter. Aortic atherosclerosis. Dense aortic valve calcifications. Normal heart size. Three-vessel coronary artery calcifications. No pericardial effusion. Mediastinum/Nodes: No enlarged mediastinal, hilar, or axillary lymph nodes. Thyroid  gland, trachea, and esophagus demonstrate no significant findings. Lungs/Pleura: Numerous small bilateral pulmonary nodules not significantly changed, for example in the anterior right middle lobe a spiculated nodule measuring 1.6 x 1.4 cm (series 6, image 111) and in the posterior right upper lobe an index nodule measuring 0.7 cm (series 6, image 82). Slightly increased volume of a small left pleural effusion. Musculoskeletal: No chest wall abnormality. No acute osseous findings. CT ABDOMEN PELVIS FINDINGS Hepatobiliary: No solid liver abnormality is seen. Coarse contour of the liver. No gallstones, gallbladder wall thickening, or biliary dilatation. Pancreas: Unremarkable. No pancreatic ductal dilatation or surrounding inflammatory changes. Spleen: Normal in size without significant abnormality. Adrenals/Urinary Tract: Adrenal glands are  unremarkable. Kidneys are normal, without renal calculi, solid lesion, or hydronephrosis. Unchanged, left eccentric bladder wall thickening (series 2, image 118) Stomach/Bowel: Stomach is within normal limits. Appendix appears normal. No evidence of bowel wall thickening, distention, or inflammatory changes. Sigmoid diverticula. Vascular/Lymphatic: Aortic atherosclerosis. No enlarged abdominal or pelvic lymph nodes. Reproductive: Mild prostatomegaly. Other: No abdominal wall hernia or abnormality. No ascites. Musculoskeletal: No acute osseous findings. Similar appearance of scattered sclerotic osseous metastases of the vertebral bodies. Interval increase in sclerosis of osseous metastases involving the right hemisacrum and adjacent right ilium (series 2, image 104). There is however interval enlargement of a soft tissue component associated with a lytic metastasis of the left inferior pubic ramus, on today's examination measuring 2.0 x 1.4 cm, previously no greater than 1.4 x 0.8 cm (series 2, image 131). IMPRESSION: 1. Numerous small bilateral pulmonary nodules not significantly changed. 2. Slightly increased volume of a small left pleural effusion. 3. Similar appearance of scattered sclerotic osseous metastases of the vertebral bodies. Interval increase in sclerosis of osseous metastases involving the right hemisacrum and adjacent right ilium probably reflecting sclerotic treatment response. 4. There is however interval enlargement of a soft tissue component associated with a lytic metastasis of the left inferior pubic ramus. 5. PET-CT may be helpful to assess for degree of persistent metabolically active osseous metastatic disease. 6. Unchanged, left eccentric bladder wall thickening, probably related to local radiation therapy in the setting of prostate cancer. 7. Coarse contour of the liver, suggestive of cirrhosis. 8. Coronary artery disease. 9. Dense aortic valve calcifications. Correlate for  echocardiographic evidence of aortic valve dysfunction. Aortic Atherosclerosis (ICD10-I70.0). Electronically Signed   By: Marolyn JONETTA Jaksch M.D.   On: 04/13/2024 07:42     ASSESSMENT/PLAN:  This is a very pleasant 78 year old Caucasian male recently diagnosed with  1) metastatic poorly differentiated carcinoma, neuroendocrine carcinoma of suspicious prostate primary versus primary lung cancer. He presented with innumerable and bilateral pulmonary nodules in addition to right hilar and mediastinal lymphadenopathy and metastatic disease to several areas of the bone diagnosed in January 2024. 2) Prostate adenocarcinoma diagnosed in February 2024 currently managed by Dr. Nieves  He was started on systemic chemotherapy with carboplatin  for an AUC of 5 on day 1, etoposide  100 mg/m on days 1, 2, and 3 and immunotherapy with Imfinzi  1500 mg IV every 3 weeks with Neulasta  support.  He is status post 4 cycles of treatment. Starting from  cycle #5, he started maintenance immunotherapy with Imfinzi  1500 mg IV every 4 weeks. This was discontinued due to immunotherapy mediated hepatitis.   The patient was seen with Dr. Sherrod today.  Dr. Sherrod personally and independently reviewed the scan and discussed results with the patient today.  The scan showed ***.  Dr. Sherrod recommends ***  Given the severeity of the hepatotoxicity, would not rechallenge with immunotherapy.   ***PET scan?  The patient was seen with Dr. Sherrod.  Dr. Sherrod had a lengthy discussion with the patient about his current condition and treatment options.  Dr. Sherrod recommends referral to ***consideration of ****  ***The patient is interested in this option and we will arrange for referral.  Will see him back for labs and a follow-up visit in ***   ***Ask if has cardiologist for heart valve. Apparently had echo in march and was mild 2024***.   The patient was advised to call immediately if he has any concerning symptoms in the  interval. The patient voices understanding of current disease status and treatment options and is in agreement with the current care plan. All questions were answered. The patient knows to call the clinic with any problems, questions or concerns. We can certainly see the patient much sooner if necessary      No orders of the defined types were placed in this encounter.    I spent {CHL ONC TIME VISIT - DTPQU:[id number]} counseling the patient face to face. The total time spent in the appointment was {CHL ONC TIME VISIT - DTPQU:[id number]}.  Larry Insco L Pieper Kasik, PA-C 04/17/24

## 2024-04-18 ENCOUNTER — Inpatient Hospital Stay: Admitting: Internal Medicine

## 2024-04-18 ENCOUNTER — Inpatient Hospital Stay

## 2024-04-18 ENCOUNTER — Inpatient Hospital Stay: Admitting: Physician Assistant

## 2024-04-18 VITALS — BP 136/64 | HR 68 | Temp 97.2°F | Resp 17 | Ht 68.0 in | Wt 189.0 lb

## 2024-04-18 DIAGNOSIS — C61 Malignant neoplasm of prostate: Secondary | ICD-10-CM

## 2024-04-18 DIAGNOSIS — C7A1 Malignant poorly differentiated neuroendocrine tumors: Secondary | ICD-10-CM

## 2024-04-18 DIAGNOSIS — Z5112 Encounter for antineoplastic immunotherapy: Secondary | ICD-10-CM | POA: Diagnosis not present

## 2024-04-25 ENCOUNTER — Encounter (HOSPITAL_COMMUNITY)
Admission: RE | Admit: 2024-04-25 | Discharge: 2024-04-25 | Disposition: A | Discharge status: Home / Self Care | Source: Ambulatory Visit | Attending: Physician Assistant | Admitting: Physician Assistant

## 2024-04-25 DIAGNOSIS — C61 Malignant neoplasm of prostate: Secondary | ICD-10-CM | POA: Insufficient documentation

## 2024-04-25 DIAGNOSIS — C7A1 Malignant poorly differentiated neuroendocrine tumors: Secondary | ICD-10-CM | POA: Insufficient documentation

## 2024-04-25 MED ORDER — FLOTUFOLASTAT F 18 GALLIUM 296-5846 MBQ/ML IV SOLN
8.5400 | Freq: Once | INTRAVENOUS | Status: AC
  Administered 2024-04-25: 8.54 via INTRAVENOUS

## 2024-05-08 ENCOUNTER — Other Ambulatory Visit: Payer: Self-pay

## 2024-05-08 DIAGNOSIS — C3490 Malignant neoplasm of unspecified part of unspecified bronchus or lung: Secondary | ICD-10-CM

## 2024-05-09 ENCOUNTER — Inpatient Hospital Stay: Attending: Internal Medicine

## 2024-05-09 ENCOUNTER — Inpatient Hospital Stay: Admitting: Internal Medicine

## 2024-05-09 VITALS — BP 126/64 | HR 76 | Temp 97.8°F | Resp 17 | Ht 68.0 in | Wt 194.0 lb

## 2024-05-09 DIAGNOSIS — C7A1 Malignant poorly differentiated neuroendocrine tumors: Secondary | ICD-10-CM

## 2024-05-09 DIAGNOSIS — C3490 Malignant neoplasm of unspecified part of unspecified bronchus or lung: Secondary | ICD-10-CM

## 2024-05-09 DIAGNOSIS — C7951 Secondary malignant neoplasm of bone: Secondary | ICD-10-CM | POA: Insufficient documentation

## 2024-05-09 DIAGNOSIS — G893 Neoplasm related pain (acute) (chronic): Secondary | ICD-10-CM | POA: Insufficient documentation

## 2024-05-09 DIAGNOSIS — C61 Malignant neoplasm of prostate: Secondary | ICD-10-CM | POA: Insufficient documentation

## 2024-05-09 LAB — CBC WITH DIFFERENTIAL (CANCER CENTER ONLY)
Abs Immature Granulocytes: 0.02 K/uL (ref 0.00–0.07)
Basophils Absolute: 0.1 K/uL (ref 0.0–0.1)
Basophils Relative: 1 %
Eosinophils Absolute: 0.1 K/uL (ref 0.0–0.5)
Eosinophils Relative: 1 %
HCT: 34.5 % — ABNORMAL LOW (ref 39.0–52.0)
Hemoglobin: 12.1 g/dL — ABNORMAL LOW (ref 13.0–17.0)
Immature Granulocytes: 0 %
Lymphocytes Relative: 10 %
Lymphs Abs: 0.8 K/uL (ref 0.7–4.0)
MCH: 35.1 pg — ABNORMAL HIGH (ref 26.0–34.0)
MCHC: 35.1 g/dL (ref 30.0–36.0)
MCV: 100 fL (ref 80.0–100.0)
Monocytes Absolute: 0.3 K/uL (ref 0.1–1.0)
Monocytes Relative: 4 %
Neutro Abs: 6.5 K/uL (ref 1.7–7.7)
Neutrophils Relative %: 84 %
Platelet Count: 154 K/uL (ref 150–400)
RBC: 3.45 MIL/uL — ABNORMAL LOW (ref 4.22–5.81)
RDW: 13.5 % (ref 11.5–15.5)
WBC Count: 7.8 K/uL (ref 4.0–10.5)
nRBC: 0 % (ref 0.0–0.2)

## 2024-05-09 LAB — CMP (CANCER CENTER ONLY)
ALT: 21 U/L (ref 0–44)
AST: 27 U/L (ref 15–41)
Albumin: 3.8 g/dL (ref 3.5–5.0)
Alkaline Phosphatase: 426 U/L — ABNORMAL HIGH (ref 38–126)
Anion gap: 6 (ref 5–15)
BUN: 20 mg/dL (ref 8–23)
CO2: 25 mmol/L (ref 22–32)
Calcium: 9.4 mg/dL (ref 8.9–10.3)
Chloride: 107 mmol/L (ref 98–111)
Creatinine: 0.84 mg/dL (ref 0.61–1.24)
GFR, Estimated: >60 mL/min (ref >60)
Glucose, Bld: 165 mg/dL — ABNORMAL HIGH (ref 70–99)
Potassium: 4.3 mmol/L (ref 3.5–5.1)
Sodium: 138 mmol/L (ref 135–145)
Total Bilirubin: 0.5 mg/dL (ref 0.0–1.2)
Total Protein: 6.7 g/dL (ref 6.5–8.1)

## 2024-05-09 MED ORDER — PROCHLORPERAZINE MALEATE 10 MG PO TABS: 10.0000 mg | ORAL_TABLET | Freq: Four times a day (QID) | ORAL | 1 refills | Status: AC | PRN

## 2024-05-09 MED ORDER — LIDOCAINE-PRILOCAINE 2.5-2.5 % EX CREA: TOPICAL_CREAM | CUTANEOUS | 3 refills | Status: AC

## 2024-05-09 MED ORDER — DEXAMETHASONE 4 MG PO TABS: ORAL_TABLET | ORAL | 1 refills | Status: AC

## 2024-05-09 MED ORDER — PREDNISONE 5 MG PO TABS: 5.0000 mg | ORAL_TABLET | Freq: Two times a day (BID) | ORAL | 11 refills | Status: AC

## 2024-05-09 MED ORDER — ONDANSETRON HCL 8 MG PO TABS: 8.0000 mg | ORAL_TABLET | Freq: Three times a day (TID) | ORAL | 1 refills | Status: AC | PRN

## 2024-05-09 NOTE — Progress Notes (Signed)
START ON PATHWAY REGIMEN - Prostate ? ? ?  A cycle is every 21 days: ?    Prednisone  ?    Docetaxel  ? ?**Always confirm dose/schedule in your pharmacy ordering system** ? ?Patient Characteristics: ?Adenocarcinoma, Recurrent/New Systemic Disease (Including Biochemical Recurrence), Castration Resistant, M1, Prior Novel Hormonal Agent, No Molecular Alteration or Targeted Therapy Exhausted, No Prior Docetaxel ?Histology: Adenocarcinoma ?Therapeutic Status: Recurrent/New Systemic Disease (Including Biochemical Recurrence) ? ?Intent of Therapy: ?Non-Curative / Palliative Intent, Discussed with Patient ?

## 2024-05-09 NOTE — Progress Notes (Signed)
 [name]:([phone]) [phone]   Fax:([phone]) [phone]  OFFICE PROGRESS NOTE  Larry Baller, MD [address] [place] [address]  DIAGNOSIS:  1) Metastatic poorly differentiated carcinoma, neuroendocrine carcinoma of suspicious prostate primary versus primary lung cancer presented with innumerable and bilateral pulmonary nodules in addition to right hilar and mediastinal lymphadenopathy and metastatic disease to several areas of the bone in addition to hypermetabolic activity in the prostate gland diagnosed in January 2024  2) prostate adenocarcinoma diagnosed in February 2024 currently managed by Dr. Nieves   PRIOR THERAPY: Palliative systemic chemotherapy with carboplatin  for AUC of 5 and Imfinzi  1500 Mg IV on day 1 as well as etoposide  100 Mg/M2 on days 1, 2 and 3 with Neulasta  support on day 5. Status post 7 cycles. First dose on 08/11/22.  Starting from cycle #2 his carboplatin  will be reduced to AUC of 4 and 2 etoposide  80 Mg/M2 secondary to intolerance and cycle #1.  Starting from cycle #5 he will be on maintenance treatment with single agent Imfinzi  1500 Mg IV every 4 weeks.   CURRENT THERAPY: Hormonal therapy with Eligard 30 mg intramuscular every 4 months in addition to Zytiga orally and docetaxel 75 mg/M2 every 3 weeks as well as prednisone  5 mg p.o. twice daily.  First dose December 2025.  INTERVAL HISTORY: Larry Kent 78 y.o. male returns to the clinic today for follow-up visit accompanied by his daughter Larry Kent.Discussed the use of AI scribe software for clinical note transcription with the patient, who gave verbal consent to proceed.  History of Present Illness Larry Kent is a 78 year old male with metastatic polydifferentiated carcinoma and neuroendocrine carcinoma who presents for evaluation after a repeat PMSA PET scan. He is accompanied by his daughter, Larry Kent.  He was diagnosed with metastatic polydifferentiated carcinoma and  neuroendocrine carcinoma, suspicious of prostate primary, in January 2024. He also has a history of metastatic prostate adenocarcinoma diagnosed in February 2020. He was previously treated with systemic chemo immunotherapy including carboplatin , etoposide , and durvalumab  every three weeks for four cycles, followed by maintenance durvalumab  for three more cycles, which was discontinued due to intolerance. He has been on observation since then.  For his prostate cancer, he is currently on androgen deprivation therapy (ADT) in addition to abiraterone and prednisone , managed by his urologist. He has not received an Eligard injection in the past six months and is currently only on oral medications. He experiences shortness of breath and knee pain, which he attributes to arthritis. He describes additional pain in his thigh and buttock, which he initially thought might be sciatica.  He feels 'short winded' and 'out of breath' when climbing stairs. He is currently taking abiraterone (Zytiga) at a dose of 250 mg, four tablets daily, and prednisone . The medication affects his energy levels, causing fatigue. He also takes Tylenol  and ibuprofen  every four hours for pain management.  A recent PMSA PET scan showed widespread bone metastasis, including in the sacrum, bilateral iliac bones, pubic ramus, L1 vertebral body, and T6 vertebral body. He is concerned about the bone metastasis and its impact on his health.     MEDICAL HISTORY: Past Medical History:  Diagnosis Date   Allergy    Arthritis    both knees    BCC (basal cell carcinoma), arm, right 09/2019   MOHS ([place])   BCC (basal cell carcinoma), face 2014   L preauricular s/p MOHS   BCC (basal cell carcinoma), face  02/2018   L upper lip   BPH (benign prostatic hypertrophy)    Dr. Nieves @ [facility]   Carotid stenosis 05/2015   RICA 40-59%, LICA 1-39%, L vertebral occlusion, rpt 1 yr   Cataract    removed both eyes    Diabetes mellitus without  complication (HCC)    FHx: colon cancer    FHx: prostate cancer    Heart murmur    mild aortic stenosis    History of kidney stones    Hyperlipidemia    borderline- on rosuvastatin  now normal    Lung cancer (HCC) 07/2022   Myocardial infarction (HCC) 09/07/2022   Occlusion of right vertebral artery 05/2015   Prostate cancer (HCC) 07/2022    ALLERGIES:  has no known allergies.  MEDICATIONS:  Current Outpatient Medications  Medication Sig Dispense Refill   abiraterone acetate (ZYTIGA) 250 MG tablet Take 1,000 mg by mouth daily.     Accu-Chek Softclix Lancets lancets SMARTSIG:Topical     acetaminophen  (TYLENOL ) 500 MG tablet Take 1,000 mg by mouth every 6 (six) hours as needed for mild pain or headache.     aspirin  EC 81 MG tablet Take 1 tablet (81 mg total) by mouth daily. Swallow whole.     Blood Glucose Monitoring Suppl DEVI 1 each by Does not apply route in the morning, at noon, and at bedtime. E11.69. May substitute to any manufacturer covered by patient's insurance. 1 each 0   Calcium  Carbonate-Vit D-Min (CALCIUM  600+D PLUS MINERALS) 600-400 MG-UNIT TABS Take 600 mg by mouth daily.     Cholecalciferol (VITAMIN D3) 25 MCG (1000 UT) CAPS Take 1 capsule (1,000 Units total) by mouth daily.     clopidogrel  (PLAVIX ) 75 MG tablet TAKE 1 TABLET BY MOUTH EVERY DAY 90 tablet 2   fluticasone  (FLONASE ) 50 MCG/ACT nasal spray Place 1 spray into both nostrils daily as needed for allergies or rhinitis.     HYDROcodone -acetaminophen  (NORCO/VICODIN) 5-325 MG tablet Take 1 tablet by mouth every 6 (six) hours.     Lancets Misc. (ACCU-CHEK SOFTCLIX LANCET DEV) KIT USE TO CHECK BLOOD SUGAR IN THE MORNING, AT NOON, AND AT BEDTIME     Multiple Vitamin (MULTIVITAMIN) tablet Take 1 tablet by mouth daily with breakfast.     omeprazole  (PRILOSEC) 20 MG capsule Take 20 mg by mouth as needed.     potassium chloride  (KLOR-CON ) 10 MEQ tablet Take 1 tablet (10 mEq total) by mouth as needed. 90 tablet 3    predniSONE  (DELTASONE ) 5 MG tablet Take 5 mg by mouth daily.     rosuvastatin  (CRESTOR ) 5 MG tablet TAKE 1 TABLET (5 MG TOTAL) BY MOUTH DAILY. 90 tablet 1   tamsulosin  (FLOMAX ) 0.4 MG CAPS capsule Take 0.4 mg by mouth 2 (two) times daily.     No current facility-administered medications for this visit.    SURGICAL HISTORY:  Past Surgical History:  Procedure Laterality Date   BASAL CELL CARCINOMA EXCISION  05/2018   lip, 2020 x3  basal cells removed    CATARACT EXTRACTION  2008   Left   CATARACT EXTRACTION  2013   Right ([place])   COLONOSCOPY  ~2010   medium int hemorrhoids, o/w WNL, rpt 5 yrs given fmhx (Dr. Celestia)   COLONOSCOPY  08/2019   TAs, diverticulosis, rpt 3 yrs ([place])   COLONOSCOPY  12/2023   2TAs, diverticulosis, int hem, no f/u needed (Armbruster)   CORONARY/GRAFT ACUTE MI REVASCULARIZATION N/A 09/07/2022   Procedure: Coronary/Graft Acute MI Revascularization;  Surgeon: Thukkani, Arun K, MD;  Location: [facility] INVASIVE CV LAB;  Service: Cardiovascular;  Laterality: N/A;   ENDARTERECTOMY Right 12/01/2019   Procedure: RIGHT ENDARTERECTOMY CAROTID;  Surgeon: Gretta Lonni PARAS, MD;  Location: [facility] OR;  Service: Vascular;  Laterality: Right;   exercise treadmill  11/2005   WNL ([place])   FINE NEEDLE ASPIRATION  07/28/2022   Procedure: FINE NEEDLE ASPIRATION (FNA) LINEAR;  Surgeon: Brenna Adine CROME, DO;  Location: [facility] ENDOSCOPY;  Service: Pulmonary;;   HERNIA REPAIR  2008   Right   IR IMAGING GUIDED PORT INSERTION  08/11/2022   IR IMAGING GUIDED PORT INSERTION  10/08/2022   IR REMOVAL TUN ACCESS W/ PORT W/O FL MOD SED  09/10/2022   LEFT HEART CATH AND CORONARY ANGIOGRAPHY N/A 09/07/2022   Procedure: LEFT HEART CATH AND CORONARY ANGIOGRAPHY;  Surgeon: Wendel Lurena POUR, MD;  Location: [facility] INVASIVE CV LAB;  Service: Cardiovascular;  Laterality: N/A;   MOHS SURGERY Left spring 2014   basal cell face   PATCH ANGIOPLASTY Right 12/01/2019   Procedure: PATCH ANGIOPLASTY USING  XENOSURE BIOLOGIC PATCH;  Surgeon: Gretta Lonni PARAS, MD;  Location: [facility] OR;  Service: Vascular;  Laterality: Right;   Testicular Biopsy  2003   benign, varicocele   US  ECHOCARDIOGRAPHY  11/2005   aortic sclerosis, EF 55-60%, diastolic dysfunction   VIDEO BRONCHOSCOPY WITH ENDOBRONCHIAL ULTRASOUND Bilateral 07/28/2022   Procedure: VIDEO BRONCHOSCOPY WITH ENDOBRONCHIAL ULTRASOUND;  Surgeon: Brenna Adine CROME, DO;  Location: [facility] ENDOSCOPY;  Service: Pulmonary;  Laterality: Bilateral;    REVIEW OF SYSTEMS:  Constitutional: positive for fatigue and weight loss Eyes: negative Ears, nose, mouth, throat, and face: negative Respiratory: positive for dyspnea on exertion Cardiovascular: negative Gastrointestinal: negative Genitourinary:negative Integument/breast: negative Hematologic/lymphatic: negative Musculoskeletal:positive for bone pain Neurological: negative Behavioral/Psych: negative Endocrine: negative Allergic/Immunologic: negative   PHYSICAL EXAMINATION: General appearance: alert, cooperative, fatigued, and no distress Head: Normocephalic, without obvious abnormality, atraumatic Neck: no adenopathy, no JVD, supple, symmetrical, trachea midline, and thyroid  not enlarged, symmetric, no tenderness/mass/nodules Lymph nodes: Cervical, supraclavicular, and axillary nodes normal. Resp: clear to auscultation bilaterally Back: symmetric, no curvature. ROM normal. No CVA tenderness. Cardio: regular rate and rhythm, S1, S2 normal, no murmur, click, rub or gallop GI: soft, non-tender; bowel sounds normal; no masses,  no organomegaly Extremities: extremities normal, atraumatic, no cyanosis or edema Neurologic: Alert and oriented X 3, normal strength and tone. Normal symmetric reflexes. Normal coordination and gait  ECOG PERFORMANCE STATUS: 1 - Symptomatic but completely ambulatory  Blood pressure 126/64, pulse 76, temperature 97.8 F (36.6 C), temperature source Temporal, resp. rate 17,  height 5' 8 (1.727 m), weight 194 lb (88 kg), SpO2 98%.  LABORATORY DATA: Lab Results  Component Value Date   WBC 7.8 05/09/2024   HGB 12.1 (L) 05/09/2024   HCT 34.5 (L) 05/09/2024   MCV 100.0 05/09/2024   PLT 154 05/09/2024      Chemistry      Component Value Date/Time   NA 137 04/11/2024 1201   NA 140 05/31/2023 1417   K 4.0 04/11/2024 1201   CL 106 04/11/2024 1201   CO2 27 04/11/2024 1201   BUN 18 04/11/2024 1201   BUN 14 05/31/2023 1417   CREATININE 0.88 04/11/2024 1201      Component Value Date/Time   CALCIUM  9.4 04/11/2024 1201   ALKPHOS 441 (H) 04/11/2024 1201   AST 38 04/11/2024 1201   ALT 32 04/11/2024 1201   BILITOT 0.8 04/11/2024 1201  RADIOGRAPHIC STUDIES: NM PET (PSMA) SKULL TO MID THIGH Result Date: 04/28/2024 EXAM: PROSTATE PET SKULL BASE TO MID THIGHS 04/25/2024 06:03:35 PM TECHNIQUE: RADIOPHARMACEUTICAL: 8.54 mCi F-18 piflufolastat (Pylarify) injected intravenously. PET imaging was obtained from skull vertex to mid thighs. Computed tomography was used for attenuation correction and localization. Fusion imaging was obtained. Uptake time  mins. COMPARISON: CT 04/11/2024. CLINICAL HISTORY: Prostate cancer, metastatic, assess treatment response; Lung cancer and prostate cancer, further evaluate the abnormal findings on recent CT. 8.54 mCi F18 PSMA IV RAC @ 1641 / HJ; PSMA FOR PROSTATE CANCER ; PSA = 2.49 ng/ml 01/14/2024 ; Hx of lung cancer 2024 ; EOV FINDINGS: PROSTATE AND PROSTATE BED: No significant PSMA activity within the prostate gland. LYMPH NODES: No PSMA avid abdominal or pelvic lymph nodes. BONES: There is intense PSMA activity within the near entirety of the sacrum with SUV max equal 18.3, greater activity within the central and right aspect of the sacrum which corresponds to sclerosis findings on the CT portion of the exam. Intense activity in the posterior left and right iliac bones. Focal activity within the L1 body with SUV max equal 22.9.  Sclerotic lesion evident on image 119. Intense activity in the left transverse process of the T6 vertebral body. Additionally, intense activity in the near entirety of the right inferior pubic ramus. OTHER PET FINDINGS: Multiple small pulmonary nodules not changed from recent CT exam. These pulmonary nodules do not have PSMA activity. Small left pleural effusion. Port in the anterior chest wall with tip in distal SVC. Physiologic activity within the salivary glands, liver, spleen, kidneys, bowel, and urinary bladder. IMPRESSION: 1. Widespread PSMA-avid osseous metastases involving the sacrum, bilateral posterior iliac bones, right inferior pubic ramus, L1 vertebral body, and left T6 transverse process, with corresponding sclerotic lesions on CT. 2. No psma avid metastatic lymph nodes 3. Stable small pulmonary nodules without PSMA activity. 4. Small left pleural effusion. Electronically signed by: Norleen Boxer MD 04/28/2024 09:53 AM EDT RP Workstation: [id number]   CT CHEST ABDOMEN PELVIS W CONTRAST Result Date: 04/13/2024 CLINICAL DATA:  Non-small-cell lung cancer restaging, lung and prostate cancer, chemotherapy and immunotherapy complete * Tracking Code: BO * EXAM: CT CHEST, ABDOMEN, AND PELVIS WITH CONTRAST TECHNIQUE: Multidetector CT imaging of the chest, abdomen and pelvis was performed following the standard protocol during bolus administration of intravenous contrast. RADIATION DOSE REDUCTION: This exam was performed according to the departmental dose-optimization program which includes automated exposure control, adjustment of the mA and/or kV according to patient size and/or use of iterative reconstruction technique. CONTRAST:  OMNIPAQUE  IOHEXOL  300 MG/ML  SOLN COMPARISON:  01/14/2024 FINDINGS: CT CHEST FINDINGS Cardiovascular: Left chest port catheter. Aortic atherosclerosis. Dense aortic valve calcifications. Normal heart size. Three-vessel coronary artery calcifications. No pericardial  effusion. Mediastinum/Nodes: No enlarged mediastinal, hilar, or axillary lymph nodes. Thyroid  gland, trachea, and esophagus demonstrate no significant findings. Lungs/Pleura: Numerous small bilateral pulmonary nodules not significantly changed, for example in the anterior right middle lobe a spiculated nodule measuring 1.6 x 1.4 cm (series 6, image 111) and in the posterior right upper lobe an index nodule measuring 0.7 cm (series 6, image 82). Slightly increased volume of a small left pleural effusion. Musculoskeletal: No chest wall abnormality. No acute osseous findings. CT ABDOMEN PELVIS FINDINGS Hepatobiliary: No solid liver abnormality is seen. Coarse contour of the liver. No gallstones, gallbladder wall thickening, or biliary dilatation. Pancreas: Unremarkable. No pancreatic ductal dilatation or surrounding inflammatory changes. Spleen: Normal in size without significant abnormality. Adrenals/Urinary Tract: Adrenal  glands are unremarkable. Kidneys are normal, without renal calculi, solid lesion, or hydronephrosis. Unchanged, left eccentric bladder wall thickening (series 2, image 118) Stomach/Bowel: Stomach is within normal limits. Appendix appears normal. No evidence of bowel wall thickening, distention, or inflammatory changes. Sigmoid diverticula. Vascular/Lymphatic: Aortic atherosclerosis. No enlarged abdominal or pelvic lymph nodes. Reproductive: Mild prostatomegaly. Other: No abdominal wall hernia or abnormality. No ascites. Musculoskeletal: No acute osseous findings. Similar appearance of scattered sclerotic osseous metastases of the vertebral bodies. Interval increase in sclerosis of osseous metastases involving the right hemisacrum and adjacent right ilium (series 2, image 104). There is however interval enlargement of a soft tissue component associated with a lytic metastasis of the left inferior pubic ramus, on today's examination measuring 2.0 x 1.4 cm, previously no greater than 1.4 x 0.8 cm  (series 2, image 131). IMPRESSION: 1. Numerous small bilateral pulmonary nodules not significantly changed. 2. Slightly increased volume of a small left pleural effusion. 3. Similar appearance of scattered sclerotic osseous metastases of the vertebral bodies. Interval increase in sclerosis of osseous metastases involving the right hemisacrum and adjacent right ilium probably reflecting sclerotic treatment response. 4. There is however interval enlargement of a soft tissue component associated with a lytic metastasis of the left inferior pubic ramus. 5. PET-CT may be helpful to assess for degree of persistent metabolically active osseous metastatic disease. 6. Unchanged, left eccentric bladder wall thickening, probably related to local radiation therapy in the setting of prostate cancer. 7. Coarse contour of the liver, suggestive of cirrhosis. 8. Coronary artery disease. 9. Dense aortic valve calcifications. Correlate for echocardiographic evidence of aortic valve dysfunction. Aortic Atherosclerosis (ICD10-I70.0). Electronically Signed   By: Marolyn JONETTA Jaksch M.D.   On: 04/13/2024 07:42    ASSESSMENT AND PLAN: This is a very pleasant 78 years old white male with: 1) Metastatic poorly differentiated carcinoma, neuroendocrine carcinoma of suspicious prostate primary versus primary lung cancer presented with innumerable and bilateral pulmonary nodules in addition to right hilar and mediastinal lymphadenopathy and metastatic disease to several areas of the bone in addition to hypermetabolic activity in the prostate gland diagnosed in January 2024  The patient is currently undergoing systemic chemotherapy with carboplatin  for AUC of 5 on day 1, Imfinzi  1500 Mg IV on day 1, etoposide  100 Mg/M2 on days 1, 2 and 3 with Neulasta  support every 3 weeks status post 7 cycles.  Starting from cycle #5 the patient will be on maintenance treatment with Imfinzi  1500 Mg IV every 4 weeks.  His treatment was discontinued secondary to  liver dysfunction. The patient is currently on observation and he is feeling fine with no concerning complaints.  He had repeat CT scan of the chest, abdomen and pelvis performed recently. His scan showed no concerning findings for disease progression. He had PMS a PET scan performed recently and it showed widespread PSMA avid osseous metastasis involving the sacrum, bilateral posterior iliac veins, right inferior pubic ramus, L1 vertebral body well as left T6 transverse process but no DMSA avid thoracic lymph nodes and a stable small pulmonary nodules without PSMA avid tumor and small left pleural effusion. I had a lengthy discussion with the patient and his daughter about his condition. The patient understands that he has incurable condition and all the treatment will be of palliative nature with the intention to prolong survival and palliation of his symptoms. Assessment and Plan Assessment & Plan Metastatic prostate adenocarcinoma with widespread bone metastases and associated bone pain Widespread bone metastases with significant activity in the  sacrum, bilateral iliac bones, pubic ramus, L1, and T6 vertebral bodies. Bone disease is worsening, causing significant pain, particularly in the pelvic region, likely due to nerve compression. Current treatment includes ADT, abiraterone, and prednisone . Eligard injection has not been administered in six months, which is necessary for optimal hormonal therapy. - Will restart Eligard injection in addition to current hormonal therapy. - Will initiate docetaxel chemotherapy every three weeks for at least six cycles. - Will administer GCSF injection two days post-chemotherapy to boost blood counts. - Will refer to radiation oncology for palliative radiation to symptomatic bone areas. - Will prescribe Decadron  8 mg in the morning and evening for three days prior to, during, and after chemotherapy. - Will arrange dental clearance for potential Xgeva injection to  strengthen bones.  Metastatic polydifferentiated neuroendocrine carcinoma, suspicious for prostate primary Previously treated with systemic chemoimmunotherapy, discontinued due to intolerance. Currently under observation. Recent PMSA PET scan shows widespread bone metastases, likely related to prostate cancer.  Small pleural effusion, not requiring drainage Small pleural effusion likely related to recent increase in abiraterone dosage. Not significant enough to require drainage. He was advised to call immediately if he has any other concerning symptoms in the interval.  The patient voices understanding of current disease status and treatment options and is in agreement with the current care plan.  All questions were answered. The patient knows to call the clinic with any problems, questions or concerns. We can certainly see the patient much sooner if necessary.  The total time spent in the appointment was 55 minutes.  Disclaimer: This note was dictated with voice recognition software. Similar sounding words can inadvertently be transcribed and may not be corrected upon review.

## 2024-05-10 ENCOUNTER — Telehealth: Payer: Self-pay | Admitting: Radiation Oncology

## 2024-05-10 NOTE — Telephone Encounter (Signed)
 Received call from [facility] advising pt returned call but did not indicate if he wanted [facility] or [facility]. Returned pt's call and LVM to contact [facility] to schedule appt.

## 2024-05-10 NOTE — Telephone Encounter (Signed)
 Lvm to schedule FUN with PA Muir/Dr. Dewey on pt and wife's numbers.

## 2024-05-11 ENCOUNTER — Telehealth: Payer: Self-pay | Admitting: Radiation Oncology

## 2024-05-11 NOTE — Telephone Encounter (Signed)
 Pt returned call to schedule consult. Pt agreeable to 11/14@130pm . Appt scheduled and all questions answered.

## 2024-05-12 ENCOUNTER — Ambulatory Visit
Admission: RE | Admit: 2024-05-12 | Discharge: 2024-05-12 | Disposition: A | Discharge status: Home / Self Care | Source: Ambulatory Visit | Attending: Radiology | Admitting: Radiology

## 2024-05-12 ENCOUNTER — Ambulatory Visit
Admission: RE | Admit: 2024-05-12 | Discharge: 2024-05-12 | Disposition: A | Discharge status: Home / Self Care | Source: Ambulatory Visit | Attending: Radiation Oncology | Admitting: Radiation Oncology

## 2024-05-12 ENCOUNTER — Encounter: Payer: Self-pay | Admitting: Radiology

## 2024-05-12 VITALS — BP 129/73 | HR 71 | Temp 97.5°F | Resp 18 | Ht 68.0 in | Wt 193.1 lb

## 2024-05-12 DIAGNOSIS — Z8042 Family history of malignant neoplasm of prostate: Secondary | ICD-10-CM | POA: Insufficient documentation

## 2024-05-12 DIAGNOSIS — Z801 Family history of malignant neoplasm of trachea, bronchus and lung: Secondary | ICD-10-CM | POA: Insufficient documentation

## 2024-05-12 DIAGNOSIS — Z803 Family history of malignant neoplasm of breast: Secondary | ICD-10-CM | POA: Insufficient documentation

## 2024-05-12 DIAGNOSIS — N400 Enlarged prostate without lower urinary tract symptoms: Secondary | ICD-10-CM | POA: Diagnosis not present

## 2024-05-12 DIAGNOSIS — C61 Malignant neoplasm of prostate: Secondary | ICD-10-CM | POA: Insufficient documentation

## 2024-05-12 DIAGNOSIS — Z7952 Long term (current) use of systemic steroids: Secondary | ICD-10-CM | POA: Insufficient documentation

## 2024-05-12 DIAGNOSIS — B159 Hepatitis A without hepatic coma: Secondary | ICD-10-CM | POA: Diagnosis not present

## 2024-05-12 DIAGNOSIS — E119 Type 2 diabetes mellitus without complications: Secondary | ICD-10-CM | POA: Diagnosis not present

## 2024-05-12 DIAGNOSIS — C7A1 Malignant poorly differentiated neuroendocrine tumors: Secondary | ICD-10-CM

## 2024-05-12 DIAGNOSIS — C7951 Secondary malignant neoplasm of bone: Secondary | ICD-10-CM | POA: Insufficient documentation

## 2024-05-12 DIAGNOSIS — Z85828 Personal history of other malignant neoplasm of skin: Secondary | ICD-10-CM | POA: Diagnosis not present

## 2024-05-12 DIAGNOSIS — Z800 Family history of malignant neoplasm of digestive organs: Secondary | ICD-10-CM | POA: Diagnosis not present

## 2024-05-12 DIAGNOSIS — M545 Low back pain, unspecified: Secondary | ICD-10-CM | POA: Diagnosis not present

## 2024-05-12 DIAGNOSIS — Z7982 Long term (current) use of aspirin: Secondary | ICD-10-CM | POA: Diagnosis not present

## 2024-05-12 DIAGNOSIS — Z79899 Other long term (current) drug therapy: Secondary | ICD-10-CM | POA: Insufficient documentation

## 2024-05-12 NOTE — Progress Notes (Signed)
 Histology and Location of Primary Cancer:   Sites of Visceral and Bony Metastatic Disease:     Past/Anticipated chemotherapy by medical oncology, if any:  Will restart Eligard injection in addition to current hormonal therapy. - Will initiate docetaxel chemotherapy every three weeks for at least six cycles. - Will administer GCSF injection two days post-chemotherapy to boost blood counts. - Will refer to radiation oncology for palliative radiation to symptomatic bone areas. - Will prescribe Decadron  8 mg in the morning and evening for three days prior to, during, and after chemotherapy. - Will arrange dental clearance for potential Xgeva injection to strengthen bones. Pain on a scale of 0-10 is: 1    If Spine Met(s), symptoms, if any, include: Bowel/Bladder retention or incontinence (please describe): Denies Numbness or weakness in extremities (please describe): Denies Current Decadron  regimen, if applicable: He was just prescribed 4MG   Ambulatory status? Walker? Wheelchair?: Ambulatory  SAFETY ISSUES: Prior radiation? No Pacemaker/ICD? No Possible current pregnancy? No Is the patient on methotrexate? No  Current Complaints / other details:  Asking if he is not able to drive he may need assistance.   BP 129/73 (BP Location: Left Arm, Patient Position: Sitting)   Pulse 71   Temp (!) 97.5 F (36.4 C) (Temporal)   Resp 18   Ht 5' 8 (1.727 m)   Wt 193 lb 2 oz (87.6 kg)   SpO2 95%   BMI 29.36 kg/m

## 2024-05-12 NOTE — Progress Notes (Signed)
 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    HISTORY OF PRESENT ILLNESS: Larry Kent is a 78 y.o. male seen at the request of Dr. Sherrod for painful bone metastases.  Patient was last seen in our clinic on 08/06/2022 for consideration of radiation treatment for his bone metastases.  At that time, he was asymptomatic from these lesions and decided to forego radiation therapy.  In the interim, patient underwent biopsy of the prostate on 01/04/2024 revealing adenocarcinoma of the prostate with a Gleason score of 9.  He was subsequently placed on Eligard in addition to Zytiga and docetaxel under the care of Dr. Nieves.   Under the care of Dr. Sherrod, patient was started on systemic chemotherapy with carboplatin  for an AUC of 5 on day 1, etoposide  100 mg/m on days 1, 2, and 3 and immunotherapy with Imfinzi  1500 mg IV every 3 weeks with Neulasta  support.  He is status post 4 cycles of treatment. Starting from cycle #5, he started maintenance immunotherapy with Imfinzi  1500 mg IV every 4 weeks. This was discontinued due to immunotherapy mediated hepatitis.  Most recently, patient underwent restaging CT of the chest abdomen and pelvis which showed numerous small stable bilateral pulmonary nodules, slightly increased left small pleural effusion, similar appearance of sclerotic osseous metastases, and interval enlargement of the soft tissue component of the lytic metastasis in the left inferior pubic ramus.  Given these findings, Dr. Gatha recommended PSMA PET.  PET on 04/25/2024 demonstrated widespread PSMA avid osseous metastases involving the sacrum, bilateral posterior iliac bones, right inferior  pubic ramus, L1 vertebral body, and left T6 transverse process; no PSMA avid metastatic lymph nodes were appreciated; and stable small pulmonary nodules,  also without PSMA activity.  Patient was seen by Dr. Sherrod on 05/09/2024.  At that time he noted pain in his thigh and buttock, which he initially thought might be sciatica. Per Dr. Jeannett recommendations, the patient decided to restart Eligard and docetaxel chemotherapy.  He was kindly referred to us  today to discuss palliative radiation to symptomatic bone lesions.  Today, the patient notes right sided leg pain that starts in his lower buttock and radiates down the back of this right leg to his foot. He states this pain is worse with prolonged sitting and improves with activity. He is taking Tylenol /Ibuprofen  q4h and a muscle relaxer at night for this pain. He has been working with PT and is meeting with an orthopedic specialist for this issue. He also reports chronic, lower right sided back pain, that has been present for approximately 10 years and has remained unchanged. He denies pain elsewhere.    PREVIOUS RADIATION THERAPY: No   PAST MEDICAL HISTORY:  Past Medical History:  Diagnosis Date   Allergy    Arthritis    both knees    BCC (basal cell carcinoma), arm, right 09/2019   MOHS ([place])   BCC (basal cell carcinoma), face 2014   L preauricular s/p MOHS   BCC (basal cell carcinoma), face 02/2018   L upper lip   BPH (benign prostatic hypertrophy)    Dr. Nieves @ [facility]   Carotid stenosis 05/2015   RICA 40-59%, LICA  1-39%, L vertebral occlusion, rpt 1 yr   Cataract    removed both eyes    Diabetes mellitus without complication (HCC)    FHx: colon cancer    FHx: prostate cancer    Heart murmur    mild aortic stenosis    History of kidney stones    Hyperlipidemia    borderline- on rosuvastatin  now normal    Lung cancer (HCC) 07/2022   Myocardial infarction (HCC) 09/07/2022   Occlusion of right vertebral artery  05/2015   Prostate cancer (HCC) 07/2022       PAST SURGICAL HISTORY: Past Surgical History:  Procedure Laterality Date   BASAL CELL CARCINOMA EXCISION  05/2018   lip, 2020 x3  basal cells removed    CATARACT EXTRACTION  2008   Left   CATARACT EXTRACTION  2013   Right ([place])   COLONOSCOPY  ~2010   medium int hemorrhoids, o/w WNL, rpt 5 yrs given fmhx (Dr. Celestia)   COLONOSCOPY  08/2019   TAs, diverticulosis, rpt 3 yrs ([place])   COLONOSCOPY  12/2023   2TAs, diverticulosis, int hem, no f/u needed ([place])   CORONARY/GRAFT ACUTE MI REVASCULARIZATION N/A 09/07/2022   Procedure: Coronary/Graft Acute MI Revascularization;  Surgeon: Thukkani, Arun K, MD;  Location: [facility];  Service: Cardiovascular;  Laterality: N/A;   ENDARTERECTOMY Right 12/01/2019   Procedure: RIGHT ENDARTERECTOMY CAROTID;  Surgeon: Gretta Lonni PARAS, MD;  Location: [facility] OR;  Service: Vascular;  Laterality: Right;   exercise treadmill  11/2005   WNL ([place])   FINE NEEDLE ASPIRATION  07/28/2022   Procedure: FINE NEEDLE ASPIRATION (FNA) LINEAR;  Surgeon: Brenna Adine CROME, DO;  Location: [facility];  Service: Pulmonary;;   HERNIA REPAIR  2008   Right   IR IMAGING GUIDED PORT INSERTION  08/11/2022   IR IMAGING GUIDED PORT INSERTION  10/08/2022   IR REMOVAL TUN ACCESS W/ PORT W/O FL MOD SED  09/10/2022   LEFT HEART CATH AND CORONARY ANGIOGRAPHY N/A 09/07/2022   Procedure: LEFT HEART CATH AND CORONARY ANGIOGRAPHY;  Surgeon: Wendel Lurena POUR, MD;  Location: [facility];  Service: Cardiovascular;  Laterality: N/A;   MOHS SURGERY Left spring 2014   basal cell face   PATCH ANGIOPLASTY Right 12/01/2019   Procedure: PATCH ANGIOPLASTY USING XENOSURE BIOLOGIC PATCH;  Surgeon: Gretta Lonni PARAS, MD;  Location[facility];  Service: Vascular;  Laterality: Right;   Testicular Biopsy  2003   benign, varicocele   US  ECHOCARDIOGRAPHY  11/2005   aortic sclerosis, EF 55-60%, diastolic dysfunction    VIDEO BRONCHOSCOPY WITH ENDOBRONCHIAL ULTRASOUND Bilateral 07/28/2022   Procedure: VIDEO BRONCHOSCOPY WITH ENDOBRONCHIAL ULTRASOUND;  Surgeon: Brenna Adine CROME, DO;  Location: [facility];  Service: Pulmonary;  Laterality: Bilateral;     FAMILY HISTORY:  Family History  Problem Relation Age of Onset   Cancer Father 32       prostate   Coronary artery disease Father 87       MI   Prostate cancer Father    Cancer Paternal Grandfather 21       prostate   Prostate cancer Paternal Grandfather    Stroke Mother    Hypertension Mother    Cancer Mother 59       colon   Colon cancer Mother    Coronary artery disease Maternal Uncle    Cancer Sister 98       breast   Breast cancer Sister    Colon cancer Maternal Grandmother  Diabetes Neg Hx    Colon polyps Neg Hx    Esophageal cancer Neg Hx    Rectal cancer Neg Hx    Stomach cancer Neg Hx      SOCIAL HISTORY:  reports that he has never smoked. He has never used smokeless tobacco. He reports that he does not currently use alcohol . He reports that he does not use drugs.   ALLERGIES: Patient has no known allergies.   MEDICATIONS:  Current Outpatient Medications  Medication Sig Dispense Refill   potassium chloride  (KLOR-CON ) 10 MEQ tablet Take 1 tablet (10 mEq total) by mouth as needed. 90 tablet 3   abiraterone acetate (ZYTIGA) 250 MG tablet Take 1,000 mg by mouth daily.     Accu-Chek Softclix Lancets lancets SMARTSIG:Topical     acetaminophen  (TYLENOL ) 500 MG tablet Take 1,000 mg by mouth every 6 (six) hours as needed for mild pain or headache.     aspirin  EC 81 MG tablet Take 1 tablet (81 mg total) by mouth daily. Swallow whole.     Blood Glucose Monitoring Suppl DEVI 1 each by Does not apply route in the morning, at noon, and at bedtime. E11.69. May substitute to any manufacturer covered by patient's insurance. 1 each 0   Calcium  Carbonate-Vit D-Min (CALCIUM  600+D PLUS MINERALS) 600-400 MG-UNIT TABS Take 600 mg by mouth daily.      Cholecalciferol (VITAMIN D3) 25 MCG (1000 UT) CAPS Take 1 capsule (1,000 Units total) by mouth daily.     clopidogrel  (PLAVIX ) 75 MG tablet TAKE 1 TABLET BY MOUTH EVERY DAY 90 tablet 2   dexamethasone  (DECADRON ) 4 MG tablet Take 2 tabs by mouth 2 times daily starting day before chemo. Then take 2 tabs daily for 2 days starting day after chemo. Take with food. 30 tablet 1   fluticasone  (FLONASE ) 50 MCG/ACT nasal spray Place 1 spray into both nostrils daily as needed for allergies or rhinitis.     HYDROcodone -acetaminophen  (NORCO/VICODIN) 5-325 MG tablet Take 1 tablet by mouth every 6 (six) hours.     Lancets Misc. (ACCU-CHEK SOFTCLIX LANCET DEV) KIT USE TO CHECK BLOOD SUGAR IN THE MORNING, AT NOON, AND AT BEDTIME     lidocaine -prilocaine  (EMLA ) cream Apply to affected area once 30 g 3   Multiple Vitamin (MULTIVITAMIN) tablet Take 1 tablet by mouth daily with breakfast.     omeprazole  (PRILOSEC) 20 MG capsule Take 20 mg by mouth as needed.     ondansetron  (ZOFRAN ) 8 MG tablet Take 1 tablet (8 mg total) by mouth every 8 (eight) hours as needed for nausea or vomiting. 30 tablet 1   predniSONE  (DELTASONE ) 5 MG tablet Take 5 mg by mouth daily.     predniSONE  (DELTASONE ) 5 MG tablet Take 1 tablet (5 mg total) by mouth in the morning and at bedtime. 60 tablet 11   prochlorperazine  (COMPAZINE ) 10 MG tablet Take 1 tablet (10 mg total) by mouth every 6 (six) hours as needed for nausea or vomiting. 30 tablet 1   rosuvastatin  (CRESTOR ) 5 MG tablet TAKE 1 TABLET (5 MG TOTAL) BY MOUTH DAILY. 90 tablet 1   tamsulosin  (FLOMAX ) 0.4 MG CAPS capsule Take 0.4 mg by mouth 2 (two) times daily.     No current facility-administered medications for this encounter.     REVIEW OF SYSTEMS: Notable for that above.      PHYSICAL EXAM:  Wt Readings from Last 3 Encounters:  05/12/24 193 lb 2 oz (87.6 kg)  05/09/24 194 lb (88  kg)  04/18/24 189 lb (85.7 kg)   Temp Readings from Last 3 Encounters:  05/12/24 (!) 97.5  F (36.4 C) (Temporal)  05/09/24 97.8 F (36.6 C) (Temporal)  04/18/24 (!) 97.2 F (36.2 C) (Temporal)   BP Readings from Last 3 Encounters:  05/12/24 129/73  05/09/24 126/64  04/18/24 136/64   Pulse Readings from Last 3 Encounters:  05/12/24 71  05/09/24 76  04/18/24 68   Pain Assessment Pain Score: 0-No pain/10  In general this is a well appearing male in no acute distress. He's alert and oriented x4 and appropriate throughout the examination. Cardiopulmonary assessment is negative for acute distress and he exhibits normal effort.   No tenderness to palpation along the spine or lower back.    ECOG = 1  0 - Asymptomatic (Fully active, able to carry on all predisease activities without restriction)  1 - Symptomatic but completely ambulatory (Restricted in physically strenuous activity but ambulatory and able to carry out work of a light or sedentary nature. For example, light housework, office work)  2 - Symptomatic, <50% in bed during the day (Ambulatory and capable of all self care but unable to carry out any work activities. Up and about more than 50% of waking hours)  3 - Symptomatic, >50% in bed, but not bedbound (Capable of only limited self-care, confined to bed or chair 50% or more of waking hours)  4 - Bedbound (Completely disabled. Cannot carry on any self-care. Totally confined to bed or chair)  5 - Death   Raylene MM, Creech RH, Tormey DC, et al. [phone]). Toxicity and response criteria of the [facility]. Am. DOROTHA Bridges. Oncol. 5 (6): 649-55    LABORATORY DATA:  Lab Results  Component Value Date   WBC 7.8 05/09/2024   HGB 12.1 (L) 05/09/2024   HCT 34.5 (L) 05/09/2024   MCV 100.0 05/09/2024   PLT 154 05/09/2024   Lab Results  Component Value Date   NA 138 05/09/2024   K 4.3 05/09/2024   CL 107 05/09/2024   CO2 25 05/09/2024   Lab Results  Component Value Date   ALT 21 05/09/2024   AST 27 05/09/2024   ALKPHOS 426 (H)  05/09/2024   BILITOT 0.5 05/09/2024      RADIOGRAPHY: NM PET (PSMA) SKULL TO MID THIGH Result Date: 04/28/2024 EXAM: PROSTATE PET SKULL BASE TO MID THIGHS 04/25/2024 06:03:35 PM TECHNIQUE: RADIOPHARMACEUTICAL: 8.54 mCi F-18 piflufolastat (Pylarify) injected intravenously. PET imaging was obtained from skull vertex to mid thighs. Computed tomography was used for attenuation correction and localization. Fusion imaging was obtained. Uptake time  mins. COMPARISON: CT 04/11/2024. CLINICAL HISTORY: Prostate cancer, metastatic, assess treatment response; Lung cancer and prostate cancer, further evaluate the abnormal findings on recent CT. 8.54 mCi F18 PSMA IV RAC @ 1641 / HJ; PSMA FOR PROSTATE CANCER ; PSA = 2.49 ng/ml 01/14/2024 ; Hx of lung cancer 2024 ; EOV FINDINGS: PROSTATE AND PROSTATE BED: No significant PSMA activity within the prostate gland. LYMPH NODES: No PSMA avid abdominal or pelvic lymph nodes. BONES: There is intense PSMA activity within the near entirety of the sacrum with SUV max equal 18.3, greater activity within the central and right aspect of the sacrum which corresponds to sclerosis findings on the CT portion of the exam. Intense activity in the posterior left and right iliac bones. Focal activity within the L1 body with SUV max equal 22.9. Sclerotic lesion evident on image 119. Intense activity in the left transverse process  of the T6 vertebral body. Additionally, intense activity in the near entirety of the right inferior pubic ramus. OTHER PET FINDINGS: Multiple small pulmonary nodules not changed from recent CT exam. These pulmonary nodules do not have PSMA activity. Small left pleural effusion. Port in the anterior chest wall with tip in distal SVC. Physiologic activity within the salivary glands, liver, spleen, kidneys, bowel, and urinary bladder. IMPRESSION: 1. Widespread PSMA-avid osseous metastases involving the sacrum, bilateral posterior iliac bones, right inferior pubic ramus, L1  vertebral body, and left T6 transverse process, with corresponding sclerotic lesions on CT. 2. No psma avid metastatic lymph nodes 3. Stable small pulmonary nodules without PSMA activity. 4. Small left pleural effusion. Electronically signed by: Norleen Boxer MD 04/28/2024 09:53 AM EDT RP Workstation: [id number]       IMPRESSION/PLAN: 1. Painful bony metastases from stage IV adenocarcinoma of the prostate  We reviewed this patient's current workup. He presents today with widespread PSMA-avid osseous metastases. He unfortunately is experiencing pain from the right pelvic metastasis. He remains asymptomatic from all other lesions. Patient plans to begin palliative chemotherapy with his first infusion scheduled on 05/29/2024.   Dr. Dewey recommends a palliative course (15 fractions) of radiation to the right pelvis to reduce the patient's pain.   Today, we talked to the patient and family about the findings and work-up thus far.  We discussed the natural history of bony metastases and general treatment, highlighting the role of radiotherapy in the management.  We discussed the available radiation techniques, and focused on the details of logistics and delivery.  We reviewed the anticipated acute and late sequelae associated with radiation in this setting.  The patient was encouraged to ask questions that I answered to the best of my ability. A patient consent form was discussed and signed.  We retained a copy for our records.  The patient would like to proceed with radiation and will be scheduled for CT simulation.    In a visit lasting 60 minutes, greater than 50% of the time was spent face to face discussing the patient's condition, in preparation for the discussion, and coordinating the patient's care.   The above documentation reflects my direct findings during this shared patient visit. Please see the separate note by Dr. Dewey on this date for the remainder of the patient's plan of  care.    Leeroy Due, PA-C    **Disclaimer: This note was dictated with voice recognition software. Similar sounding words can inadvertently be transcribed and this note may contain transcription errors which may not have been corrected upon publication of note.**

## 2024-05-17 ENCOUNTER — Telehealth: Payer: Self-pay | Admitting: Radiation Oncology

## 2024-05-17 NOTE — Telephone Encounter (Signed)
 I called and spoke with the patient to let him know that his health insurance would not allow us  to give 15 fractions of radiation which we were planning to do based on the concurrent nature of radiation at the same time as chemotherapy. Dr. Sherrod has approved delaying his chemotherapy so he can complete radiation in the 10 fractions allowed by [facility]. He is dissappointed but in agreement with this decision and will come on 11/24 for simulation and start treatment on 06/01/24.

## 2024-05-18 NOTE — Progress Notes (Signed)
 Pharmacist Chemotherapy Monitoring - Initial Assessment    Anticipated start date: 05/29/24   The following has been reviewed per standard work regarding the patient's treatment regimen: The patient's diagnosis, treatment plan and drug doses, and organ/hematologic function Lab orders and baseline tests specific to treatment regimen  The treatment plan start date, drug sequencing, and pre-medications Prior authorization status  Patient's documented medication list, including drug-drug interaction screen and prescriptions for anti-emetics and supportive care specific to the treatment regimen The drug concentrations, fluid compatibility, administration routes, and timing of the medications to be used The patient's access for treatment and lifetime cumulative dose history, if applicable  The patient's medication allergies and previous infusion related reactions, if applicable   Changes made to treatment plan:  N/A  Follow up needed:  N/A   Xzayvion Vaeth, PharmD, MBA

## 2024-05-19 ENCOUNTER — Encounter: Payer: Self-pay | Admitting: Internal Medicine

## 2024-05-22 ENCOUNTER — Ambulatory Visit
Admission: RE | Admit: 2024-05-22 | Discharge: 2024-05-22 | Disposition: A | Discharge status: Home / Self Care | Source: Ambulatory Visit | Attending: Radiation Oncology | Admitting: Radiation Oncology

## 2024-05-22 DIAGNOSIS — C61 Malignant neoplasm of prostate: Secondary | ICD-10-CM | POA: Insufficient documentation

## 2024-05-29 ENCOUNTER — Inpatient Hospital Stay: Attending: Internal Medicine

## 2024-05-29 ENCOUNTER — Inpatient Hospital Stay

## 2024-05-29 ENCOUNTER — Ambulatory Visit
Admission: RE | Admit: 2024-05-29 | Discharge: 2024-05-29 | Disposition: A | Discharge status: Home / Self Care | Source: Ambulatory Visit | Attending: Radiation Oncology | Admitting: Radiation Oncology

## 2024-05-29 ENCOUNTER — Other Ambulatory Visit: Payer: Self-pay | Admitting: Physician Assistant

## 2024-05-29 ENCOUNTER — Ambulatory Visit: Admitting: Radiation Oncology

## 2024-05-29 ENCOUNTER — Encounter: Payer: Self-pay | Admitting: Internal Medicine

## 2024-05-29 ENCOUNTER — Inpatient Hospital Stay: Admitting: Internal Medicine

## 2024-05-29 VITALS — BP 122/68 | HR 75 | Temp 97.6°F | Resp 17 | Ht 68.0 in | Wt 196.0 lb

## 2024-05-29 DIAGNOSIS — C61 Malignant neoplasm of prostate: Secondary | ICD-10-CM | POA: Insufficient documentation

## 2024-05-29 DIAGNOSIS — Z9221 Personal history of antineoplastic chemotherapy: Secondary | ICD-10-CM | POA: Insufficient documentation

## 2024-05-29 DIAGNOSIS — C3490 Malignant neoplasm of unspecified part of unspecified bronchus or lung: Secondary | ICD-10-CM | POA: Diagnosis not present

## 2024-05-29 DIAGNOSIS — C7A1 Malignant poorly differentiated neuroendocrine tumors: Secondary | ICD-10-CM | POA: Insufficient documentation

## 2024-05-29 DIAGNOSIS — Z79899 Other long term (current) drug therapy: Secondary | ICD-10-CM | POA: Insufficient documentation

## 2024-05-29 DIAGNOSIS — C7B8 Other secondary neuroendocrine tumors: Secondary | ICD-10-CM | POA: Insufficient documentation

## 2024-05-29 DIAGNOSIS — Z923 Personal history of irradiation: Secondary | ICD-10-CM | POA: Insufficient documentation

## 2024-05-29 LAB — CMP (CANCER CENTER ONLY)
ALT: 27 U/L (ref 0–44)
AST: 66 U/L — ABNORMAL HIGH (ref 15–41)
Albumin: 4 g/dL (ref 3.5–5.0)
Alkaline Phosphatase: 586 U/L — ABNORMAL HIGH (ref 38–126)
Anion gap: 11 (ref 5–15)
BUN: 27 mg/dL — ABNORMAL HIGH (ref 8–23)
CO2: 19 mmol/L — ABNORMAL LOW (ref 22–32)
Calcium: 9.2 mg/dL (ref 8.9–10.3)
Chloride: 102 mmol/L (ref 98–111)
Creatinine: 0.93 mg/dL (ref 0.61–1.24)
GFR, Estimated: >60 mL/min (ref >60)
Glucose, Bld: 212 mg/dL — ABNORMAL HIGH (ref 70–99)
Potassium: 4.3 mmol/L (ref 3.5–5.1)
Sodium: 133 mmol/L — ABNORMAL LOW (ref 135–145)
Total Bilirubin: 0.6 mg/dL (ref 0.0–1.2)
Total Protein: 6.9 g/dL (ref 6.5–8.1)

## 2024-05-29 LAB — CBC WITH DIFFERENTIAL (CANCER CENTER ONLY)
Abs Immature Granulocytes: 0.06 K/uL (ref 0.00–0.07)
Basophils Absolute: 0 K/uL (ref 0.0–0.1)
Basophils Relative: 0 %
Eosinophils Absolute: 0 K/uL (ref 0.0–0.5)
Eosinophils Relative: 0 %
HCT: 34.3 % — ABNORMAL LOW (ref 39.0–52.0)
Hemoglobin: 12.1 g/dL — ABNORMAL LOW (ref 13.0–17.0)
Immature Granulocytes: 1 %
Lymphocytes Relative: 7 %
Lymphs Abs: 0.9 K/uL (ref 0.7–4.0)
MCH: 34.8 pg — ABNORMAL HIGH (ref 26.0–34.0)
MCHC: 35.3 g/dL (ref 30.0–36.0)
MCV: 98.6 fL (ref 80.0–100.0)
Monocytes Absolute: 0.6 K/uL (ref 0.1–1.0)
Monocytes Relative: 5 %
Neutro Abs: 11.4 K/uL — ABNORMAL HIGH (ref 1.7–7.7)
Neutrophils Relative %: 87 %
Platelet Count: 168 K/uL (ref 150–400)
RBC: 3.48 MIL/uL — ABNORMAL LOW (ref 4.22–5.81)
RDW: 13.1 % (ref 11.5–15.5)
WBC Count: 12.9 K/uL — ABNORMAL HIGH (ref 4.0–10.5)
nRBC: 0 % (ref 0.0–0.2)

## 2024-05-29 NOTE — Progress Notes (Signed)
 [facility] [phone]   [phone]  OFFICE PROGRESS NOTE  [name], MD [address] [place] [address]  DIAGNOSIS:  1) Metastatic poorly differentiated carcinoma, neuroendocrine carcinoma of suspicious prostate primary versus primary lung cancer presented with innumerable and bilateral pulmonary nodules in addition to right hilar and mediastinal lymphadenopathy and metastatic disease to several areas of the bone in addition to hypermetabolic activity in the prostate gland diagnosed in January 2024  2) prostate adenocarcinoma diagnosed in February 2024 currently managed by Dr. Nieves   PRIOR THERAPY:  1) Palliative systemic chemotherapy with carboplatin  for AUC of 5 and Imfinzi  1500 Mg IV on day 1 as well as etoposide  100 Mg/M2 on days 1, 2 and 3 with Neulasta  support on day 5. Status post 7 cycles. First dose on 08/11/22.  Starting from cycle #2 his carboplatin  will be reduced to AUC of 4 and 2 etoposide  80 Mg/M2 secondary to intolerance and cycle #1.  Starting from cycle #5 he will be on maintenance treatment with single agent Imfinzi  1500 Mg IV every 4 weeks. 2) palliative radiotherapy to the metastatic disease at the L1 vertebral body as well as the posterior iliac bones.  CURRENT THERAPY: Hormonal therapy with Eligard 30 mg intramuscular every 4 months in addition to Zytiga orally and docetaxel 75 mg/M2 every 3 weeks as well as prednisone  5 mg p.o. twice daily.  First dose June 26, 2024.  INTERVAL HISTORY: Larry Kent 78 y.o. male returns to the clinic today for follow-up visit.Discussed the use of AI scribe software for clinical note transcription with the patient, who gave verbal consent to proceed.  History of Present Illness Larry Kent is a 78 year old male with metastatic poorly differentiated neuroendocrine carcinoma and prostate adenocarcinoma who presents for evaluation before starting ADT and docetaxel  treatment.  He has a history of metastatic poorly differentiated carcinoma, neuroendocrine carcinoma of suspicious prostate primary, and prostate adenocarcinoma diagnosed in January 2024. He underwent systemic chemotherapy with carboplatin , autobucer, and Imfinzi  every three weeks for four cycles. Starting cycle number five, he received consolidation and maintenance treatment with durvalumab  every four weeks for three more cycles before it was discontinued. His treatment was delayed due to palliative radiotherapy to metastatic bone lesions.  Radiation treatment has not yet started due to scheduling issues and insurance limitations, reducing the number of treatments from fifteen to ten. He recently began his first radiation treatment and will continue for the next two weeks. The radiation targets a lesion in his back, causing problems with his sciatic nerve, primarily on the right side. He manages the pain with Tylenol , ice, and heat.  He experiences shortness of breath and chest pain upon exertion, such as walking from the car to a store. He has a history of a heart attack, stents, and a leaky heart valve. He has contacted his cardiologist to address these symptoms.  He is currently taking Zytiga and is awaiting confirmation of his last Eligard shot.     MEDICAL HISTORY: Past Medical History:  Diagnosis Date   Allergy    Arthritis    both knees    BCC (basal cell carcinoma), arm, right 09/2019   MOHS ([place])   BCC (basal cell carcinoma), face 2014   L preauricular s/p MOHS   BCC (basal cell carcinoma), face 02/2018   L upper lip   BPH (benign prostatic hypertrophy)    Dr. Nieves @ [facility]   Carotid  stenosis 05/2015   RICA 40-59%, LICA 1-39%, L vertebral occlusion, rpt 1 yr   Cataract    removed both eyes    Diabetes mellitus without complication (HCC)    FHx: colon cancer    FHx: prostate cancer    Heart murmur    mild aortic stenosis    History of kidney stones     Hyperlipidemia    borderline- on rosuvastatin  now normal    Lung cancer (HCC) 07/2022   Myocardial infarction (HCC) 09/07/2022   Occlusion of right vertebral artery 05/2015   Prostate cancer (HCC) 07/2022    ALLERGIES:  has no known allergies.  MEDICATIONS:  Current Outpatient Medications  Medication Sig Dispense Refill   abiraterone acetate (ZYTIGA) 250 MG tablet Take 1,000 mg by mouth daily.     Accu-Chek Softclix Lancets lancets SMARTSIG:Topical     acetaminophen  (TYLENOL ) 500 MG tablet Take 1,000 mg by mouth every 6 (six) hours as needed for mild pain or headache.     aspirin  EC 81 MG tablet Take 1 tablet (81 mg total) by mouth daily. Swallow whole.     Blood Glucose Monitoring Suppl DEVI 1 each by Does not apply route in the morning, at noon, and at bedtime. E11.69. May substitute to any manufacturer covered by patient's insurance. 1 each 0   Calcium  Carbonate-Vit D-Min (CALCIUM  600+D PLUS MINERALS) 600-400 MG-UNIT TABS Take 600 mg by mouth daily.     Cholecalciferol (VITAMIN D3) 25 MCG (1000 UT) CAPS Take 1 capsule (1,000 Units total) by mouth daily.     clopidogrel  (PLAVIX ) 75 MG tablet TAKE 1 TABLET BY MOUTH EVERY DAY 90 tablet 2   dexamethasone  (DECADRON ) 4 MG tablet Take 2 tabs by mouth 2 times daily starting day before chemo. Then take 2 tabs daily for 2 days starting day after chemo. Take with food. 30 tablet 1   fluticasone  (FLONASE ) 50 MCG/ACT nasal spray Place 1 spray into both nostrils daily as needed for allergies or rhinitis.     Lancets Misc. (ACCU-CHEK SOFTCLIX LANCET DEV) KIT USE TO CHECK BLOOD SUGAR IN THE MORNING, AT NOON, AND AT BEDTIME     lidocaine -prilocaine  (EMLA ) cream Apply to affected area once 30 g 3   Multiple Vitamin (MULTIVITAMIN) tablet Take 1 tablet by mouth daily with breakfast.     omeprazole  (PRILOSEC) 20 MG capsule Take 20 mg by mouth as needed.     ondansetron  (ZOFRAN ) 8 MG tablet Take 1 tablet (8 mg total) by mouth every 8 (eight) hours as needed  for nausea or vomiting. 30 tablet 1   potassium chloride  (KLOR-CON ) 10 MEQ tablet Take 1 tablet (10 mEq total) by mouth as needed. 90 tablet 3   predniSONE  (DELTASONE ) 5 MG tablet Take 5 mg by mouth daily.     predniSONE  (DELTASONE ) 5 MG tablet Take 1 tablet (5 mg total) by mouth in the morning and at bedtime. 60 tablet 11   prochlorperazine  (COMPAZINE ) 10 MG tablet Take 1 tablet (10 mg total) by mouth every 6 (six) hours as needed for nausea or vomiting. 30 tablet 1   rosuvastatin  (CRESTOR ) 5 MG tablet TAKE 1 TABLET (5 MG TOTAL) BY MOUTH DAILY. 90 tablet 1   tamsulosin  (FLOMAX ) 0.4 MG CAPS capsule Take 0.4 mg by mouth 2 (two) times daily.     No current facility-administered medications for this visit.    SURGICAL HISTORY:  Past Surgical History:  Procedure Laterality Date   BASAL CELL CARCINOMA EXCISION  05/2018  lip, 2020 x3  basal cells removed    CATARACT EXTRACTION  2008   Left   CATARACT EXTRACTION  2013   Right ([place])   COLONOSCOPY  ~2010   medium int hemorrhoids, o/w WNL, rpt 5 yrs given fmhx (Dr. Celestia)   COLONOSCOPY  08/2019   TAs, diverticulosis, rpt 3 yrs ([place])   COLONOSCOPY  12/2023   2TAs, diverticulosis, int hem, no f/u needed ([place])   CORONARY/GRAFT ACUTE MI REVASCULARIZATION N/A 09/07/2022   Procedure: Coronary/Graft Acute MI Revascularization;  Surgeon: Wendel Lurena POUR, MD;  Location: [facility];  Service: Cardiovascular;  Laterality: N/A;   ENDARTERECTOMY Right 12/01/2019   Procedure: RIGHT ENDARTERECTOMY CAROTID;  Surgeon: Gretta Lonni PARAS, MD;  Location: [facility] OR;  Service: Vascular;  Laterality: Right;   exercise treadmill  11/2005   WNL ([place])   FINE NEEDLE ASPIRATION  07/28/2022   Procedure: FINE NEEDLE ASPIRATION (FNA) LINEAR;  Surgeon: Brenna Adine CROME, DO;  Location: [facility]OS[facility];  Service: Pulmonary;;   HERNIA REPAIR  2008   Right   IR IMAGING GUIDED PORT INSERTION  08/11/2022   IR IMAGING GUIDED PORT INSERTION   10/08/2022   IR REMOVAL TUN ACCESS W/ PORT W/O FL MOD SED  09/10/2022   LEFT HEART CATH AND CORONARY ANGIOGRAPHY N/A 09/07/2022   Procedure: LEFT HEART CATH AND CORONARY ANGIOGRAPHY;  Surgeon: Wendel Lurena POUR, MD;  Location: [facility];  Service: Cardiovascular;  Laterality: N/A;   MOHS SURGERY Left spring 2014   basal cell face   PATCH ANGIOPLASTY Right 12/01/2019   Procedure: PATCH ANGIOPLASTY USING XENOSURE BIOLOGIC PATCH;  Surgeon: Gretta Lonni PARAS, MD;  Location: [facility] OR;  Service: Vascular;  Laterality: Right;   Testicular Biopsy  2003   benign, varicocele   US  ECHOCARDIOGRAPHY  11/2005   aortic sclerosis, EF 55-60%, diastolic dysfunction   VIDEO BRONCHOSCOPY WITH ENDOBRONCHIAL ULTRASOUND Bilateral 07/28/2022   Procedure: VIDEO BRONCHOSCOPY WITH ENDOBRONCHIAL ULTRASOUND;  Surgeon: Brenna Adine CROME, DO;  Location: [facility]OS[facility];  Service: Pulmonary;  Laterality: Bilateral;    REVIEW OF SYSTEMS:  Constitutional: positive for fatigue Eyes: negative Ears, nose, mouth, throat, and face: negative Respiratory: positive for dyspnea on exertion Cardiovascular: negative Gastrointestinal: negative Genitourinary:negative Integument/breast: negative Hematologic/lymphatic: negative Musculoskeletal:positive for bone pain Neurological: negative Behavioral/Psych: negative Endocrine: negative Allergic/Immunologic: negative   PHYSICAL EXAMINATION: General appearance: alert, cooperative, fatigued, and no distress Head: Normocephalic, without obvious abnormality, atraumatic Neck: no adenopathy, no JVD, supple, symmetrical, trachea midline, and thyroid  not enlarged, symmetric, no tenderness/mass/nodules Lymph nodes: Cervical, supraclavicular, and axillary nodes normal. Resp: clear to auscultation bilaterally Back: symmetric, no curvature. ROM normal. No CVA tenderness. Cardio: regular rate and rhythm, S1, S2 normal, no murmur, click, rub or gallop GI: soft, non-tender; bowel sounds  normal; no masses,  no organomegaly Extremities: extremities normal, atraumatic, no cyanosis or edema Neurologic: Alert and oriented X 3, normal strength and tone. Normal symmetric reflexes. Normal coordination and gait  ECOG PERFORMANCE STATUS: 1 - Symptomatic but completely ambulatory  Blood pressure 122/68, pulse 75, temperature 97.6 F (36.4 C), temperature source Temporal, resp. rate 17, height 5' 8 (1.727 m), weight 196 lb (88.9 kg), SpO2 98%.  LABORATORY DATA: Lab Results  Component Value Date   WBC 7.8 05/09/2024   HGB 12.1 (L) 05/09/2024   HCT 34.5 (L) 05/09/2024   MCV 100.0 05/09/2024   PLT 154 05/09/2024      Chemistry      Component Value Date/Time   NA 138 05/09/2024 1338  NA 140 05/31/2023 1417   K 4.3 05/09/2024 1338   CL 107 05/09/2024 1338   CO2 25 05/09/2024 1338   BUN 20 05/09/2024 1338   BUN 14 05/31/2023 1417   CREATININE 0.84 05/09/2024 1338      Component Value Date/Time   CALCIUM  9.4 05/09/2024 1338   ALKPHOS 426 (H) 05/09/2024 1338   AST 27 05/09/2024 1338   ALT 21 05/09/2024 1338   BILITOT 0.5 05/09/2024 1338       RADIOGRAPHIC STUDIES: No results found.   ASSESSMENT AND PLAN: This is a very pleasant 78 years old white male with: 1) Metastatic poorly differentiated carcinoma, neuroendocrine carcinoma of suspicious prostate primary versus primary lung cancer presented with innumerable and bilateral pulmonary nodules in addition to right hilar and mediastinal lymphadenopathy and metastatic disease to several areas of the bone in addition to hypermetabolic activity in the prostate gland diagnosed in January 2024  The patient is currently undergoing systemic chemotherapy with carboplatin  for AUC of 5 on day 1, Imfinzi  1500 Mg IV on day 1, etoposide  100 Mg/M2 on days 1, 2 and 3 with Neulasta  support every 3 weeks status post 7 cycles.  Starting from cycle #5 the patient will be on maintenance treatment with Imfinzi  1500 Mg IV every 4 weeks.  His  treatment was discontinued secondary to liver dysfunction. The patient is currently on observation and he is feeling fine with no concerning complaints.  He had repeat CT scan of the chest, abdomen and pelvis performed recently. His scan showed no concerning findings for disease progression. He had PMS a PET scan performed recently and it showed widespread PSMA avid osseous metastasis involving the sacrum, bilateral posterior iliac veins, right inferior pubic ramus, L1 vertebral body well as left T6 transverse process but no DMSA avid thoracic lymph nodes and a stable small pulmonary nodules without PSMA avid tumor and small left pleural effusion. Assessment and Plan Assessment & Plan Metastatic neuroendocrine carcinoma of probable prostate origin and prostate adenocarcinoma with right-sided bone metastases and associated pain Metastatic neuroendocrine carcinoma with probable prostate origin and prostate adenocarcinoma diagnosed in January 2024. Currently undergoing palliative radiotherapy for right-sided bone metastases causing sciatic nerve pain. Pain managed with Tylenol , ice, heat, and Salonpas patches. Radiation therapy delayed due to insurance constraints and concurrent chemotherapy. Treatment with ADT, docetaxel, and Zytiga planned, with Eligard injection to be added. Treatment delayed by two weeks due to radiation therapy and potential increased side effects from chemotherapy. - Delayed chemotherapy initiation by two weeks to complete radiation therapy. - Will administer ADT, docetaxel, and Zytiga after radiation completion. - Will coordinate with urologist to ensure Eligard injection is administered. - Will schedule chemotherapy for December 29th, 2024, post-radiation recovery.  Exertional chest pain and dyspnea in the context of history of myocardial infarction with coronary stents and mitral valve regurgitation Exertional chest pain and dyspnea likely related to cardiac history, including  myocardial infarction with coronary stents and mitral valve regurgitation. Symptoms occur with exertion and are not associated with increased heart rate. Differential includes cardiac etiology versus side effects from Zytiga, but exertional nature suggests cardiac origin. - Consult cardiologist regarding exertional chest pain and dyspnea. The patient was advised to call immediately if he has any concerning symptoms in the interval. The patient voices understanding of current disease status and treatment options and is in agreement with the current care plan.  All questions were answered. The patient knows to call the clinic with any problems, questions or concerns. We can  certainly see the patient much sooner if necessary.  The total time spent in the appointment was 30 minutes.  Disclaimer: This note was dictated with voice recognition software. Similar sounding words can inadvertently be transcribed and may not be corrected upon review.

## 2024-05-31 ENCOUNTER — Inpatient Hospital Stay

## 2024-05-31 DIAGNOSIS — C61 Malignant neoplasm of prostate: Secondary | ICD-10-CM | POA: Diagnosis not present

## 2024-06-01 ENCOUNTER — Other Ambulatory Visit: Payer: Self-pay

## 2024-06-01 ENCOUNTER — Other Ambulatory Visit: Payer: Self-pay | Admitting: Radiation Oncology

## 2024-06-01 DIAGNOSIS — C61 Malignant neoplasm of prostate: Secondary | ICD-10-CM | POA: Diagnosis not present

## 2024-06-01 LAB — RAD ONC ARIA SESSION SUMMARY
Course Elapsed Days: 0
Plan Fractions Treated to Date: 1
Plan Prescribed Dose Per Fraction: 3 Gy
Plan Total Fractions Prescribed: 10
Plan Total Prescribed Dose: 30 Gy
Reference Point Dosage Given to Date: 3 Gy
Reference Point Session Dosage Given: 3 Gy
Session Number: 1

## 2024-06-01 MED ORDER — HYDROCODONE-ACETAMINOPHEN 5-325 MG PO TABS: 1.0000 | ORAL_TABLET | Freq: Four times a day (QID) | ORAL | 0 refills | Status: DC | PRN

## 2024-06-02 ENCOUNTER — Other Ambulatory Visit: Payer: Self-pay

## 2024-06-02 ENCOUNTER — Ambulatory Visit: Admission: RE | Admit: 2024-06-02 | Discharge: 2024-06-02 | Attending: Radiation Oncology

## 2024-06-02 ENCOUNTER — Ambulatory Visit
Admission: RE | Admit: 2024-06-02 | Discharge: 2024-06-02 | Disposition: A | Source: Ambulatory Visit | Attending: Radiation Oncology

## 2024-06-02 DIAGNOSIS — C61 Malignant neoplasm of prostate: Secondary | ICD-10-CM | POA: Diagnosis not present

## 2024-06-02 LAB — RAD ONC ARIA SESSION SUMMARY
Course Elapsed Days: 1
Plan Fractions Treated to Date: 2
Plan Prescribed Dose Per Fraction: 3 Gy
Plan Total Fractions Prescribed: 10
Plan Total Prescribed Dose: 30 Gy
Reference Point Dosage Given to Date: 6 Gy
Reference Point Session Dosage Given: 3 Gy
Session Number: 2

## 2024-06-05 ENCOUNTER — Ambulatory Visit
Admission: RE | Admit: 2024-06-05 | Discharge: 2024-06-05 | Disposition: A | Source: Ambulatory Visit | Attending: Radiation Oncology

## 2024-06-05 ENCOUNTER — Other Ambulatory Visit: Payer: Self-pay

## 2024-06-05 DIAGNOSIS — C61 Malignant neoplasm of prostate: Secondary | ICD-10-CM | POA: Diagnosis not present

## 2024-06-05 LAB — RAD ONC ARIA SESSION SUMMARY
Course Elapsed Days: 4
Plan Fractions Treated to Date: 3
Plan Prescribed Dose Per Fraction: 3 Gy
Plan Total Fractions Prescribed: 10
Plan Total Prescribed Dose: 30 Gy
Reference Point Dosage Given to Date: 9 Gy
Reference Point Session Dosage Given: 3 Gy
Session Number: 3

## 2024-06-06 ENCOUNTER — Other Ambulatory Visit: Payer: Self-pay

## 2024-06-06 ENCOUNTER — Ambulatory Visit
Admission: RE | Admit: 2024-06-06 | Discharge: 2024-06-06 | Disposition: A | Source: Ambulatory Visit | Attending: Radiation Oncology

## 2024-06-06 DIAGNOSIS — C61 Malignant neoplasm of prostate: Secondary | ICD-10-CM | POA: Diagnosis not present

## 2024-06-06 LAB — RAD ONC ARIA SESSION SUMMARY
Course Elapsed Days: 5
Plan Fractions Treated to Date: 4
Plan Prescribed Dose Per Fraction: 3 Gy
Plan Total Fractions Prescribed: 10
Plan Total Prescribed Dose: 30 Gy
Reference Point Dosage Given to Date: 12 Gy
Reference Point Session Dosage Given: 3 Gy
Session Number: 4

## 2024-06-07 ENCOUNTER — Other Ambulatory Visit: Payer: Self-pay

## 2024-06-07 ENCOUNTER — Ambulatory Visit
Admission: RE | Admit: 2024-06-07 | Discharge: 2024-06-07 | Disposition: A | Discharge status: Home / Self Care | Source: Ambulatory Visit | Attending: Radiation Oncology | Admitting: Radiation Oncology

## 2024-06-07 DIAGNOSIS — C61 Malignant neoplasm of prostate: Secondary | ICD-10-CM | POA: Diagnosis not present

## 2024-06-07 LAB — RAD ONC ARIA SESSION SUMMARY
Course Elapsed Days: 6
Plan Fractions Treated to Date: 5
Plan Prescribed Dose Per Fraction: 3 Gy
Plan Total Fractions Prescribed: 10
Plan Total Prescribed Dose: 30 Gy
Reference Point Dosage Given to Date: 15 Gy
Reference Point Session Dosage Given: 3 Gy
Session Number: 5

## 2024-06-08 ENCOUNTER — Other Ambulatory Visit: Payer: Self-pay

## 2024-06-08 ENCOUNTER — Ambulatory Visit
Admission: RE | Admit: 2024-06-08 | Discharge: 2024-06-08 | Disposition: A | Discharge status: Home / Self Care | Source: Ambulatory Visit | Attending: Radiation Oncology | Admitting: Radiation Oncology

## 2024-06-08 DIAGNOSIS — C61 Malignant neoplasm of prostate: Secondary | ICD-10-CM | POA: Diagnosis not present

## 2024-06-08 LAB — RAD ONC ARIA SESSION SUMMARY
Course Elapsed Days: 7
Plan Fractions Treated to Date: 6
Plan Prescribed Dose Per Fraction: 3 Gy
Plan Total Fractions Prescribed: 10
Plan Total Prescribed Dose: 30 Gy
Reference Point Dosage Given to Date: 18 Gy
Reference Point Session Dosage Given: 3 Gy
Session Number: 6

## 2024-06-09 ENCOUNTER — Ambulatory Visit
Admission: RE | Admit: 2024-06-09 | Discharge: 2024-06-09 | Disposition: A | Source: Ambulatory Visit | Attending: Radiation Oncology

## 2024-06-09 ENCOUNTER — Other Ambulatory Visit: Payer: Self-pay

## 2024-06-09 DIAGNOSIS — C61 Malignant neoplasm of prostate: Secondary | ICD-10-CM | POA: Diagnosis not present

## 2024-06-09 LAB — RAD ONC ARIA SESSION SUMMARY
Course Elapsed Days: 8
Plan Fractions Treated to Date: 7
Plan Prescribed Dose Per Fraction: 3 Gy
Plan Total Fractions Prescribed: 10
Plan Total Prescribed Dose: 30 Gy
Reference Point Dosage Given to Date: 21 Gy
Reference Point Session Dosage Given: 3 Gy
Session Number: 7

## 2024-06-12 ENCOUNTER — Ambulatory Visit
Admission: RE | Admit: 2024-06-12 | Discharge: 2024-06-12 | Disposition: A | Source: Ambulatory Visit | Attending: Radiation Oncology

## 2024-06-12 ENCOUNTER — Other Ambulatory Visit: Payer: Self-pay

## 2024-06-12 DIAGNOSIS — C61 Malignant neoplasm of prostate: Secondary | ICD-10-CM | POA: Diagnosis not present

## 2024-06-12 LAB — RAD ONC ARIA SESSION SUMMARY
Course Elapsed Days: 11
Plan Fractions Treated to Date: 8
Plan Prescribed Dose Per Fraction: 3 Gy
Plan Total Fractions Prescribed: 10
Plan Total Prescribed Dose: 30 Gy
Reference Point Dosage Given to Date: 24 Gy
Reference Point Session Dosage Given: 3 Gy
Session Number: 8

## 2024-06-13 ENCOUNTER — Telehealth: Payer: Self-pay

## 2024-06-13 ENCOUNTER — Other Ambulatory Visit: Payer: Self-pay

## 2024-06-13 ENCOUNTER — Ambulatory Visit
Admission: RE | Admit: 2024-06-13 | Discharge: 2024-06-13 | Disposition: A | Source: Ambulatory Visit | Attending: Radiation Oncology

## 2024-06-13 DIAGNOSIS — C61 Malignant neoplasm of prostate: Secondary | ICD-10-CM | POA: Diagnosis not present

## 2024-06-13 LAB — RAD ONC ARIA SESSION SUMMARY
Course Elapsed Days: 12
Plan Fractions Treated to Date: 9
Plan Prescribed Dose Per Fraction: 3 Gy
Plan Total Fractions Prescribed: 10
Plan Total Prescribed Dose: 30 Gy
Reference Point Dosage Given to Date: 27 Gy
Reference Point Session Dosage Given: 3 Gy
Session Number: 9

## 2024-06-13 NOTE — Telephone Encounter (Signed)
 Spoke with patient in regards to medication management of Prednisone , Dexamethasone , Zofran  and Compazine .  Clarified with Dr. Sherrod the usage of these medications.  Informed the patient that Prednisone  is taken every day EXCEPT the days 3 days that he takes Dexamethasone , which is the day before treatment, the day of treatment and the day after treatment.  Informed patient that he can take Zofran  and Compazine  as needed for nausea and vomiting. He voiced understanding.

## 2024-06-14 ENCOUNTER — Other Ambulatory Visit: Payer: Self-pay

## 2024-06-14 ENCOUNTER — Ambulatory Visit: Admission: RE | Admit: 2024-06-14 | Discharge: 2024-06-14 | Attending: Radiation Oncology

## 2024-06-14 DIAGNOSIS — C61 Malignant neoplasm of prostate: Secondary | ICD-10-CM | POA: Diagnosis not present

## 2024-06-14 LAB — RAD ONC ARIA SESSION SUMMARY
Course Elapsed Days: 13
Plan Fractions Treated to Date: 10
Plan Prescribed Dose Per Fraction: 3 Gy
Plan Total Fractions Prescribed: 10
Plan Total Prescribed Dose: 30 Gy
Reference Point Dosage Given to Date: 30 Gy
Reference Point Session Dosage Given: 3 Gy
Session Number: 10

## 2024-06-15 NOTE — Radiation Completion Notes (Addendum)
" °  Radiation Oncology         [phone] ________________________________  Name: Larry Kent MRN: [id number]  Date of Service: 06/14/2024  DOB: 09-Jun-1946  End of Treatment Note  Diagnosis: Stage IV adenocarcinoma of the prostate with painful bone metastases  Intent: Palliative     ==========DELIVERED PLANS==========  First Treatment Date: 2024-06-01 Last Treatment Date: 2024-06-14   Plan Name: Pelvis_Sacrum Site: Sacrum Technique: 3D Mode: Photon Dose Per Fraction: 3 Gy Prescribed Dose (Delivered / Prescribed): 30 Gy / 30 Gy Prescribed Fxs (Delivered / Prescribed): 10 / 10     ==========ON TREATMENT VISIT DATES========== 2024-06-02, 2024-06-09    See weekly On Treatment Notes in Epic for details in the Media tab (listed as Progress notes on the On Treatment Visit Dates listed above). The patient tolerated radiation.   The patient will receive a call in about one month from the radiation oncology department. He will continue follow up with Dr. Sherrod as well.      Donald KYM Husband, PAC     "

## 2024-06-19 ENCOUNTER — Inpatient Hospital Stay

## 2024-06-19 ENCOUNTER — Other Ambulatory Visit: Payer: Self-pay | Admitting: Radiology

## 2024-06-19 ENCOUNTER — Inpatient Hospital Stay: Admitting: Physician Assistant

## 2024-06-19 MED ORDER — HYDROCODONE-ACETAMINOPHEN 5-325 MG PO TABS: 1.0000 | ORAL_TABLET | Freq: Four times a day (QID) | ORAL | 0 refills | Status: DC | PRN

## 2024-06-21 ENCOUNTER — Inpatient Hospital Stay

## 2024-06-21 NOTE — Progress Notes (Signed)
 [facility] OFFICE PROGRESS NOTE  [name], MD [address] [place] [address]  DIAGNOSIS:  1) Metastatic poorly differentiated carcinoma, neuroendocrine carcinoma of suspicious prostate primary versus primary lung cancer presented with innumerable and bilateral pulmonary nodules in addition to right hilar and mediastinal lymphadenopathy and metastatic disease to several areas of the bone in addition to hypermetabolic activity in the prostate gland diagnosed in January 2024  2) prostate adenocarcinoma diagnosed in February 2024 currently managed by Dr. Nieves  PRIOR THERAPY: 1) Palliative systemic chemotherapy with carboplatin  for AUC of 5 and Imfinzi  1500 Mg IV on day 1 as well as etoposide  100 Mg/M2 on days 1, 2 and 3 with Neulasta  support on day 5. Status post 7 cycles. First dose on 08/11/22.  Starting from cycle #2 his carboplatin  will be reduced to AUC of 4 and 2 etoposide  80 Mg/M2 secondary to intolerance and cycle #1.  Starting from cycle #5 he will be on maintenance treatment with single agent Imfinzi  1500 Mg IV every 4 weeks. 2) palliative radiotherapy to the metastatic disease at the L1 vertebral body as well as the posterior iliac bones to the care of Dr. Dewey.  This was completed on June 14, 2024  CURRENT THERAPY: Hormonal therapy with Eligard 30 mg intramuscular every 4 months in addition to Zytiga orally and docetaxel  75 mg/M2 every 3 weeks as well as prednisone  5 mg p.o. twice daily.  First dose June 26, 2024   INTERVAL HISTORY: Larry Kent 78 y.o. male returns to the clinic today for a follow-up visit.  The patient was last seen in the clinic by Dr. Sherrod on 05/29/2024.  The patient has metastatic poorly differentiated neuroendocrine carcinoma and prostate adenocarcinoma.  Dr. Sherrod recommended that he start treatment with hormone therapy with Eligard intramuscular every 4 months in addition to oral Zytiga and IV docetaxel  in  addition to prednisone .  He is expected to start this today. We discussed clarification of the prednisone  and decadron  today.   He also completed palliative radiation to the pelvis under the care of radiation oncology.  He completed this on 06/14/24.    He denies fevers, chills, night sweats, vomiting, diarrhea, or abnormal bleeding. Appetite is decreased but improves with increased food intake; he eats small, frequent meals.  He continues to have persistent lower back pain and stiffness, previously treated with radiation therapy. Pain initially radiated down the right leg from knee to shin and ankle, but is now localized to the lower back and large muscle groups, with ongoing stiffness and difficulty straightening. Movement provides some relief, but no position is fully comfortable at rest. He uses hydrocodone /acetaminophen  (approximately three tablets daily) primarily for pain control and sleep, and supplements with ice, heat, and is open to topical analgesic patches. No new neurological deficits. He has an upcoming appointment with a back specialist and is interested in additional pain management support, including palliative care referral.  He receives Eligard injections for androgen deprivation therapy but is uncertain about the timing of his most recent injection.   He recalls receiving an injection last spring, missing one in the fall, and subsequently receiving it after contacting the office. He is advised to confirm the date of his last injection and ensure future appointments are scheduled every six months.  He experiences exertional dyspnea and tachycardia, particularly with stair climbing or bending, requiring rest to recover. He is scheduled for cardiology evaluation for aortic stenosis and blood pressure management. He continues to use  a recumbent bike for short periods several times daily to maintain mobility and reduce fatigue. He denies chest pain, syncope, or heart failure symptoms. No  rashes, skin changes, or external scarring from radiation therapy. Constipation previously associated with hydrocodone /acetaminophen  use has resolved.  He is here today for evaluation and repeat blood work before undergoing cycle #1.   MEDICAL HISTORY: Past Medical History:  Diagnosis Date   Allergy    Arthritis    both knees    BCC (basal cell carcinoma), arm, right 09/2019   MOHS ([place])   BCC (basal cell carcinoma), face 2014   L preauricular s/p MOHS   BCC (basal cell carcinoma), face 02/2018   L upper lip   BPH (benign prostatic hypertrophy)    Dr. Nieves @ [facility]   Carotid stenosis 05/2015   RICA 40-59%, LICA 1-39%, L vertebral occlusion, rpt 1 yr   Cataract    removed both eyes    Diabetes mellitus without complication (HCC)    FHx: colon cancer    FHx: prostate cancer    Heart murmur    mild aortic stenosis    History of kidney stones    Hyperlipidemia    borderline- on rosuvastatin  now normal    Lung cancer (HCC) 07/2022   Myocardial infarction (HCC) 09/07/2022   Occlusion of right vertebral artery 05/2015   Prostate cancer (HCC) 07/2022    ALLERGIES:  has no known allergies.  MEDICATIONS:  Current Outpatient Medications  Medication Sig Dispense Refill   abiraterone acetate (ZYTIGA) 250 MG tablet Take 1,000 mg by mouth daily.     Accu-Chek Softclix Lancets lancets SMARTSIG:Topical     aspirin  EC 81 MG tablet Take 1 tablet (81 mg total) by mouth daily. Swallow whole.     Blood Glucose Monitoring Suppl DEVI 1 each by Does not apply route in the morning, at noon, and at bedtime. E11.69. May substitute to any manufacturer covered by patient's insurance. 1 each 0   Calcium  Carbonate-Vit D-Min (CALCIUM  600+D PLUS MINERALS) 600-400 MG-UNIT TABS Take 600 mg by mouth daily.     Cholecalciferol (VITAMIN D3) 25 MCG (1000 UT) CAPS Take 1 capsule (1,000 Units total) by mouth daily.     CLINPRO 5000 1.1 % PSTE Place onto teeth.     clopidogrel  (PLAVIX ) 75 MG tablet  TAKE 1 TABLET BY MOUTH EVERY DAY 90 tablet 2   dexamethasone  (DECADRON ) 4 MG tablet Take 2 tabs by mouth 2 times daily starting day before chemo. Then take 2 tabs daily for 2 days starting day after chemo. Take with food. 30 tablet 1   fluorouracil (EFUDEX) 5 % cream Apply topically 2 (two) times daily.     fluticasone  (FLONASE ) 50 MCG/ACT nasal spray Place 1 spray into both nostrils daily as needed for allergies or rhinitis.     HYDROcodone -acetaminophen  (NORCO/VICODIN) 5-325 MG tablet Take 1-2 tablets by mouth every 6 (six) hours as needed for moderate pain (pain score 4-6). 60 tablet 0   Lancets Misc. (ACCU-CHEK SOFTCLIX LANCET DEV) KIT USE TO CHECK BLOOD SUGAR IN THE MORNING, AT NOON, AND AT BEDTIME     lidocaine -prilocaine  (EMLA ) cream Apply to affected area once 30 g 3   Multiple Vitamin (MULTIVITAMIN) tablet Take 1 tablet by mouth daily with breakfast.     omeprazole  (PRILOSEC) 20 MG capsule Take 20 mg by mouth as needed.     ondansetron  (ZOFRAN ) 8 MG tablet Take 1 tablet (8 mg total) by mouth every 8 (eight) hours as needed  for nausea or vomiting. 30 tablet 1   potassium chloride  (KLOR-CON ) 10 MEQ tablet Take 1 tablet (10 mEq total) by mouth as needed. 90 tablet 3   predniSONE  (DELTASONE ) 5 MG tablet Take 5 mg by mouth daily.     predniSONE  (DELTASONE ) 5 MG tablet Take 1 tablet (5 mg total) by mouth in the morning and at bedtime. 60 tablet 11   prochlorperazine  (COMPAZINE ) 10 MG tablet Take 1 tablet (10 mg total) by mouth every 6 (six) hours as needed for nausea or vomiting. 30 tablet 1   rosuvastatin  (CRESTOR ) 5 MG tablet TAKE 1 TABLET (5 MG TOTAL) BY MOUTH DAILY. 90 tablet 1   tamsulosin  (FLOMAX ) 0.4 MG CAPS capsule Take 0.4 mg by mouth 2 (two) times daily.     No current facility-administered medications for this visit.   Facility-Administered Medications Ordered in Other Visits  Medication Dose Route Frequency Provider Last Rate Last Admin   0.9 %  sodium chloride  infusion    Intravenous Continuous Sherrod Sherrod, MD 10 mL/hr at 06/26/24 1234 New Bag at 06/26/24 1234   DOCEtaxel  (TAXOTERE ) 160 mg in sodium chloride  0.9 % 250 mL chemo infusion  75 mg/m2 (Treatment Plan Recorded) Intravenous Once Sherrod Sherrod, MD        SURGICAL HISTORY:  Past Surgical History:  Procedure Laterality Date   BASAL CELL CARCINOMA EXCISION  05/2018   lip, 2020 x3  basal cells removed    CATARACT EXTRACTION  2008   Left   CATARACT EXTRACTION  2013   Right ([place])   COLONOSCOPY  ~2010   medium int hemorrhoids, o/w WNL, rpt 5 yrs given fmhx (Dr. Celestia)   COLONOSCOPY  08/2019   TAs, diverticulosis, rpt 3 yrs ([place])   COLONOSCOPY  12/2023   2TAs, diverticulosis, int hem, no f/u needed (Armbruster)   CORONARY/GRAFT ACUTE MI REVASCULARIZATION N/A 09/07/2022   Procedure: Coronary/Graft Acute MI Revascularization;  Surgeon: Thukkani, Arun K, MD;  Location: [facility] INV[facility];  Service: Cardiovascular;  Laterality: N/A;   ENDARTERECTOMY Right 12/01/2019   Procedure: RIGHT ENDARTERECTOMY CAROTID;  Surgeon: Gretta Lonni PARAS, MD;  Location[facility] OR;  Service: Vascular;  Laterality: Right;   exercise treadmill  11/2005   WNL ([place])   FINE NEEDLE ASPIRATION  07/28/2022   Procedure: FINE NEEDLE ASPIRATION (FNA) LINEAR;  Surgeon: Brenna Adine CROME, DO;  Location: [facility] ENDOSCOPY;  Service: Pulmonary;;   HERNIA REPAIR  2008   Right   IR IMAGING GUIDED PORT INSERTION  08/11/2022   IR IMAGING GUIDED PORT INSERTION  10/08/2022   IR REMOVAL TUN ACCESS W/ PORT W/O FL MOD SED  09/10/2022   LEFT HEART CATH AND CORONARY ANGIOGRAPHY N/A 09/07/2022   Procedure: LEFT HEART CATH AND CORONARY ANGIOGRAPHY;  Surgeon: Wendel Lurena POUR, MD;  Location: [facility] INVAS[facility] CV LAB;  Service: Cardiovascular;  Laterality: N/A;   MOHS SURGERY Left spring 2014   basal cell face   PATCH ANGIOPLASTY Right 12/01/2019   Procedure: PATCH ANGIOPLASTY USING XENOSURE BIOLOGIC PATCH;  Surgeon: Gretta Lonni PARAS,  MD;  Location: [facility] OR;  Service: Vascular;  Laterality: Right;   Testicular Biopsy  2003   benign, varicocele   US  ECHOCARDIOGRAPHY  11/2005   aortic sclerosis, EF 55-60%, diastolic dysfunction   VIDEO BRONCHOSCOPY WITH ENDOBRONCHIAL ULTRASOUND Bilateral 07/28/2022   Procedure: VIDEO BRONCHOSCOPY WITH ENDOBRONCHIAL ULTRASOUND;  Surgeon: Brenna Adine CROME, DO;  Location: [facility] ENDOSCOPY;  Service: Pulmonary;  Laterality: Bilateral;    REVIEW OF SYSTEMS:   Review of  Systems  Constitutional: Positive for fatigue and decreased appetite. Negative for  chills and fever.  HENT: Negative for mouth sores, nosebleeds, sore throat and trouble swallowing.   Eyes: Negative for eye problems and icterus.  Respiratory: Positive for occasional dyspnea on exertion. Negative for cough, hemoptysis, and wheezing.   Cardiovascular: Negative for chest pain and leg swelling.  Gastrointestinal: Negative for abdominal pain, constipation, diarrhea, nausea and vomiting.  Genitourinary: Negative for bladder incontinence, difficulty urinating, dysuria, frequency and hematuria.   Musculoskeletal: Positive for back pain. Negative for gait problem, neck pain and neck stiffness.  Skin: Negative for itching and rash.  Neurological: Negative for dizziness, extremity weakness, gait problem, headaches, light-headedness and seizures.  Hematological: Negative for adenopathy. Does not bruise/bleed easily.  Psychiatric/Behavioral: Negative for confusion, depression and sleep disturbance. The patient is not nervous/anxious.     PHYSICAL EXAMINATION:  Blood pressure 136/62, pulse 74, temperature (!) 97.5 F (36.4 C), resp. rate 20, weight 191 lb 1.6 oz (86.7 kg).  ECOG PERFORMANCE STATUS: 1  Physical Exam  Constitutional: Oriented to person, place, and time and well-developed, well-nourished, and in no distress.  HENT:  Head: Normocephalic and atraumatic.  Mouth/Throat: Oropharynx is clear and moist. No oropharyngeal exudate.   Eyes: Conjunctivae are normal. Right eye exhibits no discharge. Left eye exhibits no discharge. No scleral icterus.  Neck: Normal range of motion. Neck supple.  Cardiovascular: Normal rate, regular rhythm, normal heart sounds and intact distal pulses.   Pulmonary/Chest: Effort normal and breath sounds normal. No respiratory distress. No wheezes. No rales.  Abdominal: Soft. Bowel sounds are normal. Exhibits no distension and no mass. There is no tenderness.  Musculoskeletal: Normal range of motion. Exhibits no edema.  Lymphadenopathy:    No cervical adenopathy.  Neurological: Alert and oriented to person, place, and time. Exhibits normal muscle tone. Gait normal. Coordination normal.  Skin: Skin is warm and dry. No rash noted. Not diaphoretic. No erythema. No pallor.  Psychiatric: Mood, memory and judgment normal.  Vitals reviewed.  LABORATORY DATA: Lab Results  Component Value Date   WBC 6.5 06/26/2024   HGB 11.0 (L) 06/26/2024   HCT 31.4 (L) 06/26/2024   MCV 99.7 06/26/2024   PLT 157 06/26/2024      Chemistry      Component Value Date/Time   NA 139 06/26/2024 1102   NA 140 05/31/2023 1417   K 3.9 06/26/2024 1102   CL 104 06/26/2024 1102   CO2 22 06/26/2024 1102   BUN 20 06/26/2024 1102   BUN 14 05/31/2023 1417   CREATININE 0.81 06/26/2024 1102      Component Value Date/Time   CALCIUM  9.2 06/26/2024 1102   ALKPHOS 231 (H) 06/26/2024 1102   AST 33 06/26/2024 1102   ALT 25 06/26/2024 1102   BILITOT 0.6 06/26/2024 1102       RADIOGRAPHIC STUDIES:  No results found.   ASSESSMENT/PLAN:  This is a very pleasant 78 year old Caucasian male diagnosed with  1) metastatic poorly differentiated carcinoma, neuroendocrine carcinoma of suspicious prostate primary versus primary lung cancer. He presented with innumerable and bilateral pulmonary nodules in addition to right hilar and mediastinal lymphadenopathy and metastatic disease to several areas of the bone diagnosed in  January 2024. 2) Prostate adenocarcinoma diagnosed in February 2024 currently managed by Dr. Nieves   He was started on systemic chemotherapy with carboplatin  for an AUC of 5 on day 1, etoposide  100 mg/m on days 1, 2, and 3 and immunotherapy with Imfinzi  1500 mg  IV every 3 weeks with Neulasta  support.  He is status post 4 cycles of treatment. Starting from cycle #5, he started maintenance immunotherapy with Imfinzi  1500 mg IV every 4 weeks. This was discontinued due to immunotherapy mediated hepatitis.   He was on observation until e had PMS a PET scan performed recently and it showed widespread PSMA avid osseous metastasis involving the sacrum, bilateral posterior iliac veins, right inferior pubic ramus, L1 vertebral body well as left T6 transverse process but no DMSA avid thoracic lymph nodes and a stable small pulmonary nodules without PSMA avid tumor and small left pleural effusion.   He then underwent palliative radiation to the posterior iliac bones to the care of Dr. Dewey.  This was completed on June 14, 2024  Dr. Sherrod recommended ADT, docetaxel , and Zytiga.   He also will receive eligard through his urologist. He will confirm he has an upcoming appointment when he is due for this. If he needs us  to take over, then he will let us  know.   Labs were reviewed. He will proceed with cycle #1 today as scheduled.   Clarified Decadron  regimen: 8 mg BID day before, day of, and day after chemotherapy, hold prednisone  during these days.  We will see him back for labs and follow up in 3 weeks before undergoing cycle #2.   Back pain - Provided samples of topical analgesic patches for relief. - Recommended continued use of ice and heat. - Supported evaluation with orthopedic spine specialist for further assessment and management - Referred to palliative care nurse practitioner for additional pain management. --Discussed potential MRI benefit but will wait until he sees spine  specialist.  Prophylactic growth factor support provided. Tolerated prior injections without significant complications. No recent infections or febrile episodes. - Confirmed growth factor injection scheduled two days post-chemotherapy. - Instructed to take Claritin daily for one week post-injection to mitigate bone pain. - Advised use of acetaminophen  or Norco for bone pain associated with injection. - Scheduled weekly lab monitoring during chemotherapy for cytopenias.  - Advised small, frequent meals to support nutrition.  Aortic stenosis Aortic stenosis with exertional dyspnea and tachycardia. No acute decompensation. Scheduled cardiology follow-up in January. Blood pressure and heart rate require monitoring. - Advised home blood pressure monitoring several times per week with log for cardiology review. - Reinforced importance of maintaining scheduled cardiology follow-up.  The patient was advised to call immediately if she has any concerning symptoms in the interval. The patient voices understanding of current disease status and treatment options and is in agreement with the current care plan. All questions were answered. The patient knows to call the clinic with any problems, questions or concerns. We can certainly see the patient much sooner if necessary    Orders Placed This Encounter  Procedures   Amb Referral to Palliative Care    Referral Priority:   Routine    Referral Type:   Consultation    Number of Visits Requested:   1  The total time spent in the appointment was 30-39 minutes  Jalon Blackwelder L Iram Astorino, PA-C 06/26/2024

## 2024-06-26 ENCOUNTER — Inpatient Hospital Stay

## 2024-06-26 ENCOUNTER — Inpatient Hospital Stay: Admitting: Physician Assistant

## 2024-06-26 VITALS — BP 146/78 | HR 64 | Temp 98.1°F | Resp 18

## 2024-06-26 VITALS — BP 136/62 | HR 74 | Temp 97.5°F | Resp 20 | Wt 191.1 lb

## 2024-06-26 DIAGNOSIS — C61 Malignant neoplasm of prostate: Secondary | ICD-10-CM | POA: Diagnosis not present

## 2024-06-26 DIAGNOSIS — C7A1 Malignant poorly differentiated neuroendocrine tumors: Secondary | ICD-10-CM | POA: Diagnosis not present

## 2024-06-26 DIAGNOSIS — C3490 Malignant neoplasm of unspecified part of unspecified bronchus or lung: Secondary | ICD-10-CM | POA: Diagnosis not present

## 2024-06-26 LAB — CMP (CANCER CENTER ONLY)
ALT: 25 U/L (ref 0–44)
AST: 33 U/L (ref 15–41)
Albumin: 4 g/dL (ref 3.5–5.0)
Alkaline Phosphatase: 231 U/L — ABNORMAL HIGH (ref 38–126)
Anion gap: 12 (ref 5–15)
BUN: 20 mg/dL (ref 8–23)
CO2: 22 mmol/L (ref 22–32)
Calcium: 9.2 mg/dL (ref 8.9–10.3)
Chloride: 104 mmol/L (ref 98–111)
Creatinine: 0.81 mg/dL (ref 0.61–1.24)
GFR, Estimated: >60 mL/min
Glucose, Bld: 181 mg/dL — ABNORMAL HIGH (ref 70–99)
Potassium: 3.9 mmol/L (ref 3.5–5.1)
Sodium: 139 mmol/L (ref 135–145)
Total Bilirubin: 0.6 mg/dL (ref 0.0–1.2)
Total Protein: 6.7 g/dL (ref 6.5–8.1)

## 2024-06-26 LAB — CBC WITH DIFFERENTIAL (CANCER CENTER ONLY)
Abs Immature Granulocytes: 0.04 K/uL (ref 0.00–0.07)
Basophils Absolute: 0 K/uL (ref 0.0–0.1)
Basophils Relative: 0 %
Eosinophils Absolute: 0 K/uL (ref 0.0–0.5)
Eosinophils Relative: 0 %
HCT: 31.4 % — ABNORMAL LOW (ref 39.0–52.0)
Hemoglobin: 11 g/dL — ABNORMAL LOW (ref 13.0–17.0)
Immature Granulocytes: 1 %
Lymphocytes Relative: 6 %
Lymphs Abs: 0.4 K/uL — ABNORMAL LOW (ref 0.7–4.0)
MCH: 34.9 pg — ABNORMAL HIGH (ref 26.0–34.0)
MCHC: 35 g/dL (ref 30.0–36.0)
MCV: 99.7 fL (ref 80.0–100.0)
Monocytes Absolute: 0.3 K/uL (ref 0.1–1.0)
Monocytes Relative: 5 %
Neutro Abs: 5.8 K/uL (ref 1.7–7.7)
Neutrophils Relative %: 88 %
Platelet Count: 157 K/uL (ref 150–400)
RBC: 3.15 MIL/uL — ABNORMAL LOW (ref 4.22–5.81)
RDW: 13.7 % (ref 11.5–15.5)
WBC Count: 6.5 K/uL (ref 4.0–10.5)
nRBC: 0 % (ref 0.0–0.2)

## 2024-06-26 MED ORDER — DEXAMETHASONE SOD PHOSPHATE PF 10 MG/ML IJ SOLN
10.0000 mg | Freq: Once | INTRAMUSCULAR | Status: AC
  Administered 2024-06-26: 10 mg via INTRAVENOUS

## 2024-06-26 MED ORDER — SODIUM CHLORIDE 0.9 % IV SOLN
75.0000 mg/m2 | Freq: Once | INTRAVENOUS | Status: AC
  Administered 2024-06-26: 160 mg via INTRAVENOUS
  Filled 2024-06-26: qty 16

## 2024-06-26 MED ORDER — SODIUM CHLORIDE 0.9 % IV SOLN: INTRAVENOUS | Status: DC

## 2024-06-26 NOTE — Patient Instructions (Signed)
 [facility] - A DEPT OF [facility]  Discharge Instructions: Thank you for choosing [facility] to provide your oncology and hematology care.   If you have a lab appointment with the Cancer Center, please go directly to the Cancer Center and check in at the registration area.   Wear comfortable clothing and clothing appropriate for easy access to any Portacath or PICC line.   We strive to give you quality time with your provider. You may need to reschedule your appointment if you arrive late (15 or more minutes).  Arriving late affects you and other patients whose appointments are after yours.  Also, if you miss three or more appointments without notifying the office, you may be dismissed from the clinic at the provider's discretion.      For prescription refill requests, have your pharmacy contact our office and allow 72 hours for refills to be completed.    Today you received the following chemotherapy and/or immunotherapy agents: Docetaxel  (Taxotere )      To help prevent nausea and vomiting after your treatment, we encourage you to take your nausea medication as directed.  BELOW ARE SYMPTOMS THAT SHOULD BE REPORTED IMMEDIATELY: *FEVER GREATER THAN 100.4 F (38 C) OR HIGHER *CHILLS OR SWEATING *NAUSEA AND VOMITING THAT IS NOT CONTROLLED WITH YOUR NAUSEA MEDICATION *UNUSUAL SHORTNESS OF BREATH *UNUSUAL BRUISING OR BLEEDING *URINARY PROBLEMS (pain or burning when urinating, or frequent urination) *BOWEL PROBLEMS (unusual diarrhea, constipation, pain near the anus) TENDERNESS IN MOUTH AND THROAT WITH OR WITHOUT PRESENCE OF ULCERS (sore throat, sores in mouth, or a toothache) UNUSUAL RASH, SWELLING OR PAIN  UNUSUAL VAGINAL DISCHARGE OR ITCHING   Items with * indicate a potential emergency and should be followed up as soon as possible or go to the Emergency Department if any problems should occur.  Please show the CHEMOTHERAPY ALERT CARD or  IMMUNOTHERAPY ALERT CARD at check-in to the Emergency Department and triage nurse.  Should you have questions after your visit or need to cancel or reschedule your appointment, please contact [facility] - A DEPT OF [facility]  Dept: [phone]  and follow the prompts.  Office hours are 8:00 a.m. to 4:30 p.m. Monday - Friday. Please note that voicemails left after 4:00 p.m. may not be returned until the following business day.  We are closed weekends and major holidays. You have access to a nurse at all times for urgent questions. Please call the main number to the clinic Dept: [phone] and follow the prompts.   For any non-urgent questions, you may also contact your provider using MyChart. We now offer e-Visits for anyone 65 and older to request care online for non-urgent symptoms. For details visit [email or web address].   Also download the MyChart app! Go to the app store, search "MyChart", open the app, select [place], and log in with your MyChart username and password.

## 2024-06-28 ENCOUNTER — Inpatient Hospital Stay

## 2024-06-28 VITALS — BP 135/73 | HR 68 | Resp 17

## 2024-06-28 DIAGNOSIS — C7A1 Malignant poorly differentiated neuroendocrine tumors: Secondary | ICD-10-CM

## 2024-06-28 DIAGNOSIS — C61 Malignant neoplasm of prostate: Secondary | ICD-10-CM | POA: Diagnosis not present

## 2024-06-28 MED ORDER — PEGFILGRASTIM-CBQV 6 MG/0.6ML ~~LOC~~ SOSY
6.0000 mg | PREFILLED_SYRINGE | Freq: Once | SUBCUTANEOUS | Status: AC
  Administered 2024-06-28: 6 mg via SUBCUTANEOUS
  Filled 2024-06-28: qty 0.6

## 2024-06-30 ENCOUNTER — Other Ambulatory Visit: Payer: Self-pay

## 2024-06-30 ENCOUNTER — Telehealth: Payer: Self-pay

## 2024-06-30 DIAGNOSIS — C7A1 Malignant poorly differentiated neuroendocrine tumors: Secondary | ICD-10-CM

## 2024-06-30 NOTE — Telephone Encounter (Signed)
 Voicemail received from the patient reporting pain. Patient stated he received Tx and an injection this week and had some additional questions. This medical assistant attempted to contact the patient via telephone call [phone], same number left in the message. Unable to reach the patient. Left vmail stating to head to the nearest emergency department for any urgent needs since the office is closing soon.

## 2024-07-01 ENCOUNTER — Observation Stay
Admission: EM | Admit: 2024-07-01 | Discharge: 2024-07-03 | Disposition: A | Discharge status: Home / Self Care | Attending: Student | Admitting: Student

## 2024-07-01 ENCOUNTER — Other Ambulatory Visit: Payer: Self-pay

## 2024-07-01 ENCOUNTER — Observation Stay

## 2024-07-01 ENCOUNTER — Emergency Department

## 2024-07-01 DIAGNOSIS — K7460 Unspecified cirrhosis of liver: Secondary | ICD-10-CM | POA: Insufficient documentation

## 2024-07-01 DIAGNOSIS — C61 Malignant neoplasm of prostate: Secondary | ICD-10-CM | POA: Diagnosis not present

## 2024-07-01 DIAGNOSIS — Z7901 Long term (current) use of anticoagulants: Secondary | ICD-10-CM | POA: Diagnosis not present

## 2024-07-01 DIAGNOSIS — X58XXXA Exposure to other specified factors, initial encounter: Secondary | ICD-10-CM | POA: Insufficient documentation

## 2024-07-01 DIAGNOSIS — S32028A Other fracture of second lumbar vertebra, initial encounter for closed fracture: Secondary | ICD-10-CM | POA: Diagnosis not present

## 2024-07-01 DIAGNOSIS — E1169 Type 2 diabetes mellitus with other specified complication: Secondary | ICD-10-CM | POA: Diagnosis not present

## 2024-07-01 DIAGNOSIS — D708 Other neutropenia: Secondary | ICD-10-CM | POA: Diagnosis not present

## 2024-07-01 DIAGNOSIS — S32020A Wedge compression fracture of second lumbar vertebra, initial encounter for closed fracture: Secondary | ICD-10-CM

## 2024-07-01 DIAGNOSIS — C7989 Secondary malignant neoplasm of other specified sites: Secondary | ICD-10-CM | POA: Diagnosis not present

## 2024-07-01 DIAGNOSIS — E559 Vitamin D deficiency, unspecified: Secondary | ICD-10-CM | POA: Insufficient documentation

## 2024-07-01 DIAGNOSIS — E785 Hyperlipidemia, unspecified: Secondary | ICD-10-CM | POA: Diagnosis not present

## 2024-07-01 DIAGNOSIS — B370 Candidal stomatitis: Secondary | ICD-10-CM | POA: Insufficient documentation

## 2024-07-01 DIAGNOSIS — I10 Essential (primary) hypertension: Secondary | ICD-10-CM | POA: Insufficient documentation

## 2024-07-01 DIAGNOSIS — C3490 Malignant neoplasm of unspecified part of unspecified bronchus or lung: Secondary | ICD-10-CM | POA: Insufficient documentation

## 2024-07-01 DIAGNOSIS — D696 Thrombocytopenia, unspecified: Secondary | ICD-10-CM | POA: Diagnosis not present

## 2024-07-01 DIAGNOSIS — R2681 Unsteadiness on feet: Secondary | ICD-10-CM | POA: Insufficient documentation

## 2024-07-01 DIAGNOSIS — D709 Neutropenia, unspecified: Principal | ICD-10-CM | POA: Diagnosis present

## 2024-07-01 DIAGNOSIS — Z7982 Long term (current) use of aspirin: Secondary | ICD-10-CM | POA: Insufficient documentation

## 2024-07-01 DIAGNOSIS — K219 Gastro-esophageal reflux disease without esophagitis: Secondary | ICD-10-CM | POA: Insufficient documentation

## 2024-07-01 DIAGNOSIS — R2689 Other abnormalities of gait and mobility: Secondary | ICD-10-CM | POA: Diagnosis not present

## 2024-07-01 DIAGNOSIS — R531 Weakness: Secondary | ICD-10-CM | POA: Diagnosis present

## 2024-07-01 DIAGNOSIS — Z79899 Other long term (current) drug therapy: Secondary | ICD-10-CM | POA: Diagnosis not present

## 2024-07-01 LAB — URINALYSIS, ROUTINE W REFLEX MICROSCOPIC
Bacteria, UA: NONE SEEN
Bilirubin Urine: NEGATIVE
Glucose, UA: NEGATIVE mg/dL
Hgb urine dipstick: NEGATIVE
Ketones, ur: NEGATIVE mg/dL
Leukocytes,Ua: NEGATIVE
Nitrite: NEGATIVE
Protein, ur: 30 mg/dL — AB
Specific Gravity, Urine: 1.024 (ref 1.005–1.030)
Squamous Epithelial / HPF: 0 /HPF (ref 0–5)
pH: 6 (ref 5.0–8.0)

## 2024-07-01 LAB — COMPREHENSIVE METABOLIC PANEL WITH GFR
ALT: 22 U/L (ref 0–44)
AST: 30 U/L (ref 15–41)
Albumin: 3.7 g/dL (ref 3.5–5.0)
Alkaline Phosphatase: 174 U/L — ABNORMAL HIGH (ref 38–126)
Anion gap: 11 (ref 5–15)
BUN: 22 mg/dL (ref 8–23)
CO2: 22 mmol/L (ref 22–32)
Calcium: 8.5 mg/dL — ABNORMAL LOW (ref 8.9–10.3)
Chloride: 95 mmol/L — ABNORMAL LOW (ref 98–111)
Creatinine, Ser: 0.78 mg/dL (ref 0.61–1.24)
GFR, Estimated: >60 mL/min
Glucose, Bld: 121 mg/dL — ABNORMAL HIGH (ref 70–99)
Potassium: 4.1 mmol/L (ref 3.5–5.1)
Sodium: 128 mmol/L — ABNORMAL LOW (ref 135–145)
Total Bilirubin: 1.2 mg/dL (ref 0.0–1.2)
Total Protein: 6 g/dL — ABNORMAL LOW (ref 6.5–8.1)

## 2024-07-01 LAB — GLUCOSE, CAPILLARY: Glucose-Capillary: 115 mg/dL — ABNORMAL HIGH (ref 70–99)

## 2024-07-01 LAB — CBC
HCT: 35.5 % — ABNORMAL LOW (ref 39.0–52.0)
Hemoglobin: 12.1 g/dL — ABNORMAL LOW (ref 13.0–17.0)
MCH: 34.7 pg — ABNORMAL HIGH (ref 26.0–34.0)
MCHC: 34.1 g/dL (ref 30.0–36.0)
MCV: 101.7 fL — ABNORMAL HIGH (ref 80.0–100.0)
Platelets: 95 K/uL — ABNORMAL LOW (ref 150–400)
RBC: 3.49 MIL/uL — ABNORMAL LOW (ref 4.22–5.81)
RDW: 13.5 % (ref 11.5–15.5)
WBC: 0.4 K/uL — CL (ref 4.0–10.5)
nRBC: 0 % (ref 0.0–0.2)

## 2024-07-01 LAB — DIFFERENTIAL
Abs Immature Granulocytes: 0.01 K/uL (ref 0.00–0.07)
Basophils Absolute: 0 K/uL (ref 0.0–0.1)
Basophils Relative: 2 %
Eosinophils Absolute: 0.1 K/uL (ref 0.0–0.5)
Eosinophils Relative: 19 %
Immature Granulocytes: 2 %
Lymphocytes Relative: 50 %
Lymphs Abs: 0.2 K/uL — ABNORMAL LOW (ref 0.7–4.0)
Monocytes Absolute: 0.1 K/uL (ref 0.1–1.0)
Monocytes Relative: 10 %
Neutro Abs: 0.1 K/uL — CL (ref 1.7–7.7)
Neutrophils Relative %: 17 %
Smear Review: NORMAL

## 2024-07-01 LAB — PROTIME-INR
INR: 1.1 (ref 0.8–1.2)
Prothrombin Time: 14.7 s (ref 11.4–15.2)

## 2024-07-01 LAB — RESP PANEL BY RT-PCR (RSV, FLU A&B, COVID)  RVPGX2
Influenza A by PCR: NEGATIVE
Influenza B by PCR: NEGATIVE
Resp Syncytial Virus by PCR: NEGATIVE
SARS Coronavirus 2 by RT PCR: NEGATIVE

## 2024-07-01 LAB — PROCALCITONIN: Procalcitonin: 0.23 ng/mL

## 2024-07-01 LAB — TECHNOLOGIST SMEAR REVIEW: Plt Morphology: NORMAL

## 2024-07-01 LAB — IMMATURE PLATELET FRACTION: Immature Platelet Fraction: 3.6 % (ref 1.2–8.6)

## 2024-07-01 LAB — LACTIC ACID, PLASMA: Lactic Acid, Venous: 1.5 mmol/L (ref 0.5–1.9)

## 2024-07-01 LAB — MAGNESIUM: Magnesium: 1.7 mg/dL (ref 1.7–2.4)

## 2024-07-01 LAB — CBG MONITORING, ED: Glucose-Capillary: 117 mg/dL — ABNORMAL HIGH (ref 70–99)

## 2024-07-01 MED ORDER — SODIUM CHLORIDE 0.9% FLUSH
3.0000 mL | Freq: Two times a day (BID) | INTRAVENOUS | Status: DC
  Administered 2024-07-02 – 2024-07-03 (×4): 3 mL via INTRAVENOUS

## 2024-07-01 MED ORDER — SODIUM CHLORIDE 0.9 % IV SOLN
2.0000 g | Freq: Once | INTRAVENOUS | Status: AC
  Administered 2024-07-01: 2 g via INTRAVENOUS
  Filled 2024-07-01: qty 12.5

## 2024-07-01 MED ORDER — NYSTATIN 100000 UNIT/ML MT SUSP
5.0000 mL | Freq: Four times a day (QID) | OROMUCOSAL | Status: DC
  Administered 2024-07-01 – 2024-07-03 (×6): 500000 [IU] via ORAL
  Filled 2024-07-01 (×6): qty 5

## 2024-07-01 MED ORDER — IBUPROFEN 400 MG PO TABS
400.0000 mg | ORAL_TABLET | Freq: Once | ORAL | Status: AC
  Administered 2024-07-01: 400 mg via ORAL
  Filled 2024-07-01: qty 1

## 2024-07-01 MED ORDER — VANCOMYCIN HCL IN DEXTROSE 1-5 GM/200ML-% IV SOLN
1000.0000 mg | Freq: Once | INTRAVENOUS | Status: AC
  Administered 2024-07-01: 1000 mg via INTRAVENOUS
  Filled 2024-07-01: qty 200

## 2024-07-01 MED ORDER — PHENOL 1.4 % MT LIQD
1.0000 | OROMUCOSAL | Status: DC | PRN
  Filled 2024-07-01: qty 177

## 2024-07-01 MED ORDER — LACTATED RINGERS IV SOLN
150.0000 mL/h | INTRAVENOUS | Status: AC
  Administered 2024-07-01 – 2024-07-02 (×2): 150 mL/h via INTRAVENOUS

## 2024-07-01 MED ORDER — VANCOMYCIN HCL IN DEXTROSE 1-5 GM/200ML-% IV SOLN
1000.0000 mg | Freq: Once | INTRAVENOUS | Status: AC
  Administered 2024-07-02: 1000 mg via INTRAVENOUS
  Filled 2024-07-01: qty 200

## 2024-07-01 MED ORDER — LACTATED RINGERS IV BOLUS (SEPSIS)
1000.0000 mL | Freq: Once | INTRAVENOUS | Status: AC
  Administered 2024-07-01: 1000 mL via INTRAVENOUS

## 2024-07-01 MED ORDER — INSULIN ASPART 100 UNIT/ML IJ SOLN
0.0000 [IU] | Freq: Three times a day (TID) | INTRAMUSCULAR | Status: DC
  Administered 2024-07-02 (×2): 1 [IU] via SUBCUTANEOUS
  Filled 2024-07-01 (×2): qty 1

## 2024-07-01 MED ORDER — LACTATED RINGERS IV SOLN: INTRAVENOUS | Status: DC

## 2024-07-01 MED ORDER — METRONIDAZOLE 500 MG/100ML IV SOLN
500.0000 mg | Freq: Once | INTRAVENOUS | Status: AC
  Administered 2024-07-01: 500 mg via INTRAVENOUS
  Filled 2024-07-01: qty 100

## 2024-07-01 MED ORDER — INSULIN ASPART 100 UNIT/ML IJ SOLN: 0.0000 [IU] | Freq: Every day | INTRAMUSCULAR | Status: DC

## 2024-07-01 MED ORDER — HYDROCODONE-ACETAMINOPHEN 5-325 MG PO TABS
1.0000 | ORAL_TABLET | Freq: Four times a day (QID) | ORAL | Status: DC | PRN
  Administered 2024-07-02: 2 via ORAL
  Administered 2024-07-02: 1 via ORAL
  Administered 2024-07-02 – 2024-07-03 (×3): 2 via ORAL
  Filled 2024-07-01 (×4): qty 2
  Filled 2024-07-01: qty 1

## 2024-07-01 MED ORDER — MELATONIN 5 MG PO TABS
5.0000 mg | ORAL_TABLET | Freq: Every day | ORAL | Status: DC
  Administered 2024-07-01 – 2024-07-02 (×2): 5 mg via ORAL
  Filled 2024-07-01 (×2): qty 1

## 2024-07-01 MED ORDER — IOHEXOL 300 MG/ML  SOLN
100.0000 mL | Freq: Once | INTRAMUSCULAR | Status: AC | PRN
  Administered 2024-07-01: 100 mL via INTRAVENOUS

## 2024-07-01 MED ORDER — PANTOPRAZOLE SODIUM 40 MG PO TBEC
40.0000 mg | DELAYED_RELEASE_TABLET | Freq: Every day | ORAL | Status: DC
  Administered 2024-07-01 – 2024-07-03 (×3): 40 mg via ORAL
  Filled 2024-07-01 (×3): qty 1

## 2024-07-01 MED ORDER — VANCOMYCIN HCL 1750 MG/350ML IV SOLN: 1750.0000 mg | INTRAVENOUS | Status: DC

## 2024-07-01 MED ORDER — SENNOSIDES-DOCUSATE SODIUM 8.6-50 MG PO TABS: 1.0000 | ORAL_TABLET | Freq: Every evening | ORAL | Status: DC | PRN

## 2024-07-01 MED ORDER — HEPARIN SODIUM (PORCINE) 5000 UNIT/ML IJ SOLN
5000.0000 [IU] | Freq: Three times a day (TID) | INTRAMUSCULAR | Status: DC
  Administered 2024-07-01 – 2024-07-02 (×2): 5000 [IU] via SUBCUTANEOUS
  Filled 2024-07-01 (×2): qty 1

## 2024-07-01 MED ORDER — ROSUVASTATIN CALCIUM 10 MG PO TABS
5.0000 mg | ORAL_TABLET | Freq: Every day | ORAL | Status: DC
  Administered 2024-07-02 – 2024-07-03 (×2): 5 mg via ORAL
  Filled 2024-07-01 (×2): qty 1

## 2024-07-01 MED ORDER — SODIUM CHLORIDE 0.9 % IV SOLN
2.0000 g | Freq: Three times a day (TID) | INTRAVENOUS | Status: DC
  Administered 2024-07-02 – 2024-07-03 (×5): 2 g via INTRAVENOUS
  Filled 2024-07-01 (×6): qty 12.5

## 2024-07-01 MED ORDER — METRONIDAZOLE 500 MG/100ML IV SOLN
500.0000 mg | Freq: Two times a day (BID) | INTRAVENOUS | Status: DC
  Administered 2024-07-02 – 2024-07-03 (×3): 500 mg via INTRAVENOUS
  Filled 2024-07-01 (×4): qty 100

## 2024-07-01 NOTE — ED Notes (Signed)
 Pt ambulatory to restroom x1 assistance. Urine sample collected. Pt returned to bed. Being transported to CT at this time.

## 2024-07-01 NOTE — Progress Notes (Signed)
 CRITICAL VALUE STICKER  CRITICAL VALUE:Absolute neutraphil count 0.1  RECEIVER: Larry Kent  DATE & TIME NOTIFIED: 07/01/24 @ 2040  MD NOTIFIED: [place]  TIME OF NOTIFICATION: 2040

## 2024-07-01 NOTE — Progress Notes (Signed)
"  ° °      CROSS COVER NOTE  NAME: Larry Kent MRN: [id number] DOB : 06-Apr-1946    Concern as stated by nurse / staff   Critical WBC 0.1     Pertinent findings on chart review: -Patient admitted earlier with severe neutropenia, fever chills, possible colitis -Code sepsis was initiated in the ED-received cefepime  metronidazole  and Flagyl  -Antibiotics not continued on admission-attributed to chemo -Oncology was consulted on admission  Patient Assessment  Date/Time Temp Pulse Resp BP SpO2 O2 Device  07/01/24 20:52:03 99.2 F (37.3 C) 80 18 134/67 98 % Room Air  07/01/24 1830 -- 93 23 Abnormal  104/55 Abnormal  92 % Room Air  07/01/24 1600 100.1 F (37.8 C) -- -- -- -- --      Latest Ref Rng & Units 07/01/2024    1:00 PM 06/26/2024   11:02 AM 05/29/2024    1:18 PM  CBC  WBC 4.0 - 10.5 K/uL 0.4  6.5  12.9   Hemoglobin 13.0 - 17.0 g/dL 87.8  88.9  87.8   Hematocrit 39.0 - 52.0 % 35.5  31.4  34.3   Platelets 150 - 400 K/uL 95  157  168       Assessment and  Interventions   Assessment:  Febrile neutropenia: Will treat as sepsis pending neurology input  Plan: Sepsis order set initiated: Cefepime  Vanco and Flagyl  oncology consult to determine need for G-CSF X       "

## 2024-07-01 NOTE — Progress Notes (Signed)
 CODE SEPSIS - PHARMACY COMMUNICATION  **Broad Spectrum Antibiotics should be administered within 1 hour of Sepsis diagnosis**  Time Code Sepsis Called/Page Received: 1326  Antibiotics Ordered: cefepime , metronidazole , and vancomycin   Time of 1st antibiotic administration: 1404  Additional action taken by pharmacy: None  If necessary, Name of Provider/Nurse Contacted: None    Larry Kent ,PharmD Clinical Pharmacist  07/01/2024  1:31 PM

## 2024-07-01 NOTE — ED Provider Notes (Signed)
 79 year old male with history of metastatic prostate cancer who was signed out to me awaiting CT imaging with concern of neutropenic fever.  Temperature 100.2 here orally, neutrophil count of 0.4 and slightly tachycardic upon arrival concerning for underlying neutropenic fever.  No clear source established as of yet, urinalysis is negative, chest x-ray without evidence of pneumonia.  Clinically does not have any signs or symptoms of meningitis.  Primary complaint is lower abdominal pressure or discomfort which may be secondary to urinary retention but seems that he has been able to adequately void.  Will obtain CT imaging of the abdomen pelvis and anticipate likely admission afterwards.  Spoke with Dr. Claudene from neurosurgery, no acute intervention recommended at this time.  Spoke with Dr. Fernand from medicine team, plan for admission to medicine service at this time.   Fernand Rossie HERO, MD 07/01/24 2240

## 2024-07-01 NOTE — ED Notes (Signed)
"  Blue top sent   "

## 2024-07-01 NOTE — ED Notes (Signed)
 This RN attempted to collect a urine sample from pt. Pt denies the need to urinate at this time.

## 2024-07-01 NOTE — ED Provider Notes (Signed)
 "  [facility] Provider Note   Event Date/Time   First MD Initiated Contact with Patient 07/01/24 1324     (approximate) History  Weakness  HPI KRISTINE TILEY is a 79 y.o. male with a past medical history of prostate cancer with bony metastasis currently on docetaxel  with last infusion on 05/2010/25 as well as erythropoietin injection 2 days later who presents complaining of worsening fatigue, subjective fevers, and suprapubic pressure with urination.  Patient also complains of significant right lower extremity pain with putting any weight on this right leg.  Patient states that this has been new over the last few days however states that he has no sciatic pain on this right side as well.  Patient denies any recent travel, sick contacts, or food out of the ordinary ROS: Patient currently denies any vision changes, tinnitus, difficulty speaking, facial droop, sore throat, chest pain, shortness of breath, abdominal pain, nausea/vomiting/diarrhea, dysuria, or weakness/numbness/paresthesias in any extremity   Physical Exam  Triage Vital Signs: ED Triage Vitals  Encounter Vitals Group     BP 07/01/24 1255 113/70     Girls Systolic BP Percentile --      Girls Diastolic BP Percentile --      Boys Systolic BP Percentile --      Boys Diastolic BP Percentile --      Pulse Rate 07/01/24 1255 96     Resp 07/01/24 1255 20     Temp 07/01/24 1255 100.2 F (37.9 C)     Temp Source 07/01/24 1255 Oral     SpO2 07/01/24 1255 98 %     Weight 07/01/24 1258 190 lb 14.7 oz (86.6 kg)     Height 07/01/24 1258 5' 3 (1.6 m)     Head Circumference --      Peak Flow --      Pain Score 07/01/24 1256 8     Pain Loc --      Pain Education --      Exclude from Growth Chart --    Most recent vital signs: Vitals:   07/01/24 1255  BP: 113/70  Pulse: 96  Resp: 20  Temp: 100.2 F (37.9 C)  SpO2: 98%   General: Awake, oriented x4. CV:  Good peripheral perfusion. Resp:  Normal  effort. Abd:  No distention. Other:  Elderly obese Caucasian male resting comfortably in no acute distress ED Results / Procedures / Treatments  Labs (all labs ordered are listed, but only abnormal results are displayed) Labs Reviewed  CBG MONITORING, ED - Abnormal; Notable for the following components:      Result Value   Glucose-Capillary 117 (*)    All other components within normal limits  CULTURE, BLOOD (ROUTINE X 2)  CULTURE, BLOOD (ROUTINE X 2)  COMPREHENSIVE METABOLIC PANEL WITH GFR  CBC  URINALYSIS, ROUTINE W REFLEX MICROSCOPIC  LACTIC ACID, PLASMA  LACTIC ACID, PLASMA  PROTIME-INR   PROCEDURES: Critical Care performed: No Procedures MEDICATIONS ORDERED IN ED: Medications - No data to display IMPRESSION / MDM / ASSESSMENT AND PLAN / ED COURSE  I reviewed the triage vital signs and the nursing notes.                             The patient is on the cardiac monitor to evaluate for evidence of arrhythmia and/or significant heart rate changes. Patient's presentation is most consistent with acute presentation with potential threat to life or  bodily function. Patient is a 79 year old male with the above-stated past medical history presents complaining of of generalized fatigue, right lower extremity pain, subjective fevers, and suprapubic pressure with urination DDx: Influenza infection, COVID infection, UTI, sepsis, neutropenic fever Plan: Sepsis order set initiated IVF: 30 cc/kg LR ABX: Cefepime , Flagyl , vancomycin   Care of this patient will be signed out to the oncoming physician at the end of my shift.  All pertinent patient information conveyed and all questions answered.  All further care and disposition decisions will be made by the oncoming physician.   FINAL CLINICAL IMPRESSION(S) / ED DIAGNOSES   Final diagnoses:  None   Rx / DC Orders   ED Discharge Orders     None      Note:  This document was prepared using Dragon voice recognition software and may  include unintentional dictation errors.   Jossie Artist POUR, MD 07/01/24 1514  "

## 2024-07-01 NOTE — ED Triage Notes (Signed)
 Pt to ED for weakness since 3 days ago after getting EPO injection on Wednesday. Pt is on chemo for prostate cancer with lung mets, last session this past Monday. Also having bilateral leg pain worse with walking respirations unlabored. Sending lactic and 1 set cultures with labs.

## 2024-07-01 NOTE — ED Notes (Signed)
 Pt assisted from recliner to chair by NT.

## 2024-07-01 NOTE — H&P (Addendum)
 " History and Physical    Larry Kent FMW:[id number] DOB: 12/02/1945 DOA: 07/01/2024  DOS: the patient was seen and examined on 07/01/2024  PCP: [name], MD   Patient coming from: Home  I have personally briefly reviewed patient's old medical records in [facility] and CareEverywhere  HPI:   Larry Kent is a 79 y.o. year old male with medical history of HTN, HLD, metastatic poorly differentiated carcinoma with primary lung vs prostate cancer. He is followed closely by oncology. He is on palliative chemotherapy and currently getting Eligard q4 months, oral zytiga, and IV docetaxel . He completed palliative radiation on 12/17. Pt's last session was on 12/19 and he received GCSF on 12/31. He is presenting to the ED with generalized weakness, right lower extremity pain along with chills. On arrival to the ED patient was noted to be HDS stable. Code sepsis was activated given his leukopenia. CBC without differential showed WBC 0.4, baseline anemia, and thrombocytopenia at 95k.  CMP shows mild hyponatremia, mild hyperglycemia, mild hypocalcemia and hypoproteinemia.  UA without signs of infection.  Lactic acid normal.  Blood cultures ordered and pending.  Respiratory panel negative for COVID, flu, RSV.  Patient reported some lower abdominal discomfort and CT abdomen pelvis was obtained that showed mild colonic wall thickening in the transverse and descending colon concerning for colitis.  Right airspace disease of the left lung concerning for atelectasis versus infiltrate.  Multiple metastatic lesions including the spine, pulmonary nodules and hepatic changes concerning for cirrhosis.  He also noted a new compression deformity at L2 concerning for a fracture.  Given patient's leukopenia along with chills and elevated temperature, TRH contacted for admission.  Review of Systems: As mentioned in the history of present illness. All other systems reviewed and are negative.   Past Medical  History:  Diagnosis Date   Allergy    Arthritis    both knees    BCC (basal cell carcinoma), arm, right 09/2019   MOHS ([place])   BCC (basal cell carcinoma), face 2014   L preauricular s/p MOHS   BCC (basal cell carcinoma), face 02/2018   L upper lip   BPH (benign prostatic hypertrophy)    Dr. Nieves @ [facility]   Carotid stenosis 05/2015   RICA 40-59%, LICA 1-39%, L vertebral occlusion, rpt 1 yr   Cataract    removed both eyes    Diabetes mellitus without complication (HCC)    FHx: colon cancer    FHx: prostate cancer    Heart murmur    mild aortic stenosis    History of kidney stones    Hyperlipidemia    borderline- on rosuvastatin  now normal    Lung cancer (HCC) 07/2022   Myocardial infarction (HCC) 09/07/2022   Occlusion of right vertebral artery 05/2015   Prostate cancer (HCC) 07/2022    Past Surgical History:  Procedure Laterality Date   BASAL CELL CARCINOMA EXCISION  05/2018   lip, 2020 x3  basal cells removed    CATARACT EXTRACTION  2008   Left   CATARACT EXTRACTION  2013   Right ([place])   COLONOSCOPY  ~2010   medium int hemorrhoids, o/w WNL, rpt 5 yrs given fmhx (Dr. Celestia)   COLONOSCOPY  08/2019   TAs, diverticulosis, rpt 3 yrs ([place])   COLONOSCOPY  12/2023   2TAs, diverticulosis, int hem, no f/u needed ([place])   CORONARY/GRAFT ACUTE MI REVASCULARIZATION N/A 09/07/2022   Procedure: Coronary/Graft Acute MI Revascularization;  Surgeon: Thukkani, Arun K, MD;  Location: [facility] INV[facility]IVE [facility] LAB;  Service: Cardiovascular;  Laterality: N/A;   ENDARTERECTOMY Right 12/01/2019   Procedure: RIGHT ENDARTERECTOMY CAROTID;  Surgeon: Gretta Lonni PARAS, MD;  Location[facility];  Service: Vascular;  Laterality: Right;   exercise treadmill  11/2005   WNL ([place])   FINE NEEDLE ASPIRATION  07/28/2022   Procedure: FINE NEEDLE ASPIRATION (FNA) LINEAR;  Surgeon: Brenna Adine CROME, DO;  Location: [facility] ENDOSCOPY;  Service: Pulmonary;;   HERNIA REPAIR  2008   Right    IR IMAGING GUIDED PORT INSERTION  08/11/2022   IR IMAGING GUIDED PORT INSERTION  10/08/2022   IR REMOVAL TUN ACCESS W/ PORT W/O FL MOD SED  09/10/2022   LEFT HEART CATH AND CORONARY ANGIOGRAPHY N/A 09/07/2022   Procedure: LEFT HEART CATH AND CORONARY ANGIOGRAPHY;  Surgeon: Wendel Lurena POUR, MD;  Location: [facility] INV[facility] LAB;  Service: Cardiovascular;  Laterality: N/A;   MOHS SURGERY Left spring 2014   basal cell face   PATCH ANGIOPLASTY Right 12/01/2019   Procedure: PATCH ANGIOPLASTY USING XENOSURE BIOLOGIC PATCH;  Surgeon: Gretta Lonni PARAS, MD;  Location: [facility] OR;  Service: Vascular;  Laterality: Right;   Testicular Biopsy  2003   benign, varicocele   US  ECHOCARDIOGRAPHY  11/2005   aortic sclerosis, EF 55-60%, diastolic dysfunction   VIDEO BRONCHOSCOPY WITH ENDOBRONCHIAL ULTRASOUND Bilateral 07/28/2022   Procedure: VIDEO BRONCHOSCOPY WITH ENDOBRONCHIAL ULTRASOUND;  Surgeon: Brenna Adine CROME, DO;  Location: [facility] ENDOSCOPY;  Service: Pulmonary;  Laterality: Bilateral;     Allergies[1]  Family History  Problem Relation Age of Onset   Cancer Father 67       prostate   Coronary artery disease Father 61       MI   Prostate cancer Father    Cancer Paternal Grandfather 28       prostate   Prostate cancer Paternal Grandfather    Stroke Mother    Hypertension Mother    Cancer Mother 35       colon   Colon cancer Mother    Coronary artery disease Maternal Uncle    Cancer Sister 77       breast   Breast cancer Sister    Colon cancer Maternal Grandmother    Diabetes Neg Hx    Colon polyps Neg Hx    Esophageal cancer Neg Hx    Rectal cancer Neg Hx    Stomach cancer Neg Hx     Prior to Admission medications  Medication Sig Start Date End Date Taking? Authorizing Provider  abiraterone acetate (ZYTIGA) 250 MG tablet Take 1,000 mg by mouth daily. 03/27/24   [provider]  Accu-Chek Softclix Lancets lancets SMARTSIG:Topical 06/29/23   [provider]  aspirin  EC 81  MG tablet Take 1 tablet (81 mg total) by mouth daily. Swallow whole. 08/12/23   Darron Deatrice LABOR, MD  Blood Glucose Monitoring Suppl DEVI 1 each by Does not apply route in the morning, at noon, and at bedtime. E11.69. May substitute to any manufacturer covered by patient's insurance. 06/29/23   [name], MD  Calcium  Carbonate-Vit D-Min (CALCIUM  600+D PLUS MINERALS) 600-400 MG-UNIT TABS Take 600 mg by mouth daily.    [provider]  Cholecalciferol (VITAMIN D3) 25 MCG (1000 UT) CAPS Take 1 capsule (1,000 Units total) by mouth daily. 01/26/24   [name], MD  CLINPRO 5000 1.1 % PSTE Place onto teeth. 05/29/24   [provider]  clopidogrel  (PLAVIX ) 75 MG tablet TAKE 1 TABLET BY MOUTH EVERY  DAY 03/13/24   Darron Deatrice LABOR, MD  dexamethasone  (DECADRON ) 4 MG tablet Take 2 tabs by mouth 2 times daily starting day before chemo. Then take 2 tabs daily for 2 days starting day after chemo. Take with food. 05/09/24   Sherrod Sherrod, MD  fluorouracil (EFUDEX) 5 % cream Apply topically 2 (two) times daily. 05/22/24   [provider]  fluticasone  (FLONASE ) 50 MCG/ACT nasal spray Place 1 spray into both nostrils daily as needed for allergies or rhinitis.    [provider]  HYDROcodone -acetaminophen  (NORCO/VICODIN) 5-325 MG tablet Take 1-2 tablets by mouth every 6 (six) hours as needed for moderate pain (pain score 4-6). 06/19/24   Wyatt Leeroy HERO, PA-C  Lancets Misc. (ACCU-CHEK SOFTCLIX LANCET DEV) KIT USE TO CHECK BLOOD SUGAR IN THE MORNING, AT NOON, AND AT BEDTIME 08/22/23   [provider]  lidocaine -prilocaine  (EMLA ) cream Apply to affected area once 05/09/24   Sherrod Sherrod, MD  Multiple Vitamin (MULTIVITAMIN) tablet Take 1 tablet by mouth daily with breakfast.    [provider]  omeprazole  (PRILOSEC) 20 MG capsule Take 20 mg by mouth as needed. 11/09/23   [provider]  ondansetron  (ZOFRAN ) 8 MG tablet Take 1 tablet (8 mg total)  by mouth every 8 (eight) hours as needed for nausea or vomiting. 05/09/24   Sherrod Sherrod, MD  potassium chloride  (KLOR-CON ) 10 MEQ tablet Take 1 tablet (10 mEq total) by mouth as needed. 06/09/23 06/26/24  Emelia Josefa HERO, NP  predniSONE  (DELTASONE ) 5 MG tablet Take 5 mg by mouth daily. 03/27/24   [provider]  predniSONE  (DELTASONE ) 5 MG tablet Take 1 tablet (5 mg total) by mouth in the morning and at bedtime. 05/09/24   Sherrod Sherrod, MD  prochlorperazine  (COMPAZINE ) 10 MG tablet Take 1 tablet (10 mg total) by mouth every 6 (six) hours as needed for nausea or vomiting. 05/09/24   Sherrod Sherrod, MD  rosuvastatin  (CRESTOR ) 5 MG tablet TAKE 1 TABLET (5 MG TOTAL) BY MOUTH DAILY. 02/07/24   Darron Deatrice LABOR, MD  tamsulosin  (FLOMAX ) 0.4 MG CAPS capsule Take 0.4 mg by mouth 2 (two) times daily.    [provider]    Social History:  reports that he has never smoked. He has never used smokeless tobacco. He reports that he does not currently use alcohol . He reports that he does not use drugs.    Physical Exam: Vitals:   07/01/24 1258 07/01/24 1530 07/01/24 1600 07/01/24 1830  BP:  122/62  (!) 104/55  Pulse:  88  93  Resp:  19  (!) 23  Temp:   100.1 F (37.8 C)   TempSrc:   Rectal   SpO2:  97%  92%  Weight: 86.6 kg     Height: 5' 3 (1.6 m)       Gen: NAD HENT: few white spots noted in oropharynx.  CV: normal heart sounds Lung: CTAB Abd: No TTP, normal bowel sounds MSK: No asymmetry, good bulk and tone Neuro: alert and oriented   Labs on Admission: I have personally reviewed following labs and imaging studies  CBC: Recent Labs  Lab 06/26/24 1102 07/01/24 1300  WBC 6.5 0.4*  NEUTROABS 5.8  --   HGB 11.0* 12.1*  HCT 31.4* 35.5*  MCV 99.7 101.7*  PLT 157 95*   Basic Metabolic Panel: Recent Labs  Lab 06/26/24 1102 07/01/24 1300  NA 139 128*  K 3.9 4.1  CL 104 95*  CO2 22 22  GLUCOSE 181*  121*  BUN 20 22  CREATININE 0.81 0.78  CALCIUM   9.2 8.5*   GFR: Estimated Creatinine Clearance: 74.1 mL/min (by C-G formula based on SCr of 0.78 mg/dL). Liver Function Tests: Recent Labs  Lab 06/26/24 1102 07/01/24 1300  AST 33 30  ALT 25 22  ALKPHOS 231* 174*  BILITOT 0.6 1.2  PROT 6.7 6.0*  ALBUMIN 4.0 3.7   No results for input(s): LIPASE, AMYLASE in the last 168 hours. No results for input(s): AMMONIA in the last 168 hours. Coagulation Profile: Recent Labs  Lab 07/01/24 1444  INR 1.1   Cardiac Enzymes: No results for input(s): CKTOTAL, CKMB, CKMBINDEX, TROPONINI, TROPONINIHS in the last 168 hours. BNP (last 3 results) No results for input(s): BNP in the last 8760 hours. HbA1C: No results for input(s): HGBA1C in the last 72 hours. CBG: Recent Labs  Lab 07/01/24 1310  GLUCAP 117*   Lipid Profile: No results for input(s): CHOL, HDL, LDLCALC, TRIG, CHOLHDL, LDLDIRECT in the last 72 hours. Thyroid  Function Tests: No results for input(s): TSH, T4TOTAL, FREET4, T3FREE, THYROIDAB in the last 72 hours. Anemia Panel: No results for input(s): VITAMINB12, FOLATE, FERRITIN, TIBC, IRON, RETICCTPCT in the last 72 hours. Urine analysis:    Component Value Date/Time   COLORURINE YELLOW (A) 07/01/2024 1300   APPEARANCEUR CLEAR (A) 07/01/2024 1300   LABSPEC 1.024 07/01/2024 1300   PHURINE 6.0 07/01/2024 1300   GLUCOSEU NEGATIVE 07/01/2024 1300   HGBUR NEGATIVE 07/01/2024 1300   BILIRUBINUR NEGATIVE 07/01/2024 1300   BILIRUBINUR negative 06/29/2023 1256   KETONESUR NEGATIVE 07/01/2024 1300   PROTEINUR 30 (A) 07/01/2024 1300   UROBILINOGEN 0.2 06/29/2023 1256   NITRITE NEGATIVE 07/01/2024 1300   LEUKOCYTESUR NEGATIVE 07/01/2024 1300    Radiological Exams on Admission: I have personally reviewed images CT ABDOMEN PELVIS W CONTRAST Result Date: 07/01/2024 CLINICAL DATA:  Lower abdominal pain, neutropenic fever. Patient on chemotherapy for prostate cancer with lung  mass. EXAM: CT ABDOMEN AND PELVIS WITH CONTRAST TECHNIQUE: Multidetector CT imaging of the abdomen and pelvis was performed using the standard protocol following bolus administration of intravenous contrast. RADIATION DOSE REDUCTION: This exam was performed according to the departmental dose-optimization program which includes automated exposure control, adjustment of the mA and/or kV according to patient size and/or use of iterative reconstruction technique. CONTRAST:  OMNIPAQUE  IOHEXOL  300 MG/ML  SOLN COMPARISON:  04/25/2024, 04/11/2024. FINDINGS: Lower chest: The heart is mildly enlarged and multi-vessel coronary artery calcifications are noted. There is a small left pleural effusion with a associated atelectasis or infiltrate. Multiple nodules are identified at the right lung base, increased in number from the prior exam. The left lung base is obscured due to associated infiltrates. Hepatobiliary: No focal abnormality in the liver. The liver has a nodular contour suggesting underlying cirrhosis. Fatty infiltration is also seen. No biliary ductal dilatation. The gallbladder is without stones. Pancreas: Unremarkable. No pancreatic ductal dilatation or surrounding inflammatory changes. Spleen: Normal in size without focal abnormality. Adrenals/Urinary Tract: The adrenal glands are within normal limits. The kidneys enhance symmetrically. A stable hypodensity is noted in the left kidney, suggesting cyst. No renal calculus or obstructive uropathy bilaterally. The bladder is stable. Stomach/Bowel: The stomach is within normal limits. No bowel obstruction, free air, or pneumatosis is seen. A moderate amount of retained stool is present in the colon. There is mild thickening of the walls of the transverse and descending colon. Appendix appears normal. Vascular/Lymphatic: Aortic atherosclerosis. No enlarged abdominal or pelvic lymph nodes. Reproductive: Prostate is  unremarkable. Other: Rim calcified structures are  seen in the pelvis suggesting fat necrosis. There is trace ascites in the right pericolic gutter. Musculoskeletal: Degenerative changes are present in the thoracolumbar spine. Sclerotic changes are noted in the visualized thoracolumbar spine, sacrum, and pelvis. A lytic lesion with bony discontinuity is noted in the inferior pubic ramus on the left with likely associated soft tissue component. There is a new compression deformity in the superior endplate at L2 with loss of vertebral body height of 10% centrally, likely pathologic fracture. IMPRESSION: 1. Mild colonic wall thickening involving the transverse and descending colon, possible colitis. 2. Increased airspace disease at the left lung base, possible atelectasis or infiltrate. 3. Small left pleural effusion. 4. Osteoblastic metastasis in the thoracolumbar spine, sacrum, and pelvis. A new compression deformity in the superior endplate at L2, likely pathologic fracture. 5. Increased number of pulmonary nodules, suspicious for metastatic disease. 6. Hepatic steatosis with morphologic changes in the liver suggestive of cirrhosis. 7. Aortic atherosclerosis and coronary artery calcifications. Electronically Signed   By: Leita Birmingham M.D.   On: 07/01/2024 16:53   DG Chest Port 1 View Result Date: 07/01/2024 CLINICAL DATA:  Fever, chemotherapy patient. EXAM: PORTABLE CHEST 1 VIEW COMPARISON:  11/02/2022, 04/11/2024. FINDINGS: The heart is borderline enlarged and the mediastinal contour is within normal limits. There is atherosclerotic calcification of the aorta. Interstitial prominence is noted bilaterally. A few scattered nodular densities are seen bilaterally. There are suspected trace bilateral pleural effusions. No pneumothorax is seen. A left chest port terminates in the right atrium. IMPRESSION: 1. Interstitial prominence bilaterally with scattered nodular opacities, unchanged from the previous exam. 2. Suspected small bilateral pleural effusions.  Electronically Signed   By: Leita Birmingham M.D.   On: 07/01/2024 15:24    EKG: My personal interpretation of EKG shows: Normal sinus rhythm acute ST changes  Assessment/Plan Principal Problem:   Severe neutropenia Active Problems:   Hyperlipidemia with target LDL less than 70   Malignant neoplasm of unspecified part of unspecified bronchus or lung (HCC)   Prostate cancer metastatic to multiple sites (HCC)   Thrombocytopenia   Cirrhosis of liver (HCC)   Type 2 diabetes mellitus with other specified complication (HCC)   Essential hypertension   GERD (gastroesophageal reflux disease)   Patient with metastatic cancer having severe neutropenia along with reported chills and weakness admitted for severe neutropenia.  Patient is afebrile.  Imaging showed mild colonic wall thickening concerning for colitis the patient with any GI symptoms.  This may be a side effect of chemotherapy resulting in slight mucositis.  Patient without any other signs or symptoms of infection so we will hold off continuing antibiotics.  Given no source of infection, patient does not meet sepsis criteria.  Blood cultures have been ordered and will follow-up on them.  Given patient receives chemotherapy, will consult oncology regarding his neutropenia.  Oral candidiasis: Mild. States he gets this after getting his chemotherapy.  No report of dysphagia or odynophagia.  Nystatin  oral solution ordered.  Thrombocytopenia: Smear and IPF ordered.  Likely mediated by chemotherapy.  Hyponatremia: Likely secondary to poor oral intake given his report.  Patient is status post multiple liters of IVF.  Will continue to monitor his sodium.  L2 compression fracture: Noted on CT abdomen pelvis.  Neurosurgery consulted and will evaluate patient.  Will get MRI.  Chronic Problems: Restart home meds once med reconciliation is completed.   Hyperlipidemia: Continue home dose statin.  Type 2 diabetes: Start on SSI.  GERD: Continue home  PPI  Cirrhosis of the liver: This is being evaluated by GI in the outpatient setting.  Currently at baseline.  Defer management to outpatient setting.  VTE prophylaxis:  SQ Heparin   Diet: Heart healthy Code Status:  Full Code Telemetry:  Admission status: Observation, Med-Surg Patient is from: Home Anticipated d/c is to: Home Anticipated d/c is in: 1-2 days   Family Communication: Updated at bedside  Consults called: Oncology   Severity of Illness: The appropriate patient status for this patient is OBSERVATION. Observation status is judged to be reasonable and necessary in order to provide the required intensity of service to ensure the patient's safety. The patient's presenting symptoms, physical exam findings, and initial radiographic and laboratory data in the context of their medical condition is felt to place them at decreased risk for further clinical deterioration. Furthermore, it is anticipated that the patient will be medically stable for discharge from the hospital within 2 midnights of admission.    Morene Bathe, MD [facility]. [facility]      [1] No Known Allergies  "

## 2024-07-01 NOTE — Sepsis Progress Note (Signed)
 Sepsis protocol is being followed by [facility].

## 2024-07-01 NOTE — ED Notes (Signed)
 Pt endorsing discomfort in bed. This RN provided recliner chair and comfortably positioned pt.

## 2024-07-01 NOTE — Progress Notes (Signed)
 Pharmacy Antibiotic Note  Larry Kent is a 79 y.o. male admitted on 07/01/2024 with neutropenia .  Pharmacy has been consulted for Vanc, cefepime  dosing.  Plan: Cefepime  2 gm IV X 1 given in ED on 1/3 @ 1400. Cefepime  2 gm IV Q8H ordered to start on 1/3 @ 2230.  Vancomycin  1 gm IV X 1 given in ED on 1/3 @ 1640. Additional Vanc 1 gm IV X 1 ordered to make total loading dose of 2 gm. Vancomycin  1750 mg IV Q24H ordered to start on 1/4 @ 1700.   AUC = 508.1 Vanc trough = 11.3   Height: 5' 3 (160 cm) Weight: 86.6 kg (190 lb 14.7 oz) IBW/kg (Calculated) : 56.9  Temp (24hrs), Avg:99.8 F (37.7 C), Min:99.2 F (37.3 C), Max:100.2 F (37.9 C)  Recent Labs  Lab 06/26/24 1102 07/01/24 1300  WBC 6.5 0.4*  CREATININE 0.81 0.78  LATICACIDVEN  --  1.5    Estimated Creatinine Clearance: 74.1 mL/min (by C-G formula based on SCr of 0.78 mg/dL).    Allergies[1]  Antimicrobials this admission:   >>    >>   Dose adjustments this admission:   Microbiology results:  BCx:   UCx:    Sputum:    MRSA PCR:   Thank you for allowing pharmacy to be a part of this patients care.  Jarnell Cordaro D 07/01/2024 10:39 PM     [1] No Known Allergies

## 2024-07-02 ENCOUNTER — Observation Stay

## 2024-07-02 DIAGNOSIS — R5081 Fever presenting with conditions classified elsewhere: Secondary | ICD-10-CM

## 2024-07-02 DIAGNOSIS — D709 Neutropenia, unspecified: Secondary | ICD-10-CM | POA: Diagnosis not present

## 2024-07-02 DIAGNOSIS — S32020A Wedge compression fracture of second lumbar vertebra, initial encounter for closed fracture: Secondary | ICD-10-CM | POA: Diagnosis not present

## 2024-07-02 DIAGNOSIS — C61 Malignant neoplasm of prostate: Secondary | ICD-10-CM | POA: Diagnosis not present

## 2024-07-02 LAB — CBC WITH DIFFERENTIAL/PLATELET
Abs Immature Granulocytes: 0 K/uL (ref 0.00–0.07)
Basophils Absolute: 0 K/uL (ref 0.0–0.1)
Basophils Relative: 2 %
Eosinophils Absolute: 0.1 K/uL (ref 0.0–0.5)
Eosinophils Relative: 8 %
HCT: 30.2 % — ABNORMAL LOW (ref 39.0–52.0)
HCT: 30.7 % — ABNORMAL LOW (ref 39.0–52.0)
Hemoglobin: 10.6 g/dL — ABNORMAL LOW (ref 13.0–17.0)
Hemoglobin: 10.7 g/dL — ABNORMAL LOW (ref 13.0–17.0)
Immature Granulocytes: 0 %
Lymphocytes Relative: 46 %
Lymphs Abs: 0.3 K/uL — ABNORMAL LOW (ref 0.7–4.0)
MCH: 35 pg — ABNORMAL HIGH (ref 26.0–34.0)
MCH: 35.1 pg — ABNORMAL HIGH (ref 26.0–34.0)
MCHC: 34.9 g/dL (ref 30.0–36.0)
MCHC: 35.1 g/dL (ref 30.0–36.0)
MCV: 100 fL (ref 80.0–100.0)
MCV: 100.3 fL — ABNORMAL HIGH (ref 80.0–100.0)
Monocytes Absolute: 0.1 K/uL (ref 0.1–1.0)
Monocytes Relative: 21 %
Neutro Abs: 0.1 K/uL — CL (ref 1.7–7.7)
Neutrophils Relative %: 23 %
Platelets: 70 K/uL — ABNORMAL LOW (ref 150–400)
Platelets: 72 K/uL — ABNORMAL LOW (ref 150–400)
RBC: 3.02 MIL/uL — ABNORMAL LOW (ref 4.22–5.81)
RBC: 3.06 MIL/uL — ABNORMAL LOW (ref 4.22–5.81)
RDW: 13.3 % (ref 11.5–15.5)
RDW: 13.6 % (ref 11.5–15.5)
WBC: 0.6 K/uL — CL (ref 4.0–10.5)
WBC: 0.6 K/uL — CL (ref 4.0–10.5)
nRBC: 0 % (ref 0.0–0.2)
nRBC: 0 % (ref 0.0–0.2)

## 2024-07-02 LAB — CBC
HCT: 28.7 % — ABNORMAL LOW (ref 39.0–52.0)
Hemoglobin: 10 g/dL — ABNORMAL LOW (ref 13.0–17.0)
MCH: 34.8 pg — ABNORMAL HIGH (ref 26.0–34.0)
MCHC: 34.8 g/dL (ref 30.0–36.0)
MCV: 100 fL (ref 80.0–100.0)
Platelets: 67 K/uL — ABNORMAL LOW (ref 150–400)
RBC: 2.87 MIL/uL — ABNORMAL LOW (ref 4.22–5.81)
RDW: 13.3 % (ref 11.5–15.5)
WBC: 0.6 K/uL — CL (ref 4.0–10.5)
nRBC: 0 % (ref 0.0–0.2)

## 2024-07-02 LAB — VITAMIN D 25 HYDROXY (VIT D DEFICIENCY, FRACTURES): Vit D, 25-Hydroxy: 21.2 ng/mL — ABNORMAL LOW (ref 30–100)

## 2024-07-02 LAB — BASIC METABOLIC PANEL WITH GFR
Anion gap: 6 (ref 5–15)
BUN: 15 mg/dL (ref 8–23)
CO2: 25 mmol/L (ref 22–32)
Calcium: 8.3 mg/dL — ABNORMAL LOW (ref 8.9–10.3)
Chloride: 102 mmol/L (ref 98–111)
Creatinine, Ser: 0.71 mg/dL (ref 0.61–1.24)
GFR, Estimated: >60 mL/min
Glucose, Bld: 118 mg/dL — ABNORMAL HIGH (ref 70–99)
Potassium: 3.7 mmol/L (ref 3.5–5.1)
Sodium: 134 mmol/L — ABNORMAL LOW (ref 135–145)

## 2024-07-02 LAB — GLUCOSE, CAPILLARY
Glucose-Capillary: 127 mg/dL — ABNORMAL HIGH (ref 70–99)
Glucose-Capillary: 114 mg/dL — ABNORMAL HIGH (ref 70–99)
Glucose-Capillary: 128 mg/dL — ABNORMAL HIGH (ref 70–99)
Glucose-Capillary: 141 mg/dL — ABNORMAL HIGH (ref 70–99)

## 2024-07-02 MED ORDER — CHLORHEXIDINE GLUCONATE CLOTH 2 % EX PADS
6.0000 | MEDICATED_PAD | Freq: Every day | CUTANEOUS | Status: DC
  Administered 2024-07-02: 6 via TOPICAL

## 2024-07-02 MED ORDER — TRAZODONE HCL 50 MG PO TABS
50.0000 mg | ORAL_TABLET | Freq: Every evening | ORAL | Status: DC | PRN
  Administered 2024-07-02: 50 mg via ORAL
  Filled 2024-07-02: qty 1

## 2024-07-02 MED ORDER — TAMSULOSIN HCL 0.4 MG PO CAPS
0.4000 mg | ORAL_CAPSULE | Freq: Two times a day (BID) | ORAL | Status: DC
  Administered 2024-07-02 – 2024-07-03 (×3): 0.4 mg via ORAL
  Filled 2024-07-02 (×3): qty 1

## 2024-07-02 MED ORDER — ASPIRIN 81 MG PO TBEC
81.0000 mg | DELAYED_RELEASE_TABLET | Freq: Every day | ORAL | Status: DC
  Administered 2024-07-02 – 2024-07-03 (×2): 81 mg via ORAL
  Filled 2024-07-02 (×2): qty 1

## 2024-07-02 MED ORDER — ZINC OXIDE 40 % EX OINT
1.0000 | TOPICAL_OINTMENT | Freq: Every day | CUTANEOUS | Status: DC
  Administered 2024-07-02: 1 via TOPICAL
  Filled 2024-07-02: qty 113

## 2024-07-02 MED ORDER — VANCOMYCIN HCL 1250 MG/250ML IV SOLN
1250.0000 mg | INTRAVENOUS | Status: DC
  Administered 2024-07-02: 1250 mg via INTRAVENOUS
  Filled 2024-07-02: qty 250

## 2024-07-02 MED ORDER — CLOPIDOGREL BISULFATE 75 MG PO TABS
75.0000 mg | ORAL_TABLET | Freq: Every day | ORAL | Status: DC
  Administered 2024-07-02 – 2024-07-03 (×2): 75 mg via ORAL
  Filled 2024-07-02 (×2): qty 1

## 2024-07-02 MED ORDER — LORATADINE 10 MG PO TABS
10.0000 mg | ORAL_TABLET | Freq: Every day | ORAL | Status: DC
  Administered 2024-07-02 – 2024-07-03 (×2): 10 mg via ORAL
  Filled 2024-07-02 (×2): qty 1

## 2024-07-02 MED ORDER — METHOCARBAMOL 500 MG PO TABS
500.0000 mg | ORAL_TABLET | Freq: Four times a day (QID) | ORAL | Status: DC | PRN
  Administered 2024-07-02 – 2024-07-03 (×3): 500 mg via ORAL
  Filled 2024-07-02 (×3): qty 1

## 2024-07-02 NOTE — Progress Notes (Signed)
 Mobility Specialist Progress Note:    07/02/24 1708  Mobility  Activity Ambulated with assistance  Level of Assistance Standby assist, set-up cues, supervision of patient - no hands on  Assistive Device Other (Comment) (IV pole)  Distance Ambulated (ft) 60 ft  Range of Motion/Exercises Active;All extremities  Activity Response Tolerated well  Mobility visit 1 Mobility  Mobility Specialist Start Time (ACUTE ONLY) 1645  Mobility Specialist Stop Time (ACUTE ONLY) 1700  Mobility Specialist Time Calculation (min) (ACUTE ONLY) 15 min   Pt received in bed, agreeable to mobility. Required SBA to stand and ambulate utilizing IV pole for stability. Tolerated well, pt c/o feeling stiff. Returned pt fowlers, all needs met.  Sherrilee Ditty Mobility Specialist Please contact via Special Educational Needs Teacher or  Rehab office at [phone]

## 2024-07-02 NOTE — Evaluation (Signed)
 Occupational Therapy Evaluation Patient Details Name: Larry Kent MRN: [id number] DOB: 1945-11-26 Today's Date: 07/02/2024   History of Present Illness   Pt is a 79 y.o. year old male presenting to the ED with generalized weakness, RLE pain along with chills. MRI shows multifocal osseous metastases involving the lumbar spine as well as the visualized sacrum and pelvis, associated pathologic compression fractures involving the inferior endplate of T12 and superior endplates of L1 and L2. Associated height loss of up to 40% without retropulsion. 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     Clinical Impressions Pt was seen for OT evaluation this date. PTA, pt resides in a 2 level home with his wife and adult son. Pt is typically IND with all ADLs and mobility, driving to his appts, etc. He reports difficulty with ambulation since Oct/Nov 2025 when tumor was pressing on his sciatic nerve. He was able to perform bed mobility, transfers, toileting tasks and mobility during session with intermittent unilateral support pushing IV pole with no LOB and supervision. He reports minimal 1/10 pain now that he is up and moving, reports it is worse when he is laying in bed. Nurse in to provide pain meds. He appears at his baseline with no functional decline, will sign off in house.      If plan is discharge home, recommend the following:   Assist for transportation;Help with stairs or ramp for entrance;Assistance with cooking/housework     Functional Status Assessment   Patient has not had a recent decline in their functional status     Equipment Recommendations   None recommended by OT     Recommendations for Other Services         Precautions/Restrictions         Mobility Bed Mobility Overal bed mobility: Needs Assistance Bed Mobility: Sidelying to Sit   Sidelying to sit: Modified independent  (Device/Increase time)       General bed mobility comments: pt already in sidelying upon entry    Transfers Overall transfer level: Modified independent                 General transfer comment: able to stand from EOB with supervision and ambulate to bathroom and back pushing IV pole with no LOB, forward flexed posture noted-reports this has been the case since OCT/NOV 2025      Balance Overall balance assessment: Needs assistance Sitting-balance support: Feet supported Sitting balance-Leahy Scale: Normal     Standing balance support: Single extremity supported, During functional activity Standing balance-Leahy Scale: Good                             ADL either performed or assessed with clinical judgement   ADL Overall ADL's : Needs assistance/impaired     Grooming: Wash/dry hands;Standing;Modified independent;Supervision/safety               Lower Body Dressing: Supervision/safety;Sitting/lateral leans Lower Body Dressing Details (indicate cue type and reason): donn bil shoes Toilet Transfer: Supervision/safety;Ambulation   Toileting- Clothing Manipulation and Hygiene: Supervision/safety;Sit to/from stand       Functional mobility during ADLs: Supervision/safety;Modified independent General ADL Comments: pushed IV pole in the room for ADL transfer ~30 ft     Vision         Perception         Praxis  Pertinent Vitals/Pain Pain Assessment Pain Assessment: 0-10 Pain Score: 1  Pain Location: BLEs/thighs Pain Intervention(s): Limited activity within patient's tolerance, Monitored during session, Repositioned     Extremity/Trunk Assessment Upper Extremity Assessment Upper Extremity Assessment: Overall WFL for tasks assessed   Lower Extremity Assessment Lower Extremity Assessment: Generalized weakness   Cervical / Trunk Assessment Cervical / Trunk Assessment: Kyphotic   Communication Communication Communication: No  apparent difficulties   Cognition Arousal: Alert Behavior During Therapy: WFL for tasks assessed/performed Cognition: No apparent impairments                               Following commands: Intact       Cueing  General Comments          Exercises     Shoulder Instructions      Home Living Family/patient expects to be discharged to:: Private residence Living Arrangements:  (adult son lives there too but works) Available Help at Discharge: Family;Available 24 hours/day Type of Home: House Home Access: Level entry     Home Layout: Two level;Bed/bath upstairs Alternate Level Stairs-Number of Steps: flight Alternate Level Stairs-Rails: Right Bathroom Shower/Tub: Producer, Television/film/video: Standard     Home Equipment: None          Prior Functioning/Environment Prior Level of Function : Independent/Modified Independent;Driving             Mobility Comments: indep ADLs Comments: indep    OT Problem List:     OT Treatment/Interventions:        OT Goals(Current goals can be found in the care plan section)       OT Frequency:       Co-evaluation              AM-PAC OT 6 Clicks Daily Activity     Outcome Measure Help from another person eating meals?: None Help from another person taking care of personal grooming?: None Help from another person toileting, which includes using toliet, bedpan, or urinal?: None Help from another person bathing (including washing, rinsing, drying)?: None Help from another person to put on and taking off regular upper body clothing?: None Help from another person to put on and taking off regular lower body clothing?: None 6 Click Score: 24   End of Session Nurse Communication: Mobility status  Activity Tolerance: Patient tolerated treatment well Patient left: in bed;with call bell/phone within reach;with nursing/sitter in room  OT Visit Diagnosis: Pain Pain - part of body: Hip                 Time: 9147-9074 OT Time Calculation (min): 33 min Charges:  OT General Charges $OT Visit: 1 Visit OT Evaluation $OT Eval Low Complexity: 1 Low OT Treatments $Self Care/Home Management : 8-22 mins Larry Kent, Larry Kent 07/02/2024, 9:46 AM  Larry Kent 07/02/2024, 9:43 AM

## 2024-07-02 NOTE — Consult Note (Signed)
 "  Hematology/Oncology Consult note [facility] [phone] [phone]  Patient Care Team: [name], MD as PCP - General (Family Medicine) Sherrod Sherrod, MD as Consulting Physician (Oncology) Fleeta Rothman, Jomarie SAILOR, MD as Consulting Physician (Infectious Diseases) Gretta Lonni PARAS, MD as Consulting Physician (Vascular Surgery) [facility], [facility] Darron Deatrice LABOR, MD as Consulting Physician (Cardiology) [facility], [facility])   Name of the patient: Larry Kent  [id number]  Mar 09, 1946    Reason for consult: Neutropenic fever   Requesting physician: Dr. Morene Bathe  Date of visit: 07/02/2024    History of presenting illness- Patient is a 79 year old male diagnosed with prostatic adenocarcinoma in February 2024.  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  He received 5 cycles of carbo etoposide  durvalumab  chemotherapy starting in February 2024 and maintenance Imfinzi  every 4 weeks.  Imfinzi  is presently on hold.    For his prostate cancer he is on Eligard every 4 months with Zytiga along with docetaxel .  Patient was last seen by Dr. Sherrod in [place] on 05/29/2024.  He received docetaxel  75 mg/m on12/30/2025 with Neulasta  support.  Patient was admitted to the hospital with generalized weakness and right lower extremity pain.  He is being treated for possible pneumonia.  Imaging studies revealed possible L2 fracture.  Hematology consulted for neutropenia white cell count today is 0.6 with an H&H of 10/28.7 with an MCV of 100 and a platelet count of 67.  Procalcitonin mildly elevated at 0.23   ECOG PS- 2  Pain scale- 4   Review of systems- Review of Systems  Constitutional:  Positive for malaise/fatigue. Negative for chills, fever and weight loss.  HENT:  Negative  for congestion, ear discharge and nosebleeds.   Eyes:  Negative for blurred vision.  Respiratory:  Negative for cough, hemoptysis, sputum production, shortness of breath and wheezing.   Cardiovascular:  Negative for chest pain, palpitations, orthopnea and claudication.  Gastrointestinal:  Negative for abdominal pain, blood in stool, constipation, diarrhea, heartburn, melena, nausea and vomiting.  Genitourinary:  Negative for dysuria, flank pain, frequency, hematuria and urgency.  Musculoskeletal:  Positive for back pain. Negative for joint pain and myalgias.  Skin:  Negative for rash.  Neurological:  Negative for dizziness, tingling, focal weakness, seizures, weakness and headaches.  Endo/Heme/Allergies:  Does not bruise/bleed easily.  Psychiatric/Behavioral:  Negative for depression and suicidal ideas. The patient does not have insomnia.     Allergies[1]  Patient Active Problem List   Diagnosis Date Noted   Essential hypertension 07/01/2024   GERD (gastroesophageal reflux disease) 07/01/2024   Severe neutropenia 07/01/2024   Vitamin D  deficiency 01/26/2024   Urge urinary incontinence 07/01/2023   Type 2 diabetes mellitus with other specified complication (HCC) 06/29/2023   Encounter for antineoplastic immunotherapy 03/23/2023   Cirrhosis of liver (HCC) 03/23/2023   Encounter for antineoplastic chemotherapy 09/28/2022   Catheter-associated urinary tract infection 09/11/2022   Enterococcal bacteremia 09/10/2022   S/P coronary artery stent placement 09/10/2022   Port or reservoir infection 09/10/2022   Coronary artery disease involving native coronary artery of native heart with unstable angina pectoris (HCC) 09/08/2022   Left main coronary artery disease 09/08/2022   Hematuria 09/08/2022   Acute ST elevation myocardial infarction (STEMI) of inferior wall (HCC) 09/07/2022   Thrombocytopenia 08/21/2022   Prostate cancer metastatic to multiple sites (HCC) 08/20/2022   Pancytopenia due  to antineoplastic chemotherapy 08/18/2022  Bilateral hydronephrosis 08/18/2022   Indwelling urinary catheter present 08/11/2022   Malignant neoplasm of unspecified part of unspecified bronchus or lung (HCC) 08/11/2022   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   Lung nodule 07/16/2022   Sensorineural hearing loss (SNHL) of left ear with restricted hearing of right ear 02/22/2020   Cerebrovascular disease 02/22/2020   Cervical radiculopathy 02/08/2018   Aortic stenosis 07/01/2016   Nonspecific abnormal electrocardiogram (ECG) (EKG) 07/01/2016   Overweight with body mass index (BMI) 25.0-29.9 06/10/2015   Carotid stenosis 05/30/2015   Occlusion of left vertebral artery 05/30/2015   Advanced care planning/counseling discussion 05/23/2014   Osteoarthritis of right knee 05/23/2014   Health maintenance examination 05/23/2014   Medicare annual wellness visit, subsequent 02/12/2012   Hyperlipidemia with target LDL less than 70 02/12/2012   FHx: prostate cancer    FHx: colon cancer      Past Medical History:  Diagnosis Date   Allergy    Arthritis    both knees    BCC (basal cell carcinoma), arm, right 09/2019   MOHS ([place])   BCC (basal cell carcinoma), face 2014   L preauricular s/p MOHS   BCC (basal cell carcinoma), face 02/2018   L upper lip   BPH (benign prostatic hypertrophy)    Dr. Nieves @ [facility]   Carotid stenosis 05/2015   RICA 40-59%, LICA 1-39%, L vertebral occlusion, rpt 1 yr   Cataract    removed both eyes    Diabetes mellitus without complication (HCC)    FHx: colon cancer    FHx: prostate cancer    Heart murmur    mild aortic stenosis    History of kidney stones    Hyperlipidemia    borderline- on rosuvastatin  now normal    Lung cancer (HCC) 07/2022   Myocardial infarction (HCC) 09/07/2022   Occlusion of right vertebral artery 05/2015   Prostate cancer (HCC) 07/2022     Past Surgical History:  Procedure Laterality Date   BASAL CELL  CARCINOMA EXCISION  05/2018   lip, 2020 x3  basal cells removed    CATARACT EXTRACTION  2008   Left   CATARACT EXTRACTION  2013   Right ([place])   COLONOSCOPY  ~2010   medium int hemorrhoids, o/w WNL, rpt 5 yrs given fmhx (Dr. Celestia)   COLONOSCOPY  08/2019   TAs, diverticulosis, rpt 3 yrs (Armbruster)   COLONOSCOPY  12/2023   2TAs, diverticulosis, int hem, no f/u needed (Armbruster)   CORONARY/GRAFT ACUTE MI REVASCULARIZATION N/A 09/07/2022   Procedure: Coronary/Graft Acute MI Revascularization;  Surgeon: Thukkani, Arun K, MD;  Location: [facility] INVASIVE [facility] LAB;  Service: Cardiovascular;  Laterality: N/A;   ENDARTERECTOMY Right 12/01/2019   Procedure: RIGHT ENDARTERECTOMY CAROTID;  Surgeon: Gretta Lonni PARAS, MD;  Location: [facility];  Service: Vascular;  Laterality: Right;   exercise treadmill  11/2005   WNL ([place])   FINE NEEDLE ASPIRATION  07/28/2022   Procedure: FINE NEEDLE ASPIRATION (FNA) LINEAR;  Surgeon: Brenna Adine CROME, DO;  Location: [facility] ENDOSCOPY;  Service: Pulmonary;;   HERNIA REPAIR  2008   Right   IR IMAGING GUIDED PORT INSERTION  08/11/2022   IR IMAGING GUIDED PORT INSERTION  10/08/2022   IR REMOVAL TUN ACCESS W/ PORT W/O FL MOD SED  09/10/2022   LEFT HEART CATH AND CORONARY ANGIOGRAPHY N/A 09/07/2022   Procedure: LEFT HEART CATH AND CORONARY ANGIOGRAPHY;  Surgeon: Wendel Lurena POUR, MD;  Location: [facility];  Service: Cardiovascular;  Laterality: N/A;  MOHS SURGERY Left spring 2014   basal cell face   PATCH ANGIOPLASTY Right 12/01/2019   Procedure: PATCH ANGIOPLASTY USING GEORGE BIOLOGIC PATCH;  Surgeon: Gretta Lonni PARAS, MD;  Location: [facility] OR;  Service: Vascular;  Laterality: Right;   Testicular Biopsy  2003   benign, varicocele   US  ECHOCARDIOGRAPHY  11/2005   aortic sclerosis, EF 55-60%, diastolic dysfunction   VIDEO BRONCHOSCOPY WITH ENDOBRONCHIAL ULTRASOUND Bilateral 07/28/2022   Procedure: VIDEO BRONCHOSCOPY WITH ENDOBRONCHIAL ULTRASOUND;   Surgeon: Brenna Adine CROME, DO;  Location: [facility] ENDOSCOPY;  Service: Pulmonary;  Laterality: Bilateral;    Social History   Socioeconomic History   Marital status: Married    Spouse name: Not on file   Number of children: Not on file   Years of education: Not on file   Highest education level: Not on file  Occupational History   Occupation: retired  Tobacco Use   Smoking status: Never   Smokeless tobacco: Never  Vaping Use   Vaping status: Never Used  Substance and Sexual Activity   Alcohol  use: Not Currently    Comment: rare   Drug use: No   Sexual activity: Not on file  Other Topics Concern   Not on file  Social History Narrative   Caffeine: 2-3 cups coffee/day   Lives with wife and adult son, 1 dog   Occupation: warden/ranger   Edu: college   Activity: golf, no regular exercise, occasionally walks with wife   Diet: good amt water, daily fruits/vegetables, fish several times a week   Social Drivers of Health   Tobacco Use: Low Risk (07/01/2024)   Patient History    Smoking Tobacco Use: Never    Smokeless Tobacco Use: Never    Passive Exposure: Not on file  Financial Resource Strain: Low Risk (10/07/2023)   Overall Financial Resource Strain (CARDIA)    Difficulty of Paying Living Expenses: Not hard at all  Food Insecurity: No Food Insecurity (07/01/2024)   Epic    Worried About Radiation Protection Practitioner of Food in the Last Year: Never true    Ran Out of Food in the Last Year: Never true  Transportation Needs: No Transportation Needs (07/01/2024)   Epic    Lack of Transportation (Medical): No    Lack of Transportation (Non-Medical): No  Recent Concern: Transportation Needs - Unmet Transportation Needs (05/12/2024)   Epic    Lack of Transportation (Medical): Yes    Lack of Transportation (Non-Medical): No  Physical Activity: Insufficiently Active (10/07/2023)   Exercise Vital Sign    Days of Exercise per Week: 5 days    Minutes of Exercise per Session: 20 min  Stress: No  Stress Concern Present (10/07/2023)   [employer] of Occupational Health - Occupational Stress Questionnaire    Feeling of Stress : Not at all  Social Connections: Moderately Integrated (07/01/2024)   Social Connection and Isolation Panel    Frequency of Communication with Friends and Family: More than three times a week    Frequency of Social Gatherings with Friends and Family: Twice a week    Attends Religious Services: More than 4 times per year    Active Member of [employer] or Organizations: No    Attends Banker Meetings: Never    Marital Status: Married  Catering Manager Violence: Not At Risk (07/01/2024)   Epic    Fear of Current or Ex-Partner: No    Emotionally Abused: No    Physically Abused: No    Sexually Abused:  No  Depression (PHQ2-9): Low Risk (06/26/2024)   Depression (PHQ2-9)    PHQ-2 Score: 0  Alcohol  Screen: Low Risk (10/07/2023)   Alcohol  Screen    Last Alcohol  Screening Score (AUDIT): 0  Housing: Low Risk (07/01/2024)   Epic    Unable to Pay for Housing in the Last Year: No    Number of Times Moved in the Last Year: 0    Homeless in the Last Year: No  Utilities: Not At Risk (07/01/2024)   Epic    Threatened with loss of utilities: No  Health Literacy: Adequate Health Literacy (10/07/2023)   B1300 Health Literacy    Frequency of need for help with medical instructions: Never     Family History  Problem Relation Age of Onset   Cancer Father 81       prostate   Coronary artery disease Father 59       MI   Prostate cancer Father    Cancer Paternal Grandfather 84       prostate   Prostate cancer Paternal Grandfather    Stroke Mother    Hypertension Mother    Cancer Mother 31       colon   Colon cancer Mother    Coronary artery disease Maternal Uncle    Cancer Sister 45       breast   Breast cancer Sister    Colon cancer Maternal Grandmother    Diabetes Neg Hx    Colon polyps Neg Hx    Esophageal cancer Neg Hx    Rectal cancer Neg Hx     Stomach cancer Neg Hx     Current Medications[2]   Physical exam:  Vitals:   07/02/24 0451 07/02/24 0500 07/02/24 0648 07/02/24 0753  BP: 108/61  138/65 131/67  Pulse: 85  85 91  Resp: 16  17 18   Temp: 99.7 F (37.6 C)  99.2 F (37.3 C) 99.3 F (37.4 C)  TempSrc: Oral  Oral   SpO2: 93%  99% 94%  Weight:  220 lb 0.3 oz (99.8 kg)    Height:       Physical Exam        Latest Ref Rng & Units 07/02/2024    5:58 AM  CMP  Glucose 70 - 99 mg/dL 881   BUN 8 - 23 mg/dL 15   Creatinine 9.38 - 1.24 mg/dL 9.28   Sodium 864 - 854 mmol/L 134   Potassium 3.5 - 5.1 mmol/L 3.7   Chloride 98 - 111 mmol/L 102   CO2 22 - 32 mmol/L 25   Calcium  8.9 - 10.3 mg/dL 8.3       Latest Ref Rng & Units 07/02/2024    5:58 AM  CBC  WBC 4.0 - 10.5 K/uL 0.6   Hemoglobin 13.0 - 17.0 g/dL 89.9   Hematocrit 60.9 - 52.0 % 28.7   Platelets 150 - 400 K/uL 67     @IMAGES @  MR LUMBAR SPINE WO CONTRAST Result Date: 07/02/2024 CLINICAL DATA:  Initial evaluation for compression fracture. EXAM: MRI LUMBAR SPINE WITHOUT CONTRAST TECHNIQUE: Multiplanar, multisequence MR imaging of the lumbar spine was performed. No intravenous contrast was administered. COMPARISON:  CT from 07/01/2024. FINDINGS: Segmentation: Standard. Lowest well-formed disc space labeled the L5-S1 level. Alignment: Mild dextroscoliosis. 3 mm retrolisthesis of L1 on L2, with trace 2 mm anterolisthesis of L5 on S1. Vertebrae: Multiple scattered marrow replacing lesions seen throughout the visualized lumbar spine, with involvement of the T12, L1, L2, and L5  vertebral bodies, consistent with metastatic disease. Extension to involve the posterior elements on the right at L1 and bilaterally at L5 and S1. Diffuse involvement of the visualized sacrum and posterior right greater than left iliac wings. Associated pathologic compression fracture at the inferior endplate of T12 with 40% height loss without significant retropulsion. Additional pathologic  compression fracture involving the superior endplate of L1 with mild 10% height loss without retropulsion. Additional pathologic compression fracture at the superior endplate of L2 with up to 40% height loss without retropulsion. Vertebral body height otherwise maintained. Minimal epidural extension of tumor into the right greater than left ventral epidural space at the level of L1 (series 8, image 12). No other significant extra osseous or epidural extension of tumor. Conus medullaris and cauda equina: Conus extends to the L1 level. Conus and cauda equina appear normal. Paraspinal and other soft tissues: Paraspinous soft tissues demonstrate no acute finding. Few scattered T2 hyperintense cyst noted about the visualized kidneys, benign in appearance, no follow-up imaging recommended. Retroaortic left renal vein noted. Disc levels: T11-12: Mild disc bulge with endplate spurring. Right greater than left facet hypertrophy. No spinal stenosis. Mild to moderate bilateral foraminal narrowing, worse on the left. T12-L1: Mild right eccentric disc bulge with endplate spurring. Mild bilateral facet hypertrophy. No spinal stenosis. Foramina remain patent. L1-2: Intervertebral disc space narrowing with trace retrolisthesis. Mild disc bulge with reactive endplate spurring, worse on the right. Mild facet hypertrophy. No significant spinal stenosis. Mild right L1 foraminal narrowing. Left neural foramina remains patent. L2-3: Degenerative vertebral disc space narrowing with diffuse disc bulge. Reactive endplate spurring. Disc bulging asymmetric to the left with superimposed left foraminal to extraforaminal disc protrusion (series 8, image 21). Mild bilateral facet hypertrophy. No significant spinal stenosis. Mild left L2 foraminal narrowing. Right neural foramen remains patent. L3-4: Degenerative intervertebral disc space narrowing with diffuse disc bulge, asymmetric to the left. Reactive endplate spurring, also greater on the  left. Mild to moderate facet and ligament flavum hypertrophy. Resultant mild canal with moderate left lateral recess stenosis. Mild right with moderate left L3 foraminal narrowing. L4-5: Disc desiccation with mild diffuse disc bulge. Reactive endplate spurring, worse on the left. Mild bilateral facet and ligament flavum hypertrophy. No significant spinal stenosis. Foramina remain patent. L5-S1: Degenerative intervertebral disc space narrowing with disc desiccation diffuse disc bulge. Reactive endplate spurring, worse on the right. Mild left with moderate right facet hypertrophy. Mild epidural lipomatosis. Resultant mild narrowing of the right lateral recess. Central canal remains patent. Moderate to severe right L5 foraminal stenosis. Left neural foramen remains patent. IMPRESSION: 1. Multifocal osseous metastases involving the lumbar spine as well as the visualized sacrum and pelvis. 2. Associated pathologic compression fractures involving the inferior endplate of T12 as well as the superior endplates of L1 and L2. Associated height loss of up to 40% without retropulsion. 3. Minimal epidural extension of tumor into the right greater than left ventral epidural space at the level of L1. No other significant extra osseous or epidural extension of tumor. 4. Underlying multilevel degenerative spondylosis and facet arthrosis as above. Resultant mild canal with moderate left lateral recess stenosis at L3-4, with moderate left L3 foraminal narrowing. 5. Moderate to severe right L5 foraminal narrowing related to disc bulge, reactive endplate change, and facet degeneration Electronically Signed   By: Morene Hoard M.D.   On: 07/02/2024 00:54   CT ABDOMEN PELVIS W CONTRAST Result Date: 07/01/2024 CLINICAL DATA:  Lower abdominal pain, neutropenic fever. Patient on chemotherapy for prostate  cancer with lung mass. EXAM: CT ABDOMEN AND PELVIS WITH CONTRAST TECHNIQUE: Multidetector CT imaging of the abdomen and pelvis was  performed using the standard protocol following bolus administration of intravenous contrast. RADIATION DOSE REDUCTION: This exam was performed according to the departmental dose-optimization program which includes automated exposure control, adjustment of the mA and/or kV according to patient size and/or use of iterative reconstruction technique. CONTRAST:  OMNIPAQUE  IOHEXOL  300 MG/ML  SOLN COMPARISON:  04/25/2024, 04/11/2024. FINDINGS: Lower chest: The heart is mildly enlarged and multi-vessel coronary artery calcifications are noted. There is a small left pleural effusion with a associated atelectasis or infiltrate. Multiple nodules are identified at the right lung base, increased in number from the prior exam. The left lung base is obscured due to associated infiltrates. Hepatobiliary: No focal abnormality in the liver. The liver has a nodular contour suggesting underlying cirrhosis. Fatty infiltration is also seen. No biliary ductal dilatation. The gallbladder is without stones. Pancreas: Unremarkable. No pancreatic ductal dilatation or surrounding inflammatory changes. Spleen: Normal in size without focal abnormality. Adrenals/Urinary Tract: The adrenal glands are within normal limits. The kidneys enhance symmetrically. A stable hypodensity is noted in the left kidney, suggesting cyst. No renal calculus or obstructive uropathy bilaterally. The bladder is stable. Stomach/Bowel: The stomach is within normal limits. No bowel obstruction, free air, or pneumatosis is seen. A moderate amount of retained stool is present in the colon. There is mild thickening of the walls of the transverse and descending colon. Appendix appears normal. Vascular/Lymphatic: Aortic atherosclerosis. No enlarged abdominal or pelvic lymph nodes. Reproductive: Prostate is unremarkable. Other: Rim calcified structures are seen in the pelvis suggesting fat necrosis. There is trace ascites in the right pericolic gutter. Musculoskeletal:  Degenerative changes are present in the thoracolumbar spine. Sclerotic changes are noted in the visualized thoracolumbar spine, sacrum, and pelvis. A lytic lesion with bony discontinuity is noted in the inferior pubic ramus on the left with likely associated soft tissue component. There is a new compression deformity in the superior endplate at L2 with loss of vertebral body height of 10% centrally, likely pathologic fracture. IMPRESSION: 1. Mild colonic wall thickening involving the transverse and descending colon, possible colitis. 2. Increased airspace disease at the left lung base, possible atelectasis or infiltrate. 3. Small left pleural effusion. 4. Osteoblastic metastasis in the thoracolumbar spine, sacrum, and pelvis. A new compression deformity in the superior endplate at L2, likely pathologic fracture. 5. Increased number of pulmonary nodules, suspicious for metastatic disease. 6. Hepatic steatosis with morphologic changes in the liver suggestive of cirrhosis. 7. Aortic atherosclerosis and coronary artery calcifications. Electronically Signed   By: Leita Birmingham M.D.   On: 07/01/2024 16:53   DG Chest Port 1 View Result Date: 07/01/2024 CLINICAL DATA:  Fever, chemotherapy patient. EXAM: PORTABLE CHEST 1 VIEW COMPARISON:  11/02/2022, 04/11/2024. FINDINGS: The heart is borderline enlarged and the mediastinal contour is within normal limits. There is atherosclerotic calcification of the aorta. Interstitial prominence is noted bilaterally. A few scattered nodular densities are seen bilaterally. There are suspected trace bilateral pleural effusions. No pneumothorax is seen. A left chest port terminates in the right atrium. IMPRESSION: 1. Interstitial prominence bilaterally with scattered nodular opacities, unchanged from the previous exam. 2. Suspected small bilateral pleural effusions. Electronically Signed   By: Leita Birmingham M.D.   On: 07/01/2024 15:24    Assessment and plan- Patient is a 79 y.o. male  with history of metastatic prostate cancer and metastatic neuroendocrine tumor of the lung presently  being treated for prostate cancer with ADT Zytiga and docetaxel  admitted for neutropenic fever  Neutropenia: Since patient already received Neulasta  after chemotherapy on 12/31/ 2025 there would be no role for Neupogen  at this time.  Patient has been afebrile since admission and hemodynamically stable on IV antibiotics.  Blood cultures show no growth so far and lactic acid is normal.  Continue conservative management as per primary team and patient will follow-up with Dr. Sherrod upon discharge  [facility] to hold zytiga during hospitalization and resume upon discharge. If patient remains afebrile and otherwise stable from medicine standpoint, he can be discharged on oral antibiotics despite neutropenia   Visit Diagnosis 1. Neutropenic fever   2. Closed compression fracture of L2 lumbar vertebra, initial encounter (HCC)     Dr. Annah Skene, MD, MPH [facility] [phone] 07/02/2024                    [1] No Known Allergies [2]  Current Facility-Administered Medications:    ceFEPIme  (MAXIPIME ) 2 g in sodium chloride  0.9 % 100 mL IVPB, 2 g, Intravenous, Q8H, Fernand Prost, MD, Last Rate: 200 mL/hr at 07/02/24 0619, 2 g at 07/02/24 9380   Chlorhexidine  Gluconate Cloth 2 % PADS 6 each, 6 each, Topical, Daily, Von Bellis, MD   heparin  injection 5,000 Units, 5,000 Units, Subcutaneous, Q8H, Fernand Prost, MD, 5,000 Units at 07/02/24 0617   HYDROcodone -acetaminophen  (NORCO/VICODIN) 5-325 MG per tablet 1-2 tablet, 1-2 tablet, Oral, Q6H PRN, Fernand Prost, MD, 1 tablet at 07/02/24 0103   insulin  aspart (novoLOG ) injection 0-5 Units, 0-5 Units, Subcutaneous, QHS, Fernand Prost, MD   insulin  aspart (novoLOG ) injection 0-9 Units, 0-9 Units, Subcutaneous, TID WC, Fernand Prost, MD   lactated ringers  infusion, 150 mL/hr, Intravenous, Continuous, Cleatus Hoof V, MD, Last  Rate: 150 mL/hr at 07/01/24 2240, 150 mL/hr at 07/01/24 2240   melatonin tablet 5 mg, 5 mg, Oral, QHS, Khan, Ghalib, MD, 5 mg at 07/01/24 2230   methocarbamol  (ROBAXIN ) tablet 500 mg, 500 mg, Oral, Q6H PRN, Cleatus Hoof GAILS, MD, 500 mg at 07/02/24 9675   metroNIDAZOLE  (FLAGYL ) IVPB 500 mg, 500 mg, Intravenous, Q12H, Cleatus Hoof GAILS, MD, Last Rate: 100 mL/hr at 07/02/24 0230, 500 mg at 07/02/24 0230   nystatin  (MYCOSTATIN ) 100000 UNIT/ML suspension 500,000 Units, 5 mL, Oral, QID, Khan, Ghalib, MD, 500,000 Units at 07/01/24 2229   pantoprazole  (PROTONIX ) EC tablet 40 mg, 40 mg, Oral, Daily, Khan, Ghalib, MD, 40 mg at 07/01/24 2230   phenol (CHLORASEPTIC) mouth spray 1 spray, 1 spray, Mouth/Throat, PRN, Fernand Prost, MD   rosuvastatin  (CRESTOR ) tablet 5 mg, 5 mg, Oral, Daily, Khan, Ghalib, MD   senna-docusate (Senokot-S) tablet 1 tablet, 1 tablet, Oral, QHS PRN, Fernand Prost, MD   sodium chloride  flush (NS) 0.9 % injection 3 mL, 3 mL, Intravenous, Q12H, Fernand Prost, MD, 3 mL at 07/02/24 0048   vancomycin  (VANCOREADY) IVPB 1250 mg/250 mL, 1,250 mg, Intravenous, Q24H, Clair Marolyn NOVAK, RPH  "

## 2024-07-02 NOTE — Progress Notes (Signed)
 Critical Lab values received at 10:32  Absolute neutrophils: 0.2 WBC: 0.6   Md aware

## 2024-07-02 NOTE — Evaluation (Signed)
 Physical Therapy Evaluation Patient Details Name: Larry Kent MRN: [id number] DOB: 20-Aug-1945 Today's Date: 07/02/2024  History of Present Illness  Pt is a 79 y.o. year old male presenting to the ED with generalized weakness, RLE pain along with chills. MRI shows multifocal osseous metastases involving the lumbar spine as well as the visualized sacrum and pelvis, associated pathologic compression fractures involving the inferior endplate of T12 and superior endplates of L1 and L2. Associated height loss of up to 40% without retropulsion. 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  Clinical Impression  Patient noted to be in seated EOB position at PT arrival in room, for an initial PT evaluation due to a decline in functional status, with baseline mobility reported as modI, and currently requiring supervision for ambulation. The patient is A&O x 4, presenting with good willingness to work with PT. The patient resides in a 2 level home and lives with spouse and children with family/friend support. There are noSTE inside the residence. Gait was assessed with 1+ hand held assist with IV pole for ambulation distance of 50' feet. Gait mechanic observations noted mild unsteadiness, flexed posture and increased lateral shift with loading phase of gait. The overall clinical impression is that the patient presents with mild mobility limitations. Recommended skilled PT will address safety, mobility, and discharge planning.        If plan is discharge home, recommend the following: A little help with walking and/or transfers;Help with stairs or ramp for entrance;Assist for transportation   Can travel by private vehicle        Equipment Recommendations Rolling walker (2 wheels)  Recommendations for Other Services       Functional Status Assessment Patient has had a recent decline in their functional status and  demonstrates the ability to make significant improvements in function in a reasonable and predictable amount of time.     Precautions / Restrictions Restrictions Weight Bearing Restrictions Per Provider Order: No      Mobility  Bed Mobility Overal bed mobility: Modified Independent                  Transfers Overall transfer level: Needs assistance Equipment used: 1 person hand held assist Transfers: Sit to/from Stand                  Ambulation/Gait Ambulation/Gait assistance: Supervision Gait Distance (Feet): 50 Feet Assistive device: IV Pole, 1 person hand held assist Gait Pattern/deviations: Step-through pattern, Wide base of support, Trendelenburg, Trunk flexed, Shuffle Gait velocity: decreased        Stairs            Wheelchair Mobility     Tilt Bed    Modified Rankin (Stroke Patients Only)       Balance Overall balance assessment: Needs assistance Sitting-balance support: Feet supported Sitting balance-Leahy Scale: Normal     Standing balance support: Single extremity supported, During functional activity Standing balance-Leahy Scale: Good                               Pertinent Vitals/Pain Pain Assessment Pain Score: 2  Pain Location: BLEs/thighs Pain Intervention(s): Limited activity within patient's tolerance, Monitored during session, Repositioned    Home Living Family/patient expects to be discharged to:: Private residence Living Arrangements:  (adult son lives there too but works) Available Help at Discharge: Family;Available 24 hours/day Type of  Home: House Home Access: Level entry     Alternate Level Stairs-Number of Steps: flight Home Layout: Two level;Bed/bath upstairs Home Equipment: None      Prior Function Prior Level of Function : Independent/Modified Independent;Driving             Mobility Comments: indep ADLs Comments: indep     Extremity/Trunk Assessment   Upper Extremity  Assessment Upper Extremity Assessment: Overall WFL for tasks assessed    Lower Extremity Assessment Lower Extremity Assessment: Overall WFL for tasks assessed    Cervical / Trunk Assessment Cervical / Trunk Assessment: Kyphotic  Communication   Communication Communication: No apparent difficulties    Cognition Arousal: Alert Behavior During Therapy: WFL for tasks assessed/performed   PT - Cognitive impairments: No apparent impairments                         Following commands: Intact       Cueing       General Comments      Exercises     Assessment/Plan    PT Assessment Patient needs continued PT services  PT Problem List Decreased strength;Decreased activity tolerance;Decreased balance;Decreased mobility       PT Treatment Interventions Gait training;Stair training;Functional mobility training;Therapeutic activities;Patient/family education;Neuromuscular re-education;Balance training;Therapeutic exercise    PT Goals (Current goals can be found in the Care Plan section)  Acute Rehab PT Goals Patient Stated Goal: Wants to head home PT Goal Formulation: With patient Time For Goal Achievement: 07/16/24 Potential to Achieve Goals: Good    Frequency Min 2X/week     Co-evaluation               AM-PAC PT 6 Clicks Mobility  Outcome Measure Help needed turning from your back to your side while in a flat bed without using bedrails?: None Help needed moving from lying on your back to sitting on the side of a flat bed without using bedrails?: A Little Help needed moving to and from a bed to a chair (including a wheelchair)?: A Little Help needed standing up from a chair using your arms (e.g., wheelchair or bedside chair)?: A Little Help needed to walk in hospital room?: A Little Help needed climbing 3-5 steps with a railing? : A Little 6 Click Score: 19    End of Session   Activity Tolerance: Patient tolerated treatment well Patient left: in  bed;with call bell/phone within reach Nurse Communication: Mobility status PT Visit Diagnosis: Other abnormalities of gait and mobility (R26.89);Muscle weakness (generalized) (M62.81);Difficulty in walking, not elsewhere classified (R26.2)    Time: 9064-9043 PT Time Calculation (min) (ACUTE ONLY): 21 min   Charges:   PT Evaluation $PT Eval Low Complexity: 1 Low   PT General Charges $$ ACUTE PT VISIT: 1 Visit         Sherlean Lesches DPT, PT    Larry Kent 07/02/2024, 10:07 AM

## 2024-07-02 NOTE — TOC Initial Note (Addendum)
 Transition of Care [facility]) - Initial/Assessment Note    Patient Details  Name: Larry Kent MRN: [id number] Date of Birth: October 06, 1945  Transition of Care [facility]) CM/SW Contact:    Melaysia Streed L Ivy Meriwether, LCSW Phone Number: 07/02/2024, 3:09 PM  Clinical Narrative:                  Brief assessment completed. CSW met with patient, son was at bedside. SW role and HIPAA explained. CSW reviewed recommendations for Home Health. Son advised of no preferences but he was agreeable to [facility] given the agency accepts patients insurance.   Son advised that he is managing care for both parents and wanted advise regarding his mother. CSW advised that patients spouse will need to see her PCP regarding concerns.   DME discussed. Son advised that he lives in the home with his parents. He advised of no DME needs. Patient stated that he has resistance bands that he uses at home and a recumbent bike.   PCP is Anton Blas MD.   [id number] - [address], [place] - [address]  [address] [place] [address]  Phone: [phone] Fax: [phone]    Confirmed with [facility]. CSW spoke with [place] who advised that there will be a delay in [facility]. CSW advised that patient made aware and is agreeable.      Patient Goals and CMS Choice            Expected Discharge Plan and Services                                              Prior Living Arrangements/Services                       Activities of Daily Living   ADL Screening (condition at time of admission) Independently performs ADLs?: Yes (appropriate for developmental age) Is the patient deaf or have difficulty hearing?: No Does the patient have difficulty seeing, even when wearing glasses/contacts?: No Does the patient have difficulty concentrating, remembering, or making decisions?: No  Permission Sought/Granted                  Emotional Assessment              Admission diagnosis:   Neutropenic fever [D70.9, R50.81] Closed compression fracture of L2 lumbar vertebra, initial encounter (HCC) [S32.020A] Severe neutropenia [D70.9] Patient Active Problem List   Diagnosis Date Noted   Essential hypertension 07/01/2024   GERD (gastroesophageal reflux disease) 07/01/2024   Severe neutropenia 07/01/2024   Vitamin D  deficiency 01/26/2024   Urge urinary incontinence 07/01/2023   Type 2 diabetes mellitus with other specified complication (HCC) 06/29/2023   Encounter for antineoplastic immunotherapy 03/23/2023   Cirrhosis of liver (HCC) 03/23/2023   Encounter for antineoplastic chemotherapy 09/28/2022   Catheter-associated urinary tract infection 09/11/2022   Enterococcal bacteremia 09/10/2022   S/P coronary artery stent placement 09/10/2022   Port or reservoir infection 09/10/2022   Coronary artery disease involving native coronary artery of native heart with unstable angina pectoris (HCC) 09/08/2022   Left main coronary artery disease 09/08/2022   Hematuria 09/08/2022   Acute ST elevation myocardial infarction (STEMI) of inferior wall (HCC) 09/07/2022   Thrombocytopenia 08/21/2022   Prostate cancer metastatic to multiple sites (HCC) 08/20/2022   Pancytopenia due to antineoplastic chemotherapy 08/18/2022   Bilateral  hydronephrosis 08/18/2022   Indwelling urinary catheter present 08/11/2022   Malignant neoplasm of unspecified part of unspecified bronchus or lung (HCC) 08/11/2022   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   Lung nodule 07/16/2022   Sensorineural hearing loss (SNHL) of left ear with restricted hearing of right ear 02/22/2020   Cerebrovascular disease 02/22/2020   Cervical radiculopathy 02/08/2018   Aortic stenosis 07/01/2016   Nonspecific abnormal electrocardiogram (ECG) (EKG) 07/01/2016   Overweight with body mass index (BMI) 25.0-29.9 06/10/2015   Carotid stenosis 05/30/2015   Occlusion of left vertebral artery 05/30/2015   Advanced care  planning/counseling discussion 05/23/2014   Osteoarthritis of right knee 05/23/2014   Health maintenance examination 05/23/2014   Medicare annual wellness visit, subsequent 02/12/2012   Hyperlipidemia with target LDL less than 70 02/12/2012   FHx: prostate cancer    FHx: colon cancer    PCP:  [name], MD Pharmacy:   [facility] [phone] - [address] [place] - [address] [place] [address] Phone: [phone] Fax: [phone]     Social Drivers of Health (SDOH) Social History: SDOH Screenings   Food Insecurity: No Food Insecurity (07/01/2024)  Housing: Low Risk (07/01/2024)  Transportation Needs: No Transportation Needs (07/01/2024)  Recent Concern: Transportation Needs - Unmet Transportation Needs (05/12/2024)  Utilities: Not At Risk (07/01/2024)  Alcohol  Screen: Low Risk (10/07/2023)  Depression (PHQ2-9): Low Risk (06/26/2024)  Financial Resource Strain: Low Risk (10/07/2023)  Physical Activity: Insufficiently Active (10/07/2023)  Social Connections: Moderately Integrated (07/01/2024)  Stress: No Stress Concern Present (10/07/2023)  Tobacco Use: Low Risk (07/01/2024)  Health Literacy: Adequate Health Literacy (10/07/2023)   SDOH Interventions:     Readmission Risk Interventions     No data to display

## 2024-07-02 NOTE — Progress Notes (Signed)
 Pharmacy Antibiotic Note  Larry Kent is a 79 y.o. male admitted on 07/01/2024 with neutropenia . Pharmacy has been consulted for vancomycin  dosing. Patient is also ordered cefepime  and Flagyl .  Plan:  Adjust vancomycin  to 1.25 g IV q24h --Calculated AUC: 453.5, Cmin: 9.8 --Scr 0.8, IBW, Vd 0.5 --Daily Scr per protocol  Height: 5' 3 (160 cm) Weight: 99.8 kg (220 lb 0.3 oz) IBW/kg (Calculated) : 56.9  Temp (24hrs), Avg:99.6 F (37.6 C), Min:99 F (37.2 C), Max:100.2 F (37.9 C)  Recent Labs  Lab 06/26/24 1102 07/01/24 1300 07/02/24 0558  WBC 6.5 0.4* 0.6*  CREATININE 0.81 0.78 0.71  LATICACIDVEN  --  1.5  --     Estimated Creatinine Clearance: 79.8 mL/min (by C-G formula based on SCr of 0.71 mg/dL).    Allergies[1]  Antimicrobials this admission:   >>    >>   Dose adjustments this admission:   Microbiology results: 1/3 BCx: NGTD  Thank you for allowing pharmacy to be a part of this patients care.  Larry Kent 07/02/2024 7:53 AM    [1] No Known Allergies

## 2024-07-02 NOTE — Progress Notes (Signed)
 Patient LR complete. Order to continue LR. Called floor stock and waiting on more LR to continue treatment.

## 2024-07-02 NOTE — Progress Notes (Signed)
 [facility] Progress Note  Patient: Larry Kent    FMW:[id number]  DOA: 07/01/2024     Date of Service: the patient was seen and examined on 07/02/2024  Chief Complaint  Patient presents with   Weakness   Brief hospital course:    Larry Kent is a 79 y.o. year old male with medical history of HTN, HLD, metastatic poorly differentiated carcinoma with primary lung vs prostate cancer. He is followed closely by oncology. He is on palliative chemotherapy and currently getting Eligard q4 months, oral zytiga, and IV docetaxel . He completed palliative radiation on 12/17. Pt's last session was on 12/19 and he received GCSF on 12/31. He is presenting to the ED with generalized weakness, right lower extremity pain along with chills. On arrival to the ED patient was noted to be HDS stable. Code sepsis was activated given his leukopenia. CBC without differential showed WBC 0.4, baseline anemia, and thrombocytopenia at 95k.  CMP shows mild hyponatremia, mild hyperglycemia, mild hypocalcemia and hypoproteinemia.  UA without signs of infection.  Lactic acid normal.  Blood cultures ordered and pending.  Respiratory panel negative for COVID, flu, RSV.  Patient reported some lower abdominal discomfort and CT abdomen pelvis was obtained that showed mild colonic wall thickening in the transverse and descending colon concerning for colitis.  Right airspace disease of the left lung concerning for atelectasis versus infiltrate.  Multiple metastatic lesions including the spine, pulmonary nodules and hepatic changes concerning for cirrhosis.  He also noted a new compression deformity at L2 concerning for a fracture.  Given patient's leukopenia along with chills and elevated temperature, [facility] contacted for admission.   Assessment and Plan:  # Febrile neutropenia Unknown source of infection ANC 0.1 >0.2 Recently patient has received Neulasta , as per heme-onc it will take 1 week to see the response Continue  cefepime , Flagyl  and vancomycin  Pharmacy consulted for dosing and trough monitor Follow cultures Follow heme-onc for DC plan and antibiotics.   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 Nieves  S/p palliative systemic chemotherapy S/p  palliative radiotherapy to the metastatic disease at the L1 vertebral body as well as the posterior iliac bones.  CURRENT THERAPY: Hormonal therapy with Eligard 30 mg intramuscular every 4 months in addition to Zytiga orally and docetaxel  75 mg/M2 every 3 weeks as well as prednisone  5 mg p.o. twice daily.  First dose June 26, 2024.  Oncology consulted: Follow for further recommendation    # Oral candidiasis: Mild. States he gets this after getting his chemotherapy.  No report of dysphagia or odynophagia.  Nystatin  oral solution ordered.   # Thrombocytopenia: Likely due to chemotherapy.  Check CBC daily  # Hyponatremia: Likely secondary to poor oral intake given his report.  Patient is status post multiple liters of IVF.  Will continue to monitor his sodium.   # L2 compression fracture: Noted on CT abdomen pelvis.  Neurosurgery consulted, recommended no intervention and TLSO brace if needed for comfort.    # Chronic Problems: Restart home meds once med reconciliation is completed.    # Hyperlipidemia: Continue home dose statin.   # Type 2 diabetes: Start on SSI. # GERD: Continue home PPI # Cirrhosis of the liver: This is being evaluated by GI in the outpatient setting.  Currently at baseline.  Defer management to outpatient setting.    Lower extremity venous duplex: 1. No evidence  of deep venous thrombosis in either lower extremity. 2. Right popliteal fossa Baker's cyst.   Body mass index is 38.97 kg/m.  Interventions:   Diet: Heart healthy diet DVT Prophylaxis: SCDs, due to  thrombocytopenia  Advance goals of care discussion: Full code  Family Communication: family was present at bedside, at the time of interview.  The pt provided permission to discuss medical plan with the family. Opportunity was given to ask question and all questions were answered satisfactorily.   Disposition:  Pt is from home, admitted with febrile neutropenia, still has neutropenia and IV antibiotic, which precludes a safe discharge. Discharge to home, when stable, most likely 1 to 2 days.  Subjective: No significant events overnight.  Patient was febrile and sepsis protocol was included.  In the morning the patient was laying calmly, he was complaining of some discomfort in the rectal area due to bowel movements, seems skin irritation.  Complaining of lower extremity pain due to chemotherapy, denied any other complaints.   Physical Exam: General: NAD, lying comfortably Appear in no distress, affect appropriate Eyes: PERRLA ENT: Oral Mucosa Clear, moist  Neck: no JVD,  Cardiovascular: S1 and S2 Present, no Murmur,  Respiratory: good respiratory effort, Bilateral Air entry equal and Decreased, no Crackles, no wheezes Abdomen: Bowel Sound present, Soft and no tenderness,  Skin: no rashes Extremities: no Pedal edema, no calf tenderness Neurologic: without any new focal findings Gait not checked due to patient safety concerns  Vitals:   07/02/24 0451 07/02/24 0500 07/02/24 0648 07/02/24 0753  BP: 108/61  138/65 131/67  Pulse: 85  85 91  Resp: 16  17 18   Temp: 99.7 F (37.6 C)  99.2 F (37.3 C) 99.3 F (37.4 C)  TempSrc: Oral  Oral   SpO2: 93%  99% 94%  Weight:  99.8 kg    Height:        Intake/Output Summary (Last 24 hours) at 07/02/2024 1327 Last data filed at 07/02/2024 0753 Gross per 24 hour  Intake 1300 ml  Output 150 ml  Net 1150 ml   Filed Weights   07/01/24 1258 07/02/24 0500  Weight: 86.6 kg 99.8 kg    Data Reviewed: I have personally reviewed and interpreted  daily labs, tele strips, imagings as discussed above. I reviewed all nursing notes, pharmacy notes, vitals, pertinent old records I have discussed plan of care as described above with RN and patient/family.  CBC: Recent Labs  Lab 06/26/24 1102 07/01/24 1300 07/02/24 0558 07/02/24 0943  WBC 6.5 0.4* 0.6* 0.6*  NEUTROABS 5.8 0.1*  --  PENDING  HGB 11.0* 12.1* 10.0* 10.6*  HCT 31.4* 35.5* 28.7* 30.2*  MCV 99.7 101.7* 100.0 100.0  PLT 157 95* 67* 72*   Basic Metabolic Panel: Recent Labs  Lab 06/26/24 1102 07/01/24 1300 07/02/24 0558  NA 139 128* 134*  K 3.9 4.1 3.7  CL 104 95* 102  CO2 22 22 25   GLUCOSE 181* 121* 118*  BUN 20 22 15   CREATININE 0.81 0.78 0.71  CALCIUM  9.2 8.5* 8.3*  MG  --  1.7  --     Studies: US  Venous Img Lower Bilateral (DVT) Result Date: 07/02/2024 CLINICAL DATA:  Lower extremity edema. EXAM: BILATERAL LOWER EXTREMITY VENOUS DOPPLER ULTRASOUND TECHNIQUE: Gray-scale sonography with graded compression, as well as color Doppler and duplex ultrasound were performed to evaluate the lower extremity deep venous systems from the level of the common femoral vein and including the common femoral, femoral, profunda femoral, popliteal and calf veins  including the posterior tibial, peroneal and gastrocnemius veins when visible. The superficial great saphenous vein was also interrogated. Spectral Doppler was utilized to evaluate flow at rest and with distal augmentation maneuvers in the common femoral, femoral and popliteal veins. COMPARISON:  None Available. FINDINGS: RIGHT LOWER EXTREMITY Common Femoral Vein: No evidence of thrombus. Normal compressibility, respiratory phasicity and response to augmentation. Saphenofemoral Junction: No evidence of thrombus. Normal compressibility and flow on color Doppler imaging. Profunda Femoral Vein: No evidence of thrombus. Normal compressibility and flow on color Doppler imaging. Femoral Vein: No evidence of thrombus. Normal  compressibility, respiratory phasicity and response to augmentation. Popliteal Vein: No evidence of thrombus. Normal compressibility, respiratory phasicity and response to augmentation. Calf Veins: No evidence of thrombus. Normal compressibility and flow on color Doppler imaging. Superficial Great Saphenous Vein: No evidence of thrombus. Normal compressibility. Venous Reflux:  None. Other Findings: No evidence of superficial thrombophlebitis. Fluid collection in the right popliteal fossa measures roughly 3.5 x 4 x 1.5 cm and is consistent with a Baker's cyst. LEFT LOWER EXTREMITY Common Femoral Vein: No evidence of thrombus. Normal compressibility, respiratory phasicity and response to augmentation. Saphenofemoral Junction: No evidence of thrombus. Normal compressibility and flow on color Doppler imaging. Profunda Femoral Vein: No evidence of thrombus. Normal compressibility and flow on color Doppler imaging. Femoral Vein: No evidence of thrombus. Normal compressibility, respiratory phasicity and response to augmentation. Popliteal Vein: No evidence of thrombus. Normal compressibility, respiratory phasicity and response to augmentation. Calf Veins: No evidence of thrombus. Normal compressibility and flow on color Doppler imaging. Superficial Great Saphenous Vein: No evidence of thrombus. Normal compressibility. Venous Reflux:  None. Other Findings: No evidence of superficial thrombophlebitis or abnormal fluid collection. IMPRESSION: 1. No evidence of deep venous thrombosis in either lower extremity. 2. Right popliteal fossa Baker's cyst. Electronically Signed   By: Marcey Moan M.D.   On: 07/02/2024 10:55   MR LUMBAR SPINE WO CONTRAST Result Date: 07/02/2024 CLINICAL DATA:  Initial evaluation for compression fracture. EXAM: MRI LUMBAR SPINE WITHOUT CONTRAST TECHNIQUE: Multiplanar, multisequence MR imaging of the lumbar spine was performed. No intravenous contrast was administered. COMPARISON:  CT from  07/01/2024. FINDINGS: Segmentation: Standard. Lowest well-formed disc space labeled the L5-S1 level. Alignment: Mild dextroscoliosis. 3 mm retrolisthesis of L1 on L2, with trace 2 mm anterolisthesis of L5 on S1. Vertebrae: Multiple scattered marrow replacing lesions seen throughout the visualized lumbar spine, with involvement of the T12, L1, L2, and L5 vertebral bodies, consistent with metastatic disease. Extension to involve the posterior elements on the right at L1 and bilaterally at L5 and S1. Diffuse involvement of the visualized sacrum and posterior right greater than left iliac wings. Associated pathologic compression fracture at the inferior endplate of T12 with 40% height loss without significant retropulsion. Additional pathologic compression fracture involving the superior endplate of L1 with mild 10% height loss without retropulsion. Additional pathologic compression fracture at the superior endplate of L2 with up to 40% height loss without retropulsion. Vertebral body height otherwise maintained. Minimal epidural extension of tumor into the right greater than left ventral epidural space at the level of L1 (series 8, image 12). No other significant extra osseous or epidural extension of tumor. Conus medullaris and cauda equina: Conus extends to the L1 level. Conus and cauda equina appear normal. Paraspinal and other soft tissues: Paraspinous soft tissues demonstrate no acute finding. Few scattered T2 hyperintense cyst noted about the visualized kidneys, benign in appearance, no follow-up imaging recommended. Retroaortic left renal vein noted. Disc  levels: T11-12: Mild disc bulge with endplate spurring. Right greater than left facet hypertrophy. No spinal stenosis. Mild to moderate bilateral foraminal narrowing, worse on the left. T12-L1: Mild right eccentric disc bulge with endplate spurring. Mild bilateral facet hypertrophy. No spinal stenosis. Foramina remain patent. L1-2: Intervertebral disc space  narrowing with trace retrolisthesis. Mild disc bulge with reactive endplate spurring, worse on the right. Mild facet hypertrophy. No significant spinal stenosis. Mild right L1 foraminal narrowing. Left neural foramina remains patent. L2-3: Degenerative vertebral disc space narrowing with diffuse disc bulge. Reactive endplate spurring. Disc bulging asymmetric to the left with superimposed left foraminal to extraforaminal disc protrusion (series 8, image 21). Mild bilateral facet hypertrophy. No significant spinal stenosis. Mild left L2 foraminal narrowing. Right neural foramen remains patent. L3-4: Degenerative intervertebral disc space narrowing with diffuse disc bulge, asymmetric to the left. Reactive endplate spurring, also greater on the left. Mild to moderate facet and ligament flavum hypertrophy. Resultant mild canal with moderate left lateral recess stenosis. Mild right with moderate left L3 foraminal narrowing. L4-5: Disc desiccation with mild diffuse disc bulge. Reactive endplate spurring, worse on the left. Mild bilateral facet and ligament flavum hypertrophy. No significant spinal stenosis. Foramina remain patent. L5-S1: Degenerative intervertebral disc space narrowing with disc desiccation diffuse disc bulge. Reactive endplate spurring, worse on the right. Mild left with moderate right facet hypertrophy. Mild epidural lipomatosis. Resultant mild narrowing of the right lateral recess. Central canal remains patent. Moderate to severe right L5 foraminal stenosis. Left neural foramen remains patent. IMPRESSION: 1. Multifocal osseous metastases involving the lumbar spine as well as the visualized sacrum and pelvis. 2. Associated pathologic compression fractures involving the inferior endplate of T12 as well as the superior endplates of L1 and L2. Associated height loss of up to 40% without retropulsion. 3. Minimal epidural extension of tumor into the right greater than left ventral epidural space at the level  of L1. No other significant extra osseous or epidural extension of tumor. 4. Underlying multilevel degenerative spondylosis and facet arthrosis as above. Resultant mild canal with moderate left lateral recess stenosis at L3-4, with moderate left L3 foraminal narrowing. 5. Moderate to severe right L5 foraminal narrowing related to disc bulge, reactive endplate change, and facet degeneration Electronically Signed   By: Morene Hoard M.D.   On: 07/02/2024 00:54   CT ABDOMEN PELVIS W CONTRAST Result Date: 07/01/2024 CLINICAL DATA:  Lower abdominal pain, neutropenic fever. Patient on chemotherapy for prostate cancer with lung mass. EXAM: CT ABDOMEN AND PELVIS WITH CONTRAST TECHNIQUE: Multidetector CT imaging of the abdomen and pelvis was performed using the standard protocol following bolus administration of intravenous contrast. RADIATION DOSE REDUCTION: This exam was performed according to the departmental dose-optimization program which includes automated exposure control, adjustment of the mA and/or kV according to patient size and/or use of iterative reconstruction technique. CONTRAST:  OMNIPAQUE  IOHEXOL  300 MG/ML  SOLN COMPARISON:  04/25/2024, 04/11/2024. FINDINGS: Lower chest: The heart is mildly enlarged and multi-vessel coronary artery calcifications are noted. There is a small left pleural effusion with a associated atelectasis or infiltrate. Multiple nodules are identified at the right lung base, increased in number from the prior exam. The left lung base is obscured due to associated infiltrates. Hepatobiliary: No focal abnormality in the liver. The liver has a nodular contour suggesting underlying cirrhosis. Fatty infiltration is also seen. No biliary ductal dilatation. The gallbladder is without stones. Pancreas: Unremarkable. No pancreatic ductal dilatation or surrounding inflammatory changes. Spleen: Normal in size without focal  abnormality. Adrenals/Urinary Tract: The adrenal glands are  within normal limits. The kidneys enhance symmetrically. A stable hypodensity is noted in the left kidney, suggesting cyst. No renal calculus or obstructive uropathy bilaterally. The bladder is stable. Stomach/Bowel: The stomach is within normal limits. No bowel obstruction, free air, or pneumatosis is seen. A moderate amount of retained stool is present in the colon. There is mild thickening of the walls of the transverse and descending colon. Appendix appears normal. Vascular/Lymphatic: Aortic atherosclerosis. No enlarged abdominal or pelvic lymph nodes. Reproductive: Prostate is unremarkable. Other: Rim calcified structures are seen in the pelvis suggesting fat necrosis. There is trace ascites in the right pericolic gutter. Musculoskeletal: Degenerative changes are present in the thoracolumbar spine. Sclerotic changes are noted in the visualized thoracolumbar spine, sacrum, and pelvis. A lytic lesion with bony discontinuity is noted in the inferior pubic ramus on the left with likely associated soft tissue component. There is a new compression deformity in the superior endplate at L2 with loss of vertebral body height of 10% centrally, likely pathologic fracture. IMPRESSION: 1. Mild colonic wall thickening involving the transverse and descending colon, possible colitis. 2. Increased airspace disease at the left lung base, possible atelectasis or infiltrate. 3. Small left pleural effusion. 4. Osteoblastic metastasis in the thoracolumbar spine, sacrum, and pelvis. A new compression deformity in the superior endplate at L2, likely pathologic fracture. 5. Increased number of pulmonary nodules, suspicious for metastatic disease. 6. Hepatic steatosis with morphologic changes in the liver suggestive of cirrhosis. 7. Aortic atherosclerosis and coronary artery calcifications. Electronically Signed   By: Leita Birmingham M.D.   On: 07/01/2024 16:53   DG Chest Port 1 View Result Date: 07/01/2024 CLINICAL DATA:  Fever,  chemotherapy patient. EXAM: PORTABLE CHEST 1 VIEW COMPARISON:  11/02/2022, 04/11/2024. FINDINGS: The heart is borderline enlarged and the mediastinal contour is within normal limits. There is atherosclerotic calcification of the aorta. Interstitial prominence is noted bilaterally. A few scattered nodular densities are seen bilaterally. There are suspected trace bilateral pleural effusions. No pneumothorax is seen. A left chest port terminates in the right atrium. IMPRESSION: 1. Interstitial prominence bilaterally with scattered nodular opacities, unchanged from the previous exam. 2. Suspected small bilateral pleural effusions. Electronically Signed   By: Leita Birmingham M.D.   On: 07/01/2024 15:24    Scheduled Meds:  aspirin  EC  81 mg Oral Daily   Chlorhexidine  Gluconate Cloth  6 each Topical Daily   clopidogrel   75 mg Oral Daily   heparin   5,000 Units Subcutaneous Q8H   insulin  aspart  0-5 Units Subcutaneous QHS   insulin  aspart  0-9 Units Subcutaneous TID WC   liver oil-zinc  oxide  1 Application Topical Daily   loratadine   10 mg Oral Daily   melatonin  5 mg Oral QHS   nystatin   5 mL Oral QID   pantoprazole   40 mg Oral Daily   rosuvastatin   5 mg Oral Daily   sodium chloride  flush  3 mL Intravenous Q12H   tamsulosin   0.4 mg Oral BID   Continuous Infusions:  ceFEPime  (MAXIPIME ) IV 2 g (07/02/24 0619)   lactated ringers  150 mL/hr (07/02/24 1012)   metronidazole  500 mg (07/02/24 0230)   vancomycin      PRN Meds: HYDROcodone -acetaminophen , methocarbamol , phenol, senna-docusate  Time spent: 55 minutes  Author: ELVAN SOR. MD [facility] 07/02/2024 1:27 PM  To reach On-call, see care teams to locate the attending and reach out to them via [email or web address]. If 7PM-7AM, please contact night-coverage If you  still have difficulty reaching the attending provider, please page the [facility] (Director on Call) for [facility] on amion for assistance.

## 2024-07-02 NOTE — Consult Note (Signed)
 Consulting Department:  Emergency department  Primary Physician:  [name], MD  Chief Complaint: Admitting for sepsis, found to have spine fracture  History of Present Illness: 07/02/2024 Larry Kent is a 79 y.o. male who presents with the recent admission reason for neutropenic fever and sepsis.  On workup was found to have a spine fracture.  Neurosurgery was consulted for management.  He has a history of prostate cancer metastatic to his spine status post radiation.  He previously suffered from some neuropathic pain however states that this is not as severe as it was previously.  Review of Systems:  A 10 point review of systems is negative, except for the pertinent positives and negatives detailed in the HPI.  Past Medical History: Past Medical History:  Diagnosis Date   Allergy    Arthritis    both knees    BCC (basal cell carcinoma), arm, right 09/2019   MOHS ([place])   BCC (basal cell carcinoma), face 2014   L preauricular s/p MOHS   BCC (basal cell carcinoma), face 02/2018   L upper lip   BPH (benign prostatic hypertrophy)    Dr. Nieves @ [facility]   Carotid stenosis 05/2015   RICA 40-59%, LICA 1-39%, L vertebral occlusion, rpt 1 yr   Cataract    removed both eyes    Diabetes mellitus without complication (HCC)    FHx: colon cancer    FHx: prostate cancer    Heart murmur    mild aortic stenosis    History of kidney stones    Hyperlipidemia    borderline- on rosuvastatin  now normal    Lung cancer (HCC) 07/2022   Myocardial infarction (HCC) 09/07/2022   Occlusion of right vertebral artery 05/2015   Prostate cancer (HCC) 07/2022    Past Surgical History: Past Surgical History:  Procedure Laterality Date   BASAL CELL CARCINOMA EXCISION  05/2018   lip, 2020 x3  basal cells removed    CATARACT EXTRACTION  2008   Left   CATARACT EXTRACTION  2013   Right ([place])   COLONOSCOPY  ~2010   medium int hemorrhoids, o/w WNL, rpt 5 yrs given fmhx (Dr.  Celestia)   COLONOSCOPY  08/2019   TAs, diverticulosis, rpt 3 yrs ([place])   COLONOSCOPY  12/2023   2TAs, diverticulosis, int hem, no f/u needed ([place])   CORONARY/GRAFT ACUTE MI REVASCULARIZATION N/A 09/07/2022   Procedure: Coronary/Graft Acute MI Revascularization;  Surgeon: Wendel Lurena POUR, MD;  Location: [facility];  Service: Cardiovascular;  Laterality: N/A;   ENDARTERECTOMY Right 12/01/2019   Procedure: RIGHT ENDARTERECTOMY CAROTID;  Surgeon: Gretta Lonni PARAS, MD;  Location: [facility] OR;  Service: Vascular;  Laterality: Right;   exercise treadmill  11/2005   WNL ([place])   FINE NEEDLE ASPIRATION  07/28/2022   Procedure: FINE NEEDLE ASPIRATION (FNA) LINEAR;  Surgeon: Brenna Adine LITTIE, DO;  Location: [facility];  Service: Pulmonary;;   HERNIA REPAIR  2008   Right   IR IMAGING GUIDED PORT INSERTION  08/11/2022   IR IMAGING GUIDED PORT INSERTION  10/08/2022   IR REMOVAL TUN ACCESS W/ PORT W/O FL MOD SED  09/10/2022   LEFT HEART CATH AND CORONARY ANGIOGRAPHY N/A 09/07/2022   Procedure: LEFT HEART CATH AND CORONARY ANGIOGRAPHY;  Surgeon: Wendel Lurena POUR, MD;  Location: [facility];  Service: Cardiovascular;  Laterality: N/A;   MOHS SURGERY Left spring 2014   basal cell face   PATCH ANGIOPLASTY Right 12/01/2019   Procedure: PATCH ANGIOPLASTY  USING XENOSURE BIOLOGIC PATCH;  Surgeon: Gretta Lonni PARAS, MD;  Location: [facility] OR;  Service: Vascular;  Laterality: Right;   Testicular Biopsy  2003   benign, varicocele   US  ECHOCARDIOGRAPHY  11/2005   aortic sclerosis, EF 55-60%, diastolic dysfunction   VIDEO BRONCHOSCOPY WITH ENDOBRONCHIAL ULTRASOUND Bilateral 07/28/2022   Procedure: VIDEO BRONCHOSCOPY WITH ENDOBRONCHIAL ULTRASOUND;  Surgeon: Brenna Adine CROME, DO;  Location: [facility];  Service: Pulmonary;  Laterality: Bilateral;    Allergies: Allergies as of 07/01/2024   (No Known Allergies)    Medications: Current Medications[1]   Social  History: Social History[2]  Family Medical History: Family History  Problem Relation Age of Onset   Cancer Father 61       prostate   Coronary artery disease Father 75       MI   Prostate cancer Father    Cancer Paternal Grandfather 23       prostate   Prostate cancer Paternal Grandfather    Stroke Mother    Hypertension Mother    Cancer Mother 67       colon   Colon cancer Mother    Coronary artery disease Maternal Uncle    Cancer Sister 64       breast   Breast cancer Sister    Colon cancer Maternal Grandmother    Diabetes Neg Hx    Colon polyps Neg Hx    Esophageal cancer Neg Hx    Rectal cancer Neg Hx    Stomach cancer Neg Hx     Physical Examination: Vitals:   07/02/24 0648 07/02/24 0753  BP: 138/65 131/67  Pulse: 85 91  Resp: 17 18  Temp: 99.2 F (37.3 C) 99.3 F (37.4 C)  SpO2: 99% 94%     General: Patient is well developed, well nourished, calm, collected, and in no apparent distress.  NEUROLOGICAL:  General: Currently frustrated with his admission Awake, alert, oriented to person, place, and time.  Pupils equal round and reactive to light.  Facial tone is symmetric.  Tongue protrusion is midline.    Strength: No major deficits noted in the bilateral lower extremities.  Ambulating with nursing and therapy Imaging: MR LUMBAR SPINE WO CONTRAST Result Date: 07/02/2024 CLINICAL DATA:  Initial evaluation for compression fracture. EXAM: MRI LUMBAR SPINE WITHOUT CONTRAST TECHNIQUE: Multiplanar, multisequence MR imaging of the lumbar spine was performed. No intravenous contrast was administered. COMPARISON:  CT from 07/01/2024. FINDINGS: Segmentation: Standard. Lowest well-formed disc space labeled the L5-S1 level. Alignment: Mild dextroscoliosis. 3 mm retrolisthesis of L1 on L2, with trace 2 mm anterolisthesis of L5 on S1. Vertebrae: Multiple scattered marrow replacing lesions seen throughout the visualized lumbar spine, with involvement of the T12, L1, L2, and  L5 vertebral bodies, consistent with metastatic disease. Extension to involve the posterior elements on the right at L1 and bilaterally at L5 and S1. Diffuse involvement of the visualized sacrum and posterior right greater than left iliac wings. Associated pathologic compression fracture at the inferior endplate of T12 with 40% height loss without significant retropulsion. Additional pathologic compression fracture involving the superior endplate of L1 with mild 10% height loss without retropulsion. Additional pathologic compression fracture at the superior endplate of L2 with up to 40% height loss without retropulsion. Vertebral body height otherwise maintained. Minimal epidural extension of tumor into the right greater than left ventral epidural space at the level of L1 (series 8, image 12). No other significant extra osseous or epidural extension of tumor. Conus medullaris and cauda equina:  Conus extends to the L1 level. Conus and cauda equina appear normal. Paraspinal and other soft tissues: Paraspinous soft tissues demonstrate no acute finding. Few scattered T2 hyperintense cyst noted about the visualized kidneys, benign in appearance, no follow-up imaging recommended. Retroaortic left renal vein noted. Disc levels: T11-12: Mild disc bulge with endplate spurring. Right greater than left facet hypertrophy. No spinal stenosis. Mild to moderate bilateral foraminal narrowing, worse on the left. T12-L1: Mild right eccentric disc bulge with endplate spurring. Mild bilateral facet hypertrophy. No spinal stenosis. Foramina remain patent. L1-2: Intervertebral disc space narrowing with trace retrolisthesis. Mild disc bulge with reactive endplate spurring, worse on the right. Mild facet hypertrophy. No significant spinal stenosis. Mild right L1 foraminal narrowing. Left neural foramina remains patent. L2-3: Degenerative vertebral disc space narrowing with diffuse disc bulge. Reactive endplate spurring. Disc bulging  asymmetric to the left with superimposed left foraminal to extraforaminal disc protrusion (series 8, image 21). Mild bilateral facet hypertrophy. No significant spinal stenosis. Mild left L2 foraminal narrowing. Right neural foramen remains patent. L3-4: Degenerative intervertebral disc space narrowing with diffuse disc bulge, asymmetric to the left. Reactive endplate spurring, also greater on the left. Mild to moderate facet and ligament flavum hypertrophy. Resultant mild canal with moderate left lateral recess stenosis. Mild right with moderate left L3 foraminal narrowing. L4-5: Disc desiccation with mild diffuse disc bulge. Reactive endplate spurring, worse on the left. Mild bilateral facet and ligament flavum hypertrophy. No significant spinal stenosis. Foramina remain patent. L5-S1: Degenerative intervertebral disc space narrowing with disc desiccation diffuse disc bulge. Reactive endplate spurring, worse on the right. Mild left with moderate right facet hypertrophy. Mild epidural lipomatosis. Resultant mild narrowing of the right lateral recess. Central canal remains patent. Moderate to severe right L5 foraminal stenosis. Left neural foramen remains patent. IMPRESSION: 1. Multifocal osseous metastases involving the lumbar spine as well as the visualized sacrum and pelvis. 2. Associated pathologic compression fractures involving the inferior endplate of T12 as well as the superior endplates of L1 and L2. Associated height loss of up to 40% without retropulsion. 3. Minimal epidural extension of tumor into the right greater than left ventral epidural space at the level of L1. No other significant extra osseous or epidural extension of tumor. 4. Underlying multilevel degenerative spondylosis and facet arthrosis as above. Resultant mild canal with moderate left lateral recess stenosis at L3-4, with moderate left L3 foraminal narrowing. 5. Moderate to severe right L5 foraminal narrowing related to disc bulge,  reactive endplate change, and facet degeneration Electronically Signed   By: Morene Hoard M.D.   On: 07/02/2024 00:54   CT ABDOMEN PELVIS W CONTRAST Result Date: 07/01/2024 CLINICAL DATA:  Lower abdominal pain, neutropenic fever. Patient on chemotherapy for prostate cancer with lung mass. EXAM: CT ABDOMEN AND PELVIS WITH CONTRAST TECHNIQUE: Multidetector CT imaging of the abdomen and pelvis was performed using the standard protocol following bolus administration of intravenous contrast. RADIATION DOSE REDUCTION: This exam was performed according to the departmental dose-optimization program which includes automated exposure control, adjustment of the mA and/or kV according to patient size and/or use of iterative reconstruction technique. CONTRAST:  OMNIPAQUE  IOHEXOL  300 MG/ML  SOLN COMPARISON:  04/25/2024, 04/11/2024. FINDINGS: Lower chest: The heart is mildly enlarged and multi-vessel coronary artery calcifications are noted. There is a small left pleural effusion with a associated atelectasis or infiltrate. Multiple nodules are identified at the right lung base, increased in number from the prior exam. The left lung base is obscured due to associated  infiltrates. Hepatobiliary: No focal abnormality in the liver. The liver has a nodular contour suggesting underlying cirrhosis. Fatty infiltration is also seen. No biliary ductal dilatation. The gallbladder is without stones. Pancreas: Unremarkable. No pancreatic ductal dilatation or surrounding inflammatory changes. Spleen: Normal in size without focal abnormality. Adrenals/Urinary Tract: The adrenal glands are within normal limits. The kidneys enhance symmetrically. A stable hypodensity is noted in the left kidney, suggesting cyst. No renal calculus or obstructive uropathy bilaterally. The bladder is stable. Stomach/Bowel: The stomach is within normal limits. No bowel obstruction, free air, or pneumatosis is seen. A moderate amount of retained stool  is present in the colon. There is mild thickening of the walls of the transverse and descending colon. Appendix appears normal. Vascular/Lymphatic: Aortic atherosclerosis. No enlarged abdominal or pelvic lymph nodes. Reproductive: Prostate is unremarkable. Other: Rim calcified structures are seen in the pelvis suggesting fat necrosis. There is trace ascites in the right pericolic gutter. Musculoskeletal: Degenerative changes are present in the thoracolumbar spine. Sclerotic changes are noted in the visualized thoracolumbar spine, sacrum, and pelvis. A lytic lesion with bony discontinuity is noted in the inferior pubic ramus on the left with likely associated soft tissue component. There is a new compression deformity in the superior endplate at L2 with loss of vertebral body height of 10% centrally, likely pathologic fracture. IMPRESSION: 1. Mild colonic wall thickening involving the transverse and descending colon, possible colitis. 2. Increased airspace disease at the left lung base, possible atelectasis or infiltrate. 3. Small left pleural effusion. 4. Osteoblastic metastasis in the thoracolumbar spine, sacrum, and pelvis. A new compression deformity in the superior endplate at L2, likely pathologic fracture. 5. Increased number of pulmonary nodules, suspicious for metastatic disease. 6. Hepatic steatosis with morphologic changes in the liver suggestive of cirrhosis. 7. Aortic atherosclerosis and coronary artery calcifications. Electronically Signed   By: Leita Birmingham M.D.   On: 07/01/2024 16:53   DG Chest Port 1 View Result Date: 07/01/2024 CLINICAL DATA:  Fever, chemotherapy patient. EXAM: PORTABLE CHEST 1 VIEW COMPARISON:  11/02/2022, 04/11/2024. FINDINGS: The heart is borderline enlarged and the mediastinal contour is within normal limits. There is atherosclerotic calcification of the aorta. Interstitial prominence is noted bilaterally. A few scattered nodular densities are seen bilaterally. There are  suspected trace bilateral pleural effusions. No pneumothorax is seen. A left chest port terminates in the right atrium. IMPRESSION: 1. Interstitial prominence bilaterally with scattered nodular opacities, unchanged from the previous exam. 2. Suspected small bilateral pleural effusions. Electronically Signed   By: Leita Birmingham M.D.   On: 07/01/2024 15:24     I have personally reviewed the images and agree with the above interpretation.  Labs:    Latest Ref Rng & Units 07/02/2024    5:58 AM 07/01/2024    1:00 PM 06/26/2024   11:02 AM  CBC  WBC 4.0 - 10.5 K/uL 0.6  0.4  6.5   Hemoglobin 13.0 - 17.0 g/dL 89.9  87.8  88.9   Hematocrit 39.0 - 52.0 % 28.7  35.5  31.4   Platelets 150 - 400 K/uL 67  95  157       Latest Ref Rng & Units 07/02/2024    5:58 AM 07/01/2024    1:00 PM 06/26/2024   11:02 AM  BMP  Glucose 70 - 99 mg/dL 881  878  818   BUN 8 - 23 mg/dL 15  22  20    Creatinine 0.61 - 1.24 mg/dL 9.28  9.21  9.18  Sodium 135 - 145 mmol/L 134  128  139   Potassium 3.5 - 5.1 mmol/L 3.7  4.1  3.9   Chloride 98 - 111 mmol/L 102  95  104   CO2 22 - 32 mmol/L 25  22  22    Calcium  8.9 - 10.3 mg/dL 8.3  8.5  9.2     INR  1.1 (01/03 1444)   Assessment and Plan: Larry Kent is a pleasant 79 y.o. male with presentation consistent with neutropenic fever and sepsis.  Has a history of prostate cancer with spine metastases status post radiation..  Was found to have multiple spinal fractures on workup.  Concern for multifocal spinal metastases with pathologic fractures.  Imaging consistent with multifocal lesions in compression fractures, possibly pathologic in nature.  No evidence of acute compression of neural structures.  There are some areas of likely chronic degenerative compression.  From a neurosurgical standpoint no indication for any acute intervention.  These do not appear to be unstable by SINS criteria, but if patient is having worsening back pain can utilize TLSO for comfort. Currently  follows with outside spine surgeon  From a spinal metastases standpoint would defer ongoing management and treatment to medical and radiation oncology.  Penne MICAEL Sharps, MD/MSCR Dept. of Neurosurgery       [1]  Current Facility-Administered Medications:    ceFEPIme  (MAXIPIME ) 2 g in sodium chloride  0.9 % 100 mL IVPB, 2 g, Intravenous, Q8H, Fernand Prost, MD, Last Rate: 200 mL/hr at 07/02/24 0619, 2 g at 07/02/24 9380   heparin  injection 5,000 Units, 5,000 Units, Subcutaneous, Q8H, Fernand Prost, MD, 5,000 Units at 07/02/24 0617   HYDROcodone -acetaminophen  (NORCO/VICODIN) 5-325 MG per tablet 1-2 tablet, 1-2 tablet, Oral, Q6H PRN, Fernand Prost, MD, 1 tablet at 07/02/24 0103   insulin  aspart (novoLOG ) injection 0-5 Units, 0-5 Units, Subcutaneous, QHS, Fernand Prost, MD   insulin  aspart (novoLOG ) injection 0-9 Units, 0-9 Units, Subcutaneous, TID WC, Fernand Prost, MD   lactated ringers  infusion, 150 mL/hr, Intravenous, Continuous, Cleatus Hoof V, MD, Last Rate: 150 mL/hr at 07/01/24 2240, 150 mL/hr at 07/01/24 2240   melatonin tablet 5 mg, 5 mg, Oral, QHS, Khan, Ghalib, MD, 5 mg at 07/01/24 2230   methocarbamol  (ROBAXIN ) tablet 500 mg, 500 mg, Oral, Q6H PRN, Cleatus Hoof GAILS, MD, 500 mg at 07/02/24 0324   metroNIDAZOLE  (FLAGYL ) IVPB 500 mg, 500 mg, Intravenous, Q12H, Cleatus Hoof GAILS, MD, Last Rate: 100 mL/hr at 07/02/24 0230, 500 mg at 07/02/24 0230   nystatin  (MYCOSTATIN ) 100000 UNIT/ML suspension 500,000 Units, 5 mL, Oral, QID, Khan, Ghalib, MD, 500,000 Units at 07/01/24 2229   pantoprazole  (PROTONIX ) EC tablet 40 mg, 40 mg, Oral, Daily, Fernand Prost, MD, 40 mg at 07/01/24 2230   phenol (CHLORASEPTIC) mouth spray 1 spray, 1 spray, Mouth/Throat, PRN, Fernand Prost, MD   rosuvastatin  (CRESTOR ) tablet 5 mg, 5 mg, Oral, Daily, Khan, Ghalib, MD   senna-docusate (Senokot-S) tablet 1 tablet, 1 tablet, Oral, QHS PRN, Fernand Prost, MD   sodium chloride  flush (NS) 0.9 % injection 3 mL, 3 mL, Intravenous,  Q12H, Fernand Prost, MD, 3 mL at 07/02/24 0048   vancomycin  (VANCOREADY) IVPB 1250 mg/250 mL, 1,250 mg, Intravenous, Q24H, Clair Marolyn NOVAK, RPH [2]  Social History Tobacco Use   Smoking status: Never   Smokeless tobacco: Never  Vaping Use   Vaping status: Never Used  Substance Use Topics   Alcohol  use: Not Currently    Comment: rare   Drug use: No

## 2024-07-03 ENCOUNTER — Other Ambulatory Visit: Payer: Self-pay

## 2024-07-03 DIAGNOSIS — S32020A Wedge compression fracture of second lumbar vertebra, initial encounter for closed fracture: Secondary | ICD-10-CM

## 2024-07-03 DIAGNOSIS — D709 Neutropenia, unspecified: Secondary | ICD-10-CM | POA: Diagnosis not present

## 2024-07-03 LAB — BASIC METABOLIC PANEL WITH GFR
Anion gap: 8 (ref 5–15)
BUN: 13 mg/dL (ref 8–23)
CO2: 22 mmol/L (ref 22–32)
Calcium: 8.3 mg/dL — ABNORMAL LOW (ref 8.9–10.3)
Chloride: 104 mmol/L (ref 98–111)
Creatinine, Ser: 0.78 mg/dL (ref 0.61–1.24)
GFR, Estimated: >60 mL/min
Glucose, Bld: 110 mg/dL — ABNORMAL HIGH (ref 70–99)
Potassium: 3.7 mmol/L (ref 3.5–5.1)
Sodium: 134 mmol/L — ABNORMAL LOW (ref 135–145)

## 2024-07-03 LAB — CBC WITH DIFFERENTIAL/PLATELET
Abs Immature Granulocytes: 0.32 K/uL — ABNORMAL HIGH (ref 0.00–0.07)
Basophils Absolute: 0.1 K/uL (ref 0.0–0.1)
Basophils Relative: 1 %
Eosinophils Absolute: 0.1 K/uL (ref 0.0–0.5)
Eosinophils Relative: 2 %
HCT: 27.2 % — ABNORMAL LOW (ref 39.0–52.0)
Hemoglobin: 9.5 g/dL — ABNORMAL LOW (ref 13.0–17.0)
Immature Granulocytes: 8 %
Lymphocytes Relative: 9 %
Lymphs Abs: 0.3 K/uL — ABNORMAL LOW (ref 0.7–4.0)
MCH: 35.2 pg — ABNORMAL HIGH (ref 26.0–34.0)
MCHC: 34.9 g/dL (ref 30.0–36.0)
MCV: 100.7 fL — ABNORMAL HIGH (ref 80.0–100.0)
Monocytes Absolute: 0.5 K/uL (ref 0.1–1.0)
Monocytes Relative: 13 %
Neutro Abs: 2.6 K/uL (ref 1.7–7.7)
Neutrophils Relative %: 67 %
Platelets: 59 K/uL — ABNORMAL LOW (ref 150–400)
RBC: 2.7 MIL/uL — ABNORMAL LOW (ref 4.22–5.81)
RDW: 13.4 % (ref 11.5–15.5)
Smear Review: NORMAL
WBC: 3.9 K/uL — ABNORMAL LOW (ref 4.0–10.5)
nRBC: 0.8 % — ABNORMAL HIGH (ref 0.0–0.2)

## 2024-07-03 LAB — CBC
HCT: 27.6 % — ABNORMAL LOW (ref 39.0–52.0)
Hemoglobin: 9.5 g/dL — ABNORMAL LOW (ref 13.0–17.0)
MCH: 34.8 pg — ABNORMAL HIGH (ref 26.0–34.0)
MCHC: 34.4 g/dL (ref 30.0–36.0)
MCV: 101.1 fL — ABNORMAL HIGH (ref 80.0–100.0)
Platelets: 56 K/uL — ABNORMAL LOW (ref 150–400)
RBC: 2.73 MIL/uL — ABNORMAL LOW (ref 4.22–5.81)
RDW: 13.3 % (ref 11.5–15.5)
WBC: 3.7 K/uL — ABNORMAL LOW (ref 4.0–10.5)
nRBC: 0 % (ref 0.0–0.2)

## 2024-07-03 LAB — PHOSPHORUS: Phosphorus: 2.1 mg/dL — ABNORMAL LOW (ref 2.5–4.6)

## 2024-07-03 LAB — GLUCOSE, CAPILLARY: Glucose-Capillary: 98 mg/dL (ref 70–99)

## 2024-07-03 LAB — MAGNESIUM: Magnesium: 1.8 mg/dL (ref 1.7–2.4)

## 2024-07-03 MED ORDER — FUROSEMIDE 10 MG/ML IJ SOLN
40.0000 mg | Freq: Once | INTRAMUSCULAR | Status: AC
  Administered 2024-07-03: 40 mg via INTRAVENOUS
  Filled 2024-07-03: qty 4

## 2024-07-03 MED ORDER — PANTOPRAZOLE SODIUM 40 MG PO TBEC
40.0000 mg | DELAYED_RELEASE_TABLET | Freq: Every day | ORAL | 11 refills | Status: AC
  Filled 2024-07-03: qty 30, 30d supply, fill #0

## 2024-07-03 MED ORDER — AMOXICILLIN-POT CLAVULANATE 875-125 MG PO TABS
1.0000 | ORAL_TABLET | Freq: Two times a day (BID) | ORAL | 0 refills | Status: AC
  Filled 2024-07-03: qty 10, 5d supply, fill #0

## 2024-07-03 MED ORDER — VITAMIN D (ERGOCALCIFEROL) 1.25 MG (50000 UNIT) PO CAPS
50000.0000 [IU] | ORAL_CAPSULE | ORAL | 0 refills | Status: AC
  Filled 2024-07-03: qty 12, 84d supply, fill #0

## 2024-07-03 MED ORDER — POTASSIUM & SODIUM PHOSPHATES 280-160-250 MG PO PACK
2.0000 | PACK | Freq: Three times a day (TID) | ORAL | Status: DC
  Administered 2024-07-03: 2 via ORAL
  Filled 2024-07-03: qty 2

## 2024-07-03 MED ORDER — DOXYCYCLINE MONOHYDRATE 100 MG PO TABS
100.0000 mg | ORAL_TABLET | Freq: Two times a day (BID) | ORAL | 0 refills | Status: AC
  Filled 2024-07-03: qty 10, 5d supply, fill #0

## 2024-07-03 MED ORDER — VITAMIN D (ERGOCALCIFEROL) 1.25 MG (50000 UNIT) PO CAPS
50000.0000 [IU] | ORAL_CAPSULE | ORAL | Status: DC
  Administered 2024-07-03: 50000 [IU] via ORAL
  Filled 2024-07-03: qty 1

## 2024-07-03 MED ORDER — NYSTATIN 100000 UNIT/ML MT SUSP
5.0000 mL | Freq: Four times a day (QID) | OROMUCOSAL | 0 refills | Status: AC
  Filled 2024-07-03: qty 60, 3d supply, fill #0

## 2024-07-03 NOTE — Discharge Summary (Signed)
 [facility] Discharge Summary   Patient: Larry Kent FMW:[id number]  PCP: [name], MD  Date of admission: 07/01/2024   Date of discharge:  07/03/2024     Discharge Diagnoses:  Principal Problem:   Severe neutropenia Active Problems:   Hyperlipidemia with target LDL less than 70   Malignant neoplasm of unspecified part of unspecified bronchus or lung (HCC)   Prostate cancer metastatic to multiple sites (HCC)   Thrombocytopenia   Cirrhosis of liver (HCC)   Type 2 diabetes mellitus with other specified complication (HCC)   Essential hypertension   GERD (gastroesophageal reflux disease)   Closed compression fracture of L2 lumbar vertebra, initial encounter (HCC)   Admitted From: Home Disposition:  Home with home health services  Recommendations for Outpatient Follow-up:  F/u with PCP in 1 wk F/u with Oncologist in 1 wk Follow up LABS/TEST:  CBC with diff in 1 wk Vit d level in btw 3-6 months   Contact information for follow-up providers     [name], MD Follow up.   Specialty: Family Medicine Why: hospital follow up Contact information: [address] [place] [address] [phone]              Contact information for after-discharge care     Home Medical Care     [facility]) .   Service: Home Health Services                    Diet recommendation: Regular diet  Activity: The patient is advised to gradually reintroduce usual activities, as tolerated  Discharge Condition: stable  Code Status: Full code   History of present illness: As per the H and P dictated on admission.  Hospital Course:   KHAREE LESESNE is a 79 y.o. year old male with medical history of HTN, HLD, metastatic poorly differentiated carcinoma with primary lung vs prostate cancer. He is followed closely by oncology. He is on palliative chemotherapy and currently getting Eligard q4 months, oral zytiga, and IV  docetaxel . He completed palliative radiation on 12/17. Pt's last session was on 12/19 and he received GCSF on 12/31. He is presenting to the ED with generalized weakness, right lower extremity pain along with chills. On arrival to the ED patient was noted to be HDS stable. Code sepsis was activated given his leukopenia. CBC without differential showed WBC 0.4, baseline anemia, and thrombocytopenia at 95k.  CMP shows mild hyponatremia, mild hyperglycemia, mild hypocalcemia and hypoproteinemia.  UA without signs of infection.  Lactic acid normal.  Blood cultures ordered and pending.  Respiratory panel negative for COVID, flu, RSV.  Patient reported some lower abdominal discomfort and CT abdomen pelvis was obtained that showed mild colonic wall thickening in the transverse and descending colon concerning for colitis.  Right airspace disease of the left lung concerning for atelectasis versus infiltrate.  Multiple metastatic lesions including the spine, pulmonary nodules and hepatic changes concerning for cirrhosis.  He also noted a new compression deformity at L2 concerning for a fracture.  Given patient's leukopenia along with chills and elevated temperature, [facility] contacted for admission.     Assessment and Plan:   # Febrile neutropenia Unknown source of infection ANC 0.1 >0.2>2.6 improved Recently patient has received Neulasta , as per heme-onc it will take 1 week to see the response. S/p cefepime , Flagyl  and vancomycin  given during hospital stay. Blood culture NGTD.  Patient was discharged on prophylactic antibiotics Augmentin  twice daily for 5  days and doxycycline  twice daily for 5 days. Follow-up with heme-onc in 1 week.   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 Nieves  S/p palliative systemic chemotherapy S/p  palliative radiotherapy  to the metastatic disease at the L1 vertebral body as well as the posterior iliac bones.  CURRENT THERAPY: Hormonal therapy with Eligard 30 mg intramuscular every 4 months in addition to Zytiga orally and docetaxel  75 mg/M2 every 3 weeks as well as prednisone  5 mg p.o. twice daily.  First dose June 26, 2024.  Oncology consult appreciated.  # Oral candidiasis: Mild. States he gets this after getting his chemotherapy.  No report of dysphagia or odynophagia.  Nystatin  oral solution continued.   # Thrombocytopenia: Likely due to chemotherapy.  Platelets 59K stable    # Hyponatremia: Likely secondary to poor oral intake given his report.  Patient is status post multiple liters of IVF.  Na 134 stable.     # L2 compression fracture: Noted on CT abdomen pelvis.  Neurosurgery consulted, recommended no intervention and TLSO brace if needed for comfort.    # Hyperlipidemia: Continue home dose statin. # Type 2 diabetes: Start on SSI. # GERD: Patient was on Plavix  so discontinued omeprazole  due to interaction, started pantoprazole . # Cirrhosis of the liver: This is being evaluated by GI in the outpatient setting.  Currently at baseline.  Defer management to outpatient setting. # BPH, continued Flomax . # Vitamin D  insufficiency: started vitamin D  50,000 units p.o. weekly, follow with PCP to repeat vitamin D  level after 3 to 6 months.   # Lower extremity venous duplex: 1. No evidence of deep venous thrombosis in either lower extremity. 2. Right popliteal fossa Baker's cyst.    Body mass index is 38.97 kg/m.  Nutrition Interventions:   - Patient was instructed, not to drive, operate heavy machinery, perform activities at heights, swimming or participation in water activities or provide baby sitting services while on Pain, Sleep and Anxiety Medications; until his outpatient Physician has advised to do so again.  - Also recommended to not to take more than prescribed Pain, Sleep and Anxiety  Medications.  Patient was seen by physical therapy, who recommended sheHome health, which was arranged. On the day of the discharge the patient's vitals were stable, and no other acute medical condition were reported by patient. the patient was felt safe to be discharge at Home with Home health.  Consultants: Oncology and neurosurgery Procedures: None  Discharge Exam: General: Appear in no distress, Oral Mucosa Clear, moist. Cardiovascular: S1 and S2 Present, no Murmur, Respiratory: normal respiratory effort, Bilateral Air entry present and no Crackles, no wheezes Abdomen: Bowel Sound present, Soft and no tenderness. Extremities: no Pedal edema, no calf tenderness Neurology: alert and oriented to time, place, and person affect appropriate.  Filed Weights   07/01/24 1258 07/02/24 0500  Weight: 86.6 kg 99.8 kg   Vitals:   07/03/24 0455 07/03/24 0844  BP: 120/66 100/64  Pulse: 85 82  Resp: 16 16  Temp: 98.6 F (37 C) 98.6 F (37 C)  SpO2: 97% 94%    DISCHARGE MEDICATION: Allergies as of 07/03/2024   No Known Allergies      Medication List     PAUSE taking these medications    Vitamin D3 25 MCG (1000 UT) Caps Wait to take this until: August 28, 2024 Take 1 capsule (1,000 Units total) by  mouth daily.       STOP taking these medications    omeprazole  20 MG capsule Commonly known as: PRILOSEC Replaced by: pantoprazole  40 MG tablet       TAKE these medications    abiraterone acetate 250 MG tablet Commonly known as: ZYTIGA Take 1,000 mg by mouth daily.   Accu-Chek Softclix Lancet Dev Kit USE TO CHECK BLOOD SUGAR IN THE MORNING, AT NOON, AND AT BEDTIME   Accu-Chek Softclix Lancets lancets SMARTSIG:Topical   amoxicillin -clavulanate 875-125 MG tablet Commonly known as: AUGMENTIN  Take 1 tablet by mouth 2 (two) times daily for 5 days.   aspirin  EC 81 MG tablet Take 1 tablet (81 mg total) by mouth daily. Swallow whole.   Blood Glucose Monitoring Suppl  Devi 1 each by Does not apply route in the morning, at noon, and at bedtime. E11.69. May substitute to any manufacturer covered by patient's insurance.   Calcium  600+D Plus Minerals 600-400 MG-UNIT Tabs Take 600 mg by mouth daily.   Clinpro 5000 1.1 % Pste Generic drug: Sodium Fluoride Place onto teeth.   clopidogrel  75 MG tablet Commonly known as: PLAVIX  TAKE 1 TABLET BY MOUTH EVERY DAY   dexamethasone  4 MG tablet Commonly known as: DECADRON  Take 2 tabs by mouth 2 times daily starting day before chemo. Then take 2 tabs daily for 2 days starting day after chemo. Take with food.   doxycycline  100 MG tablet Commonly known as: ADOXA Take 1 tablet (100 mg total) by mouth 2 (two) times daily for 5 days.   fluorouracil 5 % cream Commonly known as: EFUDEX Apply topically 2 (two) times daily.   fluticasone  50 MCG/ACT nasal spray Commonly known as: FLONASE  Place 1 spray into both nostrils daily as needed for allergies or rhinitis.   HYDROcodone -acetaminophen  5-325 MG tablet Commonly known as: NORCO/VICODIN Take 1-2 tablets by mouth every 6 (six) hours as needed for moderate pain (pain score 4-6).   lidocaine -prilocaine  cream Commonly known as: EMLA  Apply to affected area once   multivitamin tablet Take 1 tablet by mouth daily with breakfast.   nystatin  100000 UNIT/ML suspension Commonly known as: MYCOSTATIN  Take 5 mLs (500,000 Units total) by mouth 4 (four) times daily.   ondansetron  8 MG tablet Commonly known as: Zofran  Take 1 tablet (8 mg total) by mouth every 8 (eight) hours as needed for nausea or vomiting.   pantoprazole  40 MG tablet Commonly known as: PROTONIX  Take 1 tablet (40 mg total) by mouth daily. Start taking on: July 04, 2024 Replaces: omeprazole  20 MG capsule   potassium chloride  10 MEQ tablet Commonly known as: KLOR-CON  Take 1 tablet (10 mEq total) by mouth as needed.   predniSONE  5 MG tablet Commonly known as: DELTASONE  Take 5 mg by mouth  daily.   predniSONE  5 MG tablet Commonly known as: DELTASONE  Take 1 tablet (5 mg total) by mouth in the morning and at bedtime.   prochlorperazine  10 MG tablet Commonly known as: COMPAZINE  Take 1 tablet (10 mg total) by mouth every 6 (six) hours as needed for nausea or vomiting.   rosuvastatin  5 MG tablet Commonly known as: CRESTOR  TAKE 1 TABLET (5 MG TOTAL) BY MOUTH DAILY.   tamsulosin  0.4 MG Caps capsule Commonly known as: FLOMAX  Take 0.4 mg by mouth 2 (two) times daily.   Vitamin D  (Ergocalciferol ) 1.25 MG (50000 UNIT) Caps capsule Commonly known as: DRISDOL  Take 1 capsule (50,000 Units total) by mouth every 7 (seven) days. Start taking on: July 10, 2024  Allergies[1] Discharge Instructions     Call MD for:  difficulty breathing, headache or visual disturbances   Complete by: As directed    Call MD for:  extreme fatigue   Complete by: As directed    Call MD for:  persistant dizziness or light-headedness   Complete by: As directed    Call MD for:  severe uncontrolled pain   Complete by: As directed    Call MD for:  temperature >100.4   Complete by: As directed    Discharge instructions   Complete by: As directed    F/u with PCP in 1 wk F/u with Oncologist in 1 wk CBC with diff in 1 wk Vit d level in btw 3-6 months   Increase activity slowly   Complete by: As directed        The results of significant diagnostics from this hospitalization (including imaging, microbiology, ancillary and laboratory) are listed below for reference.    Significant Diagnostic Studies: US  Venous Img Lower Bilateral (DVT) Result Date: 07/02/2024 CLINICAL DATA:  Lower extremity edema. EXAM: BILATERAL LOWER EXTREMITY VENOUS DOPPLER ULTRASOUND TECHNIQUE: Gray-scale sonography with graded compression, as well as color Doppler and duplex ultrasound were performed to evaluate the lower extremity deep venous systems from the level of the common femoral vein and including the common  femoral, femoral, profunda femoral, popliteal and calf veins including the posterior tibial, peroneal and gastrocnemius veins when visible. The superficial great saphenous vein was also interrogated. Spectral Doppler was utilized to evaluate flow at rest and with distal augmentation maneuvers in the common femoral, femoral and popliteal veins. COMPARISON:  None Available. FINDINGS: RIGHT LOWER EXTREMITY Common Femoral Vein: No evidence of thrombus. Normal compressibility, respiratory phasicity and response to augmentation. Saphenofemoral Junction: No evidence of thrombus. Normal compressibility and flow on color Doppler imaging. Profunda Femoral Vein: No evidence of thrombus. Normal compressibility and flow on color Doppler imaging. Femoral Vein: No evidence of thrombus. Normal compressibility, respiratory phasicity and response to augmentation. Popliteal Vein: No evidence of thrombus. Normal compressibility, respiratory phasicity and response to augmentation. Calf Veins: No evidence of thrombus. Normal compressibility and flow on color Doppler imaging. Superficial Great Saphenous Vein: No evidence of thrombus. Normal compressibility. Venous Reflux:  None. Other Findings: No evidence of superficial thrombophlebitis. Fluid collection in the right popliteal fossa measures roughly 3.5 x 4 x 1.5 cm and is consistent with a Baker's cyst. LEFT LOWER EXTREMITY Common Femoral Vein: No evidence of thrombus. Normal compressibility, respiratory phasicity and response to augmentation. Saphenofemoral Junction: No evidence of thrombus. Normal compressibility and flow on color Doppler imaging. Profunda Femoral Vein: No evidence of thrombus. Normal compressibility and flow on color Doppler imaging. Femoral Vein: No evidence of thrombus. Normal compressibility, respiratory phasicity and response to augmentation. Popliteal Vein: No evidence of thrombus. Normal compressibility, respiratory phasicity and response to augmentation. Calf  Veins: No evidence of thrombus. Normal compressibility and flow on color Doppler imaging. Superficial Great Saphenous Vein: No evidence of thrombus. Normal compressibility. Venous Reflux:  None. Other Findings: No evidence of superficial thrombophlebitis or abnormal fluid collection. IMPRESSION: 1. No evidence of deep venous thrombosis in either lower extremity. 2. Right popliteal fossa Baker's cyst. Electronically Signed   By: Marcey Moan M.D.   On: 07/02/2024 10:55   MR LUMBAR SPINE WO CONTRAST Result Date: 07/02/2024 CLINICAL DATA:  Initial evaluation for compression fracture. EXAM: MRI LUMBAR SPINE WITHOUT CONTRAST TECHNIQUE: Multiplanar, multisequence MR imaging of the lumbar spine was performed. No intravenous contrast was administered. COMPARISON:  CT from 07/01/2024. FINDINGS: Segmentation: Standard. Lowest well-formed disc space labeled the L5-S1 level. Alignment: Mild dextroscoliosis. 3 mm retrolisthesis of L1 on L2, with trace 2 mm anterolisthesis of L5 on S1. Vertebrae: Multiple scattered marrow replacing lesions seen throughout the visualized lumbar spine, with involvement of the T12, L1, L2, and L5 vertebral bodies, consistent with metastatic disease. Extension to involve the posterior elements on the right at L1 and bilaterally at L5 and S1. Diffuse involvement of the visualized sacrum and posterior right greater than left iliac wings. Associated pathologic compression fracture at the inferior endplate of T12 with 40% height loss without significant retropulsion. Additional pathologic compression fracture involving the superior endplate of L1 with mild 10% height loss without retropulsion. Additional pathologic compression fracture at the superior endplate of L2 with up to 40% height loss without retropulsion. Vertebral body height otherwise maintained. Minimal epidural extension of tumor into the right greater than left ventral epidural space at the level of L1 (series 8, image 12). No other  significant extra osseous or epidural extension of tumor. Conus medullaris and cauda equina: Conus extends to the L1 level. Conus and cauda equina appear normal. Paraspinal and other soft tissues: Paraspinous soft tissues demonstrate no acute finding. Few scattered T2 hyperintense cyst noted about the visualized kidneys, benign in appearance, no follow-up imaging recommended. Retroaortic left renal vein noted. Disc levels: T11-12: Mild disc bulge with endplate spurring. Right greater than left facet hypertrophy. No spinal stenosis. Mild to moderate bilateral foraminal narrowing, worse on the left. T12-L1: Mild right eccentric disc bulge with endplate spurring. Mild bilateral facet hypertrophy. No spinal stenosis. Foramina remain patent. L1-2: Intervertebral disc space narrowing with trace retrolisthesis. Mild disc bulge with reactive endplate spurring, worse on the right. Mild facet hypertrophy. No significant spinal stenosis. Mild right L1 foraminal narrowing. Left neural foramina remains patent. L2-3: Degenerative vertebral disc space narrowing with diffuse disc bulge. Reactive endplate spurring. Disc bulging asymmetric to the left with superimposed left foraminal to extraforaminal disc protrusion (series 8, image 21). Mild bilateral facet hypertrophy. No significant spinal stenosis. Mild left L2 foraminal narrowing. Right neural foramen remains patent. L3-4: Degenerative intervertebral disc space narrowing with diffuse disc bulge, asymmetric to the left. Reactive endplate spurring, also greater on the left. Mild to moderate facet and ligament flavum hypertrophy. Resultant mild canal with moderate left lateral recess stenosis. Mild right with moderate left L3 foraminal narrowing. L4-5: Disc desiccation with mild diffuse disc bulge. Reactive endplate spurring, worse on the left. Mild bilateral facet and ligament flavum hypertrophy. No significant spinal stenosis. Foramina remain patent. L5-S1: Degenerative  intervertebral disc space narrowing with disc desiccation diffuse disc bulge. Reactive endplate spurring, worse on the right. Mild left with moderate right facet hypertrophy. Mild epidural lipomatosis. Resultant mild narrowing of the right lateral recess. Central canal remains patent. Moderate to severe right L5 foraminal stenosis. Left neural foramen remains patent. IMPRESSION: 1. Multifocal osseous metastases involving the lumbar spine as well as the visualized sacrum and pelvis. 2. Associated pathologic compression fractures involving the inferior endplate of T12 as well as the superior endplates of L1 and L2. Associated height loss of up to 40% without retropulsion. 3. Minimal epidural extension of tumor into the right greater than left ventral epidural space at the level of L1. No other significant extra osseous or epidural extension of tumor. 4. Underlying multilevel degenerative spondylosis and facet arthrosis as above. Resultant mild canal with moderate left lateral recess stenosis at L3-4, with moderate left L3 foraminal narrowing. 5.  Moderate to severe right L5 foraminal narrowing related to disc bulge, reactive endplate change, and facet degeneration Electronically Signed   By: Morene Hoard M.D.   On: 07/02/2024 00:54   CT ABDOMEN PELVIS W CONTRAST Result Date: 07/01/2024 CLINICAL DATA:  Lower abdominal pain, neutropenic fever. Patient on chemotherapy for prostate cancer with lung mass. EXAM: CT ABDOMEN AND PELVIS WITH CONTRAST TECHNIQUE: Multidetector CT imaging of the abdomen and pelvis was performed using the standard protocol following bolus administration of intravenous contrast. RADIATION DOSE REDUCTION: This exam was performed according to the departmental dose-optimization program which includes automated exposure control, adjustment of the mA and/or kV according to patient size and/or use of iterative reconstruction technique. CONTRAST:  OMNIPAQUE  IOHEXOL  300 MG/ML  SOLN  COMPARISON:  04/25/2024, 04/11/2024. FINDINGS: Lower chest: The heart is mildly enlarged and multi-vessel coronary artery calcifications are noted. There is a small left pleural effusion with a associated atelectasis or infiltrate. Multiple nodules are identified at the right lung base, increased in number from the prior exam. The left lung base is obscured due to associated infiltrates. Hepatobiliary: No focal abnormality in the liver. The liver has a nodular contour suggesting underlying cirrhosis. Fatty infiltration is also seen. No biliary ductal dilatation. The gallbladder is without stones. Pancreas: Unremarkable. No pancreatic ductal dilatation or surrounding inflammatory changes. Spleen: Normal in size without focal abnormality. Adrenals/Urinary Tract: The adrenal glands are within normal limits. The kidneys enhance symmetrically. A stable hypodensity is noted in the left kidney, suggesting cyst. No renal calculus or obstructive uropathy bilaterally. The bladder is stable. Stomach/Bowel: The stomach is within normal limits. No bowel obstruction, free air, or pneumatosis is seen. A moderate amount of retained stool is present in the colon. There is mild thickening of the walls of the transverse and descending colon. Appendix appears normal. Vascular/Lymphatic: Aortic atherosclerosis. No enlarged abdominal or pelvic lymph nodes. Reproductive: Prostate is unremarkable. Other: Rim calcified structures are seen in the pelvis suggesting fat necrosis. There is trace ascites in the right pericolic gutter. Musculoskeletal: Degenerative changes are present in the thoracolumbar spine. Sclerotic changes are noted in the visualized thoracolumbar spine, sacrum, and pelvis. A lytic lesion with bony discontinuity is noted in the inferior pubic ramus on the left with likely associated soft tissue component. There is a new compression deformity in the superior endplate at L2 with loss of vertebral body height of 10%  centrally, likely pathologic fracture. IMPRESSION: 1. Mild colonic wall thickening involving the transverse and descending colon, possible colitis. 2. Increased airspace disease at the left lung base, possible atelectasis or infiltrate. 3. Small left pleural effusion. 4. Osteoblastic metastasis in the thoracolumbar spine, sacrum, and pelvis. A new compression deformity in the superior endplate at L2, likely pathologic fracture. 5. Increased number of pulmonary nodules, suspicious for metastatic disease. 6. Hepatic steatosis with morphologic changes in the liver suggestive of cirrhosis. 7. Aortic atherosclerosis and coronary artery calcifications. Electronically Signed   By: Leita Birmingham M.D.   On: 07/01/2024 16:53   DG Chest Port 1 View Result Date: 07/01/2024 CLINICAL DATA:  Fever, chemotherapy patient. EXAM: PORTABLE CHEST 1 VIEW COMPARISON:  11/02/2022, 04/11/2024. FINDINGS: The heart is borderline enlarged and the mediastinal contour is within normal limits. There is atherosclerotic calcification of the aorta. Interstitial prominence is noted bilaterally. A few scattered nodular densities are seen bilaterally. There are suspected trace bilateral pleural effusions. No pneumothorax is seen. A left chest port terminates in the right atrium. IMPRESSION: 1. Interstitial prominence bilaterally with scattered  nodular opacities, unchanged from the previous exam. 2. Suspected small bilateral pleural effusions. Electronically Signed   By: Leita Birmingham M.D.   On: 07/01/2024 15:24    Microbiology: Recent Results (from the past 240 hours)  Culture, blood (Routine x 2)     Status: None (Preliminary result)   Collection Time: 07/01/24  1:00 PM   Specimen: BLOOD  Result Value Ref Range Status   Specimen Description BLOOD LEFT ANTECUBITAL  Final   Special Requests   Final    BOTTLES DRAWN AEROBIC AND ANAEROBIC Blood Culture adequate volume   Culture   Final    NO GROWTH 2 DAYS Performed at [facility], [address], [place], [place] [address]    Report Status PENDING  Incomplete  Culture, blood (Routine x 2)     Status: None (Preliminary result)   Collection Time: 07/01/24  1:58 PM   Specimen: BLOOD  Result Value Ref Range Status   Specimen Description BLOOD RIGHT ANTECUBITAL  Final   Special Requests   Final    BOTTLES DRAWN AEROBIC AND ANAEROBIC Blood Culture adequate volume   Culture   Final    NO GROWTH 2 DAYS Performed at [facility], [address], [place], [place] [address]    Report Status PENDING  Incomplete  Resp panel by RT-PCR (RSV, Flu A&B, Covid) Anterior Nasal Swab     Status: None   Collection Time: 07/01/24  2:12 PM   Specimen: Anterior Nasal Swab  Result Value Ref Range Status   SARS Coronavirus 2 by RT PCR NEGATIVE NEGATIVE Final    Comment: (NOTE) SARS-CoV-2 target nucleic acids are NOT DETECTED.  The SARS-CoV-2 RNA is generally detectable in upper respiratory specimens during the acute phase of infection. The lowest concentration of SARS-CoV-2 viral copies this assay can detect is 138 copies/mL. A negative result does not preclude SARS-Cov-2 infection and should not be used as the sole basis for treatment or other patient management decisions. A negative result may occur with  improper specimen collection/handling, submission of specimen other than nasopharyngeal swab, presence of viral mutation(s) within the areas targeted by this assay, and inadequate number of viral copies(<138 copies/mL). A negative result must be combined with clinical observations, patient history, and epidemiological information. The expected result is Negative.  Fact Sheet for Patients:  bloggercourse.com  Fact Sheet for Healthcare Providers:  seriousbroker.it  This test is no t yet approved or cleared by the [place]  FDA and  has been authorized for detection and/or diagnosis of SARS-CoV-2 by FDA under  an Emergency Use Authorization (EUA). This EUA will remain  in effect (meaning this test can be used) for the duration of the COVID-19 declaration under Section 564(b)(1) of the Act, 21 U.S.C.section 360bbb-3(b)(1), unless the authorization is terminated  or revoked sooner.       Influenza A by PCR NEGATIVE NEGATIVE Final   Influenza B by PCR NEGATIVE NEGATIVE Final    Comment: (NOTE) The Xpert Xpress SARS-CoV-2/FLU/RSV plus assay is intended as an aid in the diagnosis of influenza from Nasopharyngeal swab specimens and should not be used as a sole basis for treatment. Nasal washings and aspirates are unacceptable for Xpert Xpress SARS-CoV-2/FLU/RSV testing.  Fact Sheet for Patients: bloggercourse.com  Fact Sheet for Healthcare Providers: seriousbroker.it  This test is not yet approved or cleared by the [place]  FDA and has been authorized for detection and/or diagnosis of SARS-CoV-2 by FDA under an Emergency Use Authorization (EUA). This EUA will  remain in effect (meaning this test can be used) for the duration of the COVID-19 declaration under Section 564(b)(1) of the Act, 21 U.S.C. section 360bbb-3(b)(1), unless the authorization is terminated or revoked.     Resp Syncytial Virus by PCR NEGATIVE NEGATIVE Final    Comment: (NOTE) Fact Sheet for Patients: bloggercourse.com  Fact Sheet for Healthcare Providers: seriousbroker.it  This test is not yet approved or cleared by the [place]  FDA and has been authorized for detection and/or diagnosis of SARS-CoV-2 by FDA under an Emergency Use Authorization (EUA). This EUA will remain in effect (meaning this test can be used) for the duration of the COVID-19 declaration under Section 564(b)(1) of the Act, 21 U.S.C. section 360bbb-3(b)(1), unless the authorization is terminated or revoked.  Performed at [facility], [address] [place], [place] [address]      Labs: CBC: Recent Labs  Lab 06/26/24 1102 07/01/24 1300 07/02/24 0558 07/02/24 0943 07/03/24 0815  WBC 6.5 0.4* 0.6* 0.6*  0.6* 3.9*  3.7*  NEUTROABS 5.8 0.1*  --  0.1* 2.6  HGB 11.0* 12.1* 10.0* 10.7*  10.6* 9.5*  9.5*  HCT 31.4* 35.5* 28.7* 30.7*  30.2* 27.2*  27.6*  MCV 99.7 101.7* 100.0 100.3*  100.0 100.7*  101.1*  PLT 157 95* 67* 70*  72* 59*  56*   Basic Metabolic Panel: Recent Labs  Lab 06/26/24 1102 07/01/24 1300 07/02/24 0558 07/03/24 0815  NA 139 128* 134* 134*  K 3.9 4.1 3.7 3.7  CL 104 95* 102 104  CO2 22 22 25 22   GLUCOSE 181* 121* 118* 110*  BUN 20 22 15 13   CREATININE 0.81 0.78 0.71 0.78  CALCIUM  9.2 8.5* 8.3* 8.3*  MG  --  1.7  --  1.8  PHOS  --   --   --  2.1*   Liver Function Tests: Recent Labs  Lab 06/26/24 1102 07/01/24 1300  AST 33 30  ALT 25 22  ALKPHOS 231* 174*  BILITOT 0.6 1.2  PROT 6.7 6.0*  ALBUMIN 4.0 3.7   No results for input(s): LIPASE, AMYLASE in the last 168 hours. No results for input(s): AMMONIA in the last 168 hours. Cardiac Enzymes: No results for input(s): CKTOTAL, CKMB, CKMBINDEX, TROPONINI in the last 168 hours. BNP (last 3 results) No results for input(s): BNP in the last 8760 hours. CBG: Recent Labs  Lab 07/02/24 1000 07/02/24 1150 07/02/24 1620 07/02/24 2111 07/03/24 0844  GLUCAP 127* 114* 128* 141* 98    Time spent: 35 minutes  Signed:  Elvan Sor  [facility] 07/03/2024 10:56 AM      [1] No Known Allergies

## 2024-07-03 NOTE — Progress Notes (Signed)
 Mobility Specialist Progress Note:    07/03/24 1215  Mobility  Activity Ambulated with assistance  Level of Assistance Standby assist, set-up cues, supervision of patient - no hands on  Assistive Device None  Distance Ambulated (ft) 70 ft  Range of Motion/Exercises Active;All extremities  Activity Response Tolerated well  Mobility visit 1 Mobility  Mobility Specialist Start Time (ACUTE ONLY) 1202  Mobility Specialist Stop Time (ACUTE ONLY) 1214  Mobility Specialist Time Calculation (min) (ACUTE ONLY) 12 min   Pt received in bed, agreeable to mobility. Required supervision to stand and ambulate with no AD. Tolerated well, pt utilized furniture surfing during session. Returned pt fowlers, all needs met.   Sherrilee Ditty Mobility Specialist Please contact via Special Educational Needs Teacher or  Rehab office at [phone]

## 2024-07-04 ENCOUNTER — Telehealth: Payer: Self-pay

## 2024-07-04 ENCOUNTER — Telehealth: Payer: Self-pay | Admitting: Medical Oncology

## 2024-07-04 ENCOUNTER — Inpatient Hospital Stay: Attending: Internal Medicine

## 2024-07-04 DIAGNOSIS — Z923 Personal history of irradiation: Secondary | ICD-10-CM | POA: Diagnosis not present

## 2024-07-04 DIAGNOSIS — C7951 Secondary malignant neoplasm of bone: Secondary | ICD-10-CM | POA: Diagnosis present

## 2024-07-04 DIAGNOSIS — C61 Malignant neoplasm of prostate: Secondary | ICD-10-CM | POA: Insufficient documentation

## 2024-07-04 DIAGNOSIS — Z79899 Other long term (current) drug therapy: Secondary | ICD-10-CM | POA: Diagnosis not present

## 2024-07-04 DIAGNOSIS — C7A1 Malignant poorly differentiated neuroendocrine tumors: Secondary | ICD-10-CM

## 2024-07-04 DIAGNOSIS — Z9221 Personal history of antineoplastic chemotherapy: Secondary | ICD-10-CM | POA: Diagnosis not present

## 2024-07-04 DIAGNOSIS — Z7952 Long term (current) use of systemic steroids: Secondary | ICD-10-CM | POA: Insufficient documentation

## 2024-07-04 LAB — CMP (CANCER CENTER ONLY)
ALT: 21 U/L (ref 0–44)
AST: 44 U/L — ABNORMAL HIGH (ref 15–41)
Albumin: 3.4 g/dL — ABNORMAL LOW (ref 3.5–5.0)
Alkaline Phosphatase: 168 U/L — ABNORMAL HIGH (ref 38–126)
Anion gap: 9 (ref 5–15)
BUN: 12 mg/dL (ref 8–23)
CO2: 22 mmol/L (ref 22–32)
Calcium: 8.5 mg/dL — ABNORMAL LOW (ref 8.9–10.3)
Chloride: 102 mmol/L (ref 98–111)
Creatinine: 0.79 mg/dL (ref 0.61–1.24)
GFR, Estimated: >60 mL/min
Glucose, Bld: 120 mg/dL — ABNORMAL HIGH (ref 70–99)
Potassium: 3.4 mmol/L — ABNORMAL LOW (ref 3.5–5.1)
Sodium: 133 mmol/L — ABNORMAL LOW (ref 135–145)
Total Bilirubin: 0.6 mg/dL (ref 0.0–1.2)
Total Protein: 5.7 g/dL — ABNORMAL LOW (ref 6.5–8.1)

## 2024-07-04 LAB — CBC WITH DIFFERENTIAL (CANCER CENTER ONLY)
Abs Immature Granulocytes: 1.77 K/uL — ABNORMAL HIGH (ref 0.00–0.07)
Basophils Absolute: 0 K/uL (ref 0.0–0.1)
Basophils Relative: 0 %
Eosinophils Absolute: 0.1 K/uL (ref 0.0–0.5)
Eosinophils Relative: 1 %
HCT: 28.4 % — ABNORMAL LOW (ref 39.0–52.0)
Hemoglobin: 10 g/dL — ABNORMAL LOW (ref 13.0–17.0)
Immature Granulocytes: 12 %
Lymphocytes Relative: 4 %
Lymphs Abs: 0.6 K/uL — ABNORMAL LOW (ref 0.7–4.0)
MCH: 34.7 pg — ABNORMAL HIGH (ref 26.0–34.0)
MCHC: 35.2 g/dL (ref 30.0–36.0)
MCV: 98.6 fL (ref 80.0–100.0)
Monocytes Absolute: 0.8 K/uL (ref 0.1–1.0)
Monocytes Relative: 6 %
Neutro Abs: 11.3 K/uL — ABNORMAL HIGH (ref 1.7–7.7)
Neutrophils Relative %: 77 %
Platelet Count: 69 K/uL — ABNORMAL LOW (ref 150–400)
RBC: 2.88 MIL/uL — ABNORMAL LOW (ref 4.22–5.81)
RDW: 13.6 % (ref 11.5–15.5)
Smear Review: NORMAL
WBC Count: 14.5 K/uL — ABNORMAL HIGH (ref 4.0–10.5)
nRBC: 0.3 % — ABNORMAL HIGH (ref 0.0–0.2)

## 2024-07-04 NOTE — Telephone Encounter (Signed)
 Can he resume his Zytiga?  D/C from hospital yesterday .  Next f/u 01/19.

## 2024-07-04 NOTE — Transitions of Care (Post Inpatient/ED Visit) (Signed)
 "  07/04/2024  Name: Larry Kent MRN: [id number] DOB: 1946/01/14  Today's TOC FU Call Status: Today's TOC FU Call Status:: Successful TOC FU Call Completed TOC FU Call Complete Date: 07/04/24  Patient's Name and Date of Birth confirmed. Name, DOB  Transition Care Management Follow-up Telephone Call Date of Discharge: 07/03/24 Discharge Facility: [facility]) Type of Discharge: Inpatient Admission Primary Inpatient Discharge Diagnosis:: compression fracture How have you been since you were released from the hospital?: Better Any questions or concerns?: No  Items Reviewed: Did you receive and understand the discharge instructions provided?: Yes Medications obtained,verified, and reconciled?: Yes (Medications Reviewed) Any new allergies since your discharge?: No Do you have support at home?: Yes People in Home [RPT]: child(ren), adult  Medications Reviewed Today: Medications Reviewed Today     Reviewed by Emmitt Pan, LPN (Licensed Practical Nurse) on 07/04/24 at 1421  Med List Status: <None>   Medication Order Taking? Sig Documenting Provider Last Dose Status Informant  abiraterone acetate (ZYTIGA) 250 MG tablet [id number] Yes Take 1,000 mg by mouth daily. [provider]  Active   Accu-Chek Softclix Lancets lancets [id number] Yes SMARTSIG:Topical [provider]  Active   amoxicillin -clavulanate (AUGMENTIN ) 875-125 MG tablet [id number] Yes Take 1 tablet by mouth 2 (two) times daily for 5 days. Von Bellis, MD  Active   aspirin  EC 81 MG tablet [id number] Yes Take 1 tablet (81 mg total) by mouth daily. Swallow whole. Darron Deatrice LABOR, MD  Active   Blood Glucose Monitoring Suppl DEVI [id number] Yes 1 each by Does not apply route in the morning, at noon, and at bedtime. E11.69. May substitute to any manufacturer covered by patient's insurance. [name], MD  Active   Calcium  Carbonate-Vit D-Min (CALCIUM  600+D PLUS MINERALS)  600-400 MG-UNIT TABS [id number] Yes Take 600 mg by mouth daily. [provider]  Active   Cholecalciferol (VITAMIN D3) 25 MCG (1000 UT) CAPS [id number]  Take 1 capsule (1,000 Units total) by mouth daily.  Patient not taking: Reported on 07/04/2024   [name], MD  Active   CLINPRO 5000 1.1 % PSTE [id number] Yes Place onto teeth. [provider]  Active   clopidogrel  (PLAVIX ) 75 MG tablet [id number] Yes TAKE 1 TABLET BY MOUTH EVERY DAY Arida, Muhammad A, MD  Active   dexamethasone  (DECADRON ) 4 MG tablet [id number] Yes Take 2 tabs by mouth 2 times daily starting day before chemo. Then take 2 tabs daily for 2 days starting day after chemo. Take with food. Sherrod Sherrod, MD  Active   doxycycline  (ADOXA) 100 MG tablet [id number] Yes Take 1 tablet (100 mg total) by mouth 2 (two) times daily for 5 days. Von Bellis, MD  Active   fluorouracil (EFUDEX) 5 % cream [id number] Yes Apply topically 2 (two) times daily. [provider]  Active   fluticasone  (FLONASE ) 50 MCG/ACT nasal spray [id number] Yes Place 1 spray into both nostrils daily as needed for allergies or rhinitis. [provider]  Active Self  HYDROcodone -acetaminophen  (NORCO/VICODIN) 5-325 MG tablet [id number] Yes Take 1-2 tablets by mouth every 6 (six) hours as needed for moderate pain (pain score 4-6). Wyatt Leeroy HERO, PA-C  Active   Lancets Misc. (ACCU-CHEK SOFTCLIX LANCET DEV) PRESSLEY [id number] Yes USE TO CHECK BLOOD SUGAR IN THE MORNING, AT NOON, AND AT BEDTIME [provider]  Active   lidocaine -prilocaine  (EMLA ) cream [id number] Yes Apply to affected area once Mohamed, Mohamed, MD  Active   Multiple Vitamin (MULTIVITAMIN) tablet [id number] Yes Take  1 tablet by mouth daily with breakfast. [provider]  Active Self           Med Note MARISA, NATHANEL SAILOR   Tue Aug 18, 2022  6:08 PM)    nystatin  (MYCOSTATIN ) 100000 UNIT/ML suspension [id number] Yes Take 5 mLs (500,000 Units total) by mouth 4 (four) times  daily. Von Bellis, MD  Active   ondansetron  (ZOFRAN ) 8 MG tablet [id number] Yes Take 1 tablet (8 mg total) by mouth every 8 (eight) hours as needed for nausea or vomiting. Sherrod Sherrod, MD  Active   pantoprazole  (PROTONIX ) 40 MG tablet [id number] Yes Take 1 tablet (40 mg total) by mouth daily. Von Bellis, MD  Active   potassium chloride  (KLOR-CON ) 10 MEQ tablet [id number] Yes Take 1 tablet (10 mEq total) by mouth as needed. Emelia Josefa HERO, NP  Active   predniSONE  (DELTASONE ) 5 MG tablet [id number] Yes Take 5 mg by mouth daily. [provider]  Active   predniSONE  (DELTASONE ) 5 MG tablet [id number] Yes Take 1 tablet (5 mg total) by mouth in the morning and at bedtime. Sherrod Sherrod, MD  Active   prochlorperazine  (COMPAZINE ) 10 MG tablet [id number] Yes Take 1 tablet (10 mg total) by mouth every 6 (six) hours as needed for nausea or vomiting. Sherrod Sherrod, MD  Active   rosuvastatin  (CRESTOR ) 5 MG tablet [id number] Yes TAKE 1 TABLET (5 MG TOTAL) BY MOUTH DAILY. Darron Deatrice LABOR, MD  Active   tamsulosin  (FLOMAX ) 0.4 MG CAPS capsule [id number] Yes Take 0.4 mg by mouth 2 (two) times daily. [provider]  Active Self  Vitamin D , Ergocalciferol , (DRISDOL ) 1.25 MG (50000 UNIT) CAPS capsule [id number] Yes Take 1 capsule (50,000 Units total) by mouth every 7 (seven) days. Von Bellis, MD  Active             Home Care and Equipment/Supplies: Were Home Health Services Ordered?: Yes Name of Home Health Agency:: unknown Has Agency set up a time to come to your home?: Yes First Home Health Visit Date: 07/10/24 Any new equipment or medical supplies ordered?: NA  Functional Questionnaire: Do you need assistance with bathing/showering or dressing?: No Do you need assistance with meal preparation?: No Do you need assistance with eating?: No Do you have difficulty maintaining continence: No Do you need assistance with getting out of bed/getting out of a chair/moving?: No Do  you have difficulty managing or taking your medications?: No  Follow up appointments reviewed: PCP Follow-up appointment confirmed?: Yes Date of PCP follow-up appointment?: 07/11/24 Follow-up Provider: [facility] Follow-up appointment confirmed?: NA Do you need transportation to your follow-up appointment?: No Do you understand care options if your condition(s) worsen?: Yes-patient verbalized understanding    SIGNATURE Julian Lemmings, LPN [facility] Nurse Health Advisor Direct Dial [phone]  "

## 2024-07-05 NOTE — Telephone Encounter (Signed)
 LVM for pt to start Zytiga per Dr. Sherrod.

## 2024-07-06 ENCOUNTER — Encounter: Payer: Self-pay | Admitting: Nurse Practitioner

## 2024-07-06 ENCOUNTER — Inpatient Hospital Stay: Attending: Internal Medicine | Admitting: Nurse Practitioner

## 2024-07-06 VITALS — BP 127/86 | HR 100 | Temp 98.4°F | Resp 16 | Wt 191.3 lb

## 2024-07-06 DIAGNOSIS — Z515 Encounter for palliative care: Secondary | ICD-10-CM | POA: Diagnosis not present

## 2024-07-06 DIAGNOSIS — B370 Candidal stomatitis: Secondary | ICD-10-CM

## 2024-07-06 DIAGNOSIS — G893 Neoplasm related pain (acute) (chronic): Secondary | ICD-10-CM | POA: Diagnosis not present

## 2024-07-06 DIAGNOSIS — R530 Neoplastic (malignant) related fatigue: Secondary | ICD-10-CM | POA: Diagnosis not present

## 2024-07-06 DIAGNOSIS — K648 Other hemorrhoids: Secondary | ICD-10-CM | POA: Diagnosis not present

## 2024-07-06 DIAGNOSIS — K5900 Constipation, unspecified: Secondary | ICD-10-CM | POA: Diagnosis not present

## 2024-07-06 DIAGNOSIS — C61 Malignant neoplasm of prostate: Secondary | ICD-10-CM | POA: Diagnosis not present

## 2024-07-06 MED ORDER — NYSTATIN 100000 UNIT/ML MT SUSP: 5.0000 mL | Freq: Three times a day (TID) | OROMUCOSAL | 1 refills | Status: DC

## 2024-07-06 MED ORDER — HYDROCODONE-ACETAMINOPHEN 5-325 MG PO TABS: 1.0000 | ORAL_TABLET | Freq: Four times a day (QID) | ORAL | 0 refills | Status: AC | PRN

## 2024-07-06 NOTE — Progress Notes (Signed)
 "    Palliative Medicine [facility]  [phone]) [phone] [phone]   Name: Larry Kent Date: 07/06/2024 MRN: [id number]  DOB: [birth year]  Patient Care Team: [name], MD as PCP - General (Family Medicine) Sherrod Sherrod, MD as Consulting Physician (Oncology) Fleeta Rothman, Jomarie SAILOR, MD as Consulting Physician (Infectious Diseases) Gretta Lonni PARAS, MD as Consulting Physician (Vascular Surgery) [facility] Darron Deatrice LABOR, MD as Consulting Physician (Cardiology) [facility], [place] (Optometry) Pickenpack-Cousar, Fannie SAILOR, NP as Nurse Practitioner (Hospice and Palliative Medicine)    REASON FOR CONSULTATION: Larry Kent is a 79 y.o. male with oncologic medical history including metastatic poorly differentiated neuroendocrine carcinoma with suspicions of primary prostate versus primary lung cancer due to presentation of innumerable embolic lateral pulmonary nodules, right hilar and mediastinal lymphadenopathy, and metastatic disease to several areas of the bone.  Prostate adenocarcinoma (February 2024) currently receiving Eligard every 4 months in addition to Zytiga and Doxil Taxol every 3 weeks.  Patient is s/p palliative radiotherapy to L1 vertebral body and posterior iliac bone with last treatment on June 14, 2024. Palliative is seeing patient for symptom management and goals of care.    SOCIAL HISTORY:     reports that he has never smoked. He has never used smokeless tobacco. He reports that he does not currently use alcohol . He reports that he does not use drugs.  ADVANCE DIRECTIVES:  None on file  CODE STATUS: Full code  PAST MEDICAL HISTORY: Past Medical History:  Diagnosis Date   Allergy    Arthritis    both knees    BCC (basal cell carcinoma), arm, right 09/2019   MOHS ([place])   BCC (basal cell carcinoma), face 2014   L preauricular s/p MOHS   BCC (basal cell carcinoma), face 02/2018   L upper  lip   BPH (benign prostatic hypertrophy)    Dr. Nieves @ [facility]   Carotid stenosis 05/2015   RICA 40-59%, LICA 1-39%, L vertebral occlusion, rpt 1 yr   Cataract    removed both eyes    Diabetes mellitus without complication (HCC)    FHx: colon cancer    FHx: prostate cancer    Heart murmur    mild aortic stenosis    History of kidney stones    Hyperlipidemia    borderline- on rosuvastatin  now normal    Lung cancer (HCC) 07/2022   Myocardial infarction (HCC) 09/07/2022   Occlusion of right vertebral artery 05/2015   Prostate cancer (HCC) 07/2022    PAST SURGICAL HISTORY:  Past Surgical History:  Procedure Laterality Date   BASAL CELL CARCINOMA EXCISION  05/2018   lip, 2020 x3  basal cells removed    CATARACT EXTRACTION  2008   Left   CATARACT EXTRACTION  2013   Right (Eppes)   COLONOSCOPY  ~2010   medium int hemorrhoids, o/w WNL, rpt 5 yrs given fmhx (Dr. Celestia)   COLONOSCOPY  08/2019   TAs, diverticulosis, rpt 3 yrs (Armbruster)   COLONOSCOPY  12/2023   2TAs, diverticulosis, int hem, no f/u needed (Armbruster)   CORONARY/GRAFT ACUTE MI REVASCULARIZATION N/A 09/07/2022   Procedure: Coronary/Graft Acute MI Revascularization;  Surgeon: Thukkani, Arun K, MD;  Location: [facility] INV[facility]IVE [facility] LAB;  Service: Cardiovascular;  Laterality: N/A;   ENDARTERECTOMY Right 12/01/2019   Procedure: RIGHT ENDARTERECTOMY CAROTID;  Surgeon: Gretta Lonni PARAS, MD;  Location: [facility] OR;  Service: Vascular;  Laterality: Right;   exercise treadmill  11/2005   WNL [place])  FINE NEEDLE ASPIRATION  07/28/2022   Procedure: FINE NEEDLE ASPIRATION (FNA) LINEAR;  Surgeon: Brenna Adine CROME, DO;  Location: [facility] ENDOSCOPY;  Service: Pulmonary;;   HERNIA REPAIR  2008   Right   IR IMAGING GUIDED PORT INSERTION  08/11/2022   IR IMAGING GUIDED PORT INSERTION  10/08/2022   IR REMOVAL TUN ACCESS W/ PORT W/O FL MOD SED  09/10/2022   LEFT HEART CATH AND CORONARY ANGIOGRAPHY N/A 09/07/2022   Procedure: LEFT  HEART CATH AND CORONARY ANGIOGRAPHY;  Surgeon: Wendel Lurena POUR, MD;  Location: [facility] INVASIVE CV LAB;  Service: Cardiovascular;  Laterality: N/A;   MOHS SURGERY Left spring 2014   basal cell face   PATCH ANGIOPLASTY Right 12/01/2019   Procedure: PATCH ANGIOPLASTY USING XENOSURE BIOLOGIC PATCH;  Surgeon: Gretta Lonni PARAS, MD;  Location: [facility] OR;  Service: Vascular;  Laterality: Right;   Testicular Biopsy  2003   benign, varicocele   US  ECHOCARDIOGRAPHY  11/2005   aortic sclerosis, EF 55-60%, diastolic dysfunction   VIDEO BRONCHOSCOPY WITH ENDOBRONCHIAL ULTRASOUND Bilateral 07/28/2022   Procedure: VIDEO BRONCHOSCOPY WITH ENDOBRONCHIAL ULTRASOUND;  Surgeon: Brenna Adine CROME, DO;  Location: [facility] ENDOSCOPY;  Service: Pulmonary;  Laterality: Bilateral;    HEMATOLOGY/ONCOLOGY HISTORY:  Oncology History  Large cell neuroendocrine carcinoma (HCC)  08/03/2022 Initial Diagnosis   Large cell neuroendocrine carcinoma (HCC)   08/11/2022 - 02/23/2023 Chemotherapy   Patient is on Treatment Plan : LUNG NEUROENDOCRINE Carcinoma EXTENSIVE STAGE Durvalumab  + Carboplatin  D1 + Etoposide  D1-3 q21d x 4 Cycles / Durvalumab  q28d     06/26/2024 -  Chemotherapy   Patient is on Treatment Plan : PROSTATE Docetaxel  (75) + Prednisone  q21d       ALLERGIES:  has no known allergies.  MEDICATIONS:  Current Outpatient Medications  Medication Sig Dispense Refill   magic mouthwash (nystatin , diphenhydrAMINE , alum & mag hydroxide) suspension mixture Swish and spit 5 mLs 3 (three) times daily. 240 mL 1   abiraterone acetate (ZYTIGA) 250 MG tablet Take 1,000 mg by mouth daily.     Accu-Chek Softclix Lancets lancets SMARTSIG:Topical     amoxicillin -clavulanate (AUGMENTIN ) 875-125 MG tablet Take 1 tablet by mouth 2 (two) times daily for 5 days. 10 tablet 0   aspirin  EC 81 MG tablet Take 1 tablet (81 mg total) by mouth daily. Swallow whole.     Blood Glucose Monitoring Suppl DEVI 1 each by Does not apply route in the morning, at  noon, and at bedtime. E11.69. May substitute to any manufacturer covered by patient's insurance. 1 each 0   Calcium  Carbonate-Vit D-Min (CALCIUM  600+D PLUS MINERALS) 600-400 MG-UNIT TABS Take 600 mg by mouth daily.     [Paused] Cholecalciferol (VITAMIN D3) 25 MCG (1000 UT) CAPS Take 1 capsule (1,000 Units total) by mouth daily. (Patient not taking: Reported on 07/04/2024)     CLINPRO 5000 1.1 % PSTE Place onto teeth.     clopidogrel  (PLAVIX ) 75 MG tablet TAKE 1 TABLET BY MOUTH EVERY DAY 90 tablet 2   dexamethasone  (DECADRON ) 4 MG tablet Take 2 tabs by mouth 2 times daily starting day before chemo. Then take 2 tabs daily for 2 days starting day after chemo. Take with food. 30 tablet 1   doxycycline  (ADOXA) 100 MG tablet Take 1 tablet (100 mg total) by mouth 2 (two) times daily for 5 days. 10 tablet 0   fluorouracil (EFUDEX) 5 % cream Apply topically 2 (two) times daily.     fluticasone  (FLONASE ) 50 MCG/ACT nasal spray Place 1 spray  into both nostrils daily as needed for allergies or rhinitis.     HYDROcodone -acetaminophen  (NORCO/VICODIN) 5-325 MG tablet Take 1-2 tablets by mouth every 6 (six) hours as needed for moderate pain (pain score 4-6). 60 tablet 0   Lancets Misc. (ACCU-CHEK SOFTCLIX LANCET DEV) KIT USE TO CHECK BLOOD SUGAR IN THE MORNING, AT NOON, AND AT BEDTIME     lidocaine -prilocaine  (EMLA ) cream Apply to affected area once 30 g 3   Multiple Vitamin (MULTIVITAMIN) tablet Take 1 tablet by mouth daily with breakfast.     nystatin  (MYCOSTATIN ) 100000 UNIT/ML suspension Take 5 mLs (500,000 Units total) by mouth 4 (four) times daily. 60 mL 0   ondansetron  (ZOFRAN ) 8 MG tablet Take 1 tablet (8 mg total) by mouth every 8 (eight) hours as needed for nausea or vomiting. 30 tablet 1   pantoprazole  (PROTONIX ) 40 MG tablet Take 1 tablet (40 mg total) by mouth daily. 30 tablet 11   potassium chloride  (KLOR-CON ) 10 MEQ tablet Take 1 tablet (10 mEq total) by mouth as needed. 90 tablet 3   predniSONE   (DELTASONE ) 5 MG tablet Take 5 mg by mouth daily.     predniSONE  (DELTASONE ) 5 MG tablet Take 1 tablet (5 mg total) by mouth in the morning and at bedtime. 60 tablet 11   prochlorperazine  (COMPAZINE ) 10 MG tablet Take 1 tablet (10 mg total) by mouth every 6 (six) hours as needed for nausea or vomiting. 30 tablet 1   rosuvastatin  (CRESTOR ) 5 MG tablet TAKE 1 TABLET (5 MG TOTAL) BY MOUTH DAILY. 90 tablet 1   tamsulosin  (FLOMAX ) 0.4 MG CAPS capsule Take 0.4 mg by mouth 2 (two) times daily.     [START ON 07/10/2024] Vitamin D , Ergocalciferol , (DRISDOL ) 1.25 MG (50000 UNIT) CAPS capsule Take 1 capsule (50,000 Units total) by mouth every 7 (seven) days. 12 capsule 0   No current facility-administered medications for this visit.    VITAL SIGNS: BP 127/86 (BP Location: Right Arm, Patient Position: Sitting)   Pulse 100   Temp 98.4 F (36.9 C) (Temporal)   Resp 16   Wt 191 lb 5 oz (86.8 kg)   SpO2 97%   BMI 33.89 kg/m  Filed Weights   07/06/24 1016  Weight: 191 lb 5 oz (86.8 kg)    Estimated body mass index is 33.89 kg/m as calculated from the following:   Height as of 07/01/24: 5' 3 (1.6 m).   Weight as of this encounter: 191 lb 5 oz (86.8 kg).  LABS: CBC:    Component Value Date/Time   WBC 14.5 (H) 07/04/2024 1014   WBC 3.7 (L) 07/03/2024 0815   WBC 3.9 (L) 07/03/2024 0815   HGB 10.0 (L) 07/04/2024 1014   HCT 28.4 (L) 07/04/2024 1014   PLT 69 (L) 07/04/2024 1014   MCV 98.6 07/04/2024 1014   NEUTROABS 11.3 (H) 07/04/2024 1014   LYMPHSABS 0.6 (L) 07/04/2024 1014   MONOABS 0.8 07/04/2024 1014   EOSABS 0.1 07/04/2024 1014   BASOSABS 0.0 07/04/2024 1014   Comprehensive Metabolic Panel:    Component Value Date/Time   NA 133 (L) 07/04/2024 1014   NA 140 05/31/2023 1417   K 3.4 (L) 07/04/2024 1014   CL 102 07/04/2024 1014   CO2 22 07/04/2024 1014   BUN 12 07/04/2024 1014   BUN 14 05/31/2023 1417   CREATININE 0.79 07/04/2024 1014   GLUCOSE 120 (H) 07/04/2024 1014   CALCIUM  8.5  (L) 07/04/2024 1014   AST 44 (H) 07/04/2024 1014  ALT 21 07/04/2024 1014   ALKPHOS 168 (H) 07/04/2024 1014   BILITOT 0.6 07/04/2024 1014   PROT 5.7 (L) 07/04/2024 1014   PROT 6.5 09/12/2018 1118   ALBUMIN 3.4 (L) 07/04/2024 1014   ALBUMIN 4.0 09/12/2018 1118    RADIOGRAPHIC STUDIES: US  Venous Img Lower Bilateral (DVT) Result Date: 07/02/2024 CLINICAL DATA:  Lower extremity edema. EXAM: BILATERAL LOWER EXTREMITY VENOUS DOPPLER ULTRASOUND TECHNIQUE: Gray-scale sonography with graded compression, as well as color Doppler and duplex ultrasound were performed to evaluate the lower extremity deep venous systems from the level of the common femoral vein and including the common femoral, femoral, profunda femoral, popliteal and calf veins including the posterior tibial, peroneal and gastrocnemius veins when visible. The superficial great saphenous vein was also interrogated. Spectral Doppler was utilized to evaluate flow at rest and with distal augmentation maneuvers in the common femoral, femoral and popliteal veins. COMPARISON:  None Available. FINDINGS: RIGHT LOWER EXTREMITY Common Femoral Vein: No evidence of thrombus. Normal compressibility, respiratory phasicity and response to augmentation. Saphenofemoral Junction: No evidence of thrombus. Normal compressibility and flow on color Doppler imaging. Profunda Femoral Vein: No evidence of thrombus. Normal compressibility and flow on color Doppler imaging. Femoral Vein: No evidence of thrombus. Normal compressibility, respiratory phasicity and response to augmentation. Popliteal Vein: No evidence of thrombus. Normal compressibility, respiratory phasicity and response to augmentation. Calf Veins: No evidence of thrombus. Normal compressibility and flow on color Doppler imaging. Superficial Great Saphenous Vein: No evidence of thrombus. Normal compressibility. Venous Reflux:  None. Other Findings: No evidence of superficial thrombophlebitis. Fluid collection in  the right popliteal fossa measures roughly 3.5 x 4 x 1.5 cm and is consistent with a Baker's cyst. LEFT LOWER EXTREMITY Common Femoral Vein: No evidence of thrombus. Normal compressibility, respiratory phasicity and response to augmentation. Saphenofemoral Junction: No evidence of thrombus. Normal compressibility and flow on color Doppler imaging. Profunda Femoral Vein: No evidence of thrombus. Normal compressibility and flow on color Doppler imaging. Femoral Vein: No evidence of thrombus. Normal compressibility, respiratory phasicity and response to augmentation. Popliteal Vein: No evidence of thrombus. Normal compressibility, respiratory phasicity and response to augmentation. Calf Veins: No evidence of thrombus. Normal compressibility and flow on color Doppler imaging. Superficial Great Saphenous Vein: No evidence of thrombus. Normal compressibility. Venous Reflux:  None. Other Findings: No evidence of superficial thrombophlebitis or abnormal fluid collection. IMPRESSION: 1. No evidence of deep venous thrombosis in either lower extremity. 2. Right popliteal fossa Baker's cyst. Electronically Signed   By: Marcey Moan M.D.   On: 07/02/2024 10:55   MR LUMBAR SPINE WO CONTRAST Result Date: 07/02/2024 CLINICAL DATA:  Initial evaluation for compression fracture. EXAM: MRI LUMBAR SPINE WITHOUT CONTRAST TECHNIQUE: Multiplanar, multisequence MR imaging of the lumbar spine was performed. No intravenous contrast was administered. COMPARISON:  CT from 07/01/2024. FINDINGS: Segmentation: Standard. Lowest well-formed disc space labeled the L5-S1 level. Alignment: Mild dextroscoliosis. 3 mm retrolisthesis of L1 on L2, with trace 2 mm anterolisthesis of L5 on S1. Vertebrae: Multiple scattered marrow replacing lesions seen throughout the visualized lumbar spine, with involvement of the T12, L1, L2, and L5 vertebral bodies, consistent with metastatic disease. Extension to involve the posterior elements on the right at L1 and  bilaterally at L5 and S1. Diffuse involvement of the visualized sacrum and posterior right greater than left iliac wings. Associated pathologic compression fracture at the inferior endplate of T12 with 40% height loss without significant retropulsion. Additional pathologic compression fracture involving the superior endplate of L1 with  mild 10% height loss without retropulsion. Additional pathologic compression fracture at the superior endplate of L2 with up to 40% height loss without retropulsion. Vertebral body height otherwise maintained. Minimal epidural extension of tumor into the right greater than left ventral epidural space at the level of L1 (series 8, image 12). No other significant extra osseous or epidural extension of tumor. Conus medullaris and cauda equina: Conus extends to the L1 level. Conus and cauda equina appear normal. Paraspinal and other soft tissues: Paraspinous soft tissues demonstrate no acute finding. Few scattered T2 hyperintense cyst noted about the visualized kidneys, benign in appearance, no follow-up imaging recommended. Retroaortic left renal vein noted. Disc levels: T11-12: Mild disc bulge with endplate spurring. Right greater than left facet hypertrophy. No spinal stenosis. Mild to moderate bilateral foraminal narrowing, worse on the left. T12-L1: Mild right eccentric disc bulge with endplate spurring. Mild bilateral facet hypertrophy. No spinal stenosis. Foramina remain patent. L1-2: Intervertebral disc space narrowing with trace retrolisthesis. Mild disc bulge with reactive endplate spurring, worse on the right. Mild facet hypertrophy. No significant spinal stenosis. Mild right L1 foraminal narrowing. Left neural foramina remains patent. L2-3: Degenerative vertebral disc space narrowing with diffuse disc bulge. Reactive endplate spurring. Disc bulging asymmetric to the left with superimposed left foraminal to extraforaminal disc protrusion (series 8, image 21). Mild bilateral  facet hypertrophy. No significant spinal stenosis. Mild left L2 foraminal narrowing. Right neural foramen remains patent. L3-4: Degenerative intervertebral disc space narrowing with diffuse disc bulge, asymmetric to the left. Reactive endplate spurring, also greater on the left. Mild to moderate facet and ligament flavum hypertrophy. Resultant mild canal with moderate left lateral recess stenosis. Mild right with moderate left L3 foraminal narrowing. L4-5: Disc desiccation with mild diffuse disc bulge. Reactive endplate spurring, worse on the left. Mild bilateral facet and ligament flavum hypertrophy. No significant spinal stenosis. Foramina remain patent. L5-S1: Degenerative intervertebral disc space narrowing with disc desiccation diffuse disc bulge. Reactive endplate spurring, worse on the right. Mild left with moderate right facet hypertrophy. Mild epidural lipomatosis. Resultant mild narrowing of the right lateral recess. Central canal remains patent. Moderate to severe right L5 foraminal stenosis. Left neural foramen remains patent. IMPRESSION: 1. Multifocal osseous metastases involving the lumbar spine as well as the visualized sacrum and pelvis. 2. Associated pathologic compression fractures involving the inferior endplate of T12 as well as the superior endplates of L1 and L2. Associated height loss of up to 40% without retropulsion. 3. Minimal epidural extension of tumor into the right greater than left ventral epidural space at the level of L1. No other significant extra osseous or epidural extension of tumor. 4. Underlying multilevel degenerative spondylosis and facet arthrosis as above. Resultant mild canal with moderate left lateral recess stenosis at L3-4, with moderate left L3 foraminal narrowing. 5. Moderate to severe right L5 foraminal narrowing related to disc bulge, reactive endplate change, and facet degeneration Electronically Signed   By: Morene Hoard M.D.   On: 07/02/2024 00:54   CT  ABDOMEN PELVIS W CONTRAST Result Date: 07/01/2024 CLINICAL DATA:  Lower abdominal pain, neutropenic fever. Patient on chemotherapy for prostate cancer with lung mass. EXAM: CT ABDOMEN AND PELVIS WITH CONTRAST TECHNIQUE: Multidetector CT imaging of the abdomen and pelvis was performed using the standard protocol following bolus administration of intravenous contrast. RADIATION DOSE REDUCTION: This exam was performed according to the departmental dose-optimization program which includes automated exposure control, adjustment of the mA and/or kV according to patient size and/or use of iterative reconstruction  technique. CONTRAST:  OMNIPAQUE  IOHEXOL  300 MG/ML  SOLN COMPARISON:  04/25/2024, 04/11/2024. FINDINGS: Lower chest: The heart is mildly enlarged and multi-vessel coronary artery calcifications are noted. There is a small left pleural effusion with a associated atelectasis or infiltrate. Multiple nodules are identified at the right lung base, increased in number from the prior exam. The left lung base is obscured due to associated infiltrates. Hepatobiliary: No focal abnormality in the liver. The liver has a nodular contour suggesting underlying cirrhosis. Fatty infiltration is also seen. No biliary ductal dilatation. The gallbladder is without stones. Pancreas: Unremarkable. No pancreatic ductal dilatation or surrounding inflammatory changes. Spleen: Normal in size without focal abnormality. Adrenals/Urinary Tract: The adrenal glands are within normal limits. The kidneys enhance symmetrically. A stable hypodensity is noted in the left kidney, suggesting cyst. No renal calculus or obstructive uropathy bilaterally. The bladder is stable. Stomach/Bowel: The stomach is within normal limits. No bowel obstruction, free air, or pneumatosis is seen. A moderate amount of retained stool is present in the colon. There is mild thickening of the walls of the transverse and descending colon. Appendix appears normal.  Vascular/Lymphatic: Aortic atherosclerosis. No enlarged abdominal or pelvic lymph nodes. Reproductive: Prostate is unremarkable. Other: Rim calcified structures are seen in the pelvis suggesting fat necrosis. There is trace ascites in the right pericolic gutter. Musculoskeletal: Degenerative changes are present in the thoracolumbar spine. Sclerotic changes are noted in the visualized thoracolumbar spine, sacrum, and pelvis. A lytic lesion with bony discontinuity is noted in the inferior pubic ramus on the left with likely associated soft tissue component. There is a new compression deformity in the superior endplate at L2 with loss of vertebral body height of 10% centrally, likely pathologic fracture. IMPRESSION: 1. Mild colonic wall thickening involving the transverse and descending colon, possible colitis. 2. Increased airspace disease at the left lung base, possible atelectasis or infiltrate. 3. Small left pleural effusion. 4. Osteoblastic metastasis in the thoracolumbar spine, sacrum, and pelvis. A new compression deformity in the superior endplate at L2, likely pathologic fracture. 5. Increased number of pulmonary nodules, suspicious for metastatic disease. 6. Hepatic steatosis with morphologic changes in the liver suggestive of cirrhosis. 7. Aortic atherosclerosis and coronary artery calcifications. Electronically Signed   By: Leita Birmingham M.D.   On: 07/01/2024 16:53   DG Chest Port 1 View Result Date: 07/01/2024 CLINICAL DATA:  Fever, chemotherapy patient. EXAM: PORTABLE CHEST 1 VIEW COMPARISON:  11/02/2022, 04/11/2024. FINDINGS: The heart is borderline enlarged and the mediastinal contour is within normal limits. There is atherosclerotic calcification of the aorta. Interstitial prominence is noted bilaterally. A few scattered nodular densities are seen bilaterally. There are suspected trace bilateral pleural effusions. No pneumothorax is seen. A left chest port terminates in the right atrium. IMPRESSION:  1. Interstitial prominence bilaterally with scattered nodular opacities, unchanged from the previous exam. 2. Suspected small bilateral pleural effusions. Electronically Signed   By: Leita Birmingham M.D.   On: 07/01/2024 15:24    PERFORMANCE STATUS (ECOG) : 1 - Symptomatic but completely ambulatory  Review of Systems  Constitutional:  Positive for activity change, appetite change and fatigue.  Gastrointestinal:  Positive for constipation.       Hemorrhoids  Musculoskeletal:  Positive for arthralgias.  Unless otherwise noted, a complete review of systems is negative.  Physical Exam General: NAD HEENT: oral thrush and tongue redness  Cardiovascular: regular rate and rhythm Pulmonary: Normal breathing pattern Extremities: no edema, no joint deformities Skin: no rashes Neurological: Alert and oriented  x3  Discussed the use of AI scribe software for clinical note transcription with the patient, who gave verbal consent to proceed.  History of Present Illness WYNSTON ROMEY is a 79 year old male with poorly differentiated neuroendocrine carcinoma who presents to clinic for his initial palliative visit. No family present. Patient is alert and able to engage appropriately in discussions.   I introduced myself, [place] RN, and Palliative's role in collaboration with the oncology team. Concept of Palliative Care was introduced as specialized medical care for people and their families living with serious illness.  It focuses on providing relief from the symptoms and stress of a serious illness.  The goal is to improve quality of life for both the patient and the family. Values and goals of care important to patient and family were attempted to be elicited.  Larry Kent lives in the home with his wife of more than 55 years and their adult son. He has 2 children and 2 grandchildren. His son is his main support at this time. 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 He enjoys golf although he has not played much recently.   At home patient is able to perform most ADLs independently with some limitations due to fatigue and pain. He experiences shortness of breath upon exertion, such as walking, which he manages by resting until his heart rate normalizes. He also reports abdominal discomfort and hard stools causing concerns with hemorrhoids. Education provided on use of Preparation H for support. We also discussed use of Miralax  once daily with options to increase to twice daily if no improvement.   He reports slow urination and is on Flomax  0.4 mg twice daily, which he feels helps. Appetite fluctuates describing a bitter taste in his mouth, which he attributes to recovering from oral thrush. He is consuming protein drinks, having had two on the first day post-discharge, and is considering increasing his intake. He is using nystatin  for the thrush, which is improving, though his mouth tissue remains sensitive. He has completed current course. On exam small amount of noticeable thrush, tongue redness noted. Education provided on use of magic mouthwash and sent to local pharmacy. He verbalized understanding.   Larry Kent was discharged from the hospital on January 5th. Since then, he has felt generally well, except for experiencing achiness and a temperature of 99.68F the day after discharge. Denies any other febrile events since hospitalization. His appetite is poor, and he has a  We discussed his pain at length. Larry Kent experiences pain primarily in his legs, described as aches from the knees down attributed to recent injections post chemo. He has back pain which he feels is effective. He reports significant improvement post radiation in his sciatic type pain. He takes Vicodin for pain management, using it sparingly, with one dose in the morning and another in the evening on days when pain is more pronounced. No adjustments to regimen at this time.   Goals  of Care  We discussed Larry Kent's current illness and what it means in the larger context of his on-going co-morbidities. Natural disease trajectory and expectations were discussed.  Patient is emotional expressing realistic understanding of his extensive cancer. He speaks to life changes since diagnosis and other stressors. He shares challenges with his wife's health in addition to appreciation of support from his son who is is main support system. Emotional support provided.   He expresses concerns about starting another round of chemotherapy, noting  that his previous experience in 2024 was more manageable and now the level of symptom burden and changes in his quality of life related to this. His quality of life is most important. We discussed best case and worst case scenario.   Larry Kent is clear in his expressed goals and wishes are to continue to treat the treatable allowing him every opportunity to thrive.   I offered management of anxiety and depression however patient reports he generally copes well if he doesn't have to talk about it! He is tearful in discussion. Space and opportunity created to allow him time to express his thoughts and feelings. He knows we are available for support and open to considering medications as needed to assist in coping as appropriately emotional state can be affected in addition to physical state. He verbalized understanding and appreciation.   I discussed the importance of continued conversation with family and their medical providers regarding overall plan of care and treatment options, ensuring decisions are within the context of the patients values and GOCs. Assessment & Plan Established therapeutic relationship. Education provided on palliative's role in collaboration with their Oncology/Radiation team.  Palliative care management for metastatic prostate cancer Metastatic prostate cancer with recent hospitalization. Concerns about quality of life  and potential side effects of upcoming chemotherapy. Discussion about the role of palliative care in symptom management and quality of life. He is apprehensive about starting another chemotherapy treatment due to previous adverse effects and is considering the balance between treatment benefits and quality of life. - Continue palliative care management with focus on symptom control and quality of life. - Scheduled follow-up appointment on February 9th for next treatment. - Provided contact information for palliative care team for any concerns or changes in condition. -Patient provided with written education material for support.   Neoplasm related pain Pain primarily in the legs from knees down, likely related to recent post treatment injections. No current sciatica pain, but history of severe back pain due to metastatic disease. This has since improved post radiation. Pain managed with Vicodin as needed.  - Refilled Vicodin 5/325 mg every 6 hours as needed. Prescription sent to [facility] as requested. - Continue to monitor pain levels and adjust medication as needed.  Neoplastic (malignant) related fatigue Fatigue likely related to recent chemotherapy and hospitalization. Energy levels improving but still present. Discussion about the impact of chemotherapy and radiation on energy levels. - Encouraged rest and gradual increase in activity as tolerated. Listening to his body.   Oral candidiasis Thrush with bitter taste and sensitive oral tissues. Currently using nystatin  mouthwash with some improvement. Discussion about compounded medication to address taste and oral discomfort. - Prescribed compounded mouthwash with nystatin  and additional ingredients to coat the tongue and reduce burning sensation. - Instructed to use mouthwash after oral medications and wait 30 minutes after taking pills before taking.  - [facility]; if unavailable, will consider [facility]. -Prescribed magic mouthwash four times daily.   Constipation and hemorrhoids Constipation with hard stools and hemorrhoids. Recent diarrhea managed with Imodium. Discussion about using Miralax  to soften stools and manage constipation. - Start Miralax  once daily, increase to twice daily if needed. - Consider using Preparation H or similar products for hemorrhoid management.  Goals of Care Patient is realistic in his understanding of extensive cancer.  Emotional during discussions expressing significant change in quality of life and symptom burden.  He is clear and expressed wishes to continue to treat the treatable allowing  every opportunity for him to continue to thrive and have some stability.  His quality of life is most important.  Discussed use of medication to assist in feelings of anxiety and depression in conjunction with ongoing discussions.  Declines need at this time expressing he generally copes with recent life changes specifically more so when he does not have to talk about them but also with awareness but it is best to express his feelings allowing support and coping mechanisms. - We will continue to closely monitor and support as needed.  Patient is appreciative of this and agrees to ongoing discussions and emotional support. - Open to considering antianxiety/antidepressant as needed if symptoms worsen. - Patient son Reyes would be his medical decision-maker in the event he is unable to make decisions for himself.  He is patient's primary support system.  Follow-up I will plan to see patient back in 2-3 weeks.  Sooner if needed.   Patient expressed understanding and was in agreement with this plan. He also understands that He can call the clinic at any time with any questions, concerns, or complaints.   Thank you for your referral and allowing Palliative to assist in [name]'s care.   Number and complexity of problems addressed: HIGH - 1 or more chronic  illnesses with SEVERE exacerbation, progression, or side effects of treatment - advanced cancer, pain. Any controlled substances utilized were prescribed in the context of palliative care.  I personally spent a total of 70 minutes in the care of the patient today including preparing to see the patient, getting/reviewing separately obtained history, performing a medically appropriate exam/evaluation, counseling and educating, placing orders, documenting clinical information in the EHR, independently interpreting results, and coordinating care. Visit consisted of counseling and education dealing with the complex and emotionally intense issues of symptom management and palliative care in the setting of serious and potentially life-threatening illness.  Signed by: Levon Borer, AGPCNP-BC Palliative Medicine Team/[facility]    "

## 2024-07-07 ENCOUNTER — Ambulatory Visit: Attending: Cardiovascular Disease | Admitting: Cardiovascular Disease

## 2024-07-07 ENCOUNTER — Encounter: Payer: Self-pay | Admitting: Cardiovascular Disease

## 2024-07-07 VITALS — BP 140/60 | HR 90 | Ht 69.0 in | Wt 190.0 lb

## 2024-07-07 DIAGNOSIS — I350 Nonrheumatic aortic (valve) stenosis: Secondary | ICD-10-CM

## 2024-07-07 DIAGNOSIS — E785 Hyperlipidemia, unspecified: Secondary | ICD-10-CM

## 2024-07-07 DIAGNOSIS — I25118 Atherosclerotic heart disease of native coronary artery with other forms of angina pectoris: Secondary | ICD-10-CM

## 2024-07-07 DIAGNOSIS — I10 Essential (primary) hypertension: Secondary | ICD-10-CM | POA: Diagnosis not present

## 2024-07-07 DIAGNOSIS — I6523 Occlusion and stenosis of bilateral carotid arteries: Secondary | ICD-10-CM | POA: Diagnosis not present

## 2024-07-07 MED ORDER — METOPROLOL TARTRATE 25 MG PO TABS: 12.5000 mg | ORAL_TABLET | Freq: Two times a day (BID) | ORAL | 3 refills | Status: AC

## 2024-07-07 NOTE — Progress Notes (Signed)
 "    Cardiology Office Note   Date:  07/07/2024   ID:  Larry Kent, DOB 04/07/1946, MRN [id number]  PCP:  [name], MD  Cardiologist:   Deatrice Cage, MD   Chief Complaint  Patient presents with   Follow-up    6 month f/u pt would like to discuss aortic stenosis. Meds reviewed verbally with pt.      History of Present Illness: Larry Kent is a 79 y.o. male who is here today for a follow-up visit regarding coronary artery disease, carotid disease and aortic stenosis.   The patient is status post right carotid endarterectomy in June 2021 for severe asymptomatic stenosis. He has chronic medical conditions that include hyperlipidemia, aortic stenosis, prostate cancer and lung nodules with pathology showing neuroendocrine carcinoma.  He presented in March 2024 to the cancer center with chest pain and shortness of breath.  EKG showed evidence of inferior ST elevation.  He was transferred to [facility] and underwent emergent cardiac catheterization which showed thrombotic occlusion of the right coronary artery which was treated with 4 overlapped drug-eluting stents.  There was 50% distal left main stenosis, 60% proximal left circumflex stenosis and 60% mid LAD stenosis.  Echocardiogram showed normal LV systolic function.  He underwent a follow-up PET scan in June, 2024 which showed no evidence of ischemia with normal ejection fraction.   He continues to receive treatment for metastatic poorly differentiated neuroendocrine carcinoma with suspicions of primary prostate versus primary lung cancer.  He gets chemotherapy and hormone therapy.  He was hospitalized recently with neutropenic fever.  Overall, his cancer progressed and thus his chemotherapy was changed.  During last visit, I stopped small dose metoprolol  due to hypotension but he felt worse since then with increased resting heart rate with mild exertional chest pain.   Past Medical History:  Diagnosis Date    Allergy    Arthritis    both knees    BCC (basal cell carcinoma), arm, right 09/2019   MOHS ([place])   BCC (basal cell carcinoma), face 2014   L preauricular s/p MOHS   BCC (basal cell carcinoma), face 02/2018   L upper lip   BPH (benign prostatic hypertrophy)    Dr. Nieves @ [facility]   Carotid stenosis 05/2015   RICA 40-59%, LICA 1-39%, L vertebral occlusion, rpt 1 yr   Cataract    removed both eyes    Diabetes mellitus without complication (HCC)    FHx: colon cancer    FHx: prostate cancer    Heart murmur    mild aortic stenosis    History of kidney stones    Hyperlipidemia    borderline- on rosuvastatin  now normal    Lung cancer (HCC) 07/2022   Myocardial infarction (HCC) 09/07/2022   Occlusion of right vertebral artery 05/2015   Prostate cancer (HCC) 07/2022    Past Surgical History:  Procedure Laterality Date   BASAL CELL CARCINOMA EXCISION  05/2018   lip, 2020 x3  basal cells removed    CATARACT EXTRACTION  2008   Left   CATARACT EXTRACTION  2013   Right ([place])   COLONOSCOPY  ~2010   medium int hemorrhoids, o/w WNL, rpt 5 yrs given fmhx (Dr. Celestia)   COLONOSCOPY  08/2019   TAs, diverticulosis, rpt 3 yrs ([place])   COLONOSCOPY  12/2023   2TAs, diverticulosis, int hem, no f/u needed ([place])   CORONARY/GRAFT ACUTE MI REVASCULARIZATION N/A 09/07/2022   Procedure: Coronary/Graft Acute MI Revascularization;  Surgeon: Thukkani, Arun K, MD;  Location: [facility] INVASIVE CV LAB;  Service: Cardiovascular;  Laterality: N/A;   ENDARTERECTOMY Right 12/01/2019   Procedure: RIGHT ENDARTERECTOMY CAROTID;  Surgeon: Gretta Lonni PARAS, MD;  Location: [facility] OR;  Service: Vascular;  Laterality: Right;   exercise treadmill  11/2005   WNL ([place])   FINE NEEDLE ASPIRATION  07/28/2022   Procedure: FINE NEEDLE ASPIRATION (FNA) LINEAR;  Surgeon: Brenna Adine CROME, DO;  Location: [facility] ENDOSCOPY;  Service: Pulmonary;;   HERNIA REPAIR  2008   Right   IR IMAGING GUIDED PORT  INSERTION  08/11/2022   IR IMAGING GUIDED PORT INSERTION  10/08/2022   IR REMOVAL TUN ACCESS W/ PORT W/O FL MOD SED  09/10/2022   LEFT HEART CATH AND CORONARY ANGIOGRAPHY N/A 09/07/2022   Procedure: LEFT HEART CATH AND CORONARY ANGIOGRAPHY;  Surgeon: Wendel Lurena POUR, MD;  Location: [facility] INVASIVE CV LAB;  Service: Cardiovascular;  Laterality: N/A;   MOHS SURGERY Left spring 2014   basal cell face   PATCH ANGIOPLASTY Right 12/01/2019   Procedure: PATCH ANGIOPLASTY USING XENOSURE BIOLOGIC PATCH;  Surgeon: Gretta Lonni PARAS, MD;  Location: [facility] OR;  Service: Vascular;  Laterality: Right;   Testicular Biopsy  2003   benign, varicocele   US  ECHOCARDIOGRAPHY  11/2005   aortic sclerosis, EF 55-60%, diastolic dysfunction   VIDEO BRONCHOSCOPY WITH ENDOBRONCHIAL ULTRASOUND Bilateral 07/28/2022   Procedure: VIDEO BRONCHOSCOPY WITH ENDOBRONCHIAL ULTRASOUND;  Surgeon: Brenna Adine CROME, DO;  Location: [facility] ENDOSCOPY;  Service: Pulmonary;  Laterality: Bilateral;     Current Outpatient Medications  Medication Sig Dispense Refill   abiraterone acetate (ZYTIGA) 250 MG tablet Take 1,000 mg by mouth daily.     Accu-Chek Softclix Lancets lancets SMARTSIG:Topical     amoxicillin -clavulanate (AUGMENTIN ) 875-125 MG tablet Take 1 tablet by mouth 2 (two) times daily for 5 days. 10 tablet 0   aspirin  EC 81 MG tablet Take 1 tablet (81 mg total) by mouth daily. Swallow whole.     Blood Glucose Monitoring Suppl DEVI 1 each by Does not apply route in the morning, at noon, and at bedtime. E11.69. May substitute to any manufacturer covered by patient's insurance. 1 each 0   CLINPRO 5000 1.1 % PSTE Place onto teeth.     clopidogrel  (PLAVIX ) 75 MG tablet TAKE 1 TABLET BY MOUTH EVERY DAY 90 tablet 2   dexamethasone  (DECADRON ) 4 MG tablet Take 2 tabs by mouth 2 times daily starting day before chemo. Then take 2 tabs daily for 2 days starting day after chemo. Take with food. 30 tablet 1   doxycycline  (ADOXA) 100 MG tablet Take 1  tablet (100 mg total) by mouth 2 (two) times daily for 5 days. 10 tablet 0   fluorouracil (EFUDEX) 5 % cream Apply topically 2 (two) times daily.     fluticasone  (FLONASE ) 50 MCG/ACT nasal spray Place 1 spray into both nostrils daily as needed for allergies or rhinitis.     HYDROcodone -acetaminophen  (NORCO/VICODIN) 5-325 MG tablet Take 1-2 tablets by mouth every 6 (six) hours as needed for moderate pain (pain score 4-6). 60 tablet 0   Lancets Misc. (ACCU-CHEK SOFTCLIX LANCET DEV) KIT USE TO CHECK BLOOD SUGAR IN THE MORNING, AT NOON, AND AT BEDTIME     lidocaine -prilocaine  (EMLA ) cream Apply to affected area once 30 g 3   magic mouthwash (nystatin , diphenhydrAMINE , alum & mag hydroxide) suspension mixture Swish and spit 5 mLs 3 (three) times daily. 240 mL 1   pantoprazole  (PROTONIX ) 40 MG tablet  Take 1 tablet (40 mg total) by mouth daily. 30 tablet 11   potassium chloride  (KLOR-CON ) 10 MEQ tablet Take 1 tablet (10 mEq total) by mouth as needed. 90 tablet 3   predniSONE  (DELTASONE ) 5 MG tablet Take 5 mg by mouth daily.     rosuvastatin  (CRESTOR ) 5 MG tablet TAKE 1 TABLET (5 MG TOTAL) BY MOUTH DAILY. 90 tablet 1   tamsulosin  (FLOMAX ) 0.4 MG CAPS capsule Take 0.4 mg by mouth 2 (two) times daily.     [START ON 07/10/2024] Vitamin D , Ergocalciferol , (DRISDOL ) 1.25 MG (50000 UNIT) CAPS capsule Take 1 capsule (50,000 Units total) by mouth every 7 (seven) days. 12 capsule 0   Calcium  Carbonate-Vit D-Min (CALCIUM  600+D PLUS MINERALS) 600-400 MG-UNIT TABS Take 600 mg by mouth daily. (Patient not taking: Reported on 07/07/2024)     [Paused] Cholecalciferol (VITAMIN D3) 25 MCG (1000 UT) CAPS Take 1 capsule (1,000 Units total) by mouth daily. (Patient not taking: Reported on 07/07/2024)     Multiple Vitamin (MULTIVITAMIN) tablet Take 1 tablet by mouth daily with breakfast. (Patient not taking: Reported on 07/07/2024)     nystatin  (MYCOSTATIN ) 100000 UNIT/ML suspension Take 5 mLs (500,000 Units total) by mouth 4 (four)  times daily. (Patient not taking: Reported on 07/07/2024) 60 mL 0   ondansetron  (ZOFRAN ) 8 MG tablet Take 1 tablet (8 mg total) by mouth every 8 (eight) hours as needed for nausea or vomiting. (Patient not taking: Reported on 07/07/2024) 30 tablet 1   predniSONE  (DELTASONE ) 5 MG tablet Take 1 tablet (5 mg total) by mouth in the morning and at bedtime. (Patient not taking: Reported on 07/07/2024) 60 tablet 11   prochlorperazine  (COMPAZINE ) 10 MG tablet Take 1 tablet (10 mg total) by mouth every 6 (six) hours as needed for nausea or vomiting. (Patient not taking: Reported on 07/07/2024) 30 tablet 1   No current facility-administered medications for this visit.    Allergies:   Patient has no known allergies.    Social History:  The patient  reports that he has never smoked. He has never used smokeless tobacco. He reports that he does not currently use alcohol . He reports that he does not use drugs.   Family History:  The patient's family history includes Breast cancer in his sister; Cancer (age of onset: 33) in his sister; Cancer (age of onset: 19) in his paternal grandfather; Cancer (age of onset: 78) in his father; Cancer (age of onset: 54) in his mother; Colon cancer in his maternal grandmother and mother; Coronary artery disease in his maternal uncle; Coronary artery disease (age of onset: 21) in his father; Hypertension in his mother; Prostate cancer in his father and paternal grandfather; Stroke in his mother.    ROS:  Please see the history of present illness.   Otherwise, review of systems are positive for none.   All other systems are reviewed and negative.    PHYSICAL EXAM: VS:  BP (!) 140/60 (BP Location: Left Arm, Patient Position: Sitting, Cuff Size: Normal)   Pulse 90   Ht 5' 9 (1.753 m)   Wt 190 lb (86.2 kg)   SpO2 97%   BMI 28.06 kg/m  , BMI Body mass index is 28.06 kg/m. GEN: Well nourished, well developed, in no acute distress  HEENT: normal  Neck: no JVD or masses.  Left  carotid bruit.   Cardiac: RRR; no  rubs, or gallops,no edema .  3 / 6 systolic murmur in the aortic area which is mid  peaking. Respiratory:  clear to auscultation bilaterally, normal work of breathing GI: soft, nontender, nondistended, + BS MS: no deformity or atrophy  Skin: warm and dry, no rash Neuro:  Strength and sensation are intact Psych: euthymic mood, full affect   EKG:  EKG is ordered today. EKG showed: Normal sinus rhythm Possible Inferior infarct (cited on or before 07-Sep-2022) When compared with ECG of 01-Jul-2024 12:57, Borderline criteria for Lateral infarct are no longer Present   Recent Labs: 10/01/2023: TSH 1.72 07/03/2024: Magnesium  1.8 07/04/2024: ALT 21; BUN 12; Creatinine 0.79; Hemoglobin 10.0; Platelet Count 69; Potassium 3.4; Sodium 133    Lipid Panel    Component Value Date/Time   CHOL 149 01/25/2024 0939   CHOL 128 09/12/2018 1118   TRIG 80.0 01/25/2024 0939   HDL 71.10 01/25/2024 0939   HDL 57 09/12/2018 1118   CHOLHDL 2 01/25/2024 0939   VLDL 16.0 01/25/2024 0939   LDLCALC 62 01/25/2024 0939   LDLCALC 57 09/12/2018 1118   LDLDIRECT 144.8 02/09/2012 0928      Wt Readings from Last 3 Encounters:  07/07/24 190 lb (86.2 kg)  07/06/24 191 lb 5 oz (86.8 kg)  07/02/24 220 lb 0.3 oz (99.8 kg)          08/02/2018    9:04 AM  PAD Screen  Previous PAD dx? No  Previous surgical procedure? No  Pain with walking? No  Feet/toe relief with dangling? No  Painful, non-healing ulcers? No  Extremities discolored? No      ASSESSMENT AND PLAN:  1.  Coronary artery disease involving native coronary arteries with other forms of angina: He reports mild exertional dyspnea and chest pain since metoprolol  was stopped.  I elected to resume small dose metoprolol  12.5 mg twice daily.  2.  Carotid artery disease: Status post right carotid endarterectomy in 2021.  He is followed by [facility].  3.  Hyperlipidemia: Continue treatment with rosuvastatin .  Most recent lipid  profile showed an LDL of 62.   4.  Aortic stenosis: This remains mild on most recent echocardiogram in March of 2024.  Recent worsening of exertional chest pain and shortness of breath could be due to progression of aortic stenosis.  I requested a follow-up echocardiogram.  I explained to him that TAVR would be unlikely to be pursued unless his cancer is in remission.  5.  Metastatic neuroendocrine carcinoma: On new chemotherapy and hormone therapy.    Disposition:   FU with me in 6 months  Signed,  Deatrice Cage, MD  07/07/2024 9:58 AM    [facility] "

## 2024-07-07 NOTE — Patient Instructions (Signed)
 Medication Instructions:  Your physician recommends the following medication changes.  START TAKING: Metoprolol  12.5 mg by mouth twice daily   *If you need a refill on your cardiac medications before your next appointment, please call your pharmacy*  Lab Work: No labs ordered today    Testing/Procedures: Your physician has requested that you have an echocardiogram. Echocardiography is a painless test that uses sound waves to create images of your heart. It provides your doctor with information about the size and shape of your heart and how well your hearts chambers and valves are working.   You may receive an ultrasound enhancing agent through an IV if needed to better visualize your heart during the echo. This procedure takes approximately one hour.  There are no restrictions for this procedure.  This will take place at [address] [facility] [address], [place] [address]  Please note: We ask at that you not bring children with you during ultrasound (echo/ vascular) testing. Due to room size and safety concerns, children are not allowed in the ultrasound rooms during exams. Our front office staff cannot provide observation of children in our lobby area while testing is being conducted. An adult accompanying a patient to their appointment will only be allowed in the ultrasound room at the discretion of the ultrasound technician under special circumstances. We apologize for any inconvenience.   Follow-Up: At [facility], you and your health needs are our priority.  As part of our continuing mission to provide you with exceptional heart care, our providers are all part of one team.  This team includes your primary Cardiologist (physician) and Advanced Practice Providers or APPs (Physician Assistants and Nurse Practitioners) who all work together to provide you with the care you need, when you need it.  Your next appointment:   6 month(s)  Provider:   You may see  Deatrice Cage, MD or one of the following Advanced Practice Providers on your designated Care Team:   Lonni Meager, NP Lesley Maffucci, PA-C Bernardino Bring, PA-C Cadence [place], PA-C Tylene Lunch, NP Barnie Hila, NP

## 2024-07-10 ENCOUNTER — Inpatient Hospital Stay

## 2024-07-10 ENCOUNTER — Inpatient Hospital Stay: Admitting: Internal Medicine

## 2024-07-10 LAB — CULTURE, BLOOD (ROUTINE X 2)
Culture: NO GROWTH
Culture: NO GROWTH
Special Requests: ADEQUATE
Special Requests: ADEQUATE

## 2024-07-11 ENCOUNTER — Inpatient Hospital Stay: Admitting: Family Medicine

## 2024-07-11 ENCOUNTER — Inpatient Hospital Stay

## 2024-07-11 DIAGNOSIS — C61 Malignant neoplasm of prostate: Secondary | ICD-10-CM | POA: Diagnosis not present

## 2024-07-11 DIAGNOSIS — C7A1 Malignant poorly differentiated neuroendocrine tumors: Secondary | ICD-10-CM

## 2024-07-11 LAB — CBC WITH DIFFERENTIAL (CANCER CENTER ONLY)
Abs Immature Granulocytes: 0.1 K/uL — ABNORMAL HIGH (ref 0.00–0.07)
Basophils Absolute: 0.1 K/uL (ref 0.0–0.1)
Basophils Relative: 1 %
Eosinophils Absolute: 0 K/uL (ref 0.0–0.5)
Eosinophils Relative: 0 %
HCT: 30 % — ABNORMAL LOW (ref 39.0–52.0)
Hemoglobin: 10.1 g/dL — ABNORMAL LOW (ref 13.0–17.0)
Immature Granulocytes: 1 %
Lymphocytes Relative: 12 %
Lymphs Abs: 0.9 K/uL (ref 0.7–4.0)
MCH: 34.4 pg — ABNORMAL HIGH (ref 26.0–34.0)
MCHC: 33.7 g/dL (ref 30.0–36.0)
MCV: 102 fL — ABNORMAL HIGH (ref 80.0–100.0)
Monocytes Absolute: 0.7 K/uL (ref 0.1–1.0)
Monocytes Relative: 9 %
Neutro Abs: 5.7 K/uL (ref 1.7–7.7)
Neutrophils Relative %: 77 %
Platelet Count: 94 K/uL — ABNORMAL LOW (ref 150–400)
RBC: 2.94 MIL/uL — ABNORMAL LOW (ref 4.22–5.81)
RDW: 15.4 % (ref 11.5–15.5)
WBC Count: 7.5 K/uL (ref 4.0–10.5)
nRBC: 0 % (ref 0.0–0.2)

## 2024-07-11 LAB — CMP (CANCER CENTER ONLY)
ALT: 21 U/L (ref 0–44)
AST: 36 U/L (ref 15–41)
Albumin: 3.1 g/dL — ABNORMAL LOW (ref 3.5–5.0)
Alkaline Phosphatase: 155 U/L — ABNORMAL HIGH (ref 38–126)
Anion gap: 11 (ref 5–15)
BUN: 14 mg/dL (ref 8–23)
CO2: 21 mmol/L — ABNORMAL LOW (ref 22–32)
Calcium: 8 mg/dL — ABNORMAL LOW (ref 8.9–10.3)
Chloride: 107 mmol/L (ref 98–111)
Creatinine: 0.71 mg/dL (ref 0.61–1.24)
GFR, Estimated: >60 mL/min
Glucose, Bld: 113 mg/dL — ABNORMAL HIGH (ref 70–99)
Potassium: 3.5 mmol/L (ref 3.5–5.1)
Sodium: 139 mmol/L (ref 135–145)
Total Bilirubin: 0.6 mg/dL (ref 0.0–1.2)
Total Protein: 5.5 g/dL — ABNORMAL LOW (ref 6.5–8.1)

## 2024-07-12 ENCOUNTER — Telehealth: Payer: Self-pay

## 2024-07-12 ENCOUNTER — Inpatient Hospital Stay

## 2024-07-12 NOTE — Progress Notes (Signed)
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  The patient did note fatigue during radiation. The patient did not note skin changes in the field of radiation during therapy. The patient has noticed improvement in pain in the area(s) treated with radiation. The patient is taking dexamethasone  only with chemo. The patient does have some symptoms of numbness of the extremities (feet). The patient does not have symptoms of headache. The patient does not have symptoms of seizure or uncontrolled movement. The patient does not have symptoms of changes in vision. The patient does not have changes in speech. The patient does not have confusion.   The patient is scheduled for ongoing care with Dr. Sherrod in medical oncology. The patient was encouraged to call if he develops concerns or questions regarding radiation.   "

## 2024-07-12 NOTE — Telephone Encounter (Signed)
 Agree with this. Thanks.

## 2024-07-12 NOTE — Telephone Encounter (Signed)
 Copied from CRM [id number]. Topic: General - Other >> Jul 11, 2024  5:03 PM Alfonso HERO wrote: Reason for CRM: requesting verbal orders for PT once a week for 8 weeks and a social eval.

## 2024-07-13 NOTE — Telephone Encounter (Signed)
 [facility] notified of verbal orders

## 2024-07-14 ENCOUNTER — Ambulatory Visit: Admitting: Family Medicine

## 2024-07-14 ENCOUNTER — Encounter: Payer: Self-pay | Admitting: Family Medicine

## 2024-07-14 VITALS — BP 90/50 | HR 88 | Temp 98.7°F | Ht 69.0 in | Wt 188.0 lb

## 2024-07-14 DIAGNOSIS — D61810 Antineoplastic chemotherapy induced pancytopenia: Secondary | ICD-10-CM

## 2024-07-14 DIAGNOSIS — C7A1 Malignant poorly differentiated neuroendocrine tumors: Secondary | ICD-10-CM

## 2024-07-14 DIAGNOSIS — I350 Nonrheumatic aortic (valve) stenosis: Secondary | ICD-10-CM

## 2024-07-14 DIAGNOSIS — E559 Vitamin D deficiency, unspecified: Secondary | ICD-10-CM

## 2024-07-14 DIAGNOSIS — D709 Neutropenia, unspecified: Secondary | ICD-10-CM

## 2024-07-14 DIAGNOSIS — C61 Malignant neoplasm of prostate: Secondary | ICD-10-CM

## 2024-07-14 DIAGNOSIS — I959 Hypotension, unspecified: Secondary | ICD-10-CM

## 2024-07-14 DIAGNOSIS — C3490 Malignant neoplasm of unspecified part of unspecified bronchus or lung: Secondary | ICD-10-CM

## 2024-07-14 DIAGNOSIS — K7460 Unspecified cirrhosis of liver: Secondary | ICD-10-CM

## 2024-07-14 DIAGNOSIS — S32020A Wedge compression fracture of second lumbar vertebra, initial encounter for closed fracture: Secondary | ICD-10-CM

## 2024-07-14 NOTE — Progress Notes (Unsigned)
 " Ph: [phone] Fax: [phone]   Patient ID: Larry Kent, male    DOB: 1945/09/25, 79 y.o.   MRN: [id number]  This visit was conducted in person.  BP (!) 90/50 (BP Location: Left Arm, Cuff Size: Normal)   Pulse 88   Temp 98.7 F (37.1 C) (Oral)   Ht 5' 9 (1.753 m)   Wt 188 lb (85.3 kg)   SpO2 96%   BMI 27.76 kg/m   BP Readings from Last 3 Encounters:  07/14/24 (!) 90/50  07/07/24 (!) 140/60  07/06/24 127/86    CC: hosp f/u visit  Subjective:   HPI: Larry Kent is a 79 y.o. male presenting on 07/14/2024 for Hospitalization Follow-up (HSP FU 07/01/24/Hsp dx were / Severe neutropenia and close fracture compression fracture//Pt states that he is feeling better as far as sickness but no change mobility wise/Pt was abnormal gait and trouble standing up from chair in office today//Pt feels like Home services has not been as helpful. States only pt has been by 1 tine to do eval)   Recent hospitalization for general weakness, RLE pain, chills, found to have marked leukopenia WBC 0.4.  Hospital records reviewed. Med rec performed.  Respiratory panel negative. CT abd/pelvis showed mild colon wall thickening of transferse and descending colon concerning for colitis. Also with LLL pleural effusion with associated atx vs infiltrate and cirrhotic changes. Also noted new compression deformity at L2 concerning for vertebral fracture.  Lumbar MRI confirmed multiple metastatic lesions to lumbar spine/sacrum/pelvis as well as pathologic compression fractures of inferior endplate of T12 as well as superior endplates of L1 and L2 (previously known). There was also epidural extension of tumor into R>L ventral epidural space at L1 as well as mod-severe R L5 foraminal narrowing due to disc bulge and facet degeneration.  BLE venous US  negative for DVT but did show R popliteal Baker's cyst.  Vit D weekly 50k units was started at hospital.  He was treated with IV cefepime , flagyl , and  vancomycin  during hospitalization. He was discharged on 5 days of oral augmentin  and doxycycline .   Notes poor appetite. BP markedly low today but denies dizziness, lightheadedness with standing. Does note exertional dyspnea without chest pain or leg swelling. Metoprolol  12.5mg  BID recently started by cardiology.  No new blood in stool or urine.   He notes pain and symptoms came on after Neulasta  GCSF injection.   Known metastatic poorly differentiated carcinoma with primary lung vs prostate, regularly sees oncology receiving palliative chemotherapy as well as Eligard, oral zytiga, IV docetaxel . Completed palliative radiation treatment 06/14/2024. Received GCSF 06/28/2024.   Chronic pain managed with vicodin 5/325mg  1-2 tabs PRN.  Miralax  recently started as well.    Home health was set up with [facility] for PT and social work eval.  Other follow up appointments scheduled: palliative care 1/8, cardiology 1/9, upcoming appt with oncology 07/17/2024 Next [facility] appt with Larry Kent is 07/20/2024 Son Larry Kent is primary medical decision maker if pt is impaired.  ______________________________________________________________________ Hospital admission: 07/01/2024 Hospital discharge: 07/03/2024 TCM f/u phone call:  performed on 07/04/2024.   Recommendations for Outpatient Follow-up:  F/u with PCP in 1 wk F/u with Oncologist in 1 wk Follow up LABS/TEST:  CBC with diff in 1 wk Vit d level in btw 3-6 months  Discharge Diagnoses:  Principal Problem:   Severe neutropenia Active Problems:   Hyperlipidemia with target LDL less than 70   Malignant neoplasm of unspecified part of unspecified bronchus or  lung (HCC)   Prostate cancer metastatic to multiple sites [facility])   Thrombocytopenia   Cirrhosis of liver (HCC)   Type 2 diabetes mellitus with other specified complication (HCC)   Essential hypertension   GERD (gastroesophageal reflux disease)   Closed compression fracture of L2 lumbar  vertebra, initial encounter (HCC)     Relevant past medical, surgical, family and social history reviewed and updated as indicated. Interim medical history since our last visit reviewed. Allergies and medications reviewed and updated. Outpatient Medications Prior to Visit  Medication Sig Dispense Refill   abiraterone acetate (ZYTIGA) 250 MG tablet Take 1,000 mg by mouth daily.     Accu-Chek Softclix Lancets lancets SMARTSIG:Topical     aspirin  EC 81 MG tablet Take 1 tablet (81 mg total) by mouth daily. Swallow whole.     Blood Glucose Monitoring Suppl DEVI 1 each by Does not apply route in the morning, at noon, and at bedtime. E11.69. May substitute to any manufacturer covered by patient's insurance. 1 each 0   clopidogrel  (PLAVIX ) 75 MG tablet TAKE 1 TABLET BY MOUTH EVERY DAY 90 tablet 2   dexamethasone  (DECADRON ) 4 MG tablet Take 2 tabs by mouth 2 times daily starting day before chemo. Then take 2 tabs daily for 2 days starting day after chemo. Take with food. 30 tablet 1   fluorouracil (EFUDEX) 5 % cream Apply topically 2 (two) times daily.     fluticasone  (FLONASE ) 50 MCG/ACT nasal spray Place 1 spray into both nostrils daily as needed for allergies or rhinitis.     HYDROcodone -acetaminophen  (NORCO/VICODIN) 5-325 MG tablet Take 1-2 tablets by mouth every 6 (six) hours as needed for moderate pain (pain score 4-6). 60 tablet 0   Lancets Misc. (ACCU-CHEK SOFTCLIX LANCET DEV) KIT USE TO CHECK BLOOD SUGAR IN THE MORNING, AT NOON, AND AT BEDTIME     lidocaine -prilocaine  (EMLA ) cream Apply to affected area once 30 g 3   metoprolol  tartrate (LOPRESSOR ) 25 MG tablet Take 0.5 tablets (12.5 mg total) by mouth 2 (two) times daily. 90 tablet 3   pantoprazole  (PROTONIX ) 40 MG tablet Take 1 tablet (40 mg total) by mouth daily. 30 tablet 11   predniSONE  (DELTASONE ) 5 MG tablet Take 5 mg by mouth daily.     rosuvastatin  (CRESTOR ) 5 MG tablet TAKE 1 TABLET (5 MG TOTAL) BY MOUTH DAILY. 90 tablet 1    tamsulosin  (FLOMAX ) 0.4 MG CAPS capsule Take 0.4 mg by mouth 2 (two) times daily.     Vitamin D , Ergocalciferol , (DRISDOL ) 1.25 MG (50000 UNIT) CAPS capsule Take 1 capsule (50,000 Units total) by mouth every 7 (seven) days. 12 capsule 0   Calcium  Carbonate-Vit D-Min (CALCIUM  600+D PLUS MINERALS) 600-400 MG-UNIT TABS Take 600 mg by mouth daily. (Patient not taking: Reported on 07/07/2024)     CLINPRO 5000 1.1 % PSTE Place onto teeth. (Patient not taking: Reported on 07/14/2024)     magic mouthwash (nystatin , diphenhydrAMINE , alum & mag hydroxide) suspension mixture Swish and spit 5 mLs 3 (three) times daily. (Patient not taking: Reported on 07/14/2024) 240 mL 1   Multiple Vitamin (MULTIVITAMIN) tablet Take 1 tablet by mouth daily with breakfast. (Patient not taking: Reported on 07/07/2024)     nystatin  (MYCOSTATIN ) 100000 UNIT/ML suspension Take 5 mLs (500,000 Units total) by mouth 4 (four) times daily. (Patient not taking: Reported on 07/07/2024) 60 mL 0   ondansetron  (ZOFRAN ) 8 MG tablet Take 1 tablet (8 mg total) by mouth every 8 (eight) hours as needed for  nausea or vomiting. (Patient not taking: Reported on 07/07/2024) 30 tablet 1   potassium chloride  (KLOR-CON ) 10 MEQ tablet Take 1 tablet (10 mEq total) by mouth as needed. (Patient not taking: Reported on 07/14/2024) 90 tablet 3   predniSONE  (DELTASONE ) 5 MG tablet Take 1 tablet (5 mg total) by mouth in the morning and at bedtime. (Patient not taking: Reported on 07/07/2024) 60 tablet 11   prochlorperazine  (COMPAZINE ) 10 MG tablet Take 1 tablet (10 mg total) by mouth every 6 (six) hours as needed for nausea or vomiting. (Patient not taking: Reported on 07/07/2024) 30 tablet 1   Cholecalciferol (VITAMIN D3) 25 MCG (1000 UT) CAPS Take 1 capsule (1,000 Units total) by mouth daily. (Patient not taking: Reported on 07/07/2024)     No facility-administered medications prior to visit.     Per HPI unless specifically indicated in ROS section below Review of  Systems  Objective:  BP (!) 90/50 (BP Location: Left Arm, Cuff Size: Normal)   Pulse 88   Temp 98.7 F (37.1 C) (Oral)   Ht 5' 9 (1.753 m)   Wt 188 lb (85.3 kg)   SpO2 96%   BMI 27.76 kg/m   Wt Readings from Last 3 Encounters:  07/14/24 188 lb (85.3 kg)  07/07/24 190 lb (86.2 kg)  07/06/24 191 lb 5 oz (86.8 kg)      Physical Exam Vitals and nursing note reviewed.  Constitutional:      Appearance: Normal appearance. He is not ill-appearing.     Comments: Ambulates unassisted   HENT:     Head: Normocephalic and atraumatic.  Eyes:     General:        Right eye: No discharge.        Left eye: No discharge.     Extraocular Movements: Extraocular movements intact.     Conjunctiva/sclera: Conjunctivae normal.     Pupils: Pupils are equal, round, and reactive to light.  Cardiovascular:     Rate and Rhythm: Normal rate and regular rhythm.     Pulses: Normal pulses.     Heart sounds: Murmur (4/6 systolic USB) heard.  Pulmonary:     Effort: Pulmonary effort is normal. No respiratory distress.     Breath sounds: Normal breath sounds. No wheezing, rhonchi or rales.  Musculoskeletal:     Right lower leg: No edema.     Left lower leg: No edema.  Skin:    General: Skin is warm and dry.     Findings: No rash.  Neurological:     Mental Status: He is alert.  Psychiatric:        Mood and Affect: Mood normal.        Behavior: Behavior normal.       Results for orders placed or performed in visit on 07/11/24  CMP (Cancer Center only)   Collection Time: 07/11/24 10:00 AM  Result Value Ref Range   Sodium 139 135 - 145 mmol/L   Potassium 3.5 3.5 - 5.1 mmol/L   Chloride 107 98 - 111 mmol/L   CO2 21 (L) 22 - 32 mmol/L   Glucose, Bld 113 (H) 70 - 99 mg/dL   BUN 14 8 - 23 mg/dL   Creatinine 9.28 9.38 - 1.24 mg/dL   Calcium  8.0 (L) 8.9 - 10.3 mg/dL   Total Protein 5.5 (L) 6.5 - 8.1 g/dL   Albumin 3.1 (L) 3.5 - 5.0 g/dL   AST 36 15 - 41 U/L   ALT 21 0 -  44 U/L   Alkaline  Phosphatase 155 (H) 38 - 126 U/L   Total Bilirubin 0.6 0.0 - 1.2 mg/dL   GFR, Estimated >39 >39 mL/min   Anion gap 11 5 - 15  CBC with Differential (Cancer Center Only)   Collection Time: 07/11/24 10:00 AM  Result Value Ref Range   WBC Count 7.5 4.0 - 10.5 K/uL   RBC 2.94 (L) 4.22 - 5.81 MIL/uL   Hemoglobin 10.1 (L) 13.0 - 17.0 g/dL   HCT 69.9 (L) 60.9 - 47.9 %   MCV 102.0 (H) 80.0 - 100.0 fL   MCH 34.4 (H) 26.0 - 34.0 pg   MCHC 33.7 30.0 - 36.0 g/dL   RDW 84.5 88.4 - 84.4 %   Platelet Count 94 (L) 150 - 400 K/uL   nRBC 0.0 0.0 - 0.2 %   Neutrophils Relative % 77 %   Neutro Abs 5.7 1.7 - 7.7 K/uL   Lymphocytes Relative 12 %   Lymphs Abs 0.9 0.7 - 4.0 K/uL   Monocytes Relative 9 %   Monocytes Absolute 0.7 0.1 - 1.0 K/uL   Eosinophils Relative 0 %   Eosinophils Absolute 0.0 0.0 - 0.5 K/uL   Basophils Relative 1 %   Basophils Absolute 0.1 0.0 - 0.1 K/uL   Immature Granulocytes 1 %   Abs Immature Granulocytes 0.10 (H) 0.00 - 0.07 K/uL    Assessment & Plan:   Problem List Items Addressed This Visit   None    No orders of the defined types were placed in this encounter.   No orders of the defined types were placed in this encounter.   Patient Instructions  BP was very low today - hold metoprolol  at this time. Monitor blood pressures at home. Seek ER care if any new symptoms develop such as worsening chest pain, dizziness, passing out, shortness of breath or fever >100.4.  Increase water intake.  Keep oncology appointment next week.   Follow up plan: No follow-ups on file.  Anton Blas, MD   "

## 2024-07-14 NOTE — Patient Instructions (Addendum)
 BP was very low today - hold metoprolol  at this time. Monitor blood pressures at home. Seek ER care if any new symptoms develop such as worsening chest pain, dizziness, passing out, shortness of breath or fever >100.4.  Increase water intake.  Keep oncology appointment next week.

## 2024-07-15 ENCOUNTER — Encounter: Payer: Self-pay | Admitting: Family Medicine

## 2024-07-15 DIAGNOSIS — I959 Hypotension, unspecified: Secondary | ICD-10-CM | POA: Insufficient documentation

## 2024-07-15 NOTE — Assessment & Plan Note (Addendum)
 Chemo -related.  Continue close oncology f/u

## 2024-07-15 NOTE — Assessment & Plan Note (Signed)
 Lumbar MRI showed pathologic fracture at T12, L1, and L2.  Also with epidural extension of tumor into R>L ventral epidural space at L, and mod-severe R L5 foraminal stenosis due to degenerative changes.  Continues vicodin for pain control - caution with use in hypotension.

## 2024-07-15 NOTE — Assessment & Plan Note (Signed)
 Again noted on imaging. Will continue to monitor.   MELD 3.0: 10 at 07/03/2024  8:15 AM MELD-Na: 8 at 07/03/2024  8:15 AM Calculated from: Serum Creatinine: 0.78 mg/dL (Using min of 1 mg/dL) at 01/31/7972  1:84 AM Serum Sodium: 134 mmol/L at 07/03/2024  8:15 AM Total Bilirubin: 1.2 mg/dL at 02/02/7972  8:99 PM Serum Albumin: 3.7 g/dL (Using max of 3.5 g/dL) at 02/02/7972  8:99 PM INR(ratio): 1.1 at 07/01/2024  2:44 PM Age at listing (hypothetical): 56 years Sex: Male at 07/03/2024  8:15 AM

## 2024-07-15 NOTE — Assessment & Plan Note (Addendum)
 Lumbar MRI revealed mets to lumbar spine, sacrum, pelvis.  Primary neuroendocrine, ?prostate vs lung

## 2024-07-15 NOTE — Assessment & Plan Note (Addendum)
 Marked hypotension noted today in setting of recent BB commencement (started for exertional chest discomfort) - will hold metoprolol  at this time. Encouraged increased fluid intake, close BP monitoring over the weekend, and he has close oncology f/u scheduled for next week.  He is overall asymptomatic from this - reviewed signs to seek 911/ER care.

## 2024-07-15 NOTE — Assessment & Plan Note (Signed)
 Mod-severe on latest echo, pending repeat later this month.  Hold BB as per above.

## 2024-07-15 NOTE — Assessment & Plan Note (Addendum)
 Vit D insufficiency noted on latest check - treated with weekly 50k replacement x108mo

## 2024-07-15 NOTE — Assessment & Plan Note (Addendum)
 Appreciate oncology care, palliative care also involved.  S/p palliative radiation therapy Continues palliative chemo.

## 2024-07-17 ENCOUNTER — Ambulatory Visit
Admission: RE | Admit: 2024-07-17 | Discharge: 2024-07-17 | Disposition: A | Source: Ambulatory Visit | Attending: Internal Medicine

## 2024-07-17 ENCOUNTER — Inpatient Hospital Stay: Admitting: Internal Medicine

## 2024-07-17 ENCOUNTER — Inpatient Hospital Stay

## 2024-07-17 VITALS — BP 127/82 | HR 72 | Temp 98.2°F | Resp 16

## 2024-07-17 VITALS — BP 167/86 | HR 110 | Temp 97.8°F | Resp 17 | Ht 69.0 in | Wt 183.2 lb

## 2024-07-17 DIAGNOSIS — C3490 Malignant neoplasm of unspecified part of unspecified bronchus or lung: Secondary | ICD-10-CM | POA: Diagnosis not present

## 2024-07-17 DIAGNOSIS — C61 Malignant neoplasm of prostate: Secondary | ICD-10-CM | POA: Diagnosis not present

## 2024-07-17 DIAGNOSIS — C7A1 Malignant poorly differentiated neuroendocrine tumors: Secondary | ICD-10-CM

## 2024-07-17 LAB — CBC WITH DIFFERENTIAL (CANCER CENTER ONLY)
Abs Immature Granulocytes: 0.05 K/uL (ref 0.00–0.07)
Basophils Absolute: 0 K/uL (ref 0.0–0.1)
Basophils Relative: 0 %
Eosinophils Absolute: 0.2 K/uL (ref 0.0–0.5)
Eosinophils Relative: 3 %
HCT: 31.8 % — ABNORMAL LOW (ref 39.0–52.0)
Hemoglobin: 10.7 g/dL — ABNORMAL LOW (ref 13.0–17.0)
Immature Granulocytes: 1 %
Lymphocytes Relative: 14 %
Lymphs Abs: 0.8 K/uL (ref 0.7–4.0)
MCH: 34.1 pg — ABNORMAL HIGH (ref 26.0–34.0)
MCHC: 33.6 g/dL (ref 30.0–36.0)
MCV: 101.3 fL — ABNORMAL HIGH (ref 80.0–100.0)
Monocytes Absolute: 0.2 K/uL (ref 0.1–1.0)
Monocytes Relative: 3 %
Neutro Abs: 4.8 K/uL (ref 1.7–7.7)
Neutrophils Relative %: 79 %
Platelet Count: 168 K/uL (ref 150–400)
RBC: 3.14 MIL/uL — ABNORMAL LOW (ref 4.22–5.81)
RDW: 15.4 % (ref 11.5–15.5)
WBC Count: 6.1 K/uL (ref 4.0–10.5)
nRBC: 0 % (ref 0.0–0.2)

## 2024-07-17 LAB — CMP (CANCER CENTER ONLY)
ALT: 23 U/L (ref 0–44)
AST: 31 U/L (ref 15–41)
Albumin: 3.8 g/dL (ref 3.5–5.0)
Alkaline Phosphatase: 181 U/L — ABNORMAL HIGH (ref 38–126)
Anion gap: 11 (ref 5–15)
BUN: 17 mg/dL (ref 8–23)
CO2: 22 mmol/L (ref 22–32)
Calcium: 9.2 mg/dL (ref 8.9–10.3)
Chloride: 104 mmol/L (ref 98–111)
Creatinine: 0.74 mg/dL (ref 0.61–1.24)
GFR, Estimated: >60 mL/min
Glucose, Bld: 193 mg/dL — ABNORMAL HIGH (ref 70–99)
Potassium: 4 mmol/L (ref 3.5–5.1)
Sodium: 137 mmol/L (ref 135–145)
Total Bilirubin: 0.6 mg/dL (ref 0.0–1.2)
Total Protein: 6.6 g/dL (ref 6.5–8.1)

## 2024-07-17 MED ORDER — SODIUM CHLORIDE 0.9 % IV SOLN
75.0000 mg/m2 | Freq: Once | INTRAVENOUS | Status: AC
  Administered 2024-07-17: 160 mg via INTRAVENOUS
  Filled 2024-07-17: qty 16

## 2024-07-17 MED ORDER — DEXAMETHASONE SOD PHOSPHATE PF 10 MG/ML IJ SOLN
10.0000 mg | Freq: Once | INTRAMUSCULAR | Status: AC
  Administered 2024-07-17: 10 mg via INTRAVENOUS
  Filled 2024-07-17: qty 1

## 2024-07-17 MED ORDER — SODIUM CHLORIDE 0.9 % IV SOLN: INTRAVENOUS | Status: DC

## 2024-07-17 NOTE — Progress Notes (Signed)
 "     [facility] [phone]   [phone]  OFFICE PROGRESS NOTE  [name], MD [address] [place] [address]  DIAGNOSIS:  1) Metastatic poorly differentiated carcinoma, neuroendocrine carcinoma of suspicious prostate primary versus primary lung cancer presented with innumerable and bilateral pulmonary nodules in addition to right hilar and mediastinal lymphadenopathy and metastatic disease to several areas of the bone in addition to hypermetabolic activity in the prostate gland diagnosed in January 2024  2) prostate adenocarcinoma diagnosed in February 2024 currently managed by Dr. Nieves   PRIOR THERAPY:  1) Palliative systemic chemotherapy with carboplatin  for AUC of 5 and Imfinzi  1500 Mg IV on day 1 as well as etoposide  100 Mg/M2 on days 1, 2 and 3 with Neulasta  support on day 5. Status post 7 cycles. First dose on 08/11/22.  Starting from cycle #2 his carboplatin  will be reduced to AUC of 4 and 2 etoposide  80 Mg/M2 secondary to intolerance and cycle #1.  Starting from cycle #5 he will be on maintenance treatment with single agent Imfinzi  1500 Mg IV every 4 weeks. 2) palliative radiotherapy to the metastatic disease at the L1 vertebral body as well as the posterior iliac bones.  CURRENT THERAPY: Hormonal therapy with Eligard 30 mg intramuscular every 4 months in addition to Zytiga orally and docetaxel  75 mg/M2 every 3 weeks as well as prednisone  5 mg p.o. twice daily.  First dose June 26, 2024.  INTERVAL HISTORY: Larry Kent 79 y.o. male returns to the clinic today for follow-up visit.Discussed the use of AI scribe software for clinical note transcription with the patient, who gave verbal consent to proceed.  History of Present Illness Larry Kent is a 79 year old male with metastatic polydifferentiated carcinoma with neuroendocrine features, suspicious for prostate primary, currently receiving systemic therapy, who  presents for evaluation prior to cycle 2 of chemotherapy.  He is receiving Eligard 30 mg IM every four months, Zytiga 1000 mg PO daily, docetaxel  75 mg/m2, and prednisone  5 mg PO daily. The first cycle of docetaxel  was administered on June 26, 2024. He presents today for evaluation prior to the second cycle.  Following the initial chemotherapy cycle, he developed significant generalized pain in his legs, from the thighs downward, unrelated to his prior sciatica, beginning after Neulasta  administration. He also experienced a low-grade fever, with a maximum temperature of 100.72F. On the advice of the nurse line, he presented to the emergency department and was admitted from January 3rd to January 6th. Imaging, including CT of the abdomen and pelvis and MRI of the lumbar spine, did not reveal infection or other significant findings. Since discharge, he has managed post-injection symptoms with Claritin  and Vicodin, taking 3-4 tablets per 24 hours, spaced every 6-8 hours, with partial relief. He expresses some uncertainty regarding appropriate dosing and expected side effects.  He reports episodes of exertional dyspnea and mild discomfort after ambulation over longer distances. He was previously taken off metoprolol  by his cardiologist, but was recently restarted on a half tablet. After three to four days, he developed hypotension (blood pressure 98/50 mmHg on three occasions) and was advised to hold metoprolol  and monitor his heart rate. His blood pressure has since stabilized to 128-130/70-78 mmHg. He is maintaining a blood pressure log and has an echocardiogram scheduled for next Monday.     MEDICAL HISTORY: Past Medical History:  Diagnosis Date   Allergy    Anxiety    Arthritis    both  knees    BCC (basal cell carcinoma), arm, right 09/2019   MOHS ([place])   BCC (basal cell carcinoma), face 2014   L preauricular s/p MOHS   BCC (basal cell carcinoma), face 02/2018   L upper lip   Blood  transfusion without reported diagnosis    BPH (benign prostatic hypertrophy)    Dr. Nieves @ [facility]   Carotid stenosis 05/2015   RICA 40-59%, LICA 1-39%, L vertebral occlusion, rpt 1 yr   Cataract    removed both eyes    Chronic kidney disease    Diabetes mellitus without complication (HCC)    Emphysema of lung (HCC)    FHx: colon cancer    FHx: prostate cancer    GERD (gastroesophageal reflux disease)    Glaucoma    Heart murmur    mild aortic stenosis    History of kidney stones    Hyperlipidemia    borderline- on rosuvastatin  now normal    Lung cancer (HCC) 07/2022   Myocardial infarction (HCC) 09/07/2022   Neuromuscular disorder (HCC)    Occlusion of right vertebral artery 05/2015   Prostate cancer (HCC) 07/2022   Seizures (HCC)     ALLERGIES:  has no known allergies.  MEDICATIONS:  Current Outpatient Medications  Medication Sig Dispense Refill   abiraterone acetate (ZYTIGA) 250 MG tablet Take 1,000 mg by mouth daily.     Accu-Chek Softclix Lancets lancets SMARTSIG:Topical     aspirin  EC 81 MG tablet Take 1 tablet (81 mg total) by mouth daily. Swallow whole.     Blood Glucose Monitoring Suppl DEVI 1 each by Does not apply route in the morning, at noon, and at bedtime. E11.69. May substitute to any manufacturer covered by patient's insurance. 1 each 0   Calcium  Carbonate-Vit D-Min (CALCIUM  600+D PLUS MINERALS) 600-400 MG-UNIT TABS Take 600 mg by mouth daily. (Patient not taking: Reported on 07/07/2024)     CLINPRO 5000 1.1 % PSTE Place onto teeth. (Patient not taking: Reported on 07/14/2024)     clopidogrel  (PLAVIX ) 75 MG tablet TAKE 1 TABLET BY MOUTH EVERY DAY 90 tablet 2   dexamethasone  (DECADRON ) 4 MG tablet Take 2 tabs by mouth 2 times daily starting day before chemo. Then take 2 tabs daily for 2 days starting day after chemo. Take with food. 30 tablet 1   fluorouracil (EFUDEX) 5 % cream Apply topically 2 (two) times daily.     fluticasone  (FLONASE ) 50 MCG/ACT nasal  spray Place 1 spray into both nostrils daily as needed for allergies or rhinitis.     HYDROcodone -acetaminophen  (NORCO/VICODIN) 5-325 MG tablet Take 1-2 tablets by mouth every 6 (six) hours as needed for moderate pain (pain score 4-6). 60 tablet 0   Lancets Misc. (ACCU-CHEK SOFTCLIX LANCET DEV) KIT USE TO CHECK BLOOD SUGAR IN THE MORNING, AT NOON, AND AT BEDTIME     lidocaine -prilocaine  (EMLA ) cream Apply to affected area once 30 g 3   magic mouthwash (nystatin , diphenhydrAMINE , alum & mag hydroxide) suspension mixture Swish and spit 5 mLs 3 (three) times daily. (Patient not taking: Reported on 07/14/2024) 240 mL 1   metoprolol  tartrate (LOPRESSOR ) 25 MG tablet Take 0.5 tablets (12.5 mg total) by mouth 2 (two) times daily. 90 tablet 3   Multiple Vitamin (MULTIVITAMIN) tablet Take 1 tablet by mouth daily with breakfast. (Patient not taking: Reported on 07/07/2024)     nystatin  (MYCOSTATIN ) 100000 UNIT/ML suspension Take 5 mLs (500,000 Units total) by mouth 4 (four) times daily. (Patient not taking:  Reported on 07/07/2024) 60 mL 0   ondansetron  (ZOFRAN ) 8 MG tablet Take 1 tablet (8 mg total) by mouth every 8 (eight) hours as needed for nausea or vomiting. (Patient not taking: Reported on 07/07/2024) 30 tablet 1   pantoprazole  (PROTONIX ) 40 MG tablet Take 1 tablet (40 mg total) by mouth daily. 30 tablet 11   potassium chloride  (KLOR-CON ) 10 MEQ tablet Take 1 tablet (10 mEq total) by mouth as needed. (Patient not taking: Reported on 07/14/2024) 90 tablet 3   predniSONE  (DELTASONE ) 5 MG tablet Take 5 mg by mouth daily.     predniSONE  (DELTASONE ) 5 MG tablet Take 1 tablet (5 mg total) by mouth in the morning and at bedtime. (Patient not taking: Reported on 07/07/2024) 60 tablet 11   prochlorperazine  (COMPAZINE ) 10 MG tablet Take 1 tablet (10 mg total) by mouth every 6 (six) hours as needed for nausea or vomiting. (Patient not taking: Reported on 07/07/2024) 30 tablet 1   rosuvastatin  (CRESTOR ) 5 MG tablet TAKE 1 TABLET  (5 MG TOTAL) BY MOUTH DAILY. 90 tablet 1   tamsulosin  (FLOMAX ) 0.4 MG CAPS capsule Take 0.4 mg by mouth 2 (two) times daily.     Vitamin D , Ergocalciferol , (DRISDOL ) 1.25 MG (50000 UNIT) CAPS capsule Take 1 capsule (50,000 Units total) by mouth every 7 (seven) days. 12 capsule 0   No current facility-administered medications for this visit.    SURGICAL HISTORY:  Past Surgical History:  Procedure Laterality Date   BASAL CELL CARCINOMA EXCISION  05/2018   lip, 2020 x3  basal cells removed    CATARACT EXTRACTION  2008   Left   CATARACT EXTRACTION  2013   Right ([place])   COLONOSCOPY  ~2010   medium int hemorrhoids, o/w WNL, rpt 5 yrs given fmhx (Dr. Celestia)   COLONOSCOPY  08/2019   TAs, diverticulosis, rpt 3 yrs ([place])   COLONOSCOPY  12/2023   2TAs, diverticulosis, int hem, no f/u needed ([place])   CORONARY/GRAFT ACUTE MI REVASCULARIZATION N/A 09/07/2022   Procedure: Coronary/Graft Acute MI Revascularization;  Surgeon: Wendel Lurena POUR, MD;  Location: [facility];  Service: Cardiovascular;  Laterality: N/A;   ENDARTERECTOMY Right 12/01/2019   Procedure: RIGHT ENDARTERECTOMY CAROTID;  Surgeon: Gretta Lonni PARAS, MD;  Location: [facility] OR;  Service: Vascular;  Laterality: Right;   exercise treadmill  11/2005   WNL ([place])   FINE NEEDLE ASPIRATION  07/28/2022   Procedure: FINE NEEDLE ASPIRATION (FNA) LINEAR;  Surgeon: Brenna Adine CROME, DO;  Location: [facility] ENDOSCOPY;  Service: Pulmonary;;   HERNIA REPAIR  2008   Right   IR IMAGING GUIDED PORT INSERTION  08/11/2022   IR IMAGING GUIDED PORT INSERTION  10/08/2022   IR REMOVAL TUN ACCESS W/ PORT W/O FL MOD SED  09/10/2022   LEFT HEART CATH AND CORONARY ANGIOGRAPHY N/A 09/07/2022   Procedure: LEFT HEART CATH AND CORONARY ANGIOGRAPHY;  Surgeon: Wendel Lurena POUR, MD;  Location: [facility];  Service: Cardiovascular;  Laterality: N/A;   MOHS SURGERY Left spring 2014   basal cell face   PATCH ANGIOPLASTY Right 12/01/2019    Procedure: PATCH ANGIOPLASTY USING GEORGE BIOLOGIC PATCH;  Surgeon: Gretta Lonni PARAS, MD;  Location: [facility] OR;  Service: Vascular;  Laterality: Right;   Testicular Biopsy  2003   benign, varicocele   US  ECHOCARDIOGRAPHY  11/2005   aortic sclerosis, EF 55-60%, diastolic dysfunction   VIDEO BRONCHOSCOPY WITH ENDOBRONCHIAL ULTRASOUND Bilateral 07/28/2022   Procedure: VIDEO BRONCHOSCOPY WITH ENDOBRONCHIAL ULTRASOUND;  Surgeon:  Brenna Adine CROME, DO;  Location: [facility];  Service: Pulmonary;  Laterality: Bilateral;    REVIEW OF SYSTEMS:  Constitutional: positive for fatigue Eyes: negative Ears, nose, mouth, throat, and face: negative Respiratory: positive for dyspnea on exertion Cardiovascular: negative Gastrointestinal: negative Genitourinary:negative Integument/breast: negative Hematologic/lymphatic: negative Musculoskeletal:positive for arthralgias and bone pain Neurological: negative Behavioral/Psych: negative Endocrine: negative Allergic/Immunologic: negative   PHYSICAL EXAMINATION: General appearance: alert, cooperative, fatigued, and no distress Head: Normocephalic, without obvious abnormality, atraumatic Neck: no adenopathy, no JVD, supple, symmetrical, trachea midline, and thyroid  not enlarged, symmetric, no tenderness/mass/nodules Lymph nodes: Cervical, supraclavicular, and axillary nodes normal. Resp: clear to auscultation bilaterally Back: symmetric, no curvature. ROM normal. No CVA tenderness. Cardio: regular rate and rhythm, S1, S2 normal, no murmur, click, rub or gallop GI: soft, non-tender; bowel sounds normal; no masses,  no organomegaly Extremities: extremities normal, atraumatic, no cyanosis or edema Neurologic: Alert and oriented X 3, normal strength and tone. Normal symmetric reflexes. Normal coordination and gait  ECOG PERFORMANCE STATUS: 1 - Symptomatic but completely ambulatory  Blood pressure (!) 167/86, pulse (!) 110, temperature 97.8 F (36.6 C),  temperature source Temporal, resp. rate 17, height 5' 9 (1.753 m), weight 183 lb 3.2 oz (83.1 kg), SpO2 98%.  LABORATORY DATA: Lab Results  Component Value Date   WBC 6.1 07/17/2024   HGB 10.7 (L) 07/17/2024   HCT 31.8 (L) 07/17/2024   MCV 101.3 (H) 07/17/2024   PLT 168 07/17/2024      Chemistry      Component Value Date/Time   NA 139 07/11/2024 1000   NA 140 05/31/2023 1417   K 3.5 07/11/2024 1000   CL 107 07/11/2024 1000   CO2 21 (L) 07/11/2024 1000   BUN 14 07/11/2024 1000   BUN 14 05/31/2023 1417   CREATININE 0.71 07/11/2024 1000      Component Value Date/Time   CALCIUM  8.0 (L) 07/11/2024 1000   ALKPHOS 155 (H) 07/11/2024 1000   AST 36 07/11/2024 1000   ALT 21 07/11/2024 1000   BILITOT 0.6 07/11/2024 1000       RADIOGRAPHIC STUDIES: US  Venous Img Lower Bilateral (DVT) Result Date: 07/02/2024 CLINICAL DATA:  Lower extremity edema. EXAM: BILATERAL LOWER EXTREMITY VENOUS DOPPLER ULTRASOUND TECHNIQUE: Gray-scale sonography with graded compression, as well as color Doppler and duplex ultrasound were performed to evaluate the lower extremity deep venous systems from the level of the common femoral vein and including the common femoral, femoral, profunda femoral, popliteal and calf veins including the posterior tibial, peroneal and gastrocnemius veins when visible. The superficial great saphenous vein was also interrogated. Spectral Doppler was utilized to evaluate flow at rest and with distal augmentation maneuvers in the common femoral, femoral and popliteal veins. COMPARISON:  None Available. FINDINGS: RIGHT LOWER EXTREMITY Common Femoral Vein: No evidence of thrombus. Normal compressibility, respiratory phasicity and response to augmentation. Saphenofemoral Junction: No evidence of thrombus. Normal compressibility and flow on color Doppler imaging. Profunda Femoral Vein: No evidence of thrombus. Normal compressibility and flow on color Doppler imaging. Femoral Vein: No evidence  of thrombus. Normal compressibility, respiratory phasicity and response to augmentation. Popliteal Vein: No evidence of thrombus. Normal compressibility, respiratory phasicity and response to augmentation. Calf Veins: No evidence of thrombus. Normal compressibility and flow on color Doppler imaging. Superficial Great Saphenous Vein: No evidence of thrombus. Normal compressibility. Venous Reflux:  None. Other Findings: No evidence of superficial thrombophlebitis. Fluid collection in the right popliteal fossa measures roughly 3.5 x 4 x 1.5 cm and is  consistent with a Baker's cyst. LEFT LOWER EXTREMITY Common Femoral Vein: No evidence of thrombus. Normal compressibility, respiratory phasicity and response to augmentation. Saphenofemoral Junction: No evidence of thrombus. Normal compressibility and flow on color Doppler imaging. Profunda Femoral Vein: No evidence of thrombus. Normal compressibility and flow on color Doppler imaging. Femoral Vein: No evidence of thrombus. Normal compressibility, respiratory phasicity and response to augmentation. Popliteal Vein: No evidence of thrombus. Normal compressibility, respiratory phasicity and response to augmentation. Calf Veins: No evidence of thrombus. Normal compressibility and flow on color Doppler imaging. Superficial Great Saphenous Vein: No evidence of thrombus. Normal compressibility. Venous Reflux:  None. Other Findings: No evidence of superficial thrombophlebitis or abnormal fluid collection. IMPRESSION: 1. No evidence of deep venous thrombosis in either lower extremity. 2. Right popliteal fossa Baker's cyst. Electronically Signed   By: Marcey Moan M.D.   On: 07/02/2024 10:55   MR LUMBAR SPINE WO CONTRAST Result Date: 07/02/2024 CLINICAL DATA:  Initial evaluation for compression fracture. EXAM: MRI LUMBAR SPINE WITHOUT CONTRAST TECHNIQUE: Multiplanar, multisequence MR imaging of the lumbar spine was performed. No intravenous contrast was administered.  COMPARISON:  CT from 07/01/2024. FINDINGS: Segmentation: Standard. Lowest well-formed disc space labeled the L5-S1 level. Alignment: Mild dextroscoliosis. 3 mm retrolisthesis of L1 on L2, with trace 2 mm anterolisthesis of L5 on S1. Vertebrae: Multiple scattered marrow replacing lesions seen throughout the visualized lumbar spine, with involvement of the T12, L1, L2, and L5 vertebral bodies, consistent with metastatic disease. Extension to involve the posterior elements on the right at L1 and bilaterally at L5 and S1. Diffuse involvement of the visualized sacrum and posterior right greater than left iliac wings. Associated pathologic compression fracture at the inferior endplate of T12 with 40% height loss without significant retropulsion. Additional pathologic compression fracture involving the superior endplate of L1 with mild 10% height loss without retropulsion. Additional pathologic compression fracture at the superior endplate of L2 with up to 40% height loss without retropulsion. Vertebral body height otherwise maintained. Minimal epidural extension of tumor into the right greater than left ventral epidural space at the level of L1 (series 8, image 12). No other significant extra osseous or epidural extension of tumor. Conus medullaris and cauda equina: Conus extends to the L1 level. Conus and cauda equina appear normal. Paraspinal and other soft tissues: Paraspinous soft tissues demonstrate no acute finding. Few scattered T2 hyperintense cyst noted about the visualized kidneys, benign in appearance, no follow-up imaging recommended. Retroaortic left renal vein noted. Disc levels: T11-12: Mild disc bulge with endplate spurring. Right greater than left facet hypertrophy. No spinal stenosis. Mild to moderate bilateral foraminal narrowing, worse on the left. T12-L1: Mild right eccentric disc bulge with endplate spurring. Mild bilateral facet hypertrophy. No spinal stenosis. Foramina remain patent. L1-2:  Intervertebral disc space narrowing with trace retrolisthesis. Mild disc bulge with reactive endplate spurring, worse on the right. Mild facet hypertrophy. No significant spinal stenosis. Mild right L1 foraminal narrowing. Left neural foramina remains patent. L2-3: Degenerative vertebral disc space narrowing with diffuse disc bulge. Reactive endplate spurring. Disc bulging asymmetric to the left with superimposed left foraminal to extraforaminal disc protrusion (series 8, image 21). Mild bilateral facet hypertrophy. No significant spinal stenosis. Mild left L2 foraminal narrowing. Right neural foramen remains patent. L3-4: Degenerative intervertebral disc space narrowing with diffuse disc bulge, asymmetric to the left. Reactive endplate spurring, also greater on the left. Mild to moderate facet and ligament flavum hypertrophy. Resultant mild canal with moderate left lateral recess stenosis. Mild right  with moderate left L3 foraminal narrowing. L4-5: Disc desiccation with mild diffuse disc bulge. Reactive endplate spurring, worse on the left. Mild bilateral facet and ligament flavum hypertrophy. No significant spinal stenosis. Foramina remain patent. L5-S1: Degenerative intervertebral disc space narrowing with disc desiccation diffuse disc bulge. Reactive endplate spurring, worse on the right. Mild left with moderate right facet hypertrophy. Mild epidural lipomatosis. Resultant mild narrowing of the right lateral recess. Central canal remains patent. Moderate to severe right L5 foraminal stenosis. Left neural foramen remains patent. IMPRESSION: 1. Multifocal osseous metastases involving the lumbar spine as well as the visualized sacrum and pelvis. 2. Associated pathologic compression fractures involving the inferior endplate of T12 as well as the superior endplates of L1 and L2. Associated height loss of up to 40% without retropulsion. 3. Minimal epidural extension of tumor into the right greater than left ventral  epidural space at the level of L1. No other significant extra osseous or epidural extension of tumor. 4. Underlying multilevel degenerative spondylosis and facet arthrosis as above. Resultant mild canal with moderate left lateral recess stenosis at L3-4, with moderate left L3 foraminal narrowing. 5. Moderate to severe right L5 foraminal narrowing related to disc bulge, reactive endplate change, and facet degeneration Electronically Signed   By: Morene Hoard M.D.   On: 07/02/2024 00:54   CT ABDOMEN PELVIS W CONTRAST Result Date: 07/01/2024 CLINICAL DATA:  Lower abdominal pain, neutropenic fever. Patient on chemotherapy for prostate cancer with lung mass. EXAM: CT ABDOMEN AND PELVIS WITH CONTRAST TECHNIQUE: Multidetector CT imaging of the abdomen and pelvis was performed using the standard protocol following bolus administration of intravenous contrast. RADIATION DOSE REDUCTION: This exam was performed according to the departmental dose-optimization program which includes automated exposure control, adjustment of the mA and/or kV according to patient size and/or use of iterative reconstruction technique. CONTRAST:  OMNIPAQUE  IOHEXOL  300 MG/ML  SOLN COMPARISON:  04/25/2024, 04/11/2024. FINDINGS: Lower chest: The heart is mildly enlarged and multi-vessel coronary artery calcifications are noted. There is a small left pleural effusion with a associated atelectasis or infiltrate. Multiple nodules are identified at the right lung base, increased in number from the prior exam. The left lung base is obscured due to associated infiltrates. Hepatobiliary: No focal abnormality in the liver. The liver has a nodular contour suggesting underlying cirrhosis. Fatty infiltration is also seen. No biliary ductal dilatation. The gallbladder is without stones. Pancreas: Unremarkable. No pancreatic ductal dilatation or surrounding inflammatory changes. Spleen: Normal in size without focal abnormality. Adrenals/Urinary  Tract: The adrenal glands are within normal limits. The kidneys enhance symmetrically. A stable hypodensity is noted in the left kidney, suggesting cyst. No renal calculus or obstructive uropathy bilaterally. The bladder is stable. Stomach/Bowel: The stomach is within normal limits. No bowel obstruction, free air, or pneumatosis is seen. A moderate amount of retained stool is present in the colon. There is mild thickening of the walls of the transverse and descending colon. Appendix appears normal. Vascular/Lymphatic: Aortic atherosclerosis. No enlarged abdominal or pelvic lymph nodes. Reproductive: Prostate is unremarkable. Other: Rim calcified structures are seen in the pelvis suggesting fat necrosis. There is trace ascites in the right pericolic gutter. Musculoskeletal: Degenerative changes are present in the thoracolumbar spine. Sclerotic changes are noted in the visualized thoracolumbar spine, sacrum, and pelvis. A lytic lesion with bony discontinuity is noted in the inferior pubic ramus on the left with likely associated soft tissue component. There is a new compression deformity in the superior endplate at L2 with loss of  vertebral body height of 10% centrally, likely pathologic fracture. IMPRESSION: 1. Mild colonic wall thickening involving the transverse and descending colon, possible colitis. 2. Increased airspace disease at the left lung base, possible atelectasis or infiltrate. 3. Small left pleural effusion. 4. Osteoblastic metastasis in the thoracolumbar spine, sacrum, and pelvis. A new compression deformity in the superior endplate at L2, likely pathologic fracture. 5. Increased number of pulmonary nodules, suspicious for metastatic disease. 6. Hepatic steatosis with morphologic changes in the liver suggestive of cirrhosis. 7. Aortic atherosclerosis and coronary artery calcifications. Electronically Signed   By: Leita Birmingham M.D.   On: 07/01/2024 16:53   DG Chest Port 1 View Result Date:  07/01/2024 CLINICAL DATA:  Fever, chemotherapy patient. EXAM: PORTABLE CHEST 1 VIEW COMPARISON:  11/02/2022, 04/11/2024. FINDINGS: The heart is borderline enlarged and the mediastinal contour is within normal limits. There is atherosclerotic calcification of the aorta. Interstitial prominence is noted bilaterally. A few scattered nodular densities are seen bilaterally. There are suspected trace bilateral pleural effusions. No pneumothorax is seen. A left chest port terminates in the right atrium. IMPRESSION: 1. Interstitial prominence bilaterally with scattered nodular opacities, unchanged from the previous exam. 2. Suspected small bilateral pleural effusions. Electronically Signed   By: Leita Birmingham M.D.   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  Starting from cycle #5 the patient will be on maintenance treatment with Imfinzi  1500 Mg IV every 4 weeks.  His treatment was discontinued secondary to liver dysfunction. The patient is currently on observation and he is feeling fine with no concerning complaints.  He had repeat CT scan of the chest, abdomen and pelvis performed recently. His scan showed no concerning findings for disease progression. He had PMS a PET scan performed recently and it showed widespread PSMA avid osseous metastasis involving  the sacrum, bilateral posterior iliac veins, right inferior pubic ramus, L1 vertebral body well as left T6 transverse process but no DMSA avid thoracic lymph nodes and a stable small pulmonary nodules without PSMA avid tumor and small left pleural effusion. He is currently undergoing treatment with docetaxel  75 mg/M2, Zytiga 1000 mg daily and prednisone  5 mg p.o. twice daily for the progressive prostate cancer status post 1 cycle. He tolerated the first cycle of his treatment except for the aching pain and myalgia after the Neulasta  injection. Assessment and Plan Assessment & Plan Metastatic prostate carcinoma with neuroendocrine features He is undergoing multi-agent therapy with Eligard, Zytiga, docetaxel , and prednisone . Pre-cycle 2 evaluation showed blood work adequate for treatment. Cardiovascular status is closely managed in collaboration with his cardiologist due to recent hypotension and medication adjustments. He ambulates independently but reports mild exertional dyspnea and discomfort with prolonged walking. Ongoing coordination with his cardiologist regarding metoprolol  and a scheduled echocardiogram continues. - Cleared for chemotherapy based on current laboratory results. - Continued Eligard 30 mg IM every four months. -  Continued Zytiga 1000 mg PO daily. - Continued docetaxel  75 mg/m2 and prednisone  5 mg PO daily per protocol. - Instructed him to monitor blood pressure at home and maintain a log. - Reinforced collaboration with cardiologist regarding metoprolol  and upcoming echocardiogram. - Scheduled follow-up in three weeks.  Adverse effects of chemotherapy and supportive drugs He experienced significant musculoskeletal pain and low-grade fever following Neulasta  injection after the first chemotherapy cycle, requiring hospital admission for evaluation of possible infection, which was excluded. Symptoms have been managed with pain medication and Claritin , and he has improved  understanding of expected side effects. He remains at risk for recurrent adverse effects with subsequent cycles; alternative strategies may be considered if symptoms persist. - Advised proactive use of Vicodin and Claritin  after Neulasta  injection to manage musculoskeletal pain and flu-like symptoms. - Discussed option to reduce chemotherapy dose and omit Neulasta  in future cycles if adverse effects persist. The patient was advised to call immediately if he has any concerning symptoms in the interval.  The patient voices understanding of current disease status and treatment options and is in agreement with the current care plan.  All questions were answered. The patient knows to call the clinic with any problems, questions or concerns. We can certainly see the patient much sooner if necessary.  The total time spent in the appointment was 30 minutes including review of chart and various tests results, discussions about plan of care and coordination of care plan .   Disclaimer: This note was dictated with voice recognition software. Similar sounding words can inadvertently be transcribed and may not be corrected upon review.        "

## 2024-07-17 NOTE — Patient Instructions (Signed)
 [facility] - A DEPT OF [facility]  Discharge Instructions: Thank you for choosing [facility] to provide your oncology and hematology care.   If you have a lab appointment with the Cancer Center, please go directly to the Cancer Center and check in at the registration area.   Wear comfortable clothing and clothing appropriate for easy access to any Portacath or PICC line.   We strive to give you quality time with your provider. You may need to reschedule your appointment if you arrive late (15 or more minutes).  Arriving late affects you and other patients whose appointments are after yours.  Also, if you miss three or more appointments without notifying the office, you may be dismissed from the clinic at the provider's discretion.      For prescription refill requests, have your pharmacy contact our office and allow 72 hours for refills to be completed.    Today you received the following chemotherapy and/or immunotherapy agents: Docetaxel      To help prevent nausea and vomiting after your treatment, we encourage you to take your nausea medication as directed.  BELOW ARE SYMPTOMS THAT SHOULD BE REPORTED IMMEDIATELY: *FEVER GREATER THAN 100.4 F (38 C) OR HIGHER *CHILLS OR SWEATING *NAUSEA AND VOMITING THAT IS NOT CONTROLLED WITH YOUR NAUSEA MEDICATION *UNUSUAL SHORTNESS OF BREATH *UNUSUAL BRUISING OR BLEEDING *URINARY PROBLEMS (pain or burning when urinating, or frequent urination) *BOWEL PROBLEMS (unusual diarrhea, constipation, pain near the anus) TENDERNESS IN MOUTH AND THROAT WITH OR WITHOUT PRESENCE OF ULCERS (sore throat, sores in mouth, or a toothache) UNUSUAL RASH, SWELLING OR PAIN  UNUSUAL VAGINAL DISCHARGE OR ITCHING   Items with * indicate a potential emergency and should be followed up as soon as possible or go to the Emergency Department if any problems should occur.  Please show the CHEMOTHERAPY ALERT CARD or IMMUNOTHERAPY  ALERT CARD at check-in to the Emergency Department and triage nurse.  Should you have questions after your visit or need to cancel or reschedule your appointment, please contact [facility] - A DEPT OF [facility]  Dept: [phone]  and follow the prompts.  Office hours are 8:00 a.m. to 4:30 p.m. Monday - Friday. Please note that voicemails left after 4:00 p.m. may not be returned until the following business day.  We are closed weekends and major holidays. You have access to a nurse at all times for urgent questions. Please call the main number to the clinic Dept: [phone] and follow the prompts.   For any non-urgent questions, you may also contact your provider using MyChart. We now offer e-Visits for anyone 58 and older to request care online for non-urgent symptoms. For details visit [email or web address].   Also download the MyChart app! Go to the app store, search "MyChart", open the app, select , and log in with your MyChart username and password.`

## 2024-07-17 NOTE — Progress Notes (Signed)
 Pt called and left NN a VM to confirm if he will need his udenyca  injection on Wednesday.  NN reached to Dr Sherrod via [facility]. Per Dr Sherrod, the pt will need his injection.  Nn called pt at this time to let him know that he does still need his injection. Pt verbalized understanding.

## 2024-07-18 ENCOUNTER — Telehealth: Payer: Self-pay

## 2024-07-18 ENCOUNTER — Other Ambulatory Visit: Payer: Self-pay

## 2024-07-18 DIAGNOSIS — C3490 Malignant neoplasm of unspecified part of unspecified bronchus or lung: Secondary | ICD-10-CM

## 2024-07-18 NOTE — Telephone Encounter (Signed)
 Copied from CRM [id number]. Topic: Clinical - Home Health Verbal Orders >> Jul 18, 2024 12:57 PM Macario HERO wrote: Caller/Agency: Zachara Occupational Therapist from [facility]  Callback Number: [phone] Service Requested: Occupational Therapy Frequency: 1 week 5 Any new concerns about the patient? No

## 2024-07-18 NOTE — Telephone Encounter (Signed)
 Agree with this. Thanks.

## 2024-07-19 ENCOUNTER — Inpatient Hospital Stay

## 2024-07-19 VITALS — BP 129/70 | HR 79 | Temp 98.5°F | Resp 17

## 2024-07-19 DIAGNOSIS — C3490 Malignant neoplasm of unspecified part of unspecified bronchus or lung: Secondary | ICD-10-CM

## 2024-07-19 DIAGNOSIS — C61 Malignant neoplasm of prostate: Secondary | ICD-10-CM | POA: Diagnosis not present

## 2024-07-19 DIAGNOSIS — C7A1 Malignant poorly differentiated neuroendocrine tumors: Secondary | ICD-10-CM

## 2024-07-19 LAB — CBC WITH DIFFERENTIAL (CANCER CENTER ONLY)
Abs Immature Granulocytes: 0.04 K/uL (ref 0.00–0.07)
Basophils Absolute: 0 K/uL (ref 0.0–0.1)
Basophils Relative: 0 %
Eosinophils Absolute: 0 K/uL (ref 0.0–0.5)
Eosinophils Relative: 0 %
HCT: 31.2 % — ABNORMAL LOW (ref 39.0–52.0)
Hemoglobin: 10.9 g/dL — ABNORMAL LOW (ref 13.0–17.0)
Immature Granulocytes: 1 %
Lymphocytes Relative: 6 %
Lymphs Abs: 0.4 K/uL — ABNORMAL LOW (ref 0.7–4.0)
MCH: 35.3 pg — ABNORMAL HIGH (ref 26.0–34.0)
MCHC: 34.9 g/dL (ref 30.0–36.0)
MCV: 101 fL — ABNORMAL HIGH (ref 80.0–100.0)
Monocytes Absolute: 0.2 K/uL (ref 0.1–1.0)
Monocytes Relative: 3 %
Neutro Abs: 6 K/uL (ref 1.7–7.7)
Neutrophils Relative %: 90 %
Platelet Count: 152 K/uL (ref 150–400)
RBC: 3.09 MIL/uL — ABNORMAL LOW (ref 4.22–5.81)
RDW: 15.3 % (ref 11.5–15.5)
WBC Count: 6.6 K/uL (ref 4.0–10.5)
nRBC: 0 % (ref 0.0–0.2)

## 2024-07-19 LAB — CMP (CANCER CENTER ONLY)
ALT: 26 U/L (ref 0–44)
AST: 34 U/L (ref 15–41)
Albumin: 3.7 g/dL (ref 3.5–5.0)
Alkaline Phosphatase: 158 U/L — ABNORMAL HIGH (ref 38–126)
Anion gap: 12 (ref 5–15)
BUN: 30 mg/dL — ABNORMAL HIGH (ref 8–23)
CO2: 21 mmol/L — ABNORMAL LOW (ref 22–32)
Calcium: 8.4 mg/dL — ABNORMAL LOW (ref 8.9–10.3)
Chloride: 102 mmol/L (ref 98–111)
Creatinine: 0.72 mg/dL (ref 0.61–1.24)
GFR, Estimated: >60 mL/min
Glucose, Bld: 196 mg/dL — ABNORMAL HIGH (ref 70–99)
Potassium: 4.6 mmol/L (ref 3.5–5.1)
Sodium: 134 mmol/L — ABNORMAL LOW (ref 135–145)
Total Bilirubin: 0.8 mg/dL (ref 0.0–1.2)
Total Protein: 6.2 g/dL — ABNORMAL LOW (ref 6.5–8.1)

## 2024-07-19 MED ORDER — PEGFILGRASTIM-CBQV 6 MG/0.6ML ~~LOC~~ SOSY
6.0000 mg | PREFILLED_SYRINGE | Freq: Once | SUBCUTANEOUS | Status: AC
  Administered 2024-07-19: 6 mg via SUBCUTANEOUS
  Filled 2024-07-19: qty 0.6

## 2024-07-19 NOTE — Telephone Encounter (Signed)
 Called no able to leave VM   Please provide message from provider/office when call is returned from patient.

## 2024-07-19 NOTE — Telephone Encounter (Signed)
 Lvm asking Zachara, OT with [facility], to call back. Pls advise her Dr KANDICE is giving verbal orders for services requested for pt.

## 2024-07-20 ENCOUNTER — Telehealth: Payer: Self-pay

## 2024-07-20 ENCOUNTER — Other Ambulatory Visit: Payer: Self-pay

## 2024-07-20 DIAGNOSIS — B370 Candidal stomatitis: Secondary | ICD-10-CM

## 2024-07-20 DIAGNOSIS — C61 Malignant neoplasm of prostate: Secondary | ICD-10-CM

## 2024-07-20 MED ORDER — NYSTATIN 100000 UNIT/ML MT SUSP: 5.0000 mL | Freq: Three times a day (TID) | OROMUCOSAL | 1 refills | Status: AC

## 2024-07-20 NOTE — Telephone Encounter (Signed)
 This is third time calling number no call back ok to close encounter until they reach back out?

## 2024-07-20 NOTE — Telephone Encounter (Signed)
 Pt called to request a refill on Magic Mouthwash due to thrush.

## 2024-07-20 NOTE — Telephone Encounter (Signed)
 Left message to return call to our office.

## 2024-07-24 ENCOUNTER — Ambulatory Visit

## 2024-07-26 ENCOUNTER — Telehealth: Payer: Self-pay | Admitting: Medical Oncology

## 2024-07-26 NOTE — Telephone Encounter (Signed)
 Patient reports PSA result elevated at 30. Dr Serge nurse gave him the result. The lab was drawn approximately two weeks ago.   Patient states he was informed that Dr. Serge nurse would forward the results to Dr. Sherrod; however, no PSA results are currently available in EPIC.   Patient expresses significant concern regarding this result. I will discuss this with Dr. Sherrod. Patient was advised to keep his scheduled appointment on 2/9.

## 2024-07-27 NOTE — Progress Notes (Signed)
 RN was notified of patient's recent PSA results from [facility].  Information forwarded to Dr. Sherrod.  Will have copy of results and last office visit from [facility] scanned into system.   Patient had PSA drawn on 1/14 with results of 30.8.  Previous PSA 03/13/24 with results of 7.9.    Patient is under active treatment for his polydifferentiated carcinoma with neuroendocrine features, suspicious for prostate primary, currently receiving systemic therapy.  S/P 2 cycles of Taxotere  and Eligard 30 mg IM every four months, Zytiga 1000 mg daily.

## 2024-07-28 ENCOUNTER — Encounter: Payer: Self-pay | Admitting: Urology

## 2024-08-02 ENCOUNTER — Other Ambulatory Visit: Payer: Self-pay | Admitting: Cardiovascular Disease

## 2024-08-02 ENCOUNTER — Ambulatory Visit

## 2024-08-02 ENCOUNTER — Encounter: Payer: Self-pay | Admitting: Internal Medicine

## 2024-08-02 DIAGNOSIS — I679 Cerebrovascular disease, unspecified: Secondary | ICD-10-CM

## 2024-08-02 NOTE — Telephone Encounter (Signed)
 Lipid Panel within 12 months  01/25/24

## 2024-08-03 ENCOUNTER — Ambulatory Visit

## 2024-08-03 ENCOUNTER — Ambulatory Visit: Payer: Self-pay | Admitting: Cardiovascular Disease

## 2024-08-03 DIAGNOSIS — I350 Nonrheumatic aortic (valve) stenosis: Secondary | ICD-10-CM

## 2024-08-03 LAB — ECHOCARDIOGRAM COMPLETE
AR max vel: 1.4 cm2
AV Area VTI: 1.44 cm2
AV Area mean vel: 1.4 cm2
AV Mean grad: 16.3 mmHg
AV Peak grad: 30 mmHg
Ao pk vel: 2.74 m/s
Area-P 1/2: 3.99 cm2
Calc EF: 48.9 %
MV VTI: 1.63 cm2
S' Lateral: 3.9 cm
Single Plane A2C EF: 47.2 %
Single Plane A4C EF: 50.8 %

## 2024-08-07 ENCOUNTER — Inpatient Hospital Stay

## 2024-08-07 ENCOUNTER — Inpatient Hospital Stay: Attending: Internal Medicine | Admitting: Internal Medicine

## 2024-08-07 ENCOUNTER — Inpatient Hospital Stay: Admitting: Nurse Practitioner

## 2024-08-09 ENCOUNTER — Inpatient Hospital Stay

## 2024-08-28 ENCOUNTER — Inpatient Hospital Stay: Admitting: Internal Medicine

## 2024-08-28 ENCOUNTER — Inpatient Hospital Stay: Attending: Internal Medicine

## 2024-08-28 ENCOUNTER — Inpatient Hospital Stay

## 2024-08-30 ENCOUNTER — Inpatient Hospital Stay

## 2024-09-18 ENCOUNTER — Inpatient Hospital Stay

## 2024-09-18 ENCOUNTER — Inpatient Hospital Stay: Admitting: Physician Assistant

## 2024-09-20 ENCOUNTER — Inpatient Hospital Stay

## 2024-10-09 ENCOUNTER — Ambulatory Visit
